# Patient Record
Sex: Female | Born: 1940
Health system: Southern US, Community
[De-identification: ages and names within clinical notes are randomized; demographics above are authoritative.]

## PROBLEM LIST (undated history)

## (undated) DIAGNOSIS — S22079A Unspecified fracture of T9-T10 vertebra, initial encounter for closed fracture: Secondary | ICD-10-CM

## (undated) DIAGNOSIS — I509 Heart failure, unspecified: Secondary | ICD-10-CM

## (undated) DIAGNOSIS — R0600 Dyspnea, unspecified: Secondary | ICD-10-CM

## (undated) DIAGNOSIS — C801 Malignant (primary) neoplasm, unspecified: Secondary | ICD-10-CM

## (undated) DIAGNOSIS — K219 Gastro-esophageal reflux disease without esophagitis: Secondary | ICD-10-CM

## (undated) DIAGNOSIS — I10 Essential (primary) hypertension: Secondary | ICD-10-CM

## (undated) DIAGNOSIS — D649 Anemia, unspecified: Secondary | ICD-10-CM

## (undated) DIAGNOSIS — I4891 Unspecified atrial fibrillation: Secondary | ICD-10-CM

## (undated) DIAGNOSIS — F419 Anxiety disorder, unspecified: Secondary | ICD-10-CM

## (undated) DIAGNOSIS — M1990 Unspecified osteoarthritis, unspecified site: Secondary | ICD-10-CM

## (undated) DIAGNOSIS — N189 Chronic kidney disease, unspecified: Secondary | ICD-10-CM

## (undated) HISTORY — PX: MASTECTOMY: SHX3

---

## 1993-03-26 HISTORY — PX: SEPTOPLASTY: SUR1290

## 1999-03-27 DIAGNOSIS — C50919 Malignant neoplasm of unspecified site of unspecified female breast: Secondary | ICD-10-CM

## 1999-03-27 HISTORY — DX: Malignant neoplasm of unspecified site of unspecified female breast: C50.919

## 2000-01-17 ENCOUNTER — Encounter: Admission: RE | Admit: 2000-01-17 | Discharge: 2000-01-17 | Payer: Self-pay | Admitting: Oncology

## 2000-01-17 ENCOUNTER — Encounter (HOSPITAL_COMMUNITY): Payer: Self-pay | Admitting: Oncology

## 2000-01-17 ENCOUNTER — Encounter (INDEPENDENT_AMBULATORY_CARE_PROVIDER_SITE_OTHER): Payer: Self-pay | Admitting: *Deleted

## 2000-01-17 ENCOUNTER — Other Ambulatory Visit: Admission: RE | Admit: 2000-01-17 | Discharge: 2000-01-17 | Payer: Self-pay | Admitting: Oncology

## 2000-01-31 ENCOUNTER — Inpatient Hospital Stay (HOSPITAL_COMMUNITY): Admission: RE | Admit: 2000-01-31 | Discharge: 2000-02-01 | Payer: Self-pay | Admitting: *Deleted

## 2000-01-31 ENCOUNTER — Encounter (INDEPENDENT_AMBULATORY_CARE_PROVIDER_SITE_OTHER): Payer: Self-pay | Admitting: *Deleted

## 2000-01-31 ENCOUNTER — Encounter: Payer: Self-pay | Admitting: *Deleted

## 2000-03-05 ENCOUNTER — Encounter: Payer: Self-pay | Admitting: *Deleted

## 2000-03-05 ENCOUNTER — Ambulatory Visit (HOSPITAL_COMMUNITY): Admission: RE | Admit: 2000-03-05 | Discharge: 2000-03-05 | Payer: Self-pay | Admitting: *Deleted

## 2000-04-17 ENCOUNTER — Encounter (HOSPITAL_COMMUNITY): Payer: Self-pay | Admitting: Oncology

## 2000-04-17 ENCOUNTER — Encounter: Admission: RE | Admit: 2000-04-17 | Discharge: 2000-04-17 | Payer: Self-pay | Admitting: Oncology

## 2000-07-03 ENCOUNTER — Encounter (HOSPITAL_COMMUNITY): Admission: RE | Admit: 2000-07-03 | Discharge: 2000-08-02 | Payer: Self-pay | Admitting: Oncology

## 2000-07-03 ENCOUNTER — Encounter: Admission: RE | Admit: 2000-07-03 | Discharge: 2000-07-03 | Payer: Self-pay | Admitting: Oncology

## 2000-08-02 ENCOUNTER — Ambulatory Visit (HOSPITAL_BASED_OUTPATIENT_CLINIC_OR_DEPARTMENT_OTHER): Admission: RE | Admit: 2000-08-02 | Discharge: 2000-08-02 | Payer: Self-pay | Admitting: *Deleted

## 2000-09-25 ENCOUNTER — Encounter: Admission: RE | Admit: 2000-09-25 | Discharge: 2000-09-25 | Payer: Self-pay | Admitting: Oncology

## 2000-09-25 ENCOUNTER — Encounter (HOSPITAL_COMMUNITY): Admission: RE | Admit: 2000-09-25 | Discharge: 2000-10-25 | Payer: Self-pay | Admitting: Oncology

## 2001-03-05 ENCOUNTER — Encounter (HOSPITAL_COMMUNITY): Admission: RE | Admit: 2001-03-05 | Discharge: 2001-04-04 | Payer: Self-pay | Admitting: Oncology

## 2001-03-05 ENCOUNTER — Encounter: Admission: RE | Admit: 2001-03-05 | Discharge: 2001-03-05 | Payer: Self-pay | Admitting: Oncology

## 2001-09-03 ENCOUNTER — Encounter (HOSPITAL_COMMUNITY): Admission: RE | Admit: 2001-09-03 | Discharge: 2001-10-03 | Payer: Self-pay | Admitting: Oncology

## 2001-09-03 ENCOUNTER — Encounter: Admission: RE | Admit: 2001-09-03 | Discharge: 2001-09-03 | Payer: Self-pay | Admitting: Family Medicine

## 2001-10-07 ENCOUNTER — Encounter: Admission: RE | Admit: 2001-10-07 | Discharge: 2001-10-07 | Payer: Self-pay | Admitting: Oncology

## 2002-03-11 ENCOUNTER — Encounter (HOSPITAL_COMMUNITY): Admission: RE | Admit: 2002-03-11 | Discharge: 2002-04-10 | Payer: Self-pay | Admitting: Oncology

## 2002-03-11 ENCOUNTER — Encounter: Admission: RE | Admit: 2002-03-11 | Discharge: 2002-03-11 | Payer: Self-pay | Admitting: Oncology

## 2002-04-28 ENCOUNTER — Ambulatory Visit (HOSPITAL_COMMUNITY): Admission: RE | Admit: 2002-04-28 | Discharge: 2002-04-28 | Payer: Self-pay | Admitting: Oncology

## 2002-04-28 ENCOUNTER — Encounter (HOSPITAL_COMMUNITY): Payer: Self-pay | Admitting: Oncology

## 2002-09-14 ENCOUNTER — Encounter (HOSPITAL_COMMUNITY): Admission: RE | Admit: 2002-09-14 | Discharge: 2002-10-14 | Payer: Self-pay | Admitting: Oncology

## 2002-09-14 ENCOUNTER — Encounter: Admission: RE | Admit: 2002-09-14 | Discharge: 2002-09-14 | Payer: Self-pay | Admitting: Oncology

## 2002-10-23 ENCOUNTER — Other Ambulatory Visit: Admission: RE | Admit: 2002-10-23 | Discharge: 2002-10-23 | Payer: Self-pay | Admitting: Obstetrics & Gynecology

## 2003-02-08 ENCOUNTER — Ambulatory Visit (HOSPITAL_COMMUNITY): Admission: RE | Admit: 2003-02-08 | Discharge: 2003-02-08 | Payer: Self-pay | Admitting: General Surgery

## 2003-03-16 ENCOUNTER — Encounter: Admission: RE | Admit: 2003-03-16 | Discharge: 2003-03-16 | Payer: Self-pay | Admitting: Oncology

## 2003-03-16 ENCOUNTER — Encounter (HOSPITAL_COMMUNITY): Admission: RE | Admit: 2003-03-16 | Discharge: 2003-04-15 | Payer: Self-pay | Admitting: Oncology

## 2003-10-12 ENCOUNTER — Ambulatory Visit (HOSPITAL_COMMUNITY): Admission: RE | Admit: 2003-10-12 | Discharge: 2003-10-12 | Payer: Self-pay | Admitting: Family Medicine

## 2003-10-25 ENCOUNTER — Encounter: Admission: RE | Admit: 2003-10-25 | Discharge: 2003-10-25 | Payer: Self-pay | Admitting: Oncology

## 2003-10-25 ENCOUNTER — Encounter (HOSPITAL_COMMUNITY): Admission: RE | Admit: 2003-10-25 | Discharge: 2003-11-24 | Payer: Self-pay | Admitting: Oncology

## 2004-01-10 ENCOUNTER — Ambulatory Visit (HOSPITAL_COMMUNITY): Admission: RE | Admit: 2004-01-10 | Discharge: 2004-01-10 | Payer: Self-pay | Admitting: Family Medicine

## 2004-05-17 ENCOUNTER — Encounter: Admission: RE | Admit: 2004-05-17 | Discharge: 2004-05-17 | Payer: Self-pay | Admitting: Oncology

## 2004-05-17 ENCOUNTER — Encounter (HOSPITAL_COMMUNITY): Admission: RE | Admit: 2004-05-17 | Discharge: 2004-06-16 | Payer: Self-pay | Admitting: Oncology

## 2004-05-17 ENCOUNTER — Ambulatory Visit (HOSPITAL_COMMUNITY): Payer: Self-pay | Admitting: Oncology

## 2004-07-31 ENCOUNTER — Ambulatory Visit (HOSPITAL_COMMUNITY): Admission: RE | Admit: 2004-07-31 | Discharge: 2004-07-31 | Payer: Self-pay | Admitting: Oncology

## 2004-11-15 ENCOUNTER — Encounter: Admission: RE | Admit: 2004-11-15 | Discharge: 2004-11-15 | Payer: Self-pay | Admitting: Oncology

## 2004-11-15 ENCOUNTER — Encounter (HOSPITAL_COMMUNITY): Admission: RE | Admit: 2004-11-15 | Discharge: 2004-12-15 | Payer: Self-pay | Admitting: Oncology

## 2004-11-15 ENCOUNTER — Ambulatory Visit (HOSPITAL_COMMUNITY): Payer: Self-pay | Admitting: Oncology

## 2005-06-22 ENCOUNTER — Ambulatory Visit (HOSPITAL_COMMUNITY): Payer: Self-pay | Admitting: Oncology

## 2005-06-22 ENCOUNTER — Encounter (HOSPITAL_COMMUNITY): Admission: RE | Admit: 2005-06-22 | Discharge: 2005-07-22 | Payer: Self-pay | Admitting: Oncology

## 2005-06-22 ENCOUNTER — Encounter: Admission: RE | Admit: 2005-06-22 | Discharge: 2005-06-22 | Payer: Self-pay | Admitting: Oncology

## 2005-12-03 ENCOUNTER — Emergency Department (HOSPITAL_COMMUNITY): Admission: EM | Admit: 2005-12-03 | Discharge: 2005-12-04 | Payer: Self-pay | Admitting: Emergency Medicine

## 2008-02-27 ENCOUNTER — Emergency Department (HOSPITAL_COMMUNITY): Admission: EM | Admit: 2008-02-27 | Discharge: 2008-02-28 | Payer: Self-pay | Admitting: Emergency Medicine

## 2009-03-28 ENCOUNTER — Ambulatory Visit (HOSPITAL_COMMUNITY): Admission: RE | Admit: 2009-03-28 | Discharge: 2009-03-28 | Payer: Self-pay | Admitting: Family Medicine

## 2009-04-07 ENCOUNTER — Ambulatory Visit (HOSPITAL_COMMUNITY): Admission: RE | Admit: 2009-04-07 | Discharge: 2009-04-07 | Payer: Self-pay | Admitting: Family Medicine

## 2010-04-16 ENCOUNTER — Encounter (HOSPITAL_COMMUNITY): Payer: Self-pay | Admitting: Oncology

## 2010-07-05 ENCOUNTER — Ambulatory Visit (HOSPITAL_COMMUNITY)
Admission: RE | Admit: 2010-07-05 | Discharge: 2010-07-05 | Disposition: A | Discharge status: Home / Self Care | Payer: Medicare HMO | Source: Ambulatory Visit | Attending: Family Medicine | Admitting: Family Medicine

## 2010-07-05 ENCOUNTER — Other Ambulatory Visit (HOSPITAL_COMMUNITY): Payer: Self-pay | Admitting: Family Medicine

## 2010-07-05 DIAGNOSIS — M25561 Pain in right knee: Secondary | ICD-10-CM

## 2010-07-05 DIAGNOSIS — M25559 Pain in unspecified hip: Secondary | ICD-10-CM | POA: Insufficient documentation

## 2010-07-05 DIAGNOSIS — M25551 Pain in right hip: Secondary | ICD-10-CM

## 2010-07-05 DIAGNOSIS — M25569 Pain in unspecified knee: Secondary | ICD-10-CM | POA: Insufficient documentation

## 2010-07-05 DIAGNOSIS — R937 Abnormal findings on diagnostic imaging of other parts of musculoskeletal system: Secondary | ICD-10-CM | POA: Insufficient documentation

## 2010-08-11 NOTE — H&P (Signed)
   NAME:  Bailey Bishop, BLAUSER NO.:  [id number]   MEDICAL RECORD NO.:  [id number]                   PATIENT TYPE:   LOCATION:                                       FACILITY:   PHYSICIAN:  Jamesetta So, M.D.               DATE OF BIRTH:  25-Jan-1941   DATE OF ADMISSION:  DATE OF DISCHARGE:                                HISTORY & PHYSICAL   CHIEF COMPLAINT:  Need for screening colonoscopy, epigastric pain, reflux  disease.   HISTORY OF PRESENT ILLNESS:  The patient is a 70 year old white female who  is referred for an endoscopic evaluation.  She needs a colonoscopy for  screening purposes.  She has never had a colonoscopy.  She has also been  having epigastric pain with a history of reflux disease.  No recent weight  loss, nausea, vomiting, diarrhea, constipation, melena, or hematochezia.  There is no family history of colon carcinoma.  There is no history of  hemorrhoidal disease.   PAST MEDICAL HISTORY:  Hypertension and breast cancer, asthma.   PAST SURGICAL HISTORY:  Bilateral mastectomy in 2001.   CURRENT MEDICATIONS:  Vaseretic one tablet p.o. daily, Armidex, Allegra,  Nexium.   ALLERGIES:  No known drug allergies.   REVIEW OF SYSTEMS:  Noncontributory.   PHYSICAL EXAMINATION:  GENERAL:  The patient is a well-developed, well-  nourished white female in no acute distress.  VITAL SIGNS:  She is afebrile and vital signs are stable.  LUNGS:  Reveal some expiratory wheezing.  No rales or rhonchi are noted.  HEART:  Reveals a regular rate and rhythm without S3, S4, or murmurs.  ABDOMEN:  Soft, nontender, nondistended.  No hepatosplenomegaly, masses, or  hernias are identified.  RECTAL:  Deferred to the procedure.   IMPRESSION:  Need for screening colonoscopy, epigastric pain, reflux  disease.    PLAN:  The patient is scheduled for colonoscopy and EGD on February 08, 2003.  The risks and benefits of the procedures including bleeding and  perforation were fully explained to the patient who gave informed consent.     ___________________________________________                                         Jamesetta So, M.D.   MAJ/MEDQ  D:  02/04/2003  T:  02/04/2003  Job:  [id number]   cc:   Bonne Dolores, M.D.  [address], [place]  [place] [address]  Fax: [phone]   Gaston Islam. Neijstrom, MD  [address]  [place]  [place] [address]  Fax: [phone]

## 2010-08-11 NOTE — Op Note (Signed)
[place]  Patient:    Bailey Bishop, Bailey Bishop                   MRN: [id number] Proc. Date: 01/31/00 Adm. Date:  [id number] Attending:  Lucianne Muss CC:         Dr. Marco Collie   Operative Report  CCS [phone].  PREOPERATIVE DIAGNOSES: 1. Lobular carcinoma in situ, left breast. 2. Invasive lobular carcinoma, right breast.  POSTOPERATIVE DIAGNOSES: 1. Lobular carcinoma in situ, left breast. 2. Invasive lobular carcinoma, right breast.  PROCEDURE:  Bilateral total mastectomies with right sentinel lymph node dissection.  SURGEON:  Marcello Moores B. March Rummage, M.D.  ASSISTANT:  Orson Ape. Rise Patience, M.D.  ANESTHESIA:  General.  ANESTHESIOLOGIST:  Ala Dach, M.D., and C.R.N.A.  DESCRIPTION OF PROCEDURE:  The patient was taken to the operating room and placed on the table in supine position and after satisfactory general anesthetic with intubation, a Foley catheter was inserted and the patient was then prepped bilaterally, including both breasts, axillae, the neck, and upper abdomen.  The right breast was operated upon first.  Prior to prepping the patient, 5 cc of isosulfan blue had been injected into the upper outer quadrant of the right breast.  It had been massaged for about five minutes. Using the gamma probe, a hot area in the right axilla was found.  A transverse incision was outlined with the skin marker and the incision made through the skin and subcutaneous tissue.  A hot blue node was identified and dissected free.  Some small hemoclips were used.  A probe was placed back into the wound, and there was a second hot node, which was deeper.  This was dissected out and was also hot and blue.  After the second node was removed, the axilla counts were essentially 0.  Bleeding was controlled.  This wound was then just packed open.  The two sentinel lymph nodes were sent for imprints, and the pathologist returned a report that both were  negative for metastatic disease. Transverse elliptical incision had been outlined with a skin marker beginning about 4 cm from the midline.  This included the nipple and areola and came to the anterior axillary line.  Skin flaps were then developed superiorly to the sternum, superiorly to the clavicle, inferior to the fascia overlying the rectus muscle, and laterally to the latissimus dorsi muscle.  The breast was then removed, leaving the pectoralis major and minor muscles.  No attempt was made to remove any right axillary lymph nodes, although there was at least one node that I saw with the specimen.  Bleeding was controlled with the cautery or with suture ligatures of 3-0 Vicryl or with 3-0 black silk ties.  Two #19 Blake drains were placed through separate stab wounds inferior to the transverse incision.  One was placed toward the axilla, the other over the chest medially.  Subcutaneous tissue was closed with interrupted sutures of 3-0 Vicryl.  Skin was closed with skin staples.  The patient had two incisions.  The axillary incision appeared to be well enough away from the breast incision so that the blood supply was adequate.  The drains were sutured in with 3-0 nylon.  After completing the right breast, a tie was placed over the right breast, and the surgeons changed gowns and gloves, and then we proceeded to operate on the left side.  The patient had previous incisions in the upper portion of the left breast.  She  had slight hypertrophy of these two scars, and the transverse elliptical incision was made above these to remove these two scars.  Again, the incision began about 4 cm from the sternum and extended around the nipple and areola and the two previous scars, to the anterior axillary line.  Flaps again were then developed to the sternum, clavicle, fascia overlying the rectus muscle, and laterally to the latissimus dorsi muscle.  The breast was removed, leaving the pectoralis  major and minor muscles.  Bleeding was controlled with the cautery with 3-0 black silk sutures.  No attempt was made to dissect the left axilla.  The wound was copiously irrigated, hemostasis obtained.  Again two #19 Blake drains were placed, one toward the axilla, one over the chest medially.  These were sutured to the skin using 3-0 nylon.  Subcutaneous tissue closed with interrupted sutures of 3-0 Vicryl, and the skin was closed with skin staples. All drains were connected to bulb suction.  A piece of Vaseline gauze was placed over the suture line, around the four drain sites.  The dressing was completed using 4 x 4s, ABDs, and four-inch Hypafix.  The patient seemed to tolerate the procedure well and was taken to the PACU in satisfactory condition.  The pathologist did weigh the breasts for [place], and the right breast weighed 1067 g, and the left breast weighed 1113 g. DD:  01/31/00 TD:  02/01/00 Job: [id number]

## 2010-08-11 NOTE — Op Note (Signed)
[facility]  Patient:    Bailey Bishop, Bailey Bishop                   MRN: [id number] Proc. Date: 03/05/00 Adm. Date:  [id number] Attending:  Lucianne Muss CC:         Bonne Dolores, MD  Gaston Islam. Tressie Stalker, M.D.   Operative Report  CCS NUMBER:  [id number]  PREOPERATIVE DIAGNOSIS:  Carcinoma of the breast, needs Port-A-Cath for IV access.  POSTOPERATIVE DIAGNOSIS:  Carcinoma of the breast, needs Port-A-Cath for IV access.  OPERATION:  Insertion of Life Port catheter via left subclavian vein.  SURGEON: Marcello Moores B. March Rummage, M.D.  ANESTHESIA:  1% Xylocaine local with anesthesia monitoring.  ANESTHESIOLOGIST:  Freddie Apley, M.D. and C.R.N.A.  DESCRIPTION OF PROCEDURE:  The patient was taken to the operating room and placed on the operating table in the supine position.  A rolled up blanket was placed vertically along the thoracic spine.  The chest area was prepped bilaterally.  The patient had previous bilateral mastectomies.  1% Xylocaine local was used to infiltrate the skin beneath the left clavicle.  With the patient in the Trendelenburg position, the 18 gauge spinal needle was used to enter the subclavian vein.  Initially, the guide wire wanted to go cephalad but the guidewire eventually went caudad.  The needle was then removed leaving the guidewire.  A small area of the left chest was anesthetized and a transverse incision made and a pocket created inferior to this for the [employer].  A tunnel was created from this site to the area of the subclavian stick and the Life Port capsule was passed through this tunnel. The dilator was then placed over the guidewire and the dilator and peel away sheath were placed over the guidewire and then the guidewire and dilator were removed just leaving the peel away sheath.  The catheter had been filled with heparinized saline and this was then placed into the peel-away sheath and easily inserted.  The  peel-away sheath was then removed and using fluoroscopy, the tube which was too far caudad was brought back into the area of the superior vena cava.  The reservoir had been filled with heparinized saline and the catheter was then cut into the proper length and attached to the reservoir using the bayonet attachments.  The reservoir was sutured into the subcutaneous pocket with three sutures of 3-0 Vicryl.  The system aspirated easily and irrigated easily.  Fluoroscopy was used a final time to check the position of the tube and to be sure there were no kinks in the system and things looked in good position.  The wound was then closed with interrupted 3-0 Vicryl subcutaneous sutures and the skin was closed with running subcuticular sutures of 4-0 Vicryl.  A single 4-0 Vicryl suture was placed at the infraclavicular site.  Benzoin and 1/2 inch Steri-Strips were used to reinforce the skin closure.  The right angle Huber needle with the extension was then placed through the skin into the reservoir.  The entire system aspirated easily and the system was filled with 5 cc of heparin 100 units per cc.  Dry sterile dressing was applied.  The patient tolerated the procedure well and was taken to the post anesthesia care unit in satisfactory condition. DD:  03/05/00 TD:  03/05/00 Job: [id number]

## 2010-12-28 LAB — BASIC METABOLIC PANEL
BUN: 13 mg/dL (ref 6–23)
CO2: 32 mEq/L (ref 19–32)
Creatinine, Ser: 1.06 mg/dL (ref 0.4–1.2)
GFR calc Af Amer: >60 mL/min (ref >60)
GFR calc non Af Amer: 51 mL/min — ABNORMAL LOW (ref >60)
Glucose, Bld: 127 mg/dL — ABNORMAL HIGH (ref 70–99)
Sodium: 140 mEq/L (ref 135–145)

## 2011-01-29 ENCOUNTER — Other Ambulatory Visit (HOSPITAL_COMMUNITY): Payer: Self-pay | Admitting: Internal Medicine

## 2011-01-29 DIAGNOSIS — Z139 Encounter for screening, unspecified: Secondary | ICD-10-CM

## 2011-04-09 ENCOUNTER — Other Ambulatory Visit (HOSPITAL_COMMUNITY): Payer: Medicare HMO

## 2011-04-11 ENCOUNTER — Other Ambulatory Visit (HOSPITAL_COMMUNITY): Payer: Medicare HMO

## 2011-04-18 ENCOUNTER — Ambulatory Visit (HOSPITAL_COMMUNITY)
Admission: RE | Admit: 2011-04-18 | Discharge: 2011-04-18 | Disposition: A | Discharge status: Home / Self Care | Payer: Medicare HMO | Source: Ambulatory Visit | Attending: Internal Medicine | Admitting: Internal Medicine

## 2011-04-18 DIAGNOSIS — Z139 Encounter for screening, unspecified: Secondary | ICD-10-CM

## 2011-04-18 DIAGNOSIS — M899 Disorder of bone, unspecified: Secondary | ICD-10-CM | POA: Insufficient documentation

## 2011-04-18 DIAGNOSIS — M949 Disorder of cartilage, unspecified: Secondary | ICD-10-CM | POA: Insufficient documentation

## 2011-04-18 DIAGNOSIS — Z13820 Encounter for screening for osteoporosis: Secondary | ICD-10-CM | POA: Insufficient documentation

## 2011-07-16 ENCOUNTER — Encounter (HOSPITAL_COMMUNITY): Payer: Self-pay | Admitting: Pharmacy Technician

## 2011-07-16 NOTE — Patient Instructions (Addendum)
[place]  07/16/2011   Your procedure is scheduled on:  07/23/2011   Report to [name] at  800  AM.  Call this number if you have problems the morning of surgery: [phone]   Remember:   Do not eat food:After Midnight.  May have clear liquids:until Midnight .  Clear liquids include soda, tea, black coffee, apple or grape juice, broth.  Take these medicines the morning of surgery with A SIP OF WATER:  Verapamil,hyzaar,xanax. Take flonase before you come.   Do not wear jewelry, make-up or nail polish.  Do not wear lotions, powders, or perfumes. You may wear deodorant.  Do not shave 48 hours prior to surgery.  Do not bring valuables to the hospital.  Contacts, dentures or bridgework may not be worn into surgery.  Leave suitcase in the car. After surgery it may be brought to your room.  For patients admitted to the hospital, checkout time is 11:00 AM the day of discharge.   Patients discharged the day of surgery will not be allowed to drive home.  Name and phone number of your driver: family  Special Instructions: N/A   Please read over the following fact sheets that you were given: Pain Booklet, Surgical Site Infection Prevention, Anesthesia Post-op Instructions and Care and Recovery After Surgery Cataract A cataract is a clouding of the lens of the eye. When a lens becomes cloudy, vision is reduced based on the degree and nature of the clouding. Many cataracts reduce vision to some degree. Some cataracts make people more near-sighted as they develop. Other cataracts increase glare. Cataracts that are ignored and become worse can sometimes look white. The white color can be seen through the pupil. CAUSES   Aging. However, cataracts may occur at any age, even in newborns.   Certain drugs.   Trauma to the eye.   Certain diseases such as diabetes.   Specific eye diseases such as chronic inflammation inside the eye or a sudden attack of a rare form of glaucoma.    Inherited or acquired medical problems.  SYMPTOMS   Gradual, progressive drop in vision in the affected eye.   Severe, rapid visual loss. This most often happens when trauma is the cause.  DIAGNOSIS  To detect a cataract, an eye doctor examines the lens. Cataracts are best diagnosed with an exam of the eyes with the pupils enlarged (dilated) by drops.  TREATMENT  For an early cataract, vision may improve by using different eyeglasses or stronger lighting. If that does not help your vision, surgery is the only effective treatment. A cataract needs to be surgically removed when vision loss interferes with your everyday activities, such as driving, reading, or watching TV. A cataract may also have to be removed if it prevents examination or treatment of another eye problem. Surgery removes the cloudy lens and usually replaces it with a substitute lens (intraocular lens, IOL).  At a time when both you and your doctor agree, the cataract will be surgically removed. If you have cataracts in both eyes, only one is usually removed at a time. This allows the operated eye to heal and be out of danger from any possible problems after surgery (such as infection or poor wound healing). In rare cases, a cataract may be doing damage to your eye. In these cases, your caregiver may advise surgical removal right away. The vast majority of people who have cataract surgery have better vision afterward. HOME CARE INSTRUCTIONS  If you are  not planning surgery, you may be asked to do the following:  Use different eyeglasses.   Use stronger or brighter lighting.   Ask your eye doctor about reducing your medicine dose or changing medicines if it is thought that a medicine caused your cataract. Changing medicines does not make the cataract go away on its own.   Become familiar with your surroundings. Poor vision can lead to injury. Avoid bumping into things on the affected side. You are at a higher risk for tripping  or falling.   Exercise extreme care when driving or operating machinery.   Wear sunglasses if you are sensitive to bright light or experiencing problems with glare.  SEEK IMMEDIATE MEDICAL CARE IF:   You have a worsening or sudden vision loss.   You notice redness, swelling, or increasing pain in the eye.   You have a fever.  Document Released: 03/12/2005 Document Revised: 03/01/2011 Document Reviewed: 11/03/2010 [facility] Patient Information 2012 [place].PATIENT INSTRUCTIONS POST-ANESTHESIA  IMMEDIATELY FOLLOWING SURGERY:  Do not drive or operate machinery for the first twenty four hours after surgery.  Do not make any important decisions for twenty four hours after surgery or while taking narcotic pain medications or sedatives.  If you develop intractable nausea and vomiting or a severe headache please notify your doctor immediately.  FOLLOW-UP:  Please make an appointment with your surgeon as instructed. You do not need to follow up with anesthesia unless specifically instructed to do so.  WOUND CARE INSTRUCTIONS (if applicable):  Keep a dry clean dressing on the anesthesia/puncture wound site if there is drainage.  Once the wound has quit draining you may leave it open to air.  Generally you should leave the bandage intact for twenty four hours unless there is drainage.  If the epidural site drains for more than 36-48 hours please call the anesthesia department.  QUESTIONS?:  Please feel free to call your physician or the hospital operator if you have any questions, and they will be happy to assist you.     [place] [phone]

## 2011-07-17 ENCOUNTER — Other Ambulatory Visit: Payer: Self-pay

## 2011-07-17 ENCOUNTER — Encounter (HOSPITAL_COMMUNITY)
Admission: RE | Admit: 2011-07-17 | Discharge: 2011-07-17 | Disposition: A | Discharge status: Home / Self Care | Payer: Medicare HMO | Source: Ambulatory Visit | Attending: Ophthalmology | Admitting: Ophthalmology

## 2011-07-17 ENCOUNTER — Encounter (HOSPITAL_COMMUNITY): Payer: Self-pay

## 2011-07-17 HISTORY — DX: Anxiety disorder, unspecified: F41.9

## 2011-07-17 HISTORY — DX: Essential (primary) hypertension: I10

## 2011-07-17 HISTORY — DX: Gastro-esophageal reflux disease without esophagitis: K21.9

## 2011-07-17 HISTORY — DX: Malignant (primary) neoplasm, unspecified: C80.1

## 2011-07-17 HISTORY — DX: Unspecified osteoarthritis, unspecified site: M19.90

## 2011-07-17 LAB — BASIC METABOLIC PANEL WITH GFR
BUN: 12 mg/dL (ref 6–23)
CO2: 30 meq/L (ref 19–32)
Calcium: 10 mg/dL (ref 8.4–10.5)
Chloride: 100 meq/L (ref 96–112)
Creatinine, Ser: 1.02 mg/dL (ref 0.50–1.10)
GFR calc Af Amer: 62 mL/min — ABNORMAL LOW
GFR calc non Af Amer: 54 mL/min — ABNORMAL LOW
Glucose, Bld: 104 mg/dL — ABNORMAL HIGH (ref 70–99)
Potassium: 3.9 meq/L (ref 3.5–5.1)
Sodium: 139 meq/L (ref 135–145)

## 2011-07-17 LAB — HEMOGLOBIN AND HEMATOCRIT, BLOOD
HCT: 41.3 % (ref 36.0–46.0)
Hemoglobin: 13.6 g/dL (ref 12.0–15.0)

## 2011-07-17 MED ORDER — CYCLOPENTOLATE-PHENYLEPHRINE 0.2-1 % OP SOLN
OPHTHALMIC | Status: AC
  Filled 2011-07-17: qty 2

## 2011-07-23 ENCOUNTER — Encounter (HOSPITAL_COMMUNITY): Payer: Self-pay | Admitting: Anesthesiology

## 2011-07-23 ENCOUNTER — Ambulatory Visit (HOSPITAL_COMMUNITY)
Admission: RE | Admit: 2011-07-23 | Discharge: 2011-07-23 | Disposition: A | Discharge status: Home / Self Care | Payer: Medicare HMO | Source: Ambulatory Visit | Attending: Ophthalmology | Admitting: Ophthalmology

## 2011-07-23 ENCOUNTER — Encounter (HOSPITAL_COMMUNITY): Payer: Self-pay | Admitting: *Deleted

## 2011-07-23 ENCOUNTER — Encounter (HOSPITAL_COMMUNITY)
Admission: RE | Disposition: A | Discharge status: Home / Self Care | Payer: Self-pay | Source: Ambulatory Visit | Attending: Ophthalmology

## 2011-07-23 ENCOUNTER — Ambulatory Visit (HOSPITAL_COMMUNITY): Payer: Medicare HMO | Admitting: Anesthesiology

## 2011-07-23 DIAGNOSIS — I10 Essential (primary) hypertension: Secondary | ICD-10-CM | POA: Insufficient documentation

## 2011-07-23 DIAGNOSIS — Z79899 Other long term (current) drug therapy: Secondary | ICD-10-CM | POA: Insufficient documentation

## 2011-07-23 DIAGNOSIS — H2510 Age-related nuclear cataract, unspecified eye: Secondary | ICD-10-CM | POA: Insufficient documentation

## 2011-07-23 DIAGNOSIS — Z01812 Encounter for preprocedural laboratory examination: Secondary | ICD-10-CM | POA: Insufficient documentation

## 2011-07-23 DIAGNOSIS — Z01810 Encounter for preprocedural cardiovascular examination: Secondary | ICD-10-CM | POA: Insufficient documentation

## 2011-07-23 HISTORY — PX: CATARACT EXTRACTION W/PHACO: SHX586

## 2011-07-23 SURGERY — PHACOEMULSIFICATION, CATARACT, WITH IOL INSERTION
Anesthesia: Monitor Anesthesia Care | Site: Eye | Laterality: Right | Wound class: Clean

## 2011-07-23 MED ORDER — MIDAZOLAM HCL 2 MG/2ML IJ SOLN
1.0000 mg | INTRAMUSCULAR | Status: DC | PRN
  Administered 2011-07-23: 2 mg via INTRAVENOUS

## 2011-07-23 MED ORDER — CYCLOPENTOLATE-PHENYLEPHRINE 0.2-1 % OP SOLN
1.0000 [drp] | OPHTHALMIC | Status: AC
  Administered 2011-07-23: 1 [drp] via OPHTHALMIC

## 2011-07-23 MED ORDER — MIDAZOLAM HCL 2 MG/2ML IJ SOLN
INTRAMUSCULAR | Status: AC
  Administered 2011-07-23: 2 mg via INTRAVENOUS
  Filled 2011-07-23: qty 2

## 2011-07-23 MED ORDER — MIDAZOLAM HCL 2 MG/2ML IJ SOLN
INTRAMUSCULAR | Status: AC
  Filled 2011-07-23: qty 2

## 2011-07-23 MED ORDER — TETRACAINE HCL 0.5 % OP SOLN
OPHTHALMIC | Status: AC
  Administered 2011-07-23: 1 [drp]
  Filled 2011-07-23: qty 2

## 2011-07-23 MED ORDER — LIDOCAINE HCL 3.5 % OP GEL
OPHTHALMIC | Status: AC
  Filled 2011-07-23: qty 5

## 2011-07-23 MED ORDER — NA HYALUR & NA CHOND-NA HYALUR 0.55-0.5 ML IO KIT
PACK | INTRAOCULAR | Status: DC | PRN
  Administered 2011-07-23: 1 via OPHTHALMIC

## 2011-07-23 MED ORDER — LIDOCAINE 3.5 % OP GEL OPTIME - NO CHARGE
OPHTHALMIC | Status: DC | PRN
  Administered 2011-07-23: 1 [drp] via OPHTHALMIC

## 2011-07-23 MED ORDER — KETOROLAC TROMETHAMINE 0.4 % OP SOLN - NO CHARGE
1.0000 [drp] | Freq: Once | OPHTHALMIC | Status: AC
  Administered 2011-07-23: 1 [drp] via OPHTHALMIC
  Filled 2011-07-23: qty 5

## 2011-07-23 MED ORDER — MOXIFLOXACIN HCL 0.5 % OP SOLN - NO CHARGE
1.0000 [drp] | Freq: Once | OPHTHALMIC | Status: AC
  Administered 2011-07-23: 1 [drp] via OPHTHALMIC
  Filled 2011-07-23: qty 3

## 2011-07-23 MED ORDER — LACTATED RINGERS IV SOLN
INTRAVENOUS | Status: DC
  Administered 2011-07-23: 1000 mL via INTRAVENOUS

## 2011-07-23 MED ORDER — GATIFLOXACIN 0.5 % OP SOLN OPTIME - NO CHARGE
OPHTHALMIC | Status: DC | PRN
  Administered 2011-07-23: 1 [drp] via OPHTHALMIC

## 2011-07-23 MED ORDER — GATIFLOXACIN 0.5 % OP SOLN OPTIME - NO CHARGE
1.0000 [drp] | Freq: Once | OPHTHALMIC | Status: DC
  Filled 2011-07-23: qty 2.5

## 2011-07-23 MED ORDER — TETRACAINE 0.5 % OP SOLN OPTIME - NO CHARGE
OPHTHALMIC | Status: DC | PRN
  Administered 2011-07-23: 1 [drp] via OPHTHALMIC

## 2011-07-23 MED ORDER — LACTATED RINGERS IV SOLN
INTRAVENOUS | Status: DC | PRN
  Administered 2011-07-23: 08:00:00 via INTRAVENOUS

## 2011-07-23 MED ORDER — BSS IO SOLN
INTRAOCULAR | Status: DC | PRN
  Administered 2011-07-23: 15 mL via INTRAOCULAR

## 2011-07-23 MED ORDER — MIDAZOLAM HCL 5 MG/5ML IJ SOLN
INTRAMUSCULAR | Status: DC | PRN
  Administered 2011-07-23: 2 mg via INTRAVENOUS

## 2011-07-23 MED ORDER — LIDOCAINE HCL 3.5 % OP GEL: 1.0000 "application " | Freq: Once | OPHTHALMIC | Status: DC

## 2011-07-23 MED ORDER — EPINEPHRINE HCL 1 MG/ML IJ SOLN
INTRAOCULAR | Status: DC | PRN
  Administered 2011-07-23: 10:00:00

## 2011-07-23 SURGICAL SUPPLY — 27 items
CAPSULAR TENSION RING-AMO (OPHTHALMIC RELATED) IMPLANT
CLOTH BEACON ORANGE TIMEOUT ST (SAFETY) ×1 IMPLANT
GLOVE BIO SURGEON STRL SZ7.5 (GLOVE) IMPLANT
GLOVE BIOGEL M 6.5 STRL (GLOVE) IMPLANT
GLOVE BIOGEL PI IND STRL 6.5 (GLOVE) IMPLANT
GLOVE BIOGEL PI IND STRL 7.0 (GLOVE) IMPLANT
GLOVE BIOGEL PI INDICATOR 6.5 (GLOVE)
GLOVE BIOGEL PI INDICATOR 7.0 (GLOVE) ×1
GLOVE ECLIPSE 6.5 STRL STRAW (GLOVE) IMPLANT
GLOVE ECLIPSE 7.5 STRL STRAW (GLOVE) IMPLANT
GLOVE EXAM NITRILE LRG STRL (GLOVE) IMPLANT
GLOVE EXAM NITRILE MD LF STRL (GLOVE) ×1 IMPLANT
GLOVE SKINSENSE NS SZ6.5 (GLOVE)
GLOVE SKINSENSE NS SZ7.0 (GLOVE)
GLOVE SKINSENSE STRL SZ6.5 (GLOVE) IMPLANT
GLOVE SKINSENSE STRL SZ7.0 (GLOVE) IMPLANT
INST SET CATARACT ~~LOC~~ (KITS) ×2 IMPLANT
KIT VITRECTOMY (OPHTHALMIC RELATED) IMPLANT
PAD ARMBOARD 7.5X6 YLW CONV (MISCELLANEOUS) ×1 IMPLANT
PROC W NO LENS (INTRAOCULAR LENS)
PROC W SPEC LENS (INTRAOCULAR LENS)
PROCESS W NO LENS (INTRAOCULAR LENS) IMPLANT
PROCESS W SPEC LENS (INTRAOCULAR LENS) IMPLANT
RING MALYGIN (MISCELLANEOUS) IMPLANT
SIGHTPATH CAT PROC W REG LENS (Ophthalmic Related) ×2 IMPLANT
VISCOELASTIC ADDITIONAL (OPHTHALMIC RELATED) IMPLANT
WATER STERILE IRR 250ML POUR (IV SOLUTION) ×1 IMPLANT

## 2011-07-23 NOTE — Anesthesia Procedure Notes (Signed)
Procedure Name: MAC Date/Time: 07/23/2011 9:55 AM Performed by: Antony Contras, Wolf Boulay L Pre-anesthesia Checklist: Patient identified, Patient being monitored, Emergency Drugs available, Timeout performed and Suction available Oxygen Delivery Method: Nasal cannula

## 2011-07-23 NOTE — Brief Op Note (Signed)
07/23/2011  10:56 AM  PATIENT:  Bailey Bishop  71 y.o. female  PRE-OPERATIVE DIAGNOSIS:  nuclear cataract left eye  POST-OPERATIVE DIAGNOSIS:  nuclear cataract left eye  PROCEDURE:  Procedure(s): CATARACT EXTRACTION PHACO AND INTRAOCULAR LENS PLACEMENT (IOC)  SURGEON:  Surgeon(s): Williams Che, MD  ASSISTANTS:  Magdalene Patricia, CST   ANESTHESIA STAFF: Jule Economy, CRNA - CRNA Lerry Liner, MD - Anesthesiologist  ANESTHESIA:   topical and MAC  REQUESTED LENS POWER: 19.5  LENS IMPLANT INFORMATION: [place] SN60 WF s/n [id number] exp05/2017  CUMULATIVE DISSIPATED ENERGY:9.96  INDICATIONS:see H&P  OP FINDINGS:dense NS  COMPLICATIONS:None  DICTATION #: see scanned op note  PLAN OF CARE: see pt insrtructions  PATIENT DISPOSITION:  Short Stay

## 2011-07-23 NOTE — Anesthesia Postprocedure Evaluation (Signed)
  Anesthesia Post-op Note  Patient: Bailey Bishop  Procedure(s) Performed: Procedure(s) (LRB): CATARACT EXTRACTION PHACO AND INTRAOCULAR LENS PLACEMENT (IOC) (Right)  Patient Location: PACU  Anesthesia Type: MAC  Level of Consciousness: awake, alert , oriented and patient cooperative  Airway and Oxygen Therapy: Patient Spontanous Breathing  Post-op Pain: none  Post-op Assessment: Post-op Vital signs reviewed, Patient's Cardiovascular Status Stable, PATIENT'S CARDIOVASCULAR STATUS UNSTABLE, Patent Airway and No signs of Nausea or vomiting  Post-op Vital Signs: Reviewed and stable  Complications: No apparent anesthesia complications

## 2011-07-23 NOTE — Anesthesia Preprocedure Evaluation (Addendum)
Anesthesia Evaluation  Patient identified by MRN, date of birth, ID band Patient awake    Reviewed: Allergy & Precautions, H&P , NPO status , Patient's Chart, lab work & pertinent test results  History of Anesthesia Complications Negative for: history of anesthetic complications  Airway Mallampati: II      Dental  (+) Edentulous Upper and Partial Lower   Pulmonary shortness of breath and with exertion, asthma ,  breath sounds clear to auscultation        Cardiovascular hypertension, Pt. on medications Rhythm:Regular Rate:Normal     Neuro/Psych Anxiety    GI/Hepatic hiatal hernia, GERD-  Medicated and Controlled,  Endo/Other    Renal/GU      Musculoskeletal   Abdominal   Peds  Hematology   Anesthesia Other Findings   Reproductive/Obstetrics                           Anesthesia Physical Anesthesia Plan  ASA: II  Anesthesia Plan: MAC   Post-op Pain Management:    Induction: Intravenous  Airway Management Planned: Nasal Cannula  Additional Equipment:   Intra-op Plan:   Post-operative Plan:   Informed Consent: I have reviewed the patients History and Physical, chart, labs and discussed the procedure including the risks, benefits and alternatives for the proposed anesthesia with the patient or authorized representative who has indicated his/her understanding and acceptance.     Plan Discussed with:   Anesthesia Plan Comments:         Anesthesia Quick Evaluation

## 2011-07-23 NOTE — H&P (Signed)
I have reviewed the pre printed H&P, the patient was re-examined, and I have identified no significant interval changes in the patient's medical condition.  There is no change in the plan of care since the history and physical of record. 

## 2011-07-23 NOTE — Preoperative (Signed)
Beta Blockers   Reason not to administer Beta Blockers:Not Applicable 

## 2011-07-23 NOTE — Op Note (Signed)
See scanned op note done today in another system

## 2011-07-23 NOTE — Transfer of Care (Signed)
Immediate Anesthesia Transfer of Care Note  Patient: Bailey Bishop  Procedure(s) Performed: Procedure(s) (LRB): CATARACT EXTRACTION PHACO AND INTRAOCULAR LENS PLACEMENT (IOC) (Right)  Patient Location: PACU  Anesthesia Type: MAC  Level of Consciousness: awake, alert , oriented and patient cooperative  Airway & Oxygen Therapy: Patient Spontanous Breathing  Post-op Assessment: Report given to PACU RN and Post -op Vital signs reviewed and stable  Post vital signs: Reviewed and stable  Complications: No apparent anesthesia complications

## 2011-07-25 ENCOUNTER — Encounter (HOSPITAL_COMMUNITY): Payer: Self-pay | Admitting: Ophthalmology

## 2011-08-15 ENCOUNTER — Encounter (INDEPENDENT_AMBULATORY_CARE_PROVIDER_SITE_OTHER): Payer: Self-pay | Admitting: *Deleted

## 2012-01-14 ENCOUNTER — Ambulatory Visit (HOSPITAL_COMMUNITY)
Admission: RE | Admit: 2012-01-14 | Discharge: 2012-01-14 | Disposition: A | Discharge status: Home / Self Care | Payer: Medicare HMO | Source: Ambulatory Visit | Attending: Physician Assistant | Admitting: Physician Assistant

## 2012-01-14 ENCOUNTER — Other Ambulatory Visit (HOSPITAL_COMMUNITY): Payer: Self-pay | Admitting: Physician Assistant

## 2012-01-14 DIAGNOSIS — M899 Disorder of bone, unspecified: Secondary | ICD-10-CM | POA: Insufficient documentation

## 2012-01-14 DIAGNOSIS — M949 Disorder of cartilage, unspecified: Secondary | ICD-10-CM | POA: Insufficient documentation

## 2012-01-14 DIAGNOSIS — M79609 Pain in unspecified limb: Secondary | ICD-10-CM | POA: Insufficient documentation

## 2012-01-14 DIAGNOSIS — M25569 Pain in unspecified knee: Secondary | ICD-10-CM

## 2012-04-04 ENCOUNTER — Ambulatory Visit (HOSPITAL_COMMUNITY)
Admission: RE | Admit: 2012-04-04 | Discharge: 2012-04-04 | Disposition: A | Discharge status: Home / Self Care | Payer: Medicare HMO | Source: Ambulatory Visit | Attending: Internal Medicine | Admitting: Internal Medicine

## 2012-04-04 ENCOUNTER — Other Ambulatory Visit (HOSPITAL_COMMUNITY): Payer: Self-pay | Admitting: Internal Medicine

## 2012-04-04 DIAGNOSIS — R05 Cough: Secondary | ICD-10-CM

## 2012-04-04 DIAGNOSIS — R0602 Shortness of breath: Secondary | ICD-10-CM | POA: Insufficient documentation

## 2012-04-04 DIAGNOSIS — R059 Cough, unspecified: Secondary | ICD-10-CM | POA: Insufficient documentation

## 2013-12-16 ENCOUNTER — Other Ambulatory Visit (HOSPITAL_COMMUNITY): Payer: Self-pay | Admitting: Physician Assistant

## 2013-12-16 ENCOUNTER — Ambulatory Visit (HOSPITAL_COMMUNITY)
Admission: RE | Admit: 2013-12-16 | Discharge: 2013-12-16 | Disposition: A | Discharge status: Home / Self Care | Payer: Medicare HMO | Source: Ambulatory Visit | Attending: Physician Assistant | Admitting: Physician Assistant

## 2013-12-16 DIAGNOSIS — I998 Other disorder of circulatory system: Secondary | ICD-10-CM | POA: Diagnosis not present

## 2013-12-16 DIAGNOSIS — M47817 Spondylosis without myelopathy or radiculopathy, lumbosacral region: Secondary | ICD-10-CM | POA: Diagnosis not present

## 2013-12-16 DIAGNOSIS — M25551 Pain in right hip: Secondary | ICD-10-CM

## 2013-12-16 DIAGNOSIS — M169 Osteoarthritis of hip, unspecified: Secondary | ICD-10-CM | POA: Diagnosis not present

## 2013-12-16 DIAGNOSIS — M1610 Unilateral primary osteoarthritis, unspecified hip: Secondary | ICD-10-CM | POA: Insufficient documentation

## 2013-12-16 DIAGNOSIS — M25559 Pain in unspecified hip: Secondary | ICD-10-CM | POA: Diagnosis present

## 2014-07-07 ENCOUNTER — Encounter (HOSPITAL_COMMUNITY): Payer: Self-pay | Admitting: *Deleted

## 2014-07-07 ENCOUNTER — Inpatient Hospital Stay (HOSPITAL_COMMUNITY)
Admission: EM | Admit: 2014-07-07 | Discharge: 2014-07-09 | DRG: 203 | Disposition: A | Discharge status: Home / Self Care | Payer: Commercial Managed Care - HMO | Attending: Internal Medicine | Admitting: Internal Medicine

## 2014-07-07 ENCOUNTER — Emergency Department (HOSPITAL_COMMUNITY): Payer: Commercial Managed Care - HMO

## 2014-07-07 DIAGNOSIS — I10 Essential (primary) hypertension: Secondary | ICD-10-CM | POA: Diagnosis present

## 2014-07-07 DIAGNOSIS — E876 Hypokalemia: Secondary | ICD-10-CM | POA: Diagnosis present

## 2014-07-07 DIAGNOSIS — Z87891 Personal history of nicotine dependence: Secondary | ICD-10-CM

## 2014-07-07 DIAGNOSIS — Z961 Presence of intraocular lens: Secondary | ICD-10-CM | POA: Diagnosis present

## 2014-07-07 DIAGNOSIS — Z9013 Acquired absence of bilateral breasts and nipples: Secondary | ICD-10-CM | POA: Diagnosis present

## 2014-07-07 DIAGNOSIS — J45902 Unspecified asthma with status asthmaticus: Secondary | ICD-10-CM | POA: Diagnosis not present

## 2014-07-07 DIAGNOSIS — J449 Chronic obstructive pulmonary disease, unspecified: Secondary | ICD-10-CM | POA: Diagnosis present

## 2014-07-07 DIAGNOSIS — F419 Anxiety disorder, unspecified: Secondary | ICD-10-CM | POA: Diagnosis not present

## 2014-07-07 DIAGNOSIS — R0602 Shortness of breath: Secondary | ICD-10-CM | POA: Diagnosis present

## 2014-07-07 DIAGNOSIS — M1990 Unspecified osteoarthritis, unspecified site: Secondary | ICD-10-CM | POA: Diagnosis present

## 2014-07-07 DIAGNOSIS — Z853 Personal history of malignant neoplasm of breast: Secondary | ICD-10-CM

## 2014-07-07 DIAGNOSIS — Z9841 Cataract extraction status, right eye: Secondary | ICD-10-CM | POA: Diagnosis not present

## 2014-07-07 DIAGNOSIS — K219 Gastro-esophageal reflux disease without esophagitis: Secondary | ICD-10-CM | POA: Diagnosis not present

## 2014-07-07 LAB — CBC WITH DIFFERENTIAL/PLATELET
BASOS ABS: 0.1 10*3/uL (ref 0.0–0.1)
BASOS PCT: 1 % (ref 0–1)
EOS ABS: 1.6 10*3/uL — AB (ref 0.0–0.7)
Eosinophils Relative: 13 % — ABNORMAL HIGH (ref 0–5)
HEMATOCRIT: 41 % (ref 36.0–46.0)
HEMOGLOBIN: 13.8 g/dL (ref 12.0–15.0)
LYMPHS ABS: 3 10*3/uL (ref 0.7–4.0)
Lymphocytes Relative: 25 % (ref 12–46)
MCH: 31.4 pg (ref 26.0–34.0)
MCHC: 33.7 g/dL (ref 30.0–36.0)
MCV: 93.2 fL (ref 78.0–100.0)
Monocytes Absolute: 1 10*3/uL (ref 0.1–1.0)
Monocytes Relative: 8 % (ref 3–12)
NEUTROS PCT: 53 % (ref 43–77)
Neutro Abs: 6.5 10*3/uL (ref 1.7–7.7)
PLATELETS: 323 10*3/uL (ref 150–400)
RBC: 4.4 MIL/uL (ref 3.87–5.11)
RDW: 13.2 % (ref 11.5–15.5)
WBC: 12.1 10*3/uL — ABNORMAL HIGH (ref 4.0–10.5)

## 2014-07-07 LAB — BASIC METABOLIC PANEL
ANION GAP: 11 (ref 5–15)
BUN: 14 mg/dL (ref 6–23)
CALCIUM: 9.4 mg/dL (ref 8.4–10.5)
CO2: 27 mmol/L (ref 19–32)
Chloride: 100 mmol/L (ref 96–112)
Creatinine, Ser: 1.2 mg/dL — ABNORMAL HIGH (ref 0.50–1.10)
GFR, EST AFRICAN AMERICAN: 50 mL/min — AB (ref >90)
GFR, EST NON AFRICAN AMERICAN: 43 mL/min — AB (ref >90)
Glucose, Bld: 97 mg/dL (ref 70–99)
Potassium: 3.3 mmol/L — ABNORMAL LOW (ref 3.5–5.1)
SODIUM: 138 mmol/L (ref 135–145)

## 2014-07-07 MED ORDER — PREDNISONE 50 MG PO TABS
60.0000 mg | ORAL_TABLET | Freq: Once | ORAL | Status: AC
  Administered 2014-07-07: 60 mg via ORAL
  Filled 2014-07-07 (×2): qty 1

## 2014-07-07 MED ORDER — IPRATROPIUM BROMIDE 0.02 % IN SOLN
1.0000 mg | Freq: Once | RESPIRATORY_TRACT | Status: AC
  Administered 2014-07-07: 1 mg via RESPIRATORY_TRACT
  Filled 2014-07-07: qty 5

## 2014-07-07 MED ORDER — ALBUTEROL SULFATE (2.5 MG/3ML) 0.083% IN NEBU
5.0000 mg | INHALATION_SOLUTION | Freq: Once | RESPIRATORY_TRACT | Status: AC
  Administered 2014-07-07: 5 mg via RESPIRATORY_TRACT
  Filled 2014-07-07: qty 6

## 2014-07-07 MED ORDER — POTASSIUM CHLORIDE CRYS ER 20 MEQ PO TBCR
40.0000 meq | EXTENDED_RELEASE_TABLET | Freq: Once | ORAL | Status: AC
  Administered 2014-07-07: 40 meq via ORAL
  Filled 2014-07-07: qty 2

## 2014-07-07 MED ORDER — ALBUTEROL (5 MG/ML) CONTINUOUS INHALATION SOLN
5.0000 mg/h | INHALATION_SOLUTION | Freq: Once | RESPIRATORY_TRACT | Status: AC
  Administered 2014-07-07: 5 mg/h via RESPIRATORY_TRACT
  Filled 2014-07-07: qty 20

## 2014-07-07 NOTE — ED Notes (Signed)
Sob for 3 weeks, took z-pack and prednisone and got better , but "it's coming back"

## 2014-07-07 NOTE — ED Notes (Signed)
Patient presents to ed c/o sob onset 3 weeks ago States she has been on predisone and z pak and finished 3 weeks ago. States she keeps getting these episode and each gets worse. C/o productive cough occ. States it is hard for her to lay down to sleep. Denies swelling in lower ext.

## 2014-07-07 NOTE — ED Provider Notes (Addendum)
CSN: [id number]     Arrival date & time 07/07/14  1757 History   First MD Initiated Contact with Patient 07/07/14 1838     Chief Complaint  Patient presents with  . Shortness of Breath     (Consider location/radiation/quality/duration/timing/severity/associated sxs/prior Treatment) HPI Complains of cough, nonproductive accompanied by shortness of breath and wheezing onset 3 weeks ago. Denies fever. Denies chills. No nausea or vomiting.. She treated herself with albuterol nebulizer and HFA inhaler with temporary relief. No other associated symptoms. No chest pain.  Symptoms feel like bronchitis she's had in the past Past Medical History  Diagnosis Date  . Hypertension   . Asthma   . Shortness of breath   . GERD (gastroesophageal reflux disease)   . H/O hiatal hernia   . Anxiety   . Arthritis   . Cancer     right breast   Past Surgical History  Procedure Laterality Date  . Septoplasty    . Mastectomy      bilateral mastectomy-right breast cancer-left taken by choice  . Cataract extraction w/phaco  07/23/2011    Procedure: CATARACT EXTRACTION PHACO AND INTRAOCULAR LENS PLACEMENT (IOC);  Surgeon: Williams Che, MD;  Location: [facility];  Service: Ophthalmology;  Laterality: Right;  CDE:9.96   Family History  Problem Relation Age of Onset  . Anesthesia problems Neg Hx   . Hypotension Neg Hx   . Malignant hyperthermia Neg Hx   . Pseudochol deficiency Neg Hx    History  Substance Use Topics  . Smoking status: Former Smoker -- 1.00 packs/day for 28 years    Types: Cigarettes    Quit date: 07/17/1983  . Smokeless tobacco: Not on file  . Alcohol Use: No   OB History    No data available     Review of Systems  Constitutional: Negative.   HENT: Negative.   Respiratory: Positive for cough, shortness of breath and wheezing.   Cardiovascular: Negative.   Gastrointestinal: Negative.   Musculoskeletal: Negative.   Skin: Negative.   Neurological: Negative.    Psychiatric/Behavioral: Negative.   All other systems reviewed and are negative.     Allergies  Review of patient's allergies indicates no known allergies.  Home Medications   Prior to Admission medications   Medication Sig Start Date End Date Taking? Authorizing Provider  acetaminophen (TYLENOL) 500 MG tablet Take 500 mg by mouth every 6 (six) hours as needed. Pain    Historical Provider, MD  ALPRAZolam (XANAX) 0.25 MG tablet Take 0.25 mg by mouth daily as needed. Anxiety    Historical Provider, MD  beclomethasone (QVAR) 80 MCG/ACT inhaler Inhale 1 puff into the lungs daily as needed. Shortness of breath    Historical Provider, MD  fluticasone (FLONASE) 50 MCG/ACT nasal spray Place 2 sprays into the nose daily as needed. Allergies    Historical Provider, MD  Glycerin-Hypromellose-PEG 400 (VISINE TEARS OP) Apply 1 drop to eye daily as needed. Watery Eyes    Historical Provider, MD  losartan-hydrochlorothiazide (HYZAAR) 100-25 MG per tablet Take 1 tablet by mouth daily.    Historical Provider, MD  verapamil (VERELAN PM) 240 MG 24 hr capsule Take 240 mg by mouth at bedtime.    Historical Provider, MD   BP 161/72 mmHg  Pulse 116  Temp(Src) 99.2 F (37.3 C) (Oral)  Resp 28  Ht 5\' 4"  (1.626 m)  Wt 170 lb (77.111 kg)  BMI 29.17 kg/m2  SpO2 93% Physical Exam  Constitutional: She is oriented to person, place,  and time. She appears well-developed and well-nourished. No distress.  HENT:  Head: Normocephalic and atraumatic.  Eyes: Conjunctivae are normal. Pupils are equal, round, and reactive to light.  Neck: Neck supple. No tracheal deviation present. No thyromegaly present.  Cardiovascular: Normal rate and regular rhythm.   No murmur heard. Pulmonary/Chest: Effort normal.  Speaks in paragraphs. No respiratory distress. Prolonged expiratory phase with expiratory wheezes  Abdominal: Soft. Bowel sounds are normal. She exhibits no distension. There is no tenderness.  Musculoskeletal:  Normal range of motion. She exhibits no edema or tenderness.  Neurological: She is alert and oriented to person, place, and time. Coordination normal.  Skin: Skin is warm and dry. No rash noted.  Psychiatric: She has a normal mood and affect.  Nursing note and vitals reviewed.   ED Course  Procedures (including critical care time)  Labs Review Labs Reviewed - No data to display  Imaging Review No results found.   EKG Interpretation None      Pt's  Breathing improved after treatment with albuterol nebulized treatment. On lung exam she speaks in paragraphs. Lungs with expiratory wheezes. Patient ambulated around the emergency department pulse oximetry desaturated to 85%, consistent with hypoxia. Prednisone and continuous nebulization ordered by meat 21:45 PM  At 10:05 PM after completing continues nebulization treatment with prednisone patient mildly dyspneic. Pulse oximetry desaturates to 86% on room air consistent with hypoxemia and heart rate 121. .chest x-ray viewed by me Results for orders placed or performed during the hospital encounter of 07/07/14  CBC with Differential/Platelet  Result Value Ref Range   WBC 12.1 (H) 4.0 - 10.5 K/uL   RBC 4.40 3.87 - 5.11 MIL/uL   Hemoglobin 13.8 12.0 - 15.0 g/dL   HCT 41.0 36.0 - 46.0 %   MCV 93.2 78.0 - 100.0 fL   MCH 31.4 26.0 - 34.0 pg   MCHC 33.7 30.0 - 36.0 g/dL   RDW 13.2 11.5 - 15.5 %   Platelets 323 150 - 400 K/uL   Neutrophils Relative % 53 43 - 77 %   Neutro Abs 6.5 1.7 - 7.7 K/uL   Lymphocytes Relative 25 12 - 46 %   Lymphs Abs 3.0 0.7 - 4.0 K/uL   Monocytes Relative 8 3 - 12 %   Monocytes Absolute 1.0 0.1 - 1.0 K/uL   Eosinophils Relative 13 (H) 0 - 5 %   Eosinophils Absolute 1.6 (H) 0.0 - 0.7 K/uL   Basophils Relative 1 0 - 1 %   Basophils Absolute 0.1 0.0 - 0.1 K/uL  Basic metabolic panel  Result Value Ref Range   Sodium 138 135 - 145 mmol/L   Potassium 3.3 (L) 3.5 - 5.1 mmol/L   Chloride 100 96 - 112 mmol/L    CO2 27 19 - 32 mmol/L   Glucose, Bld 97 70 - 99 mg/dL   BUN 14 6 - 23 mg/dL   Creatinine, Ser 1.20 (H) 0.50 - 1.10 mg/dL   Calcium 9.4 8.4 - 10.5 mg/dL   GFR calc non Af Amer 43 (L) >90 mL/min   GFR calc Af Amer 50 (L) >90 mL/min   Anion gap 11 5 - 15   Dg Chest 2 View  07/07/2014   CLINICAL DATA:  Shortness of breath  EXAM: CHEST  2 VIEW  COMPARISON:  04/04/2012  FINDINGS: The heart size and mediastinal contours are within normal limits. Both lungs are clear. Chronic interstitial coarsening and hyperinflation noted. The visualized skeletal structures are unremarkable.  IMPRESSION: No  acute cardiopulmonary abnormalities.   Electronically Signed   By: Kerby Moors M.D.   On: 07/07/2014 20:21    MDM  Patient requires inpatient stay as she has an oxygen requirement and will require further nebulization treatments and oxygen.  Her primary care physician is [place]. Final diagnoses:  None    Diagnoses #1 status asthmaticus #2 hypoxia #3 hypokalemia CRITICAL CARE Performed by: Orlie Dakin Total critical care time: 30 minute Critical care time was exclusive of separately billable procedures and treating other patients. Critical care was necessary to treat or prevent imminent or life-threatening deterioration. Critical care was time spent personally by me on the following activities: development of treatment plan with patient and/or surrogate as well as nursing, discussions with consultants, evaluation of patient's response to treatment, examination of patient, obtaining history from patient or surrogate, ordering and performing treatments and interventions, ordering and review of laboratory studies, ordering and review of radiographic studies, pulse oximetry and re-evaluation of patient's condition.    Orlie Dakin, MD 07/07/14 [id number]  Orlie Dakin, MD 07/07/14 [place], MD 07/07/14 2326

## 2014-07-07 NOTE — ED Notes (Signed)
Walked around the nurses station. Oxygen 86. Pulse 120.

## 2014-07-07 NOTE — ED Notes (Signed)
Ambulated patient around nursing station once. Oxygen was 85%. Pulse was 122. No dizziness or feeling of weakness.

## 2014-07-08 ENCOUNTER — Encounter (HOSPITAL_COMMUNITY): Payer: Self-pay

## 2014-07-08 DIAGNOSIS — R0602 Shortness of breath: Secondary | ICD-10-CM

## 2014-07-08 DIAGNOSIS — J45902 Unspecified asthma with status asthmaticus: Principal | ICD-10-CM

## 2014-07-08 DIAGNOSIS — K219 Gastro-esophageal reflux disease without esophagitis: Secondary | ICD-10-CM | POA: Diagnosis present

## 2014-07-08 DIAGNOSIS — F419 Anxiety disorder, unspecified: Secondary | ICD-10-CM

## 2014-07-08 DIAGNOSIS — I10 Essential (primary) hypertension: Secondary | ICD-10-CM | POA: Insufficient documentation

## 2014-07-08 LAB — CBC
HCT: 41.2 % (ref 36.0–46.0)
Hemoglobin: 13.6 g/dL (ref 12.0–15.0)
MCH: 30.6 pg (ref 26.0–34.0)
MCHC: 33 g/dL (ref 30.0–36.0)
MCV: 92.6 fL (ref 78.0–100.0)
Platelets: 312 10*3/uL (ref 150–400)
RBC: 4.45 MIL/uL (ref 3.87–5.11)
RDW: 13.2 % (ref 11.5–15.5)
WBC: 7.9 10*3/uL (ref 4.0–10.5)

## 2014-07-08 LAB — BASIC METABOLIC PANEL
ANION GAP: 10 (ref 5–15)
BUN: 13 mg/dL (ref 6–23)
CHLORIDE: 102 mmol/L (ref 96–112)
CO2: 27 mmol/L (ref 19–32)
Calcium: 9.7 mg/dL (ref 8.4–10.5)
Creatinine, Ser: 1.14 mg/dL — ABNORMAL HIGH (ref 0.50–1.10)
GFR calc Af Amer: 53 mL/min — ABNORMAL LOW (ref >90)
GFR, EST NON AFRICAN AMERICAN: 46 mL/min — AB (ref >90)
GLUCOSE: 188 mg/dL — AB (ref 70–99)
POTASSIUM: 3.4 mmol/L — AB (ref 3.5–5.1)
Sodium: 139 mmol/L (ref 135–145)

## 2014-07-08 MED ORDER — IPRATROPIUM-ALBUTEROL 0.5-2.5 (3) MG/3ML IN SOLN
3.0000 mL | RESPIRATORY_TRACT | Status: DC
  Administered 2014-07-08 – 2014-07-09 (×6): 3 mL via RESPIRATORY_TRACT
  Filled 2014-07-08 (×7): qty 3

## 2014-07-08 MED ORDER — SODIUM CHLORIDE 0.9 % IV SOLN: 250.0000 mL | INTRAVENOUS | Status: DC | PRN

## 2014-07-08 MED ORDER — AZITHROMYCIN 250 MG PO TABS
250.0000 mg | ORAL_TABLET | Freq: Every day | ORAL | Status: DC
  Administered 2014-07-09: 250 mg via ORAL
  Filled 2014-07-08: qty 1

## 2014-07-08 MED ORDER — IPRATROPIUM BROMIDE 0.02 % IN SOLN: 0.5000 mg | Freq: Four times a day (QID) | RESPIRATORY_TRACT | Status: DC

## 2014-07-08 MED ORDER — METHYLPREDNISOLONE SODIUM SUCC 125 MG IJ SOLR
80.0000 mg | Freq: Two times a day (BID) | INTRAMUSCULAR | Status: DC
  Administered 2014-07-08 – 2014-07-09 (×3): 80 mg via INTRAVENOUS
  Filled 2014-07-08 (×3): qty 2

## 2014-07-08 MED ORDER — BENZONATATE 100 MG PO CAPS
100.0000 mg | ORAL_CAPSULE | Freq: Three times a day (TID) | ORAL | Status: DC | PRN
  Administered 2014-07-08: 100 mg via ORAL
  Filled 2014-07-08: qty 1

## 2014-07-08 MED ORDER — SODIUM CHLORIDE 0.9 % IJ SOLN
3.0000 mL | Freq: Two times a day (BID) | INTRAMUSCULAR | Status: DC
  Administered 2014-07-08 (×2): 3 mL via INTRAVENOUS

## 2014-07-08 MED ORDER — ACETAMINOPHEN 325 MG PO TABS
650.0000 mg | ORAL_TABLET | Freq: Four times a day (QID) | ORAL | Status: DC | PRN
  Administered 2014-07-08: 650 mg via ORAL
  Filled 2014-07-08: qty 2

## 2014-07-08 MED ORDER — AZITHROMYCIN 250 MG PO TABS
500.0000 mg | ORAL_TABLET | Freq: Every day | ORAL | Status: AC
  Administered 2014-07-08: 500 mg via ORAL
  Filled 2014-07-08: qty 2

## 2014-07-08 MED ORDER — VERAPAMIL HCL ER 240 MG PO TBCR
240.0000 mg | EXTENDED_RELEASE_TABLET | Freq: Every day | ORAL | Status: DC
  Administered 2014-07-08: 240 mg via ORAL
  Filled 2014-07-08: qty 1

## 2014-07-08 MED ORDER — ENOXAPARIN SODIUM 40 MG/0.4ML ~~LOC~~ SOLN
40.0000 mg | Freq: Every day | SUBCUTANEOUS | Status: DC
  Administered 2014-07-08 (×2): 40 mg via SUBCUTANEOUS
  Filled 2014-07-08 (×2): qty 0.4

## 2014-07-08 MED ORDER — SODIUM CHLORIDE 0.9 % IJ SOLN
3.0000 mL | Freq: Two times a day (BID) | INTRAMUSCULAR | Status: DC
  Administered 2014-07-08: 3 mL via INTRAVENOUS

## 2014-07-08 MED ORDER — SODIUM CHLORIDE 0.9 % IJ SOLN: 3.0000 mL | INTRAMUSCULAR | Status: DC | PRN

## 2014-07-08 MED ORDER — FLUTICASONE PROPIONATE 50 MCG/ACT NA SUSP
1.0000 | Freq: Every day | NASAL | Status: DC
  Administered 2014-07-08 – 2014-07-09 (×2): 1 via NASAL
  Filled 2014-07-08: qty 16

## 2014-07-08 MED ORDER — POTASSIUM CHLORIDE CRYS ER 20 MEQ PO TBCR
40.0000 meq | EXTENDED_RELEASE_TABLET | Freq: Once | ORAL | Status: AC
  Administered 2014-07-08: 40 meq via ORAL
  Filled 2014-07-08: qty 2

## 2014-07-08 MED ORDER — BUDESONIDE-FORMOTEROL FUMARATE 160-4.5 MCG/ACT IN AERO
2.0000 | INHALATION_SPRAY | Freq: Two times a day (BID) | RESPIRATORY_TRACT | Status: DC
  Administered 2014-07-08 – 2014-07-09 (×2): 2 via RESPIRATORY_TRACT
  Filled 2014-07-08: qty 6

## 2014-07-08 MED ORDER — ALBUTEROL SULFATE (2.5 MG/3ML) 0.083% IN NEBU: 2.5000 mg | INHALATION_SOLUTION | Freq: Four times a day (QID) | RESPIRATORY_TRACT | Status: DC

## 2014-07-08 MED ORDER — IPRATROPIUM-ALBUTEROL 0.5-2.5 (3) MG/3ML IN SOLN
3.0000 mL | Freq: Four times a day (QID) | RESPIRATORY_TRACT | Status: DC
  Administered 2014-07-08: 3 mL via RESPIRATORY_TRACT
  Filled 2014-07-08 (×2): qty 3

## 2014-07-08 MED ORDER — ALBUTEROL SULFATE (2.5 MG/3ML) 0.083% IN NEBU: 2.5000 mg | INHALATION_SOLUTION | RESPIRATORY_TRACT | Status: DC | PRN

## 2014-07-08 NOTE — Progress Notes (Signed)
UR chart review completed.  

## 2014-07-08 NOTE — Progress Notes (Signed)
[facility] PROGRESS NOTE  Bailey Bishop [id number] DOB: 02/12/1941 DOA: 07/07/2014 PCP: Rocky Morel, MD  Assessment/Plan: 1. Acute asthmatic exacerbation. -Patient presenting with complaints of cough associated with wheezing and shortness of breath. On lung exam had bilateral extensive wheezing with diminished breath sounds and prolonged expiratory phase. -Will continue Solu-Medrol 80 mg IV every 12 hours along with scheduled nebulizer treatments. -Chest x-ray did not reveal evidence of acute consolidated process. -There may be underlying bacterial bronchitis component, will continue azithromycin  2.  Hypokalemia.  -AM lab work showing potassium of 3.4, or place with oral potassium  3.  Hypertension. -Patient having a blood pressure 134/61 this morning. -We'll continue to monitor her blood pressures off of antihypertensive agents for now.  4.  Suspected acute bacterial bronchitis versus viral infection -I suspect underlying infectious process precipitating asthmatic exacerbation, will continue empiric coverage with azithromycin  Code Status: Full code Family Communication:  Disposition Plan: Continue IV steroids, supportive care, anticipate discharge home when medically stable    Antibiotics:  Azithromycin  HPI/Subjective: Patient is a pleasant 74 year old female with a past medical history of asthma/COPD, who was admitted to the medicine service on 07/08/2014 presenting with complaints of shortness of breath associate with cough and wheezing over the past week. Patient reported using home inhalers with minimal relief. She was worked up with a chest x-ray did not show acute cardiopulmonary abnormalities. She was started on Solu-Medrol 80 mg IV every 12 hours along with Ace azithromycin. By the following morning she reported feeling better.   Objective: Filed Vitals:   07/08/14 0643  BP: 134/61  Pulse: 104  Temp: 99 F (37.2 C)  Resp: 22     Intake/Output Summary (Last 24 hours) at 07/08/14 1025 Last data filed at 07/08/14 0500  Gross per 24 hour  Intake      0 ml  Output   1200 ml  Net  -1200 ml   Filed Weights   07/07/14 1807 07/08/14 0027  Weight: 77.111 kg (170 lb) 78.427 kg (172 lb 14.4 oz)    Exam:   General:  Patient states feeling little better this morning, she is awake alert oriented  Cardiovascular: Tachycardic, regular rate rhythm normal S1-S2  Respiratory: Diminished breath sounds bilaterally with prolonged expiratory phase, has extensive bilateral wheezing  Abdomen: Soft nontender nondistended  Musculoskeletal: No edema   Data Reviewed: Basic Metabolic Panel:  Recent Labs Lab 07/07/14 1859 07/08/14 0614  NA 138 139  K 3.3* 3.4*  CL 100 102  CO2 27 27  GLUCOSE 97 188*  BUN 14 13  CREATININE 1.20* 1.14*  CALCIUM 9.4 9.7   Liver Function Tests: No results for input(s): AST, ALT, ALKPHOS, BILITOT, PROT, ALBUMIN in the last 168 hours. No results for input(s): LIPASE, AMYLASE in the last 168 hours. No results for input(s): AMMONIA in the last 168 hours. CBC:  Recent Labs Lab 07/07/14 1859 07/08/14 0614  WBC 12.1* 7.9  NEUTROABS 6.5  --   HGB 13.8 13.6  HCT 41.0 41.2  MCV 93.2 92.6  PLT 323 312   Cardiac Enzymes: No results for input(s): CKTOTAL, CKMB, CKMBINDEX, TROPONINI in the last 168 hours. BNP (last 3 results) No results for input(s): BNP in the last 8760 hours.  ProBNP (last 3 results) No results for input(s): PROBNP in the last 8760 hours.  CBG: No results for input(s): GLUCAP in the last 168 hours.  No results found for this or any previous visit (from the past 240 hour(s)).  Studies: Dg Chest 2 View  07/07/2014   CLINICAL DATA:  Shortness of breath  EXAM: CHEST  2 VIEW  COMPARISON:  04/04/2012  FINDINGS: The heart size and mediastinal contours are within normal limits. Both lungs are clear. Chronic interstitial coarsening and hyperinflation noted. The  visualized skeletal structures are unremarkable.  IMPRESSION: No acute cardiopulmonary abnormalities.   Electronically Signed   By: Kerby Moors M.D.   On: 07/07/2014 20:21    Scheduled Meds: . [START ON 07/09/2014] azithromycin  250 mg Oral Daily  . budesonide-formoterol  2 puff Inhalation BID  . enoxaparin (LOVENOX) injection  40 mg Subcutaneous QHS  . ipratropium-albuterol  3 mL Nebulization Q4H  . methylPREDNISolone (SOLU-MEDROL) injection  80 mg Intravenous Q12H  . sodium chloride  3 mL Intravenous Q12H  . sodium chloride  3 mL Intravenous Q12H   Continuous Infusions:   Principal Problem:   Status asthmaticus Active Problems:   Hypertension   Anxiety   GERD (gastroesophageal reflux disease)   Shortness of breath   Essential hypertension    Time spent:     Kelvin Cellar  [facility] Pager [phone]. If 7PM-7AM, please contact night-coverage at [email or web address], password [facility] 07/08/2014, 10:25 AM  LOS: 1 day

## 2014-07-08 NOTE — Care Management Note (Addendum)
    Page 1 of 1   07/09/2014     9:45:26 AM CARE MANAGEMENT NOTE 07/09/2014  Patient:  Bailey Bishop, Bailey Bishop   Account Number:  [id number]  Date Initiated:  07/08/2014  Documentation initiated by:  Theophilus Kinds  Subjective/Objective Assessment:   Pt admitted from home with status asthmaticus. Pt lives with her husband and will return home at discharge. Pt stated she is very independent with ADL's.     Action/Plan:   Will continue to follow for possible home O2 needs.   Anticipated DC Date:  07/10/2014   Anticipated DC Plan:  [place]  CM consult      Choice offered to / List presented to:             Status of service:  Completed, signed off Medicare Important Message given?  NA - LOS <3 / Initial given by admissions (If response is "NO", the following Medicare IM given date fields will be blank) Date Medicare IM given:   Medicare IM given by:   Date Additional Medicare IM given:   Additional Medicare IM given by:    Discharge Disposition:  HOME/SELF CARE  Per UR Regulation:    If discussed at Long Length of Stay Meetings, dates discussed:    Comments:  07/09/14 0945 Christinia Gully, RN BSN CM Pt discharged home today. No CM needs noted.  07/08/14 [place], RN BSN CM

## 2014-07-08 NOTE — H&P (Signed)
PCP:   Rocky Morel, MD   Chief Complaint:  Sob and wheezing  HPI: 74 yo female with worsening sob and wheezing for over a week.  Home inhaler and nebulizer has not been helping.  H/o asthma.  No fevers.  Feels some better with hour long neb in ED but wheezing and sob gets worse with ambulation along with desats.  Not on home oxygen.  Pt saw her pcp over a week ago, was given a zpack and 5 days prednisone taper.  She improved with that, than within a couple of days of finishing the steroid she worsened again.  She is on steroids about 6-8 x  A year she reports.  She use to see dr Luan Pulling, but wishes to no longer see him.  She has not seen any other pulmonologist.  Review of Systems:  Positive and negative as per HPI otherwise all other systems are negative  Past Medical History: Past Medical History  Diagnosis Date  . Hypertension   . Asthma   . Shortness of breath   . GERD (gastroesophageal reflux disease)   . H/O hiatal hernia   . Anxiety   . Arthritis   . Cancer     right breast   Past Surgical History  Procedure Laterality Date  . Septoplasty    . Mastectomy      bilateral mastectomy-right breast cancer-left taken by choice  . Cataract extraction w/phaco  07/23/2011    Procedure: CATARACT EXTRACTION PHACO AND INTRAOCULAR LENS PLACEMENT (IOC);  Surgeon: Williams Che, MD;  Location: [facility];  Service: Ophthalmology;  Laterality: Right;  CDE:9.96    Medications: Prior to Admission medications   Medication Sig Start Date End Date Taking? Authorizing Provider  acetaminophen (TYLENOL) 650 MG CR tablet Take 650 mg by mouth at bedtime.   Yes Historical Provider, MD  albuterol (PROVENTIL HFA;VENTOLIN HFA) 108 (90 BASE) MCG/ACT inhaler Inhale 1-2 puffs into the lungs every 6 (six) hours as needed for wheezing or shortness of breath.   Yes Historical Provider, MD  ALPRAZolam Duanne Moron) 0.5 MG tablet Take 0.5 mg by mouth 4 (four) times daily as needed. FOR SHORTNESS OF  BREATH 06/18/14  Yes Historical Provider, MD  budesonide-formoterol (SYMBICORT) 160-4.5 MCG/ACT inhaler Inhale 2 puffs into the lungs 2 (two) times daily.   Yes Historical Provider, MD  fluticasone (FLONASE) 50 MCG/ACT nasal spray Place 2 sprays into the nose daily as needed. Allergies   Yes Historical Provider, MD  Glycerin-Hypromellose-PEG 400 (VISINE TEARS OP) Apply 1 drop to eye daily as needed. Watery Eyes   Yes Historical Provider, MD  ipratropium (ATROVENT) 0.02 % nebulizer solution Take 0.5 mg by nebulization every 6 (six) hours as needed for wheezing or shortness of breath.   Yes Historical Provider, MD  losartan-hydrochlorothiazide (HYZAAR) 100-25 MG per tablet Take 1 tablet by mouth daily.   Yes Historical Provider, MD  verapamil (VERELAN PM) 240 MG 24 hr capsule Take 240 mg by mouth at bedtime.   Yes Historical Provider, MD    Allergies:  No Known Allergies  Social History:  reports that she quit smoking about 30 years ago. Her smoking use included Cigarettes. She has a 28 pack-year smoking history. She does not have any smokeless tobacco history on file. She reports that she does not drink alcohol or use illicit drugs.  Family History: Family History  Problem Relation Age of Onset  . Anesthesia problems Neg Hx   . Hypotension Neg Hx   . Malignant hyperthermia  Neg Hx   . Pseudochol deficiency Neg Hx     Physical Exam: Filed Vitals:   07/07/14 2200 07/07/14 2230 07/07/14 2300 07/07/14 2330  BP: 138/71 147/72 157/66 154/76  Pulse: 100 106 108 109  Temp:      TempSrc:      Resp: 17 22 23 24   Height:      Weight:      SpO2: 90% 99% 91% 88%   General appearance: alert, cooperative and no distress Head: Normocephalic, without obvious abnormality, atraumatic Eyes: negative Nose: Nares normal. Septum midline. Mucosa normal. No drainage or sinus tenderness. Neck: no JVD and supple, symmetrical, trachea midline Lungs: wheezes bilaterally Heart: regular rate and rhythm, S1,  S2 normal, no murmur, click, rub or gallop Abdomen: soft, non-tender; bowel sounds normal; no masses,  no organomegaly Extremities: extremities normal, atraumatic, no cyanosis or edema Pulses: 2+ and symmetric Skin: Skin color, texture, turgor normal. No rashes or lesions Neurologic: Grossly normal    Labs on Admission:   Recent Labs  07/07/14 1859  NA 138  K 3.3*  CL 100  CO2 27  GLUCOSE 97  BUN 14  CREATININE 1.20*  CALCIUM 9.4    Recent Labs  07/07/14 1859  WBC 12.1*  NEUTROABS 6.5  HGB 13.8  HCT 41.0  MCV 93.2  PLT 323   Radiological Exams on Admission: Dg Chest 2 View  07/07/2014   CLINICAL DATA:  Shortness of breath  EXAM: CHEST  2 VIEW  COMPARISON:  04/04/2012  FINDINGS: The heart size and mediastinal contours are within normal limits. Both lungs are clear. Chronic interstitial coarsening and hyperinflation noted. The visualized skeletal structures are unremarkable.  IMPRESSION: No acute cardiopulmonary abnormalities.   Electronically Signed   By: Kerby Moors M.D.   On: 07/07/2014 20:21    Assessment/Plan  75 yo female with acute asthma exacerbation  Principal Problem:   Status asthmaticus-  Improved.  Iv solumedrol.  freq nebs.  Zpack (likely not helpful).  Oxygen wean as tolerated.  cxr neg for infiltrate or edema.  Should improve in next couple of days.  Would recommended longer steroid taper at d/c.  Pt wishes to be referred to a pulmonologist in [place], has not seen dr Luan Pulling in years and wishes to establish with someone in [place].    Active Problems:  Stable unless o/w noted   Hypertension-  Stable, resume home meds once clarified   Anxiety-  stable   GERD (gastroesophageal reflux disease)   Shortness of breath- as above  Admit to tele.  Full code.  Faelynn Wynder A 07/08/2014, 12:09 AM

## 2014-07-08 NOTE — Progress Notes (Signed)
Inpatient Diabetes Program Recommendations  AACE/ADA: New Consensus Statement on Inpatient Glycemic Control (2013)  Target Ranges:  Prepandial:   less than 140 mg/dL      Peak postprandial:   less than 180 mg/dL (1-2 hours)      Critically ill patients:  140 - 180 mg/dL   Results for ARIEA, AZZARA (MRN [id number]) as of 07/08/2014 09:08  Ref. Range 07/08/2014 06:14  Glucose Latest Ref Range: 70-99 mg/dL 188 (H)   Diabetes history:NO Outpatient Diabetes medications: NA Current orders for Inpatient glycemic control: None  Inpatient Diabetes Program Recommendations Correction (SSI): Noted lab glucose 188 mg/dl this morning at 6:14. Hyperglycemia is likely due to steroids. While inpatient and ordered steroids,may want to consider ordering CBGs wtih Novolog correction.  Thanks, Barnie Alderman, RN, MSN, CCRN, CDE Diabetes Coordinator Inpatient Diabetes Program [phone] (Team Pager from [place] to [place]) [phone] ([facility] office) [phone] [facility] office)

## 2014-07-09 DIAGNOSIS — K219 Gastro-esophageal reflux disease without esophagitis: Secondary | ICD-10-CM

## 2014-07-09 DIAGNOSIS — I10 Essential (primary) hypertension: Secondary | ICD-10-CM

## 2014-07-09 MED ORDER — POTASSIUM CHLORIDE CRYS ER 20 MEQ PO TBCR
40.0000 meq | EXTENDED_RELEASE_TABLET | Freq: Once | ORAL | Status: AC
  Administered 2014-07-09: 40 meq via ORAL
  Filled 2014-07-09: qty 2

## 2014-07-09 MED ORDER — BENZONATATE 100 MG PO CAPS: 100.0000 mg | ORAL_CAPSULE | Freq: Three times a day (TID) | ORAL | Status: DC | PRN

## 2014-07-09 MED ORDER — AZITHROMYCIN 250 MG PO TABS: 250.0000 mg | ORAL_TABLET | Freq: Once | ORAL | Status: DC

## 2014-07-09 MED ORDER — PREDNISONE 10 MG (21) PO TBPK: ORAL_TABLET | ORAL | Status: DC

## 2014-07-09 MED ORDER — LOSARTAN POTASSIUM 50 MG PO TABS
100.0000 mg | ORAL_TABLET | Freq: Every day | ORAL | Status: DC
  Administered 2014-07-09: 100 mg via ORAL
  Filled 2014-07-09: qty 2

## 2014-07-09 MED ORDER — FLUTICASONE PROPIONATE 50 MCG/ACT NA SUSP: 2.0000 | Freq: Every day | NASAL | Status: DC

## 2014-07-09 MED ORDER — BUDESONIDE-FORMOTEROL FUMARATE 160-4.5 MCG/ACT IN AERO: 2.0000 | INHALATION_SPRAY | Freq: Two times a day (BID) | RESPIRATORY_TRACT | Status: DC

## 2014-07-09 NOTE — Discharge Summary (Signed)
Physician Discharge Summary  Bailey Bishop [id number] DOB: 06-16-1940 DOA: 07/07/2014  PCP: Bailey Morel, MD  Admit date: 07/07/2014 Discharge date: 07/09/2014  Time spent: 35 minutes  Recommendations for Outpatient Follow-up:  1. Please follow up on patient's respiratory status, she was treated for asthmatic exacerbation   Discharge Diagnoses:  Principal Problem:   Status asthmaticus Active Problems:   Hypertension   Anxiety   GERD (gastroesophageal reflux disease)   Shortness of breath   Essential hypertension   Discharge Condition: Stable  Diet recommendation: Heart Healthy  Filed Weights   07/07/14 1807 07/08/14 0027 07/09/14 0525  Weight: 77.111 kg (170 lb) 78.427 kg (172 lb 14.4 oz) 77.837 kg (171 lb 9.6 oz)    History of present illness:  74 yo female with worsening sob and wheezing for over a week. Home inhaler and nebulizer has not been helping. H/o asthma. No fevers. Feels some better with hour long neb in ED but wheezing and sob gets worse with ambulation along with desats. Not on home oxygen. Pt saw her pcp over a week ago, was given a zpack and 5 days prednisone taper. She improved with that, than within a couple of days of finishing the steroid she worsened again. She is on steroids about 6-8 x A year she reports. She use to see dr Bailey Bishop, but wishes to no longer see him. She has not seen any other pulmonologist.  Hospital Course:  Patient is a pleasant 74 year old female with a past medical history of asthma/COPD, who was admitted to the medicine service on 07/08/2014 presenting with complaints of shortness of breath associate with cough and wheezing over the past week. Patient reported using home inhalers with minimal relief. She was worked up with a chest x-ray did not show acute cardiopulmonary abnormalities. She was started on Solu-Medrol 80 mg IV every 12 hours along with Ace azithromycin. By the following morning she reported  feeling better.   1. Acute asthmatic exacerbation. -Patient presenting with complaints of cough associated with wheezing and shortness of breath. On lung exam had bilateral extensive wheezing with diminished breath sounds and prolonged expiratory phase. -She was treated with Solu-Medrol 80 mg IV every 12 hours along with scheduled nebulizer treatments. -Chest x-ray did not reveal evidence of acute consolidated process. -By 07/09/2014 she reported feeling much better and back to her baseline shortness of breath. She ambulated down the hallway to the nurses station without any issues maintaining oxygen saturations in the upper 90s -Will discharge her on prednisone taper  2. Hypokalemia.  -AM lab work showing potassium of 3.4, she was given 40 mEq of Kdur prior to discharge  3. Hypertension. -Will discharge her on her home regimen of Hyzaar and verapamil  4. Suspected acute bacterial bronchitis versus viral infection -I suspect underlying infectious process precipitating asthmatic exacerbation,  -She received azithromycin    Discharge Exam: Filed Vitals:   07/09/14 0525  BP: 163/85  Pulse: 114  Temp: 98 F (36.7 C)  Resp: 22    General: No acute distress, awake alert, states feeling better, anxious to go home today Cardiovascular: Regular rate and rhythm normal S1-S2  Respiratory: Significant improvement to lung exam with better air movement, lungs overall clear, improvement to Bailey Bishop wheezing Abdomen: Soft nontender nondistended  Discharge Instructions   Discharge Instructions    Call MD for:  difficulty breathing, headache or visual disturbances    Complete by:  As directed      Call MD for:  extreme fatigue  Complete by:  As directed      Call MD for:  hives    Complete by:  As directed      Call MD for:  persistant dizziness or light-headedness    Complete by:  As directed      Call MD for:  persistant nausea and vomiting    Complete by:  As directed      Call  MD for:  redness, tenderness, or signs of infection (pain, swelling, redness, odor or green/yellow discharge around incision site)    Complete by:  As directed      Call MD for:  severe uncontrolled pain    Complete by:  As directed      Call MD for:  temperature >100.4    Complete by:  As directed      Diet - low sodium heart healthy    Complete by:  As directed      Increase activity slowly    Complete by:  As directed           Current Discharge Medication List    START taking these medications   Details  azithromycin (ZITHROMAX) 250 MG tablet Take 1 tablet (250 mg total) by mouth once. Qty: 3 each, Refills: 0    benzonatate (TESSALON) 100 MG capsule Take 1 capsule (100 mg total) by mouth 3 (three) times daily as needed for cough. Qty: 20 capsule, Refills: 0    predniSONE (STERAPRED UNI-PAK 21 TAB) 10 MG (21) TBPK tablet Take 6-5-4-3-2-1 tablets by mouth daily till gone. Qty: 21 tablet, Refills: 0      CONTINUE these medications which have CHANGED   Details  budesonide-formoterol (SYMBICORT) 160-4.5 MCG/ACT inhaler Inhale 2 puffs into the lungs 2 (two) times daily. Qty: 1 Inhaler, Refills: 12    fluticasone (FLONASE) 50 MCG/ACT nasal spray Place 2 sprays into both nostrils daily. Allergies Qty: 1 g, Refills: 2      CONTINUE these medications which have NOT CHANGED   Details  acetaminophen (TYLENOL) 650 MG CR tablet Take 650 mg by mouth at bedtime.    albuterol (PROVENTIL HFA;VENTOLIN HFA) 108 (90 BASE) MCG/ACT inhaler Inhale 1-2 puffs into the lungs every 6 (six) hours as needed for wheezing or shortness of breath.    ALPRAZolam (XANAX) 0.5 MG tablet Take 0.5 mg by mouth 4 (four) times daily as needed. FOR SHORTNESS OF BREATH    Glycerin-Hypromellose-PEG 400 (VISINE TEARS OP) Apply 1 drop to eye daily as needed. Watery Eyes    losartan-hydrochlorothiazide (HYZAAR) 100-25 MG per tablet Take 1 tablet by mouth daily.    verapamil (VERELAN PM) 240 MG 24 hr capsule Take  240 mg by mouth at bedtime.      STOP taking these medications     ipratropium (ATROVENT) 0.02 % nebulizer solution        No Known Allergies Follow-up Information    Follow up with Bailey Morel, MD On 07/15/2014.   Specialty:  Family Medicine   Why:  at 12:30 pm   Contact information:   [address] [place] [id number] [phone]        The results of significant diagnostics from this hospitalization (including imaging, microbiology, ancillary and laboratory) are listed below for reference.    Significant Diagnostic Studies: Dg Chest 2 View  07/07/2014   CLINICAL DATA:  Shortness of breath  EXAM: CHEST  2 VIEW  COMPARISON:  04/04/2012  FINDINGS: The heart size and mediastinal contours are within normal limits.  Both lungs are clear. Chronic interstitial coarsening and hyperinflation noted. The visualized skeletal structures are unremarkable.  IMPRESSION: No acute cardiopulmonary abnormalities.   Electronically Signed   By: Kerby Moors M.D.   On: 07/07/2014 20:21    Microbiology: No results found for this or any previous visit (from the past 240 hour(s)).   Labs: Basic Metabolic Panel:  Recent Labs Lab 07/07/14 1859 07/08/14 0614  NA 138 139  K 3.3* 3.4*  CL 100 102  CO2 27 27  GLUCOSE 97 188*  BUN 14 13  CREATININE 1.20* 1.14*  CALCIUM 9.4 9.7   Liver Function Tests: No results for input(s): AST, ALT, ALKPHOS, BILITOT, PROT, ALBUMIN in the last 168 hours. No results for input(s): LIPASE, AMYLASE in the last 168 hours. No results for input(s): AMMONIA in the last 168 hours. CBC:  Recent Labs Lab 07/07/14 1859 07/08/14 0614  WBC 12.1* 7.9  NEUTROABS 6.5  --   HGB 13.8 13.6  HCT 41.0 41.2  MCV 93.2 92.6  PLT 323 312   Cardiac Enzymes: No results for input(s): CKTOTAL, CKMB, CKMBINDEX, TROPONINI in the last 168 hours. BNP: BNP (last 3 results) No results for input(s): BNP in the last 8760 hours.  ProBNP (last 3 results) No  results for input(s): PROBNP in the last 8760 hours.  CBG: No results for input(s): GLUCAP in the last 168 hours.     SignedKelvin Cellar  [facility] 07/09/2014, 10:13 AM

## 2014-07-09 NOTE — Progress Notes (Signed)
Discharge instructions given on medications,and follow up visits,patient verbalized understanding. Prescription sent with patient. Vital signs stable. No c/o pain or discomfort noted. Staff accompanied patient to vehicle.

## 2015-02-09 ENCOUNTER — Emergency Department (HOSPITAL_COMMUNITY)
Admission: EM | Admit: 2015-02-09 | Discharge: 2015-02-09 | Disposition: A | Discharge status: Home / Self Care | Payer: Commercial Managed Care - HMO | Attending: Emergency Medicine | Admitting: Emergency Medicine

## 2015-02-09 ENCOUNTER — Encounter (HOSPITAL_COMMUNITY): Payer: Self-pay | Admitting: Emergency Medicine

## 2015-02-09 ENCOUNTER — Emergency Department (HOSPITAL_COMMUNITY): Payer: Commercial Managed Care - HMO

## 2015-02-09 DIAGNOSIS — M1990 Unspecified osteoarthritis, unspecified site: Secondary | ICD-10-CM | POA: Diagnosis not present

## 2015-02-09 DIAGNOSIS — F419 Anxiety disorder, unspecified: Secondary | ICD-10-CM | POA: Diagnosis not present

## 2015-02-09 DIAGNOSIS — I10 Essential (primary) hypertension: Secondary | ICD-10-CM | POA: Diagnosis not present

## 2015-02-09 DIAGNOSIS — Z87891 Personal history of nicotine dependence: Secondary | ICD-10-CM | POA: Diagnosis not present

## 2015-02-09 DIAGNOSIS — R945 Abnormal results of liver function studies: Secondary | ICD-10-CM | POA: Diagnosis not present

## 2015-02-09 DIAGNOSIS — J45909 Unspecified asthma, uncomplicated: Secondary | ICD-10-CM | POA: Insufficient documentation

## 2015-02-09 DIAGNOSIS — Z8719 Personal history of other diseases of the digestive system: Secondary | ICD-10-CM | POA: Insufficient documentation

## 2015-02-09 DIAGNOSIS — R634 Abnormal weight loss: Secondary | ICD-10-CM | POA: Insufficient documentation

## 2015-02-09 DIAGNOSIS — Z7951 Long term (current) use of inhaled steroids: Secondary | ICD-10-CM | POA: Insufficient documentation

## 2015-02-09 DIAGNOSIS — K8020 Calculus of gallbladder without cholecystitis without obstruction: Secondary | ICD-10-CM | POA: Insufficient documentation

## 2015-02-09 DIAGNOSIS — Z7952 Long term (current) use of systemic steroids: Secondary | ICD-10-CM | POA: Insufficient documentation

## 2015-02-09 DIAGNOSIS — R7989 Other specified abnormal findings of blood chemistry: Secondary | ICD-10-CM

## 2015-02-09 DIAGNOSIS — R1010 Upper abdominal pain, unspecified: Secondary | ICD-10-CM | POA: Diagnosis present

## 2015-02-09 DIAGNOSIS — Z79899 Other long term (current) drug therapy: Secondary | ICD-10-CM | POA: Insufficient documentation

## 2015-02-09 DIAGNOSIS — Z853 Personal history of malignant neoplasm of breast: Secondary | ICD-10-CM | POA: Diagnosis not present

## 2015-02-09 DIAGNOSIS — R109 Unspecified abdominal pain: Secondary | ICD-10-CM

## 2015-02-09 LAB — URINE MICROSCOPIC-ADD ON: RBC / HPF: NONE SEEN RBC/hpf (ref 0–5)

## 2015-02-09 LAB — COMPREHENSIVE METABOLIC PANEL
ALBUMIN: 3.7 g/dL (ref 3.5–5.0)
ALK PHOS: 137 U/L — AB (ref 38–126)
ALT: 174 U/L — AB (ref 14–54)
AST: 101 U/L — AB (ref 15–41)
Anion gap: 8 (ref 5–15)
BUN: 12 mg/dL (ref 6–20)
CALCIUM: 9.5 mg/dL (ref 8.9–10.3)
CO2: 30 mmol/L (ref 22–32)
Chloride: 99 mmol/L — ABNORMAL LOW (ref 101–111)
Creatinine, Ser: 0.99 mg/dL (ref 0.44–1.00)
GFR calc Af Amer: >60 mL/min (ref >60)
GFR calc non Af Amer: 54 mL/min — ABNORMAL LOW (ref >60)
GLUCOSE: 121 mg/dL — AB (ref 65–99)
Potassium: 3.4 mmol/L — ABNORMAL LOW (ref 3.5–5.1)
SODIUM: 137 mmol/L (ref 135–145)
Total Bilirubin: 1.2 mg/dL (ref 0.3–1.2)
Total Protein: 7 g/dL (ref 6.5–8.1)

## 2015-02-09 LAB — URINALYSIS, ROUTINE W REFLEX MICROSCOPIC
Bilirubin Urine: NEGATIVE
Glucose, UA: NEGATIVE mg/dL
Hgb urine dipstick: NEGATIVE
Ketones, ur: NEGATIVE mg/dL
Nitrite: NEGATIVE
PROTEIN: NEGATIVE mg/dL
Specific Gravity, Urine: 1.01 (ref 1.005–1.030)
pH: 7.5 (ref 5.0–8.0)

## 2015-02-09 LAB — CBC WITH DIFFERENTIAL/PLATELET
BASOS ABS: 0 10*3/uL (ref 0.0–0.1)
Basophils Relative: 0 %
Eosinophils Absolute: 0.6 10*3/uL (ref 0.0–0.7)
Eosinophils Relative: 6 %
HCT: 39 % (ref 36.0–46.0)
Hemoglobin: 13 g/dL (ref 12.0–15.0)
Lymphocytes Relative: 16 %
Lymphs Abs: 1.8 10*3/uL (ref 0.7–4.0)
MCH: 30.9 pg (ref 26.0–34.0)
MCHC: 33.3 g/dL (ref 30.0–36.0)
MCV: 92.6 fL (ref 78.0–100.0)
Monocytes Absolute: 0.8 10*3/uL (ref 0.1–1.0)
Monocytes Relative: 8 %
NEUTROS ABS: 7.5 10*3/uL (ref 1.7–7.7)
NEUTROS PCT: 70 %
PLATELETS: 275 10*3/uL (ref 150–400)
RBC: 4.21 MIL/uL (ref 3.87–5.11)
RDW: 12.4 % (ref 11.5–15.5)
WBC: 10.7 10*3/uL — ABNORMAL HIGH (ref 4.0–10.5)

## 2015-02-09 LAB — LACTIC ACID, PLASMA
LACTIC ACID, VENOUS: 1.7 mmol/L (ref 0.5–2.0)
Lactic Acid, Venous: 1.4 mmol/L (ref 0.5–2.0)

## 2015-02-09 LAB — TROPONIN I: Troponin I: <0.03 ng/mL (ref <0.031)

## 2015-02-09 LAB — LIPASE, BLOOD: Lipase: 73 U/L — ABNORMAL HIGH (ref 11–51)

## 2015-02-09 MED ORDER — FAMOTIDINE IN NACL 20-0.9 MG/50ML-% IV SOLN
20.0000 mg | Freq: Once | INTRAVENOUS | Status: AC
  Administered 2015-02-09: 20 mg via INTRAVENOUS
  Filled 2015-02-09: qty 50

## 2015-02-09 MED ORDER — ONDANSETRON HCL 4 MG/2ML IJ SOLN: 4.0000 mg | INTRAMUSCULAR | Status: DC | PRN

## 2015-02-09 MED ORDER — SODIUM CHLORIDE 0.9 % IV SOLN
INTRAVENOUS | Status: DC
  Administered 2015-02-09: 14:00:00 via INTRAVENOUS

## 2015-02-09 MED ORDER — IOHEXOL 300 MG/ML  SOLN
100.0000 mL | Freq: Once | INTRAMUSCULAR | Status: AC | PRN
Start: 2015-02-09 — End: 2015-02-09
  Administered 2015-02-09: 100 mL via INTRAVENOUS

## 2015-02-09 MED ORDER — BARIUM SULFATE 2.1 % PO SUSP
ORAL | Status: AC
  Filled 2015-02-09: qty 1

## 2015-02-09 NOTE — ED Notes (Signed)
MD McManus at bedside updating patient and family. 

## 2015-02-09 NOTE — ED Notes (Signed)
Called pharmacy for Pepcid since pyxis is down. States they will send asap.

## 2015-02-09 NOTE — ED Notes (Signed)
Ultrasound at bedside

## 2015-02-09 NOTE — Discharge Instructions (Signed)
°Emergency Department Resource Guide °1) Find a Doctor and Pay Out of Pocket °Although you won't have to find out who is covered by your insurance plan, it is a good idea to ask around and get recommendations. You will then need to call the office and see if the doctor you have chosen will accept you as a new patient and what types of options they offer for patients who are self-pay. Some doctors offer discounts or will set up payment plans for their patients who do not have insurance, but you will need to ask so you aren't surprised when you get to your appointment. ° °2) Contact Your Local Health Department °Not all health departments have doctors that can see patients for sick visits, but many do, so it is worth a call to see if yours does. If you don't know where your local health department is, you can check in your phone book. The CDC also has a tool to help you locate your state's health department, and many state websites also have listings of all of their local health departments. ° °3) Find a Walk-in Clinic °If your illness is not likely to be very severe or complicated, you may want to try a walk in clinic. These are popping up all over the country in pharmacies, drugstores, and shopping centers. They're usually staffed by nurse practitioners or physician assistants that have been trained to treat common illnesses and complaints. They're usually fairly quick and inexpensive. However, if you have serious medical issues or chronic medical problems, these are probably not your best option. ° °No Primary Care Doctor: °- Call [facility] Connect at  832-8000 - they can help you locate a primary care doctor that  accepts your insurance, provides certain services, etc. °- Physician Referral Service- 1-800-533-3463 ° °Chronic Pain Problems: °Organization         Address  Phone   Notes  °[facility]  (336) [phone] Patients need to be referred by their primary care doctor.  ° °Medication  Assistance: °Organization         Address  Phone   Notes  °[place] [facility] [address] °[place], [place] [address] (336) 641-8030 --Must be a resident of [place] °-- Must have NO insurance coverage whatsoever (no Medicaid/ Medicare, etc.) °-- The pt. MUST have a primary care doctor that directs their care regularly and follows them in the community °  °[facility]  (866) 331-1348   °[employer]  (888) 892-1162   ° °Agencies that provide inexpensive medical care: °Organization         Address  Phone   Notes  °[facility]  (336) 832-8035   ° Internal Medicine    (336) 832-7272   °[facility] [address] °[place], [place] [address] (336) 832-4777   °[facility] [address], °[place] (336) 271-4999   °[facility] Parenthood    (336) 373-0678   °[facility]    (336) 272-1050   °[facility] ° [address], [place] Phone:  (336) 832-4444, Fax:  (336) 832-4440 Hours of Operation:  9 am - 6 pm, M-F.  Also accepts Medicaid/Medicare and self-pay.  °[facility] ° [address], [place] Phone: (336) 832-3150, Fax: (336) [phone]. Hours of Operation:  8:30 am - 5:30 pm, M-F.  Also accepts Medicaid and self-pay.  °[facility] [place] [address], [place] Phone: (336) 878-6027   °[facility] [address], [place], [place] (336)723-1848, Ext. 123 Mondays & Thursdays: 7-9 AM.  First 15 patients are seen on a first come, first serve basis. °  ° °Medicaid-accepting [place] Providers: ° °Organization         Address  Phone   Notes  °[facility] [address], [place] (336) 641-2100 Also accepts self-pay patients.  °[facility] [address], [place] ° (336) 856-9996   °[facility] [address], [place]  (336) 288-8857   °[facility] [address] [place] [address], [place] (336) 299-7000   °Veita Bland [address], [place]  ° (336) 373-1557 Only accepts [place] Access Medicaid patients after they have their name applied to their card.  ° °Self-Pay (no insurance) in [place]: ° °Organization         Address  Phone   Notes  °Sickle Cell Patients, [facility] [address], [place] (336) 832-1970   °[facility] [address], [place] (336) 832-4400   °[facility] ° [address], [place] (336) 992-4800   °[facility]/Dr. Osei-Bonsu ° [address] [place] [address], [place] or [address], [place] (336) [phone] Phone number for both [place] and [place] locations is the same.  °[facility] [address], [place] (336) 299-0000   °[facility] [address] [place] [address], [place] or [address] (336) 852-7530 °(336) 878-2260   °[facility] [address], [place] (336) 350-1642, phone; (336) 294-5005, fax Sees patients 1st and 3rd Saturday of every month.  Must not qualify for public or private insurance (i.e. Medicaid, Medicare, [place] Health Choice, Veterans' Benefits) • Household income should be no more than 200% of the poverty level •The clinic cannot treat you if you are pregnant or think you are pregnant • Sexually transmitted diseases are not treated at the clinic.  ° ° °Dental Care: °Organization         Address  Phone  Notes  °[place] [facility] [address], [place] (336) 641-6152 Accepts children up to age 21 who are enrolled in Medicaid or [place] Health Choice; pregnant women with a Medicaid card; and children who have applied for Medicaid or [place] Health Choice, but were declined, whose parents can pay a reduced fee at time of service.  °[place]  [facility] [place]  [address], [place] (336) 641-7733 Accepts children up to age 21 who are enrolled in Medicaid or [place] Health Choice; pregnant women with a Medicaid card; and children who have applied for Medicaid or [place] Health Choice, but were declined, whose parents can pay a reduced fee at time of service.  °[facility] ° [address], [place] (336) 641-4533 Patients are seen by appointment only. Walk-ins are not accepted. [facility] will see patients 18 years of age and older. °Monday - Tuesday (8am-5pm) °Most Wednesdays (8:30-5pm) °$30 per visit, cash only  °[facility] ° [address], [place] (336) 641-4533 Patients are seen by appointment only. Walk-ins are not accepted. [facility] will see patients 18 years of age and older. °One   Wednesday Evening (Monthly: Volunteer Based).  $30 per visit, cash only  °[facility]  (919) 537-3737 for adults; Children under age 4, call Graduate Pediatric Dentistry at (919) [phone]. Children aged 4-14, please call (919) [phone] to request a pediatric application. ° Dental services are provided in all areas of dental care including fillings, crowns and bridges, complete and partial dentures, implants, gum treatment, root canals, and extractions. Preventive care is also provided. Treatment is provided to both adults and children. °Patients are selected via a lottery and there is often a waiting list. °  °[facility] [address], °[place] ° (336) 763-8833 [email or web address] °  °[facility] [address], [place], [place] (336)723-1848, Ext. 123 Second and Fourth Thursday of each month, opens at 6:30 AM; Clinic ends at 9 AM.  Patients are seen on a first-come first-served basis, and a limited number are seen during each clinic.  ° °[facility] ° [address], [place], [place] (336) 723-7904    Eligibility Requirements °You must have lived in [place], [place], or [place] counties for at least the last three months. °  You cannot be eligible for state or federal sponsored healthcare insurance, including Veterans Administration, Medicaid, or Medicare. °  You generally cannot be eligible for healthcare insurance through your employer.  °  How to apply: °Eligibility screenings are held every Tuesday and Wednesday afternoon from 1:00 pm until 4:00 pm. You do not need an appointment for the interview!  °[facility] [address], [place], [place] 336-631-2330   °[facility]  336-342-8273   °[place]  336-703-3100   °[facility]  336-570-6415   ° °Behavioral Health Resources in the Community: °Intensive Outpatient Programs °Organization         Address  Phone  Notes  °[facility] [address], [place], [place] 336-878-6098   °[facility] [address], [place], [place] 336-832-9800   °[facility] [address], [place], [place] ° 336-882-2125   °[facility] [address],  °[place], [place] 1-800-853-5163 or 336-641-4981   °Substance Abuse Resources °Organization         Address  Phone  Notes  °[facility]  336-882-2125   °[facility]  336-784-9470   °[facility] [place] [facility]  336-285-9073   °[facility]  336-845-3988   °Residential & Outpatient Substance Abuse Program  1-800-659-3381   °Psychological Services °Organization         Address  Phone  Notes  °[facility]  336- 832-9600   °[facility]  336- 378-7881   °[facility] [address], [place] 1-800-853-5163 or 336-641-4981   ° °Mobile Crisis Teams °Organization         Address  Phone  Notes  °[facility], Mobile Crisis Care Unit  1-877-626-1772   °Assertive °[facility] ° [address]  [place], [place] 336-834-9664   °Sharon DeEsch [address] °[place] 336-554-5454   ° °Self-Help/Support Groups °Organization         Address  Phone             Notes  °[facility] [place] - variety of support groups  336- 373-1402 Call for more information  °Narcotics Anonymous (NA), [facility] [address], °[place]  2 meetings at this location  ° °  Residential Treatment Programs Organization         Address  Phone  Notes  [facility] [address],    [place]  1-737 377 6784   [facility]  [address] [place] [id number], [place], [place]   [facility] [place], [place] [phone] Admissions: 8am-3pm M-F  [facility] [address]    [place], [place] X4321937   [facility] [address] [place], [place], [place]   [facility] [address]  [place], [place]   [facility] - Intensive Outpatient [address], [place], [place]   [facility] ([place].,  [place], [place] 1-205-827-3314 or (308)136-9861   [facility] [address], [place], [place] Accepts Medicaid  [facility] [address]  [place] 1-204-045-2861 Substance Abuse/Addiction Treatment   [facility] Organization         Address  Phone  Notes  [facility]  4010565527   Domenic Schwab, PhD [address] [place] [address] Ar[address] [place], [place]   906-863-9190 or 330-713-4976   [facility] [place], [place] 9596017855   [facility] [address] [place], [place] 302-444-8271 Insurance/Medicaid/sponsorship through [facility] [address]                                    [place], [place] [phone] [place] [address], [place] [phone]    Dr. Adele Schilder  9203680212   [facility] Dept. 1) [address], [place] 2) [place] 3)  [place] [address], [place] 313-767-8762 734-227-2073  (631) 117-2195   [place] (816) 084-5152 or 540-637-7259 (After Hours)      Eat a bland diet, avoiding greasy, fatty, fried foods, as well as spicy and acidic foods or beverages.  Avoid eating within the hour or 2 before going to bed or laying down.  Also avoid teas, colas, coffee, chocolate, pepermint and spearment.  Take over the counter pepcid, one tablet by mouth twice a day, for the next 2 to 3 weeks.  May also take over the counter maalox/mylanta, as directed on packaging, as needed for discomfort.  Take the prescriptions as directed.  Go to the General Surgeon's office tomorrow at 10am to be seen in follow up.  Return to the Emergency Department immediately if worsening.

## 2015-02-09 NOTE — ED Notes (Signed)
Pt has had three occurences in lat 3 weeks with upper abdominal pain, rates pain 10/10.  Pt says it's like gas pain.  Pain comes and goes.

## 2015-02-09 NOTE — ED Provider Notes (Signed)
CSN: [id number]     Arrival date & time 02/09/15  1206 History   First MD Initiated Contact with Patient 02/09/15 1229     Chief Complaint  Patient presents with  . Abdominal Pain      HPI Pt was seen at 1235. Per pt and her husband, c/o gradual onset and persistence of multiple intermittent episodes of upper abd "pain" for the past several months, worse over the past few weeks. Pt describes the pain as in her upper abd "but sometimes all over," "aching," "gassy," and per her usual chronic pain pattern for several years. States the pain "comes and goes," esp after eating and lying down after eating. Pain improves when she sits up or "stands and moves around." States she stopped taking her PPI "quite a while ago" because "I was better and didn't think I needed it." Pt is also concerned regarding an unintentional 40# weight loss over the past several months. Denies CP/SOB, no back pain, no diarrhea, no black or blood in stools or emesis.    Past Medical History  Diagnosis Date  . Hypertension   . Asthma   . Shortness of breath   . GERD (gastroesophageal reflux disease)   . H/O hiatal hernia   . Anxiety   . Arthritis   . Cancer [facility])     right breast  . Epigastric abdominal pain    Past Surgical History  Procedure Laterality Date  . Septoplasty    . Mastectomy      bilateral mastectomy-right breast cancer-left taken by choice  . Cataract extraction w/phaco  07/23/2011    Procedure: CATARACT EXTRACTION PHACO AND INTRAOCULAR LENS PLACEMENT (IOC);  Surgeon: Williams Che, MD;  Location: [facility];  Service: Ophthalmology;  Laterality: Right;  CDE:9.96   Family History  Problem Relation Age of Onset  . Anesthesia problems Neg Hx   . Hypotension Neg Hx   . Malignant hyperthermia Neg Hx   . Pseudochol deficiency Neg Hx    Social History  Substance Use Topics  . Smoking status: Former Smoker -- 1.00 packs/day for 28 years    Types: Cigarettes    Quit date: 07/17/1983  . Smokeless  tobacco: None  . Alcohol Use: No    Review of Systems ROS: Statement: All systems negative except as marked or noted in the HPI; Constitutional: Negative for fever and chills. +unintentional weight loss. ; ; Eyes: Negative for eye pain, redness and discharge. ; ; ENMT: Negative for ear pain, hoarseness, nasal congestion, sinus pressure and sore throat. ; ; Cardiovascular: Negative for chest pain, palpitations, diaphoresis, dyspnea and peripheral edema. ; ; Respiratory: Negative for cough, wheezing and stridor. ; ; Gastrointestinal: +N/V, abd pain. Negative for diarrhea, blood in stool, hematemesis, jaundice and rectal bleeding. . ; ; Genitourinary: Negative for dysuria, flank pain and hematuria. ; ; Musculoskeletal: Negative for back pain and neck pain. Negative for swelling and trauma.; ; Skin: Negative for pruritus, rash, abrasions, blisters, bruising and skin lesion.; ; Neuro: Negative for headache, lightheadedness and neck stiffness. Negative for weakness, altered level of consciousness , altered mental status, extremity weakness, paresthesias, involuntary movement, seizure and syncope.      Allergies  Review of patient's allergies indicates no known allergies.  Home Medications   Prior to Admission medications   Medication Sig Start Date End Date Taking? Authorizing Provider  acetaminophen (TYLENOL) 650 MG CR tablet Take 650 mg by mouth at bedtime.    Historical Provider, MD  albuterol (PROVENTIL HFA;VENTOLIN  HFA) 108 (90 BASE) MCG/ACT inhaler Inhale 1-2 puffs into the lungs every 6 (six) hours as needed for wheezing or shortness of breath.    Historical Provider, MD  ALPRAZolam Duanne Moron) 0.5 MG tablet Take 0.5 mg by mouth 4 (four) times daily as needed. FOR SHORTNESS OF BREATH 06/18/14   Historical Provider, MD  azithromycin (ZITHROMAX) 250 MG tablet Take 1 tablet (250 mg total) by mouth once. 07/09/14   Kelvin Cellar, MD  benzonatate (TESSALON) 100 MG capsule Take 1 capsule (100 mg total) by  mouth 3 (three) times daily as needed for cough. 07/09/14   Kelvin Cellar, MD  budesonide-formoterol [facility]) 160-4.5 MCG/ACT inhaler Inhale 2 puffs into the lungs 2 (two) times daily. 07/09/14   Kelvin Cellar, MD  fluticasone (FLONASE) 50 MCG/ACT nasal spray Place 2 sprays into both nostrils daily. Allergies 07/09/14   Kelvin Cellar, MD  Glycerin-Hypromellose-PEG 400 (VISINE TEARS OP) Apply 1 drop to eye daily as needed. Watery Eyes    Historical Provider, MD  losartan-hydrochlorothiazide (HYZAAR) 100-25 MG per tablet Take 1 tablet by mouth daily.    Historical Provider, MD  predniSONE (STERAPRED UNI-PAK 21 TAB) 10 MG (21) TBPK tablet Take 6-5-4-3-2-1 tablets by mouth daily till gone. 07/09/14   Kelvin Cellar, MD  verapamil (VERELAN PM) 240 MG 24 hr capsule Take 240 mg by mouth at bedtime.    Historical Provider, MD   BP 155/70 mmHg  Pulse 86  Temp(Src) 97.7 F (36.5 C) (Oral)  Resp 18  Ht 5\' 4"  (1.626 m)  Wt 143 lb (64.864 kg)  BMI 24.53 kg/m2  SpO2 100% Physical Exam  1240: Physical examination:  Nursing notes reviewed; Vital signs and O2 SAT reviewed;  Constitutional: Well developed, Well nourished, Well hydrated, In no acute distress; Head:  Normocephalic, atraumatic; Eyes: EOMI, PERRL, No scleral icterus; ENMT: Mouth and pharynx normal, Mucous membranes moist; Neck: Supple, Full range of motion, No lymphadenopathy; Cardiovascular: Regular rate and rhythm, No gallop; Respiratory: Breath sounds clear & equal bilaterally, No wheezes.  Speaking full sentences with ease, Normal respiratory effort/excursion; Chest: Nontender, Movement normal; Abdomen: Soft, +mild diffuse tenderness to palp. No rebound or guarding. Nondistended, Normal bowel sounds; Genitourinary: No CVA tenderness; Extremities: Pulses normal, No tenderness, No edema, No calf edema or asymmetry.; Neuro: AA&Ox3, Major CN grossly intact.  Speech clear. No gross focal motor or sensory deficits in extremities.; Skin: Color  normal, Warm, Dry.   ED Course  Procedures (including critical care time)  Labs Review  Imaging Review  I have personally reviewed and evaluated these images and lab results as part of my medical decision-making.   EKG Interpretation   Date/Time:  Wednesday February 09 2015 12:54:49 EST Ventricular Rate:  80 PR Interval:  147 QRS Duration: 88 QT Interval:  357 QTC Calculation: 412 R Axis:   24 Text Interpretation:  Sinus rhythm Low voltage, precordial leads Minimal  ST depression, diffuse leads Baseline wander When compared with ECG of  02/28/2008 Rate slower Otherwise no significant change Confirmed by [facility]   MD, Nunzio Cory [phone]) on 02/09/2015 12:58:58 PM      MDM  MDM Reviewed: previous chart, nursing note and vitals Reviewed previous: labs and ECG Interpretation: labs, ECG, x-ray, CT scan and ultrasound      Results for orders placed or performed during the hospital encounter of 02/09/15  Comprehensive metabolic panel  Result Value Ref Range   Sodium 137 135 - 145 mmol/L   Potassium 3.4 (L) 3.5 - 5.1 mmol/L   Chloride  99 (L) 101 - 111 mmol/L   CO2 30 22 - 32 mmol/L   Glucose, Bld 121 (H) 65 - 99 mg/dL   BUN 12 6 - 20 mg/dL   Creatinine, Ser 0.99 0.44 - 1.00 mg/dL   Calcium 9.5 8.9 - 10.3 mg/dL   Total Protein 7.0 6.5 - 8.1 g/dL   Albumin 3.7 3.5 - 5.0 g/dL   AST 101 (H) 15 - 41 U/L   ALT 174 (H) 14 - 54 U/L   Alkaline Phosphatase 137 (H) 38 - 126 U/L   Total Bilirubin 1.2 0.3 - 1.2 mg/dL   GFR calc non Af Amer 54 (L) >60 mL/min   GFR calc Af Amer >60 >60 mL/min   Anion gap 8 5 - 15  Lipase, blood  Result Value Ref Range   Lipase 73 (H) 11 - 51 U/L  Troponin I  Result Value Ref Range   Troponin I <0.03 <0.031 ng/mL  Lactic acid, plasma  Result Value Ref Range   Lactic Acid, Venous 1.7 0.5 - 2.0 mmol/L  Lactic acid, plasma  Result Value Ref Range   Lactic Acid, Venous 1.4 0.5 - 2.0 mmol/L  CBC with Differential  Result Value Ref Range   WBC  10.7 (H) 4.0 - 10.5 K/uL   RBC 4.21 3.87 - 5.11 MIL/uL   Hemoglobin 13.0 12.0 - 15.0 g/dL   HCT 39.0 36.0 - 46.0 %   MCV 92.6 78.0 - 100.0 fL   MCH 30.9 26.0 - 34.0 pg   MCHC 33.3 30.0 - 36.0 g/dL   RDW 12.4 11.5 - 15.5 %   Platelets 275 150 - 400 K/uL   Neutrophils Relative % 70 %   Neutro Abs 7.5 1.7 - 7.7 K/uL   Lymphocytes Relative 16 %   Lymphs Abs 1.8 0.7 - 4.0 K/uL   Monocytes Relative 8 %   Monocytes Absolute 0.8 0.1 - 1.0 K/uL   Eosinophils Relative 6 %   Eosinophils Absolute 0.6 0.0 - 0.7 K/uL   Basophils Relative 0 %   Basophils Absolute 0.0 0.0 - 0.1 K/uL  Urinalysis, Routine w reflex microscopic  Result Value Ref Range   Color, Urine YELLOW YELLOW   APPearance CLEAR CLEAR   Specific Gravity, Urine 1.010 1.005 - 1.030   pH 7.5 5.0 - 8.0   Glucose, UA NEGATIVE NEGATIVE mg/dL   Hgb urine dipstick NEGATIVE NEGATIVE   Bilirubin Urine NEGATIVE NEGATIVE   Ketones, ur NEGATIVE NEGATIVE mg/dL   Protein, ur NEGATIVE NEGATIVE mg/dL   Nitrite NEGATIVE NEGATIVE   Leukocytes, UA TRACE (A) NEGATIVE  Urine microscopic-add on  Result Value Ref Range   Squamous Epithelial / LPF 6-30 (A) NONE SEEN   WBC, UA 6-30 0 - 5 WBC/hpf   RBC / HPF NONE SEEN 0 - 5 RBC/hpf   Bacteria, UA RARE (A) NONE SEEN   Dg Chest 2 View 02/09/2015  CLINICAL DATA:  Epigastric abdominal pain with nausea and vomiting and unintentional weight loss. EXAM: CHEST  2 VIEW COMPARISON:  07/07/2014 FINDINGS: The heart size and mediastinal contours are within normal limits. Both lungs are clear. The visualized skeletal structures are unremarkable. Previous mastectomies. IMPRESSION: No active cardiopulmonary disease. Electronically Signed   By: Lorriane Shire M.D.   On: 02/09/2015 13:37   Ct Abdomen Pelvis W Contrast 02/09/2015  CLINICAL DATA:  Intermittent upper abdominal pain over the last 3 weeks. History of bilateral breast cancer. Initial encounter. EXAM: CT ABDOMEN AND PELVIS WITH CONTRAST TECHNIQUE:  Multidetector CT imaging of the abdomen and pelvis was performed using the standard protocol following bolus administration of intravenous contrast. CONTRAST:  170mL OMNIPAQUE IOHEXOL 300 MG/ML  SOLN COMPARISON:  None. FINDINGS: Lower chest: Clear lung bases. No significant pleural or pericardial effusion. Hepatobiliary: The liver is normal in density without suspicious finding. There is a small calcified granuloma on image 24. There is mild intrahepatic biliary dilatation. The extrahepatic biliary system appears normal in caliber. There are small partially calcified gallstones within the gallbladder lumen. The gallbladder appears mildly distended without definite wall thickening or surrounding inflammation. Pancreas: Unremarkable. No pancreatic ductal dilatation or surrounding inflammatory changes. Spleen: There is a lobulated peripherally enhancing lesion at the splenic hilum measuring 2.0 x 2.2 cm on image 20 of series 2. This is likely an incidental hemangioma. The spleen is otherwise unremarkable in appearance and normal in size. Adrenals/Urinary Tract: Both adrenal glands appear normal. There are small renal cysts, measuring up to 3.4 cm in the lower pole. The right kidney appears normal. No evidence of urinary tract calculus or hydronephrosis. The bladder appears unremarkable. Stomach/Bowel: No evidence of bowel wall thickening, distention or surrounding inflammatory change. There are mild sigmoid colon diverticular changes. The appendix appears normal. Vascular/Lymphatic: There are no enlarged abdominal or pelvic lymph nodes. Mild atherosclerosis of the aorta, its branches and the iliac arteries. Reproductive: Unremarkable. Other: No evidence of abdominal wall mass or hernia. Musculoskeletal: No acute or significant osseous findings. Mild lumbar spine degenerative changes with disc space loss most advanced at L5-S1. There is a slight anterolisthesis at L4-5. IMPRESSION: 1. No definite acute findings or  explanation for the patient's symptoms. 2. There is evidence of cholelithiasis with mild gallbladder distention and intrahepatic biliary prominence, but no definite wall thickening or surrounding inflammation. The pancreas appears normal by CT. The patient's mildly elevated liver function studies and lipase level could be due to a recently passed common duct stone. Correlate clinically. 3. Enhancing splenic lesion, likely an incidental hemangioma. 4. Left renal cysts, mild atherosclerosis and sigmoid diverticulosis noted. Electronically Signed   By: Richardean Sale M.D.   On: 02/09/2015 14:34   US Abdomen Limited 02/09/2015  CLINICAL DATA:  Abdominal pain. Gallstones on CT. Initial encounter. EXAM: US ABDOMEN LIMITED - RIGHT UPPER QUADRANT COMPARISON:  CT same date. FINDINGS: Gallbladder: Small gallstones are noted. There is no gallbladder wall thickening, pericholecystic fluid or sonographic Murphy's sign. Common bile duct: Diameter: 6.9 mm.  No common duct stones visualized. Liver: No focal lesion identified. Within normal limits in parenchymal echogenicity. IMPRESSION: 1. Cholelithiasis without evidence of cholecystitis or biliary dilatation. Electronically Signed   By: Richardean Sale M.D.   On: 02/09/2015 16:36    1705:  LFT's elevated, no old to compare. Dx and testing d/w pt and family.  Questions answered.  Verb understanding. Pt states she "feels better" after meds and wants to go home now. Pt states she does not want to be admitted to the hospital or stay in the ED any longer because she "has to check on the dog."  Refuses to have husband leave and check on dog without her. Encouraged again to be admitted; pt refuses. Pt makes her own medical decisions. Risks of leaving explained. Pt verb understanding and continues to refuse to stay, understanding the consequences of her decision. Pt is agreeable to wait in ED until I speak with General Surgeon regarding her LFT's and CT/US results. T/C to General  Surgery Dr. Arnoldo Morale, case discussed, including:  HPI, pertinent PM/SHx,  VS/PE, dx testing, ED course and treatment:  States pt does not need emergent GB removal at this time; if pt wants to leave, he will see her in the office tomorrow morning at 10am to discuss removing her GB. D/W General Surgeon d/w pt and family.  Questions answered.  Verb understanding, agreeable to see Dr. Arnoldo Morale in the office tomorrow at [place], DO 02/13/15 1922

## 2015-02-09 NOTE — ED Notes (Signed)
MD McManus at bedside. 

## 2015-02-11 ENCOUNTER — Encounter (HOSPITAL_COMMUNITY): Payer: Self-pay

## 2015-02-11 ENCOUNTER — Encounter (HOSPITAL_COMMUNITY)
Admission: RE | Admit: 2015-02-11 | Discharge: 2015-02-11 | Disposition: A | Discharge status: Home / Self Care | Payer: Commercial Managed Care - HMO | Source: Ambulatory Visit | Attending: General Surgery | Admitting: General Surgery

## 2015-02-11 NOTE — H&P (Signed)
  NTS SOAP Note  Vital Signs:  Vitals as of: 123456: Systolic XX123456: Diastolic 86: Heart Rate 92: Temp 96.26F: Height 60ft 4in: Weight 149Lbs 0 Ounces: Pain Level 6: BMI 25.58  BMI : 25.58 kg/m2  Subjective: This 74 year old female presents for of biliary colic secondary to cholelithiasis.  Was seen in ER, found to have cholelithiasis, 66mm CBD, no choledocholithiasis.  LFT's wnl.  Has been having symptoms of right upper quadrant abdominal pain, nausea, and indigestion for some time now.  No fever, chills, jaundice.  Review of Symptoms:    Past Medical History:  Reviewed  Past Medical History  Surgical History: bilateral mastectomy  Medical Problems: breast cancer,  HTN,  asthma Allergies: nkda Medications: prednisone,  verapamil,  erythromycin,  flonase,  symbicort   Social History:Reviewed  Social History  Preferred Language: English Race:  White Ethnicity: Not Hispanic / Latino Age: 74 year Marital Status:  M Alcohol: no   Smoking Status: Never smoker reviewed on 02/10/2015 Functional Status reviewed on 02/10/2015 ------------------------------------------------ Bathing: Normal Cooking: Normal Dressing: Normal Driving: Normal Eating: Normal Managing Meds: Normal Oral Care: Normal Shopping: Normal Toileting: Normal Transferring: Normal Walking: Normal Cognitive Status reviewed on 02/10/2015 ------------------------------------------------ Attention: Normal Decision Making: Normal Language: Normal Memory: Normal Motor: Normal Perception: Normal Problem Solving: Normal Visual and Spatial: Normal   Family History:Reviewed  Family Health History Family History is Unknown    Objective Information: General:Well appearing, well nourished in no distress. Head:Size:  Lesions:   no scleral icterus Neck:Supple without lymphadenopathy.  Heart:RRR, no murmur Lungs:  CTA bilaterally, no wheezes, rhonchi, rales.  Breathing  unlabored. Abdomen:Soft, NT/ND, no HSM, no masses.  Assessment:Biliary colic, cholelithiasis  Diagnoses: 574.20  K80.20 Gallstone (Calculus of gallbladder without cholecystitis without obstruction)  Procedures: [id number] - OFFICE OUTPATIENT VISIT 25 MINUTES    Plan:  Scheduled for laparosopic cholecystectomy on 02/14/15.   Patient Education:Alternative treatments to surgery were discussed with patient (and family).  Risks and benefits  of procedure incluidng bleeding infection, hepatobiliar injury, and the possibility of an open procedure were fully explained to the patient (and family) who gave informed consent. Patient/family questions were addressed.  Follow-up:Pending Surgery

## 2015-02-14 ENCOUNTER — Encounter (HOSPITAL_COMMUNITY)
Admission: RE | Disposition: A | Discharge status: Home / Self Care | Payer: Self-pay | Source: Ambulatory Visit | Attending: General Surgery

## 2015-02-14 ENCOUNTER — Ambulatory Visit (HOSPITAL_COMMUNITY): Payer: Commercial Managed Care - HMO | Admitting: Anesthesiology

## 2015-02-14 ENCOUNTER — Ambulatory Visit (HOSPITAL_COMMUNITY)
Admission: RE | Admit: 2015-02-14 | Discharge: 2015-02-14 | Disposition: A | Discharge status: Home / Self Care | Payer: Commercial Managed Care - HMO | Source: Ambulatory Visit | Attending: General Surgery | Admitting: General Surgery

## 2015-02-14 ENCOUNTER — Encounter (HOSPITAL_COMMUNITY): Payer: Self-pay | Admitting: *Deleted

## 2015-02-14 DIAGNOSIS — J45909 Unspecified asthma, uncomplicated: Secondary | ICD-10-CM | POA: Diagnosis not present

## 2015-02-14 DIAGNOSIS — F419 Anxiety disorder, unspecified: Secondary | ICD-10-CM | POA: Insufficient documentation

## 2015-02-14 DIAGNOSIS — Z9013 Acquired absence of bilateral breasts and nipples: Secondary | ICD-10-CM | POA: Diagnosis not present

## 2015-02-14 DIAGNOSIS — I10 Essential (primary) hypertension: Secondary | ICD-10-CM | POA: Diagnosis not present

## 2015-02-14 DIAGNOSIS — K8020 Calculus of gallbladder without cholecystitis without obstruction: Secondary | ICD-10-CM | POA: Diagnosis present

## 2015-02-14 DIAGNOSIS — Z7951 Long term (current) use of inhaled steroids: Secondary | ICD-10-CM | POA: Insufficient documentation

## 2015-02-14 DIAGNOSIS — Z853 Personal history of malignant neoplasm of breast: Secondary | ICD-10-CM | POA: Diagnosis not present

## 2015-02-14 DIAGNOSIS — K8010 Calculus of gallbladder with chronic cholecystitis without obstruction: Secondary | ICD-10-CM | POA: Diagnosis not present

## 2015-02-14 DIAGNOSIS — Z8673 Personal history of transient ischemic attack (TIA), and cerebral infarction without residual deficits: Secondary | ICD-10-CM | POA: Diagnosis not present

## 2015-02-14 DIAGNOSIS — Z79899 Other long term (current) drug therapy: Secondary | ICD-10-CM | POA: Insufficient documentation

## 2015-02-14 DIAGNOSIS — Z87891 Personal history of nicotine dependence: Secondary | ICD-10-CM | POA: Diagnosis not present

## 2015-02-14 HISTORY — PX: CHOLECYSTECTOMY: SHX55

## 2015-02-14 SURGERY — LAPAROSCOPIC CHOLECYSTECTOMY
Anesthesia: General | Site: Abdomen

## 2015-02-14 MED ORDER — FENTANYL CITRATE (PF) 250 MCG/5ML IJ SOLN
INTRAMUSCULAR | Status: AC
  Filled 2015-02-14: qty 5

## 2015-02-14 MED ORDER — KETOROLAC TROMETHAMINE 30 MG/ML IJ SOLN
30.0000 mg | Freq: Once | INTRAMUSCULAR | Status: AC
  Administered 2015-02-14: 30 mg via INTRAVENOUS
  Filled 2015-02-14: qty 1

## 2015-02-14 MED ORDER — MIDAZOLAM HCL 5 MG/5ML IJ SOLN
INTRAMUSCULAR | Status: DC | PRN
  Administered 2015-02-14: 2 mg via INTRAVENOUS

## 2015-02-14 MED ORDER — MIDAZOLAM HCL 2 MG/2ML IJ SOLN
INTRAMUSCULAR | Status: AC
  Filled 2015-02-14: qty 2

## 2015-02-14 MED ORDER — ROCURONIUM BROMIDE 100 MG/10ML IV SOLN
INTRAVENOUS | Status: DC | PRN
  Administered 2015-02-14: 20 mg via INTRAVENOUS

## 2015-02-14 MED ORDER — BUPIVACAINE HCL (PF) 0.5 % IJ SOLN
INTRAMUSCULAR | Status: DC | PRN
  Administered 2015-02-14: 10 mL

## 2015-02-14 MED ORDER — FENTANYL CITRATE (PF) 100 MCG/2ML IJ SOLN
INTRAMUSCULAR | Status: DC | PRN
  Administered 2015-02-14 (×2): 50 ug via INTRAVENOUS

## 2015-02-14 MED ORDER — BUPIVACAINE HCL (PF) 0.5 % IJ SOLN
INTRAMUSCULAR | Status: AC
  Filled 2015-02-14: qty 30

## 2015-02-14 MED ORDER — SUCCINYLCHOLINE CHLORIDE 20 MG/ML IJ SOLN
INTRAMUSCULAR | Status: DC | PRN
  Administered 2015-02-14: 100 mg via INTRAVENOUS

## 2015-02-14 MED ORDER — HYDROCODONE-ACETAMINOPHEN 5-325 MG PO TABS: 1.0000 | ORAL_TABLET | Freq: Four times a day (QID) | ORAL | Status: AC | PRN

## 2015-02-14 MED ORDER — SCOPOLAMINE 1 MG/3DAYS TD PT72
MEDICATED_PATCH | TRANSDERMAL | Status: AC
  Filled 2015-02-14: qty 1

## 2015-02-14 MED ORDER — CHLORHEXIDINE GLUCONATE 4 % EX LIQD: 1.0000 "application " | Freq: Once | CUTANEOUS | Status: DC

## 2015-02-14 MED ORDER — PROPOFOL 10 MG/ML IV BOLUS
INTRAVENOUS | Status: AC
  Filled 2015-02-14: qty 20

## 2015-02-14 MED ORDER — EPHEDRINE SULFATE 50 MG/ML IJ SOLN
INTRAMUSCULAR | Status: DC | PRN
  Administered 2015-02-14: 10 mg via INTRAVENOUS
  Administered 2015-02-14: 5 mg via INTRAVENOUS

## 2015-02-14 MED ORDER — ONDANSETRON HCL 4 MG/2ML IJ SOLN: 4.0000 mg | Freq: Once | INTRAMUSCULAR | Status: DC | PRN

## 2015-02-14 MED ORDER — GLYCOPYRROLATE 0.2 MG/ML IJ SOLN
INTRAMUSCULAR | Status: AC
  Filled 2015-02-14: qty 3

## 2015-02-14 MED ORDER — ONDANSETRON HCL 4 MG/2ML IJ SOLN
4.0000 mg | Freq: Once | INTRAMUSCULAR | Status: AC
  Administered 2015-02-14: 4 mg via INTRAVENOUS

## 2015-02-14 MED ORDER — NEOSTIGMINE METHYLSULFATE 10 MG/10ML IV SOLN
INTRAVENOUS | Status: DC | PRN
  Administered 2015-02-14: 1 mg via INTRAVENOUS
  Administered 2015-02-14: 2 mg via INTRAVENOUS

## 2015-02-14 MED ORDER — MIDAZOLAM HCL 2 MG/2ML IJ SOLN
1.0000 mg | INTRAMUSCULAR | Status: DC | PRN
  Administered 2015-02-14: 2 mg via INTRAVENOUS

## 2015-02-14 MED ORDER — FENTANYL CITRATE (PF) 100 MCG/2ML IJ SOLN
25.0000 ug | INTRAMUSCULAR | Status: DC | PRN
  Administered 2015-02-14 (×2): 25 ug via INTRAVENOUS
  Filled 2015-02-14: qty 2

## 2015-02-14 MED ORDER — LIDOCAINE HCL 1 % IJ SOLN
INTRAMUSCULAR | Status: DC | PRN
Start: 2015-02-14 — End: 2015-02-14
  Administered 2015-02-14: 25 mg via INTRADERMAL

## 2015-02-14 MED ORDER — POVIDONE-IODINE 10 % OINT PACKET
TOPICAL_OINTMENT | CUTANEOUS | Status: DC | PRN
  Administered 2015-02-14: 1 via TOPICAL

## 2015-02-14 MED ORDER — DEXAMETHASONE SODIUM PHOSPHATE 4 MG/ML IJ SOLN
INTRAMUSCULAR | Status: AC
  Filled 2015-02-14: qty 1

## 2015-02-14 MED ORDER — POVIDONE-IODINE 10 % EX OINT
TOPICAL_OINTMENT | CUTANEOUS | Status: AC
  Filled 2015-02-14: qty 1

## 2015-02-14 MED ORDER — GLYCOPYRROLATE 0.2 MG/ML IJ SOLN
INTRAMUSCULAR | Status: DC | PRN
  Administered 2015-02-14: 0.6 mg via INTRAVENOUS

## 2015-02-14 MED ORDER — HEMOSTATIC AGENTS (NO CHARGE) OPTIME
TOPICAL | Status: DC | PRN
  Administered 2015-02-14: 1 via TOPICAL

## 2015-02-14 MED ORDER — HYDROCODONE-ACETAMINOPHEN 5-325 MG PO TABS: 1.0000 | ORAL_TABLET | Freq: Four times a day (QID) | ORAL | Status: DC | PRN

## 2015-02-14 MED ORDER — LACTATED RINGERS IV SOLN
INTRAVENOUS | Status: DC
  Administered 2015-02-14: 1000 mL via INTRAVENOUS

## 2015-02-14 MED ORDER — SODIUM CHLORIDE 0.9 % IR SOLN
Status: DC | PRN
  Administered 2015-02-14: 1000 mL

## 2015-02-14 MED ORDER — ONDANSETRON HCL 4 MG/2ML IJ SOLN
INTRAMUSCULAR | Status: AC
  Filled 2015-02-14: qty 2

## 2015-02-14 MED ORDER — CIPROFLOXACIN IN D5W 400 MG/200ML IV SOLN
400.0000 mg | INTRAVENOUS | Status: AC
  Administered 2015-02-14: 400 mg via INTRAVENOUS
  Filled 2015-02-14: qty 200

## 2015-02-14 MED ORDER — SCOPOLAMINE 1 MG/3DAYS TD PT72
1.0000 | MEDICATED_PATCH | Freq: Once | TRANSDERMAL | Status: DC
  Administered 2015-02-14: 1.5 mg via TRANSDERMAL

## 2015-02-14 MED ORDER — PROPOFOL 10 MG/ML IV BOLUS
INTRAVENOUS | Status: DC | PRN
  Administered 2015-02-14: 120 mg via INTRAVENOUS

## 2015-02-14 MED ORDER — DEXAMETHASONE SODIUM PHOSPHATE 4 MG/ML IJ SOLN
4.0000 mg | Freq: Once | INTRAMUSCULAR | Status: AC
  Administered 2015-02-14: 4 mg via INTRAVENOUS

## 2015-02-14 SURGICAL SUPPLY — 44 items
APPLIER CLIP LAPSCP 10X32 DD (CLIP) ×3 IMPLANT
BAG HAMPER (MISCELLANEOUS) ×3 IMPLANT
BAG SPEC RTRVL LRG 6X4 10 (ENDOMECHANICALS) ×1
CHLORAPREP W/TINT 26ML (MISCELLANEOUS) ×3 IMPLANT
CLOTH BEACON ORANGE TIMEOUT ST (SAFETY) ×3 IMPLANT
COVER LIGHT HANDLE STERIS (MISCELLANEOUS) ×6 IMPLANT
DECANTER SPIKE VIAL GLASS SM (MISCELLANEOUS) ×3 IMPLANT
ELECT REM PT RETURN 9FT ADLT (ELECTROSURGICAL) ×3
ELECTRODE REM PT RTRN 9FT ADLT (ELECTROSURGICAL) ×1 IMPLANT
FILTER SMOKE EVAC LAPAROSHD (FILTER) ×3 IMPLANT
FORMALIN 10 PREFIL 120ML (MISCELLANEOUS) ×3 IMPLANT
GLOVE BIOGEL PI IND STRL 7.0 (GLOVE) ×1 IMPLANT
GLOVE BIOGEL PI INDICATOR 7.0 (GLOVE) ×6
GLOVE ECLIPSE 6.5 STRL STRAW (GLOVE) ×4 IMPLANT
GLOVE EXAM NITRILE MD LF STRL (GLOVE) ×2 IMPLANT
GLOVE SURG SS PI 7.5 STRL IVOR (GLOVE) ×3 IMPLANT
GOWN STRL REUS W/ TWL XL LVL3 (GOWN DISPOSABLE) ×1 IMPLANT
GOWN STRL REUS W/TWL LRG LVL3 (GOWN DISPOSABLE) ×6 IMPLANT
GOWN STRL REUS W/TWL XL LVL3 (GOWN DISPOSABLE) ×3
HEMOSTAT SNOW SURGICEL 2X4 (HEMOSTASIS) ×3 IMPLANT
INST SET LAPROSCOPIC AP (KITS) ×3 IMPLANT
IV NS IRRIG 3000ML ARTHROMATIC (IV SOLUTION) IMPLANT
KIT ROOM TURNOVER APOR (KITS) ×3 IMPLANT
MANIFOLD NEPTUNE II (INSTRUMENTS) ×3 IMPLANT
NDL INSUFFLATION 14GA 120MM (NEEDLE) ×1 IMPLANT
NEEDLE INSUFFLATION 14GA 120MM (NEEDLE) ×3 IMPLANT
NS IRRIG 1000ML POUR BTL (IV SOLUTION) ×3 IMPLANT
PACK LAP CHOLE LZT030E (CUSTOM PROCEDURE TRAY) ×3 IMPLANT
PAD ARMBOARD 7.5X6 YLW CONV (MISCELLANEOUS) ×3 IMPLANT
POUCH SPECIMEN RETRIEVAL 10MM (ENDOMECHANICALS) ×3 IMPLANT
SET BASIN LINEN APH (SET/KITS/TRAYS/PACK) ×3 IMPLANT
SET TUBE IRRIG SUCTION NO TIP (IRRIGATION / IRRIGATOR) IMPLANT
SLEEVE ENDOPATH XCEL 5M (ENDOMECHANICALS) ×3 IMPLANT
SPONGE GAUZE 2X2 8PLY STER LF (GAUZE/BANDAGES/DRESSINGS) ×4
SPONGE GAUZE 2X2 8PLY STRL LF (GAUZE/BANDAGES/DRESSINGS) ×8 IMPLANT
STAPLER VISISTAT (STAPLE) ×3 IMPLANT
SUT VICRYL 0 UR6 27IN ABS (SUTURE) ×3 IMPLANT
TAPE CLOTH SURG 4X10 WHT LF (GAUZE/BANDAGES/DRESSINGS) ×2 IMPLANT
TROCAR ENDO BLADELESS 11MM (ENDOMECHANICALS) ×3 IMPLANT
TROCAR XCEL NON-BLD 5MMX100MML (ENDOMECHANICALS) ×3 IMPLANT
TROCAR XCEL UNIV SLVE 11M 100M (ENDOMECHANICALS) ×3 IMPLANT
TUBING INSUFFLATION (TUBING) ×3 IMPLANT
WARMER LAPAROSCOPE (MISCELLANEOUS) ×3 IMPLANT
YANKAUER SUCT 12FT TUBE ARGYLE (SUCTIONS) ×3 IMPLANT

## 2015-02-14 NOTE — Anesthesia Postprocedure Evaluation (Signed)
  Anesthesia Post-op Note  Patient: Bailey Bishop  Procedure(s) Performed: Procedure(s): LAPAROSCOPIC CHOLECYSTECTOMY (N/A)  Patient Location: PACU  Anesthesia Type:General  Level of Consciousness: awake, alert , oriented and patient cooperative  Airway and Oxygen Therapy: Patient Spontanous Breathing  Post-op Pain: 3 /10, mild  Post-op Assessment: Post-op Vital signs reviewed, Patient's Cardiovascular Status Stable, Respiratory Function Stable, Patent Airway, No signs of Nausea or vomiting and Pain level controlled              Post-op Vital Signs: Reviewed and stable  Last Vitals:  Filed Vitals:   02/14/15 0730 02/14/15 0834  BP: 144/79 134/67  Pulse:  65  Temp:  36.6 C  Resp: 16 19    Complications: No apparent anesthesia complications

## 2015-02-14 NOTE — Transfer of Care (Signed)
Immediate Anesthesia Transfer of Care Note  Patient: Bailey Bishop  Procedure(s) Performed: Procedure(s): LAPAROSCOPIC CHOLECYSTECTOMY (N/A)  Patient Location: PACU  Anesthesia Type:General  Level of Consciousness: awake and patient cooperative  Airway & Oxygen Therapy: Patient Spontanous Breathing and Patient connected to face mask oxygen  Post-op Assessment: Report given to RN, Post -op Vital signs reviewed and stable and Patient moving all extremities  Post vital signs: Reviewed and stable  Last Vitals:  Filed Vitals:   02/14/15 0725 02/14/15 0730  BP: 155/80 144/79  Temp:    Resp: 17 16    Complications: No apparent anesthesia complications

## 2015-02-14 NOTE — Interval H&P Note (Signed)
History and Physical Interval Note:  02/14/2015 7:25 AM  Bailey Bishop  has presented today for surgery, with the diagnosis of cholelithiasis  The various methods of treatment have been discussed with the patient and family. After consideration of risks, benefits and other options for treatment, the patient has consented to  Procedure(s): LAPAROSCOPIC CHOLECYSTECTOMY (N/A) as a surgical intervention .  The patient's history has been reviewed, patient examined, no change in status, stable for surgery.  I have reviewed the patient's chart and labs.  Questions were answered to the patient's satisfaction.     Aviva Signs A

## 2015-02-14 NOTE — Anesthesia Preprocedure Evaluation (Signed)
Anesthesia Evaluation  Patient identified by MRN, date of birth, ID band Patient awake    Reviewed: Allergy & Precautions, H&P , NPO status , Patient's Chart, lab work & pertinent test results  History of Anesthesia Complications Negative for: history of anesthetic complications  Airway Mallampati: II  TM Distance: >3 FB     Dental  (+) Edentulous Upper, Partial Lower   Pulmonary shortness of breath and with exertion, asthma , former smoker,    breath sounds clear to auscultation       Cardiovascular hypertension, Pt. on medications  Rhythm:Regular Rate:Normal     Neuro/Psych Anxiety TIA   GI/Hepatic hiatal hernia, GERD  Medicated and Controlled,  Endo/Other    Renal/GU      Musculoskeletal   Abdominal   Peds  Hematology   Anesthesia Other Findings   Reproductive/Obstetrics                             Anesthesia Physical Anesthesia Plan  ASA: III  Anesthesia Plan: General   Post-op Pain Management:    Induction: Intravenous  Airway Management Planned: Oral ETT  Additional Equipment:   Intra-op Plan:   Post-operative Plan: Extubation in OR  Informed Consent: I have reviewed the patients History and Physical, chart, labs and discussed the procedure including the risks, benefits and alternatives for the proposed anesthesia with the patient or authorized representative who has indicated his/her understanding and acceptance.     Plan Discussed with:   Anesthesia Plan Comments:         Anesthesia Quick Evaluation

## 2015-02-14 NOTE — Anesthesia Procedure Notes (Signed)
Procedure Name: Intubation Date/Time: 02/14/2015 7:45 AM Performed by: Charmaine Downs Pre-anesthesia Checklist: Emergency Drugs available, Suction available, Patient being monitored and Patient identified Patient Re-evaluated:Patient Re-evaluated prior to inductionOxygen Delivery Method: Circle system utilized Preoxygenation: Pre-oxygenation with 100% oxygen Intubation Type: IV induction, Rapid sequence and Cricoid Pressure applied Ventilation: Mask ventilation without difficulty Laryngoscope Size: Mac and 3 Grade View: Grade II Tube type: Oral Tube size: 7.0 mm Number of attempts: 1 Airway Equipment and Method: Stylet and Oral airway Placement Confirmation: ETT inserted through vocal cords under direct vision,  positive ETCO2 and breath sounds checked- equal and bilateral Secured at: 22 cm Tube secured with: Tape Dental Injury: Teeth and Oropharynx as per pre-operative assessment

## 2015-02-14 NOTE — Op Note (Signed)
Patient:  Bailey Bishop  DOB:  03/12/1941  MRN:  [id number]   Preop Diagnosis:  Biliary colic, cholelithiasis  Postop Diagnosis:  Same  Procedure:  Laparoscopic cholecystectomy  Surgeon:  Aviva Signs, M.D.  Anes:  Gen. endotracheal  Indications:  Patient is a 74 year old white female who presents with biliary colic secondary to cholelithiasis. The risks and benefits of the procedure including bleeding, infection, hepatobiliary injury, and the possibility of an open procedure were fully explained to the patient, who gave informed consent.  Procedure note:  The patient was placed in the supine position. After induction of general endotracheal anesthesia, the abdomen was prepped and draped using the usual sterile technique with DuraPrep. Surgical site confirmation was performed.  A supraumbilical incision was made down to the fascia. A Veress needle was introduced into the abdominal cavity and confirmation of placement was done using the saline drop test. The abdomen was then insufflated to 16 mmHg pressure. An 11 mm trocar was introduced into the abdominal cavity under direct visualization without difficulty. The patient was placed in reverse Trendelenburg position and additional 11 mm trocar was placed the epigastric region a 5 mm trochars were placed the right upper quadrant and right flank regions. The liver was inspected and noted to be within normal limits. The gallbladder was retracted in a dynamic fashion in order to provide a critical view of the triangle of Calot. The cystic duct was first identified. Its junction to the infundibulum was fully identified. Endoclips placed proximally and distally on the cystic duct, and the cystic duct was divided. This was likewise done to the cystic artery. The gallbladder was freed away from the gallbladder fossa using Bovie electrocautery. The gallbladder was delivered through the epigastric trocar site using an Endo Catch bag. The gallbladder  fossa was inspected and no abnormal bleeding or bile leakage was noted. Surgicel was placed the gallbladder fossa. All fluid and air were then evacuated from the abdominal cavity prior to removal of the trochars.  All wounds were irrigated with normal saline. All wounds were injected with 0.5% Sensorcaine. The supraumbilical fascia was reapproximated using an 0 Vicryl interrupted suture. All skin incisions were closed using staples. Betadine ointment and dry sterile dressings were applied.  All tape and needle counts were correct at the end of the procedure. Patient was extubated in the operating room and transferred to PACU in stable condition.  Complications:  None  EBL:  Minimal  Specimen:  Gallbladder

## 2015-02-14 NOTE — Discharge Instructions (Signed)
Laparoscopic Cholecystectomy, Care After °Refer to this sheet in the next few weeks. These instructions provide you with information about caring for yourself after your procedure. Your health care provider may also give you more specific instructions. Your treatment has been planned according to current medical practices, but problems sometimes occur. Call your health care provider if you have any problems or questions after your procedure. °WHAT TO EXPECT AFTER THE PROCEDURE °After your procedure, it is common to have: °· Pain at your incision sites. You will be given pain medicines to control your pain. °· Mild nausea or vomiting. This should improve after the first 24 hours. °· Bloating and possible shoulder pain from the gas that was used during the procedure. This will improve after the first 24 hours. °HOME CARE INSTRUCTIONS °Incision Care °· Follow instructions from your health care provider about how to take care of your incisions. Make sure you: °¨ Wash your hands with soap and water before you change your bandage (dressing). If soap and water are not available, use hand sanitizer. °¨ Change your dressing as told by your health care provider. °¨ Leave stitches (sutures), skin glue, or adhesive strips in place. These skin closures may need to be in place for 2 weeks or longer. If adhesive strip edges start to loosen and curl up, you may trim the loose edges. Do not remove adhesive strips completely unless your health care provider tells you to do that. °· Do not take baths, swim, or use a hot tub until your health care provider approves. Ask your health care provider if you can take showers. You may only be allowed to take sponge baths for bathing. °General Instructions °· Take over-the-counter and prescription medicines only as told by your health care provider. °· Do not drive or operate heavy machinery while taking prescription pain medicine. °· Return to your normal diet as told by your health care  provider. °· Do not lift anything that is heavier than 10 lb (4.5 kg). °· Do not play contact sports for one week or until your health care provider approves. °SEEK MEDICAL CARE IF:  °· You have redness, swelling, or pain at the site of your incision. °· You have fluid, blood, or pus coming from your incision. °· You notice a bad smell coming from your incision area. °· Your surgical incisions break open. °· You have a fever. °SEEK IMMEDIATE MEDICAL CARE IF: °· You develop a rash. °· You have difficulty breathing. °· You have chest pain. °· You have increasing pain in your shoulders (shoulder strap areas). °· You faint or have dizzy episodes while you are standing. °· You have severe pain in your abdomen. °· You have nausea or vomiting that lasts for more than one day. °  °This information is not intended to replace advice given to you by your health care provider. Make sure you discuss any questions you have with your health care provider. °  °Document Released: 03/12/2005 Document Revised: 12/01/2014 Document Reviewed: 10/22/2012 °Elsevier Interactive Patient Education ©2016 [employer] ° °

## 2015-02-15 ENCOUNTER — Encounter (HOSPITAL_COMMUNITY): Payer: Self-pay | Admitting: General Surgery

## 2015-04-12 DIAGNOSIS — Z1389 Encounter for screening for other disorder: Secondary | ICD-10-CM | POA: Diagnosis not present

## 2015-04-12 DIAGNOSIS — H6990 Unspecified Eustachian tube disorder, unspecified ear: Secondary | ICD-10-CM | POA: Diagnosis not present

## 2015-04-12 DIAGNOSIS — J019 Acute sinusitis, unspecified: Secondary | ICD-10-CM | POA: Diagnosis not present

## 2015-06-21 DIAGNOSIS — K219 Gastro-esophageal reflux disease without esophagitis: Secondary | ICD-10-CM | POA: Diagnosis not present

## 2015-06-21 DIAGNOSIS — Z1389 Encounter for screening for other disorder: Secondary | ICD-10-CM | POA: Diagnosis not present

## 2015-06-21 DIAGNOSIS — E782 Mixed hyperlipidemia: Secondary | ICD-10-CM | POA: Diagnosis not present

## 2015-06-21 DIAGNOSIS — Z23 Encounter for immunization: Secondary | ICD-10-CM | POA: Diagnosis not present

## 2015-07-15 DIAGNOSIS — H524 Presbyopia: Secondary | ICD-10-CM | POA: Diagnosis not present

## 2015-07-15 DIAGNOSIS — H52223 Regular astigmatism, bilateral: Secondary | ICD-10-CM | POA: Diagnosis not present

## 2015-07-15 DIAGNOSIS — H5201 Hypermetropia, right eye: Secondary | ICD-10-CM | POA: Diagnosis not present

## 2015-09-13 DIAGNOSIS — Z1389 Encounter for screening for other disorder: Secondary | ICD-10-CM | POA: Diagnosis not present

## 2015-09-13 DIAGNOSIS — I10 Essential (primary) hypertension: Secondary | ICD-10-CM | POA: Diagnosis not present

## 2015-09-13 DIAGNOSIS — E782 Mixed hyperlipidemia: Secondary | ICD-10-CM | POA: Diagnosis not present

## 2015-09-13 DIAGNOSIS — J4520 Mild intermittent asthma, uncomplicated: Secondary | ICD-10-CM | POA: Diagnosis not present

## 2015-09-14 ENCOUNTER — Other Ambulatory Visit (HOSPITAL_COMMUNITY): Payer: Self-pay | Admitting: Family Medicine

## 2015-09-14 DIAGNOSIS — M810 Age-related osteoporosis without current pathological fracture: Secondary | ICD-10-CM

## 2015-09-21 ENCOUNTER — Ambulatory Visit (HOSPITAL_COMMUNITY)
Admission: RE | Admit: 2015-09-21 | Discharge: 2015-09-21 | Disposition: A | Discharge status: Home / Self Care | Payer: Medicare Other | Source: Ambulatory Visit | Attending: Family Medicine | Admitting: Family Medicine

## 2015-09-21 DIAGNOSIS — I10 Essential (primary) hypertension: Secondary | ICD-10-CM | POA: Diagnosis not present

## 2015-09-21 DIAGNOSIS — E782 Mixed hyperlipidemia: Secondary | ICD-10-CM | POA: Insufficient documentation

## 2015-09-21 DIAGNOSIS — M810 Age-related osteoporosis without current pathological fracture: Secondary | ICD-10-CM

## 2015-11-15 DIAGNOSIS — E782 Mixed hyperlipidemia: Secondary | ICD-10-CM | POA: Diagnosis not present

## 2015-11-15 DIAGNOSIS — I10 Essential (primary) hypertension: Secondary | ICD-10-CM | POA: Diagnosis not present

## 2015-11-15 DIAGNOSIS — Z1389 Encounter for screening for other disorder: Secondary | ICD-10-CM | POA: Diagnosis not present

## 2015-11-15 DIAGNOSIS — J302 Other seasonal allergic rhinitis: Secondary | ICD-10-CM | POA: Diagnosis not present

## 2015-12-05 DIAGNOSIS — Z23 Encounter for immunization: Secondary | ICD-10-CM | POA: Diagnosis not present

## 2016-03-23 DIAGNOSIS — R062 Wheezing: Secondary | ICD-10-CM | POA: Diagnosis not present

## 2016-03-23 DIAGNOSIS — J343 Hypertrophy of nasal turbinates: Secondary | ICD-10-CM | POA: Diagnosis not present

## 2016-03-23 DIAGNOSIS — R070 Pain in throat: Secondary | ICD-10-CM | POA: Diagnosis not present

## 2016-03-23 DIAGNOSIS — E782 Mixed hyperlipidemia: Secondary | ICD-10-CM | POA: Diagnosis not present

## 2016-03-30 DIAGNOSIS — L659 Nonscarring hair loss, unspecified: Secondary | ICD-10-CM | POA: Diagnosis not present

## 2016-03-30 DIAGNOSIS — Z1389 Encounter for screening for other disorder: Secondary | ICD-10-CM | POA: Diagnosis not present

## 2016-04-24 ENCOUNTER — Encounter: Payer: Self-pay | Admitting: Orthopaedic Surgery

## 2016-04-24 ENCOUNTER — Ambulatory Visit (INDEPENDENT_AMBULATORY_CARE_PROVIDER_SITE_OTHER): Payer: Medicare Other

## 2016-04-24 ENCOUNTER — Ambulatory Visit (INDEPENDENT_AMBULATORY_CARE_PROVIDER_SITE_OTHER): Payer: Medicare Other | Admitting: Orthopaedic Surgery

## 2016-04-24 VITALS — BP 189/96 | HR 101 | Temp 97.7°F | Ht 63.0 in | Wt 152.0 lb

## 2016-04-24 DIAGNOSIS — M5441 Lumbago with sciatica, right side: Secondary | ICD-10-CM | POA: Diagnosis not present

## 2016-04-24 NOTE — Progress Notes (Signed)
Subjective:    [name], female    DOB: [birth year], 76 y.o.   MRN: [id number]  HPI She has pain running down from her right hip to the back of the right knee, running laterally on the right lower leg to the lateral side of the right foot.  She has no trauma, she has no redness.  She thinks her pain is of the knee. She has tried ice, heat and Advil with no help.  She has no swelling of the right knee, no giving way.  She has no pain of the right hip.  I last saw her in 1989 when she had a fracture of the right wrist.   Review of Systems  HENT: Negative for congestion.   Respiratory: Negative for cough and shortness of breath.   Cardiovascular: Negative for chest pain and leg swelling.  Endocrine: Negative for cold intolerance.  Musculoskeletal: Positive for arthralgias, back pain and gait problem.  Allergic/Immunologic: Negative for environmental allergies.   Past Medical History:  Diagnosis Date  . Anxiety   . Arthritis   . Asthma   . Cancer ([place])    right breast  . Epigastric abdominal pain   . GERD (gastroesophageal reflux disease)   . H/O hiatal hernia   . Hypertension   . Shortness of breath     Past Surgical History:  Procedure Laterality Date  . CATARACT EXTRACTION W/PHACO  07/23/2011   Procedure: CATARACT EXTRACTION PHACO AND INTRAOCULAR LENS PLACEMENT (IOC);  Surgeon: Williams Che, MD;  Location: [facility];  Service: Ophthalmology;  Laterality: Right;  CDE:9.96  . CHOLECYSTECTOMY N/A 02/14/2015   Procedure: LAPAROSCOPIC CHOLECYSTECTOMY;  Surgeon: Aviva Signs, MD;  Location: [facility];  Service: General;  Laterality: N/A;  . MASTECTOMY     bilateral mastectomy-right breast cancer-left taken by choice  . SEPTOPLASTY      Current Outpatient Prescriptions on File Prior to Visit  Medication Sig Dispense Refill  . acetaminophen (TYLENOL) 650 MG CR tablet Take 650 mg by mouth at bedtime.    Marland Kitchen albuterol (PROVENTIL HFA;VENTOLIN HFA) 108 (90 BASE)  MCG/ACT inhaler Inhale 1-2 puffs into the lungs every 6 (six) hours as needed for wheezing or shortness of breath.    . ALPRAZolam (XANAX) 0.5 MG tablet Take 0.5 mg by mouth 4 (four) times daily as needed. FOR SHORTNESS OF BREATH    . BREO ELLIPTA 100-25 MCG/INH AEPB Inhale 2 puffs into the lungs 2 (two) times daily.    . fluticasone (FLONASE) 50 MCG/ACT nasal spray Place 2 sprays into both nostrils daily. Allergies 1 g 2  . Glycerin-Hypromellose-PEG 400 (VISINE TEARS OP) Apply 1 drop to eye daily as needed. Watery Eyes    . losartan-hydrochlorothiazide (HYZAAR) 100-25 MG per tablet Take 1 tablet by mouth daily.     No current facility-administered medications on file prior to visit.     Social History   Social History  . Marital status: Married    Spouse name: N/A  . Number of children: N/A  . Years of education: N/A   Occupational History  . Not on file.   Social History Main Topics  . Smoking status: Former Smoker    Packs/day: 1.00    Years: 28.00    Types: Cigarettes    Quit date: 07/17/1983  . Smokeless tobacco: Never Used  . Alcohol use No  . Drug use: No  . Sexual activity: No   Other Topics Concern  . Not on file  Social History Narrative  . No narrative on file    Family History  Problem Relation Age of Onset  . Cancer Mother   . Aneurysm Father   . Cancer Sister   . Cancer Sister   . Anesthesia problems Neg Hx   . Hypotension Neg Hx   . Malignant hyperthermia Neg Hx   . Pseudochol deficiency Neg Hx     BP (!) 189/96   Pulse (!) 101   Temp 97.7 F (36.5 C)   Ht 5\' 3"  (1.6 m)   Wt 152 lb (68.9 kg)   BMI 26.93 kg/m      Objective:   Physical Exam  Constitutional: She is oriented to person, place, and time. She appears well-developed and well-nourished.  HENT:  Head: Normocephalic and atraumatic.  Eyes: Conjunctivae and EOM are normal. Pupils are equal, round, and reactive to light.  Neck: Normal range of motion. Neck supple.  Cardiovascular:  Normal rate, regular rhythm and intact distal pulses.   Pulmonary/Chest: Effort normal.  Abdominal: Soft.  Musculoskeletal: She exhibits tenderness (No lower back pain, full ROM, can touch toes, NV intact, Full ROM of the right hip and knee and ankle.  No redness and no edema.  Left side negative.).  Neurological: She is alert and oriented to person, place, and time. She displays normal reflexes. No cranial nerve deficit. She exhibits normal muscle tone. Coordination normal.  Skin: Skin is warm and dry.  Psychiatric: She has a normal mood and affect. Her behavior is normal. Judgment and thought content normal.   X-rays were done of the lumbar spine, reported separately.       Assessment & Plan:   Encounter Diagnoses  Name Primary?  . Acute bilateral low back pain with right-sided sciatica Yes  . Acute midline low back pain with right-sided sciatica    I have recommended Aleve one bid pc.  Samples were given.  Stop if any GI or other side effects.  I will re-evaluate her in two weeks.  She may need MRI.  Call if any problem.  Precautions discussed.  Electronically Signed Sanjuana Kava, MD 1/30/201811:07 AM

## 2016-05-08 ENCOUNTER — Ambulatory Visit: Payer: Medicare Other | Admitting: Orthopaedic Surgery

## 2016-05-22 DIAGNOSIS — L409 Psoriasis, unspecified: Secondary | ICD-10-CM | POA: Diagnosis not present

## 2016-05-22 DIAGNOSIS — L570 Actinic keratosis: Secondary | ICD-10-CM | POA: Diagnosis not present

## 2016-05-22 DIAGNOSIS — L650 Telogen effluvium: Secondary | ICD-10-CM | POA: Diagnosis not present

## 2016-05-25 DIAGNOSIS — J440 Chronic obstructive pulmonary disease with acute lower respiratory infection: Secondary | ICD-10-CM | POA: Diagnosis not present

## 2016-05-25 DIAGNOSIS — J0141 Acute recurrent pansinusitis: Secondary | ICD-10-CM | POA: Diagnosis not present

## 2016-05-25 DIAGNOSIS — J209 Acute bronchitis, unspecified: Secondary | ICD-10-CM | POA: Diagnosis not present

## 2016-06-13 DIAGNOSIS — M1611 Unilateral primary osteoarthritis, right hip: Secondary | ICD-10-CM | POA: Diagnosis not present

## 2016-06-13 DIAGNOSIS — M25551 Pain in right hip: Secondary | ICD-10-CM | POA: Diagnosis not present

## 2016-07-03 ENCOUNTER — Ambulatory Visit: Payer: Self-pay | Admitting: Allergy & Immunology

## 2016-08-14 DIAGNOSIS — J45901 Unspecified asthma with (acute) exacerbation: Secondary | ICD-10-CM | POA: Diagnosis not present

## 2016-08-14 DIAGNOSIS — Z1389 Encounter for screening for other disorder: Secondary | ICD-10-CM | POA: Diagnosis not present

## 2016-08-14 DIAGNOSIS — I10 Essential (primary) hypertension: Secondary | ICD-10-CM | POA: Diagnosis not present

## 2016-09-23 ENCOUNTER — Inpatient Hospital Stay (HOSPITAL_COMMUNITY)
Admission: EM | Admit: 2016-09-23 | Discharge: 2016-09-26 | DRG: 291 | Disposition: A | Discharge status: Home / Self Care | Payer: Medicare Other | Attending: Internal Medicine | Admitting: Internal Medicine

## 2016-09-23 ENCOUNTER — Encounter (HOSPITAL_COMMUNITY): Payer: Self-pay | Admitting: *Deleted

## 2016-09-23 ENCOUNTER — Emergency Department (HOSPITAL_COMMUNITY): Payer: Medicare Other

## 2016-09-23 DIAGNOSIS — J45909 Unspecified asthma, uncomplicated: Secondary | ICD-10-CM | POA: Diagnosis present

## 2016-09-23 DIAGNOSIS — I509 Heart failure, unspecified: Secondary | ICD-10-CM | POA: Diagnosis not present

## 2016-09-23 DIAGNOSIS — Z66 Do not resuscitate: Secondary | ICD-10-CM | POA: Diagnosis not present

## 2016-09-23 DIAGNOSIS — I5031 Acute diastolic (congestive) heart failure: Secondary | ICD-10-CM | POA: Diagnosis not present

## 2016-09-23 DIAGNOSIS — J4520 Mild intermittent asthma, uncomplicated: Secondary | ICD-10-CM | POA: Diagnosis not present

## 2016-09-23 DIAGNOSIS — I4891 Unspecified atrial fibrillation: Secondary | ICD-10-CM | POA: Diagnosis not present

## 2016-09-23 DIAGNOSIS — R531 Weakness: Secondary | ICD-10-CM | POA: Diagnosis not present

## 2016-09-23 DIAGNOSIS — I517 Cardiomegaly: Secondary | ICD-10-CM | POA: Diagnosis not present

## 2016-09-23 DIAGNOSIS — N182 Chronic kidney disease, stage 2 (mild): Secondary | ICD-10-CM | POA: Diagnosis present

## 2016-09-23 DIAGNOSIS — K219 Gastro-esophageal reflux disease without esophagitis: Secondary | ICD-10-CM | POA: Diagnosis not present

## 2016-09-23 DIAGNOSIS — D649 Anemia, unspecified: Secondary | ICD-10-CM | POA: Diagnosis not present

## 2016-09-23 DIAGNOSIS — Z853 Personal history of malignant neoplasm of breast: Secondary | ICD-10-CM

## 2016-09-23 DIAGNOSIS — I130 Hypertensive heart and chronic kidney disease with heart failure and stage 1 through stage 4 chronic kidney disease, or unspecified chronic kidney disease: Secondary | ICD-10-CM | POA: Diagnosis not present

## 2016-09-23 DIAGNOSIS — R739 Hyperglycemia, unspecified: Secondary | ICD-10-CM | POA: Diagnosis present

## 2016-09-23 DIAGNOSIS — E782 Mixed hyperlipidemia: Secondary | ICD-10-CM | POA: Diagnosis not present

## 2016-09-23 DIAGNOSIS — I10 Essential (primary) hypertension: Secondary | ICD-10-CM | POA: Diagnosis present

## 2016-09-23 DIAGNOSIS — Z7901 Long term (current) use of anticoagulants: Secondary | ICD-10-CM

## 2016-09-23 DIAGNOSIS — Z91018 Allergy to other foods: Secondary | ICD-10-CM | POA: Diagnosis not present

## 2016-09-23 DIAGNOSIS — Z79899 Other long term (current) drug therapy: Secondary | ICD-10-CM | POA: Diagnosis not present

## 2016-09-23 DIAGNOSIS — Z882 Allergy status to sulfonamides status: Secondary | ICD-10-CM | POA: Diagnosis not present

## 2016-09-23 DIAGNOSIS — E876 Hypokalemia: Secondary | ICD-10-CM | POA: Diagnosis not present

## 2016-09-23 DIAGNOSIS — N183 Chronic kidney disease, stage 3 (moderate): Secondary | ICD-10-CM | POA: Diagnosis not present

## 2016-09-23 DIAGNOSIS — J90 Pleural effusion, not elsewhere classified: Secondary | ICD-10-CM | POA: Diagnosis not present

## 2016-09-23 HISTORY — DX: Dyspnea, unspecified: R06.00

## 2016-09-23 LAB — MRSA PCR SCREENING: MRSA by PCR: NEGATIVE

## 2016-09-23 LAB — CBC WITH DIFFERENTIAL/PLATELET
Basophils Absolute: 0 10*3/uL (ref 0.0–0.1)
Basophils Relative: 0 %
EOS ABS: 0.5 10*3/uL (ref 0.0–0.7)
EOS PCT: 6 %
HCT: 37.9 % (ref 36.0–46.0)
Hemoglobin: 12.4 g/dL (ref 12.0–15.0)
Lymphocytes Relative: 21 %
Lymphs Abs: 2 10*3/uL (ref 0.7–4.0)
MCH: 31 pg (ref 26.0–34.0)
MCHC: 32.7 g/dL (ref 30.0–36.0)
MCV: 94.8 fL (ref 78.0–100.0)
Monocytes Absolute: 0.6 10*3/uL (ref 0.1–1.0)
Monocytes Relative: 6 %
Neutro Abs: 6.2 10*3/uL (ref 1.7–7.7)
Neutrophils Relative %: 67 %
PLATELETS: 341 10*3/uL (ref 150–400)
RBC: 4 MIL/uL (ref 3.87–5.11)
RDW: 13 % (ref 11.5–15.5)
WBC: 9.3 10*3/uL (ref 4.0–10.5)

## 2016-09-23 LAB — GLUCOSE, CAPILLARY: Glucose-Capillary: 120 mg/dL — ABNORMAL HIGH (ref 65–99)

## 2016-09-23 LAB — BASIC METABOLIC PANEL
ANION GAP: 11 (ref 5–15)
BUN: 10 mg/dL (ref 6–20)
CALCIUM: 9.8 mg/dL (ref 8.9–10.3)
CO2: 24 mmol/L (ref 22–32)
Chloride: 108 mmol/L (ref 101–111)
Creatinine, Ser: 1.21 mg/dL — ABNORMAL HIGH (ref 0.44–1.00)
GFR calc Af Amer: 49 mL/min — ABNORMAL LOW (ref >60)
GFR, EST NON AFRICAN AMERICAN: 42 mL/min — AB (ref >60)
GLUCOSE: 128 mg/dL — AB (ref 65–99)
Potassium: 3.9 mmol/L (ref 3.5–5.1)
Sodium: 143 mmol/L (ref 135–145)

## 2016-09-23 LAB — BRAIN NATRIURETIC PEPTIDE: B Natriuretic Peptide: 709 pg/mL — ABNORMAL HIGH (ref 0.0–100.0)

## 2016-09-23 LAB — HEPATIC FUNCTION PANEL
ALT: 28 U/L (ref 14–54)
AST: 26 U/L (ref 15–41)
Albumin: 4.1 g/dL (ref 3.5–5.0)
Alkaline Phosphatase: 92 U/L (ref 38–126)
BILIRUBIN DIRECT: 0.1 mg/dL (ref 0.1–0.5)
BILIRUBIN INDIRECT: 1 mg/dL — AB (ref 0.3–0.9)
Total Bilirubin: 1.1 mg/dL (ref 0.3–1.2)
Total Protein: 7.9 g/dL (ref 6.5–8.1)

## 2016-09-23 LAB — APTT: aPTT: 39 seconds — ABNORMAL HIGH (ref 24–36)

## 2016-09-23 LAB — TROPONIN I: Troponin I: <0.03 ng/mL (ref <0.03)

## 2016-09-23 LAB — TSH: TSH: 0.975 u[IU]/mL (ref 0.350–4.500)

## 2016-09-23 LAB — PROTIME-INR
INR: 1.1
Prothrombin Time: 14.3 seconds (ref 11.4–15.2)

## 2016-09-23 MED ORDER — DILTIAZEM HCL 100 MG IV SOLR
5.0000 mg/h | INTRAVENOUS | Status: DC
  Administered 2016-09-23: 15 mg/h via INTRAVENOUS
  Administered 2016-09-24: 5 mg/h via INTRAVENOUS
  Administered 2016-09-24: 15 mg/h via INTRAVENOUS
  Filled 2016-09-23 (×2): qty 100

## 2016-09-23 MED ORDER — ASPIRIN 81 MG PO CHEW
324.0000 mg | CHEWABLE_TABLET | Freq: Once | ORAL | Status: AC
  Administered 2016-09-23: 324 mg via ORAL
  Filled 2016-09-23: qty 4

## 2016-09-23 MED ORDER — DILTIAZEM HCL 100 MG IV SOLR
5.0000 mg/h | Freq: Once | INTRAVENOUS | Status: AC
  Administered 2016-09-23: 10 mg/h via INTRAVENOUS

## 2016-09-23 MED ORDER — PANTOPRAZOLE SODIUM 40 MG PO TBEC
40.0000 mg | DELAYED_RELEASE_TABLET | Freq: Every day | ORAL | Status: DC
  Administered 2016-09-23 – 2016-09-26 (×4): 40 mg via ORAL
  Filled 2016-09-23 (×4): qty 1

## 2016-09-23 MED ORDER — SODIUM CHLORIDE 0.9 % IV SOLN: 250.0000 mL | INTRAVENOUS | Status: DC | PRN

## 2016-09-23 MED ORDER — SODIUM CHLORIDE 0.9% FLUSH
3.0000 mL | INTRAVENOUS | Status: DC | PRN
  Administered 2016-09-24: 3 mL via INTRAVENOUS
  Filled 2016-09-23: qty 3

## 2016-09-23 MED ORDER — DILTIAZEM HCL 100 MG IV SOLR
INTRAVENOUS | Status: AC
  Administered 2016-09-23: 14:00:00
  Filled 2016-09-23: qty 100

## 2016-09-23 MED ORDER — FLUTICASONE FUROATE-VILANTEROL 100-25 MCG/INH IN AEPB
1.0000 | INHALATION_SPRAY | Freq: Every day | RESPIRATORY_TRACT | Status: DC
  Administered 2016-09-24 – 2016-09-26 (×3): 1 via RESPIRATORY_TRACT
  Filled 2016-09-23: qty 28

## 2016-09-23 MED ORDER — ACETAMINOPHEN 325 MG PO TABS: 650.0000 mg | ORAL_TABLET | ORAL | Status: DC | PRN

## 2016-09-23 MED ORDER — ENOXAPARIN SODIUM 100 MG/ML ~~LOC~~ SOLN
100.0000 mg | SUBCUTANEOUS | Status: DC
  Administered 2016-09-23 – 2016-09-25 (×3): 100 mg via SUBCUTANEOUS
  Filled 2016-09-23 (×3): qty 1

## 2016-09-23 MED ORDER — SODIUM CHLORIDE 0.9% FLUSH
3.0000 mL | Freq: Two times a day (BID) | INTRAVENOUS | Status: DC
  Administered 2016-09-23 – 2016-09-25 (×5): 3 mL via INTRAVENOUS

## 2016-09-23 MED ORDER — ONDANSETRON HCL 4 MG/2ML IJ SOLN: 4.0000 mg | Freq: Four times a day (QID) | INTRAMUSCULAR | Status: DC | PRN

## 2016-09-23 MED ORDER — DILTIAZEM HCL 25 MG/5ML IV SOLN
10.0000 mg | Freq: Once | INTRAVENOUS | Status: AC
  Administered 2016-09-23: 10 mg via INTRAVENOUS
  Filled 2016-09-23: qty 5

## 2016-09-23 MED ORDER — WARFARIN SODIUM 5 MG PO TABS
5.0000 mg | ORAL_TABLET | Freq: Once | ORAL | Status: AC
  Administered 2016-09-23: 5 mg via ORAL
  Filled 2016-09-23: qty 1

## 2016-09-23 MED ORDER — FLUTICASONE PROPIONATE 50 MCG/ACT NA SUSP
2.0000 | Freq: Every day | NASAL | Status: DC
  Filled 2016-09-23 (×2): qty 16

## 2016-09-23 MED ORDER — FUROSEMIDE 10 MG/ML IJ SOLN
20.0000 mg | Freq: Once | INTRAMUSCULAR | Status: AC
  Administered 2016-09-23: 20 mg via INTRAVENOUS
  Filled 2016-09-23: qty 2

## 2016-09-23 MED ORDER — IPRATROPIUM-ALBUTEROL 0.5-2.5 (3) MG/3ML IN SOLN
3.0000 mL | RESPIRATORY_TRACT | Status: DC | PRN
  Administered 2016-09-23: 3 mL via RESPIRATORY_TRACT
  Filled 2016-09-23: qty 3

## 2016-09-23 MED ORDER — WARFARIN - PHARMACIST DOSING INPATIENT
Freq: Every day | Status: DC
  Administered 2016-09-24 – 2016-09-26 (×3)

## 2016-09-23 MED ORDER — ASPIRIN 81 MG PO CHEW
81.0000 mg | CHEWABLE_TABLET | Freq: Every day | ORAL | Status: DC
  Administered 2016-09-24: 81 mg via ORAL
  Filled 2016-09-23: qty 1

## 2016-09-23 MED ORDER — ALPRAZOLAM 0.5 MG PO TABS
0.5000 mg | ORAL_TABLET | Freq: Four times a day (QID) | ORAL | Status: DC | PRN
  Administered 2016-09-23 – 2016-09-25 (×4): 0.5 mg via ORAL
  Filled 2016-09-23 (×4): qty 1

## 2016-09-23 MED ORDER — DILTIAZEM HCL 100 MG IV SOLR
10.0000 mg/h | Freq: Once | INTRAVENOUS | Status: AC
  Administered 2016-09-23: 10 mg/h via INTRAVENOUS

## 2016-09-23 NOTE — H&P (Signed)
History and Physical    Bailey Bishop WOE:[id number] DOB: 21-Apr-1940 DOA: 09/23/2016  PCP: Hilma Favors Consultants:  None Patient coming from: Home - lives with husband; NOK: husband, [name] [phone]  Chief Complaint: SOB  HPI: Bailey Bishop is a 76 y.o. female with medical history significant of HTN, GERD, remote breast cancer, and arthritis presenting with SOB.  She reports that URI-type symptoms started coming on last weekend.  URI intermittently x 15 years.  Starts in her head and goes into her chest.  This time, she has not noticed any wheezing.  She started getting very SOB Wednesday night.  She mowed their 2-acre property Wednesday.  Felt very sick, hurting all over Thursday.  She really started losing her breath that day.  Friday HR 113, BP 130/78.  Eyes watering.  She was seen at [facility] today, temp 99, HR 185.  She has been feeling hot in her head but not in her body.  No cough.  +hoarseness, hard to get the congestion up.  Denies chest pain, has not noticed that her heart is racing but she has felt very nervous.  No h/o similar.   ED Course: New-onset afib with RVR; given Cardizem bolus and then started on infusion  Review of Systems: As per HPI; otherwise review of systems reviewed and negative.   Ambulatory Status:  Ambulates without assistance  Past Medical History:  Diagnosis Date  . Anxiety   . Arthritis   . Asthma   . Cancer [name]) 2001   right breast  . Dyspnea   . GERD (gastroesophageal reflux disease)   . Hypertension     Past Surgical History:  Procedure Laterality Date  . CATARACT EXTRACTION W/PHACO  07/23/2011   Procedure: CATARACT EXTRACTION PHACO AND INTRAOCULAR LENS PLACEMENT (IOC);  Surgeon: Williams Che, MD;  Location: [facility];  Service: Ophthalmology;  Laterality: Right;  CDE:9.96  . CHOLECYSTECTOMY N/A 02/14/2015   Procedure: LAPAROSCOPIC CHOLECYSTECTOMY;  Surgeon: Aviva Signs, MD;  Location: [facility];  Service: General;  Laterality: N/A;  .  MASTECTOMY     bilateral mastectomy-right breast cancer-left taken by choice  . SEPTOPLASTY  1995    Social History   Social History  . Marital status: Married    Spouse name: N/A  . Number of children: N/A  . Years of education: N/A   Occupational History  . Not on file.   Social History Main Topics  . Smoking status: Former Smoker    Packs/day: 1.00    Years: 28.00    Types: Cigarettes    Quit date: 07/17/1983  . Smokeless tobacco: Never Used  . Alcohol use No  . Drug use: No  . Sexual activity: No   Other Topics Concern  . Not on file   Social History Narrative  . No narrative on file    Allergies  Allergen Reactions  . Sulfa Antibiotics Nausea And Vomiting  . Chocolate Rash    Family History  Problem Relation Age of Onset  . Cancer Mother   . Aneurysm Father   . Cancer Sister   . Cancer Sister   . Anesthesia problems Neg Hx   . Hypotension Neg Hx   . Malignant hyperthermia Neg Hx   . Pseudochol deficiency Neg Hx     Prior to Admission medications   Medication Sig Start Date End Date Taking? Authorizing Provider  acetaminophen (TYLENOL) 650 MG CR tablet Take 650 mg by mouth at bedtime.   Yes [provider]  naproxen sodium (ANAPROX) 220 MG tablet Take 440 mg by mouth 2 (two) times daily with a meal.   Yes [provider]  albuterol (PROVENTIL HFA;VENTOLIN HFA) 108 (90 BASE) MCG/ACT inhaler Inhale 1-2 puffs into the lungs every 6 (six) hours as needed for wheezing or shortness of breath.    [provider]  ALPRAZolam Duanne Moron) 0.5 MG tablet Take 0.5 mg by mouth 4 (four) times daily as needed. FOR SHORTNESS OF BREATH 06/18/14   [provider]  BREO ELLIPTA 100-25 MCG/INH AEPB Inhale 2 puffs into the lungs 2 (two) times daily. 01/31/15   [provider]  fluticasone (FLONASE) 50 MCG/ACT nasal spray Place 2 sprays into both nostrils daily. Allergies 07/09/14   Kelvin Cellar, MD  Glycerin-Hypromellose-PEG 400  (VISINE TEARS OP) Apply 1 drop to eye daily as needed. Watery Eyes    [provider]  losartan (COZAAR) 100 MG tablet Take 1 tablet by mouth daily. 08/14/16   [provider]  losartan-hydrochlorothiazide (HYZAAR) 100-25 MG per tablet Take 1 tablet by mouth daily.    [provider]  omeprazole (PRILOSEC) 20 MG capsule  02/14/16   [provider]  traMADol-acetaminophen (ULTRACET) 37.5-325 MG tablet Take 1 tablet by mouth every 4 (four) hours. 07/28/16   [provider]    Physical Exam: Vitals:   09/23/16 1630 09/23/16 1729 09/23/16 1800 09/23/16 1900  BP: (!) 145/85 (!) 165/90 (!) 161/100 (!) 142/83  Pulse: (!) 129 (!) 108 (!) 112 (!) 124  Resp: (!) 26 (!) 25 (!) 26 (!) 21  Temp:  99.2 F (37.3 C)    TempSrc:  Oral    SpO2: 92% 94% 97% 92%  Weight:  68.4 kg (150 lb 12.7 oz)    Height:         General:  Appears calm and comfortable and is NAD Eyes:  PERRL, EOMI, normal lids, iris ENT:  grossly normal hearing, lips & tongue, mmm Neck:  no LAD, masses or thyromegaly Cardiovascular:  Irregularly irregular, rate about 120-130, no m/r/g. No LE edema.  Respiratory:  CTA bilaterally, no w/r/r. Normal respiratory effort. Abdomen:  soft, ntnd, NABS Skin:  no rash or induration seen on limited exam Musculoskeletal:  grossly normal tone BUE/BLE, good ROM, no bony abnormality Psychiatric:  grossly normal mood and affect, speech fluent and appropriate, AOx3 Neurologic:  CN 2-12 grossly intact, moves all extremities in coordinated fashion, sensation intact  Labs on Admission: I have personally reviewed following labs and imaging studies  CBC:  Recent Labs Lab 09/23/16 1344  WBC 9.3  NEUTROABS 6.2  HGB 12.4  HCT 37.9  MCV 94.8  PLT 630   Basic Metabolic Panel:  Recent Labs Lab 09/23/16 1344  NA 143  K 3.9  CL 108  CO2 24  GLUCOSE 128*  BUN 10  CREATININE 1.21*  CALCIUM 9.8   GFR: Estimated Creatinine Clearance: 37 mL/min (A)  (by C-G formula based on SCr of 1.21 mg/dL (H)). Liver Function Tests:  Recent Labs Lab 09/23/16 1344  AST 26  ALT 28  ALKPHOS 92  BILITOT 1.1  PROT 7.9  ALBUMIN 4.1   No results for input(s): LIPASE, AMYLASE in the last 168 hours. No results for input(s): AMMONIA in the last 168 hours. Coagulation Profile:  Recent Labs Lab 09/23/16 1741  INR 1.10   Cardiac Enzymes:  Recent Labs Lab 09/23/16 1344  TROPONINI <0.03   BNP (last 3 results) No results for input(s): PROBNP in the last 8760 hours.  HbA1C: No results for input(s): HGBA1C in the last 72 hours. CBG: No results for input(s): GLUCAP in the last 168 hours. Lipid Profile: No results for input(s): CHOL, HDL, LDLCALC, TRIG, CHOLHDL, LDLDIRECT in the last 72 hours. Thyroid Function Tests:  Recent Labs  09/23/16 1741  TSH 0.975   Anemia Panel: No results for input(s): VITAMINB12, FOLATE, FERRITIN, TIBC, IRON, RETICCTPCT in the last 72 hours. Urine analysis:    Component Value Date/Time   COLORURINE YELLOW 02/09/2015 1540   APPEARANCEUR CLEAR 02/09/2015 1540   LABSPEC 1.010 02/09/2015 1540   PHURINE 7.5 02/09/2015 1540   GLUCOSEU NEGATIVE 02/09/2015 1540   HGBUR NEGATIVE 02/09/2015 1540   BILIRUBINUR NEGATIVE 02/09/2015 1540   KETONESUR NEGATIVE 02/09/2015 1540   PROTEINUR NEGATIVE 02/09/2015 1540   NITRITE NEGATIVE 02/09/2015 1540   LEUKOCYTESUR TRACE (A) 02/09/2015 1540    Creatinine Clearance: Estimated Creatinine Clearance: 37 mL/min (A) (by C-G formula based on SCr of 1.21 mg/dL (H)).  Sepsis Labs: @LABRCNTIP (procalcitonin:4,lacticidven:4) )No results found for this or any previous visit (from the past 240 hour(s)).   Radiological Exams on Admission: Dg Chest Portable 1 View  Result Date: 09/23/2016 CLINICAL DATA:  Weakness EXAM: PORTABLE CHEST 1 VIEW COMPARISON:  02/09/2016 FINDINGS: Cardiomegaly with vascular congestion and interstitial prominence, likely interstitial edema. Small right  effusion. No acute bony abnormality. IMPRESSION: Mild edema/CHF.  Small right effusion. Electronically Signed   By: Rolm Baptise M.D.   On: 09/23/2016 14:26    EKG: Independently reviewed.  Afib with rate 184 (repeat 110); nonspecific ST changes with no evidence of acute ischemia  Assessment/Plan Principal Problem:   Atrial fibrillation with RVR (HCC) Active Problems:   Essential hypertension   CKD (chronic kidney disease), stage III   Hyperglycemia   Afib with RVR -Patient presenting with new-onset afib.  -Etiology is unknown.  It may be related to HTN.  She does not have thyroid dysfunction (normal TSH today), known ischemic heart disease (no chest pain, negative troponin), known CHF (will check Echo once rate controlled), denies drug abuse, no hypoxia.  -Since the afib onset is unknown, will focus on rate control and searching for the underlying cause at this time. -Will admit to SDU for Diltiazem drip as per protocol with plan to transition to PO Diltiazem once heart rate is controlled.   -Likely no indication for electrical cardioversion at this time - she is largely asymptomatic (SOB is her only complaint despite HR 185) and has been in afib for an unknown period of time. -Will cycle cardiac enzymes -Will provide ASA 324 mg PO x 1 (if not already given) and daily ASA 81 mg PO daily.   -Will request Echocardiogram for further evaluation -Will also risk stratify with FLP and HgbA1c.   -Will repeat EKG in AM and plan to consult cardiology at that time. -CHA2DS2-VASc Score is >2 (currently appears to be 4) and so patient will need oral anticoagulation.  -Patient requests to start Coumadin due to costs (and success in her brother-in-law); will give treatment-dose Lovenox for now and also request pharmacy to dose Coumadin. -BNP 709, no prior available; suspect that her "URI" symptoms may be related to very mild volume overload.  Will give 1-time dose of Lasix 20 mg to see if this improves  her "chest congestion".  HTN -Hold Cozaar for now due to borderline hypotension and need to obtain rate control -Suggest restarting once afib is stabilized  CKD -Creatinine 1.21/GFR 42; 8.88/28 in 00/34 -Uncertain baseline but it would  not be surprising for this to be at/near her current baseline, will follow -Resume Cozaar when appropriate  Hyperglycemia -Glucose 128, no prior A1c -Will check A1c    DVT prophylaxis: Lovenox/Coumadin Code Status:  DNR - confirmed with patient/family Family Communication: Husband present throughout evaluation  Disposition Plan:  Home once clinically improved Consults called: Cardiology, Care Management Admission status: Admit - It is my clinical opinion that admission to INPATIENT is reasonable and necessary because this patient will require at least 2 midnights in the hospital to treat this condition based on the medical complexity of the problems presented.  Given the aforementioned information, the predictability of an adverse outcome is felt to be significant.    Karmen Bongo MD [facility]  If 7PM-7AM, please contact night-coverage [email or web address] Password [email or web address]  09/23/2016, 8:03 PM

## 2016-09-23 NOTE — ED Notes (Signed)
Report given to [employer].  Pt going to room 7 ICU.

## 2016-09-23 NOTE — ED Notes (Signed)
Attempted to call report

## 2016-09-23 NOTE — ED Provider Notes (Signed)
[facility] DEPT Provider Note   CSN: [id number] Arrival date & time: 09/23/16  1340     History   Chief Complaint Chief Complaint  Patient presents with  . Tachycardia    HPI Bailey Bishop is a 76 y.o. female.  Patient complains of some shortness of breath and irregular heartbeat for a few days. Patient states that he got worse over the last couple days   The history is provided by the patient. No language interpreter was used.  Palpitations   This is a new problem. The current episode started more than 2 days ago. The problem occurs constantly. The problem has not changed since onset.The problem is associated with anxiety. Associated symptoms include shortness of breath. Pertinent negatives include no chest pain, no near-syncope, no abdominal pain, no headaches, no back pain and no cough.    Past Medical History:  Diagnosis Date  . Anxiety   . Arthritis   . Asthma   . Cancer ([place])    right breast  . Epigastric abdominal pain   . GERD (gastroesophageal reflux disease)   . H/O hiatal hernia   . Hypertension   . Shortness of breath     Patient Active Problem List   Diagnosis Date Noted  . Hypertension   . Anxiety   . GERD (gastroesophageal reflux disease)   . Shortness of breath   . Essential hypertension   . Status asthmaticus 07/07/2014    Past Surgical History:  Procedure Laterality Date  . CATARACT EXTRACTION W/PHACO  07/23/2011   Procedure: CATARACT EXTRACTION PHACO AND INTRAOCULAR LENS PLACEMENT (IOC);  Surgeon: Williams Che, MD;  Location: [facility];  Service: Ophthalmology;  Laterality: Right;  CDE:9.96  . CHOLECYSTECTOMY N/A 02/14/2015   Procedure: LAPAROSCOPIC CHOLECYSTECTOMY;  Surgeon: Aviva Signs, MD;  Location: [facility];  Service: General;  Laterality: N/A;  . MASTECTOMY     bilateral mastectomy-right breast cancer-left taken by choice  . SEPTOPLASTY      OB History    No data available       Home Medications    Prior to Admission  medications   Medication Sig Start Date End Date Taking? Authorizing Provider  acetaminophen (TYLENOL) 650 MG CR tablet Take 650 mg by mouth at bedtime.   Yes [provider]  naproxen sodium (ANAPROX) 220 MG tablet Take 440 mg by mouth 2 (two) times daily with a meal.   Yes [provider]  albuterol (PROVENTIL HFA;VENTOLIN HFA) 108 (90 BASE) MCG/ACT inhaler Inhale 1-2 puffs into the lungs every 6 (six) hours as needed for wheezing or shortness of breath.    [provider]  ALPRAZolam Duanne Moron) 0.5 MG tablet Take 0.5 mg by mouth 4 (four) times daily as needed. FOR SHORTNESS OF BREATH 06/18/14   [provider]  BREO ELLIPTA 100-25 MCG/INH AEPB Inhale 2 puffs into the lungs 2 (two) times daily. 01/31/15   [provider]  fluticasone (FLONASE) 50 MCG/ACT nasal spray Place 2 sprays into both nostrils daily. Allergies 07/09/14   Kelvin Cellar, MD  Glycerin-Hypromellose-PEG 400 (VISINE TEARS OP) Apply 1 drop to eye daily as needed. Watery Eyes    [provider]  losartan (COZAAR) 100 MG tablet Take 1 tablet by mouth daily. 08/14/16   [provider]  losartan-hydrochlorothiazide (HYZAAR) 100-25 MG per tablet Take 1 tablet by mouth daily.    [provider]  omeprazole (PRILOSEC) 20 MG capsule  02/14/16   [provider]  traMADol-acetaminophen (ULTRACET) 37.5-325 MG  tablet Take 1 tablet by mouth every 4 (four) hours. 07/28/16   [provider]    Family History Family History  Problem Relation Age of Onset  . Cancer Mother   . Aneurysm Father   . Cancer Sister   . Cancer Sister   . Anesthesia problems Neg Hx   . Hypotension Neg Hx   . Malignant hyperthermia Neg Hx   . Pseudochol deficiency Neg Hx     Social History Social History  Substance Use Topics  . Smoking status: Former Smoker    Packs/day: 1.00    Years: 28.00    Types: Cigarettes    Quit date: 07/17/1983  . Smokeless tobacco: Never Used  .  Alcohol use No     Allergies   Sulfa antibiotics and Chocolate   Review of Systems Review of Systems  Constitutional: Negative for appetite change and fatigue.  HENT: Negative for congestion, ear discharge and sinus pressure.   Eyes: Negative for discharge.  Respiratory: Positive for shortness of breath. Negative for cough.   Cardiovascular: Positive for palpitations. Negative for chest pain and near-syncope.  Gastrointestinal: Negative for abdominal pain and diarrhea.  Genitourinary: Negative for frequency and hematuria.  Musculoskeletal: Negative for back pain.  Skin: Negative for rash.  Neurological: Negative for seizures and headaches.  Psychiatric/Behavioral: Negative for hallucinations.     Physical Exam Updated Vital Signs BP (!) 142/91   Pulse (!) 109   Temp 97.8 F (36.6 C) (Oral)   Resp 18   Ht 5\' 4"  (1.626 m)   Wt 66.7 kg (147 lb)   SpO2 93%   BMI 25.23 kg/m   Physical Exam  Constitutional: She is oriented to person, place, and time. She appears well-developed.  HENT:  Head: Normocephalic.  Eyes: Conjunctivae and EOM are normal. No scleral icterus.  Neck: Neck supple. No thyromegaly present.  Cardiovascular: Exam reveals no gallop and no friction rub.   No murmur heard. Rapid regular rate  Pulmonary/Chest: No stridor. She has no wheezes. She has no rales. She exhibits no tenderness.  Abdominal: She exhibits no distension. There is no tenderness. There is no rebound.  Musculoskeletal: Normal range of motion. She exhibits no edema.  Lymphadenopathy:    She has no cervical adenopathy.  Neurological: She is oriented to person, place, and time. She exhibits normal muscle tone. Coordination normal.  Skin: No rash noted. No erythema.  Psychiatric: She has a normal mood and affect. Her behavior is normal.     ED Treatments / Results  Labs (all labs ordered are listed, but only abnormal results are displayed) Labs Reviewed  BASIC METABOLIC PANEL -  Abnormal; Notable for the following:       Result Value   Glucose, Bld 128 (*)    Creatinine, Ser 1.21 (*)    GFR calc non Af Amer 42 (*)    GFR calc Af Amer 49 (*)    All other components within normal limits  HEPATIC FUNCTION PANEL - Abnormal; Notable for the following:    Indirect Bilirubin 1.0 (*)    All other components within normal limits  BRAIN NATRIURETIC PEPTIDE - Abnormal; Notable for the following:    B Natriuretic Peptide 709.0 (*)    All other components within normal limits  CBC WITH DIFFERENTIAL/PLATELET  TROPONIN I    EKG  EKG Interpretation None       Radiology Dg Chest Portable 1 View  Result Date: 09/23/2016 CLINICAL DATA:  Weakness EXAM: PORTABLE CHEST 1  VIEW COMPARISON:  02/09/2016 FINDINGS: Cardiomegaly with vascular congestion and interstitial prominence, likely interstitial edema. Small right effusion. No acute bony abnormality. IMPRESSION: Mild edema/CHF.  Small right effusion. Electronically Signed   By: Rolm Baptise M.D.   On: 09/23/2016 14:26    Procedures Procedures (including critical care time)  Medications Ordered in ED Medications  diltiazem (CARDIZEM) injection 10 mg (10 mg Intravenous Given 09/23/16 1402)  diltiazem (CARDIZEM) 100 mg in dextrose 5 % 100 mL (1 mg/mL) infusion (0 mg/hr Intravenous Stopped 09/23/16 1505)  dextrose 5 % with diltiazem (CARDIZEM) ADS Med (  New Bag/Given 09/23/16 1400)  diltiazem (CARDIZEM) 100 mg in dextrose 5 % 100 mL (1 mg/mL) infusion (10 mg/hr Intravenous New Bag/Given 09/23/16 1430)  diltiazem (CARDIZEM) injection 10 mg (10 mg Intravenous Given 09/23/16 1436)     Initial Impression / Assessment and Plan / ED Course  I have reviewed the triage vital signs and the nursing notes.  Pertinent labs & imaging results that were available during my care of the patient were reviewed by me and considered in my medical decision making (see chart for details).     CRITICAL CARE Performed by: Shanigua Gibb L Total  critical care time:40 minutes Critical care time was exclusive of separately billable procedures and treating other patients. Critical care was necessary to treat or prevent imminent or life-threatening deterioration. Critical care was time spent personally by me on the following activities: development of treatment plan with patient and/or surrogate as well as nursing, discussions with consultants, evaluation of patient's response to treatment, examination of patient, obtaining history from patient or surrogate, ordering and performing treatments and interventions, ordering and review of laboratory studies, ordering and review of radiographic studies, pulse oximetry and re-evaluation of patient's condition.  Patient has new-onset atrial fibrillation with rapid rate. She will be admitted for new onset atrial fib Final Clinical Impressions(s) / ED Diagnoses   Final diagnoses:  Atrial fibrillation with RVR [facility])    New Prescriptions New Prescriptions   No medications on file     Milton Ferguson, MD 09/23/16 1538

## 2016-09-23 NOTE — ED Triage Notes (Signed)
Pt with c/o sob over a week, recent URI per pt.  Pt seen and sent from Urgent Care today for hr 185.  Pt c/o sob but denies any pain.

## 2016-09-23 NOTE — Progress Notes (Addendum)
ANTICOAGULATION CONSULT NOTE - Initial Consult  Pharmacy Consult for Coumadin and Lovenox Indication: atrial fibrillation  Allergies  Allergen Reactions  . Sulfa Antibiotics Nausea And Vomiting  . Chocolate Rash    Patient Measurements: Height: 5\' 4"  (162.6 cm) Weight: 150 lb 12.7 oz (68.4 kg) IBW/kg (Calculated) : 54.7  Vital Signs: Temp: 99.2 F (37.3 C) (07/01 1729) Temp Source: Oral (07/01 1729) BP: 142/83 (07/01 1900) Pulse Rate: 124 (07/01 1900)  Labs:  Recent Labs  09/23/16 1344 09/23/16 1741  HGB 12.4  --   HCT 37.9  --   PLT 341  --   APTT  --  39*  LABPROT  --  14.3  INR  --  1.10  CREATININE 1.21*  --   TROPONINI <0.03  --     Estimated Creatinine Clearance: 37 mL/min (A) (by C-G formula based on SCr of 1.21 mg/dL (H)).   Medical History: Past Medical History:  Diagnosis Date  . Anxiety   . Arthritis   . Asthma   . Cancer [facility]) 2001   right breast  . Dyspnea   . GERD (gastroesophageal reflux disease)   . Hypertension     Medications:  Prescriptions Prior to Admission  Medication Sig Dispense Refill Last Dose  . acetaminophen (TYLENOL) 650 MG CR tablet Take 650 mg by mouth at bedtime.   Past Week at Unknown time  . albuterol (PROVENTIL HFA;VENTOLIN HFA) 108 (90 BASE) MCG/ACT inhaler Inhale 1-2 puffs into the lungs every 6 (six) hours as needed for wheezing or shortness of breath.   Past Week at Unknown time  . ALPRAZolam (XANAX) 0.5 MG tablet Take 0.5 mg by mouth 4 (four) times daily as needed. FOR SHORTNESS OF BREATH   09/22/2016 at 2300  . BREO ELLIPTA 100-25 MCG/INH AEPB Inhale 1 puff into the lungs daily as needed.    09/23/2016 at 0700  . fluticasone (FLONASE) 50 MCG/ACT nasal spray Place 2 sprays into both nostrils daily. Allergies 1 g 2 09/22/2016 at Unknown time  . Glycerin-Hypromellose-PEG 400 (VISINE TEARS OP) Apply 1 drop to eye daily as needed. Watery Eyes   Past Month at Unknown time  . ipratropium-albuterol (DUONEB) 0.5-2.5 (3)  MG/3ML SOLN Take 3 mLs by nebulization every 4 (four) hours as needed.   09/23/2016 at 0900  . losartan (COZAAR) 100 MG tablet Take 1 tablet by mouth daily.   09/23/2016 at 0700  . naproxen sodium (ANAPROX) 220 MG tablet Take 440 mg by mouth 2 (two) times daily with a meal.   09/22/2016 at 1300  . omeprazole (PRILOSEC) 20 MG capsule Take 20 mg by mouth daily as needed.    09/22/2016 at 1300    Assessment: 76 yo female presents to ED with tachycardia. She complains of SOB and irregular heartbeat for a few days, which has worsened over the last couple of days.  Patient with new onset atrial fib. Pharmacy asked to start coumadin and bridge with lovenox.   Goal of Therapy:  INR 2-3 Monitor platelets by anticoagulation protocol: Yes   Plan:  Lovenox 1.5mg /kg (100mg ) sq q24h Coumadin 5mg  po x 1 Daily PT-INR Monitor for S/S of bleeding Coumadin education  Isac Sarna, BS Vena [place], BCPS Clinical Pharmacist Pager [phone] 09/23/2016,7:45 PM

## 2016-09-23 NOTE — ED Notes (Signed)
Cardizem titrated to 15 mg/hr blood pressure 136/82 heart rate 125.

## 2016-09-24 ENCOUNTER — Inpatient Hospital Stay (HOSPITAL_COMMUNITY): Payer: Medicare Other

## 2016-09-24 DIAGNOSIS — R739 Hyperglycemia, unspecified: Secondary | ICD-10-CM

## 2016-09-24 DIAGNOSIS — J4520 Mild intermittent asthma, uncomplicated: Secondary | ICD-10-CM

## 2016-09-24 DIAGNOSIS — J45909 Unspecified asthma, uncomplicated: Secondary | ICD-10-CM | POA: Diagnosis present

## 2016-09-24 DIAGNOSIS — E876 Hypokalemia: Secondary | ICD-10-CM | POA: Diagnosis not present

## 2016-09-24 DIAGNOSIS — I509 Heart failure, unspecified: Secondary | ICD-10-CM

## 2016-09-24 DIAGNOSIS — I4891 Unspecified atrial fibrillation: Secondary | ICD-10-CM

## 2016-09-24 DIAGNOSIS — E782 Mixed hyperlipidemia: Secondary | ICD-10-CM | POA: Diagnosis not present

## 2016-09-24 LAB — CBC
HCT: 34.7 % — ABNORMAL LOW (ref 36.0–46.0)
Hemoglobin: 11.4 g/dL — ABNORMAL LOW (ref 12.0–15.0)
MCH: 31.2 pg (ref 26.0–34.0)
MCHC: 32.9 g/dL (ref 30.0–36.0)
MCV: 95.1 fL (ref 78.0–100.0)
PLATELETS: 283 10*3/uL (ref 150–400)
RBC: 3.65 MIL/uL — ABNORMAL LOW (ref 3.87–5.11)
RDW: 13 % (ref 11.5–15.5)
WBC: 9.8 10*3/uL (ref 4.0–10.5)

## 2016-09-24 LAB — LIPID PANEL
CHOLESTEROL: 109 mg/dL (ref 0–200)
HDL: 34 mg/dL — ABNORMAL LOW (ref >40)
LDL Cholesterol: 62 mg/dL (ref 0–99)
TRIGLYCERIDES: 66 mg/dL (ref <150)
Total CHOL/HDL Ratio: 3.2 RATIO
VLDL: 13 mg/dL (ref 0–40)

## 2016-09-24 LAB — BASIC METABOLIC PANEL
Anion gap: 10 (ref 5–15)
BUN: 8 mg/dL (ref 6–20)
CALCIUM: 8.5 mg/dL — AB (ref 8.9–10.3)
CO2: 26 mmol/L (ref 22–32)
CREATININE: 1.03 mg/dL — AB (ref 0.44–1.00)
Chloride: 102 mmol/L (ref 101–111)
GFR calc Af Amer: 59 mL/min — ABNORMAL LOW (ref >60)
GFR, EST NON AFRICAN AMERICAN: 51 mL/min — AB (ref >60)
GLUCOSE: 98 mg/dL (ref 65–99)
Potassium: 3.2 mmol/L — ABNORMAL LOW (ref 3.5–5.1)
Sodium: 138 mmol/L (ref 135–145)

## 2016-09-24 LAB — PROTIME-INR
INR: 1.23
PROTHROMBIN TIME: 15.6 s — AB (ref 11.4–15.2)

## 2016-09-24 LAB — ECHOCARDIOGRAM COMPLETE
HEIGHTINCHES: 64 in
Weight: 2412.71 oz

## 2016-09-24 LAB — TROPONIN I

## 2016-09-24 LAB — MAGNESIUM: Magnesium: 1.8 mg/dL (ref 1.7–2.4)

## 2016-09-24 MED ORDER — FUROSEMIDE 10 MG/ML IJ SOLN
20.0000 mg | Freq: Every day | INTRAMUSCULAR | Status: DC
  Administered 2016-09-24 – 2016-09-25 (×2): 20 mg via INTRAVENOUS
  Filled 2016-09-24 (×2): qty 2

## 2016-09-24 MED ORDER — POTASSIUM CHLORIDE CRYS ER 20 MEQ PO TBCR: 30.0000 meq | EXTENDED_RELEASE_TABLET | Freq: Two times a day (BID) | ORAL | Status: DC

## 2016-09-24 MED ORDER — ALBUTEROL SULFATE (2.5 MG/3ML) 0.083% IN NEBU: 2.5000 mg | INHALATION_SOLUTION | RESPIRATORY_TRACT | Status: DC | PRN

## 2016-09-24 MED ORDER — POTASSIUM CHLORIDE CRYS ER 20 MEQ PO TBCR
20.0000 meq | EXTENDED_RELEASE_TABLET | Freq: Two times a day (BID) | ORAL | Status: DC
  Administered 2016-09-24 – 2016-09-26 (×4): 20 meq via ORAL
  Filled 2016-09-24 (×4): qty 1

## 2016-09-24 MED ORDER — ORAL CARE MOUTH RINSE
15.0000 mL | Freq: Two times a day (BID) | OROMUCOSAL | Status: DC
  Administered 2016-09-25 (×2): 15 mL via OROMUCOSAL

## 2016-09-24 MED ORDER — LEVALBUTEROL HCL 0.63 MG/3ML IN NEBU
0.6300 mg | INHALATION_SOLUTION | Freq: Three times a day (TID) | RESPIRATORY_TRACT | Status: DC
  Administered 2016-09-24 – 2016-09-26 (×7): 0.63 mg via RESPIRATORY_TRACT
  Filled 2016-09-24 (×8): qty 3

## 2016-09-24 MED ORDER — WARFARIN SODIUM 5 MG PO TABS
5.0000 mg | ORAL_TABLET | Freq: Once | ORAL | Status: AC
  Administered 2016-09-24: 5 mg via ORAL
  Filled 2016-09-24: qty 1

## 2016-09-24 MED ORDER — WARFARIN VIDEO: Freq: Once | Status: DC

## 2016-09-24 MED ORDER — POTASSIUM CHLORIDE CRYS ER 20 MEQ PO TBCR
30.0000 meq | EXTENDED_RELEASE_TABLET | ORAL | Status: AC
  Administered 2016-09-24: 09:00:00 30 meq via ORAL
  Filled 2016-09-24: qty 1

## 2016-09-24 MED ORDER — DILTIAZEM HCL 30 MG PO TABS
30.0000 mg | ORAL_TABLET | Freq: Four times a day (QID) | ORAL | Status: DC
  Administered 2016-09-24 – 2016-09-25 (×4): 30 mg via ORAL
  Filled 2016-09-24 (×4): qty 1

## 2016-09-24 NOTE — Progress Notes (Signed)
ANTICOAGULATION CONSULT NOTE - follow up  Pharmacy Consult for Coumadin and Lovenox Indication: atrial fibrillation  Allergies  Allergen Reactions  . Sulfa Antibiotics Nausea And Vomiting  . Chocolate Rash   Patient Measurements: Height: 5\' 4"  (162.6 cm) Weight: 150 lb 12.7 oz (68.4 kg) IBW/kg (Calculated) : 54.7  Vital Signs: Temp: 98.1 F (36.7 C) (07/02 0721) Temp Source: Oral (07/02 0721) BP: 115/73 (07/02 0500) Pulse Rate: 91 (07/02 0721)  Labs:  Recent Labs  09/23/16 1344 09/23/16 1741 09/23/16 2110 09/24/16 0437 09/24/16 0438  HGB 12.4  --   --  11.4*  --   HCT 37.9  --   --  34.7*  --   PLT 341  --   --  283  --   APTT  --  39*  --   --   --   LABPROT  --  14.3  --  15.6*  --   INR  --  1.10  --  1.23  --   CREATININE 1.21*  --   --   --  1.03*  TROPONINI <0.03  --  <0.03 <0.03  --    Estimated Creatinine Clearance: 43.5 mL/min (A) (by C-G formula based on SCr of 1.03 mg/dL (H)).  Medical History: Past Medical History:  Diagnosis Date  . Anxiety   . Arthritis   . Asthma   . Cancer [facility]) 2001   right breast  . Dyspnea   . GERD (gastroesophageal reflux disease)   . Hypertension    Medications:  Prescriptions Prior to Admission  Medication Sig Dispense Refill Last Dose  . acetaminophen (TYLENOL) 650 MG CR tablet Take 650 mg by mouth at bedtime.   Past Week at Unknown time  . albuterol (PROVENTIL HFA;VENTOLIN HFA) 108 (90 BASE) MCG/ACT inhaler Inhale 1-2 puffs into the lungs every 6 (six) hours as needed for wheezing or shortness of breath.   Past Week at Unknown time  . ALPRAZolam (XANAX) 0.5 MG tablet Take 0.5 mg by mouth 4 (four) times daily as needed. FOR SHORTNESS OF BREATH   09/22/2016 at 2300  . BREO ELLIPTA 100-25 MCG/INH AEPB Inhale 1 puff into the lungs daily as needed.    09/23/2016 at 0700  . fluticasone (FLONASE) 50 MCG/ACT nasal spray Place 2 sprays into both nostrils daily. Allergies 1 g 2 09/22/2016 at Unknown time  .  Glycerin-Hypromellose-PEG 400 (VISINE TEARS OP) Apply 1 drop to eye daily as needed. Watery Eyes   Past Month at Unknown time  . ipratropium-albuterol (DUONEB) 0.5-2.5 (3) MG/3ML SOLN Take 3 mLs by nebulization every 4 (four) hours as needed.   09/23/2016 at 0900  . losartan (COZAAR) 100 MG tablet Take 1 tablet by mouth daily.   09/23/2016 at 0700  . naproxen sodium (ANAPROX) 220 MG tablet Take 440 mg by mouth 2 (two) times daily with a meal.   09/22/2016 at 1300  . omeprazole (PRILOSEC) 20 MG capsule Take 20 mg by mouth daily as needed.    09/22/2016 at 1300   Assessment: 76 yo female presents to ED with tachycardia. She complains of SOB and irregular heartbeat for a few days, which has worsened over the last couple of days.  Patient with new onset atrial fib. Pharmacy asked to start coumadin and bridge with lovenox.   Goal of Therapy:  INR 2-3 Monitor platelets by anticoagulation protocol: Yes   Plan:  Continue Lovenox 1.5mg /kg (100mg ) sq q24h Coumadin 5mg  po x 1 Daily PT-INR Monitor for S/S of bleeding  Coumadin education done  Hart Robinsons, PharmD Clinical Pharmacist Pager:  [phone] 09/24/2016   09/24/2016,10:23 AM

## 2016-09-24 NOTE — Care Management Important Message (Signed)
Important Message  Patient Details  Name: Bailey Bishop MRN: [id number] Date of Birth: Feb 27, 1941   Medicare Important Message Given:  Yes    Tanayah Squitieri, Chauncey Reading, RN 09/24/2016, 1:48 PM

## 2016-09-24 NOTE — Progress Notes (Signed)
PROGRESS NOTE    Bailey Bishop  DJT:[id number] DOB: [birth year] DOA: 09/23/2016 PCP: Caren Macadam, MD    Brief Narrative:  Patient is a 76 year old woman with a history of HTN, GERD, asthma, DJD, who presented to the ED on 09/23/2016 with a chief complaint of progressive shortness of breath and URI type symptoms with chest congestion and occasional wheezing. She denied chest pain. In the ED, she was afebrile, hypertensive and tachycardic with a heart rate in the 180s on arrival. Her chest x-ray revealed mild edema/CHF and small right pleural effusion. Her BNP was 709. Her EKG revealed atrial fibrillation with an HR of 184. She was admitted for further evaluation and management.   Assessment & Plan:   Principal Problem:   Atrial fibrillation with RVR (HCC) Active Problems:   Acute CHF ([place])   Essential hypertension   CKD (chronic kidney disease), stage II   Hyperglycemia   Hypokalemia   Asthma in adult without complication   1. Newly diagnosed atrial fib with RVR. -The patient was started on a diltiazem drip. Her heart rate and blood pressure have improved on the titration of the drip. -We'll hold off on taking her off of the drip and defer until cardiology has assessed her. -Admitting physician started anticoagulation with Lovenox and Coumadin for a CHA2DS2-VASc Score is >2. -Her TSH is within normal limits. Her troponin I is negative. -2-D echocardiogram ordered. -KCl supplement ordered for hypokalemia. Magnesium level pending. -Cardiology consult is pending.  Acute CHF Patient has a recent history of chest congestion, but she denies swelling in her legs;+/-orthopnea symptoms. -Her chest x-ray revealed mild edema and small pleural effusion. This may be secondary to untreated/undiagnosed A. fib with RVR, but systolic dysfunction/diastolic dysfunction will need to be assessed. -We'll restart Lasix at 20 mg IV daily and monitor her daily weights and intake/output. -2-D  echocardiogram ordered and is pending.  Essential hypertension. Patient is treated chronically with losartan. It is currently on hold due to the diltiazem drip. -Consider restarting a smaller dose of losartan. Will hold for now as her blood pressure is soft on the diltiazem drip.  Chronic mild asthma. The patient uses Breo occasionally and as needed DuoNeb. Memory Dance was continued scheduled. We'll hold off on DuoNeb due to A. fib. -Will start Xopenex every 6 hours scheduled.  Renal insufficiency/query CKD stage II. Patient is treated chronically with an ARB. There is no known history of CKD, but her creatinine over the past 2 years range from 0.99-1.2. It was 1.21 on admission. -Losartan is on hold currently. -Lasix was given overnight and will be continued. Her creatinine has improved to 1.03. -Continue to monitor.  Mild hyperglycemia. -Hemoglobin A1c is pending. We'll continue to monitor. No need for insulin currently.     DVT prophylaxis: Lovenox and Coumadin Code Status: DO NOT RESUSCITATE Family Communication: Discussed with husband Disposition Plan: Discharge to home when clinically appropriate, likely in a couple of days.   Consultants:   Cardiology, pending  Procedures:   Echo, pending  Antimicrobials:   None    Subjective: Patient says that she is breathing better, but continues to have some chest congestion. She denies chest pain.  Objective: Vitals:   09/24/16 0300 09/24/16 0400 09/24/16 0500 09/24/16 0721  BP: (!) 109/57 106/65 115/73   Pulse: 85 77 91 91  Resp: (!) 24 19 (!) 22 (!) 22  Temp:  98.3 F (36.8 C)  98.1 F (36.7 C)  TempSrc:  Oral  Oral  SpO2:  96% 96% 97% 97%  Weight:   68.4 kg (150 lb 12.7 oz)   Height:        Intake/Output Summary (Last 24 hours) at 09/24/16 0816 Last data filed at 09/24/16 0500  Gross per 24 hour  Intake           366.25 ml  Output             1300 ml  Net          -933.75 ml   Filed Weights   09/23/16 1344  09/23/16 1729 09/24/16 0500  Weight: 66.7 kg (147 lb) 68.4 kg (150 lb 12.7 oz) 68.4 kg (150 lb 12.7 oz)    Examination:  General exam: Appears calm and comfortable  Respiratory system: Occasional scattered wheezes. Respiratory effort normal. Cardiovascular system: Irregular, irregular No pedal edema. Gastrointestinal system: Abdomen is nondistended, soft and nontender. No organomegaly or masses felt. Normal bowel sounds heard. Central nervous system: Alert and oriented. No focal neurological deficits. Extremities: No acute hot red joints. Skin: No rashes, lesions or ulcers Psychiatry: Judgement and insight appear normal. Mood & affect appropriate.     Data Reviewed: I have personally reviewed following labs and imaging studies  CBC:  Recent Labs Lab 09/23/16 1344 09/24/16 0437  WBC 9.3 9.8  NEUTROABS 6.2  --   HGB 12.4 11.4*  HCT 37.9 34.7*  MCV 94.8 95.1  PLT 341 283   Basic Metabolic Panel:  Recent Labs Lab 09/23/16 1344 09/24/16 0438  NA 143 138  K 3.9 3.2*  CL 108 102  CO2 24 26  GLUCOSE 128* 98  BUN 10 8  CREATININE 1.21* 1.03*  CALCIUM 9.8 8.5*  MG  --  1.8   GFR: Estimated Creatinine Clearance: 43.5 mL/min (A) (by C-G formula based on SCr of 1.03 mg/dL (H)). Liver Function Tests:  Recent Labs Lab 09/23/16 1344  AST 26  ALT 28  ALKPHOS 92  BILITOT 1.1  PROT 7.9  ALBUMIN 4.1   No results for input(s): LIPASE, AMYLASE in the last 168 hours. No results for input(s): AMMONIA in the last 168 hours. Coagulation Profile:  Recent Labs Lab 09/23/16 1741 09/24/16 0437  INR 1.10 1.23   Cardiac Enzymes:  Recent Labs Lab 09/23/16 1344 09/23/16 2110 09/24/16 0437  TROPONINI <0.03 <0.03 <0.03   BNP (last 3 results) No results for input(s): PROBNP in the last 8760 hours. HbA1C: No results for input(s): HGBA1C in the last 72 hours. CBG:  Recent Labs Lab 09/23/16 2129  GLUCAP 120*   Lipid Profile:  Recent Labs  09/24/16 0438  CHOL  109  HDL 34*  LDLCALC 62  TRIG 66  CHOLHDL 3.2   Thyroid Function Tests:  Recent Labs  09/23/16 1741  TSH 0.975   Anemia Panel: No results for input(s): VITAMINB12, FOLATE, FERRITIN, TIBC, IRON, RETICCTPCT in the last 72 hours. Sepsis Labs: No results for input(s): PROCALCITON, LATICACIDVEN in the last 168 hours.  Recent Results (from the past 240 hour(s))  MRSA PCR Screening     Status: None   Collection Time: 09/23/16  5:34 PM  Result Value Ref Range Status   MRSA by PCR NEGATIVE NEGATIVE Final    Comment:        The GeneXpert MRSA Assay (FDA approved for NASAL specimens only), is one component of a comprehensive MRSA colonization surveillance program. It is not intended to diagnose MRSA infection nor to guide or monitor treatment for MRSA infections.  Radiology Studies: Dg Chest Portable 1 View  Result Date: 09/23/2016 CLINICAL DATA:  Weakness EXAM: PORTABLE CHEST 1 VIEW COMPARISON:  02/09/2016 FINDINGS: Cardiomegaly with vascular congestion and interstitial prominence, likely interstitial edema. Small right effusion. No acute bony abnormality. IMPRESSION: Mild edema/CHF.  Small right effusion. Electronically Signed   By: Rolm Baptise M.D.   On: 09/23/2016 14:26        Scheduled Meds: . aspirin  81 mg Oral Daily  . enoxaparin (LOVENOX) injection  100 mg Subcutaneous Q24H  . fluticasone  2 spray Each Nare Daily  . fluticasone furoate-vilanterol  1 puff Inhalation Daily  . levalbuterol  0.63 mg Nebulization Q8H  . pantoprazole  40 mg Oral Daily  . potassium chloride  30 mEq Oral BID  . sodium chloride flush  3 mL Intravenous Q12H  . Warfarin - Pharmacist Dosing Inpatient   Does not apply q1800   Continuous Infusions: . sodium chloride    . diltiazem (CARDIZEM) infusion 10 mg/hr (09/24/16 0440)     LOS: 1 day    Time spent: 63 minutes    Rexene Alberts, MD [facility] Pager [phone]  If 7PM-7AM, please contact  night-coverage [email or web address] Password [email or web address] 09/24/2016, 8:16 AM

## 2016-09-24 NOTE — Progress Notes (Signed)
*  PRELIMINARY RESULTS* Echocardiogram 2D Echocardiogram has been performed.  Samuel Germany 09/24/2016, 2:14 PM

## 2016-09-24 NOTE — Consult Note (Addendum)
Cardiology Consultation:   [name]; [id number]; 1940/10/14   Admit date: 09/23/2016 Date of Consult: 09/24/2016  Primary Care Provider: Caren Macadam, MD Primary Cardiologist: Bronson Ing (new)   Patient Profile:   Bailey Bishop is a 76 y.o. female with a hx of hypertension, breast cancer, COPD, and GERD who is being seen today for the evaluation of new onset rapid atrial fibrillation at the request of Dr. Caryn Section.  History of Present Illness:   Bailey Bishop is a 76 y.o. female with a hx of hypertension, breast cancer, COPD, and GERD who is being seen today for the evaluation of new onset rapid atrial fibrillation.  She said she has a chronic upper respiratory infection. She became short of breath on Wednesday and thought it was related to that. Denies fevers and chest pain. SOB progressed so she went to Dr. Josue Hector urgent care yesterday and was told her HR was 185 bpm and was directed to the ED.  Here she was found to be in rapid atrial fibrillation. Denies a h/o MI, arrhythmia, and CVA. Quit smoking 33 yrs ago and had smoked for 25 yrs.  Chest xray showed mild edema/CHF and small right pleural effusion.  Initial ECG which I personally interpreted showed rapid atrial fibrillation, HR 184 bpm.  CBC and TSH were normal. BNP mildly elevated, 709. Troponins normal.   Anticoagulation initiated by admitting hospitalist with Lovenox and warfarin (as per patient preference).  Today HR in 90-105 bpm range on diltiazem 5 mg/hr. Says "I feel 1000% better).    Past Medical History:  Diagnosis Date  . Anxiety   . Arthritis   . Asthma   . Cancer [facility]) 2001   right breast  . Dyspnea   . GERD (gastroesophageal reflux disease)   . Hypertension     Past Surgical History:  Procedure Laterality Date  . CATARACT EXTRACTION W/PHACO  07/23/2011   Procedure: CATARACT EXTRACTION PHACO AND INTRAOCULAR LENS PLACEMENT (IOC);  Surgeon: Williams Che, MD;   Location: [facility];  Service: Ophthalmology;  Laterality: Right;  CDE:9.96  . CHOLECYSTECTOMY N/A 02/14/2015   Procedure: LAPAROSCOPIC CHOLECYSTECTOMY;  Surgeon: Aviva Signs, MD;  Location: [facility];  Service: General;  Laterality: N/A;  . MASTECTOMY     bilateral mastectomy-right breast cancer-left taken by choice  . SEPTOPLASTY  1995     Inpatient Medications: Scheduled Meds: . aspirin  81 mg Oral Daily  . enoxaparin (LOVENOX) injection  100 mg Subcutaneous Q24H  . fluticasone  2 spray Each Nare Daily  . fluticasone furoate-vilanterol  1 puff Inhalation Daily  . furosemide  20 mg Intravenous Daily  . levalbuterol  0.63 mg Nebulization Q8H  . pantoprazole  40 mg Oral Daily  . potassium chloride  20 mEq Oral BID  . sodium chloride flush  3 mL Intravenous Q12H  . warfarin  5 mg Oral Once  . warfarin   Does not apply Once  . Warfarin - Pharmacist Dosing Inpatient   Does not apply q1800   Continuous Infusions: . sodium chloride    . diltiazem (CARDIZEM) infusion 5 mg/hr (09/24/16 0930)   PRN Meds: sodium chloride, acetaminophen, albuterol, ALPRAZolam, ondansetron (ZOFRAN) IV, sodium chloride flush  Allergies:    Allergies  Allergen Reactions  . Sulfa Antibiotics Nausea And Vomiting  . Chocolate Rash    Social History:   Social History   Social History  . Marital status: Married    Spouse name: N/A  . Number of  children: N/A  . Years of education: N/A   Occupational History  . Not on file.   Social History Main Topics  . Smoking status: Former Smoker    Packs/day: 1.00    Years: 28.00    Types: Cigarettes    Quit date: 07/17/1983  . Smokeless tobacco: Never Used  . Alcohol use No  . Drug use: No  . Sexual activity: No   Other Topics Concern  . Not on file   Social History Narrative  . No narrative on file    Family History:   The patient's family history includes Aneurysm in her father; Cancer in her mother, sister, and sister. There is no history of  Anesthesia problems, Hypotension, Malignant hyperthermia, or Pseudochol deficiency.  ROS:  Please see the history of present illness.  ROS  All other ROS reviewed and negative.     Physical Exam/Data:   Vitals:   09/24/16 0500 09/24/16 0721 09/24/16 0928 09/24/16 0958  BP: 115/73     Pulse: 91 91    Resp: (!) 22 (!) 22    Temp:  98.1 F (36.7 C)    TempSrc:  Oral    SpO2: 97% 97% 100% 98%  Weight: 150 lb 12.7 oz (68.4 kg)     Height:        Intake/Output Summary (Last 24 hours) at 09/24/16 1043 Last data filed at 09/24/16 1035  Gross per 24 hour  Intake           369.25 ml  Output             1600 ml  Net         -1230.75 ml   Filed Weights   09/23/16 1344 09/23/16 1729 09/24/16 0500  Weight: 147 lb (66.7 kg) 150 lb 12.7 oz (68.4 kg) 150 lb 12.7 oz (68.4 kg)   Body mass index is 25.88 kg/m.  General:  Well nourished, well developed, in no acute distress HEENT: normal Lymph: no adenopathy Neck: no JVD Endocrine:  No thryomegaly Vascular: No carotid bruits; FA pulses 2+ b/l Cardiac: HR at upper normal limits/mildly tachycardic, irregular, normal S1S2, no S3, no murmurs. Lungs: Bibasilar crackles with mild b/l end-expiratory wheezes at bases.  Abd: soft, nontender, no hepatomegaly  Ext: no edema Musculoskeletal:  No deformities, BUE and BLE strength normal and equal Skin: warm and dry  Neuro:  CNs 2-12 intact, no focal abnormalities noted Psych:  Normal affect   Telemetry:  Telemetry was personally reviewed and demonstrates atrial fibrillation, HR 90-105 bpm, PVC's.  Relevant CV Studies: Echocardiogram pending  Laboratory Data:  Chemistry Recent Labs Lab 09/23/16 1344 09/24/16 0438  NA 143 138  K 3.9 3.2*  CL 108 102  CO2 24 26  GLUCOSE 128* 98  BUN 10 8  CREATININE 1.21* 1.03*  CALCIUM 9.8 8.5*  GFRNONAA 42* 51*  GFRAA 49* 59*  ANIONGAP 11 10     Recent Labs Lab 09/23/16 1344  PROT 7.9  ALBUMIN 4.1  AST 26  ALT 28  ALKPHOS 92  BILITOT  1.1   Hematology Recent Labs Lab 09/23/16 1344 09/24/16 0437  WBC 9.3 9.8  RBC 4.00 3.65*  HGB 12.4 11.4*  HCT 37.9 34.7*  MCV 94.8 95.1  MCH 31.0 31.2  MCHC 32.7 32.9  RDW 13.0 13.0  PLT 341 283   Cardiac Enzymes Recent Labs Lab 09/23/16 1344 09/23/16 2110 09/24/16 0437  TROPONINI <0.03 <0.03 <0.03   No results for input(s): TROPIPOC in the  last 168 hours.  BNP Recent Labs Lab 09/23/16 1344  BNP 709.0*    DDimer No results for input(s): DDIMER in the last 168 hours.  Radiology/Studies:  Dg Chest Portable 1 View  Result Date: 09/23/2016 CLINICAL DATA:  Weakness EXAM: PORTABLE CHEST 1 VIEW COMPARISON:  02/09/2016 FINDINGS: Cardiomegaly with vascular congestion and interstitial prominence, likely interstitial edema. Small right effusion. No acute bony abnormality. IMPRESSION: Mild edema/CHF.  Small right effusion. Electronically Signed   By: Rolm Baptise M.D.   On: 09/23/2016 14:26    Assessment and Plan:   1. New onset rapid atrial fibrillation: HR fairly well controlled with IV diltiazem 5 mg/hr. I will switch to oral 30 mg q 6 hours and if it remains well controlled, will switch to long-acting Cardizem tomorrow. CHADSVASC score is 5 thus high thromboembolic risk. Agree with Lovenox and warfarin for anticoagulation. Will stop ASA (no indication). Await echocardiogram to assess LV function and LA size. Contributing factors to a fib are age, gender, and hypertension.  2. Hypertension: Controlled. No changes.  3. New onset CHF: Possibly diastolic in etiology due to rapid atrial fibrillation. Await echocardiogram to assess LV function and LA size. Troponins normal.   Signed, Kate Sable, MD  09/24/2016 10:43 AM

## 2016-09-25 DIAGNOSIS — I517 Cardiomegaly: Secondary | ICD-10-CM

## 2016-09-25 LAB — CBC
HCT: 33.7 % — ABNORMAL LOW (ref 36.0–46.0)
Hemoglobin: 11.1 g/dL — ABNORMAL LOW (ref 12.0–15.0)
MCH: 31.3 pg (ref 26.0–34.0)
MCHC: 32.9 g/dL (ref 30.0–36.0)
MCV: 94.9 fL (ref 78.0–100.0)
PLATELETS: 258 10*3/uL (ref 150–400)
RBC: 3.55 MIL/uL — ABNORMAL LOW (ref 3.87–5.11)
RDW: 13.1 % (ref 11.5–15.5)
WBC: 7.8 10*3/uL (ref 4.0–10.5)

## 2016-09-25 LAB — PROTIME-INR
INR: 1.36
PROTHROMBIN TIME: 16.9 s — AB (ref 11.4–15.2)

## 2016-09-25 LAB — BASIC METABOLIC PANEL
Anion gap: 9 (ref 5–15)
BUN: 8 mg/dL (ref 6–20)
CALCIUM: 8.6 mg/dL — AB (ref 8.9–10.3)
CHLORIDE: 100 mmol/L — AB (ref 101–111)
CO2: 25 mmol/L (ref 22–32)
CREATININE: 1.09 mg/dL — AB (ref 0.44–1.00)
GFR calc Af Amer: 55 mL/min — ABNORMAL LOW (ref >60)
GFR calc non Af Amer: 48 mL/min — ABNORMAL LOW (ref >60)
GLUCOSE: 97 mg/dL (ref 65–99)
Potassium: 3.6 mmol/L (ref 3.5–5.1)
Sodium: 134 mmol/L — ABNORMAL LOW (ref 135–145)

## 2016-09-25 LAB — HEMOGLOBIN A1C
HEMOGLOBIN A1C: 4.9 % (ref 4.8–5.6)
MEAN PLASMA GLUCOSE: 94 mg/dL

## 2016-09-25 MED ORDER — DILTIAZEM HCL ER COATED BEADS 240 MG PO CP24
240.0000 mg | ORAL_CAPSULE | Freq: Every day | ORAL | Status: DC
  Administered 2016-09-25 – 2016-09-26 (×2): 240 mg via ORAL
  Filled 2016-09-25 (×2): qty 1

## 2016-09-25 MED ORDER — WARFARIN SODIUM 5 MG PO TABS
5.0000 mg | ORAL_TABLET | Freq: Once | ORAL | Status: AC
  Administered 2016-09-25: 5 mg via ORAL
  Filled 2016-09-25: qty 1

## 2016-09-25 MED ORDER — FUROSEMIDE 20 MG PO TABS
20.0000 mg | ORAL_TABLET | Freq: Every day | ORAL | Status: DC
  Administered 2016-09-25 – 2016-09-26 (×2): 20 mg via ORAL
  Filled 2016-09-25 (×2): qty 1

## 2016-09-25 NOTE — Progress Notes (Signed)
Patient is alert and oriented, vital signs are stable. Saline lock patent. No complaints of any distress. Family visiting at the bedside. Report given Tamika, RN. Patient to be transferred to room 305 via wheelchair.

## 2016-09-25 NOTE — Progress Notes (Signed)
ANTICOAGULATION CONSULT NOTE - follow up  Pharmacy Consult for Coumadin and Lovenox Indication: atrial fibrillation  Allergies  Allergen Reactions  . Sulfa Antibiotics Nausea And Vomiting  . Chocolate Rash   Patient Measurements: Height: 5\' 4"  (162.6 cm) Weight: 151 lb 0.2 oz (68.5 kg) IBW/kg (Calculated) : 54.7  Vital Signs: Temp: 98.1 F (36.7 C) (07/03 0731) Temp Source: Oral (07/03 0731) BP: 113/66 (07/03 0800) Pulse Rate: 108 (07/03 0800)  Labs:  Recent Labs  09/23/16 1344 09/23/16 1741 09/23/16 2110 09/24/16 0437 09/24/16 0438 09/25/16 0555  HGB 12.4  --   --  11.4*  --  11.1*  HCT 37.9  --   --  34.7*  --  33.7*  PLT 341  --   --  283  --  258  APTT  --  39*  --   --   --   --   LABPROT  --  14.3  --  15.6*  --  16.9*  INR  --  1.10  --  1.23  --  1.36  CREATININE 1.21*  --   --   --  1.03* 1.09*  TROPONINI <0.03  --  <0.03 <0.03  --   --    Estimated Creatinine Clearance: 41.1 mL/min (A) (by C-G formula based on SCr of 1.09 mg/dL (H)).  Medical History: Past Medical History:  Diagnosis Date  . Anxiety   . Arthritis   . Asthma   . Cancer [facility]) 2001   right breast  . Dyspnea   . GERD (gastroesophageal reflux disease)   . Hypertension    Medications:  Prescriptions Prior to Admission  Medication Sig Dispense Refill Last Dose  . acetaminophen (TYLENOL) 650 MG CR tablet Take 650 mg by mouth at bedtime.   Past Week at Unknown time  . albuterol (PROVENTIL HFA;VENTOLIN HFA) 108 (90 BASE) MCG/ACT inhaler Inhale 1-2 puffs into the lungs every 6 (six) hours as needed for wheezing or shortness of breath.   Past Week at Unknown time  . ALPRAZolam (XANAX) 0.5 MG tablet Take 0.5 mg by mouth 4 (four) times daily as needed. FOR SHORTNESS OF BREATH   09/22/2016 at 2300  . BREO ELLIPTA 100-25 MCG/INH AEPB Inhale 1 puff into the lungs daily as needed.    09/23/2016 at 0700  . fluticasone (FLONASE) 50 MCG/ACT nasal spray Place 2 sprays into both nostrils daily. Allergies  1 g 2 09/22/2016 at Unknown time  . Glycerin-Hypromellose-PEG 400 (VISINE TEARS OP) Apply 1 drop to eye daily as needed. Watery Eyes   Past Month at Unknown time  . ipratropium-albuterol (DUONEB) 0.5-2.5 (3) MG/3ML SOLN Take 3 mLs by nebulization every 4 (four) hours as needed.   09/23/2016 at 0900  . losartan (COZAAR) 100 MG tablet Take 1 tablet by mouth daily.   09/23/2016 at 0700  . naproxen sodium (ANAPROX) 220 MG tablet Take 440 mg by mouth 2 (two) times daily with a meal.   09/22/2016 at 1300  . omeprazole (PRILOSEC) 20 MG capsule Take 20 mg by mouth daily as needed.    09/22/2016 at 1300   Assessment: 76 yo female presents to ED with tachycardia. She complains of SOB and irregular heartbeat for a few days, which has worsened over the last couple of days.  Patient with new onset atrial fib. Pharmacy asked to start coumadin and bridge with lovenox. CHADSVASC score is 5 thus high thromboembolic risk. INR slowly rising.   Goal of Therapy:  INR 2-3 Monitor platelets by  anticoagulation protocol: Yes   Plan:  Continue Lovenox 1.5mg /kg (100mg ) sq q24h Coumadin 5mg  po x 1 Daily PT-INR Monitor for S/S of bleeding Coumadin education done  Isac Sarna, BS Pharm D, BCPS Clinical Pharmacist Pager [phone] 09/25/2016,8:26 AM

## 2016-09-25 NOTE — Care Management (Signed)
Benefits check for Lovenox 100 mg daily for 5 days  Patient uses [employer] [phone].   Results are as follows:  Lovenox 100 mg Daily for 5 days is a covered drug 30 day supply has a 95.00 copay and no prior auth. Is needed

## 2016-09-25 NOTE — Progress Notes (Signed)
Progress Note  Patient Name: Bailey Bishop Date of Encounter: 09/25/2016  Primary Cardiologist: Bronson Ing (new)  Subjective   Feeling very well. Wants to go home. HR still elevated.  Inpatient Medications    Scheduled Meds: . diltiazem  30 mg Oral Q6H  . enoxaparin (LOVENOX) injection  100 mg Subcutaneous Q24H  . fluticasone  2 spray Each Nare Daily  . fluticasone furoate-vilanterol  1 puff Inhalation Daily  . furosemide  20 mg Intravenous Daily  . levalbuterol  0.63 mg Nebulization Q8H  . mouth rinse  15 mL Mouth Rinse BID  . pantoprazole  40 mg Oral Daily  . potassium chloride  20 mEq Oral BID  . sodium chloride flush  3 mL Intravenous Q12H  . warfarin  5 mg Oral Once  . warfarin   Does not apply Once  . Warfarin - Pharmacist Dosing Inpatient   Does not apply q1800   Continuous Infusions: . sodium chloride     PRN Meds: sodium chloride, acetaminophen, albuterol, ALPRAZolam, ondansetron (ZOFRAN) IV, sodium chloride flush   Vital Signs    Vitals:   09/25/16 0747 09/25/16 0755 09/25/16 0800 09/25/16 0900  BP:   113/66 124/84  Pulse:   (!) 108 (!) 103  Resp:   20 20  Temp:      TempSrc:      SpO2: 95% 95% 93% 93%  Weight:      Height:        Intake/Output Summary (Last 24 hours) at 09/25/16 1008 Last data filed at 09/25/16 0950  Gross per 24 hour  Intake             1060 ml  Output             1700 ml  Net             -640 ml   Filed Weights   09/23/16 1729 09/24/16 0500 09/25/16 0500  Weight: 150 lb 12.7 oz (68.4 kg) 150 lb 12.7 oz (68.4 kg) 151 lb 0.2 oz (68.5 kg)    Telemetry    Rapid a fib - Personally Reviewed  ECG     Physical Exam   GEN: No acute distress.   Neck: No JVD Cardiac: Tachycardic, irregular, normal S1S2, no S3, no murmurs, rubs, or gallops.  Respiratory: Clear to auscultation bilaterally. GI: Soft, nontender, non-distended  MS: No edema; No deformity. Neuro:  Nonfocal  Psych: Normal affect   Labs     Chemistry Recent Labs Lab 09/23/16 1344 09/24/16 0438 09/25/16 0555  NA 143 138 134*  K 3.9 3.2* 3.6  CL 108 102 100*  CO2 24 26 25   GLUCOSE 128* 98 97  BUN 10 8 8   CREATININE 1.21* 1.03* 1.09*  CALCIUM 9.8 8.5* 8.6*  PROT 7.9  --   --   ALBUMIN 4.1  --   --   AST 26  --   --   ALT 28  --   --   ALKPHOS 92  --   --   BILITOT 1.1  --   --   GFRNONAA 42* 51* 48*  GFRAA 49* 59* 55*  ANIONGAP 11 10 9      Hematology Recent Labs Lab 09/23/16 1344 09/24/16 0437 09/25/16 0555  WBC 9.3 9.8 7.8  RBC 4.00 3.65* 3.55*  HGB 12.4 11.4* 11.1*  HCT 37.9 34.7* 33.7*  MCV 94.8 95.1 94.9  MCH 31.0 31.2 31.3  MCHC 32.7 32.9 32.9  RDW 13.0 13.0 13.1  PLT 341 283 258    Cardiac Enzymes Recent Labs Lab 09/23/16 1344 09/23/16 2110 09/24/16 0437  TROPONINI <0.03 <0.03 <0.03   No results for input(s): TROPIPOC in the last 168 hours.   BNP Recent Labs Lab 09/23/16 1344  BNP 709.0*     DDimer No results for input(s): DDIMER in the last 168 hours.   Radiology    Dg Chest Portable 1 View  Result Date: 09/23/2016 CLINICAL DATA:  Weakness EXAM: PORTABLE CHEST 1 VIEW COMPARISON:  02/09/2016 FINDINGS: Cardiomegaly with vascular congestion and interstitial prominence, likely interstitial edema. Small right effusion. No acute bony abnormality. IMPRESSION: Mild edema/CHF.  Small right effusion. Electronically Signed   By: Rolm Baptise M.D.   On: 09/23/2016 14:26    Cardiac Studies   Echocardiogram 09/24/16:  Study Conclusions  - Left ventricle: The cavity size was normal. Wall thickness was   normal. Systolic function was normal. The estimated ejection   fraction was in the range of 55% to 60%. Wall motion was normal;   there were no regional wall motion abnormalities. - Aortic valve: Mildly to moderately calcified annulus. Mildly   thickened leaflets. Valve area (VTI): 2.92 cm^2. Valve area   (Vmax): 2.73 cm^2. Valve area (Vmean): 2.61 cm^2. - Mitral valve: There was  moderate regurgitation. - Left atrium: The atrium was severely dilated. - Right atrium: The atrium was severely dilated. - Atrial septum: No defect or patent foramen ovale was identified. - Pulmonary arteries: Systolic pressure was moderately increased.   PA peak pressure: 49 mm Hg (S). - Pericardium, extracardiac: There is a small circumferential   pericardial effusion. - Technically adequate study.  Patient Profile     76 y.o. female admitted with new onset rapid atrial fibrillation and CHF.  Assessment & Plan    1. New onset rapid atrial fibrillation: HR elevated on short acting diltiazem but she is asymptomatic at present. I will switch to long-acting Cardizem 240 mg daily. CHADSVASC score is 5 thus high thromboembolic risk. Agree with Lovenox and warfarin for anticoagulation. INR subtherapeutic today (1.36). Severe left atrial enlargement indicates a low likelihood for maintaining sinus rhythm if she were to convert back. Contributing factors to a fib are age, gender, and hypertension.  2. Hypertension: Controlled. Monitor given change in diltiazem dose.  3. New onset CHF: Lungs clear today. Likely diastolic in etiology due to rapid atrial fibrillation. I will switch IV Lasix to oral 20 mg daily.  Signed, Kate Sable, MD  09/25/2016, 10:08 AM

## 2016-09-25 NOTE — Progress Notes (Signed)
Pt now on po cardizem, weaned off cardizem gtt on 09/24/16. Pt still in afib but rate controlled. Pt gets anxious easily and worries over small things but calms down with reassurance and keeping the room dim and  calm. She also is given anxiety medication to help and she sits up on the side of the bed at times to get her breath during these attacks. Pt states the neb treatments help her feel so much better and she can breathe better leading to less anxiety attacks. Pt is resting well at this time.   Jannet Calip Rica Mote, RN 09/25/16 2:50 AM

## 2016-09-25 NOTE — Progress Notes (Signed)
PROGRESS NOTE  Bailey Bishop  DHR:[id number] DOB: 1940/08/22 DOA: 09/23/2016 PCP: Caren Macadam, MD  Brief Narrative:   The patient is a 76 year old female with history of hypertension, GERD, aspirin, DJD, who presented with shortness of breath and wheezing. She denied chest pains. In the emergency department she was found to be in A. fib with RVR. Her chest x-ray revealed mild pulmonary edema and a small right pleural effusion. Her BNP was 709. She was started on a diltiazem drip and heparin drip and cardiology was consulted.  She has been transitioned to oral diltiazem however her heart rate has been persistently elevated at rest, low 100s overnight but while sitting up in bed to the 120s.  She has had intermittent hypotension since yesterday.  Transitioning to warfarin but has not completed education about bridging with lovenox.    Assessment & Plan:   Principal Problem:   Atrial fibrillation with RVR (HCC) Active Problems:   Essential hypertension   CKD (chronic kidney disease), stage II   Hyperglycemia   Hypokalemia   Asthma in adult without complication   Acute CHF ([place])  Newly diagnosed atrial fib with RVR.  CHA2DS2-VASc Scoreis >2 - transitioned to cardizem CD 240mg  daily -  Continue warfarin with lovenox injections -  Transfer to telemetry -  If blood pressure stable and HR trending down, plan to discharge to home in AM -  Appreciate cardiology assistance  Acute diastolic heart failure secondary to a-fib with RVR -  Ins and outs:  Neg 2.14L yesterday -  Transition to oral lasix -  ECHO with preserved EF, but moderate MR and severe biatrial enlargement, peak PA pressure 43mmHg  Essential hypertension, intermittent hypotension on oral dilt -  Holding losartan -  cardizem as above  Chronic mild asthma, stable, continue Breo and prn Xopenex.  Renal insufficiency/query CKD stage II. Patient is treated chronically with an ARB. There is no known history of  CKD, but her creatinine over the past 2 years range from 0.99-1.2. It was 1.21 on admission. -Losartan is on hold currently. -Lasix was given overnight and will be continued. Her creatinine has improved to 1.03. -Continue to monitor.  Mild hyperglycemia.  A1c 4.9  Normocytic anemia, mild but starting A/C -  Iron studies, B12, folate -  TSH -  Occult stool -  Repeat hgb in AM  DVT prophylaxis:  lovenox/coumadin Code Status:  DNR Family Communication:  Patient and her husband present at bedside Disposition Plan:  Likely home tomorrow if HR better controlled, ambulating, blood pressure stable   Consultants:   CArdiology   Procedures:  ECHO  Antimicrobials:  Anti-infectives    None       Subjective: Feeling better overall.  Less SOB.  Voiding frequently.  Denies chest pains.  No longer feels palpitations like she did earlier.  Has been having "anxiety" for which she has been using xopenex nebs which she feels helps  Objective: Vitals:   09/25/16 1140 09/25/16 1300 09/25/16 1400 09/25/16 1411  BP:  (!) 117/58 107/63   Pulse: (!) 101 99 99   Resp: (!) 21 (!) 24 (!) 23   Temp: 98.1 F (36.7 C)     TempSrc: Oral     SpO2: 94% 94% 95% 95%  Weight:      Height:        Intake/Output Summary (Last 24 hours) at 09/25/16 1440 Last data filed at 09/25/16 1300  Gross per 24 hour  Intake  1060 ml  Output             3200 ml  Net            -2140 ml   Filed Weights   09/23/16 1729 09/24/16 0500 09/25/16 0500  Weight: 68.4 kg (150 lb 12.7 oz) 68.4 kg (150 lb 12.7 oz) 68.5 kg (151 lb 0.2 oz)    Examination:  General exam:  Adult female.  No acute distress. Mild tachypnea HEENT:  NCAT, MMM Respiratory system: Clear to auscultation bilaterally Cardiovascular system: tachycardic, IRRR, normal S1/S2. No murmurs, rubs, gallops or clicks.  Warm extremities Gastrointestinal system: Normal active bowel sounds, soft, nondistended, nontender. MSK:  Normal tone and  bulk, no lower extremity edema Neuro:  Grossly intact    Data Reviewed: I have personally reviewed following labs and imaging studies  CBC:  Recent Labs Lab 09/23/16 1344 09/24/16 0437 09/25/16 0555  WBC 9.3 9.8 7.8  NEUTROABS 6.2  --   --   HGB 12.4 11.4* 11.1*  HCT 37.9 34.7* 33.7*  MCV 94.8 95.1 94.9  PLT 341 283 825   Basic Metabolic Panel:  Recent Labs Lab 09/23/16 1344 09/24/16 0438 09/25/16 0555  NA 143 138 134*  K 3.9 3.2* 3.6  CL 108 102 100*  CO2 24 26 25   GLUCOSE 128* 98 97  BUN 10 8 8   CREATININE 1.21* 1.03* 1.09*  CALCIUM 9.8 8.5* 8.6*  MG  --  1.8  --    GFR: Estimated Creatinine Clearance: 41.1 mL/min (A) (by C-G formula based on SCr of 1.09 mg/dL (H)). Liver Function Tests:  Recent Labs Lab 09/23/16 1344  AST 26  ALT 28  ALKPHOS 92  BILITOT 1.1  PROT 7.9  ALBUMIN 4.1   No results for input(s): LIPASE, AMYLASE in the last 168 hours. No results for input(s): AMMONIA in the last 168 hours. Coagulation Profile:  Recent Labs Lab 09/23/16 1741 09/24/16 0437 09/25/16 0555  INR 1.10 1.23 1.36   Cardiac Enzymes:  Recent Labs Lab 09/23/16 1344 09/23/16 2110 09/24/16 0437  TROPONINI <0.03 <0.03 <0.03   BNP (last 3 results) No results for input(s): PROBNP in the last 8760 hours. HbA1C:  Recent Labs  09/23/16 1344  HGBA1C 4.9   CBG:  Recent Labs Lab 09/23/16 2129  GLUCAP 120*   Lipid Profile:  Recent Labs  09/24/16 0438  CHOL 109  HDL 34*  LDLCALC 62  TRIG 66  CHOLHDL 3.2   Thyroid Function Tests:  Recent Labs  09/23/16 1741  TSH 0.975   Anemia Panel: No results for input(s): VITAMINB12, FOLATE, FERRITIN, TIBC, IRON, RETICCTPCT in the last 72 hours. Urine analysis:    Component Value Date/Time   COLORURINE YELLOW 02/09/2015 1540   APPEARANCEUR CLEAR 02/09/2015 1540   LABSPEC 1.010 02/09/2015 1540   PHURINE 7.5 02/09/2015 1540   GLUCOSEU NEGATIVE 02/09/2015 1540   HGBUR NEGATIVE 02/09/2015 1540    BILIRUBINUR NEGATIVE 02/09/2015 1540   KETONESUR NEGATIVE 02/09/2015 1540   PROTEINUR NEGATIVE 02/09/2015 1540   NITRITE NEGATIVE 02/09/2015 1540   LEUKOCYTESUR TRACE (A) 02/09/2015 1540   Sepsis Labs: @LABRCNTIP (procalcitonin:4,lacticidven:4)  ) Recent Results (from the past 240 hour(s))  MRSA PCR Screening     Status: None   Collection Time: 09/23/16  5:34 PM  Result Value Ref Range Status   MRSA by PCR NEGATIVE NEGATIVE Final    Comment:        The GeneXpert MRSA Assay (FDA approved for NASAL specimens only), is one  component of a comprehensive MRSA colonization surveillance program. It is not intended to diagnose MRSA infection nor to guide or monitor treatment for MRSA infections.       Radiology Studies: No results found.   Scheduled Meds: . diltiazem  240 mg Oral Daily  . enoxaparin (LOVENOX) injection  100 mg Subcutaneous Q24H  . fluticasone  2 spray Each Nare Daily  . fluticasone furoate-vilanterol  1 puff Inhalation Daily  . furosemide  20 mg Oral Daily  . levalbuterol  0.63 mg Nebulization Q8H  . mouth rinse  15 mL Mouth Rinse BID  . pantoprazole  40 mg Oral Daily  . potassium chloride  20 mEq Oral BID  . sodium chloride flush  3 mL Intravenous Q12H  . warfarin  5 mg Oral Once  . warfarin   Does not apply Once  . Warfarin - Pharmacist Dosing Inpatient   Does not apply q1800   Continuous Infusions: . sodium chloride       LOS: 2 days    Time spent: 30 min    Janece Canterbury, MD [facility] Pager [phone]  If 7PM-7AM, please contact night-coverage [email or web address] Password [email or web address] 09/25/2016, 2:40 PM

## 2016-09-26 DIAGNOSIS — E876 Hypokalemia: Secondary | ICD-10-CM

## 2016-09-26 DIAGNOSIS — N182 Chronic kidney disease, stage 2 (mild): Secondary | ICD-10-CM

## 2016-09-26 DIAGNOSIS — I10 Essential (primary) hypertension: Secondary | ICD-10-CM

## 2016-09-26 DIAGNOSIS — I5031 Acute diastolic (congestive) heart failure: Secondary | ICD-10-CM

## 2016-09-26 DIAGNOSIS — I4891 Unspecified atrial fibrillation: Secondary | ICD-10-CM

## 2016-09-26 LAB — BASIC METABOLIC PANEL
ANION GAP: 7 (ref 5–15)
BUN: 8 mg/dL (ref 6–20)
CHLORIDE: 98 mmol/L — AB (ref 101–111)
CO2: 29 mmol/L (ref 22–32)
Calcium: 8.7 mg/dL — ABNORMAL LOW (ref 8.9–10.3)
Creatinine, Ser: 1.18 mg/dL — ABNORMAL HIGH (ref 0.44–1.00)
GFR calc non Af Amer: 43 mL/min — ABNORMAL LOW (ref >60)
GFR, EST AFRICAN AMERICAN: 50 mL/min — AB (ref >60)
GLUCOSE: 96 mg/dL (ref 65–99)
POTASSIUM: 3.7 mmol/L (ref 3.5–5.1)
Sodium: 134 mmol/L — ABNORMAL LOW (ref 135–145)

## 2016-09-26 LAB — IRON AND TIBC
Iron: 32 ug/dL (ref 28–170)
SATURATION RATIOS: 13 % (ref 10.4–31.8)
TIBC: 252 ug/dL (ref 250–450)
UIBC: 220 ug/dL

## 2016-09-26 LAB — VITAMIN B12: VITAMIN B 12: 274 pg/mL (ref 180–914)

## 2016-09-26 LAB — FERRITIN: Ferritin: 92 ng/mL (ref 11–307)

## 2016-09-26 LAB — TSH: TSH: 1.811 u[IU]/mL (ref 0.350–4.500)

## 2016-09-26 LAB — PROTIME-INR
INR: 1.65
Prothrombin Time: 19.7 seconds — ABNORMAL HIGH (ref 11.4–15.2)

## 2016-09-26 LAB — FOLATE: FOLATE: 11.9 ng/mL (ref >5.9)

## 2016-09-26 MED ORDER — POTASSIUM CHLORIDE CRYS ER 20 MEQ PO TBCR: 20.0000 meq | EXTENDED_RELEASE_TABLET | Freq: Once | ORAL | 0 refills | Status: DC

## 2016-09-26 MED ORDER — WARFARIN SODIUM 5 MG PO TABS: 5.0000 mg | ORAL_TABLET | Freq: Once | ORAL | 0 refills | Status: DC

## 2016-09-26 MED ORDER — ENOXAPARIN SODIUM 100 MG/ML ~~LOC~~ SOLN: 100.0000 mg | SUBCUTANEOUS | 0 refills | Status: DC

## 2016-09-26 MED ORDER — ENOXAPARIN SODIUM 100 MG/ML ~~LOC~~ SOLN
100.0000 mg | SUBCUTANEOUS | Status: DC
  Administered 2016-09-26: 100 mg via SUBCUTANEOUS
  Filled 2016-09-26: qty 1

## 2016-09-26 MED ORDER — DILTIAZEM HCL ER COATED BEADS 240 MG PO CP24: 240.0000 mg | ORAL_CAPSULE | Freq: Every day | ORAL | 1 refills | Status: DC

## 2016-09-26 MED ORDER — WARFARIN SODIUM 5 MG PO TABS
5.0000 mg | ORAL_TABLET | Freq: Once | ORAL | Status: AC
  Administered 2016-09-26: 5 mg via ORAL
  Filled 2016-09-26: qty 1

## 2016-09-26 MED ORDER — FUROSEMIDE 20 MG PO TABS: 20.0000 mg | ORAL_TABLET | Freq: Every day | ORAL | 1 refills | Status: DC

## 2016-09-26 NOTE — Care Management (Signed)
Patient updated on cost of Lovenox. She feels comfortable administering Lovenox at home.

## 2016-09-26 NOTE — Care Management Note (Signed)
Case Management Note  Patient Details  Name: PAVNEET MARKWOOD MRN: [id number] Date of Birth: [birth year]  Subjective/Objective:  Adm with Afib with RVR. Ind PTA. No HH or DME. Has PCP, insurance with prescription coverage, still drives to appointments. Reports being very independent.                   Action/Plan: Plans to discharge home with self care. Feels comfortable administering Lovenox at home.   Expected Discharge Date:  09/25/16               Expected Discharge Plan:  Home/Self Care  In-House Referral:     Discharge planning Services  CM Consult, Medication Assistance  Post Acute Care Choice:    Choice offered to:  NA  DME Arranged:    DME Agency:     HH Arranged:    HH Agency:     Status of Service:  Completed, signed off  If discussed at [employer] of Stay Meetings, dates discussed:    Additional Comments:  Toy Eisemann, Chauncey Reading, RN 09/26/2016, 2:16 PM

## 2016-09-26 NOTE — Progress Notes (Signed)
Pt discharged home today per Dr. Tat. Pt's IV site D/C'd and WDL. Pt's VSS. Pt provided with home medication list, discharge instructions and prescriptions. Verbalized understanding. Pt left floor via WC in stable condition accompanied by RN. 

## 2016-09-26 NOTE — Progress Notes (Signed)
ANTICOAGULATION CONSULT NOTE - follow up  Pharmacy Consult for Coumadin and Lovenox Indication: atrial fibrillation  Allergies  Allergen Reactions  . Sulfa Antibiotics Nausea And Vomiting  . Chocolate Rash   Patient Measurements: Height: 5\' 4"  (162.6 cm) Weight: 147 lb 1.6 oz (66.7 kg) IBW/kg (Calculated) : 54.7  Vital Signs: Temp: 98.3 F (36.8 C) (07/04 0510) Temp Source: Oral (07/04 0510) BP: 115/83 (07/04 0510) Pulse Rate: 93 (07/04 0510)  Labs:  Recent Labs  09/23/16 1344  09/23/16 1741 09/23/16 2110 09/24/16 0437 09/24/16 0438 09/25/16 0555 09/26/16 0558  HGB 12.4  --   --   --  11.4*  --  11.1*  --   HCT 37.9  --   --   --  34.7*  --  33.7*  --   PLT 341  --   --   --  283  --  258  --   APTT  --   --  39*  --   --   --   --   --   LABPROT  --   < > 14.3  --  15.6*  --  16.9* 19.7*  INR  --   < > 1.10  --  1.23  --  1.36 1.65  CREATININE 1.21*  --   --   --   --  1.03* 1.09* 1.18*  TROPONINI <0.03  --   --  <0.03 <0.03  --   --   --   < > = values in this interval not displayed. Estimated Creatinine Clearance: 37.5 mL/min (A) (by C-G formula based on SCr of 1.18 mg/dL (H)).  Medical History: Past Medical History:  Diagnosis Date  . Anxiety   . Arthritis   . Asthma   . Cancer [facility]) 2001   right breast  . Dyspnea   . GERD (gastroesophageal reflux disease)   . Hypertension    Medications:  Prescriptions Prior to Admission  Medication Sig Dispense Refill Last Dose  . acetaminophen (TYLENOL) 650 MG CR tablet Take 650 mg by mouth at bedtime.   Past Week at Unknown time  . albuterol (PROVENTIL HFA;VENTOLIN HFA) 108 (90 BASE) MCG/ACT inhaler Inhale 1-2 puffs into the lungs every 6 (six) hours as needed for wheezing or shortness of breath.   Past Week at Unknown time  . ALPRAZolam (XANAX) 0.5 MG tablet Take 0.5 mg by mouth 4 (four) times daily as needed. FOR SHORTNESS OF BREATH   09/22/2016 at 2300  . BREO ELLIPTA 100-25 MCG/INH AEPB Inhale 1 puff into the  lungs daily as needed.    09/23/2016 at 0700  . fluticasone (FLONASE) 50 MCG/ACT nasal spray Place 2 sprays into both nostrils daily. Allergies 1 g 2 09/22/2016 at Unknown time  . Glycerin-Hypromellose-PEG 400 (VISINE TEARS OP) Apply 1 drop to eye daily as needed. Watery Eyes   Past Month at Unknown time  . ipratropium-albuterol (DUONEB) 0.5-2.5 (3) MG/3ML SOLN Take 3 mLs by nebulization every 4 (four) hours as needed.   09/23/2016 at 0900  . losartan (COZAAR) 100 MG tablet Take 1 tablet by mouth daily.   09/23/2016 at 0700  . naproxen sodium (ANAPROX) 220 MG tablet Take 440 mg by mouth 2 (two) times daily with a meal.   09/22/2016 at 1300  . omeprazole (PRILOSEC) 20 MG capsule Take 20 mg by mouth daily as needed.    09/22/2016 at 1300   Assessment: 76 yo female presents to ED with tachycardia. She complains of SOB and  irregular heartbeat for a few days, which has worsened over the last couple of days.  Patient with new onset atrial fib. Pharmacy asked to start coumadin and bridge with lovenox. CHADSVASC score is 5 thus high thromboembolic risk. INR rising.   Goal of Therapy:  INR 2-3 Monitor platelets by anticoagulation protocol: Yes   Plan:  Continue Lovenox 1.5mg /kg (100mg ) sq q24h Coumadin 5mg  po x 1 Daily PT-INR Monitor for S/S of bleeding Coumadin education done  Hart Robinsons, PharmD Clinical Pharmacist Pager:  [phone] 09/26/2016   09/26/2016,8:30 AM

## 2016-09-26 NOTE — Discharge Summary (Signed)
Physician Discharge Summary  Bailey Bishop GEX:[id number] DOB: 04-Feb-1941 DOA: 09/23/2016  PCP: Caren Macadam, MD  Admit date: 09/23/2016 Discharge date: 09/26/2016  Admitted From:Home Disposition:  Home   Recommendations for Outpatient Follow-up:  1. Follow up with PCP in 1-2 weeks 2. Please obtain BMP/CBC in one week 3. Please obtain INR on 09/28/16 and discontinue lovenox if INR 2-3   Discharge Condition: Stable CODE STATUS: FULL Diet recommendation: Heart Healthy   Brief/Interim Summary: 76 year old female with history of hypertension, GERD, aspirin, DJD, who presented with shortness of breath and wheezing. She denied chest pains. In the emergency department she was found to be in A. fib with RVR. Her chest x-ray revealed mild pulmonary edema and a small right pleural effusion. Her BNP was 709. She was started on a diltiazem drip and heparin drip and cardiology was consulted.  She has been transitioned to oral diltiazem however her heart rate has been persistently elevated at rest, low 100s overnight but while sitting up in bed to the 120s.  She has had intermittent hypotension since yesterday.    Discharge Diagnoses:  Newly diagnosed atrial fib with RVR.  CHA2DS2-VASc Scoreis >2 - IV diltiazem transitioned to cardizem CD 240mg  daily -  Continue warfarin with lovenox injections -  instructed pt to get INR checked at PCP or cardiology office on 7/6 and d/c lovenox if INR 2-3 -  Appreciate cardiology assistance -  INR 1.65 on day of discharge  Acute diastolic heart failure secondary to a-fib with RVR -  Ins and outs:  Neg 3.1L for the admission -  Transition to oral lasix--home with lasix 20 mg po daily -  ECHO with preserved EF, but moderate MR and severe biatrial enlargement, peak PA pressure 44mmHg -NEG 4 lbs for the admission; discharge weight 147  Essential hypertension -  Holding losartan -  cardizem as above  Chronic mild asthma, stable, continue Breoand  prn Xopenex.  Renal insufficiency/ CKD stage II. Patient is treated chronically with an ARB. There is no known history of CKD, but her creatinine over the past 2 years range from 0.99-1.2. It was 1.21 on admission. -Losartan is on hold currently-will not restart after d/c -Lasix was given overnight and will be continued. Her creatinine has improved to 1.03. -serum creatinine 1.18 at time of discharge  Mild hyperglycemia.  A1c 4.9  Normocytic anemia, mild but starting A/C -  Iron studies suggest anemia of chronic disease - B12--274 -  TSH--1.811 -  baseline Hgb ~12 - Hgb stable   Discharge Instructions  Discharge Instructions    Diet - low sodium heart healthy    Complete by:  As directed    Increase activity slowly    Complete by:  As directed      Allergies as of 09/26/2016      Reactions   Sulfa Antibiotics Nausea And Vomiting   Chocolate Rash      Medication List    STOP taking these medications   losartan 100 MG tablet Commonly known as:  COZAAR   naproxen sodium 220 MG tablet Commonly known as:  ANAPROX     TAKE these medications   acetaminophen 650 MG CR tablet Commonly known as:  TYLENOL Take 650 mg by mouth at bedtime.   albuterol 108 (90 Base) MCG/ACT inhaler Commonly known as:  PROVENTIL HFA;VENTOLIN HFA Inhale 1-2 puffs into the lungs every 6 (six) hours as needed for wheezing or shortness of breath.   ALPRAZolam 0.5 MG tablet Commonly  known as:  XANAX Take 0.5 mg by mouth 4 (four) times daily as needed. FOR SHORTNESS OF BREATH   BREO ELLIPTA 100-25 MCG/INH Aepb Generic drug:  fluticasone furoate-vilanterol Inhale 1 puff into the lungs daily as needed.   diltiazem 240 MG 24 hr capsule Commonly known as:  CARDIZEM CD Take 1 capsule (240 mg total) by mouth daily. Start taking on:  09/27/2016   enoxaparin 100 MG/ML injection Commonly known as:  LOVENOX Inject 1 mL (100 mg total) into the skin daily.   fluticasone 50 MCG/ACT nasal  spray Commonly known as:  FLONASE Place 2 sprays into both nostrils daily. Allergies   furosemide 20 MG tablet Commonly known as:  LASIX Take 1 tablet (20 mg total) by mouth daily. Start taking on:  09/27/2016   ipratropium-albuterol 0.5-2.5 (3) MG/3ML Soln Commonly known as:  DUONEB Take 3 mLs by nebulization every 4 (four) hours as needed.   omeprazole 20 MG capsule Commonly known as:  PRILOSEC Take 20 mg by mouth daily as needed.   potassium chloride SA 20 MEQ tablet Commonly known as:  K-DUR,KLOR-CON Take 1 tablet (20 mEq total) by mouth once.   VISINE TEARS OP Apply 1 drop to eye daily as needed. Watery Eyes   warfarin 5 MG tablet Commonly known as:  COUMADIN Take 1 tablet (5 mg total) by mouth once.      Follow-up Information    Herminio Commons, MD Follow up in 1 week(s).   Specialty:  Cardiology Contact information: [place] [address] [phone]          Allergies  Allergen Reactions  . Sulfa Antibiotics Nausea And Vomiting  . Chocolate Rash    Consultations:  cardiology   Procedures/Studies: Dg Chest Portable 1 View  Result Date: 09/23/2016 CLINICAL DATA:  Weakness EXAM: PORTABLE CHEST 1 VIEW COMPARISON:  02/09/2016 FINDINGS: Cardiomegaly with vascular congestion and interstitial prominence, likely interstitial edema. Small right effusion. No acute bony abnormality. IMPRESSION: Mild edema/CHF.  Small right effusion. Electronically Signed   By: Rolm Baptise M.D.   On: 09/23/2016 14:26         Discharge Exam: Vitals:   09/26/16 0510 09/26/16 1300  BP: 115/83 (!) 122/58  Pulse: 93 84  Resp: 20 18  Temp: 98.3 F (36.8 C) 98.8 F (37.1 C)   Vitals:   09/26/16 0510 09/26/16 0718 09/26/16 0721 09/26/16 1300  BP: 115/83   (!) 122/58  Pulse: 93   84  Resp: 20   18  Temp: 98.3 F (36.8 C)   98.8 F (37.1 C)  TempSrc: Oral   Oral  SpO2: 99% 97% 97% 98%  Weight:   66.7 kg (147 lb 1.6 oz)   Height:        General:  Pt is alert, awake, not in acute distress Cardiovascular: RRR, S1/S2 +, no rubs, no gallops Respiratory: CTA bilaterally, no wheezing, no rhonchi Abdominal: Soft, NT, ND, bowel sounds + Extremities: no edema, no cyanosis   The results of significant diagnostics from this hospitalization (including imaging, microbiology, ancillary and laboratory) are listed below for reference.    Significant Diagnostic Studies: Dg Chest Portable 1 View  Result Date: 09/23/2016 CLINICAL DATA:  Weakness EXAM: PORTABLE CHEST 1 VIEW COMPARISON:  02/09/2016 FINDINGS: Cardiomegaly with vascular congestion and interstitial prominence, likely interstitial edema. Small right effusion. No acute bony abnormality. IMPRESSION: Mild edema/CHF.  Small right effusion. Electronically Signed   By: Rolm Baptise M.D.   On: 09/23/2016  14:26     Microbiology: Recent Results (from the past 240 hour(s))  MRSA PCR Screening     Status: None   Collection Time: 09/23/16  5:34 PM  Result Value Ref Range Status   MRSA by PCR NEGATIVE NEGATIVE Final    Comment:        The GeneXpert MRSA Assay (FDA approved for NASAL specimens only), is one component of a comprehensive MRSA colonization surveillance program. It is not intended to diagnose MRSA infection nor to guide or monitor treatment for MRSA infections.      Labs: Basic Metabolic Panel:  Recent Labs Lab 09/23/16 1344 09/24/16 0438 09/25/16 0555 09/26/16 0558  NA 143 138 134* 134*  K 3.9 3.2* 3.6 3.7  CL 108 102 100* 98*  CO2 24 26 25 29   GLUCOSE 128* 98 97 96  BUN 10 8 8 8   CREATININE 1.21* 1.03* 1.09* 1.18*  CALCIUM 9.8 8.5* 8.6* 8.7*  MG  --  1.8  --   --    Liver Function Tests:  Recent Labs Lab 09/23/16 1344  AST 26  ALT 28  ALKPHOS 92  BILITOT 1.1  PROT 7.9  ALBUMIN 4.1   No results for input(s): LIPASE, AMYLASE in the last 168 hours. No results for input(s): AMMONIA in the last 168 hours. CBC:  Recent Labs Lab 09/23/16 1344  09/24/16 0437 09/25/16 0555  WBC 9.3 9.8 7.8  NEUTROABS 6.2  --   --   HGB 12.4 11.4* 11.1*  HCT 37.9 34.7* 33.7*  MCV 94.8 95.1 94.9  PLT 341 283 258   Cardiac Enzymes:  Recent Labs Lab 09/23/16 1344 09/23/16 2110 09/24/16 0437  TROPONINI <0.03 <0.03 <0.03   BNP: Invalid input(s): POCBNP CBG:  Recent Labs Lab 09/23/16 2129  GLUCAP 120*    Time coordinating discharge:  Greater than 30 minutes  Signed:  Mishika Flippen, DO [facility] Pager: [phone] 09/26/2016, 5:41 PM

## 2016-10-03 ENCOUNTER — Encounter: Payer: Self-pay | Admitting: Cardiology

## 2016-10-03 ENCOUNTER — Ambulatory Visit (INDEPENDENT_AMBULATORY_CARE_PROVIDER_SITE_OTHER): Payer: Medicare Other | Admitting: Cardiology

## 2016-10-03 VITALS — BP 130/76 | HR 97 | Ht 64.0 in | Wt 153.0 lb

## 2016-10-03 DIAGNOSIS — J449 Chronic obstructive pulmonary disease, unspecified: Secondary | ICD-10-CM | POA: Insufficient documentation

## 2016-10-03 DIAGNOSIS — I509 Heart failure, unspecified: Secondary | ICD-10-CM | POA: Diagnosis not present

## 2016-10-03 DIAGNOSIS — I4891 Unspecified atrial fibrillation: Secondary | ICD-10-CM

## 2016-10-03 DIAGNOSIS — J438 Other emphysema: Secondary | ICD-10-CM

## 2016-10-03 DIAGNOSIS — Z7901 Long term (current) use of anticoagulants: Secondary | ICD-10-CM | POA: Insufficient documentation

## 2016-10-03 MED ORDER — METOPROLOL TARTRATE 25 MG PO TABS: 12.5000 mg | ORAL_TABLET | Freq: Two times a day (BID) | ORAL | 3 refills | Status: DC

## 2016-10-03 NOTE — Progress Notes (Signed)
10/03/2016 [place]   [birth year]  [id number]  [facility], [facility] Primary Cardiologist: Dr Bronson Ing  HPI:  Pleasant 76 y/o female with COPD and HTN, admitted with dyspnea and found to be in CHF and AF with RVR of unknown duration. She was admitted and diuresed. Her rate was controlled and she was placed on Coumadin. She is in the office today for follow up. Her echo showed preserved LVF and severe LAE. She is in the office today for follow up. She say she has felt well since discharge. She remains unaware of her AF.    Current Outpatient Prescriptions  Medication Sig Dispense Refill  . acetaminophen (TYLENOL) 650 MG CR tablet Take 650 mg by mouth at bedtime.    Marland Kitchen albuterol (PROVENTIL HFA;VENTOLIN HFA) 108 (90 BASE) MCG/ACT inhaler Inhale 1-2 puffs into the lungs every 6 (six) hours as needed for wheezing or shortness of breath.    . ALPRAZolam (XANAX) 0.5 MG tablet Take 0.5 mg by mouth 4 (four) times daily as needed. FOR SHORTNESS OF BREATH    . BREO ELLIPTA 100-25 MCG/INH AEPB Inhale 1 puff into the lungs daily as needed.     . diltiazem (CARDIZEM CD) 240 MG 24 hr capsule Take 1 capsule (240 mg total) by mouth daily. 30 capsule 1  . enoxaparin (LOVENOX) 100 MG/ML injection Inject 1 mL (100 mg total) into the skin daily. 2 Syringe 0  . fluticasone (FLONASE) 50 MCG/ACT nasal spray Place 2 sprays into both nostrils daily. Allergies 1 g 2  . furosemide (LASIX) 20 MG tablet Take 1 tablet (20 mg total) by mouth daily. 30 tablet 1  . Glycerin-Hypromellose-PEG 400 (VISINE TEARS OP) Apply 1 drop to eye daily as needed. Watery Eyes    . ipratropium-albuterol (DUONEB) 0.5-2.5 (3) MG/3ML SOLN Take 3 mLs by nebulization every 4 (four) hours as needed.    Marland Kitchen omeprazole (PRILOSEC) 20 MG capsule Take 20 mg by mouth daily as needed.     . metoprolol tartrate (LOPRESSOR) 25 MG tablet Take 0.5 tablets (12.5 mg total) by mouth 2 (two) times daily. 90 tablet 3  .  potassium chloride SA (K-DUR,KLOR-CON) 20 MEQ tablet Take 1 tablet (20 mEq total) by mouth once. 30 tablet 0  . warfarin (COUMADIN) 5 MG tablet Take 1 tablet (5 mg total) by mouth once. 30 tablet 0   No current facility-administered medications for this visit.     Allergies  Allergen Reactions  . Sulfa Antibiotics Nausea And Vomiting  . Chocolate Rash    Past Medical History:  Diagnosis Date  . Anxiety   . Arthritis   . Asthma   . Cancer [facility]) 2001   right breast  . Dyspnea   . GERD (gastroesophageal reflux disease)   . Hypertension     Social History   Social History  . Marital status: Married    Spouse name: N/A  . Number of children: N/A  . Years of education: N/A   Occupational History  . Not on file.   Social History Main Topics  . Smoking status: Former Smoker    Packs/day: 1.00    Years: 28.00    Types: Cigarettes    Quit date: 07/17/1983  . Smokeless tobacco: Never Used  . Alcohol use No  . Drug use: No  . Sexual activity: No   Other Topics Concern  . Not on file   Social History Narrative  . No narrative on file  Family History  Problem Relation Age of Onset  . Cancer Mother   . Aneurysm Father   . Cancer Sister   . Cancer Sister   . Anesthesia problems Neg Hx   . Hypotension Neg Hx   . Malignant hyperthermia Neg Hx   . Pseudochol deficiency Neg Hx      Review of Systems: General: negative for chills, fever, night sweats or weight changes.  Cardiovascular: negative for chest pain, dyspnea on exertion, edema, orthopnea, palpitations, paroxysmal nocturnal dyspnea or shortness of breath Dermatological: negative for rash Respiratory: negative for cough or wheezing Urologic: negative for hematuria Abdominal: negative for nausea, vomiting, diarrhea, bright red blood per rectum, melena, or hematemesis Neurologic: negative for visual changes, syncope, or dizziness All other systems reviewed and are otherwise negative except as noted  above.    Blood pressure 130/76, pulse 97, height 5\' 4"  (1.626 m), weight 153 lb (69.4 kg), SpO2 97 %.  General appearance: alert, cooperative and no distress Neck: no carotid bruit and no JVD Lungs: clear to auscultation bilaterally Heart: irregularly irregular rhythm Extremities: extremities normal, atraumatic, no cyanosis or edema Neurologic: Grossly normal  EKG AF with VR 104  ASSESSMENT AND PLAN:   Atrial fibrillation with RVR [name]) New onset July 2018 Normal LVF, severe LAE. She remains in AF- asymptomatic, HR 104  Anticoagulated CHA2 DS2 VASc=5- Coumadin Rx  Acute CHF ([place]) Secondary to AF with RVR July 2018  COPD (chronic obstructive pulmonary disease) (HCC) On inhalers PRN which she has not used since DC   PLAN  I added low dose beta blocker for additional rate control. F/U in 4-6 weeks.   Kerin Ransom PA-C 10/03/2016 2:42 PM

## 2016-10-03 NOTE — Patient Instructions (Signed)
Your physician recommends that you schedule a follow-up appointment in: 4-6 [place] has recommended you make the following change in your medication:  Start Lopressor 12.5 mg Two Times Daily   If you need a refill on your cardiac medications before your next appointment, please call your pharmacy.  Thank you for choosing [facility]!

## 2016-10-03 NOTE — Assessment & Plan Note (Signed)
Secondary to AF with RVR July 2018

## 2016-10-03 NOTE — Assessment & Plan Note (Signed)
CHA2 DS2 VASc=5- Coumadin Rx

## 2016-10-03 NOTE — Assessment & Plan Note (Signed)
New onset July 2018 Normal LVF, severe LAE. She remains in AF- asymptomatic, HR 104

## 2016-10-03 NOTE — Assessment & Plan Note (Signed)
On inhalers PRN which she has not used since DC

## 2016-10-17 ENCOUNTER — Telehealth: Payer: Self-pay | Admitting: Cardiology

## 2016-10-17 ENCOUNTER — Telehealth: Payer: Self-pay

## 2016-10-17 NOTE — Telephone Encounter (Signed)
Instructed pt to increase lasix to 40 mg bid- and call Friday with updates. She voiced understanding.

## 2016-10-17 NOTE — Telephone Encounter (Signed)
Returned pt call, she stated that she is having problems breathing. She did say that she has gained 6 lbs in the past week ( from 147 to 153) ans she has noticed her legs swelling. She cannot lay down in her bed to sleep as she gets to short of breath. She stated that she has been taking all of her medications as prescribed.( Pt. Saw Kilroy in office, but [name] in hospital.) Please advise.

## 2016-10-17 NOTE — Telephone Encounter (Signed)
SOB and feet swelling / please return call / tg

## 2016-10-17 NOTE — Telephone Encounter (Signed)
Increase lasix to 40mg  daily, update [place] again on Friday  J Governor Matos MD

## 2016-10-18 ENCOUNTER — Other Ambulatory Visit: Payer: Self-pay | Admitting: Cardiovascular Disease

## 2016-10-18 ENCOUNTER — Other Ambulatory Visit: Payer: Self-pay

## 2016-10-18 MED ORDER — FUROSEMIDE 20 MG PO TABS: 20.0000 mg | ORAL_TABLET | Freq: Every day | ORAL | 1 refills | Status: DC

## 2016-10-18 NOTE — Telephone Encounter (Signed)
°*  STAT* If patient is at the pharmacy, call can be transferred to refill team.   1. Which medications need to be refilled? (please list name of each medication and dose if known)  furosemide (LASIX) 20 MG tablet [[id number]]    2. Which pharmacy/location (including street and city if local pharmacy) is medication to be sent to? [place]  3. Do they need a 30 day or 90 day supply?

## 2016-10-19 NOTE — Telephone Encounter (Signed)
Pt came into office stating that she's feeling better since taking the 40mg  lasix --states she's needing the new Rx sent to [facility]

## 2016-10-20 ENCOUNTER — Telehealth: Payer: Self-pay | Admitting: Physician Assistant

## 2016-10-20 ENCOUNTER — Other Ambulatory Visit: Payer: Self-pay | Admitting: Physician Assistant

## 2016-10-20 MED ORDER — FUROSEMIDE 40 MG PO TABS: 40.0000 mg | ORAL_TABLET | Freq: Every day | ORAL | 3 refills | Status: DC

## 2016-10-20 NOTE — Telephone Encounter (Signed)
Patient was having orthopnea episodes and her lasix to increased to 40mg  daily (although one nurse note mentioned 40mg  BID, I have confirmed with patient, she is on 40mg  daily instead). She has run out of her medication, I have sent in 30 tablets of 40mg  lasix with 3 refill to her pharmacy, given newly increased lasix, she will need a repeat BMET in 1 week, I will message my staff to arrange for labwork in [place] in 1 week before her next cardiology office visit.   Hilbert Corrigan PA Pager: [phone]

## 2016-10-22 ENCOUNTER — Telehealth: Payer: Self-pay | Admitting: Adult Health

## 2016-10-22 NOTE — Telephone Encounter (Signed)
Please call patient regarding dosage of lasix that was sent to pharmacy on 7/26. / tg

## 2016-10-22 NOTE — Telephone Encounter (Signed)
Pt called back after speaking with weekend PA, dose of lasix 40 mg daily

## 2016-10-23 ENCOUNTER — Telehealth: Payer: Self-pay | Admitting: *Deleted

## 2016-10-23 DIAGNOSIS — Z79899 Other long term (current) drug therapy: Secondary | ICD-10-CM

## 2016-10-23 NOTE — Telephone Encounter (Signed)
Recommendations discussed with patient, who verbalized understanding and thanks. BMET ordered for [facility] at patient preference, she's aware to call if further questions or needs & that we will communicate results once reviewed by PA.

## 2016-10-23 NOTE — Telephone Encounter (Signed)
-----   Message from [place], [place] sent at 10/20/2016  8:43 AM EDT ----- Regarding: outpatient BMET Please arrange outpatient BMET in 1 week in [place] for Mrs. Mastel after her lasix increased for what appears to be acute on chronic diastolic heart failure. She is Dr. Court Joy patient and has followup on 8/17.   Hilbert Corrigan PA Pager: [phone]

## 2016-10-24 DIAGNOSIS — Z79899 Other long term (current) drug therapy: Secondary | ICD-10-CM | POA: Diagnosis not present

## 2016-10-25 ENCOUNTER — Telehealth: Payer: Self-pay | Admitting: Adult Health

## 2016-10-25 ENCOUNTER — Other Ambulatory Visit: Payer: Self-pay

## 2016-10-25 LAB — BASIC METABOLIC PANEL
BUN / CREAT RATIO: 12 (ref 12–28)
BUN: 16 mg/dL (ref 8–27)
CO2: 25 mmol/L (ref 20–29)
CREATININE: 1.29 mg/dL — AB (ref 0.57–1.00)
Calcium: 9.6 mg/dL (ref 8.7–10.3)
Chloride: 99 mmol/L (ref 96–106)
GFR calc Af Amer: 46 mL/min/{1.73_m2} — ABNORMAL LOW (ref >59)
GFR, EST NON AFRICAN AMERICAN: 40 mL/min/{1.73_m2} — AB (ref >59)
GLUCOSE: 98 mg/dL (ref 65–99)
POTASSIUM: 3.5 mmol/L (ref 3.5–5.2)
SODIUM: 142 mmol/L (ref 134–144)

## 2016-10-25 MED ORDER — POTASSIUM CHLORIDE CRYS ER 20 MEQ PO TBCR: 20.0000 meq | EXTENDED_RELEASE_TABLET | Freq: Once | ORAL | 3 refills | Status: DC

## 2016-10-25 MED ORDER — WARFARIN SODIUM 5 MG PO TABS: 5.0000 mg | ORAL_TABLET | Freq: Once | ORAL | 0 refills | Status: DC

## 2016-10-25 NOTE — Telephone Encounter (Signed)
Apt with Rennie Natter RN 10/31/16 at 2:30 pm

## 2016-10-25 NOTE — Telephone Encounter (Signed)
I have reviewed labs. She is hypokalemic. Make sure she is taking potassium and only daily dose of lasix, 40 mEq daily. She may need to go up on potassium if necessary.

## 2016-10-25 NOTE — Telephone Encounter (Signed)
Please call patient regarding lab work and change of dosage / tg

## 2016-10-25 NOTE — Telephone Encounter (Signed)
Pt confirmed she takes lasix 40 mg daily, needs refill for potassium,has a tablet left.Also needs consult apt with L Joneen Caraway RN as she was started on Coumadin in July for AFib

## 2016-10-25 NOTE — Telephone Encounter (Signed)
Labs ordered by Almyra Deforest  PA-C, over the weekend. Will forward to Dr Purcell Nails

## 2016-10-25 NOTE — Telephone Encounter (Signed)
Coumadin 5 mg #5 refilled at [facility], pt has apt 10/31/16 with coumadin clinic

## 2016-10-25 NOTE — Addendum Note (Signed)
Addended by: Barbarann Ehlers A on: 10/25/2016 11:18 AM   Modules accepted: Orders

## 2016-10-29 ENCOUNTER — Telehealth: Payer: Self-pay | Admitting: Cardiology

## 2016-10-29 ENCOUNTER — Other Ambulatory Visit (HOSPITAL_COMMUNITY)
Admission: RE | Admit: 2016-10-29 | Discharge: 2016-10-29 | Disposition: A | Discharge status: Home / Self Care | Payer: Medicare Other | Source: Ambulatory Visit | Attending: Cardiovascular Disease | Admitting: Cardiovascular Disease

## 2016-10-29 DIAGNOSIS — R319 Hematuria, unspecified: Secondary | ICD-10-CM

## 2016-10-29 LAB — PROTIME-INR
INR: 3.09
Prothrombin Time: 32.6 seconds — ABNORMAL HIGH (ref 11.4–15.2)

## 2016-10-29 LAB — URINALYSIS, ROUTINE W REFLEX MICROSCOPIC
Bilirubin Urine: NEGATIVE
Glucose, UA: 250 mg/dL — AB
NITRITE: POSITIVE — AB
PH: 6.5 (ref 5.0–8.0)
Protein, ur: >300 mg/dL — AB
SPECIFIC GRAVITY, URINE: 1.025 (ref 1.005–1.030)

## 2016-10-29 LAB — URINALYSIS, MICROSCOPIC (REFLEX)

## 2016-10-29 LAB — APTT: aPTT: 51 seconds — ABNORMAL HIGH (ref 24–36)

## 2016-10-29 NOTE — Telephone Encounter (Signed)
Patient called asking to have blood work done for blood in her Urine.

## 2016-10-29 NOTE — Telephone Encounter (Signed)
Patient's husband called and notified to hold coumadin for now. Pt to come to [facility] lab for PT lab draw and UA.

## 2016-10-29 NOTE — Telephone Encounter (Signed)
Patient called and states that blood in her urine. She states that it does not occur each time she urinates. The first time was last night. Pt denies pain with urine. Please advise.

## 2016-10-29 NOTE — Telephone Encounter (Signed)
Please call patient in regards to blood in urine. / tg

## 2016-10-30 ENCOUNTER — Telehealth: Payer: Self-pay | Admitting: Cardiovascular Disease

## 2016-10-30 ENCOUNTER — Telehealth: Payer: Self-pay | Admitting: *Deleted

## 2016-10-30 DIAGNOSIS — R319 Hematuria, unspecified: Secondary | ICD-10-CM

## 2016-10-30 DIAGNOSIS — I4891 Unspecified atrial fibrillation: Secondary | ICD-10-CM

## 2016-10-30 MED ORDER — CIPROFLOXACIN HCL 250 MG PO TABS: 250.0000 mg | ORAL_TABLET | Freq: Two times a day (BID) | ORAL | 0 refills | Status: DC

## 2016-10-30 NOTE — Telephone Encounter (Signed)
Ok to start Cipro. Warfarin on hold. Repeat INR this Friday.

## 2016-10-30 NOTE — Telephone Encounter (Signed)
[place] called in reference to recent RX called Cipro 250mg  in today. Patient states that she stoped coumdin recently (2 days)  Please advise.   [phone]. Pharmacy states that if she has just recently stopped taking coumdin and mixing Cipro will cause patient to bleed more.

## 2016-10-30 NOTE — Telephone Encounter (Signed)
[  10/30/2016 2:19 PM] Kate Sable:  Give her ciprofloxacin 250 mg bid x 3 days and schedule her with urology

## 2016-10-30 NOTE — Telephone Encounter (Signed)
-----   Message from Josue Hector, MD sent at 10/30/2016 12:22 PM EDT ----- Had hematuria f/u primary may have UTI INR was a bit high Hold coumadin until hematuria resolved this is Bailey Bishop's patient F/U primary for ? UTI and make sure has culture done

## 2016-10-30 NOTE — Addendum Note (Signed)
Addended by: Hosie Poisson R on: 10/30/2016 02:35 PM   Modules accepted: Orders

## 2016-10-30 NOTE — Telephone Encounter (Signed)
Pharmacy informed.   Patient informed and verbalized understanding of plan.

## 2016-11-01 ENCOUNTER — Emergency Department (HOSPITAL_COMMUNITY)
Admission: EM | Admit: 2016-11-01 | Discharge: 2016-11-01 | Disposition: A | Discharge status: Home / Self Care | Payer: Medicare Other | Attending: Emergency Medicine | Admitting: Emergency Medicine

## 2016-11-01 ENCOUNTER — Emergency Department (HOSPITAL_COMMUNITY): Payer: Medicare Other

## 2016-11-01 ENCOUNTER — Encounter (HOSPITAL_COMMUNITY): Payer: Self-pay

## 2016-11-01 DIAGNOSIS — N182 Chronic kidney disease, stage 2 (mild): Secondary | ICD-10-CM | POA: Diagnosis not present

## 2016-11-01 DIAGNOSIS — Z853 Personal history of malignant neoplasm of breast: Secondary | ICD-10-CM | POA: Diagnosis not present

## 2016-11-01 DIAGNOSIS — N390 Urinary tract infection, site not specified: Secondary | ICD-10-CM | POA: Insufficient documentation

## 2016-11-01 DIAGNOSIS — I481 Persistent atrial fibrillation: Secondary | ICD-10-CM | POA: Diagnosis not present

## 2016-11-01 DIAGNOSIS — J45909 Unspecified asthma, uncomplicated: Secondary | ICD-10-CM | POA: Insufficient documentation

## 2016-11-01 DIAGNOSIS — Z79899 Other long term (current) drug therapy: Secondary | ICD-10-CM | POA: Insufficient documentation

## 2016-11-01 DIAGNOSIS — K5730 Diverticulosis of large intestine without perforation or abscess without bleeding: Secondary | ICD-10-CM | POA: Diagnosis not present

## 2016-11-01 DIAGNOSIS — R310 Gross hematuria: Secondary | ICD-10-CM | POA: Insufficient documentation

## 2016-11-01 DIAGNOSIS — I129 Hypertensive chronic kidney disease with stage 1 through stage 4 chronic kidney disease, or unspecified chronic kidney disease: Secondary | ICD-10-CM | POA: Insufficient documentation

## 2016-11-01 DIAGNOSIS — Z7901 Long term (current) use of anticoagulants: Secondary | ICD-10-CM | POA: Diagnosis not present

## 2016-11-01 DIAGNOSIS — I4819 Other persistent atrial fibrillation: Secondary | ICD-10-CM

## 2016-11-01 DIAGNOSIS — N289 Disorder of kidney and ureter, unspecified: Secondary | ICD-10-CM

## 2016-11-01 DIAGNOSIS — Z87891 Personal history of nicotine dependence: Secondary | ICD-10-CM | POA: Insufficient documentation

## 2016-11-01 DIAGNOSIS — D649 Anemia, unspecified: Secondary | ICD-10-CM | POA: Diagnosis not present

## 2016-11-01 DIAGNOSIS — R319 Hematuria, unspecified: Secondary | ICD-10-CM | POA: Diagnosis present

## 2016-11-01 LAB — PROTIME-INR
INR: 1.74
PROTHROMBIN TIME: 20.5 s — AB (ref 11.4–15.2)

## 2016-11-01 LAB — URINE CULTURE

## 2016-11-01 LAB — CBC WITH DIFFERENTIAL/PLATELET
BASOS ABS: 0 10*3/uL (ref 0.0–0.1)
BASOS PCT: 0 %
EOS ABS: 0.4 10*3/uL (ref 0.0–0.7)
Eosinophils Relative: 4 %
HEMATOCRIT: 35.3 % — AB (ref 36.0–46.0)
HEMOGLOBIN: 11.7 g/dL — AB (ref 12.0–15.0)
Lymphocytes Relative: 16 %
Lymphs Abs: 1.6 10*3/uL (ref 0.7–4.0)
MCH: 30.2 pg (ref 26.0–34.0)
MCHC: 33.1 g/dL (ref 30.0–36.0)
MCV: 91 fL (ref 78.0–100.0)
Monocytes Absolute: 0.8 10*3/uL (ref 0.1–1.0)
Monocytes Relative: 8 %
NEUTROS ABS: 7.2 10*3/uL (ref 1.7–7.7)
NEUTROS PCT: 72 %
Platelets: 238 10*3/uL (ref 150–400)
RBC: 3.88 MIL/uL (ref 3.87–5.11)
RDW: 12.8 % (ref 11.5–15.5)
WBC: 9.9 10*3/uL (ref 4.0–10.5)

## 2016-11-01 LAB — BASIC METABOLIC PANEL
ANION GAP: 9 (ref 5–15)
BUN: 15 mg/dL (ref 6–20)
CALCIUM: 9.7 mg/dL (ref 8.9–10.3)
CO2: 27 mmol/L (ref 22–32)
CREATININE: 1.3 mg/dL — AB (ref 0.44–1.00)
Chloride: 105 mmol/L (ref 101–111)
GFR calc non Af Amer: 38 mL/min — ABNORMAL LOW (ref >60)
GFR, EST AFRICAN AMERICAN: 44 mL/min — AB (ref >60)
Glucose, Bld: 113 mg/dL — ABNORMAL HIGH (ref 65–99)
Potassium: 3.6 mmol/L (ref 3.5–5.1)
SODIUM: 141 mmol/L (ref 135–145)

## 2016-11-01 LAB — URINALYSIS, MICROSCOPIC (REFLEX)

## 2016-11-01 LAB — URINALYSIS, ROUTINE W REFLEX MICROSCOPIC

## 2016-11-01 MED ORDER — METOPROLOL TARTRATE 5 MG/5ML IV SOLN
2.5000 mg | Freq: Once | INTRAVENOUS | Status: AC
  Administered 2016-11-01: 2.5 mg via INTRAVENOUS
  Filled 2016-11-01: qty 5

## 2016-11-01 MED ORDER — METOPROLOL TARTRATE 25 MG PO TABS: 25.0000 mg | ORAL_TABLET | Freq: Two times a day (BID) | ORAL | 0 refills | Status: DC

## 2016-11-01 MED ORDER — DEXTROSE 5 % IV SOLN
1.0000 g | Freq: Once | INTRAVENOUS | Status: AC
  Administered 2016-11-01: 1 g via INTRAVENOUS
  Filled 2016-11-01: qty 10

## 2016-11-01 MED ORDER — CEPHALEXIN 500 MG PO CAPS: 500.0000 mg | ORAL_CAPSULE | Freq: Three times a day (TID) | ORAL | 0 refills | Status: DC

## 2016-11-01 NOTE — Addendum Note (Signed)
Addended by: Merlene Laughter on: 11/01/2016 11:46 AM   Modules accepted: Orders

## 2016-11-01 NOTE — Discharge Instructions (Signed)
Drink plenty of fluids. Return if you have any trouble passing your urine, or if your start running a fever.  Increase your metoprolol (Lopressor) to a whole tablet (25 mg) twice a day.Marland Kitchen

## 2016-11-01 NOTE — ED Triage Notes (Signed)
Pt started cipro yesterday per call in from her doctor.  Tonight, pt urinated and noticed blood in urine.  Pt states afterward, she started hurting more and is now also having lower back pain

## 2016-11-01 NOTE — ED Provider Notes (Addendum)
[facility] DEPT Provider Note   CSN: [id number] Arrival date & time: 11/01/16  0119     History   Chief Complaint Chief Complaint  Patient presents with  . Dysuria    HPI Bailey Bishop is a 76 y.o. female.  The history is provided by the patient.  She was diagnosed with atrial fibrillation about 5 weeks ago, and was admitted to the hospital discharged with prescription for warfarin along with diltiazem and metoprolol. For the last 3 days, she has been having hematuria, and passing some clots. There has been no urinary urgency, frequency, urgency. She called her cardiologist to started her on ciprofloxacin, and advised her to stop taking warfarin until she completed her course of ciprofloxacin. She has been taking that for 2 days. Tonight, she had some difficulty getting her urinary stream started. However, she was able to urinate here and did note some clots in her urine. She denies chest pain or dyspnea. She is not aware of any tachycardia. She denies fever or chills. She denies nausea or vomiting.  Past Medical History:  Diagnosis Date  . Anxiety   . Arthritis   . Asthma   . Cancer [facility]) 2001   right breast  . Dyspnea   . GERD (gastroesophageal reflux disease)   . Hypertension     Patient Active Problem List   Diagnosis Date Noted  . Anticoagulated 10/03/2016  . COPD (chronic obstructive pulmonary disease) ([place]) 10/03/2016  . Hypokalemia 09/24/2016  . Asthma in adult without complication 33/54/5625  . Acute CHF ([place]) 09/24/2016  . Atrial fibrillation with RVR ([place]) 09/23/2016  . CKD (chronic kidney disease), stage II 09/23/2016  . Hyperglycemia 09/23/2016  . Anxiety   . GERD (gastroesophageal reflux disease)   . Shortness of breath   . Essential hypertension   . Status asthmaticus 07/07/2014    Past Surgical History:  Procedure Laterality Date  . CATARACT EXTRACTION W/PHACO  07/23/2011   Procedure: CATARACT EXTRACTION PHACO AND INTRAOCULAR LENS PLACEMENT  (IOC);  Surgeon: Williams Che, MD;  Location: [facility] ORS;  Service: Ophthalmology;  Laterality: Right;  CDE:9.96  . CHOLECYSTECTOMY N/A 02/14/2015   Procedure: LAPAROSCOPIC CHOLECYSTECTOMY;  Surgeon: Aviva Signs, MD;  Location: [facility] ORS;  Service: General;  Laterality: N/A;  . MASTECTOMY     bilateral mastectomy-right breast cancer-left taken by choice  . SEPTOPLASTY  1995    OB History    No data available       Home Medications    Prior to Admission medications   Medication Sig Start Date End Date Taking? Authorizing Provider  acetaminophen (TYLENOL) 650 MG CR tablet Take 650 mg by mouth at bedtime.    [provider]  albuterol (PROVENTIL HFA;VENTOLIN HFA) 108 (90 BASE) MCG/ACT inhaler Inhale 1-2 puffs into the lungs every 6 (six) hours as needed for wheezing or shortness of breath.    [provider]  ALPRAZolam Duanne Moron) 0.5 MG tablet Take 0.5 mg by mouth 4 (four) times daily as needed. FOR SHORTNESS OF BREATH 06/18/14   [provider]  BREO ELLIPTA 100-25 MCG/INH AEPB Inhale 1 puff into the lungs daily as needed.  01/31/15   [provider]  ciprofloxacin (CIPRO) 250 MG tablet Take 1 tablet (250 mg total) by mouth 2 (two) times daily. 10/30/16   Herminio Commons, MD  diltiazem (CARDIZEM CD) 240 MG 24 hr capsule Take 1 capsule (240 mg total) by mouth daily. 09/27/16   Orson Eva, MD  enoxaparin (LOVENOX) 100 MG/ML  injection Inject 1 mL (100 mg total) into the skin daily. 09/26/16   Orson Eva, MD  fluticasone (FLONASE) 50 MCG/ACT nasal spray Place 2 sprays into both nostrils daily. Allergies 07/09/14   Kelvin Cellar, MD  furosemide (LASIX) 40 MG tablet Take 1 tablet (40 mg total) by mouth daily. 10/20/16   Almyra Deforest, PA  Glycerin-Hypromellose-PEG 400 (VISINE TEARS OP) Apply 1 drop to eye daily as needed. Watery Eyes    [provider]  ipratropium-albuterol (DUONEB) 0.5-2.5 (3) MG/3ML SOLN Take 3 mLs by nebulization every 4 (four) hours as  needed.    [provider]  metoprolol tartrate (LOPRESSOR) 25 MG tablet Take 0.5 tablets (12.5 mg total) by mouth 2 (two) times daily. 10/03/16 01/01/17  Erlene Quan, PA-C  omeprazole (PRILOSEC) 20 MG capsule Take 20 mg by mouth daily as needed.  02/14/16   [provider]  potassium chloride SA (K-DUR,KLOR-CON) 20 MEQ tablet Take 1 tablet (20 mEq total) by mouth once. 10/25/16 10/25/16  Lendon Colonel, NP  warfarin (COUMADIN) 5 MG tablet Take 1 tablet (5 mg total) by mouth once. 10/25/16 10/25/16  Erlene Quan, PA-C    Family History Family History  Problem Relation Age of Onset  . Cancer Mother   . Aneurysm Father   . Cancer Sister   . Cancer Sister   . Anesthesia problems Neg Hx   . Hypotension Neg Hx   . Malignant hyperthermia Neg Hx   . Pseudochol deficiency Neg Hx     Social History Social History  Substance Use Topics  . Smoking status: Former Smoker    Packs/day: 1.00    Years: 28.00    Types: Cigarettes    Quit date: 07/17/1983  . Smokeless tobacco: Never Used  . Alcohol use No     Allergies   Sulfa antibiotics and Chocolate   Review of Systems Review of Systems  All other systems reviewed and are negative.    Physical Exam Updated Vital Signs BP (!) 153/84 (BP Location: Left Arm)   Pulse (!) 129   Temp 98.1 F (36.7 C) (Oral)   Resp 20   Ht 5\' 4"  (1.626 m)   Wt 65.8 kg (145 lb)   SpO2 98%   BMI 24.89 kg/m   Physical Exam  Nursing note and vitals reviewed.  76 year old female, resting comfortably and in no acute distress. Vital signs are significant for tachycardia and hypertension. Oxygen saturation is 98%, which is normal. Head is normocephalic and atraumatic. PERRLA, EOMI. Oropharynx is clear. Neck is nontender and supple without adenopathy or JVD. Back is nontender and there is no CVA tenderness. Lungs are clear without rales, wheezes, or rhonchi. Chest is nontender. Heart is tachycardic and irregular without  murmur. Abdomen is soft, flat, nontender without masses or hepatosplenomegaly and peristalsis is normoactive. Extremities have no cyanosis or edema, full range of motion is present. Skin is warm and dry without rash. Neurologic: Mental status is normal, cranial nerves are intact, there are no motor or sensory deficits.  ED Treatments / Results  Labs (all labs ordered are listed, but only abnormal results are displayed) Labs Reviewed  URINALYSIS, ROUTINE W REFLEX MICROSCOPIC - Abnormal; Notable for the following:       Result Value   Color, Urine RED (*)    APPearance CLOUDY (*)    Glucose, UA   (*)    Value: TEST NOT REPORTED DUE TO COLOR INTERFERENCE OF URINE PIGMENT   Hgb urine  dipstick   (*)    Value: TEST NOT REPORTED DUE TO COLOR INTERFERENCE OF URINE PIGMENT   Bilirubin Urine   (*)    Value: TEST NOT REPORTED DUE TO COLOR INTERFERENCE OF URINE PIGMENT   Ketones, ur   (*)    Value: TEST NOT REPORTED DUE TO COLOR INTERFERENCE OF URINE PIGMENT   Protein, ur   (*)    Value: TEST NOT REPORTED DUE TO COLOR INTERFERENCE OF URINE PIGMENT   Nitrite   (*)    Value: TEST NOT REPORTED DUE TO COLOR INTERFERENCE OF URINE PIGMENT   Leukocytes, UA   (*)    Value: TEST NOT REPORTED DUE TO COLOR INTERFERENCE OF URINE PIGMENT   All other components within normal limits  PROTIME-INR - Abnormal; Notable for the following:    Prothrombin Time 20.5 (*)    All other components within normal limits  URINALYSIS, MICROSCOPIC (REFLEX) - Abnormal; Notable for the following:    Bacteria, UA FEW (*)    Squamous Epithelial / LPF 0-5 (*)    All other components within normal limits  BASIC METABOLIC PANEL - Abnormal; Notable for the following:    Glucose, Bld 113 (*)    Creatinine, Ser 1.30 (*)    GFR calc non Af Amer 38 (*)    GFR calc Af Amer 44 (*)    All other components within normal limits  CBC WITH DIFFERENTIAL/PLATELET - Abnormal; Notable for the following:    Hemoglobin 11.7 (*)    HCT 35.3  (*)    All other components within normal limits  URINE CULTURE    EKG  EKG Interpretation  Date/Time:  Thursday November 01 2016 03:41:51 EDT Ventricular Rate:  124 PR Interval:    QRS Duration: 87 QT Interval:  348 QTC Calculation: 500 R Axis:   -9 Text Interpretation:  Atrial fibrillation Borderline low voltage, extremity leads Borderline ST depression, anterolateral leads Borderline prolonged QT interval When compared with ECG of 09/23/2016, QT has lengthened Confirmed by Delora Fuel ([id number]) on 11/01/2016 3:46:08 AM       Radiology Ct Renal Stone Study  Result Date: 11/01/2016 CLINICAL DATA:  Acute onset of hematuria and pelvic pain. Lower back pain. EXAM: CT ABDOMEN AND PELVIS WITHOUT CONTRAST TECHNIQUE: Multidetector CT imaging of the abdomen and pelvis was performed following the standard protocol without IV contrast. COMPARISON:  CT the abdomen and pelvis, and right upper quadrant ultrasound, performed 02/09/2015 FINDINGS: Lower chest: A small right pleural effusion is noted. The heart is mildly enlarged. Hepatobiliary: A calcified granuloma is noted at the right hepatic lobe. The patient is status post cholecystectomy, with clips noted at the gallbladder fossa. The common bile duct is grossly unremarkable, status post cholecystectomy. Pancreas: The pancreas is within normal limits. Spleen: The spleen is unremarkable in appearance. Adrenals/Urinary Tract: The adrenal glands are unremarkable in appearance. Minimal right-sided hydronephrosis is noted, with prominence of the right ureter. No distal obstructing stone is seen. Wall thickening is noted along the course of the right ureter, likely reflecting ureteritis. Nonspecific perinephric stranding is noted bilaterally. Underlying pyelonephritis cannot be excluded. No nonobstructing renal stones are identified. A left renal cyst is seen. Stomach/Bowel: The stomach is unremarkable in appearance. The small bowel is within normal limits. The  appendix is normal in caliber, without evidence of appendicitis. Scattered diverticulosis is noted along the sigmoid colon, without evidence of diverticulitis. Vascular/Lymphatic: Scattered calcification is seen along the abdominal aorta and its branches. The abdominal aorta is otherwise  grossly unremarkable. The inferior vena cava is grossly unremarkable. No retroperitoneal lymphadenopathy is seen. No pelvic sidewall lymphadenopathy is identified. Reproductive: The bladder is mildly distended and grossly unremarkable. The uterus is grossly unremarkable in appearance. The ovaries are relatively symmetric. No suspicious adnexal masses are seen. Other: No additional soft tissue abnormalities are seen. Musculoskeletal: No acute osseous abnormalities are identified. Vacuum phenomenon is noted at L5-S1. The visualized musculature is unremarkable in appearance. IMPRESSION: 1. Wall thickening along the course of the right ureter, likely reflecting ureteritis. Nonspecific perinephric stranding noted bilaterally. Underlying pyelonephritis cannot be excluded. 2. Minimal right-sided hydronephrosis, without evidence of a distal obstructing stone. This may reflect the underlying ureteritis. 3. Scattered diverticulosis along the sigmoid colon, without evidence of diverticulitis. 4. Scattered aortic atherosclerosis. 5. Small right pleural effusion. 6. Mild cardiomegaly. Electronically Signed   By: Garald Balding M.D.   On: 11/01/2016 03:46    Procedures Procedures (including critical care time) CRITICAL CARE Performed by: AOZHY,QMVHQ Total critical care time: 35 minutes Critical care time was exclusive of separately billable procedures and treating other patients. Critical care was necessary to treat or prevent imminent or life-threatening deterioration. Critical care was time spent personally by me on the following activities: development of treatment plan with patient and/or surrogate as well as nursing, discussions  with consultants, evaluation of patient's response to treatment, examination of patient, obtaining history from patient or surrogate, ordering and performing treatments and interventions, ordering and review of laboratory studies, ordering and review of radiographic studies, pulse oximetry and re-evaluation of patient's condition.  Medications Ordered in ED Medications  metoprolol tartrate (LOPRESSOR) injection 2.5 mg (2.5 mg Intravenous Given 11/01/16 0415)  cefTRIAXone (ROCEPHIN) 1 g in dextrose 5 % 50 mL IVPB (0 g Intravenous Stopped 11/01/16 0459)     Initial Impression / Assessment and Plan / ED Course  I have reviewed the triage vital signs and the nursing notes.  Pertinent labs & imaging results that were available during my care of the patient were reviewed by me and considered in my medical decision making (see chart for details).  Hematuria and patient anticoagulated on warfarin. Old records are reviewed, and INR on August 6 was slightly elevated at 3.09. Tonight, INR is slightly subtherapeutic at 1.74. Urinalysis on August 6 did not show clear signs of infection. There is positive nitrite, but this is felt to be due to color interference from urine which was read. There was mild pyuria with 6-30 WBCs, but this is in the setting oftoo2 numerous to count RBCs and squamous epithelial cells present. Tonight, urine shows 0-5 WBCs. However, she has a tachycardic and irregular heart rhythm. Will check ECG to confirm atrial fibrillation. Will also check CBC and metabolic panel and obtain renal stone protocol CT scan.  CT scan shows suggestion of pyelonephritis. Heart rate has come down with metoprolol and she will be sent home with a higher dose of metoprolol to try to keep appropriate heart rate. In light of CT findings, she is given a dose of ceftriaxone and is given a prescription for cephalexin. Urine has been sent for culture, but, given the fact that she is on ciprofloxacin, false-negative is  possible. Remainder of laboratory work is unremarkable. Mild anemia is present and mild renal insufficiency is present, both unchanged from baseline. She is referred to urology for evaluation of her hematuria, and referred back to cardiology for management of her atrial fibrillation with rapid ventricular response. Return precautions discussed.  CHA2DS2/VAS Stroke Risk Points  5 >= 2 Points: High Risk  1 - 1.99 Points: Medium Risk  0 Points: Low Risk    The previous score was 4 on 09/23/2016.:  Change:         Details    Note: External data might be a factor in metrics not marked with    Points Metrics   This score determines the patient's risk of having a stroke if the  patient has atrial fibrillation.       1 Has Congestive Heart Failure:  Yes   0 Has Vascular Disease:  No   1 Has Hypertension:  Yes   2 Age:  22   0 Has Diabetes:  No   0 Had Stroke:  No Had TIA:  No Had thromboembolism:  No   1 Female:  Yes         Final Clinical Impressions(s) / ED Diagnoses   Final diagnoses:  Gross hematuria  Urinary tract infection with hematuria, site unspecified  Renal insufficiency  Normochromic normocytic anemia  Persistent atrial fibrillation with rapid ventricular response (HCC)    New Prescriptions New Prescriptions   CEPHALEXIN (KEFLEX) 500 MG CAPSULE    Take 1 capsule (500 mg total) by mouth 3 (three) times daily.     Delora Fuel, MD 36/12/24 [id number]    Delora Fuel, MD 30/05/11 [phone]

## 2016-11-02 LAB — URINE CULTURE: Culture: NO GROWTH

## 2016-11-05 ENCOUNTER — Ambulatory Visit (INDEPENDENT_AMBULATORY_CARE_PROVIDER_SITE_OTHER): Payer: Medicare Other | Admitting: Cardiovascular Disease

## 2016-11-05 ENCOUNTER — Encounter: Payer: Self-pay | Admitting: Cardiovascular Disease

## 2016-11-05 ENCOUNTER — Telehealth: Payer: Self-pay | Admitting: *Deleted

## 2016-11-05 ENCOUNTER — Other Ambulatory Visit: Payer: Self-pay | Admitting: Cardiovascular Disease

## 2016-11-05 VITALS — BP 160/78 | HR 105 | Ht 64.0 in | Wt 155.0 lb

## 2016-11-05 DIAGNOSIS — I10 Essential (primary) hypertension: Secondary | ICD-10-CM | POA: Diagnosis not present

## 2016-11-05 DIAGNOSIS — I4891 Unspecified atrial fibrillation: Secondary | ICD-10-CM | POA: Diagnosis not present

## 2016-11-05 DIAGNOSIS — Z9289 Personal history of other medical treatment: Secondary | ICD-10-CM | POA: Diagnosis not present

## 2016-11-05 DIAGNOSIS — R319 Hematuria, unspecified: Secondary | ICD-10-CM

## 2016-11-05 DIAGNOSIS — Z79899 Other long term (current) drug therapy: Secondary | ICD-10-CM | POA: Diagnosis not present

## 2016-11-05 DIAGNOSIS — I5032 Chronic diastolic (congestive) heart failure: Secondary | ICD-10-CM

## 2016-11-05 MED ORDER — DILTIAZEM HCL ER COATED BEADS 360 MG PO CP24: 360.0000 mg | ORAL_CAPSULE | Freq: Every day | ORAL | 6 refills | Status: DC

## 2016-11-05 MED ORDER — WARFARIN SODIUM 5 MG PO TABS: 5.0000 mg | ORAL_TABLET | Freq: Every day | ORAL | 2 refills | Status: DC

## 2016-11-05 MED ORDER — WARFARIN SODIUM 5 MG PO TABS
5.0000 mg | ORAL_TABLET | Freq: Every day | ORAL | 2 refills | Status: DC
Start: 2016-11-05 — End: 2017-01-15

## 2016-11-05 NOTE — Progress Notes (Signed)
SUBJECTIVE: The patient presents for follow-up of new onset atrial fibrillation with RVR. Echocardiogram demonstrated normal left ventricular systolic function and severe left atrial enlargement. She is anticoagulated with warfarin. She also had diastolic heart failure while hospitalized when I evaluated her on 09/25/16 due to rapid atrial fibrillation.  She was evaluated in the ED on 11/01/16. CT showed suggestions of pyelonephritis. She also had rapid atrial fibrillation and metoprolol dose was increased. She was started on Keflex.  ECG showed rapid atrial fibrillation, 124 bpm.  INR 3.09 on 10/29/16. 1.74 on 11/01/16.  Hemoglobin 11.7, creatinine 1.3.  Warfarin has been on hold due to hematuria.  She no longer has dysuria or hematuria. She denies chest pain, palpitations, and shortness of breath. She has no history of recurrent UTIs.   Review of Systems: As per "subjective", otherwise negative.  Allergies  Allergen Reactions  . Sulfa Antibiotics Nausea And Vomiting  . Chocolate Rash    Current Outpatient Prescriptions  Medication Sig Dispense Refill  . acetaminophen (TYLENOL) 650 MG CR tablet Take 650 mg by mouth at bedtime.    Marland Kitchen albuterol (PROVENTIL HFA;VENTOLIN HFA) 108 (90 BASE) MCG/ACT inhaler Inhale 1-2 puffs into the lungs every 6 (six) hours as needed for wheezing or shortness of breath.    . ALPRAZolam (XANAX) 0.5 MG tablet Take 0.5 mg by mouth 4 (four) times daily as needed. FOR SHORTNESS OF BREATH    . BREO ELLIPTA 100-25 MCG/INH AEPB Inhale 1 puff into the lungs daily as needed.     . cephALEXin (KEFLEX) 500 MG capsule Take 1 capsule (500 mg total) by mouth 3 (three) times daily. 30 capsule 0  . diltiazem (CARDIZEM CD) 240 MG 24 hr capsule Take 1 capsule (240 mg total) by mouth daily. 30 capsule 1  . fluticasone (FLONASE) 50 MCG/ACT nasal spray Place 2 sprays into both nostrils daily. Allergies 1 g 2  . furosemide (LASIX) 40 MG tablet Take 1 tablet (40 mg total) by  mouth daily. 30 tablet 3  . Glycerin-Hypromellose-PEG 400 (VISINE TEARS OP) Apply 1 drop to eye daily as needed. Watery Eyes    . ipratropium-albuterol (DUONEB) 0.5-2.5 (3) MG/3ML SOLN Take 3 mLs by nebulization every 4 (four) hours as needed.    . metoprolol tartrate (LOPRESSOR) 25 MG tablet Take 1 tablet (25 mg total) by mouth 2 (two) times daily. 60 tablet 0  . omeprazole (PRILOSEC) 20 MG capsule Take 20 mg by mouth daily as needed.     . potassium chloride SA (K-DUR,KLOR-CON) 20 MEQ tablet Take 1 tablet (20 mEq total) by mouth once. 90 tablet 3   No current facility-administered medications for this visit.     Past Medical History:  Diagnosis Date  . Anxiety   . Arthritis   . Asthma   . Cancer [facility]) 2001   right breast  . Dyspnea   . GERD (gastroesophageal reflux disease)   . Hypertension     Past Surgical History:  Procedure Laterality Date  . CATARACT EXTRACTION W/PHACO  07/23/2011   Procedure: CATARACT EXTRACTION PHACO AND INTRAOCULAR LENS PLACEMENT (IOC);  Surgeon: Williams Che, MD;  Location: [facility];  Service: Ophthalmology;  Laterality: Right;  CDE:9.96  . CHOLECYSTECTOMY N/A 02/14/2015   Procedure: LAPAROSCOPIC CHOLECYSTECTOMY;  Surgeon: Aviva Signs, MD;  Location: [facility] ORS;  Service: General;  Laterality: N/A;  . MASTECTOMY     bilateral mastectomy-right breast cancer-left taken by choice  . SEPTOPLASTY  1995    Social History  Social History  . Marital status: Married    Spouse name: N/A  . Number of children: N/A  . Years of education: N/A   Occupational History  . Not on file.   Social History Main Topics  . Smoking status: Former Smoker    Packs/day: 1.00    Years: 28.00    Types: Cigarettes    Quit date: 07/17/1983  . Smokeless tobacco: Never Used  . Alcohol use No  . Drug use: No  . Sexual activity: No   Other Topics Concern  . Not on file   Social History Narrative  . No narrative on file     Vitals:   11/05/16 0829  BP: (!) 160/78   Pulse: (!) 105  SpO2: 98%  Weight: 155 lb (70.3 kg)  Height: 5\' 4"  (1.626 m)    Wt Readings from Last 3 Encounters:  11/05/16 155 lb (70.3 kg)  11/01/16 145 lb (65.8 kg)  10/03/16 153 lb (69.4 kg)     PHYSICAL EXAM General: NAD HEENT: Normal. Neck: No JVD, no thyromegaly. Lungs: Faint expiratory wheezes, no crackles. CV: Tachycardic, irregular rhythm, normal S1/S2, no S3, no murmur. No pretibial or periankle edema.  Abdomen: Soft, nontender, no distention.  Neurologic: Alert and oriented.  Psych: Normal affect. Skin: Normal. Musculoskeletal: No gross deformities.    ECG: Most recent ECG reviewed.   Labs: Lab Results  Component Value Date/Time   K 3.6 11/01/2016 02:29 AM   BUN 15 11/01/2016 02:29 AM   BUN 16 10/24/2016 08:06 AM   CREATININE 1.30 (H) 11/01/2016 02:29 AM   ALT 28 09/23/2016 01:44 PM   TSH 1.811 09/26/2016 05:58 AM   HGB 11.7 (L) 11/01/2016 02:29 AM     Lipids: Lab Results  Component Value Date/Time   LDLCALC 62 09/24/2016 04:38 AM   CHOL 109 09/24/2016 04:38 AM   TRIG 66 09/24/2016 04:38 AM   HDL 34 (L) 09/24/2016 04:38 AM       ASSESSMENT AND PLAN: 1. Rapid atrial fibrillation: Currently on long-acting diltiazem 240 mg daily and metoprolol 25 mg twice daily. She is hypertensive. Warfarin has been on hold. I will increase diltiazem to 360 mg daily. I will enroll her in her anticoagulation clinic and have her evaluated in [place] tomorrow.  2. Hypertension: She is hypertensive. I am increasing long-acting diltiazem to 360 mg daily.  3. Chronic diastolic heart failure: Euvolemic on Lasix 40 mg daily. She is also on supplemental potassium. I will aim to control heart rate and blood pressure.      Disposition: Follow up with me in 1 month in [place].  Time spent: 40 minutes, of which greater than 50% was spent reviewing symptoms, relevant blood tests and studies, and discussing management plan with the patient.    Kate Sable, M.D., F.A.C.C.

## 2016-11-05 NOTE — Telephone Encounter (Signed)
-----   Message from Herminio Commons, MD sent at 11/01/2016 11:36 AM EDT ----- Delila Spence, She should be getting her INR checked this Friday. I communicated this to one of our nurses yesterday, actually. Will arrange for follow up.  Jamesetta So  ----- Message ----- From: Delora Fuel, MD Sent: 11/01/2016   6:03 AM To: Erlene Quan, PA-C, Herminio Commons, MD  Ms. Wrightsman came to the ED with gross hematuria, found to have uncontrolled a-fib. Heart rate came down with metoprolol, sent home on increased dose of metoprolol. She is concerned that she had not been set up on a schedule for her INR's. Can you make sure she has her INR's scheduled, and that she gets in for office follow-up?  Thank you  Delora Fuel

## 2016-11-05 NOTE — Telephone Encounter (Signed)
Patient called stating that she needs refill on Coumdin  [place].

## 2016-11-05 NOTE — Addendum Note (Signed)
Addended by: Laurine Blazer on: 11/05/2016 04:59 PM   Modules accepted: Orders

## 2016-11-05 NOTE — Patient Instructions (Signed)
Medication Instructions:   Increase Cardizem CD to 360mg  daily.  Continue all other medications.    Labwork: none  Testing/Procedures: none  Follow-Up: 1 month - [place] office   Any Other Special Instructions Will Be Listed Below (If Applicable). Enroll in Coumadin clinic - first visit tomorrow.    If you need a refill on your cardiac medications before your next appointment, please call your pharmacy.

## 2016-11-05 NOTE — Telephone Encounter (Signed)
Contacted patient to inquire about INR that was to be done on Friday, 11/02/16. Patient said she did not have this done because she was told not to do so by the ED doctor.   Patient advised that she still needed this to be completed. Patient said she could do it today but she had an office visit in [place] at 8:20 am.   Message sent to doctor as an [place].

## 2016-11-05 NOTE — Telephone Encounter (Signed)
Per [name] - refill 5mg  today.  Refill sent to [facility].

## 2016-11-06 ENCOUNTER — Ambulatory Visit (INDEPENDENT_AMBULATORY_CARE_PROVIDER_SITE_OTHER): Payer: Medicare Other | Admitting: *Deleted

## 2016-11-06 DIAGNOSIS — I4891 Unspecified atrial fibrillation: Secondary | ICD-10-CM | POA: Diagnosis not present

## 2016-11-06 DIAGNOSIS — Z7901 Long term (current) use of anticoagulants: Secondary | ICD-10-CM

## 2016-11-06 LAB — POCT INR: INR: 1.3

## 2016-11-07 ENCOUNTER — Telehealth: Payer: Self-pay | Admitting: *Deleted

## 2016-11-07 ENCOUNTER — Telehealth: Payer: Self-pay | Admitting: Cardiovascular Disease

## 2016-11-07 NOTE — Telephone Encounter (Signed)
-----   Message from Herminio Commons, MD sent at 11/06/2016  4:19 PM EDT ----- Cipro x 3 days is sufficient treatment for uncomplicated UTI. If she is not having symptoms, there is no indication to treat again. Secondly, she would need a repeat urinalysis only if she is having symptoms.  ----- Message ----- From: Malen Gauze, RN Sent: 11/06/2016   4:10 PM To: Herminio Commons, MD, Laurine Blazer, LPN  Saw pt in coumadin clinic today.  She was given Rx for Keflex in the ED on 8/9 for questionable UTI.  She took it x 3 days but it makes her sick (N/V) so she stopped taking it.  She wants to know if you will call her in something else for a couple of days.  States she wants everything cleared up so she doesn't have bleeding again.  You gave her 3 days of Cipro before she started Keflex.  Wants it sent to [facility] if you agree.  She is suppose to see Urology but the first available appt is November.

## 2016-11-07 NOTE — Telephone Encounter (Addendum)
Patient notified and verbalized understanding.  Stated she is doing okay now.

## 2016-11-07 NOTE — Telephone Encounter (Signed)
Pt is calling about the antibiotic, she hasn't heard anything from the pharmacy.

## 2016-11-07 NOTE — Telephone Encounter (Signed)
Spoke with patient.  Bailey Bishop already spoke with pt and informed Bailey Bishop Dr Raliegh Ip does not think she needs any more antibiotics.  Pt is in agreement.

## 2016-11-07 NOTE — Telephone Encounter (Signed)
Left message to return call 

## 2016-11-07 NOTE — Telephone Encounter (Signed)
Already addressed

## 2016-11-09 ENCOUNTER — Ambulatory Visit: Payer: Medicare Other | Admitting: Adult Health

## 2016-11-12 ENCOUNTER — Ambulatory Visit (INDEPENDENT_AMBULATORY_CARE_PROVIDER_SITE_OTHER): Payer: Medicare Other | Admitting: *Deleted

## 2016-11-12 DIAGNOSIS — I4891 Unspecified atrial fibrillation: Secondary | ICD-10-CM | POA: Diagnosis not present

## 2016-11-12 DIAGNOSIS — Z7901 Long term (current) use of anticoagulants: Secondary | ICD-10-CM

## 2016-11-12 LAB — POCT INR: INR: 2.1

## 2016-11-19 ENCOUNTER — Ambulatory Visit (INDEPENDENT_AMBULATORY_CARE_PROVIDER_SITE_OTHER): Payer: Medicare Other | Admitting: *Deleted

## 2016-11-19 DIAGNOSIS — Z7901 Long term (current) use of anticoagulants: Secondary | ICD-10-CM | POA: Diagnosis not present

## 2016-11-19 DIAGNOSIS — I4891 Unspecified atrial fibrillation: Secondary | ICD-10-CM

## 2016-11-19 LAB — POCT INR: INR: 3.6

## 2016-11-27 ENCOUNTER — Telehealth: Payer: Self-pay | Admitting: Cardiovascular Disease

## 2016-11-27 NOTE — Telephone Encounter (Signed)
Pt lvm stating she's having extreme diarrhea since starting the new medication [name] put her on, please give her a call

## 2016-11-28 ENCOUNTER — Ambulatory Visit (INDEPENDENT_AMBULATORY_CARE_PROVIDER_SITE_OTHER): Payer: Medicare Other | Admitting: *Deleted

## 2016-11-28 DIAGNOSIS — Z5181 Encounter for therapeutic drug level monitoring: Secondary | ICD-10-CM | POA: Diagnosis not present

## 2016-11-28 DIAGNOSIS — I4891 Unspecified atrial fibrillation: Secondary | ICD-10-CM | POA: Diagnosis not present

## 2016-11-28 DIAGNOSIS — Z7901 Long term (current) use of anticoagulants: Secondary | ICD-10-CM | POA: Diagnosis not present

## 2016-11-28 LAB — POCT INR: INR: 3

## 2016-11-28 NOTE — Telephone Encounter (Signed)
Pt returning Gayle's call - please call cell noted in contacts

## 2016-11-28 NOTE — Telephone Encounter (Signed)
Left message to return call 

## 2016-11-29 MED ORDER — DILTIAZEM HCL ER COATED BEADS 240 MG PO CP24: 240.0000 mg | ORAL_CAPSULE | Freq: Every day | ORAL | Status: DC

## 2016-11-29 NOTE — Telephone Encounter (Signed)
Increased Cardizem CD to 360mg  on 11/05/2016.  Had been on this medication x several weeks & started noticing change in bowel habits.  Progressed to diarrhea & was gradually getting worse.  Stated that she went back to the 240mg  daily dose. Stated that dose is working just as good as the 360mg .  Has been keeping up with heart rates.  With the 360mg , heart rates running anywhere from 48-78/80.  Felt really weak and drug down on this dose.  With the 240mg , heart rates running 60-75 & is feeling a lot better.  Diarrhea is completley gone now.  Patient already has follow up scheduled for 12/06/2016 in the [place] office.  Please advise if okay to remain on this dose.

## 2016-11-29 NOTE — Telephone Encounter (Signed)
That would be fine 

## 2016-11-29 NOTE — Telephone Encounter (Signed)
Patient notified and verbalized understanding. 

## 2016-12-06 ENCOUNTER — Ambulatory Visit (INDEPENDENT_AMBULATORY_CARE_PROVIDER_SITE_OTHER): Payer: Medicare Other | Admitting: Cardiovascular Disease

## 2016-12-06 ENCOUNTER — Encounter: Payer: Self-pay | Admitting: Cardiovascular Disease

## 2016-12-06 VITALS — BP 150/86 | HR 108 | Ht 64.0 in | Wt 150.0 lb

## 2016-12-06 DIAGNOSIS — I4891 Unspecified atrial fibrillation: Secondary | ICD-10-CM | POA: Diagnosis not present

## 2016-12-06 DIAGNOSIS — I10 Essential (primary) hypertension: Secondary | ICD-10-CM

## 2016-12-06 DIAGNOSIS — I5032 Chronic diastolic (congestive) heart failure: Secondary | ICD-10-CM

## 2016-12-06 DIAGNOSIS — Z7901 Long term (current) use of anticoagulants: Secondary | ICD-10-CM | POA: Diagnosis not present

## 2016-12-06 DIAGNOSIS — T887XXA Unspecified adverse effect of drug or medicament, initial encounter: Secondary | ICD-10-CM | POA: Diagnosis not present

## 2016-12-06 MED ORDER — METOPROLOL TARTRATE 50 MG PO TABS: 50.0000 mg | ORAL_TABLET | Freq: Two times a day (BID) | ORAL | 3 refills | Status: DC

## 2016-12-06 NOTE — Patient Instructions (Signed)
Your physician recommends that you schedule a follow-up appointment in: 1 month with Amie Portland PA-C    STOP Diltiazem    INCREASE Lopressor to 50 mg twice a day     No testing ordered today.      Thank you for choosing [facility] !

## 2016-12-06 NOTE — Progress Notes (Deleted)
    [name], female    DOB: [birth year], 76 y.o.   MRN: [id number]  No chief complaint on file.   Allergies Sulfa antibiotics and Chocolate  Subjective:   Bailey Bishop is a 76 y.o. female who presents to [facility] today.  HPI HPI  Past Medical History:  Diagnosis Date  . Anxiety   . Arthritis   . Asthma   . Cancer [facility]) 2001   right breast  . Dyspnea   . GERD (gastroesophageal reflux disease)   . Hypertension     Past Surgical History:  Procedure Laterality Date  . CATARACT EXTRACTION W/PHACO  07/23/2011   Procedure: CATARACT EXTRACTION PHACO AND INTRAOCULAR LENS PLACEMENT (IOC);  Surgeon: Williams Che, MD;  Location: [facility] ORS;  Service: Ophthalmology;  Laterality: Right;  CDE:9.96  . CHOLECYSTECTOMY N/A 02/14/2015   Procedure: LAPAROSCOPIC CHOLECYSTECTOMY;  Surgeon: Aviva Signs, MD;  Location: [facility] ORS;  Service: General;  Laterality: N/A;  . MASTECTOMY     bilateral mastectomy-right breast cancer-left taken by choice  . SEPTOPLASTY  1995    Family History  Problem Relation Age of Onset  . Cancer Mother   . Aneurysm Father   . Cancer Sister   . Cancer Sister   . Anesthesia problems Neg Hx   . Hypotension Neg Hx   . Malignant hyperthermia Neg Hx   . Pseudochol deficiency Neg Hx      Social History   Social History  . Marital status: Married    Spouse name: N/A  . Number of children: N/A  . Years of education: N/A   Social History Main Topics  . Smoking status: Former Smoker    Packs/day: 1.00    Years: 28.00    Types: Cigarettes    Quit date: 07/17/1983  . Smokeless tobacco: Never Used  . Alcohol use No  . Drug use: No  . Sexual activity: No   Other Topics Concern  . Not on file   Social History Narrative  . No narrative on file    Review of Systems   Objective:   There were no vitals taken for this visit.  Physical Exam   Assessment and Plan   There are no diagnoses linked to this  encounter.   No Follow-up on file. Amado Coe, [place] 12/06/2016

## 2016-12-06 NOTE — Progress Notes (Signed)
SUBJECTIVE: The patient presents for routine follow-up. I increased long-acting diltiazem to 360 mg on 11/05/16. She then complained of diarrhea and weakness. She reduced the dose back to 240 mg daily and felt well and kept up with her heart rates which were found be normal. Diarrhea then resolved.  She is hypertensive and tachycardic in our office today.  She told me she did not take Cardizem today. She has only been taking metoprolol tartrate 12.5 mg twice daily.  She said even when taking 240 mg of long-acting diltiazem, she had significant diarrhea, abdominal bloating, and belching. She has never had these problems before.  Echocardiogram showed severe left atrial enlargement.  Review of Systems: As per "subjective", otherwise negative.  Allergies  Allergen Reactions  . Sulfa Antibiotics Nausea And Vomiting  . Chocolate Rash    Current Outpatient Prescriptions  Medication Sig Dispense Refill  . acetaminophen (TYLENOL) 650 MG CR tablet Take 650 mg by mouth at bedtime.    Marland Kitchen albuterol (PROVENTIL HFA;VENTOLIN HFA) 108 (90 BASE) MCG/ACT inhaler Inhale 1-2 puffs into the lungs every 6 (six) hours as needed for wheezing or shortness of breath.    . ALPRAZolam (XANAX) 0.5 MG tablet Take 0.5 mg by mouth 4 (four) times daily as needed. FOR SHORTNESS OF BREATH    . BREO ELLIPTA 100-25 MCG/INH AEPB Inhale 1 puff into the lungs daily as needed.     . cephALEXin (KEFLEX) 500 MG capsule Take 1 capsule (500 mg total) by mouth 3 (three) times daily. 30 capsule 0  . diltiazem (CARDIZEM CD) 240 MG 24 hr capsule Take 1 capsule (240 mg total) by mouth daily.    . fluticasone (FLONASE) 50 MCG/ACT nasal spray Place 2 sprays into both nostrils daily. Allergies 1 g 2  . furosemide (LASIX) 40 MG tablet Take 1 tablet (40 mg total) by mouth daily. 30 tablet 3  . Glycerin-Hypromellose-PEG 400 (VISINE TEARS OP) Apply 1 drop to eye daily as needed. Watery Eyes    . ipratropium-albuterol (DUONEB) 0.5-2.5  (3) MG/3ML SOLN Take 3 mLs by nebulization every 4 (four) hours as needed.    . metoprolol tartrate (LOPRESSOR) 25 MG tablet Take 1 tablet (25 mg total) by mouth 2 (two) times daily. 60 tablet 0  . omeprazole (PRILOSEC) 20 MG capsule Take 20 mg by mouth daily as needed.     . potassium chloride SA (K-DUR,KLOR-CON) 20 MEQ tablet Take 1 tablet (20 mEq total) by mouth once. 90 tablet 3  . warfarin (COUMADIN) 5 MG tablet Take 1 tablet (5 mg total) by mouth daily. 30 tablet 2   No current facility-administered medications for this visit.     Past Medical History:  Diagnosis Date  . Anxiety   . Arthritis   . Asthma   . Cancer [facility]) 2001   right breast  . Dyspnea   . GERD (gastroesophageal reflux disease)   . Hypertension     Past Surgical History:  Procedure Laterality Date  . CATARACT EXTRACTION W/PHACO  07/23/2011   Procedure: CATARACT EXTRACTION PHACO AND INTRAOCULAR LENS PLACEMENT (IOC);  Surgeon: Williams Che, MD;  Location: [facility];  Service: Ophthalmology;  Laterality: Right;  CDE:9.96  . CHOLECYSTECTOMY N/A 02/14/2015   Procedure: LAPAROSCOPIC CHOLECYSTECTOMY;  Surgeon: Aviva Signs, MD;  Location: [facility] ORS;  Service: General;  Laterality: N/A;  . MASTECTOMY     bilateral mastectomy-right breast cancer-left taken by choice  . SEPTOPLASTY  1995    Social History  Social History  . Marital status: Married    Spouse name: N/A  . Number of children: N/A  . Years of education: N/A   Occupational History  . Not on file.   Social History Main Topics  . Smoking status: Former Smoker    Packs/day: 1.00    Years: 28.00    Types: Cigarettes    Quit date: 07/17/1983  . Smokeless tobacco: Never Used  . Alcohol use No  . Drug use: No  . Sexual activity: No   Other Topics Concern  . Not on file   Social History Narrative  . No narrative on file     Vitals:   12/06/16 1358  BP: (!) 150/86  Pulse: (!) 108  SpO2: 98%  Weight: 150 lb (68 kg)  Height: 5\' 4"  (1.626 m)     Wt Readings from Last 3 Encounters:  12/06/16 150 lb (68 kg)  11/05/16 155 lb (70.3 kg)  11/01/16 145 lb (65.8 kg)     PHYSICAL EXAM General: NAD HEENT: Normal. Neck: No JVD, no thyromegaly. Lungs: Clear to auscultation bilaterally with normal respiratory effort. CV: Nondisplaced PMI.  Tachycardic, irregular rhythm, normal S1/S2, no S3, no murmur. No pretibial or periankle edema.  Abdomen: Soft, nontender, no distention.  Neurologic: Alert and oriented.  Psych: Normal affect. Skin: Normal. Musculoskeletal: No gross deformities.    ECG: Most recent ECG reviewed.   Labs: Lab Results  Component Value Date/Time   K 3.6 11/01/2016 02:29 AM   BUN 15 11/01/2016 02:29 AM   BUN 16 10/24/2016 08:06 AM   CREATININE 1.30 (H) 11/01/2016 02:29 AM   ALT 28 09/23/2016 01:44 PM   TSH 1.811 09/26/2016 05:58 AM   HGB 11.7 (L) 11/01/2016 02:29 AM     Lipids: Lab Results  Component Value Date/Time   LDLCALC 62 09/24/2016 04:38 AM   CHOL 109 09/24/2016 04:38 AM   TRIG 66 09/24/2016 04:38 AM   HDL 34 (L) 09/24/2016 04:38 AM       ASSESSMENT AND PLAN: 1. Rapid atrial fibrillation: She did not tolerate Cardizem CD. She has only been taking metoprolol 12.5 mg twice daily. I will increase metoprolol tartrate to 50 mg twice daily. As she has severe left atrial enlargement, I think rate control will be the best strategy as she is unlikely to achieve sinus rhythm. Continue warfarin for anticoagulation.  2. Hypertension: She is hypertensive. Monitor blood pressure given increase in metoprolol dose.  3. Chronic diastolic heart failure: Euvolemic on Lasix 40 mg daily. She is also on supplemental potassium. I will aim to control heart rate and blood pressure.      Disposition: Follow up 1 month   Kate Sable, M.D., F.A.C.C.

## 2016-12-07 ENCOUNTER — Ambulatory Visit: Payer: Medicare Other | Admitting: Family Medicine

## 2016-12-10 ENCOUNTER — Ambulatory Visit (INDEPENDENT_AMBULATORY_CARE_PROVIDER_SITE_OTHER): Payer: Medicare Other | Admitting: *Deleted

## 2016-12-10 DIAGNOSIS — I4891 Unspecified atrial fibrillation: Secondary | ICD-10-CM

## 2016-12-10 DIAGNOSIS — Z7901 Long term (current) use of anticoagulants: Secondary | ICD-10-CM

## 2016-12-10 LAB — POCT INR: INR: 3.4

## 2016-12-13 ENCOUNTER — Telehealth: Payer: Self-pay | Admitting: Cardiovascular Disease

## 2016-12-13 DIAGNOSIS — I4891 Unspecified atrial fibrillation: Secondary | ICD-10-CM

## 2016-12-13 DIAGNOSIS — J45909 Unspecified asthma, uncomplicated: Secondary | ICD-10-CM | POA: Diagnosis not present

## 2016-12-13 DIAGNOSIS — H04129 Dry eye syndrome of unspecified lacrimal gland: Secondary | ICD-10-CM | POA: Diagnosis not present

## 2016-12-13 DIAGNOSIS — I482 Chronic atrial fibrillation: Secondary | ICD-10-CM | POA: Diagnosis not present

## 2016-12-13 DIAGNOSIS — J309 Allergic rhinitis, unspecified: Secondary | ICD-10-CM | POA: Diagnosis not present

## 2016-12-13 MED ORDER — DILTIAZEM HCL ER COATED BEADS 240 MG PO CP24: 240.0000 mg | ORAL_CAPSULE | Freq: Every day | ORAL | 0 refills | Status: DC

## 2016-12-13 NOTE — Telephone Encounter (Signed)
Noted  

## 2016-12-13 NOTE — Telephone Encounter (Signed)
Referral placed.

## 2016-12-13 NOTE — Telephone Encounter (Signed)
Patient called stating that she needs to discuss her medication .

## 2016-12-13 NOTE — Telephone Encounter (Signed)
Sent prescription of dilitiazem 240 mg to pharmacy. Called patient to let her know and patient stated she has had diarrhea today so she did not want to take diltiazem anymore either. Patient would like to know if there is anything else she can try

## 2016-12-13 NOTE — Telephone Encounter (Signed)
Left message for patient

## 2016-12-13 NOTE — Telephone Encounter (Signed)
Make referral to a fib clinic.

## 2016-12-13 NOTE — Telephone Encounter (Signed)
Patient called stating the lopressor is not working. Patient states she is not able to breathe good at night and unable to sleep. Patient states she feels very weak and getting out of breath just walking across the yard. Patient has stopped taking the lopressor and wants to start taking Cartia XT 240 mg again.Patient states when she stopped the lopressor, she started the cartia back. Patient cannot remember the exact day she stopped lopressor. BP today was 116/79 HR 71

## 2016-12-18 ENCOUNTER — Ambulatory Visit (HOSPITAL_COMMUNITY)
Admission: RE | Admit: 2016-12-18 | Discharge: 2016-12-18 | Disposition: A | Discharge status: Home / Self Care | Payer: Medicare Other | Source: Ambulatory Visit | Attending: Nurse Practitioner | Admitting: Nurse Practitioner

## 2016-12-18 ENCOUNTER — Encounter (HOSPITAL_COMMUNITY): Payer: Self-pay | Admitting: Nurse Practitioner

## 2016-12-18 VITALS — BP 144/72 | HR 90 | Ht 64.0 in | Wt 151.6 lb

## 2016-12-18 DIAGNOSIS — K219 Gastro-esophageal reflux disease without esophagitis: Secondary | ICD-10-CM | POA: Diagnosis not present

## 2016-12-18 DIAGNOSIS — I10 Essential (primary) hypertension: Secondary | ICD-10-CM | POA: Diagnosis not present

## 2016-12-18 DIAGNOSIS — Z853 Personal history of malignant neoplasm of breast: Secondary | ICD-10-CM | POA: Insufficient documentation

## 2016-12-18 DIAGNOSIS — I481 Persistent atrial fibrillation: Secondary | ICD-10-CM | POA: Insufficient documentation

## 2016-12-18 DIAGNOSIS — F419 Anxiety disorder, unspecified: Secondary | ICD-10-CM | POA: Insufficient documentation

## 2016-12-18 DIAGNOSIS — Z87891 Personal history of nicotine dependence: Secondary | ICD-10-CM | POA: Insufficient documentation

## 2016-12-18 DIAGNOSIS — M1990 Unspecified osteoarthritis, unspecified site: Secondary | ICD-10-CM | POA: Diagnosis not present

## 2016-12-18 DIAGNOSIS — J45909 Unspecified asthma, uncomplicated: Secondary | ICD-10-CM | POA: Insufficient documentation

## 2016-12-18 DIAGNOSIS — I4819 Other persistent atrial fibrillation: Secondary | ICD-10-CM

## 2016-12-18 MED ORDER — DILTIAZEM HCL ER COATED BEADS 120 MG PO CP24: 120.0000 mg | ORAL_CAPSULE | Freq: Every day | ORAL | 3 refills | Status: DC

## 2016-12-18 MED ORDER — METOPROLOL TARTRATE 25 MG PO TABS: 12.5000 mg | ORAL_TABLET | Freq: Two times a day (BID) | ORAL | 3 refills | Status: DC

## 2016-12-18 NOTE — Patient Instructions (Signed)
Your physician has recommended you make the following change in your medication:  1)Decrease Cardizem to 120mg  once a day   Call with a report of how you are tolerating medication -- [phone]

## 2016-12-19 NOTE — Progress Notes (Signed)
Primary Care Physician: [name], MD Referring Physician: Dr. Guerry Bishop is a 76 y.o. female with a h/o persistent afib that started several months ago. Rate control as been the choice of therapy. However, pt has not tolerated Cardizem for GI complaints and increased BB to 50 mg bid has caused more shortness of breath, h/o of asthma. She is being seen in the afib clinic to help with management. Currently, she would like to continue  with a rate control strategy.   Today, she denies symptoms of palpitations, chest pain, shortness of breath, orthopnea, PND, lower extremity edema, dizziness, presyncope, syncope, or neurologic sequela. The patient is tolerating medications without difficulties and is otherwise without complaint today.   Past Medical History:  Diagnosis Date  . Anxiety   . Arthritis   . Asthma   . Cancer [name] 2001   right breast  . Dyspnea   . GERD (gastroesophageal reflux disease)   . Hypertension    Past Surgical History:  Procedure Laterality Date  . CATARACT EXTRACTION W/PHACO  07/23/2011   Procedure: CATARACT EXTRACTION PHACO AND INTRAOCULAR LENS PLACEMENT (IOC);  Surgeon: Williams Che, MD;  Location: [facility];  Service: Ophthalmology;  Laterality: Right;  CDE:9.96  . CHOLECYSTECTOMY N/A 02/14/2015   Procedure: LAPAROSCOPIC CHOLECYSTECTOMY;  Surgeon: Aviva Signs, MD;  Location: [facility] ORS;  Service: General;  Laterality: N/A;  . MASTECTOMY     bilateral mastectomy-right breast cancer-left taken by choice  . SEPTOPLASTY  1995    Current Outpatient Prescriptions  Medication Sig Dispense Refill  . acetaminophen (TYLENOL) 650 MG CR tablet Take 650 mg by mouth at bedtime.    Marland Kitchen albuterol (PROVENTIL HFA;VENTOLIN HFA) 108 (90 BASE) MCG/ACT inhaler Inhale 1-2 puffs into the lungs every 6 (six) hours as needed for wheezing or shortness of breath.    . ALPRAZolam (XANAX) 0.5 MG tablet Take 0.5 mg by mouth 4 (four) times daily as needed. FOR  SHORTNESS OF BREATH    . BREO ELLIPTA 100-25 MCG/INH AEPB Inhale 1 puff into the lungs daily as needed.     . fluticasone (FLONASE) 50 MCG/ACT nasal spray Place 2 sprays into both nostrils daily. Allergies 1 g 2  . furosemide (LASIX) 40 MG tablet Take 1 tablet (40 mg total) by mouth daily. 30 tablet 3  . Glycerin-Hypromellose-PEG 400 (VISINE TEARS OP) Apply 1 drop to eye daily as needed. Watery Eyes    . ipratropium-albuterol (DUONEB) 0.5-2.5 (3) MG/3ML SOLN Take 3 mLs by nebulization every 4 (four) hours as needed.    Marland Kitchen omeprazole (PRILOSEC) 20 MG capsule Take 20 mg by mouth daily as needed.     . potassium chloride SA (K-DUR,KLOR-CON) 20 MEQ tablet Take 1 tablet (20 mEq total) by mouth once. 90 tablet 3  . warfarin (COUMADIN) 5 MG tablet Take 1 tablet (5 mg total) by mouth daily. 30 tablet 2  . diltiazem (CARDIZEM CD) 120 MG 24 hr capsule Take 1 capsule (120 mg total) by mouth daily. 30 capsule 3  . metoprolol tartrate (LOPRESSOR) 25 MG tablet Take 0.5 tablets (12.5 mg total) by mouth 2 (two) times daily. 180 tablet 3   No current facility-administered medications for this encounter.     Allergies  Allergen Reactions  . Sulfa Antibiotics Nausea And Vomiting  . Cardizem Cd [Diltiazem Hcl Er Beads] Diarrhea  . Chocolate Rash    Dark chocolate    Social History   Social History  . Marital status: Married  Spouse name: N/A  . Number of children: N/A  . Years of education: N/A   Occupational History  . Not on file.   Social History Main Topics  . Smoking status: Former Smoker    Packs/day: 1.00    Years: 28.00    Types: Cigarettes    Quit date: 07/17/1983  . Smokeless tobacco: Never Used  . Alcohol use No  . Drug use: No  . Sexual activity: No   Other Topics Concern  . Not on file   Social History Narrative  . No narrative on file    Family History  Problem Relation Age of Onset  . Cancer Mother   . Aneurysm Father   . Cancer Sister   . Cancer Sister   .  Anesthesia problems Neg Hx   . Hypotension Neg Hx   . Malignant hyperthermia Neg Hx   . Pseudochol deficiency Neg Hx     ROS- All systems are reviewed and negative except as per the HPI above  Physical Exam: Vitals:   12/18/16 1348  BP: (!) 144/72  Pulse: 90  Weight: 151 lb 9.6 oz (68.8 kg)  Height: 5\' 4"  (1.626 m)   Wt Readings from Last 3 Encounters:  12/18/16 151 lb 9.6 oz (68.8 kg)  12/06/16 150 lb (68 kg)  11/05/16 155 lb (70.3 kg)    Labs: Lab Results  Component Value Date   NA 141 11/01/2016   K 3.6 11/01/2016   CL 105 11/01/2016   CO2 27 11/01/2016   GLUCOSE 113 (H) 11/01/2016   BUN 15 11/01/2016   CREATININE 1.30 (H) 11/01/2016   CALCIUM 9.7 11/01/2016   MG 1.8 09/24/2016   Lab Results  Component Value Date   INR 3.4 12/10/2016   Lab Results  Component Value Date   CHOL 109 09/24/2016   HDL 34 (L) 09/24/2016   LDLCALC 62 09/24/2016   TRIG 66 09/24/2016     GEN- The patient is well appearing, alert and oriented x 3 today.   Head- normocephalic, atraumatic Eyes-  Sclera clear, conjunctiva pink Ears- hearing intact Oropharynx- clear Neck- supple, no JVP Lymph- no cervical lymphadenopathy Lungs- Clear to ausculation bilaterally, normal work of breathing Heart- irregular rate and rhythm, no murmurs, rubs or gallops, PMI not laterally displaced GI- soft, NT, ND, + BS Extremities- no clubbing, cyanosis, or edema MS- no significant deformity or atrophy Skin- no rash or lesion Psych- euthymic mood, full affect Neuro- strength and sensation are intact  EKG-afib at 90 bpm qrs int 86 ms, qtc 445 ms Epic records reviewed Echo-Study Conclusions  - Left ventricle: The cavity size was normal. Wall thickness was   normal. Systolic function was normal. The estimated ejection   fraction was in the range of 55% to 60%. Wall motion was normal;   there were no regional wall motion abnormalities. - Aortic valve: Mildly to moderately calcified annulus.  Mildly   thickened leaflets. Valve area (VTI): 2.92 cm^2. Valve area   (Vmax): 2.73 cm^2. Valve area (Vmean): 2.61 cm^2. - Mitral valve: There was moderate regurgitation. - Left atrium: The atrium was severely dilated. - Right atrium: The atrium was severely dilated. - Atrial septum: No defect or patent foramen ovale was identified. - Pulmonary arteries: Systolic pressure was moderately increased.   PA peak pressure: 49 mm Hg (S). - Pericardium, extracardiac: There is a small circumferential   pericardial effusion. - Technically adequate study.   Assessment and Plan: 1. Persistent afib General  education re  afib Options discussed but pt is very clear that she would like to continue rate control for now if we can fine tune her meds to minimize side effects She is currently tolerating metoprolol 25 mg 1/2 tab bid and v rate looks decent, will continue this dose She however is having some diarrhea, bloating from Cardizem, and is off right now Try this once again at 120 mg qd, last on 240 mg  Continue warfarin 5 mg qd for a chadsvasc score of at least 4  She will call the office in one week with BP/HR readings and her tolerance of above F/u will be based on this  Bailey Bishop, Bailey Bishop [address] [place], [place] [address] [phone]

## 2016-12-24 ENCOUNTER — Ambulatory Visit (INDEPENDENT_AMBULATORY_CARE_PROVIDER_SITE_OTHER): Payer: Medicare Other | Admitting: *Deleted

## 2016-12-24 DIAGNOSIS — Z7901 Long term (current) use of anticoagulants: Secondary | ICD-10-CM

## 2016-12-24 DIAGNOSIS — I4891 Unspecified atrial fibrillation: Secondary | ICD-10-CM

## 2016-12-24 LAB — POCT INR: INR: 3.4

## 2016-12-27 ENCOUNTER — Ambulatory Visit: Payer: Medicare Other | Admitting: Family Medicine

## 2016-12-28 ENCOUNTER — Telehealth (HOSPITAL_COMMUNITY): Payer: Self-pay | Admitting: *Deleted

## 2016-12-28 DIAGNOSIS — I482 Chronic atrial fibrillation: Secondary | ICD-10-CM | POA: Diagnosis not present

## 2016-12-28 DIAGNOSIS — I517 Cardiomegaly: Secondary | ICD-10-CM | POA: Diagnosis not present

## 2016-12-28 DIAGNOSIS — I5022 Chronic systolic (congestive) heart failure: Secondary | ICD-10-CM | POA: Diagnosis not present

## 2016-12-28 DIAGNOSIS — I10 Essential (primary) hypertension: Secondary | ICD-10-CM | POA: Diagnosis not present

## 2016-12-28 DIAGNOSIS — J45909 Unspecified asthma, uncomplicated: Secondary | ICD-10-CM | POA: Diagnosis not present

## 2016-12-28 MED ORDER — METOPROLOL TARTRATE 25 MG PO TABS: 50.0000 mg | ORAL_TABLET | Freq: Two times a day (BID) | ORAL | 3 refills | Status: DC

## 2016-12-28 NOTE — Telephone Encounter (Signed)
Patient called in stating she just did not tolerate the cardizem -- diarrhea as soon as she restarted it.  She saw her family doctor today and increased her metoprolol to 50mg  BID. She will call me on Tuesday/Wednesday with BP/HR readings on new dose.

## 2016-12-31 ENCOUNTER — Other Ambulatory Visit (HOSPITAL_COMMUNITY): Payer: Self-pay | Admitting: *Deleted

## 2016-12-31 ENCOUNTER — Telehealth (HOSPITAL_COMMUNITY): Payer: Self-pay | Admitting: *Deleted

## 2016-12-31 MED ORDER — CARVEDILOL 6.25 MG PO TABS: 6.2500 mg | ORAL_TABLET | Freq: Two times a day (BID) | ORAL | 3 refills | Status: DC

## 2016-12-31 NOTE — Telephone Encounter (Signed)
Pt called back in today stating the increased dose of metoprolol is making her very short of breath and fatigued. Pt does have history of asthma. Discussed with [name] NP -- will change metoprolol to coreg 6.25mg  twice a day and see if this is more tolerable for her. Appt made for Wednesday to reck patient.

## 2017-01-01 ENCOUNTER — Telehealth (HOSPITAL_COMMUNITY): Payer: Self-pay | Admitting: *Deleted

## 2017-01-01 MED ORDER — METOPROLOL TARTRATE 50 MG PO TABS: 25.0000 mg | ORAL_TABLET | Freq: Two times a day (BID) | ORAL | 0 refills | Status: DC

## 2017-01-01 NOTE — Telephone Encounter (Signed)
Pt cld and spoke with Butch Penny, reporting that she took her first dose of carvedilol last night and broke out in a rash.  Pt will discontinue the carvedilol and resume metoprolol 50 mg, 1/2 tablet bid.  Pt moved appt to next Tuesday for f/u

## 2017-01-02 ENCOUNTER — Ambulatory Visit (HOSPITAL_COMMUNITY): Payer: Medicare Other | Admitting: Nurse Practitioner

## 2017-01-02 ENCOUNTER — Telehealth: Payer: Self-pay | Admitting: *Deleted

## 2017-01-02 NOTE — Telephone Encounter (Signed)
Per Colletta Maryland at Dr. Durene Cal office, the pt will now be going to Dr. Durene Cal office for her PT/INR checks.

## 2017-01-03 ENCOUNTER — Ambulatory Visit (HOSPITAL_COMMUNITY): Payer: Medicare Other | Admitting: Nurse Practitioner

## 2017-01-07 ENCOUNTER — Ambulatory Visit (INDEPENDENT_AMBULATORY_CARE_PROVIDER_SITE_OTHER): Payer: Medicare Other | Admitting: *Deleted

## 2017-01-07 DIAGNOSIS — I4891 Unspecified atrial fibrillation: Secondary | ICD-10-CM | POA: Diagnosis not present

## 2017-01-07 DIAGNOSIS — Z7901 Long term (current) use of anticoagulants: Secondary | ICD-10-CM | POA: Diagnosis not present

## 2017-01-07 LAB — POCT INR: INR: 2.4

## 2017-01-08 ENCOUNTER — Ambulatory Visit (HOSPITAL_COMMUNITY)
Admission: RE | Admit: 2017-01-08 | Discharge: 2017-01-08 | Disposition: A | Discharge status: Home / Self Care | Payer: Medicare Other | Source: Ambulatory Visit | Attending: Nurse Practitioner | Admitting: Nurse Practitioner

## 2017-01-08 ENCOUNTER — Other Ambulatory Visit (HOSPITAL_COMMUNITY): Payer: Self-pay | Admitting: *Deleted

## 2017-01-08 ENCOUNTER — Encounter (HOSPITAL_COMMUNITY): Payer: Self-pay | Admitting: Nurse Practitioner

## 2017-01-08 VITALS — BP 114/76 | HR 117 | Ht 64.0 in | Wt 148.2 lb

## 2017-01-08 DIAGNOSIS — Z7901 Long term (current) use of anticoagulants: Secondary | ICD-10-CM | POA: Diagnosis not present

## 2017-01-08 DIAGNOSIS — Z9013 Acquired absence of bilateral breasts and nipples: Secondary | ICD-10-CM | POA: Diagnosis not present

## 2017-01-08 DIAGNOSIS — I10 Essential (primary) hypertension: Secondary | ICD-10-CM | POA: Diagnosis not present

## 2017-01-08 DIAGNOSIS — Z9889 Other specified postprocedural states: Secondary | ICD-10-CM | POA: Insufficient documentation

## 2017-01-08 DIAGNOSIS — Z79899 Other long term (current) drug therapy: Secondary | ICD-10-CM | POA: Insufficient documentation

## 2017-01-08 DIAGNOSIS — Z853 Personal history of malignant neoplasm of breast: Secondary | ICD-10-CM | POA: Diagnosis not present

## 2017-01-08 DIAGNOSIS — I4891 Unspecified atrial fibrillation: Secondary | ICD-10-CM

## 2017-01-08 DIAGNOSIS — Z87891 Personal history of nicotine dependence: Secondary | ICD-10-CM | POA: Insufficient documentation

## 2017-01-08 DIAGNOSIS — Z9049 Acquired absence of other specified parts of digestive tract: Secondary | ICD-10-CM | POA: Insufficient documentation

## 2017-01-08 DIAGNOSIS — F419 Anxiety disorder, unspecified: Secondary | ICD-10-CM | POA: Insufficient documentation

## 2017-01-08 DIAGNOSIS — I481 Persistent atrial fibrillation: Secondary | ICD-10-CM | POA: Insufficient documentation

## 2017-01-08 DIAGNOSIS — I4819 Other persistent atrial fibrillation: Secondary | ICD-10-CM

## 2017-01-08 MED ORDER — AMIODARONE HCL 200 MG PO TABS: 200.0000 mg | ORAL_TABLET | Freq: Two times a day (BID) | ORAL | 0 refills | Status: DC

## 2017-01-08 NOTE — Progress Notes (Signed)
Primary Care Physician: [name], MD Referring Physician: Dr. Guerry Bruin is a 76 y.o. female with a h/o persistent afib that started several months ago. Rate control as been the choice of therapy. However, pt has not tolerated Cardizem for GI complaints and increased BB to 50 mg bid has caused more shortness of breath, h/o of asthma. She is being seen in the afib clinic to help with management. She expressed desire to continue  with a rate control strategy.   F/u 10/16. In the interim since I saw pt, she tried low dose Cardizem and this dose caused diarrhea as well, with low dose metoprolol. She saw her PCP and metoprolol was increased to 50 mg bid for RVR. She noted increased shortness  of breath and reduced her dose to 25 mg bid. I then tried carvedilol, thinking this may aggravate her asthma less, but she developed a rash  after one dose.  She went back on metoprolol 50 mg bid and in the office still she is running at 117 bpm.  Discussed options with pt today to restore SR. On past clinic visits, we discussed restoring SR, but  she was against a cardioversion. Discussed in detail with pt today. Rate control has not been effective as she is not tolerating meds. She does not want to come into the hospital for meds. Does not  want any expensive drugs.  Today, she denies symptoms of palpitations, chest pain, shortness of breath, orthopnea, PND, lower extremity edema, dizziness, presyncope, syncope, or neurologic sequela. The patient is tolerating medications without difficulties and is otherwise without complaint today.   Past Medical History:  Diagnosis Date  . Anxiety   . Arthritis   . Asthma   . Cancer [facility]) 2001   right breast  . Dyspnea   . GERD (gastroesophageal reflux disease)   . Hypertension    Past Surgical History:  Procedure Laterality Date  . CATARACT EXTRACTION W/PHACO  07/23/2011   Procedure: CATARACT EXTRACTION PHACO AND INTRAOCULAR LENS PLACEMENT  (IOC);  Surgeon: Williams Che, MD;  Location: [facility];  Service: Ophthalmology;  Laterality: Right;  CDE:9.96  . CHOLECYSTECTOMY N/A 02/14/2015   Procedure: LAPAROSCOPIC CHOLECYSTECTOMY;  Surgeon: Aviva Signs, MD;  Location: [facility];  Service: General;  Laterality: N/A;  . MASTECTOMY     bilateral mastectomy-right breast cancer-left taken by choice  . SEPTOPLASTY  1995    Current Outpatient Prescriptions  Medication Sig Dispense Refill  . acetaminophen (TYLENOL) 650 MG CR tablet Take 650 mg by mouth at bedtime.    Marland Kitchen albuterol (PROVENTIL HFA;VENTOLIN HFA) 108 (90 BASE) MCG/ACT inhaler Inhale 1-2 puffs into the lungs every 6 (six) hours as needed for wheezing or shortness of breath.    . ALPRAZolam (XANAX) 0.5 MG tablet Take 0.5 mg by mouth 4 (four) times daily as needed. FOR SHORTNESS OF BREATH    . BREO ELLIPTA 100-25 MCG/INH AEPB Inhale 1 puff into the lungs daily as needed.     . fluticasone (FLONASE) 50 MCG/ACT nasal spray Place 2 sprays into both nostrils daily. Allergies 1 g 2  . furosemide (LASIX) 40 MG tablet Take 1 tablet (40 mg total) by mouth daily. 30 tablet 3  . Glycerin-Hypromellose-PEG 400 (VISINE TEARS OP) Apply 1 drop to eye daily as needed. Watery Eyes    . ipratropium-albuterol (DUONEB) 0.5-2.5 (3) MG/3ML SOLN Take 3 mLs by nebulization every 4 (four) hours as needed.    . metoprolol tartrate (LOPRESSOR) 50  MG tablet Take 0.5 tablets (25 mg total) by mouth 2 (two) times daily. 90 tablet 0  . omeprazole (PRILOSEC) 20 MG capsule Take 20 mg by mouth daily as needed.     . potassium chloride SA (K-DUR,KLOR-CON) 20 MEQ tablet Take 1 tablet (20 mEq total) by mouth once. 90 tablet 3  . warfarin (COUMADIN) 5 MG tablet Take 1 tablet (5 mg total) by mouth daily. 30 tablet 2   No current facility-administered medications for this encounter.     Allergies  Allergen Reactions  . Cardizem Cd [Diltiazem Hcl Er Beads] Diarrhea  . Sulfa Antibiotics Nausea And Vomiting  . Carvedilol  Rash  . Chocolate Rash    Dark chocolate    Social History   Social History  . Marital status: Married    Spouse name: N/A  . Number of children: N/A  . Years of education: N/A   Occupational History  . Not on file.   Social History Main Topics  . Smoking status: Former Smoker    Packs/day: 1.00    Years: 28.00    Types: Cigarettes    Quit date: 07/17/1983  . Smokeless tobacco: Never Used  . Alcohol use No  . Drug use: No  . Sexual activity: No   Other Topics Concern  . Not on file   Social History Narrative  . No narrative on file    Family History  Problem Relation Age of Onset  . Cancer Mother   . Aneurysm Father   . Cancer Sister   . Cancer Sister   . Anesthesia problems Neg Hx   . Hypotension Neg Hx   . Malignant hyperthermia Neg Hx   . Pseudochol deficiency Neg Hx     ROS- All systems are reviewed and negative except as per the HPI above  Physical Exam: Vitals:   01/08/17 1031  BP: 114/76  Pulse: (!) 117  SpO2: 98%  Weight: 148 lb 3.2 oz (67.2 kg)  Height: 5\' 4"  (1.626 m)   Wt Readings from Last 3 Encounters:  01/08/17 148 lb 3.2 oz (67.2 kg)  12/18/16 151 lb 9.6 oz (68.8 kg)  12/06/16 150 lb (68 kg)    Labs: Lab Results  Component Value Date   NA 141 11/01/2016   K 3.6 11/01/2016   CL 105 11/01/2016   CO2 27 11/01/2016   GLUCOSE 113 (H) 11/01/2016   BUN 15 11/01/2016   CREATININE 1.30 (H) 11/01/2016   CALCIUM 9.7 11/01/2016   MG 1.8 09/24/2016   Lab Results  Component Value Date   INR 2.4 01/07/2017   Lab Results  Component Value Date   CHOL 109 09/24/2016   HDL 34 (L) 09/24/2016   LDLCALC 62 09/24/2016   TRIG 66 09/24/2016     GEN- The patient is well appearing, alert and oriented x 3 today.   Head- normocephalic, atraumatic Eyes-  Sclera clear, conjunctiva pink Ears- hearing intact Oropharynx- clear Neck- supple, no JVP Lymph- no cervical lymphadenopathy Lungs- Clear to ausculation bilaterally, normal work of  breathing Heart- irregular rate and rhythm, no murmurs, rubs or gallops, PMI not laterally displaced GI- soft, NT, ND, + BS Extremities- no clubbing, cyanosis, or edema MS- no significant deformity or atrophy Skin- no rash or lesion Psych- euthymic mood, full affect Neuro- strength and sensation are intact  EKG-afib at 90 bpm qrs int 86 ms, qtc 445 ms Epic records reviewed Echo-Study Conclusions  - Left ventricle: The cavity size was normal. Wall thickness was  normal. Systolic function was normal. The estimated ejection   fraction was in the range of 55% to 60%. Wall motion was normal;   there were no regional wall motion abnormalities. - Aortic valve: Mildly to moderately calcified annulus. Mildly   thickened leaflets. Valve area (VTI): 2.92 cm^2. Valve area   (Vmax): 2.73 cm^2. Valve area (Vmean): 2.61 cm^2. - Mitral valve: There was moderate regurgitation. - Left atrium: The atrium was severely dilated. - Right atrium: The atrium was severely dilated. - Atrial septum: No defect or patent foramen ovale was identified. - Pulmonary arteries: Systolic pressure was moderately increased.   PA peak pressure: 49 mm Hg (S). - Pericardium, extracardiac: There is a small circumferential   pericardial effusion. - Technically adequate study.   Assessment and Plan: 1. Persistent afib General  education re afib Options discussed, initially wanted to avoid cardioversion but with issues with rate control meds/side effects, she is willing to consider antiarrythmic's and cardioversion if needed Decrease metoprolol to 25 mg bid Add amiodarone 200 mg bid a day Did not tolerate Cardizem for diarrhea  Continue warfarin 5 mg qd for a chadsvasc score of at least 4, has been therapeutic since 8/20, but will arrange for weekly INR's for addition of amiodarone as well as anticipated cardioversion in 4 weeks.  EKG in [place] office and fax here one week on amiodarone Will see back in 3  weeks  Butch Penny C. Navada Osterhout, [facility] [address] [place], [place] [id number] [phone]

## 2017-01-09 ENCOUNTER — Ambulatory Visit: Payer: Medicare Other | Admitting: Physician Assistant

## 2017-01-15 ENCOUNTER — Other Ambulatory Visit: Payer: Self-pay | Admitting: Cardiovascular Disease

## 2017-01-16 NOTE — Telephone Encounter (Signed)
-----   Message from Malen Gauze, RN sent at 01/10/2017  4:37 PM EDT ----- Regarding: FW: weekly INRs 10/25 /18  INR at 8:15 and EKG at 8:30  [facility] for pt with date and time. ----- Message ----- From: Juluis Mire, RN Sent: 01/08/2017   4:49 PM To: Malen Gauze, RN Subject: weekly INRs                                    Pt is pending cardioversion (in about a month) after amiodarone load (starting this 10/17) she will need weekly INRs starting next week.   Pt needs EKG in 1 week as well with ekg faxed to donna at [phone] for review. Can this be combined with her coumadin check in 1 week?  Pt will be expecting your call to have coumadin check/EKG in 1 week. Thank you so much! Stacy RN [facility]

## 2017-01-17 ENCOUNTER — Ambulatory Visit (INDEPENDENT_AMBULATORY_CARE_PROVIDER_SITE_OTHER): Payer: Medicare Other | Admitting: *Deleted

## 2017-01-17 ENCOUNTER — Telehealth (HOSPITAL_COMMUNITY): Payer: Self-pay | Admitting: *Deleted

## 2017-01-17 DIAGNOSIS — Z7901 Long term (current) use of anticoagulants: Secondary | ICD-10-CM

## 2017-01-17 DIAGNOSIS — I4891 Unspecified atrial fibrillation: Secondary | ICD-10-CM

## 2017-01-17 LAB — POCT INR: INR: 6.3

## 2017-01-17 NOTE — Progress Notes (Signed)
Patient in office this morning for EKG.  See EKG scanned into EPIC (sent to [name], NP) in box.  Patient did c/o some nausea related to her Amiodarone.

## 2017-01-17 NOTE — Telephone Encounter (Signed)
Pt reporting nausea since starting amiodarone -- she would prefer to stay on BID dosing so it doesn't slow things down but will let us know if the nausea prevents her from being able to eat. Pt will call if nausea persists.

## 2017-01-18 ENCOUNTER — Other Ambulatory Visit: Payer: Self-pay

## 2017-01-18 MED ORDER — FUROSEMIDE 40 MG PO TABS: 40.0000 mg | ORAL_TABLET | Freq: Every day | ORAL | 6 refills | Status: DC

## 2017-01-21 ENCOUNTER — Ambulatory Visit (INDEPENDENT_AMBULATORY_CARE_PROVIDER_SITE_OTHER): Payer: Medicare Other | Admitting: *Deleted

## 2017-01-21 DIAGNOSIS — Z7901 Long term (current) use of anticoagulants: Secondary | ICD-10-CM | POA: Diagnosis not present

## 2017-01-21 DIAGNOSIS — I4891 Unspecified atrial fibrillation: Secondary | ICD-10-CM | POA: Diagnosis not present

## 2017-01-21 LAB — POCT INR: INR: 2.4

## 2017-01-22 ENCOUNTER — Encounter (HOSPITAL_COMMUNITY): Payer: Self-pay | Admitting: *Deleted

## 2017-01-22 ENCOUNTER — Emergency Department (HOSPITAL_COMMUNITY)
Admission: EM | Admit: 2017-01-22 | Discharge: 2017-01-22 | Disposition: A | Discharge status: Home / Self Care | Payer: Medicare Other | Attending: Emergency Medicine | Admitting: Emergency Medicine

## 2017-01-22 DIAGNOSIS — R17 Unspecified jaundice: Secondary | ICD-10-CM | POA: Diagnosis not present

## 2017-01-22 DIAGNOSIS — N182 Chronic kidney disease, stage 2 (mild): Secondary | ICD-10-CM | POA: Insufficient documentation

## 2017-01-22 DIAGNOSIS — R197 Diarrhea, unspecified: Secondary | ICD-10-CM

## 2017-01-22 DIAGNOSIS — T50905A Adverse effect of unspecified drugs, medicaments and biological substances, initial encounter: Secondary | ICD-10-CM | POA: Diagnosis not present

## 2017-01-22 DIAGNOSIS — J449 Chronic obstructive pulmonary disease, unspecified: Secondary | ICD-10-CM | POA: Diagnosis not present

## 2017-01-22 DIAGNOSIS — T887XXA Unspecified adverse effect of drug or medicament, initial encounter: Secondary | ICD-10-CM | POA: Insufficient documentation

## 2017-01-22 DIAGNOSIS — Z9013 Acquired absence of bilateral breasts and nipples: Secondary | ICD-10-CM | POA: Insufficient documentation

## 2017-01-22 DIAGNOSIS — Z853 Personal history of malignant neoplasm of breast: Secondary | ICD-10-CM | POA: Insufficient documentation

## 2017-01-22 DIAGNOSIS — J45909 Unspecified asthma, uncomplicated: Secondary | ICD-10-CM | POA: Insufficient documentation

## 2017-01-22 DIAGNOSIS — R112 Nausea with vomiting, unspecified: Secondary | ICD-10-CM | POA: Diagnosis not present

## 2017-01-22 DIAGNOSIS — I129 Hypertensive chronic kidney disease with stage 1 through stage 4 chronic kidney disease, or unspecified chronic kidney disease: Secondary | ICD-10-CM | POA: Diagnosis not present

## 2017-01-22 DIAGNOSIS — Y69 Unspecified misadventure during surgical and medical care: Secondary | ICD-10-CM | POA: Insufficient documentation

## 2017-01-22 DIAGNOSIS — Z7901 Long term (current) use of anticoagulants: Secondary | ICD-10-CM | POA: Insufficient documentation

## 2017-01-22 DIAGNOSIS — Z87891 Personal history of nicotine dependence: Secondary | ICD-10-CM | POA: Diagnosis not present

## 2017-01-22 DIAGNOSIS — R7989 Other specified abnormal findings of blood chemistry: Secondary | ICD-10-CM | POA: Diagnosis not present

## 2017-01-22 DIAGNOSIS — R748 Abnormal levels of other serum enzymes: Secondary | ICD-10-CM | POA: Insufficient documentation

## 2017-01-22 DIAGNOSIS — Z79899 Other long term (current) drug therapy: Secondary | ICD-10-CM | POA: Diagnosis not present

## 2017-01-22 HISTORY — DX: Unspecified atrial fibrillation: I48.91

## 2017-01-22 LAB — COMPREHENSIVE METABOLIC PANEL
ALT: 16 U/L (ref 14–54)
AST: 33 U/L (ref 15–41)
Albumin: 4 g/dL (ref 3.5–5.0)
Alkaline Phosphatase: 112 U/L (ref 38–126)
Anion gap: 10 (ref 5–15)
BILIRUBIN TOTAL: 2 mg/dL — AB (ref 0.3–1.2)
BUN: 18 mg/dL (ref 6–20)
CHLORIDE: 96 mmol/L — AB (ref 101–111)
CO2: 28 mmol/L (ref 22–32)
CREATININE: 1.68 mg/dL — AB (ref 0.44–1.00)
Calcium: 9.5 mg/dL (ref 8.9–10.3)
GFR calc Af Amer: 33 mL/min — ABNORMAL LOW (ref >60)
GFR, EST NON AFRICAN AMERICAN: 28 mL/min — AB (ref >60)
Glucose, Bld: 112 mg/dL — ABNORMAL HIGH (ref 65–99)
Potassium: 3.7 mmol/L (ref 3.5–5.1)
Sodium: 134 mmol/L — ABNORMAL LOW (ref 135–145)
Total Protein: 7.8 g/dL (ref 6.5–8.1)

## 2017-01-22 LAB — URINALYSIS, ROUTINE W REFLEX MICROSCOPIC
Bilirubin Urine: NEGATIVE
GLUCOSE, UA: NEGATIVE mg/dL
KETONES UR: NEGATIVE mg/dL
Nitrite: NEGATIVE
PH: 6 (ref 5.0–8.0)
Protein, ur: NEGATIVE mg/dL
SPECIFIC GRAVITY, URINE: 1.008 (ref 1.005–1.030)

## 2017-01-22 LAB — CBC
HCT: 38.8 % (ref 36.0–46.0)
Hemoglobin: 12.7 g/dL (ref 12.0–15.0)
MCH: 28.9 pg (ref 26.0–34.0)
MCHC: 32.7 g/dL (ref 30.0–36.0)
MCV: 88.2 fL (ref 78.0–100.0)
PLATELETS: 260 10*3/uL (ref 150–400)
RBC: 4.4 MIL/uL (ref 3.87–5.11)
RDW: 15.6 % — AB (ref 11.5–15.5)
WBC: 7.7 10*3/uL (ref 4.0–10.5)

## 2017-01-22 LAB — LIPASE, BLOOD: LIPASE: 105 U/L — AB (ref 11–51)

## 2017-01-22 MED ORDER — ONDANSETRON HCL 4 MG PO TABS: 4.0000 mg | ORAL_TABLET | Freq: Three times a day (TID) | ORAL | 0 refills | Status: DC | PRN

## 2017-01-22 MED ORDER — SODIUM CHLORIDE 0.9 % IV BOLUS (SEPSIS)
1000.0000 mL | Freq: Once | INTRAVENOUS | Status: AC
  Administered 2017-01-22: 1000 mL via INTRAVENOUS

## 2017-01-22 MED ORDER — ONDANSETRON HCL 4 MG/2ML IJ SOLN
4.0000 mg | Freq: Once | INTRAMUSCULAR | Status: AC
  Administered 2017-01-22: 4 mg via INTRAVENOUS
  Filled 2017-01-22: qty 2

## 2017-01-22 NOTE — ED Provider Notes (Signed)
[facility] EMERGENCY DEPARTMENT Provider Note   CSN: [id number] Arrival date & time: 01/22/17  1135     History   Chief Complaint Chief Complaint  Patient presents with  . Emesis    HPI Bailey Bishop is a 76 y.o. female.  HPI The patient presents to the emergency room for evaluation of nausea vomiting and patient thinks the symptoms may be related to a new medication that she started for her atrial fibrillation, amiodarone.  Patient states she started that medication last week.  Day or so later she started having trouble with nausea, vomiting, and diarrhea.  Patient states she is primarily having nausea and vomiting in the morning.  She has been able to keep down some ginger ale but is not really able to eat anything.  Whenever she thinks about having something more solid she gets more nauseated.  She has had several episodes of diarrhea as well.  She denies any abdominal pain.  She denies any fevers she denies any chest pain or shortness of breath.  She called her cardiology office and was instructed to cut back on the amiodarone and to try Pepto-Bismol.  Her symptoms persisted so she came to the emergency room Past Medical History:  Diagnosis Date  . Anxiety   . Arthritis   . Asthma   . Atrial fibrillation ([place])   . Cancer [facility]) 2001   right breast  . Dyspnea   . GERD (gastroesophageal reflux disease)   . Hypertension     Patient Active Problem List   Diagnosis Date Noted  . Long term (current) use of anticoagulants [Z79.01] 11/06/2016  . Anticoagulated 10/03/2016  . COPD (chronic obstructive pulmonary disease) ([place]) 10/03/2016  . Hypokalemia 09/24/2016  . Asthma in adult without complication 93/81/0175  . Acute CHF ([place]) 09/24/2016  . Atrial fibrillation with RVR ([place]) 09/23/2016  . CKD (chronic kidney disease), stage II 09/23/2016  . Hyperglycemia 09/23/2016  . Anxiety   . GERD (gastroesophageal reflux disease)   . Shortness of breath   . Essential  hypertension   . Status asthmaticus 07/07/2014    Past Surgical History:  Procedure Laterality Date  . CATARACT EXTRACTION W/PHACO  07/23/2011   Procedure: CATARACT EXTRACTION PHACO AND INTRAOCULAR LENS PLACEMENT (IOC);  Surgeon: Williams Che, MD;  Location: [facility];  Service: Ophthalmology;  Laterality: Right;  CDE:9.96  . CHOLECYSTECTOMY N/A 02/14/2015   Procedure: LAPAROSCOPIC CHOLECYSTECTOMY;  Surgeon: Aviva Signs, MD;  Location: [facility];  Service: General;  Laterality: N/A;  . MASTECTOMY     bilateral mastectomy-right breast cancer-left taken by choice  . SEPTOPLASTY  1995    OB History    No data available       Home Medications    Prior to Admission medications   Medication Sig Start Date End Date Taking? Authorizing Provider  acetaminophen (TYLENOL) 650 MG CR tablet Take 650 mg by mouth at bedtime.   Yes [provider]  albuterol (PROVENTIL HFA;VENTOLIN HFA) 108 (90 BASE) MCG/ACT inhaler Inhale 1-2 puffs into the lungs every 6 (six) hours as needed for wheezing or shortness of breath.   Yes [provider]  amiodarone (PACERONE) 200 MG tablet Take 1 tablet (200 mg total) by mouth 2 (two) times daily. 01/08/17  Yes Sherran Needs, NP  BREO ELLIPTA 100-25 MCG/INH AEPB Inhale 1 puff into the lungs daily as needed.  01/31/15  Yes [provider]  furosemide (LASIX) 40 MG tablet Take 1 tablet (40 mg total) by  mouth daily. 01/18/17  Yes Herminio Commons, MD  ipratropium-albuterol (DUONEB) 0.5-2.5 (3) MG/3ML SOLN Take 3 mLs by nebulization every 4 (four) hours as needed.   Yes [provider]  metoprolol tartrate (LOPRESSOR) 50 MG tablet Take 0.5 tablets (25 mg total) by mouth 2 (two) times daily. 01/01/17 04/01/17 Yes Sherran Needs, NP  omeprazole (PRILOSEC) 20 MG capsule Take 20 mg by mouth daily as needed (indegestion).  02/14/16  Yes [provider]  potassium chloride SA (K-DUR,KLOR-CON) 20 MEQ tablet Take 1 tablet (20 mEq  total) by mouth once. 10/25/16 01/22/18 Yes Lendon Colonel, NP  warfarin (COUMADIN) 5 MG tablet Take 1/2 tablet daily except 1 tablet on Sundays, Tuesdays and Thursdays Patient taking differently: Take 1/2 tablet daily except 1 tablet on Sundays and Thursdays 01/15/17  Yes Herminio Commons, MD  ALPRAZolam Duanne Moron) 0.5 MG tablet Take 0.5 mg by mouth 4 (four) times daily as needed. FOR SHORTNESS OF BREATH 06/18/14   [provider]  Glycerin-Hypromellose-PEG 400 (VISINE TEARS OP) Apply 1 drop to eye daily as needed. Watery Eyes    [provider]  ondansetron (ZOFRAN) 4 MG tablet Take 1 tablet (4 mg total) by mouth every 8 (eight) hours as needed for nausea or vomiting. 01/22/17   Dorie Rank, MD    Family History Family History  Problem Relation Age of Onset  . Cancer Mother   . Aneurysm Father   . Cancer Sister   . Cancer Sister   . Anesthesia problems Neg Hx   . Hypotension Neg Hx   . Malignant hyperthermia Neg Hx   . Pseudochol deficiency Neg Hx     Social History Social History  Substance Use Topics  . Smoking status: Former Smoker    Packs/day: 1.00    Years: 28.00    Types: Cigarettes    Quit date: 07/17/1983  . Smokeless tobacco: Never Used  . Alcohol use No     Allergies   Cardizem cd [diltiazem hcl er beads]; Sulfa antibiotics; Carvedilol; and Chocolate   Review of Systems Review of Systems  Constitutional: Negative for fever.  Respiratory: Negative for chest tightness.   Gastrointestinal: Negative for abdominal distention and abdominal pain.  Endocrine: Negative for polyuria.  Genitourinary: Negative for dysuria.  Neurological: Negative for speech difficulty.  All other systems reviewed and are negative.    Physical Exam Updated Vital Signs BP (!) 150/98 (BP Location: Left Arm)   Pulse 84   Temp 98.3 F (36.8 C) (Temporal)   Resp 19   Ht 1.626 m (5\' 4" )   Wt 62.6 kg (138 lb)   SpO2 96%   BMI 23.69 kg/m   Physical Exam    Constitutional: She appears well-developed and well-nourished. No distress.  HENT:  Head: Normocephalic and atraumatic.  Right Ear: External ear normal.  Left Ear: External ear normal.  Eyes: Conjunctivae are normal. Right eye exhibits no discharge. Left eye exhibits no discharge. No scleral icterus.  Neck: Neck supple. No tracheal deviation present.  Cardiovascular: Normal rate, regular rhythm and intact distal pulses.   Pulmonary/Chest: Effort normal and breath sounds normal. No stridor. No respiratory distress. She has no wheezes. She has no rales.  Abdominal: Soft. Bowel sounds are normal. She exhibits no distension. There is no tenderness. There is no rebound and no guarding.  Musculoskeletal: She exhibits no edema or tenderness.  Neurological: She is alert. She has normal strength. No cranial nerve deficit (no facial droop, extraocular movements intact, no  slurred speech) or sensory deficit. She exhibits normal muscle tone. She displays no seizure activity. Coordination normal.  Skin: Skin is warm and dry. No rash noted.  Psychiatric: She has a normal mood and affect.  Nursing note and vitals reviewed.    ED Treatments / Results  Labs (all labs ordered are listed, but only abnormal results are displayed) Labs Reviewed  LIPASE, BLOOD - Abnormal; Notable for the following:       Result Value   Lipase 105 (*)    All other components within normal limits  COMPREHENSIVE METABOLIC PANEL - Abnormal; Notable for the following:    Sodium 134 (*)    Chloride 96 (*)    Glucose, Bld 112 (*)    Creatinine, Ser 1.68 (*)    Total Bilirubin 2.0 (*)    GFR calc non Af Amer 28 (*)    GFR calc Af Amer 33 (*)    All other components within normal limits  CBC - Abnormal; Notable for the following:    RDW 15.6 (*)    All other components within normal limits  URINALYSIS, ROUTINE W REFLEX MICROSCOPIC - Abnormal; Notable for the following:    Hgb urine dipstick SMALL (*)    Leukocytes, UA  TRACE (*)    Bacteria, UA RARE (*)    Squamous Epithelial / LPF 0-5 (*)    All other components within normal limits    EKG  EKG Interpretation  Date/Time:  Tuesday January 22 2017 15:05:39 EDT Ventricular Rate:  85 PR Interval:    QRS Duration: 98 QT Interval:  421 QTC Calculation: 501 R Axis:   -11 Text Interpretation:  Atrial fibrillation Ventricular premature complex Borderline low voltage, extremity leads Anteroseptal infarct, old Prolonged QT interval No significant change since last tracing except qt prolongation Confirmed by Dorie Rank [phone]) on 01/22/2017 3:13:14 PM       Radiology No results found.  Procedures Procedures (including critical care time)  Medications Ordered in ED Medications  sodium chloride 0.9 % bolus 1,000 mL (0 mLs Intravenous Stopped 01/22/17 1605)  ondansetron (ZOFRAN) injection 4 mg (4 mg Intravenous Given 01/22/17 1455)     Initial Impression / Assessment and Plan / ED Course  I have reviewed the triage vital signs and the nursing notes.  Pertinent labs & imaging results that were available during my care of the patient were reviewed by me and considered in my medical decision making (see chart for details).  Clinical Course as of Jan 22 1714  Tue Jan 22, 2017  1447 Previous imaging tests reviewed.  Normal pancreas noted on CAT scan in August of this year.  Previous lipase levels were also mildly elevated  [JK]  1707 Pt is feeling much better after fluids and zofran.  She is hungry and ready to go home.  [JK]    Clinical Course User Index [JK] Dorie Rank, MD    Patient presented to the emergency room for evaluation of vomiting and diarrhea.  Patient thinks her symptoms are related to amiodarone that she started just prior to her symptom onset.  Patient denies any abdominal pain.  Laboratory tests were notable for mild increase in her BUN and creatinine.  I think this is consistent with the dehydration she felt from the vomiting and  diarrhea.  She does have a mildly elevated lipase and bilirubin.  Her lipase has been elevated in the past.  I do not have any old bilirubins for comparison.  I do not think  that related to her acute illness right now.  I do recommend she follow-up with her primary doctor to have that rechecked.  Patient improved with IV fluids.  She is hungry and ready to go home.  I will have her hold the amiodarone.  I gave her a prescription for Zofran.  Instructed to return for fever vomiting abdominal pain worsening symptoms  Final Clinical Impressions(s) / ED Diagnoses   Final diagnoses:  Nausea vomiting and diarrhea  Medication adverse effect, initial encounter  Elevated lipase  Elevated bilirubin    New Prescriptions New Prescriptions   ONDANSETRON (ZOFRAN) 4 MG TABLET    Take 1 tablet (4 mg total) by mouth every 8 (eight) hours as needed for nausea or vomiting.     Dorie Rank, MD 01/22/17 [phone]

## 2017-01-22 NOTE — ED Triage Notes (Addendum)
Pt c/o nausea, vomiting, "feeling wired", not sleeping, diarrhea since Friday when she was put on Amiodarone by her cardiologist. Pt called her cardiologist and she was told to try Momence but pt reports she threw the medicine back up.

## 2017-01-22 NOTE — Discharge Instructions (Signed)
Stop taking the amiodarone for now.  Speak with your cardiologist about whether to continue.  Take the medications as needed for nausea.  Follow-up with your primary doctor as we discussed to recheck your bilirubin level and lipase level.  Return to the emergency room if you start having fevers pain or worsening symptoms

## 2017-01-23 ENCOUNTER — Telehealth (HOSPITAL_COMMUNITY): Payer: Self-pay | Admitting: *Deleted

## 2017-01-23 ENCOUNTER — Telehealth: Payer: Self-pay | Admitting: *Deleted

## 2017-01-23 NOTE — Telephone Encounter (Signed)
Pt called in stating she ended up in the ER due to nausea/diarrhea. ER doctor instructed pt to hold amiodarone until she talked with donna -- recommended she stop amiodarone and continue metoprolol 25mg  BID and keep log of HR/BP and call Monday with trend. Pt states she is feeling better after zofran/IVF in ER.

## 2017-01-23 NOTE — Telephone Encounter (Signed)
Please call patient regarding medication changes / tg  ° °

## 2017-01-23 NOTE — Telephone Encounter (Signed)
Pt came to ED yesterday due to N/V from Amiodarone.  Metoprolol was increased and Amiodarone was discontinued.  Will leave coumadin dose the same until INR check on 01/28/17.

## 2017-01-31 ENCOUNTER — Ambulatory Visit (HOSPITAL_COMMUNITY)
Admission: RE | Admit: 2017-01-31 | Discharge: 2017-01-31 | Disposition: A | Discharge status: Home / Self Care | Payer: Medicare Other | Source: Ambulatory Visit | Attending: Nurse Practitioner | Admitting: Nurse Practitioner

## 2017-01-31 ENCOUNTER — Encounter (HOSPITAL_COMMUNITY): Payer: Self-pay | Admitting: Nurse Practitioner

## 2017-01-31 VITALS — BP 124/78 | HR 71 | Ht 64.0 in | Wt 152.0 lb

## 2017-01-31 DIAGNOSIS — Z87891 Personal history of nicotine dependence: Secondary | ICD-10-CM | POA: Diagnosis not present

## 2017-01-31 DIAGNOSIS — I481 Persistent atrial fibrillation: Secondary | ICD-10-CM | POA: Diagnosis not present

## 2017-01-31 DIAGNOSIS — Z809 Family history of malignant neoplasm, unspecified: Secondary | ICD-10-CM | POA: Insufficient documentation

## 2017-01-31 DIAGNOSIS — Z888 Allergy status to other drugs, medicaments and biological substances status: Secondary | ICD-10-CM | POA: Diagnosis not present

## 2017-01-31 DIAGNOSIS — I10 Essential (primary) hypertension: Secondary | ICD-10-CM | POA: Insufficient documentation

## 2017-01-31 DIAGNOSIS — J45909 Unspecified asthma, uncomplicated: Secondary | ICD-10-CM | POA: Diagnosis not present

## 2017-01-31 DIAGNOSIS — Z9013 Acquired absence of bilateral breasts and nipples: Secondary | ICD-10-CM | POA: Diagnosis not present

## 2017-01-31 DIAGNOSIS — Z882 Allergy status to sulfonamides status: Secondary | ICD-10-CM | POA: Insufficient documentation

## 2017-01-31 DIAGNOSIS — F419 Anxiety disorder, unspecified: Secondary | ICD-10-CM | POA: Diagnosis not present

## 2017-01-31 DIAGNOSIS — Z7902 Long term (current) use of antithrombotics/antiplatelets: Secondary | ICD-10-CM | POA: Diagnosis not present

## 2017-01-31 DIAGNOSIS — Z91018 Allergy to other foods: Secondary | ICD-10-CM | POA: Insufficient documentation

## 2017-01-31 DIAGNOSIS — Z853 Personal history of malignant neoplasm of breast: Secondary | ICD-10-CM | POA: Diagnosis not present

## 2017-01-31 DIAGNOSIS — K219 Gastro-esophageal reflux disease without esophagitis: Secondary | ICD-10-CM | POA: Insufficient documentation

## 2017-01-31 DIAGNOSIS — M1990 Unspecified osteoarthritis, unspecified site: Secondary | ICD-10-CM | POA: Diagnosis not present

## 2017-01-31 DIAGNOSIS — I4819 Other persistent atrial fibrillation: Secondary | ICD-10-CM

## 2017-01-31 DIAGNOSIS — Z79899 Other long term (current) drug therapy: Secondary | ICD-10-CM | POA: Diagnosis not present

## 2017-01-31 DIAGNOSIS — Z8249 Family history of ischemic heart disease and other diseases of the circulatory system: Secondary | ICD-10-CM | POA: Diagnosis not present

## 2017-01-31 NOTE — H&P (View-Only) (Signed)
Primary Care Physician: Sharilyn Sites, MD Referring Physician: Dr. Guerry Bishop is a 76 y.o. female with a h/o persistent afib that started several months ago. Rate control as been the choice of therapy. However, pt has not tolerated Cardizem for GI complaints and increased BB to 50 mg bid has caused more shortness of breath, h/o of asthma. She was seen initially  seen in the afib clinic, 12/18/16 to help with management. She expressed desire to continue  with a rate control strategy.   F/u 10/16. In the interim since I saw pt, she tried low dose Cardizem and this dose caused diarrhea as well, with low dose metoprolol. She saw her PCP and metoprolol was increased to 50 mg bid for RVR. She noted increased shortness  of breath and reduced her dose to 25 mg bid. I then tried carvedilol, thinking this may aggravate her asthma less, but she developed a rash  after one dose.  She went back on metoprolol 50 mg bid and in the office still she is running at 117 bpm.  Discussed options with pt today to restore SR. On past clinic visits, we discussed restoring SR, but  she was against a cardioversion. Discussed in detail with pt today. Rate control has not been effective as she is not tolerating meds. She does not want to come into the hospital for meds. Does not  want any expensive drugs. Amiodarone was started with pt being willing to do cardioversion after loading.  F/u in afib clinic, 11/8, she did not tolerate Amiodarone with diarrhea, N/V and stopped drug. Ekg today shows afib  but with good controlled v rate in the low 70's. She feels improved off amiodarone, "meds never agree with me". She would like to proceed with plans for cardioversion. She will need 3 more weekly IRR's and would like this scheduled at [facility].  Today, she denies symptoms of palpitations, chest pain, shortness of breath, orthopnea, PND, lower extremity edema, dizziness, presyncope, syncope, or neurologic  sequela. The patient is tolerating medications without difficulties and is otherwise without complaint today.   Past Medical History:  Diagnosis Date  . Anxiety   . Arthritis   . Asthma   . Atrial fibrillation ([place])   . Cancer [facility]) 2001   right breast  . Dyspnea   . GERD (gastroesophageal reflux disease)   . Hypertension    Past Surgical History:  Procedure Laterality Date  . MASTECTOMY     bilateral mastectomy-right breast cancer-left taken by choice  . SEPTOPLASTY  1995    Current Outpatient Medications  Medication Sig Dispense Refill  . acetaminophen (TYLENOL) 650 MG CR tablet Take 650 mg by mouth at bedtime.    Marland Kitchen albuterol (PROVENTIL HFA;VENTOLIN HFA) 108 (90 BASE) MCG/ACT inhaler Inhale 1-2 puffs into the lungs every 6 (six) hours as needed for wheezing or shortness of breath.    . ALPRAZolam (XANAX) 0.5 MG tablet Take 0.5 mg by mouth 4 (four) times daily as needed. FOR SHORTNESS OF BREATH    . BREO ELLIPTA 100-25 MCG/INH AEPB Inhale 1 puff into the lungs daily as needed.     . furosemide (LASIX) 40 MG tablet Take 1 tablet (40 mg total) by mouth daily. 30 tablet 6  . Glycerin-Hypromellose-PEG 400 (VISINE TEARS OP) Apply 1 drop to eye daily as needed. Watery Eyes    . ipratropium-albuterol (DUONEB) 0.5-2.5 (3) MG/3ML SOLN Take 3 mLs by nebulization every 4 (four) hours as needed.    Marland Kitchen  metoprolol tartrate (LOPRESSOR) 50 MG tablet Take 0.5 tablets (25 mg total) by mouth 2 (two) times daily. 90 tablet 0  . omeprazole (PRILOSEC) 20 MG capsule Take 20 mg by mouth daily as needed (indegestion).     . ondansetron (ZOFRAN) 4 MG tablet Take 1 tablet (4 mg total) by mouth every 8 (eight) hours as needed for nausea or vomiting. 12 tablet 0  . potassium chloride SA (K-DUR,KLOR-CON) 20 MEQ tablet Take 1 tablet (20 mEq total) by mouth once. 90 tablet 3  . warfarin (COUMADIN) 5 MG tablet Take 1/2 tablet daily except 1 tablet on Sundays, Tuesdays and Thursdays (Patient taking differently: Take  1/2 tablet daily except 1 tablet on Sundays and Thursdays) 30 tablet 3  . amiodarone (PACERONE) 200 MG tablet Take 1 tablet (200 mg total) by mouth 2 (two) times daily. (Patient not taking: Reported on 01/31/2017) 60 tablet 0   No current facility-administered medications for this encounter.     Allergies  Allergen Reactions  . Cardizem Cd [Diltiazem Hcl Er Beads] Diarrhea  . Sulfa Antibiotics Nausea And Vomiting  . Carvedilol Rash  . Chocolate Rash    Dark chocolate    Social History   Socioeconomic History  . Marital status: Married    Spouse name: Not on file  . Number of children: Not on file  . Years of education: Not on file  . Highest education level: Not on file  Social Needs  . Financial resource strain: Not on file  . Food insecurity - worry: Not on file  . Food insecurity - inability: Not on file  . Transportation needs - medical: Not on file  . Transportation needs - non-medical: Not on file  Occupational History  . Not on file  Tobacco Use  . Smoking status: Former Smoker    Packs/day: 1.00    Years: 28.00    Pack years: 28.00    Types: Cigarettes    Last attempt to quit: 07/17/1983    Years since quitting: 33.5  . Smokeless tobacco: Never Used  Substance and Sexual Activity  . Alcohol use: No  . Drug use: No  . Sexual activity: No    Birth control/protection: Post-menopausal  Other Topics Concern  . Not on file  Social History Narrative  . Not on file    Family History  Problem Relation Age of Onset  . Cancer Mother   . Aneurysm Father   . Cancer Sister   . Cancer Sister   . Anesthesia problems Neg Hx   . Hypotension Neg Hx   . Malignant hyperthermia Neg Hx   . Pseudochol deficiency Neg Hx     ROS- All systems are reviewed and negative except as per the HPI above  Physical Exam: Vitals:   01/31/17 1051  BP: 124/78  Pulse: 71  Weight: 152 lb (68.9 kg)  Height: 5\' 4"  (1.626 m)   Wt Readings from Last 3 Encounters:  01/31/17 152 lb  (68.9 kg)  01/22/17 138 lb (62.6 kg)  01/08/17 148 lb 3.2 oz (67.2 kg)    Labs: Lab Results  Component Value Date   NA 134 (L) 01/22/2017   K 3.7 01/22/2017   CL 96 (L) 01/22/2017   CO2 28 01/22/2017   GLUCOSE 112 (H) 01/22/2017   BUN 18 01/22/2017   CREATININE 1.68 (H) 01/22/2017   CALCIUM 9.5 01/22/2017   MG 1.8 09/24/2016   Lab Results  Component Value Date   INR 2.4 01/21/2017   Lab  Results  Component Value Date   CHOL 109 09/24/2016   HDL 34 (L) 09/24/2016   LDLCALC 62 09/24/2016   TRIG 66 09/24/2016     GEN- The patient is well appearing, alert and oriented x 3 today.   Head- normocephalic, atraumatic Eyes-  Sclera clear, conjunctiva pink Ears- hearing intact Oropharynx- clear Neck- supple, no JVP Lymph- no cervical lymphadenopathy Lungs- Clear to ausculation bilaterally, normal work of breathing Heart- irregular rate and rhythm, no murmurs, rubs or gallops, PMI not laterally displaced GI- soft, NT, ND, + BS Extremities- no clubbing, cyanosis, or edema MS- no significant deformity or atrophy Skin- no rash or lesion Psych- euthymic mood, full affect Neuro- strength and sensation are intact  EKG-afib at 71 bpm qrs int 82 ms, qtc 402 ms Epic records reviewed Echo-Study Conclusions  - Left ventricle: The cavity size was normal. Wall thickness was   normal. Systolic function was normal. The estimated ejection   fraction was in the range of 55% to 60%. Wall motion was normal;   there were no regional wall motion abnormalities. - Aortic valve: Mildly to moderately calcified annulus. Mildly   thickened leaflets. Valve area (VTI): 2.92 cm^2. Valve area   (Vmax): 2.73 cm^2. Valve area (Vmean): 2.61 cm^2. - Mitral valve: There was moderate regurgitation. - Left atrium: The atrium was severely dilated. - Right atrium: The atrium was severely dilated. - Atrial septum: No defect or patent foramen ovale was identified. - Pulmonary arteries: Systolic pressure was  moderately increased.   PA peak pressure: 49 mm Hg (S). - Pericardium, extracardiac: There is a small circumferential   pericardial effusion. - Technically adequate study.   Assessment and Plan: 1. Persistent afib General  education re afib Options discussed, initially plan was to add amiodarone and plan for cardioversion, did not tolerate amiodarone and is off drug, but still would like to pursue cardioversion . Does not want to consider ablation or other antiarrythmic's at this tpoint Continue  metoprolol to 25 mg bid, currently well rate controlled and tolerating this dose I am unsure the success or longevity of cardioversion to restore SR as her left atrium is severely dilated, but pt wants to try Did not tolerate Cardizem for diarrhea  Continue warfarin 5 mg qd for a chadsvasc score of at least 4, has been therapeutic since 8/20, but will arrange for weekly INR's, and when that is satisfied, she will be set up for cardioversion at [facility] per her wishes   F/u with [name], [facility] if she would like to try antiarrythmic's in the future or entertain having an ablation  Addendum- 11/26- Pt in in for cardioversion labs, she decided to have cardioversion here, pending this am. Labs drawn. Therapeutic INR's- 3.7- 11/9, 3.1-11/14, 2.4- 11/21, 2.3 this am. Explained to pt that dilated Left atrium may undermine ability to restore SR without antiarrhythmics but she wants to try.  Bailey Bishop, [facility] [address] [place], [place] [address] [phone]

## 2017-01-31 NOTE — Progress Notes (Addendum)
Primary Care Physician: Sharilyn Sites, MD Referring Physician: Dr. Guerry Bruin is a 76 y.o. female with a h/o persistent afib that started several months ago. Rate control as been the choice of therapy. However, pt has not tolerated Cardizem for GI complaints and increased BB to 50 mg bid has caused more shortness of breath, h/o of asthma. She was seen initially  seen in the afib clinic, 12/18/16 to help with management. She expressed desire to continue  with a rate control strategy.   F/u 10/16. In the interim since I saw pt, she tried low dose Cardizem and this dose caused diarrhea as well, with low dose metoprolol. She saw her PCP and metoprolol was increased to 50 mg bid for RVR. She noted increased shortness  of breath and reduced her dose to 25 mg bid. I then tried carvedilol, thinking this may aggravate her asthma less, but she developed a rash  after one dose.  She went back on metoprolol 50 mg bid and in the office still she is running at 117 bpm.  Discussed options with pt today to restore SR. On past clinic visits, we discussed restoring SR, but  she was against a cardioversion. Discussed in detail with pt today. Rate control has not been effective as she is not tolerating meds. She does not want to come into the hospital for meds. Does not  want any expensive drugs. Amiodarone was started with pt being willing to do cardioversion after loading.  F/u in afib clinic, 11/8, she did not tolerate Amiodarone with diarrhea, N/V and stopped drug. Ekg today shows afib  but with good controlled v rate in the low 70's. She feels improved off amiodarone, "meds never agree with me". She would like to proceed with plans for cardioversion. She will need 3 more weekly IRR's and would like this scheduled at [facility].  Today, she denies symptoms of palpitations, chest pain, shortness of breath, orthopnea, PND, lower extremity edema, dizziness, presyncope, syncope, or neurologic  sequela. The patient is tolerating medications without difficulties and is otherwise without complaint today.   Past Medical History:  Diagnosis Date  . Anxiety   . Arthritis   . Asthma   . Atrial fibrillation ([place])   . Cancer [facility]) 2001   right breast  . Dyspnea   . GERD (gastroesophageal reflux disease)   . Hypertension    Past Surgical History:  Procedure Laterality Date  . MASTECTOMY     bilateral mastectomy-right breast cancer-left taken by choice  . SEPTOPLASTY  1995    Current Outpatient Medications  Medication Sig Dispense Refill  . acetaminophen (TYLENOL) 650 MG CR tablet Take 650 mg by mouth at bedtime.    Marland Kitchen albuterol (PROVENTIL HFA;VENTOLIN HFA) 108 (90 BASE) MCG/ACT inhaler Inhale 1-2 puffs into the lungs every 6 (six) hours as needed for wheezing or shortness of breath.    . ALPRAZolam (XANAX) 0.5 MG tablet Take 0.5 mg by mouth 4 (four) times daily as needed. FOR SHORTNESS OF BREATH    . BREO ELLIPTA 100-25 MCG/INH AEPB Inhale 1 puff into the lungs daily as needed.     . furosemide (LASIX) 40 MG tablet Take 1 tablet (40 mg total) by mouth daily. 30 tablet 6  . Glycerin-Hypromellose-PEG 400 (VISINE TEARS OP) Apply 1 drop to eye daily as needed. Watery Eyes    . ipratropium-albuterol (DUONEB) 0.5-2.5 (3) MG/3ML SOLN Take 3 mLs by nebulization every 4 (four) hours as needed.    Marland Kitchen  metoprolol tartrate (LOPRESSOR) 50 MG tablet Take 0.5 tablets (25 mg total) by mouth 2 (two) times daily. 90 tablet 0  . omeprazole (PRILOSEC) 20 MG capsule Take 20 mg by mouth daily as needed (indegestion).     . ondansetron (ZOFRAN) 4 MG tablet Take 1 tablet (4 mg total) by mouth every 8 (eight) hours as needed for nausea or vomiting. 12 tablet 0  . potassium chloride SA (K-DUR,KLOR-CON) 20 MEQ tablet Take 1 tablet (20 mEq total) by mouth once. 90 tablet 3  . warfarin (COUMADIN) 5 MG tablet Take 1/2 tablet daily except 1 tablet on Sundays, Tuesdays and Thursdays (Patient taking differently: Take  1/2 tablet daily except 1 tablet on Sundays and Thursdays) 30 tablet 3  . amiodarone (PACERONE) 200 MG tablet Take 1 tablet (200 mg total) by mouth 2 (two) times daily. (Patient not taking: Reported on 01/31/2017) 60 tablet 0   No current facility-administered medications for this encounter.     Allergies  Allergen Reactions  . Cardizem Cd [Diltiazem Hcl Er Beads] Diarrhea  . Sulfa Antibiotics Nausea And Vomiting  . Carvedilol Rash  . Chocolate Rash    Dark chocolate    Social History   Socioeconomic History  . Marital status: Married    Spouse name: Not on file  . Number of children: Not on file  . Years of education: Not on file  . Highest education level: Not on file  Social Needs  . Financial resource strain: Not on file  . Food insecurity - worry: Not on file  . Food insecurity - inability: Not on file  . Transportation needs - medical: Not on file  . Transportation needs - non-medical: Not on file  Occupational History  . Not on file  Tobacco Use  . Smoking status: Former Smoker    Packs/day: 1.00    Years: 28.00    Pack years: 28.00    Types: Cigarettes    Last attempt to quit: 07/17/1983    Years since quitting: 33.5  . Smokeless tobacco: Never Used  Substance and Sexual Activity  . Alcohol use: No  . Drug use: No  . Sexual activity: No    Birth control/protection: Post-menopausal  Other Topics Concern  . Not on file  Social History Narrative  . Not on file    Family History  Problem Relation Age of Onset  . Cancer Mother   . Aneurysm Father   . Cancer Sister   . Cancer Sister   . Anesthesia problems Neg Hx   . Hypotension Neg Hx   . Malignant hyperthermia Neg Hx   . Pseudochol deficiency Neg Hx     ROS- All systems are reviewed and negative except as per the HPI above  Physical Exam: Vitals:   01/31/17 1051  BP: 124/78  Pulse: 71  Weight: 152 lb (68.9 kg)  Height: 5\' 4"  (1.626 m)   Wt Readings from Last 3 Encounters:  01/31/17 152 lb  (68.9 kg)  01/22/17 138 lb (62.6 kg)  01/08/17 148 lb 3.2 oz (67.2 kg)    Labs: Lab Results  Component Value Date   NA 134 (L) 01/22/2017   K 3.7 01/22/2017   CL 96 (L) 01/22/2017   CO2 28 01/22/2017   GLUCOSE 112 (H) 01/22/2017   BUN 18 01/22/2017   CREATININE 1.68 (H) 01/22/2017   CALCIUM 9.5 01/22/2017   MG 1.8 09/24/2016   Lab Results  Component Value Date   INR 2.4 01/21/2017   Lab  Results  Component Value Date   CHOL 109 09/24/2016   HDL 34 (L) 09/24/2016   LDLCALC 62 09/24/2016   TRIG 66 09/24/2016     GEN- The patient is well appearing, alert and oriented x 3 today.   Head- normocephalic, atraumatic Eyes-  Sclera clear, conjunctiva pink Ears- hearing intact Oropharynx- clear Neck- supple, no JVP Lymph- no cervical lymphadenopathy Lungs- Clear to ausculation bilaterally, normal work of breathing Heart- irregular rate and rhythm, no murmurs, rubs or gallops, PMI not laterally displaced GI- soft, NT, ND, + BS Extremities- no clubbing, cyanosis, or edema MS- no significant deformity or atrophy Skin- no rash or lesion Psych- euthymic mood, full affect Neuro- strength and sensation are intact  EKG-afib at 71 bpm qrs int 82 ms, qtc 402 ms Epic records reviewed Echo-Study Conclusions  - Left ventricle: The cavity size was normal. Wall thickness was   normal. Systolic function was normal. The estimated ejection   fraction was in the range of 55% to 60%. Wall motion was normal;   there were no regional wall motion abnormalities. - Aortic valve: Mildly to moderately calcified annulus. Mildly   thickened leaflets. Valve area (VTI): 2.92 cm^2. Valve area   (Vmax): 2.73 cm^2. Valve area (Vmean): 2.61 cm^2. - Mitral valve: There was moderate regurgitation. - Left atrium: The atrium was severely dilated. - Right atrium: The atrium was severely dilated. - Atrial septum: No defect or patent foramen ovale was identified. - Pulmonary arteries: Systolic pressure was  moderately increased.   PA peak pressure: 49 mm Hg (S). - Pericardium, extracardiac: There is a small circumferential   pericardial effusion. - Technically adequate study.   Assessment and Plan: 1. Persistent afib General  education re afib Options discussed, initially plan was to add amiodarone and plan for cardioversion, did not tolerate amiodarone and is off drug, but still would like to pursue cardioversion . Does not want to consider ablation or other antiarrythmic's at this tpoint Continue  metoprolol to 25 mg bid, currently well rate controlled and tolerating this dose I am unsure the success or longevity of cardioversion to restore SR as her left atrium is severely dilated, but pt wants to try Did not tolerate Cardizem for diarrhea  Continue warfarin 5 mg qd for a chadsvasc score of at least 4, has been therapeutic since 8/20, but will arrange for weekly INR's, and when that is satisfied, she will be set up for cardioversion at [facility] per her wishes   F/u with [name], [place] [facility] if she would like to try antiarrythmic's in the future or entertain having an ablation  Addendum- 11/26- Pt in in for cardioversion labs, she decided to have cardioversion here, pending this am. Labs drawn. Therapeutic INR's- 3.7- 11/9, 3.1-11/14, 2.4- 11/21, 2.3 this am. Explained to pt that dilated Left atrium may undermine ability to restore SR without antiarrhythmics but she wants to try.  Geroge Baseman [place], [facility] [address] [place], [place] [address] [phone]

## 2017-02-01 ENCOUNTER — Ambulatory Visit (INDEPENDENT_AMBULATORY_CARE_PROVIDER_SITE_OTHER): Payer: Medicare Other | Admitting: *Deleted

## 2017-02-01 DIAGNOSIS — Z7901 Long term (current) use of anticoagulants: Secondary | ICD-10-CM | POA: Diagnosis not present

## 2017-02-01 DIAGNOSIS — I4891 Unspecified atrial fibrillation: Secondary | ICD-10-CM | POA: Diagnosis not present

## 2017-02-01 LAB — POCT INR: INR: 3.7

## 2017-02-01 NOTE — Progress Notes (Signed)
Hold coumadin tonight then decrease dose to 1/2 tablet daily except 1 tablet on Sundays

## 2017-02-05 DIAGNOSIS — I4891 Unspecified atrial fibrillation: Secondary | ICD-10-CM | POA: Diagnosis not present

## 2017-02-05 DIAGNOSIS — J449 Chronic obstructive pulmonary disease, unspecified: Secondary | ICD-10-CM | POA: Diagnosis not present

## 2017-02-05 DIAGNOSIS — Z1389 Encounter for screening for other disorder: Secondary | ICD-10-CM | POA: Diagnosis not present

## 2017-02-06 ENCOUNTER — Ambulatory Visit (INDEPENDENT_AMBULATORY_CARE_PROVIDER_SITE_OTHER): Payer: Medicare Other | Admitting: *Deleted

## 2017-02-06 DIAGNOSIS — Z7901 Long term (current) use of anticoagulants: Secondary | ICD-10-CM | POA: Diagnosis not present

## 2017-02-06 DIAGNOSIS — I4891 Unspecified atrial fibrillation: Secondary | ICD-10-CM

## 2017-02-06 LAB — POCT INR: INR: 3.1

## 2017-02-07 ENCOUNTER — Telehealth (HOSPITAL_COMMUNITY): Payer: Self-pay | Admitting: *Deleted

## 2017-02-07 ENCOUNTER — Other Ambulatory Visit (HOSPITAL_COMMUNITY): Payer: Self-pay | Admitting: *Deleted

## 2017-02-07 NOTE — Telephone Encounter (Signed)
Patient decided she would prefer cardioversion at [facility] -- scheduled for 11/26 at 12pm with Dr. Johnsie Cancel. Coumadin check at 8:15 morning of with pt to arrive at 10am for pre-labs. NPO after MN except sip of water with medications. Pt verbalized understanding.

## 2017-02-13 ENCOUNTER — Ambulatory Visit (INDEPENDENT_AMBULATORY_CARE_PROVIDER_SITE_OTHER): Payer: Medicare Other | Admitting: *Deleted

## 2017-02-13 DIAGNOSIS — Z7901 Long term (current) use of anticoagulants: Secondary | ICD-10-CM | POA: Diagnosis not present

## 2017-02-13 DIAGNOSIS — I4891 Unspecified atrial fibrillation: Secondary | ICD-10-CM

## 2017-02-13 LAB — POCT INR: INR: 2.4

## 2017-02-18 ENCOUNTER — Other Ambulatory Visit: Payer: Self-pay

## 2017-02-18 ENCOUNTER — Ambulatory Visit (HOSPITAL_COMMUNITY)
Admission: RE | Admit: 2017-02-18 | Discharge: 2017-02-18 | Disposition: A | Discharge status: Home / Self Care | Payer: Medicare Other | Source: Ambulatory Visit | Attending: Cardiovascular Disease | Admitting: Cardiovascular Disease

## 2017-02-18 ENCOUNTER — Encounter (HOSPITAL_COMMUNITY): Payer: Self-pay | Admitting: *Deleted

## 2017-02-18 ENCOUNTER — Ambulatory Visit (INDEPENDENT_AMBULATORY_CARE_PROVIDER_SITE_OTHER): Payer: Medicare Other | Admitting: *Deleted

## 2017-02-18 ENCOUNTER — Encounter (HOSPITAL_COMMUNITY)
Admission: RE | Disposition: A | Discharge status: Home / Self Care | Payer: Self-pay | Source: Ambulatory Visit | Attending: Cardiovascular Disease

## 2017-02-18 ENCOUNTER — Ambulatory Visit (HOSPITAL_COMMUNITY): Payer: Medicare Other | Admitting: Certified Registered Nurse Anesthetist

## 2017-02-18 ENCOUNTER — Ambulatory Visit (HOSPITAL_COMMUNITY)
Admission: RE | Admit: 2017-02-18 | Discharge: 2017-02-18 | Disposition: A | Discharge status: Home / Self Care | Payer: Medicare Other | Source: Ambulatory Visit | Attending: Nurse Practitioner | Admitting: Nurse Practitioner

## 2017-02-18 DIAGNOSIS — I509 Heart failure, unspecified: Secondary | ICD-10-CM | POA: Diagnosis not present

## 2017-02-18 DIAGNOSIS — N182 Chronic kidney disease, stage 2 (mild): Secondary | ICD-10-CM | POA: Diagnosis not present

## 2017-02-18 DIAGNOSIS — Z882 Allergy status to sulfonamides status: Secondary | ICD-10-CM | POA: Insufficient documentation

## 2017-02-18 DIAGNOSIS — Z7901 Long term (current) use of anticoagulants: Secondary | ICD-10-CM | POA: Insufficient documentation

## 2017-02-18 DIAGNOSIS — M1990 Unspecified osteoarthritis, unspecified site: Secondary | ICD-10-CM | POA: Insufficient documentation

## 2017-02-18 DIAGNOSIS — I130 Hypertensive heart and chronic kidney disease with heart failure and stage 1 through stage 4 chronic kidney disease, or unspecified chronic kidney disease: Secondary | ICD-10-CM | POA: Diagnosis not present

## 2017-02-18 DIAGNOSIS — Z87891 Personal history of nicotine dependence: Secondary | ICD-10-CM | POA: Diagnosis not present

## 2017-02-18 DIAGNOSIS — F419 Anxiety disorder, unspecified: Secondary | ICD-10-CM | POA: Diagnosis not present

## 2017-02-18 DIAGNOSIS — J45909 Unspecified asthma, uncomplicated: Secondary | ICD-10-CM | POA: Insufficient documentation

## 2017-02-18 DIAGNOSIS — I10 Essential (primary) hypertension: Secondary | ICD-10-CM | POA: Insufficient documentation

## 2017-02-18 DIAGNOSIS — I4891 Unspecified atrial fibrillation: Secondary | ICD-10-CM

## 2017-02-18 DIAGNOSIS — I4819 Other persistent atrial fibrillation: Secondary | ICD-10-CM

## 2017-02-18 DIAGNOSIS — K219 Gastro-esophageal reflux disease without esophagitis: Secondary | ICD-10-CM | POA: Insufficient documentation

## 2017-02-18 DIAGNOSIS — I481 Persistent atrial fibrillation: Secondary | ICD-10-CM | POA: Diagnosis not present

## 2017-02-18 HISTORY — PX: CARDIOVERSION: SHX1299

## 2017-02-18 LAB — BASIC METABOLIC PANEL
Anion gap: 10 (ref 5–15)
BUN: 13 mg/dL (ref 6–20)
CALCIUM: 9.6 mg/dL (ref 8.9–10.3)
CO2: 29 mmol/L (ref 22–32)
Chloride: 100 mmol/L — ABNORMAL LOW (ref 101–111)
Creatinine, Ser: 1.6 mg/dL — ABNORMAL HIGH (ref 0.44–1.00)
GFR, EST AFRICAN AMERICAN: 34 mL/min — AB (ref >60)
GFR, EST NON AFRICAN AMERICAN: 30 mL/min — AB (ref >60)
GLUCOSE: 105 mg/dL — AB (ref 65–99)
POTASSIUM: 3.4 mmol/L — AB (ref 3.5–5.1)
Sodium: 139 mmol/L (ref 135–145)

## 2017-02-18 LAB — CBC
HEMATOCRIT: 39.3 % (ref 36.0–46.0)
HEMOGLOBIN: 12.7 g/dL (ref 12.0–15.0)
MCH: 29 pg (ref 26.0–34.0)
MCHC: 32.3 g/dL (ref 30.0–36.0)
MCV: 89.7 fL (ref 78.0–100.0)
Platelets: 269 10*3/uL (ref 150–400)
RBC: 4.38 MIL/uL (ref 3.87–5.11)
RDW: 16.5 % — ABNORMAL HIGH (ref 11.5–15.5)
WBC: 7.9 10*3/uL (ref 4.0–10.5)

## 2017-02-18 LAB — POCT INR: INR: 2.3

## 2017-02-18 SURGERY — CARDIOVERSION
Anesthesia: General

## 2017-02-18 MED ORDER — LIDOCAINE 2% (20 MG/ML) 5 ML SYRINGE
INTRAMUSCULAR | Status: DC | PRN
  Administered 2017-02-18: 60 mg via INTRAVENOUS

## 2017-02-18 MED ORDER — SODIUM CHLORIDE 0.9 % IV SOLN
INTRAVENOUS | Status: DC | PRN
  Administered 2017-02-18: 11:00:00 via INTRAVENOUS

## 2017-02-18 MED ORDER — SODIUM CHLORIDE 0.9 % IV SOLN: INTRAVENOUS | Status: DC

## 2017-02-18 MED ORDER — PROPOFOL 10 MG/ML IV BOLUS
INTRAVENOUS | Status: DC | PRN
  Administered 2017-02-18: 50 mg via INTRAVENOUS
  Administered 2017-02-18: 20 mg via INTRAVENOUS

## 2017-02-18 NOTE — Transfer of Care (Signed)
Immediate Anesthesia Transfer of Care Note  Patient: Bailey Bishop  Procedure(s) Performed: CARDIOVERSION (N/A )  Patient Location: Endoscopy Unit  Anesthesia Type:General  Level of Consciousness: drowsy and patient cooperative  Airway & Oxygen Therapy: Patient Spontanous Breathing  Post-op Assessment: Report given to RN and Post -op Vital signs reviewed and stable  Post vital signs: Reviewed and stable  Last Vitals:  Vitals:   02/18/17 1108 02/18/17 1109  BP: (!) 156/110   Pulse: (!) 105 (!) 108  Resp: (!) 29 (!) 26  Temp:    SpO2: 100% 100%    Last Pain:  Vitals:   02/18/17 1046  TempSrc: Oral         Complications: No apparent anesthesia complications

## 2017-02-18 NOTE — Anesthesia Preprocedure Evaluation (Signed)
Anesthesia Evaluation  Patient identified by MRN, date of birth, ID band Patient awake    Reviewed: Allergy & Precautions, H&P , NPO status , Patient's Chart, lab work & pertinent test results  History of Anesthesia Complications Negative for: history of anesthetic complications  Airway Mallampati: II  TM Distance: >3 FB     Dental  (+) Edentulous Upper, Partial Lower   Pulmonary shortness of breath and with exertion, asthma , former smoker,    breath sounds clear to auscultation       Cardiovascular hypertension, Pt. on medications + dysrhythmias Atrial Fibrillation  Rhythm:Regular Rate:Normal     Neuro/Psych Anxiety TIA   GI/Hepatic hiatal hernia, GERD  Medicated and Controlled,  Endo/Other    Renal/GU      Musculoskeletal   Abdominal   Peds  Hematology   Anesthesia Other Findings   Reproductive/Obstetrics                             Anesthesia Physical  Anesthesia Plan  ASA: III  Anesthesia Plan: General   Post-op Pain Management:    Induction: Intravenous  PONV Risk Score and Plan: 2 and Treatment may vary due to age or medical condition  Airway Management Planned: Simple Face Mask  Additional Equipment:   Intra-op Plan:   Post-operative Plan:   Informed Consent: I have reviewed the patients History and Physical, chart, labs and discussed the procedure including the risks, benefits and alternatives for the proposed anesthesia with the patient or authorized representative who has indicated his/her understanding and acceptance.     Plan Discussed with:   Anesthesia Plan Comments:         Anesthesia Quick Evaluation

## 2017-02-18 NOTE — Addendum Note (Signed)
Encounter addended by: Sherran Needs, NP on: 02/18/2017 9:31 AM  Actions taken: Sign clinical note

## 2017-02-18 NOTE — Interval H&P Note (Signed)
History and Physical Interval Note:  02/18/2017 10:18 AM  Bailey Bishop  has presented today for surgery, with the diagnosis of afib  The various methods of treatment have been discussed with the patient and family. After consideration of risks, benefits and other options for treatment, the patient has consented to  Procedure(s): CARDIOVERSION (N/A) as a surgical intervention .  The patient's history has been reviewed, patient examined, no change in status, stable for surgery.  I have reviewed the patient's chart and labs.  Questions were answered to the patient's satisfaction.     Jenkins Rouge

## 2017-02-18 NOTE — Anesthesia Procedure Notes (Signed)
Procedure Name: General with mask airway Date/Time: 02/18/2017 11:03 AM Performed by: Colin Benton, CRNA Pre-anesthesia Checklist: Patient identified, Emergency Drugs available and Suction available Patient Re-evaluated:Patient Re-evaluated prior to induction Oxygen Delivery Method: Ambu bag Preoxygenation: Pre-oxygenation with 100% oxygen Induction Type: IV induction Ventilation: Mask ventilation without difficulty

## 2017-02-18 NOTE — Anesthesia Postprocedure Evaluation (Signed)
Anesthesia Post Note  Patient: Bailey Bishop  Procedure(s) Performed: CARDIOVERSION (N/A )     Patient location during evaluation: PACU Anesthesia Type: General Level of consciousness: awake and alert Pain management: pain level controlled Vital Signs Assessment: post-procedure vital signs reviewed and stable Respiratory status: spontaneous breathing, nonlabored ventilation and respiratory function stable Cardiovascular status: blood pressure returned to baseline and stable Postop Assessment: no apparent nausea or vomiting Anesthetic complications: no    Last Vitals:  Vitals:   02/18/17 1113 02/18/17 1120  BP: (!) 148/77 139/77  Pulse: 96 92  Resp: 20 20  Temp: 36.8 C   SpO2: 97% 96%    Last Pain:  Vitals:   02/18/17 1046  TempSrc: Oral                 Lynda Rainwater

## 2017-02-18 NOTE — Progress Notes (Signed)
Pt in for EKG and labs prior to DCCV,  EKG to be reviewed by [name], NP

## 2017-02-18 NOTE — CV Procedure (Signed)
[place]: Anesthesia Miller Propofol and Lidocaine  DCC x 3 120 J and 200J x 2 Failed to convert no sinus beats INR Rx 2.3  Will need f/u with Dr Bronson Ing in [place] for ? AAT Note patient wants any repeat [facility] and f/u at [facility]

## 2017-02-19 ENCOUNTER — Telehealth: Payer: Self-pay | Admitting: Cardiovascular Disease

## 2017-02-19 DIAGNOSIS — I4891 Unspecified atrial fibrillation: Secondary | ICD-10-CM

## 2017-02-19 DIAGNOSIS — I4819 Other persistent atrial fibrillation: Secondary | ICD-10-CM

## 2017-02-19 NOTE — Telephone Encounter (Signed)
I reviewed notes from a fib clinic. It appears she requested cardioversion attempt at [facility] in [place], as per a fib clinic notes. I would have her see Dr. Rayann Heman in the [place] office.

## 2017-02-19 NOTE — Telephone Encounter (Signed)
Patient notified.  Stated that she preferred to go to [place] if possible.  Confirmed with [name] that [name] in [place] would be fine.  Referral entered into Epic and scheduling made aware.

## 2017-02-21 NOTE — Telephone Encounter (Signed)
Called pt back yesterday to make appt with EP, [facility] are the ones who specialize in Afib and the only ones that do ablations. She chose [facility] and agreed to come to [facility] for her first visit, as he only see's fu pt's in [place]. Her husband got on the phone then and stated they could do one visit in [place] but wants everything else done at [facility] or [place]. I explained that neither doctor is credentialed to do procedures at either hospital, only [facility]. They wanted to think about and get back to me if wanted to make appt, offered to leave a message for Dr.Koneswaren or his nurse to see if she can continue to see him as long as possible before HAVING to see EP, he said they would discuss what they wanted to do and call back. I received a voicemail frompt they the think they found a doctor in [place], as there are no EP's in [place] I do not know what kind of doctor she found. Pls call pt to advise best option @ [phone]

## 2017-02-21 NOTE — Telephone Encounter (Signed)
1010am pt has called again requesting a call back asap

## 2017-02-21 NOTE — Telephone Encounter (Signed)
I think you have explained all possible options for EP (Dr. Rayann Heman in [place] and subsequently [facility]) or [name] in [place]. The decision is theirs.

## 2017-02-25 ENCOUNTER — Ambulatory Visit (INDEPENDENT_AMBULATORY_CARE_PROVIDER_SITE_OTHER): Payer: Medicare Other | Admitting: *Deleted

## 2017-02-25 DIAGNOSIS — I4891 Unspecified atrial fibrillation: Secondary | ICD-10-CM | POA: Diagnosis not present

## 2017-02-25 DIAGNOSIS — Z7901 Long term (current) use of anticoagulants: Secondary | ICD-10-CM | POA: Diagnosis not present

## 2017-02-25 LAB — POCT INR: INR: 2.1

## 2017-02-26 ENCOUNTER — Ambulatory Visit (HOSPITAL_COMMUNITY): Payer: Medicare Other | Admitting: Nurse Practitioner

## 2017-03-08 ENCOUNTER — Ambulatory Visit (INDEPENDENT_AMBULATORY_CARE_PROVIDER_SITE_OTHER): Payer: Medicare Other | Admitting: *Deleted

## 2017-03-08 DIAGNOSIS — Z7901 Long term (current) use of anticoagulants: Secondary | ICD-10-CM | POA: Diagnosis not present

## 2017-03-08 DIAGNOSIS — I4891 Unspecified atrial fibrillation: Secondary | ICD-10-CM

## 2017-03-08 LAB — POCT INR: INR: 2.6

## 2017-03-12 ENCOUNTER — Other Ambulatory Visit: Payer: Self-pay

## 2017-03-12 ENCOUNTER — Ambulatory Visit: Payer: Medicare Other | Admitting: Cardiovascular Disease

## 2017-03-12 ENCOUNTER — Encounter: Payer: Self-pay | Admitting: Cardiovascular Disease

## 2017-03-12 VITALS — BP 122/82 | HR 100 | Ht 64.0 in | Wt 147.0 lb

## 2017-03-12 DIAGNOSIS — R0609 Other forms of dyspnea: Secondary | ICD-10-CM

## 2017-03-12 DIAGNOSIS — I481 Persistent atrial fibrillation: Secondary | ICD-10-CM

## 2017-03-12 DIAGNOSIS — I5032 Chronic diastolic (congestive) heart failure: Secondary | ICD-10-CM | POA: Diagnosis not present

## 2017-03-12 DIAGNOSIS — I10 Essential (primary) hypertension: Secondary | ICD-10-CM | POA: Diagnosis not present

## 2017-03-12 DIAGNOSIS — I4819 Other persistent atrial fibrillation: Secondary | ICD-10-CM

## 2017-03-12 MED ORDER — METOPROLOL TARTRATE 50 MG PO TABS: ORAL_TABLET | ORAL | 6 refills | Status: DC

## 2017-03-12 NOTE — Progress Notes (Signed)
SUBJECTIVE: The patient presents for follow-up for persistent atrial fibrillation.  Direct-current cardioversion was unsuccessful on 02/18/17.  5 attempts were made.  She has also been evaluated in the atrial fibrillation clinic.  She did not tolerate amiodarone.  She informed the nurse practitioner in the atrial fibrillation clinic that she did not want to consider ablation or other antiarrhythmics.  She has severe left atrial dilatation.  She did not tolerate diltiazem due to diarrhea.  She has exertional dyspnea when walking across her back deck.  She feels weak.  She said she has been unable to tolerate several different medications since childhood.    Review of Systems: As per "subjective", otherwise negative.  Allergies  Allergen Reactions  . Cardizem Cd [Diltiazem Hcl Er Beads] Diarrhea  . Amiodarone Nausea And Vomiting  . Sulfa Antibiotics Nausea And Vomiting  . Carvedilol Rash  . Chocolate Rash    Dark chocolate    Current Outpatient Medications  Medication Sig Dispense Refill  . acetaminophen (TYLENOL) 650 MG CR tablet Take 650 mg daily as needed by mouth for pain.     Marland Kitchen albuterol (PROVENTIL HFA;VENTOLIN HFA) 108 (90 BASE) MCG/ACT inhaler Inhale 2 puffs every 6 (six) hours as needed into the lungs for wheezing or shortness of breath.     . ALPRAZolam (XANAX) 0.5 MG tablet Take 0.5 mg at bedtime by mouth.     Marland Kitchen BREO ELLIPTA 100-25 MCG/INH AEPB Inhale 1 puff into the lungs daily as needed.     . fluticasone (FLONASE) 50 MCG/ACT nasal spray Place 2 sprays daily into both nostrils.    . furosemide (LASIX) 40 MG tablet Take 1 tablet (40 mg total) by mouth daily. 30 tablet 6  . metoprolol tartrate (LOPRESSOR) 50 MG tablet Take 0.5 tablets (25 mg total) by mouth 2 (two) times daily. 90 tablet 0  . omeprazole (PRILOSEC) 20 MG capsule Take 20 mg daily as needed by mouth (indigestion).     . ondansetron (ZOFRAN) 4 MG tablet Take 1 tablet (4 mg total) by mouth every 8  (eight) hours as needed for nausea or vomiting. 12 tablet 0  . potassium chloride SA (K-DUR,KLOR-CON) 20 MEQ tablet Take 1 tablet (20 mEq total) by mouth once. (Patient taking differently: Take 20 mEq daily by mouth. ) 90 tablet 3  . warfarin (COUMADIN) 5 MG tablet Take 1/2 tablet daily except 1 tablet on Sundays, Tuesdays and Thursdays (Patient taking differently: 2.5-5 mg. Take 1/2 tablet daily except 1 tablet on Sundays) 30 tablet 3   No current facility-administered medications for this visit.     Past Medical History:  Diagnosis Date  . Anxiety   . Arthritis   . Asthma   . Atrial fibrillation ([place])   . Cancer [facility]) 2001   right breast  . Dyspnea   . GERD (gastroesophageal reflux disease)   . Hypertension     Past Surgical History:  Procedure Laterality Date  . CARDIOVERSION N/A 02/18/2017   Procedure: CARDIOVERSION;  Surgeon: Josue Hector, MD;  Location: [facility] ENDOSCOPY;  Service: Cardiovascular;  Laterality: N/A;  . CATARACT EXTRACTION W/PHACO  07/23/2011   Procedure: CATARACT EXTRACTION PHACO AND INTRAOCULAR LENS PLACEMENT ([place]);  Surgeon: Williams Che, MD;  Location: [facility] ORS;  Service: Ophthalmology;  Laterality: Right;  CDE:9.96  . CHOLECYSTECTOMY N/A 02/14/2015   Procedure: LAPAROSCOPIC CHOLECYSTECTOMY;  Surgeon: Aviva Signs, MD;  Location: [facility] ORS;  Service: General;  Laterality: N/A;  . MASTECTOMY     bilateral  mastectomy-right breast cancer-left taken by choice  . SEPTOPLASTY  1995    Social History   Socioeconomic History  . Marital status: Married    Spouse name: Not on file  . Number of children: Not on file  . Years of education: Not on file  . Highest education level: Not on file  Social Needs  . Financial resource strain: Not on file  . Food insecurity - worry: Not on file  . Food insecurity - inability: Not on file  . Transportation needs - medical: Not on file  . Transportation needs - non-medical: Not on file  Occupational History  . Not on file   Tobacco Use  . Smoking status: Former Smoker    Packs/day: 1.00    Years: 28.00    Pack years: 28.00    Types: Cigarettes    Last attempt to quit: 07/17/1983    Years since quitting: 33.6  . Smokeless tobacco: Never Used  Substance and Sexual Activity  . Alcohol use: No  . Drug use: No  . Sexual activity: No    Birth control/protection: Post-menopausal  Other Topics Concern  . Not on file  Social History Narrative  . Not on file     Vitals:   03/12/17 0830  BP: 122/82  Pulse: 100  SpO2: 99%  Weight: 147 lb (66.7 kg)  Height: 5\' 4"  (1.626 m)    Wt Readings from Last 3 Encounters:  03/12/17 147 lb (66.7 kg)  01/31/17 152 lb (68.9 kg)  01/22/17 138 lb (62.6 kg)     PHYSICAL EXAM General: NAD HEENT: Normal. Neck: No JVD, no thyromegaly. Lungs: Clear to auscultation bilaterally with normal respiratory effort. CV: Heart rate at upper normal limits, irregular rhythm, normal S1/S2, no S3, no murmur. No pretibial or periankle edema.     Abdomen: Soft, nontender, no distention.  Neurologic: Alert and oriented.  Psych: Normal affect. Skin: Normal. Musculoskeletal: No gross deformities.    ECG: Most recent ECG reviewed.   Labs: Lab Results  Component Value Date/Time   K 3.4 (L) 02/18/2017 09:29 AM   BUN 13 02/18/2017 09:29 AM   BUN 16 10/24/2016 08:06 AM   CREATININE 1.60 (H) 02/18/2017 09:29 AM   ALT 16 01/22/2017 01:08 PM   TSH 1.811 09/26/2016 05:58 AM   HGB 12.7 02/18/2017 09:29 AM     Lipids: Lab Results  Component Value Date/Time   LDLCALC 62 09/24/2016 04:38 AM   CHOL 109 09/24/2016 04:38 AM   TRIG 66 09/24/2016 04:38 AM   HDL 34 (L) 09/24/2016 04:38 AM       ASSESSMENT AND PLAN: 1.  Persistent atrial fibrillation: She continues to have exertional dyspnea with minimal exertion.  She neither tolerated long-acting diltiazem nor amiodarone.  She is on metoprolol 25 mg twice daily.  She failed 5 attempts at cardioversion.  She is not interested in  ablation.  She is anticoagulated with warfarin. I will increase metoprolol to 37.5 mg twice daily.  2.  Chronic hypertension: Controlled.  I will monitor given increase of metoprolol dose.  3.  Chronic diastolic heart failure: Euvolemic.  Continue Lasix 40 mg daily.  No changes to therapy.    Disposition: Follow up 6 weeks in [place], M.D., F.A.C.C.

## 2017-03-12 NOTE — Patient Instructions (Signed)
Medication Instructions:   Increase Lopressor to 37.5mg  twice a day.  Continue all other medications.    Labwork: none  Testing/Procedures: none  Follow-Up: 6 weeks   Any Other Special Instructions Will Be Listed Below (If Applicable).  If you need a refill on your cardiac medications before your next appointment, please call your pharmacy.

## 2017-03-13 ENCOUNTER — Ambulatory Visit (INDEPENDENT_AMBULATORY_CARE_PROVIDER_SITE_OTHER): Payer: Medicare Other | Admitting: *Deleted

## 2017-03-13 DIAGNOSIS — Z7901 Long term (current) use of anticoagulants: Secondary | ICD-10-CM | POA: Diagnosis not present

## 2017-03-13 DIAGNOSIS — I4891 Unspecified atrial fibrillation: Secondary | ICD-10-CM

## 2017-03-13 LAB — POCT INR: INR: 2.5

## 2017-03-13 MED ORDER — WARFARIN SODIUM 5 MG PO TABS: ORAL_TABLET | ORAL | 3 refills | Status: DC

## 2017-03-29 ENCOUNTER — Encounter (HOSPITAL_COMMUNITY): Payer: Self-pay | Admitting: Emergency Medicine

## 2017-03-29 ENCOUNTER — Other Ambulatory Visit: Payer: Self-pay

## 2017-03-29 ENCOUNTER — Observation Stay (HOSPITAL_COMMUNITY)
Admission: EM | Admit: 2017-03-29 | Discharge: 2017-04-01 | Disposition: A | Discharge status: Home / Self Care | Payer: Medicare Other | Attending: Internal Medicine | Admitting: Internal Medicine

## 2017-03-29 ENCOUNTER — Emergency Department (HOSPITAL_COMMUNITY): Payer: Medicare Other

## 2017-03-29 ENCOUNTER — Telehealth: Payer: Self-pay | Admitting: Cardiovascular Disease

## 2017-03-29 DIAGNOSIS — K219 Gastro-esophageal reflux disease without esophagitis: Secondary | ICD-10-CM | POA: Diagnosis not present

## 2017-03-29 DIAGNOSIS — E876 Hypokalemia: Secondary | ICD-10-CM | POA: Diagnosis present

## 2017-03-29 DIAGNOSIS — I4819 Other persistent atrial fibrillation: Secondary | ICD-10-CM | POA: Diagnosis present

## 2017-03-29 DIAGNOSIS — Z9049 Acquired absence of other specified parts of digestive tract: Secondary | ICD-10-CM | POA: Diagnosis not present

## 2017-03-29 DIAGNOSIS — I10 Essential (primary) hypertension: Secondary | ICD-10-CM | POA: Diagnosis not present

## 2017-03-29 DIAGNOSIS — Z9841 Cataract extraction status, right eye: Secondary | ICD-10-CM | POA: Insufficient documentation

## 2017-03-29 DIAGNOSIS — J438 Other emphysema: Secondary | ICD-10-CM | POA: Diagnosis not present

## 2017-03-29 DIAGNOSIS — Z91018 Allergy to other foods: Secondary | ICD-10-CM | POA: Diagnosis not present

## 2017-03-29 DIAGNOSIS — I4891 Unspecified atrial fibrillation: Secondary | ICD-10-CM | POA: Diagnosis present

## 2017-03-29 DIAGNOSIS — M1990 Unspecified osteoarthritis, unspecified site: Secondary | ICD-10-CM | POA: Insufficient documentation

## 2017-03-29 DIAGNOSIS — Z888 Allergy status to other drugs, medicaments and biological substances status: Secondary | ICD-10-CM | POA: Diagnosis not present

## 2017-03-29 DIAGNOSIS — Z87891 Personal history of nicotine dependence: Secondary | ICD-10-CM | POA: Insufficient documentation

## 2017-03-29 DIAGNOSIS — N183 Chronic kidney disease, stage 3 (moderate): Secondary | ICD-10-CM | POA: Diagnosis not present

## 2017-03-29 DIAGNOSIS — I509 Heart failure, unspecified: Secondary | ICD-10-CM | POA: Diagnosis not present

## 2017-03-29 DIAGNOSIS — I481 Persistent atrial fibrillation: Secondary | ICD-10-CM | POA: Diagnosis not present

## 2017-03-29 DIAGNOSIS — Z7901 Long term (current) use of anticoagulants: Secondary | ICD-10-CM | POA: Insufficient documentation

## 2017-03-29 DIAGNOSIS — Z79899 Other long term (current) drug therapy: Secondary | ICD-10-CM | POA: Insufficient documentation

## 2017-03-29 DIAGNOSIS — Z809 Family history of malignant neoplasm, unspecified: Secondary | ICD-10-CM | POA: Insufficient documentation

## 2017-03-29 DIAGNOSIS — R0602 Shortness of breath: Secondary | ICD-10-CM | POA: Diagnosis not present

## 2017-03-29 DIAGNOSIS — Z9013 Acquired absence of bilateral breasts and nipples: Secondary | ICD-10-CM | POA: Insufficient documentation

## 2017-03-29 DIAGNOSIS — N182 Chronic kidney disease, stage 2 (mild): Secondary | ICD-10-CM | POA: Diagnosis not present

## 2017-03-29 DIAGNOSIS — Z853 Personal history of malignant neoplasm of breast: Secondary | ICD-10-CM | POA: Diagnosis not present

## 2017-03-29 DIAGNOSIS — Z882 Allergy status to sulfonamides status: Secondary | ICD-10-CM | POA: Diagnosis not present

## 2017-03-29 DIAGNOSIS — F419 Anxiety disorder, unspecified: Secondary | ICD-10-CM | POA: Diagnosis not present

## 2017-03-29 DIAGNOSIS — I472 Ventricular tachycardia: Secondary | ICD-10-CM | POA: Insufficient documentation

## 2017-03-29 DIAGNOSIS — N179 Acute kidney failure, unspecified: Secondary | ICD-10-CM | POA: Insufficient documentation

## 2017-03-29 DIAGNOSIS — R112 Nausea with vomiting, unspecified: Secondary | ICD-10-CM | POA: Diagnosis not present

## 2017-03-29 DIAGNOSIS — I5032 Chronic diastolic (congestive) heart failure: Secondary | ICD-10-CM | POA: Diagnosis not present

## 2017-03-29 DIAGNOSIS — R0600 Dyspnea, unspecified: Secondary | ICD-10-CM | POA: Insufficient documentation

## 2017-03-29 DIAGNOSIS — J449 Chronic obstructive pulmonary disease, unspecified: Secondary | ICD-10-CM | POA: Diagnosis not present

## 2017-03-29 DIAGNOSIS — R002 Palpitations: Secondary | ICD-10-CM | POA: Diagnosis not present

## 2017-03-29 DIAGNOSIS — I313 Pericardial effusion (noninflammatory): Secondary | ICD-10-CM | POA: Diagnosis not present

## 2017-03-29 DIAGNOSIS — I130 Hypertensive heart and chronic kidney disease with heart failure and stage 1 through stage 4 chronic kidney disease, or unspecified chronic kidney disease: Secondary | ICD-10-CM | POA: Diagnosis not present

## 2017-03-29 DIAGNOSIS — Z8249 Family history of ischemic heart disease and other diseases of the circulatory system: Secondary | ICD-10-CM | POA: Insufficient documentation

## 2017-03-29 LAB — URINALYSIS, ROUTINE W REFLEX MICROSCOPIC
BACTERIA UA: NONE SEEN
Bilirubin Urine: NEGATIVE
Glucose, UA: NEGATIVE mg/dL
Ketones, ur: 5 mg/dL — AB
LEUKOCYTES UA: NEGATIVE
Nitrite: NEGATIVE
Protein, ur: NEGATIVE mg/dL
SPECIFIC GRAVITY, URINE: 1.011 (ref 1.005–1.030)
pH: 6 (ref 5.0–8.0)

## 2017-03-29 LAB — BASIC METABOLIC PANEL
ANION GAP: 12 (ref 5–15)
BUN: 19 mg/dL (ref 6–20)
CALCIUM: 9.2 mg/dL (ref 8.9–10.3)
CO2: 25 mmol/L (ref 22–32)
Chloride: 101 mmol/L (ref 101–111)
Creatinine, Ser: 1.54 mg/dL — ABNORMAL HIGH (ref 0.44–1.00)
GFR, EST AFRICAN AMERICAN: 36 mL/min — AB (ref >60)
GFR, EST NON AFRICAN AMERICAN: 31 mL/min — AB (ref >60)
GLUCOSE: 111 mg/dL — AB (ref 65–99)
Potassium: 3.4 mmol/L — ABNORMAL LOW (ref 3.5–5.1)
SODIUM: 138 mmol/L (ref 135–145)

## 2017-03-29 LAB — PROTIME-INR
INR: 2.61
Prothrombin Time: 27.8 seconds — ABNORMAL HIGH (ref 11.4–15.2)

## 2017-03-29 LAB — CBC
HCT: 37 % (ref 36.0–46.0)
HEMOGLOBIN: 11.4 g/dL — AB (ref 12.0–15.0)
MCH: 27.9 pg (ref 26.0–34.0)
MCHC: 30.8 g/dL (ref 30.0–36.0)
MCV: 90.7 fL (ref 78.0–100.0)
Platelets: 246 10*3/uL (ref 150–400)
RBC: 4.08 MIL/uL (ref 3.87–5.11)
RDW: 16.6 % — ABNORMAL HIGH (ref 11.5–15.5)
WBC: 8.3 10*3/uL (ref 4.0–10.5)

## 2017-03-29 LAB — TROPONIN I: Troponin I: <0.03 ng/mL (ref <0.03)

## 2017-03-29 LAB — BRAIN NATRIURETIC PEPTIDE: B NATRIURETIC PEPTIDE 5: 963 pg/mL — AB (ref 0.0–100.0)

## 2017-03-29 MED ORDER — DILTIAZEM HCL-DEXTROSE 100-5 MG/100ML-% IV SOLN (PREMIX)
5.0000 mg/h | Freq: Once | INTRAVENOUS | Status: AC
  Administered 2017-03-29: 5 mg/h via INTRAVENOUS
  Filled 2017-03-29: qty 100

## 2017-03-29 NOTE — ED Triage Notes (Signed)
Pt reports was sent from doctors office for palpitations, nausea,emesis and intermittent dizziness. Pt denies chest pain.

## 2017-03-29 NOTE — Telephone Encounter (Signed)
Patient called stating that she is having reactions from medication metoprolol tartrate (LOPRESSOR) 50 MG  States that she is very fatigue, can't sleep, having diarrhea.  Please call [phone].

## 2017-03-29 NOTE — H&P (Signed)
History and Physical  Bailey Bishop BWG:[id number] DOB: [birth year] DOA: 03/29/2017  Referring physician: Dr Lita Mains, ED physician PCP: Sharilyn Sites, MD  Outpatient Specialists:  Bronson Ing (Cards)  Patient Coming From: home  Chief Complaint: SOB  HPI: Bailey Bishop is a 77 y.o. female with a history of atrial fibrillation, hypertension, GERD, arthritis, anxiety, COPD.  Patient has had several admissions due to atrial fibrillation with RVR.  She presents today with shortness of breath was felt to be in rapid ventricular response.  Her initial heart rate was as high as 140.  Her shortness of breath has been increasing over the past 3 or 4 weeks.  Patient was supposed to increase her metoprolol to 37.5 mg twice a day at her last appointment on 03/12/17.  She states that with an increased dose, she experienced tinnitus and vertigo, so she decreased her medication.  No palliating or provoking factors.  Emergency Department Course: Cardizem drip started  Review of Systems:   Pt denies any fevers, chills, nausea, vomiting, diarrhea, constipation, abdominal pain, shortness of breath, dyspnea on exertion, orthopnea, cough, wheezing, palpitations, headache, vision changes, lightheadedness, dizziness, melena, rectal bleeding.  Review of systems are otherwise negative  Past Medical History:  Diagnosis Date  . Anxiety   . Arthritis   . Asthma   . Atrial fibrillation ([place])   . Cancer [name]) 2001   right breast  . Dyspnea   . GERD (gastroesophageal reflux disease)   . Hypertension    Past Surgical History:  Procedure Laterality Date  . CARDIOVERSION N/A 02/18/2017   Procedure: CARDIOVERSION;  Surgeon: Josue Hector, MD;  Location: [name] ENDOSCOPY;  Service: Cardiovascular;  Laterality: N/A;  . CATARACT EXTRACTION W/PHACO  07/23/2011   Procedure: CATARACT EXTRACTION PHACO AND INTRAOCULAR LENS PLACEMENT ([place]);  Surgeon: Williams Che, MD;  Location: [facility] ORS;  Service: Ophthalmology;   Laterality: Right;  CDE:9.96  . CHOLECYSTECTOMY N/A 02/14/2015   Procedure: LAPAROSCOPIC CHOLECYSTECTOMY;  Surgeon: Aviva Signs, MD;  Location: [facility] ORS;  Service: General;  Laterality: N/A;  . MASTECTOMY     bilateral mastectomy-right breast cancer-left taken by choice  . SEPTOPLASTY  1995   Social History:  reports that she quit smoking about 33 years ago. Her smoking use included cigarettes. She has a 28.00 pack-year smoking history. she has never used smokeless tobacco. She reports that she does not drink alcohol or use drugs. Patient lives at home  Allergies  Allergen Reactions  . Cardizem Cd [Diltiazem Hcl Er Beads] Diarrhea  . Amiodarone Nausea And Vomiting  . Sulfa Antibiotics Nausea And Vomiting  . Carvedilol Rash  . Chocolate Rash    Dark chocolate    Family History  Problem Relation Age of Onset  . Cancer Mother   . Aneurysm Father   . Cancer Sister   . Cancer Sister   . Anesthesia problems Neg Hx   . Hypotension Neg Hx   . Malignant hyperthermia Neg Hx   . Pseudochol deficiency Neg Hx       Prior to Admission medications   Medication Sig Start Date End Date Taking? Authorizing Provider  acetaminophen (TYLENOL) 650 MG CR tablet Take 650 mg daily as needed by mouth for pain.    Yes [provider]  albuterol (PROVENTIL HFA;VENTOLIN HFA) 108 (90 BASE) MCG/ACT inhaler Inhale 2 puffs every 6 (six) hours as needed into the lungs for wheezing or shortness of breath.    Yes [provider]  ALPRAZolam Duanne Moron) 0.5 MG tablet  Take 0.5 mg at bedtime by mouth.  06/18/14  Yes [provider]  fluticasone (FLONASE) 50 MCG/ACT nasal spray Place 2 sprays daily into both nostrils. 01/29/17  Yes [provider]  furosemide (LASIX) 40 MG tablet Take 1 tablet (40 mg total) by mouth daily. 01/18/17  Yes Herminio Commons, MD  metoprolol tartrate (LOPRESSOR) 50 MG tablet Take 1 1/2 tabs (37.25m) twice a day 03/12/17  Yes KHerminio Commons MD    omeprazole (PRILOSEC) 20 MG capsule Take 20 mg daily as needed by mouth (indigestion).  02/14/16  Yes [provider]  potassium chloride SA (K-DUR,KLOR-CON) 20 MEQ tablet Take 1 tablet (20 mEq total) by mouth once. Patient taking differently: Take 20 mEq daily by mouth.  10/25/16 01/22/18 Yes LLendon Colonel NP  warfarin (COUMADIN) 5 MG tablet Take 1/2 tablet daily except 1 tablet on Sundays and Wednesdays 03/13/17  Yes MSatira Sark MD  BREO ELLIPTA 100-25 MCG/INH AEPB Inhale 1 puff into the lungs daily as needed.  01/31/15   [provider]  ondansetron (ZOFRAN) 4 MG tablet Take 1 tablet (4 mg total) by mouth every 8 (eight) hours as needed for nausea or vomiting. Patient not taking: Reported on 03/29/2017 01/22/17   KDorie Rank MD    Physical Exam: BP (!) 158/105   Pulse (!) 112   Temp (!) 97 F (36.1 C) (Oral)   Resp 20   Ht '5\' 4"'  (1.626 m)   Wt 66.7 kg (147 lb)   SpO2 97%   BMI 25.23 kg/m   General: Elderly Caucasian female. Awake and alert and oriented x3. No acute cardiopulmonary distress.  HEENT: Normocephalic atraumatic.  Right and left ears normal in appearance.  Pupils equal, round, reactive to light. Extraocular muscles are intact. Sclerae anicteric and noninjected.  Moist mucosal membranes. No mucosal lesions.  Neck: Neck supple without lymphadenopathy. No carotid bruits. No masses palpated.  Cardiovascular: Tachycardic irregularly irregular rate. No murmurs, rubs, gallops auscultated. No JVD.  Respiratory: Good respiratory effort with no wheezes, rales, rhonchi. Lungs clear to auscultation bilaterally.  No accessory muscle use. Abdomen: Soft, nontender, nondistended. Active bowel sounds. No masses or hepatosplenomegaly  Skin: No rashes, lesions, or ulcerations.  Dry, warm to touch. 2+ dorsalis pedis and radial pulses. Musculoskeletal: No calf or leg pain. All major joints not erythematous nontender.  No upper or lower joint deformation.  Good ROM.   No contractures  Psychiatric: Intact judgment and insight. Pleasant and cooperative. Neurologic: No focal neurological deficits. Strength is 5/5 and symmetric in upper and lower extremities.  Cranial nerves II through XII are grossly intact.           Labs on Admission: I have personally reviewed following labs and imaging studies  CBC: Recent Labs  Lab 03/29/17 1305  WBC 8.3  HGB 11.4*  HCT 37.0  MCV 90.7  PLT 2390  Basic Metabolic Panel: Recent Labs  Lab 03/29/17 1305  NA 138  K 3.4*  CL 101  CO2 25  GLUCOSE 111*  BUN 19  CREATININE 1.54*  CALCIUM 9.2   GFR: Estimated Creatinine Clearance: 28.3 mL/min (A) (by C-G formula based on SCr of 1.54 mg/dL (H)). Liver Function Tests: No results for input(s): AST, ALT, ALKPHOS, BILITOT, PROT, ALBUMIN in the last 168 hours. No results for input(s): LIPASE, AMYLASE in the last 168 hours. No results for input(s): AMMONIA in the last 168 hours. Coagulation Profile: Recent Labs  Lab 03/29/17 1305  INR 2.61  Cardiac Enzymes: Recent Labs  Lab 03/29/17 1305  TROPONINI <0.03   BNP (last 3 results) No results for input(s): PROBNP in the last 8760 hours. HbA1C: No results for input(s): HGBA1C in the last 72 hours. CBG: No results for input(s): GLUCAP in the last 168 hours. Lipid Profile: No results for input(s): CHOL, HDL, LDLCALC, TRIG, CHOLHDL, LDLDIRECT in the last 72 hours. Thyroid Function Tests: No results for input(s): TSH, T4TOTAL, FREET4, T3FREE, THYROIDAB in the last 72 hours. Anemia Panel: No results for input(s): VITAMINB12, FOLATE, FERRITIN, TIBC, IRON, RETICCTPCT in the last 72 hours. Urine analysis:    Component Value Date/Time   COLORURINE YELLOW 01/22/2017 1600   APPEARANCEUR CLEAR 01/22/2017 1600   LABSPEC 1.008 01/22/2017 1600   PHURINE 6.0 01/22/2017 1600   GLUCOSEU NEGATIVE 01/22/2017 1600   HGBUR SMALL (A) 01/22/2017 1600   BILIRUBINUR NEGATIVE 01/22/2017 1600   KETONESUR NEGATIVE 01/22/2017  1600   PROTEINUR NEGATIVE 01/22/2017 1600   NITRITE NEGATIVE 01/22/2017 1600   LEUKOCYTESUR TRACE (A) 01/22/2017 1600   Sepsis Labs: '@LABRCNTIP' (procalcitonin:4,lacticidven:4) )No results found for this or any previous visit (from the past 240 hour(s)).   Radiological Exams on Admission: Dg Chest 2 View  Result Date: 03/29/2017 CLINICAL DATA:  Palpitations. Shortness of breath. Dizziness. Nausea. EXAM: CHEST  2 VIEW COMPARISON:  09/23/2016. FINDINGS: Mediastinum and hilar structures normal. Cardiomegaly with normal pulmonary vascularity. Bilateral pleural-parenchymal thickening noted consistent scarring. Diffuse degenerative change thoracic spine. Surgical clips right upper quadrant . IMPRESSION: 1. Cardiomegaly.  No pulmonary venous congestion. 2. Bilateral pleural-parenchymal thickening consistent with scarring. COPD. Electronically Signed   By: Marcello Moores  Register   On: 03/29/2017 12:36    EKG: Independently reviewed.  A. fib with RVR.  Left axis deviation.  No acute ST changes  Assessment/Plan: Principal Problem:   Atrial fibrillation with RVR (HCC) Active Problems:   GERD (gastroesophageal reflux disease)   Shortness of breath   Essential hypertension   CKD (chronic kidney disease), stage II   Hypokalemia   Acute CHF (HCC)   COPD (chronic obstructive pulmonary disease) (HCC)   Persistent atrial fibrillation ([place])    This patient was discussed with the ED physician, including pertinent vitals, physical exam findings, labs, and imaging.  We also discussed care given by the ED provider.  1.  A. fib with RVR  Observation in stepdown with Cardizem drip  Convert to oral therapy when parameters met  Continue low-dose beta-blocker  On Coumadin  INR tomorrow 2.  Shortness of breath    Secondary to A. fib with RVR 3.  Hypokalemia 4.  Acute CHF  Mild by lab only  No rales auscultated 5.  COPD  Controlled 6.  Hypertension  Continue antihypertensives 7.  GERD  Continue  PPI 8.  Hypokalemia  Continue potassium replacement  DVT prophylaxis: Coumadin therapeutic Consultants: None Code Status: Full code Family Communication: None Disposition Plan: Patient to return home following admission   Issa Luster Moores [facility] Pager [phone]  If 7PM-7AM, please contact night-coverage [email or web address] Password [email or web address]

## 2017-03-29 NOTE — ED Notes (Signed)
Pt requesting an IV change because the one she has in now hurts. Notified pt that she needed to straighten arm out , pt had arm bent backwards laying on iv site.

## 2017-03-29 NOTE — Telephone Encounter (Signed)
Patient states she feels like her heart is constantly racing HR is 122 at home. Patient states it has been this way all day. Patient states it is causing her to vomit and she cannot sleep. Patient states she is very fatigue and weak due to this. Advised patient she needed to be evaluated in the emergency room to ensure nothing else is going on. Patient verbalized understanding and states she will go to [facility]. Will send to [name] to [facility].

## 2017-03-29 NOTE — ED Notes (Signed)
Pt states she does not need to urinate at this time, aware of DO  

## 2017-03-29 NOTE — Telephone Encounter (Signed)
Patient called back stating that her heart is racing, she is very fatigue wants to know if she needs to go to the ER

## 2017-03-29 NOTE — ED Triage Notes (Signed)
Multiple complaints  Blames meds 2 week hx of fast HR reported by spouse in 120s  Also no sleep in 3 nights Dry heaves dizzy

## 2017-03-29 NOTE — ED Provider Notes (Signed)
[facility] EMERGENCY DEPARTMENT Provider Note   CSN: [id number] Arrival date & time: 03/29/17  1149     History   Chief Complaint Chief Complaint  Patient presents with  . Palpitations    HPI Bailey Bishop is a 77 y.o. female.  HPI Patient presents with several complaints.  States she has had several weeks of dizziness which she describes as a spinning sensation.  This is associated with nausea and "dry heaves".  She also states she has had sinus pressure and bilateral ear ringing.  She complains of shortness of breath especially with exertion.  She has had cough without sputum production.  Denies any new lower extremity swelling or pain.  Patient also complains of palpitations.  States her metoprolol level was decreased 2 weeks ago.  She denies any chest pain.  No abdominal pain.  She takes Coumadin for chronic atrial fibrillation. Past Medical History:  Diagnosis Date  . Anxiety   . Arthritis   . Asthma   . Atrial fibrillation ([place])   . Cancer [facility]) 2001   right breast  . Dyspnea   . GERD (gastroesophageal reflux disease)   . Hypertension     Patient Active Problem List   Diagnosis Date Noted  . Persistent atrial fibrillation ([place])   . Long term (current) use of anticoagulants [Z79.01] 11/06/2016  . Anticoagulated 10/03/2016  . COPD (chronic obstructive pulmonary disease) ([place]) 10/03/2016  . Hypokalemia 09/24/2016  . Asthma in adult without complication 16/09/3708  . Acute CHF ([place]) 09/24/2016  . Atrial fibrillation with RVR ([place]) 09/23/2016  . CKD (chronic kidney disease), stage II 09/23/2016  . Hyperglycemia 09/23/2016  . Anxiety   . GERD (gastroesophageal reflux disease)   . Shortness of breath   . Essential hypertension   . Status asthmaticus 07/07/2014    Past Surgical History:  Procedure Laterality Date  . CARDIOVERSION N/A 02/18/2017   Procedure: CARDIOVERSION;  Surgeon: Josue Hector, MD;  Location: [name] ENDOSCOPY;  Service: Cardiovascular;   Laterality: N/A;  . CATARACT EXTRACTION W/PHACO  07/23/2011   Procedure: CATARACT EXTRACTION PHACO AND INTRAOCULAR LENS PLACEMENT ([place]);  Surgeon: Williams Che, MD;  Location: [facility] ORS;  Service: Ophthalmology;  Laterality: Right;  CDE:9.96  . CHOLECYSTECTOMY N/A 02/14/2015   Procedure: LAPAROSCOPIC CHOLECYSTECTOMY;  Surgeon: Aviva Signs, MD;  Location: [facility] ORS;  Service: General;  Laterality: N/A;  . MASTECTOMY     bilateral mastectomy-right breast cancer-left taken by choice  . SEPTOPLASTY  1995    OB History    No data available       Home Medications    Prior to Admission medications   Medication Sig Start Date End Date Taking? Authorizing Provider  acetaminophen (TYLENOL) 650 MG CR tablet Take 650 mg daily as needed by mouth for pain.    Yes [provider]  albuterol (PROVENTIL HFA;VENTOLIN HFA) 108 (90 BASE) MCG/ACT inhaler Inhale 2 puffs every 6 (six) hours as needed into the lungs for wheezing or shortness of breath.    Yes [provider]  ALPRAZolam Duanne Moron) 0.5 MG tablet Take 0.5 mg at bedtime by mouth.  06/18/14  Yes [provider]  fluticasone (FLONASE) 50 MCG/ACT nasal spray Place 2 sprays daily into both nostrils. 01/29/17  Yes [provider]  furosemide (LASIX) 40 MG tablet Take 1 tablet (40 mg total) by mouth daily. 01/18/17  Yes Herminio Commons, MD  metoprolol tartrate (LOPRESSOR) 50 MG tablet Take 1 1/2 tabs (37.5mg ) twice a day 03/12/17  Yes Herminio Commons, MD  omeprazole (PRILOSEC) 20 MG capsule Take 20 mg daily as needed by mouth (indigestion).  02/14/16  Yes [provider]  potassium chloride SA (K-DUR,KLOR-CON) 20 MEQ tablet Take 1 tablet (20 mEq total) by mouth once. Patient taking differently: Take 20 mEq daily by mouth.  10/25/16 01/22/18 Yes Lendon Colonel, NP  warfarin (COUMADIN) 5 MG tablet Take 1/2 tablet daily except 1 tablet on Sundays and Wednesdays 03/13/17  Yes Satira Sark, MD  BREO  ELLIPTA 100-25 MCG/INH AEPB Inhale 1 puff into the lungs daily as needed.  01/31/15   [provider]  ondansetron (ZOFRAN) 4 MG tablet Take 1 tablet (4 mg total) by mouth every 8 (eight) hours as needed for nausea or vomiting. Patient not taking: Reported on 03/29/2017 01/22/17   Dorie Rank, MD    Family History Family History  Problem Relation Age of Onset  . Cancer Mother   . Aneurysm Father   . Cancer Sister   . Cancer Sister   . Anesthesia problems Neg Hx   . Hypotension Neg Hx   . Malignant hyperthermia Neg Hx   . Pseudochol deficiency Neg Hx     Social History Social History   Tobacco Use  . Smoking status: Former Smoker    Packs/day: 1.00    Years: 28.00    Pack years: 28.00    Types: Cigarettes    Last attempt to quit: 07/17/1983    Years since quitting: 33.7  . Smokeless tobacco: Never Used  Substance Use Topics  . Alcohol use: No  . Drug use: No     Allergies   Cardizem cd [diltiazem hcl er beads]; Amiodarone; Sulfa antibiotics; Carvedilol; and Chocolate   Review of Systems Review of Systems  Constitutional: Negative for chills and fever.  HENT: Positive for congestion, sinus pressure and tinnitus. Negative for facial swelling, sore throat and trouble swallowing.   Eyes: Negative for visual disturbance.  Respiratory: Positive for cough, shortness of breath and wheezing.   Cardiovascular: Positive for palpitations. Negative for chest pain and leg swelling.  Gastrointestinal: Positive for nausea and vomiting. Negative for abdominal pain, constipation and diarrhea.  Genitourinary: Negative for dysuria, flank pain, frequency and hematuria.  Musculoskeletal: Negative for back pain, myalgias, neck pain and neck stiffness.  Skin: Negative for rash and wound.  Neurological: Positive for dizziness. Negative for weakness, light-headedness, numbness and headaches.  All other systems reviewed and are negative.    Physical Exam Updated Vital Signs BP (!)  158/105   Pulse (!) 112   Temp (!) 97 F (36.1 C) (Oral)   Resp 20   Ht 5\' 4"  (1.626 m)   Wt 66.7 kg (147 lb)   SpO2 97%   BMI 25.23 kg/m   Physical Exam  Constitutional: She is oriented to person, place, and time. She appears well-developed and well-nourished. No distress.  HENT:  Head: Normocephalic and atraumatic.  Mouth/Throat: Oropharynx is clear and moist. No oropharyngeal exudate.  Eyes: EOM are normal. Pupils are equal, round, and reactive to light.  No nystagmus  Neck: Normal range of motion. Neck supple.  Cardiovascular:  Tachycardia.  Irregularly irregular.  Pulmonary/Chest: Effort normal. She has wheezes.  Diminished breath sounds in bilateral bases.  Few scattered end expiratory wheezes.  Abdominal: Soft. Bowel sounds are normal. There is no tenderness. There is no rebound and no guarding.  Musculoskeletal: Normal range of motion. She exhibits no edema or tenderness.  No lower extremity swelling, asymmetry  or tenderness.  Lymphadenopathy:    She has no cervical adenopathy.  Neurological: She is alert and oriented to person, place, and time.  Patient is alert and oriented x3 with clear, goal oriented speech. Patient has 5/5 motor in all extremities. Sensation is intact to light touch. Bilateral finger-to-nose is normal with no signs of dysmetria.  Skin: Skin is warm and dry. Capillary refill takes less than 2 seconds. No rash noted. She is not diaphoretic. No erythema.  Psychiatric: She has a normal mood and affect. Her behavior is normal.  Nursing note and vitals reviewed.    ED Treatments / Results  Labs (all labs ordered are listed, but only abnormal results are displayed) Labs Reviewed  BASIC METABOLIC PANEL - Abnormal; Notable for the following components:      Result Value   Potassium 3.4 (*)    Glucose, Bld 111 (*)    Creatinine, Ser 1.54 (*)    GFR calc non Af Amer 31 (*)    GFR calc Af Amer 36 (*)    All other components within normal limits  CBC -  Abnormal; Notable for the following components:   Hemoglobin 11.4 (*)    RDW 16.6 (*)    All other components within normal limits  PROTIME-INR - Abnormal; Notable for the following components:   Prothrombin Time 27.8 (*)    All other components within normal limits  BRAIN NATRIURETIC PEPTIDE - Abnormal; Notable for the following components:   B Natriuretic Peptide 963.0 (*)    All other components within normal limits  TROPONIN I  URINALYSIS, ROUTINE W REFLEX MICROSCOPIC    EKG  EKG Interpretation  Date/Time:  Friday March 29 2017 11:59:55 EST Ventricular Rate:  104 PR Interval:    QRS Duration: 82 QT Interval:  344 QTC Calculation: 452 R Axis:   -44 Text Interpretation:  Atrial fibrillation with rapid ventricular response Left axis deviation Low voltage QRS Abnormal ECG Confirmed by Julianne Rice [phone]) on 03/29/2017 5:11:07 PM       Radiology Dg Chest 2 View  Result Date: 03/29/2017 CLINICAL DATA:  Palpitations. Shortness of breath. Dizziness. Nausea. EXAM: CHEST  2 VIEW COMPARISON:  09/23/2016. FINDINGS: Mediastinum and hilar structures normal. Cardiomegaly with normal pulmonary vascularity. Bilateral pleural-parenchymal thickening noted consistent scarring. Diffuse degenerative change thoracic spine. Surgical clips right upper quadrant . IMPRESSION: 1. Cardiomegaly.  No pulmonary venous congestion. 2. Bilateral pleural-parenchymal thickening consistent with scarring. COPD. Electronically Signed   By: Marcello Moores  Register   On: 03/29/2017 12:36    Procedures Procedures (including critical care time)  Medications Ordered in ED Medications  diltiazem (CARDIZEM) 100 mg in dextrose 5% 178mL (1 mg/mL) infusion (10 mg/hr Intravenous Rate/Dose Change 03/29/17 1813)     Initial Impression / Assessment and Plan / ED Course  I have reviewed the triage vital signs and the nursing notes.  Pertinent labs & imaging results that were available during my care of the patient were  reviewed by me and considered in my medical decision making (see chart for details).     Placed on Cardizem drip.  Heart rate is improved.    Patient's dizziness is likely related to peripheral vertigo.  Normal neurologic exam.  Low suspicion for central cause.  Discussed with hospitalist.  Will see patient in emergency department and admit.  Final Clinical Impressions(s) / ED Diagnoses   Final diagnoses:  Atrial fibrillation with RVR [facility])    ED Discharge Orders    None  Julianne Rice, MD 03/29/17 Einar Crow

## 2017-03-30 ENCOUNTER — Encounter (HOSPITAL_COMMUNITY): Payer: Self-pay | Admitting: *Deleted

## 2017-03-30 ENCOUNTER — Other Ambulatory Visit: Payer: Self-pay

## 2017-03-30 DIAGNOSIS — N183 Chronic kidney disease, stage 3 (moderate): Secondary | ICD-10-CM | POA: Diagnosis not present

## 2017-03-30 DIAGNOSIS — I10 Essential (primary) hypertension: Secondary | ICD-10-CM | POA: Diagnosis not present

## 2017-03-30 DIAGNOSIS — I472 Ventricular tachycardia: Secondary | ICD-10-CM | POA: Diagnosis not present

## 2017-03-30 DIAGNOSIS — I4891 Unspecified atrial fibrillation: Secondary | ICD-10-CM | POA: Diagnosis not present

## 2017-03-30 DIAGNOSIS — E876 Hypokalemia: Secondary | ICD-10-CM

## 2017-03-30 DIAGNOSIS — N178 Other acute kidney failure: Secondary | ICD-10-CM | POA: Diagnosis not present

## 2017-03-30 DIAGNOSIS — N182 Chronic kidney disease, stage 2 (mild): Secondary | ICD-10-CM | POA: Diagnosis not present

## 2017-03-30 LAB — BASIC METABOLIC PANEL
ANION GAP: 14 (ref 5–15)
BUN: 15 mg/dL (ref 6–20)
CALCIUM: 9.5 mg/dL (ref 8.9–10.3)
CO2: 23 mmol/L (ref 22–32)
Chloride: 100 mmol/L — ABNORMAL LOW (ref 101–111)
Creatinine, Ser: 1.42 mg/dL — ABNORMAL HIGH (ref 0.44–1.00)
GFR calc non Af Amer: 34 mL/min — ABNORMAL LOW (ref >60)
GFR, EST AFRICAN AMERICAN: 40 mL/min — AB (ref >60)
GLUCOSE: 119 mg/dL — AB (ref 65–99)
POTASSIUM: 3.3 mmol/L — AB (ref 3.5–5.1)
Sodium: 137 mmol/L (ref 135–145)

## 2017-03-30 LAB — CBC
HEMATOCRIT: 36.3 % (ref 36.0–46.0)
HEMOGLOBIN: 11.6 g/dL — AB (ref 12.0–15.0)
MCH: 28.6 pg (ref 26.0–34.0)
MCHC: 32 g/dL (ref 30.0–36.0)
MCV: 89.6 fL (ref 78.0–100.0)
Platelets: 251 10*3/uL (ref 150–400)
RBC: 4.05 MIL/uL (ref 3.87–5.11)
RDW: 16.6 % — ABNORMAL HIGH (ref 11.5–15.5)
WBC: 9.6 10*3/uL (ref 4.0–10.5)

## 2017-03-30 LAB — MAGNESIUM: MAGNESIUM: 1.9 mg/dL (ref 1.7–2.4)

## 2017-03-30 LAB — PROTIME-INR
INR: 2.26
PROTHROMBIN TIME: 24.8 s — AB (ref 11.4–15.2)

## 2017-03-30 MED ORDER — FLUTICASONE FUROATE-VILANTEROL 100-25 MCG/INH IN AEPB
1.0000 | INHALATION_SPRAY | Freq: Every day | RESPIRATORY_TRACT | Status: DC
  Administered 2017-03-31: 1 via RESPIRATORY_TRACT
  Filled 2017-03-30: qty 28

## 2017-03-30 MED ORDER — FLUTICASONE PROPIONATE 50 MCG/ACT NA SUSP
2.0000 | Freq: Every day | NASAL | Status: DC
  Administered 2017-04-01: 2 via NASAL
  Filled 2017-03-30 (×2): qty 16

## 2017-03-30 MED ORDER — WARFARIN SODIUM 2.5 MG PO TABS
2.5000 mg | ORAL_TABLET | ORAL | Status: DC
  Administered 2017-03-30: 2.5 mg via ORAL
  Filled 2017-03-30 (×2): qty 1

## 2017-03-30 MED ORDER — WARFARIN - PHARMACIST DOSING INPATIENT: Freq: Every day | Status: DC

## 2017-03-30 MED ORDER — POTASSIUM CHLORIDE CRYS ER 20 MEQ PO TBCR: 20.0000 meq | EXTENDED_RELEASE_TABLET | Freq: Every day | ORAL | Status: DC

## 2017-03-30 MED ORDER — FUROSEMIDE 40 MG PO TABS
40.0000 mg | ORAL_TABLET | Freq: Every day | ORAL | Status: DC
  Administered 2017-03-30 – 2017-04-01 (×3): 40 mg via ORAL
  Filled 2017-03-30: qty 1
  Filled 2017-03-30: qty 2
  Filled 2017-03-30: qty 1

## 2017-03-30 MED ORDER — ALPRAZOLAM 0.5 MG PO TABS
0.5000 mg | ORAL_TABLET | Freq: Every day | ORAL | Status: DC
  Administered 2017-03-30 – 2017-03-31 (×3): 0.5 mg via ORAL
  Filled 2017-03-30 (×3): qty 1

## 2017-03-30 MED ORDER — POTASSIUM CHLORIDE CRYS ER 20 MEQ PO TBCR
40.0000 meq | EXTENDED_RELEASE_TABLET | Freq: Every day | ORAL | Status: DC
  Administered 2017-03-30 – 2017-04-01 (×3): 40 meq via ORAL
  Filled 2017-03-30: qty 2
  Filled 2017-03-30: qty 4
  Filled 2017-03-30: qty 2

## 2017-03-30 MED ORDER — DILTIAZEM HCL-DEXTROSE 100-5 MG/100ML-% IV SOLN (PREMIX)
5.0000 mg/h | INTRAVENOUS | Status: DC
  Administered 2017-03-30: 15 mg/h via INTRAVENOUS
  Filled 2017-03-30: qty 100

## 2017-03-30 MED ORDER — ACETAMINOPHEN 325 MG PO TABS
650.0000 mg | ORAL_TABLET | ORAL | Status: DC | PRN
  Administered 2017-03-30: 650 mg via ORAL
  Filled 2017-03-30: qty 2

## 2017-03-30 MED ORDER — ONDANSETRON HCL 4 MG/2ML IJ SOLN
4.0000 mg | Freq: Four times a day (QID) | INTRAMUSCULAR | Status: DC | PRN
  Administered 2017-03-30: 4 mg via INTRAVENOUS
  Filled 2017-03-30: qty 2

## 2017-03-30 MED ORDER — WARFARIN SODIUM 5 MG PO TABS
5.0000 mg | ORAL_TABLET | ORAL | Status: DC
  Administered 2017-03-31: 5 mg via ORAL
  Filled 2017-03-30: qty 1

## 2017-03-30 MED ORDER — METOPROLOL TARTRATE 25 MG PO TABS
25.0000 mg | ORAL_TABLET | Freq: Two times a day (BID) | ORAL | Status: DC
  Administered 2017-03-30 – 2017-04-01 (×5): 25 mg via ORAL
  Filled 2017-03-30 (×5): qty 1

## 2017-03-30 MED ORDER — PANTOPRAZOLE SODIUM 40 MG PO TBEC
40.0000 mg | DELAYED_RELEASE_TABLET | Freq: Every day | ORAL | Status: DC
  Administered 2017-03-30 – 2017-04-01 (×3): 40 mg via ORAL
  Filled 2017-03-30 (×3): qty 1

## 2017-03-30 MED ORDER — DILTIAZEM HCL 30 MG PO TABS
30.0000 mg | ORAL_TABLET | Freq: Four times a day (QID) | ORAL | Status: DC
  Administered 2017-03-30 – 2017-03-31 (×5): 30 mg via ORAL
  Filled 2017-03-30 (×5): qty 1

## 2017-03-30 NOTE — ED Notes (Addendum)
Dr. Manuella Ghazi made aware that the pt is almost finished her 2nd bag of diltiazem. Pt is on the max dose of 15mg /hr. Pt's heart rate ranges from 87-108. Pt remains in afib at this time. Orders were received to give pt 30 mg diltiazem PO now and q 6hrs afterwards and when this current bag of diltiazem is finished not to hang another bag. This RN will continue to monitor this pt's heart rate and rhythm.

## 2017-03-30 NOTE — ED Notes (Signed)
When giving this pt 0.5mg  xanax, the pt stated she had not taken her night time metoprolol. This RN advised her that it was ordered at midnight but the pt was sleeping and at 3am when this RN spoke with Dr. Manuella Ghazi about the xanax, he informed this RN not to give the metoprolol at this time.

## 2017-03-30 NOTE — Progress Notes (Signed)
Telemetry has been verified by this RN and telemetry tech. Pt is in afib heart rate 110.

## 2017-03-30 NOTE — Progress Notes (Signed)
PROGRESS NOTE                                                                                                                                                                                                             Patient Demographics:    Bailey Bishop, is a 77 y.o. female, DOB - 05-Nov-1940, MVH:[id number]  Admit date - 03/29/2017   Admitting Physician Pratik Darleen Crocker, DO  Outpatient Primary MD for the patient is Sharilyn Sites, MD  LOS - 0  Outpatient Specialists: Dr Lorelee New  Chief Complaint  Patient presents with  . Palpitations       Brief Narrative  77 y/o female with Afib on coumadin ( failed recent DCCV, intolerant to long acting cardizem and amiodarone), GERD, HTN, anxiety presented with shortness of breath on exertion with findings of  rapid Afib. Required Cardizem drip briefly in ED after admitted.   Subjective:   C/o headache and nausea. Off cardizem drip.   Assessment  & Plan :    Principal Problem:   Atrial fibrillation with RVR (HCC) Recurrent symptoms with failed DCCV in 01/2017. Off cardizem drip, now on oral cardizem 60 mg q6h. metoprolol dose was  recently increased by her cardiologist to 37.5 mg bid but says she could not tolerate with dizziness and ?tinnitus. Continue metoprolol 25 mg bid.  Intolerant to amiodarone due to N/V, intolerant to long active cardizem due to diarrhea. May place her on short acting cardizem for now along with metoprolol.  Continue anticoagulant.  Active Problems: Shortness of breath  Secondary to rapid Afib, no sighs of heart failure      Essential hypertension Stable, continue home meds    CKD (chronic kidney disease), stage II At baseline    Hypokalemia replenished         Code Status : full code  Family Communication  : husband at bedside  Disposition Plan  : home in 1-2 days  Barriers For Discharge : active symptoms  Consults  :   none  Procedures  : none  DVT Prophylaxis  :  coumadin  Lab Results  Component Value Date   PLT 251 03/30/2017    Antibiotics  :    Anti-infectives (From admission, onward)   None        Objective:   Vitals:   03/30/17 0630 03/30/17 0700  03/30/17 0800 03/30/17 1446  BP: 123/62 121/81 134/77 (!) 117/93  Pulse: 87 100 (!) 110 85  Resp: 19 (!) 23 15 18   Temp:   98.4 F (36.9 C) 98.2 F (36.8 C)  TempSrc:    Oral  SpO2: 92% 96% 100% 97%  Weight:   65.8 kg (145 lb)   Height:   5\' 4"  (1.626 m)     Wt Readings from Last 3 Encounters:  03/30/17 65.8 kg (145 lb)  03/12/17 66.7 kg (147 lb)  01/31/17 68.9 kg (152 lb)     Intake/Output Summary (Last 24 hours) at 03/30/2017 1628 Last data filed at 03/30/2017 1300 Gross per 24 hour  Intake 320 ml  Output -  Net 320 ml     Physical Exam  Gen: not in distress HEENT: moist mucosa, supple neck Chest: clear b/l, no added sounds CVS: S1&S2 irregular no murmurs, rubs or gallop GI: soft, NT, ND, Musculoskeletal: warm, no edema     Data Review:    CBC Recent Labs  Lab 03/29/17 1305 03/30/17 0447  WBC 8.3 9.6  HGB 11.4* 11.6*  HCT 37.0 36.3  PLT 246 251  MCV 90.7 89.6  MCH 27.9 28.6  MCHC 30.8 32.0  RDW 16.6* 16.6*    Chemistries  Recent Labs  Lab 03/29/17 1305 03/30/17 0447  NA 138 137  K 3.4* 3.3*  CL 101 100*  CO2 25 23  GLUCOSE 111* 119*  BUN 19 15  CREATININE 1.54* 1.42*  CALCIUM 9.2 9.5   ------------------------------------------------------------------------------------------------------------------ No results for input(s): CHOL, HDL, LDLCALC, TRIG, CHOLHDL, LDLDIRECT in the last 72 hours.  Lab Results  Component Value Date   HGBA1C 4.9 09/23/2016   ------------------------------------------------------------------------------------------------------------------ No results for input(s): TSH, T4TOTAL, T3FREE, THYROIDAB in the last 72 hours.  Invalid input(s):  FREET3 ------------------------------------------------------------------------------------------------------------------ No results for input(s): VITAMINB12, FOLATE, FERRITIN, TIBC, IRON, RETICCTPCT in the last 72 hours.  Coagulation profile Recent Labs  Lab 03/29/17 1305 03/30/17 0447  INR 2.61 2.26    No results for input(s): DDIMER in the last 72 hours.  Cardiac Enzymes Recent Labs  Lab 03/29/17 1305  TROPONINI <0.03   ------------------------------------------------------------------------------------------------------------------    Component Value Date/Time   BNP 963.0 (H) 03/29/2017 1305    Inpatient Medications  Scheduled Meds: . ALPRAZolam  0.5 mg Oral QHS  . diltiazem  30 mg Oral Q6H  . fluticasone  2 spray Each Nare Daily  . fluticasone furoate-vilanterol  1 puff Inhalation Daily  . furosemide  40 mg Oral Daily  . metoprolol tartrate  25 mg Oral BID  . pantoprazole  40 mg Oral Daily  . potassium chloride SA  40 mEq Oral Daily  . warfarin  2.5 mg Oral Once per day on Mon Tue Thu Fri Sat  . [START ON 03/31/2017] warfarin  5 mg Oral Once per day on Sun Wed  . Warfarin - Pharmacist Dosing Inpatient   Does not apply q1800   Continuous Infusions: . diltiazem (CARDIZEM) infusion Stopped (03/30/17 0526)   PRN Meds:.acetaminophen, ondansetron (ZOFRAN) IV  Micro Results No results found for this or any previous visit (from the past 240 hour(s)).  Radiology Reports Dg Chest 2 View  Result Date: 03/29/2017 CLINICAL DATA:  Palpitations. Shortness of breath. Dizziness. Nausea. EXAM: CHEST  2 VIEW COMPARISON:  09/23/2016. FINDINGS: Mediastinum and hilar structures normal. Cardiomegaly with normal pulmonary vascularity. Bilateral pleural-parenchymal thickening noted consistent scarring. Diffuse degenerative change thoracic spine. Surgical clips right upper quadrant . IMPRESSION: 1. Cardiomegaly.  No  pulmonary venous congestion. 2. Bilateral pleural-parenchymal thickening  consistent with scarring. COPD. Electronically Signed   By: Marcello Moores  Register   On: 03/29/2017 12:36    Time Spent in minutes  25   Lathen Seal M.D on 03/30/2017 at 4:28 PM  Between 7am to 7pm - Pager - [phone]  After 7pm go to [email or web address] - password [facility]  [facility] -  Office  [phone]

## 2017-03-30 NOTE — ED Notes (Signed)
Dr. Manuella Ghazi, is changing pt to a telemetry bed instead of a step down bed.

## 2017-03-30 NOTE — Progress Notes (Signed)
Patient stating based on spiritual beliefs, she is refusing blood and blood products.   Patient also informing this Rn that right arm cannot be used for IV sticks or blood pressure. Extremity alert bracelet, fall risk, and allergy bracelet has been applied to the right arm.

## 2017-03-30 NOTE — ED Notes (Signed)
Pt walked to the restroom. Pt was sob upon arrival back to her room. Pt's o2 remained above 92% but the pt states that is her main complaint. Pt states she gets sob after walking small distances.

## 2017-03-30 NOTE — Progress Notes (Signed)
ANTICOAGULATION CONSULT NOTE - Initial Consult  Pharmacy Consult for warfarin Indication: atrial fibrillation  Allergies  Allergen Reactions  . Cardizem Cd [Diltiazem Hcl Er Beads] Diarrhea  . Amiodarone Nausea And Vomiting  . Sulfa Antibiotics Nausea And Vomiting  . Carvedilol Rash  . Chocolate Rash    Dark chocolate    Patient Measurements: Height: 5\' 4"  (162.6 cm) Weight: 147 lb (66.7 kg) IBW/kg (Calculated) : 54.7   Vital Signs: BP: 151/96 (01/04 2300) Pulse Rate: 98 (01/04 2300)  Labs: Recent Labs    03/29/17 1305  HGB 11.4*  HCT 37.0  PLT 246  LABPROT 27.8*  INR 2.61  CREATININE 1.54*  TROPONINI <0.03    Estimated Creatinine Clearance: 28.3 mL/min (A) (by C-G formula based on SCr of 1.54 mg/dL (H)).   Medical History: Past Medical History:  Diagnosis Date  . Anxiety   . Arthritis   . Asthma   . Atrial fibrillation ([place])   . Cancer [facility]) 2001   right breast  . Dyspnea   . GERD (gastroesophageal reflux disease)   . Hypertension     Medications:   (Not in a hospital admission)  Assessment: 77 yo F on warfarin PTA for afib.  Home dose: 2.5 mg daily except 5 mg Wed/Sun.  Last dose 1/4.  INR on admit 2.6   Goal of Therapy:  INR 2-3 Monitor platelets by anticoagulation protocol: Yes   Plan:  Resume home dose Daily INR x 3 days, then PRN  Vonda Antigua 03/30/2017,12:17 AM

## 2017-03-31 DIAGNOSIS — E876 Hypokalemia: Secondary | ICD-10-CM | POA: Diagnosis not present

## 2017-03-31 DIAGNOSIS — I4891 Unspecified atrial fibrillation: Secondary | ICD-10-CM | POA: Diagnosis not present

## 2017-03-31 DIAGNOSIS — N182 Chronic kidney disease, stage 2 (mild): Secondary | ICD-10-CM

## 2017-03-31 DIAGNOSIS — I10 Essential (primary) hypertension: Secondary | ICD-10-CM | POA: Diagnosis not present

## 2017-03-31 LAB — BASIC METABOLIC PANEL
Anion gap: 12 (ref 5–15)
BUN: 18 mg/dL (ref 6–20)
CALCIUM: 9.3 mg/dL (ref 8.9–10.3)
CO2: 23 mmol/L (ref 22–32)
CREATININE: 1.67 mg/dL — AB (ref 0.44–1.00)
Chloride: 100 mmol/L — ABNORMAL LOW (ref 101–111)
GFR calc Af Amer: 33 mL/min — ABNORMAL LOW (ref >60)
GFR, EST NON AFRICAN AMERICAN: 28 mL/min — AB (ref >60)
GLUCOSE: 96 mg/dL (ref 65–99)
Potassium: 3.5 mmol/L (ref 3.5–5.1)
SODIUM: 135 mmol/L (ref 135–145)

## 2017-03-31 LAB — PROTIME-INR
INR: 2.17
PROTHROMBIN TIME: 24 s — AB (ref 11.4–15.2)

## 2017-03-31 MED ORDER — MAGNESIUM SULFATE 2 GM/50ML IV SOLN
2.0000 g | Freq: Once | INTRAVENOUS | Status: AC
  Administered 2017-03-31: 2 g via INTRAVENOUS
  Filled 2017-03-31: qty 50

## 2017-03-31 MED ORDER — POTASSIUM CHLORIDE CRYS ER 20 MEQ PO TBCR
40.0000 meq | EXTENDED_RELEASE_TABLET | Freq: Once | ORAL | Status: AC
  Administered 2017-03-31: 40 meq via ORAL
  Filled 2017-03-31: qty 2

## 2017-03-31 MED ORDER — DILTIAZEM HCL 60 MG PO TABS
60.0000 mg | ORAL_TABLET | Freq: Four times a day (QID) | ORAL | Status: DC
  Administered 2017-03-31 – 2017-04-01 (×3): 60 mg via ORAL
  Filled 2017-03-31 (×4): qty 1

## 2017-03-31 NOTE — Progress Notes (Signed)
PROGRESS NOTE                                                                                                                                                                                                             Patient Demographics:    Bailey Bishop, is a 77 y.o. female, DOB - 1940/11/10, YSA:[id number]  Admit date - 03/29/2017   Admitting Physician Pratik Darleen Crocker, DO  Outpatient Primary MD for the patient is Sharilyn Sites, MD  LOS - 1  Outpatient Specialists: Dr Lorelee New  Chief Complaint  Patient presents with  . Palpitations       Brief Narrative  77 y/o female with Afib on coumadin ( failed recent DCCV, intolerant to long acting cardizem and amiodarone), GERD, HTN, anxiety presented with shortness of breath on exertion with findings of  rapid Afib. Required Cardizem drip briefly in ED after admitted.   Subjective:   Reports feeling much better. Heart rate still goes up to 120s on minimal exertion.   Assessment  & Plan :    Principal Problem:   Atrial fibrillation with RVR (HCC) Recurrent symptoms with failed DCCV in 01/2017. Off cardizem drip, now on oral cardizem 60 mg q6h. metoprolol dose was  recently increased by her cardiologist to 37.5 mg bid but says she could not tolerate with dizziness and ?tinnitus. Continue metoprolol 25 mg bid.  Intolerant to amiodarone due to N/V, intolerant to long active cardizem due to diarrhea.  Placed her on Cardizem 30 mg every 6 hours along with metoprolol. Heart rate still going up to 120s. Will increase Cardizem dose to 60 [place] every 6 hours. Continue anticoagulation.  Will discuss with her cardiologist tomorrow on suitable regimen for home.  Active Problems:  Shortness of breath  Secondary to rapid Afib, no sighs of heart failure. Currently resolved.      Essential hypertension Stable, continue home meds    CKD (chronic kidney disease), stage  II At baseline    Hypokalemia replenished         Code Status : full code  Family Communication  : None at bedside  Disposition Plan  : home tomorrow if heart rate stable.  Barriers For Discharge : active symptoms  Consults  :  none  Procedures  : none  DVT Prophylaxis  :  coumadin  Lab Results  Component Value Date   PLT 251 03/30/2017    Antibiotics  :    Anti-infectives (From admission, onward)   None        Objective:   Vitals:   03/30/17 1739 03/30/17 2217 03/31/17 0701 03/31/17 1016  BP: 122/86 (!) 122/93 119/78   Pulse:  (!) 104 (!) 102   Resp:  20 20   Temp:      TempSrc:      SpO2:  90% 95% 95%  Weight:      Height:        Wt Readings from Last 3 Encounters:  03/30/17 65.8 kg (145 lb)  03/12/17 66.7 kg (147 lb)  01/31/17 68.9 kg (152 lb)     Intake/Output Summary (Last 24 hours) at 03/31/2017 1037 Last data filed at 03/30/2017 1300 Gross per 24 hour  Intake 120 ml  Output -  Net 120 ml     Physical Exam Gen.: Elderly female not in distress HEENT: Moist mucosa, supple neck Chest: Clear bilaterally CVS: S1 and S2 irregular, no murmurs GI: Soft, nondistended, nontender Musculoskeletal : Warm, no edema     Data Review:    CBC Recent Labs  Lab 03/29/17 1305 03/30/17 0447  WBC 8.3 9.6  HGB 11.4* 11.6*  HCT 37.0 36.3  PLT 246 251  MCV 90.7 89.6  MCH 27.9 28.6  MCHC 30.8 32.0  RDW 16.6* 16.6*    Chemistries  Recent Labs  Lab 03/29/17 1305 03/30/17 0447 03/31/17 0749  NA 138 137 135  K 3.4* 3.3* 3.5  CL 101 100* 100*  CO2 25 23 23   GLUCOSE 111* 119* 96  BUN 19 15 18   CREATININE 1.54* 1.42* 1.67*  CALCIUM 9.2 9.5 9.3  MG  --  1.9  --    ------------------------------------------------------------------------------------------------------------------ No results for input(s): CHOL, HDL, LDLCALC, TRIG, CHOLHDL, LDLDIRECT in the last 72 hours.  Lab Results  Component Value Date   HGBA1C 4.9 09/23/2016    ------------------------------------------------------------------------------------------------------------------ No results for input(s): TSH, T4TOTAL, T3FREE, THYROIDAB in the last 72 hours.  Invalid input(s): FREET3 ------------------------------------------------------------------------------------------------------------------ No results for input(s): VITAMINB12, FOLATE, FERRITIN, TIBC, IRON, RETICCTPCT in the last 72 hours.  Coagulation profile Recent Labs  Lab 03/29/17 1305 03/30/17 0447 03/31/17 0749  INR 2.61 2.26 2.17    No results for input(s): DDIMER in the last 72 hours.  Cardiac Enzymes Recent Labs  Lab 03/29/17 1305  TROPONINI <0.03   ------------------------------------------------------------------------------------------------------------------    Component Value Date/Time   BNP 963.0 (H) 03/29/2017 1305    Inpatient Medications  Scheduled Meds: . ALPRAZolam  0.5 mg Oral QHS  . diltiazem  60 mg Oral Q6H  . fluticasone  2 spray Each Nare Daily  . fluticasone furoate-vilanterol  1 puff Inhalation Daily  . furosemide  40 mg Oral Daily  . metoprolol tartrate  25 mg Oral BID  . pantoprazole  40 mg Oral Daily  . potassium chloride SA  40 mEq Oral Daily  . warfarin  2.5 mg Oral Once per day on Mon Tue Thu Fri Sat  . warfarin  5 mg Oral Once per day on Sun Wed  . Warfarin - Pharmacist Dosing Inpatient   Does not apply q1800   Continuous Infusions:  PRN Meds:.acetaminophen, ondansetron (ZOFRAN) IV  Micro Results No results found for this or any previous visit (from the past 240 hour(s)).  Radiology Reports Dg Chest 2 View  Result Date: 03/29/2017 CLINICAL DATA:  Palpitations. Shortness of breath. Dizziness. Nausea. EXAM: CHEST  2 VIEW COMPARISON:  09/23/2016. FINDINGS: Mediastinum and hilar structures normal. Cardiomegaly with normal pulmonary vascularity. Bilateral pleural-parenchymal thickening noted consistent scarring. Diffuse degenerative  change thoracic spine. Surgical clips right upper quadrant . IMPRESSION: 1. Cardiomegaly.  No pulmonary venous congestion. 2. Bilateral pleural-parenchymal thickening consistent with scarring. COPD. Electronically Signed   By: Marcello Moores  Register   On: 03/29/2017 12:36    Time Spent in minutes  25   Isabela Nardelli M.D on 03/31/2017 at 10:37 AM  Between 7am to 7pm - Pager - [phone]  After 7pm go to [email or web address] - password [facility]  [facility] -  Office  [phone]

## 2017-03-31 NOTE — Care Management Note (Signed)
Case Management Note  Patient Details  Name: Bailey Bishop MRN: [id number] Date of Birth: 21-Dec-1940  Subjective/Objective: code 44/MOON notice                   Action/Plan:Apoke with Cristela Blue, house [facility]. He will pass information onto [facility] and give to [facility] in the AM   Expected Discharge Date:                  Expected Discharge Plan:     In-House Referral:     Discharge planning Services     Post Acute Care Choice:    Choice offered to:     DME Arranged:    DME Agency:     HH Arranged:    HH Agency:     Status of Service:     If discussed at [employer], dates discussed:    Additional Comments:  Ival Bible, RN 03/31/2017, 7:39 PM

## 2017-03-31 NOTE — Progress Notes (Signed)
[place] for warfarin Indication: atrial fibrillation  Allergies  Allergen Reactions  . Cardizem Cd [Diltiazem Hcl Er Beads] Diarrhea  . Amiodarone Nausea And Vomiting  . Sulfa Antibiotics Nausea And Vomiting  . Carvedilol Rash  . Chocolate Rash    Dark chocolate    Patient Measurements: Height: 5\' 4"  (162.6 cm) Weight: 145 lb (65.8 kg) IBW/kg (Calculated) : 54.7   Vital Signs: BP: 119/78 (01/06 0701) Pulse Rate: 102 (01/06 0701)  Labs: Recent Labs    03/29/17 1305 03/30/17 0447 03/31/17 0749  HGB 11.4* 11.6*  --   HCT 37.0 36.3  --   PLT 246 251  --   LABPROT 27.8* 24.8* 24.0*  INR 2.61 2.26 2.17  CREATININE 1.54* 1.42* 1.67*  TROPONINI <0.03  --   --     Estimated Creatinine Clearance: 25.9 mL/min (A) (by C-G formula based on SCr of 1.67 mg/dL (H)).   Medical History: Past Medical History:  Diagnosis Date  . Anxiety   . Arthritis   . Asthma   . Atrial fibrillation ([place])   . Cancer [facility]) 2001   right breast  . Dyspnea   . GERD (gastroesophageal reflux disease)   . Hypertension     Medications:  Medications Prior to Admission  Medication Sig Dispense Refill Last Dose  . acetaminophen (TYLENOL) 650 MG CR tablet Take 650 mg daily as needed by mouth for pain.    unknown  . albuterol (PROVENTIL HFA;VENTOLIN HFA) 108 (90 BASE) MCG/ACT inhaler Inhale 2 puffs every 6 (six) hours as needed into the lungs for wheezing or shortness of breath.    03/28/2017 at Unknown time  . ALPRAZolam (XANAX) 0.5 MG tablet Take 0.5 mg at bedtime by mouth.    Past Week at Unknown time  . fluticasone (FLONASE) 50 MCG/ACT nasal spray Place 2 sprays daily into both nostrils.   03/28/2017 at Unknown time  . furosemide (LASIX) 40 MG tablet Take 1 tablet (40 mg total) by mouth daily. 30 tablet 6 03/29/2017 at Unknown time  . metoprolol tartrate (LOPRESSOR) 50 MG tablet Take 1 1/2 tabs (37.5mg ) twice a day 90 tablet 6 03/29/2017 at Unknown time  .  omeprazole (PRILOSEC) 20 MG capsule Take 20 mg daily as needed by mouth (indigestion).    unknown  . potassium chloride SA (K-DUR,KLOR-CON) 20 MEQ tablet Take 1 tablet (20 mEq total) by mouth once. (Patient taking differently: Take 20 mEq daily by mouth. ) 90 tablet 3 03/29/2017 at Unknown time  . warfarin (COUMADIN) 5 MG tablet Take 1/2 tablet daily except 1 tablet on Sundays and Wednesdays 30 tablet 3 03/29/2017 at Unknown time  . BREO ELLIPTA 100-25 MCG/INH AEPB Inhale 1 puff into the lungs daily as needed.    Taking  . ondansetron (ZOFRAN) 4 MG tablet Take 1 tablet (4 mg total) by mouth every 8 (eight) hours as needed for nausea or vomiting. (Patient not taking: Reported on 03/29/2017) 12 tablet 0 Completed Course at Unknown time    Assessment: 77 yo F on warfarin PTA for afib.  Home dose: 2.5 mg daily except 5 mg Wed/Sun.  Last dose 1/4.  INR remains therpaeutic   Goal of Therapy:  INR 2-3 Monitor platelets by anticoagulation protocol: Yes   Plan:  Continue home dose Check INR in am  Zaleah Ternes Poteet 03/31/2017,10:04 AM

## 2017-03-31 NOTE — Progress Notes (Signed)
Central Telemetry contact this nurse and reported patient had a 6 beat run of V-tach, made MD aware.

## 2017-03-31 NOTE — Progress Notes (Signed)
MD ordered to give 1400 potassium now.

## 2017-04-01 DIAGNOSIS — I10 Essential (primary) hypertension: Secondary | ICD-10-CM | POA: Diagnosis not present

## 2017-04-01 DIAGNOSIS — N183 Chronic kidney disease, stage 3 (moderate): Secondary | ICD-10-CM

## 2017-04-01 DIAGNOSIS — I472 Ventricular tachycardia: Secondary | ICD-10-CM | POA: Diagnosis not present

## 2017-04-01 DIAGNOSIS — I4891 Unspecified atrial fibrillation: Secondary | ICD-10-CM | POA: Diagnosis not present

## 2017-04-01 DIAGNOSIS — N178 Other acute kidney failure: Secondary | ICD-10-CM | POA: Diagnosis not present

## 2017-04-01 DIAGNOSIS — I5032 Chronic diastolic (congestive) heart failure: Secondary | ICD-10-CM

## 2017-04-01 DIAGNOSIS — E876 Hypokalemia: Secondary | ICD-10-CM | POA: Diagnosis not present

## 2017-04-01 LAB — BASIC METABOLIC PANEL
ANION GAP: 7 (ref 5–15)
BUN: 27 mg/dL — ABNORMAL HIGH (ref 6–20)
CALCIUM: 9 mg/dL (ref 8.9–10.3)
CO2: 24 mmol/L (ref 22–32)
Chloride: 102 mmol/L (ref 101–111)
Creatinine, Ser: 2.24 mg/dL — ABNORMAL HIGH (ref 0.44–1.00)
GFR calc non Af Amer: 20 mL/min — ABNORMAL LOW (ref >60)
GFR, EST AFRICAN AMERICAN: 23 mL/min — AB (ref >60)
GLUCOSE: 101 mg/dL — AB (ref 65–99)
POTASSIUM: 4.9 mmol/L (ref 3.5–5.1)
SODIUM: 133 mmol/L — AB (ref 135–145)

## 2017-04-01 LAB — PROTIME-INR
INR: 2.32
PROTHROMBIN TIME: 25.3 s — AB (ref 11.4–15.2)

## 2017-04-01 MED ORDER — DILTIAZEM HCL 60 MG PO TABS: 60.0000 mg | ORAL_TABLET | Freq: Three times a day (TID) | ORAL | 0 refills | Status: DC

## 2017-04-01 MED ORDER — WARFARIN SODIUM 2.5 MG PO TABS: 2.5000 mg | ORAL_TABLET | Freq: Once | ORAL | Status: DC

## 2017-04-01 MED ORDER — DILTIAZEM HCL 60 MG PO TABS: 60.0000 mg | ORAL_TABLET | Freq: Three times a day (TID) | ORAL | Status: DC

## 2017-04-01 MED ORDER — WARFARIN - PHARMACIST DOSING INPATIENT: Status: DC

## 2017-04-01 MED ORDER — METOPROLOL TARTRATE 50 MG PO TABS: 25.0000 mg | ORAL_TABLET | Freq: Two times a day (BID) | ORAL | 6 refills | Status: DC

## 2017-04-01 MED ORDER — FUROSEMIDE 40 MG PO TABS: 40.0000 mg | ORAL_TABLET | Freq: Every day | ORAL | 6 refills | Status: DC

## 2017-04-01 NOTE — Discharge Summary (Signed)
Physician Discharge Summary  Bailey Bishop VPX:[id number] DOB: [birth year] DOA: 03/29/2017  PCP: Bailey Sites, MD  Admit date: 03/29/2017 Discharge date: 04/01/2017  Admitted From: Home Disposition:  Home  Recommendations for Outpatient Follow-up:  1. Follow up with PCP in 1-2 weeks. Patient instructed to hold Lasix for another 3 days. 2. Patient has appointment with cardiology on 1/29.  Home Health: None Equipment/Devices: None  Discharge Condition: Fair CODE STATUS: Full code Diet recommendation: Heart Healthy    Discharge Diagnoses:  Principal Problem:   Atrial fibrillation with RVR (HCC)  Active Problems:   GERD (gastroesophageal reflux disease)   Shortness of breath   Essential hypertension   CKD (chronic kidney disease), stage III   Hypokalemia   COPD (chronic obstructive pulmonary disease) (HCC)   Persistent atrial fibrillation (HCC)   Brief narrative/history of present illness Please refer to admission H&P for details, in brief,77 y/o female with Afib on coumadin ( failed recent DCCV, intolerant to long acting cardizem and amiodarone), GERD, HTN, anxiety presented with shortness of breath on exertion with findings of  rapid Afib. Patient has persistent A. fib with issues with frequent RVR and intolerant to several medications. Patient's cardiologist recently increased her metoprolol dose to 37.5 mg twice a day but she could not tolerated due to dizziness.  Required Cardizem drip briefly in ED after being admitted.  Hospital course  Principal Problem:   Atrial fibrillation with RVR (HCC) Recurrent symptoms with failed DCCV in 01/2017. Off cardizem drip, placed on oral cardizem 60 mg q6h. metoprolol dose was  recently increased by her cardiologist to 37.5 mg bid but says she could not tolerate with dizziness and ?tinnitus. Resume metoprolol at 25 mg twice a day. Intolerant to amiodarone due to N/V, intolerant to long active cardizem due to diarrhea.  Heart rate  improved on Cardizem 60 mg to 6 hours but occasionally dropping to high 40s and 50s while asleep.  Appreciate cardiology evaluation and patient will be discharged on oral Cardizem 60 mg every 8 hours. She will follow-up with cardiology in 2 weeks. Continue anticoagulation.  Will discuss with her cardiologist tomorrow on suitable regimen for home.  Active Problems:  Shortness of breath  Secondary to rapid Afib, no sighs of heart failure. Currently resolved.      Essential hypertension Stable, continue home meds  Acute on chronic kidney disease stage III   worsened renal function this morning. Baseline creatinine around 1.5-1.6. Holding Lasix for next 3-4 days and should have renal function checked as outpatient.  NSVT Replenished low potassium and magnesium.        Family Communication  : None at bedside  Disposition Plan  : home  Consults  :   Cardiology  Procedures  : none     Discharge Instructions   Allergies as of 04/01/2017      Reactions   Cardizem Cd [diltiazem Hcl Er Beads] Diarrhea   Amiodarone Nausea And Vomiting   Sulfa Antibiotics Nausea And Vomiting   Carvedilol Rash   Chocolate Rash   Dark chocolate      Medication List    STOP taking these medications   ondansetron 4 MG tablet Commonly known as:  ZOFRAN     TAKE these medications   acetaminophen 650 MG CR tablet Commonly known as:  TYLENOL Take 650 mg daily as needed by mouth for pain.   albuterol 108 (90 Base) MCG/ACT inhaler Commonly known as:  PROVENTIL HFA;VENTOLIN HFA Inhale 2 puffs every 6 (six) hours as  needed into the lungs for wheezing or shortness of breath.   ALPRAZolam 0.5 MG tablet Commonly known as:  XANAX Take 0.5 mg at bedtime by mouth.   BREO ELLIPTA 100-25 MCG/INH Aepb Generic drug:  fluticasone furoate-vilanterol Inhale 1 puff into the lungs daily as needed.   diltiazem 60 MG tablet Commonly known as:  CARDIZEM Take 1 tablet (60 mg total) by  mouth every 8 (eight) hours.   fluticasone 50 MCG/ACT nasal spray Commonly known as:  FLONASE Place 2 sprays daily into both nostrils.   furosemide 40 MG tablet Commonly known as:  LASIX Take 1 tablet (40 mg total) by mouth daily. Start taking on:  04/04/2017 What changed:  These instructions start on 04/04/2017. If you are unsure what to do until then, ask your doctor or other care provider.   metoprolol tartrate 50 MG tablet Commonly known as:  LOPRESSOR Take 0.5 tablets (25 mg total) by mouth 2 (two) times daily. What changed:    how much to take  how to take this  when to take this  additional instructions   omeprazole 20 MG capsule Commonly known as:  PRILOSEC Take 20 mg daily as needed by mouth (indigestion).   potassium chloride SA 20 MEQ tablet Commonly known as:  K-DUR,KLOR-CON Take 1 tablet (20 mEq total) by mouth once. What changed:  when to take this   warfarin 5 MG tablet Commonly known as:  COUMADIN Take as directed. If you are unsure how to take this medication, talk to your nurse or doctor. Original instructions:  Take 1/2 tablet daily except 1 tablet on Sundays and Wednesdays      Follow-up Information    Bailey Heritage, PA-C Follow up.   Specialties:  Physician Assistant, Cardiology Why:  Cardiology Follow-Up on 04/23/2017 at 1:00PM.  Contact information: [place] [address] [phone]        Bailey Sites, MD. Schedule an appointment as soon as possible for a visit in 1 week(s).   Specialty:  Family Medicine Contact information: [address] [place] [address] [phone]          Allergies  Allergen Reactions  . Cardizem Cd [Diltiazem Hcl Er Beads] Diarrhea  . Amiodarone Nausea And Vomiting  . Sulfa Antibiotics Nausea And Vomiting  . Carvedilol Rash  . Chocolate Rash    Dark chocolate     Procedures/Studies: Dg Chest 2 View  Result Date: 03/29/2017 CLINICAL DATA:  Palpitations. Shortness of  breath. Dizziness. Nausea. EXAM: CHEST  2 VIEW COMPARISON:  09/23/2016. FINDINGS: Mediastinum and hilar structures normal. Cardiomegaly with normal pulmonary vascularity. Bilateral pleural-parenchymal thickening noted consistent scarring. Diffuse degenerative change thoracic spine. Surgical clips right upper quadrant . IMPRESSION: 1. Cardiomegaly.  No pulmonary venous congestion. 2. Bilateral pleural-parenchymal thickening consistent with scarring. COPD. Electronically Signed   By: Marcello Moores  Register   On: 03/29/2017 12:36       Subjective: Reports feeling much better today. Heart rate occasionally dropping down to high 40s to low 50s on monitor while she is asleep. Found to have short run of V. tach yesterday for which her potassium and magnesium were supplemented.  Discharge Exam: Vitals:   04/01/17 0023 04/01/17 0624  BP: (!) 117/58 110/70  Pulse: 78 90  Resp:  18  Temp:  98 F (36.7 C)  SpO2:  97%   Vitals:   03/31/17 1702 03/31/17 2100 04/01/17 0023 04/01/17 0624  BP: 115/76 121/75 (!) 117/58 110/70  Pulse:  90 78  90  Resp:  20  18  Temp:  98.7 F (37.1 C)  98 F (36.7 C)  TempSrc:  Oral  Oral  SpO2:  96%  97%  Weight:      Height:         Gen.: Elderly female not in distress HEENT: Moist mucosa, supple neck Chest: Clear bilaterally, no added sounds CVS: S1 and S2 irregular, no murmurs GI: Soft, nondistended, nontender Musculoskeletal : Warm, no edema      The results of significant diagnostics from this hospitalization (including imaging, microbiology, ancillary and laboratory) are listed below for reference.     Microbiology: No results found for this or any previous visit (from the past 240 hour(s)).   Labs: BNP (last 3 results) Recent Labs    09/23/16 1344 03/29/17 1305  BNP 709.0* 161.0*   Basic Metabolic Panel: Recent Labs  Lab 03/29/17 1305 03/30/17 0447 03/31/17 0749 04/01/17 0552  NA 138 137 135 133*  K 3.4* 3.3* 3.5 4.9  CL 101 100*  100* 102  CO2 25 23 23 24   GLUCOSE 111* 119* 96 101*  BUN 19 15 18  27*  CREATININE 1.54* 1.42* 1.67* 2.24*  CALCIUM 9.2 9.5 9.3 9.0  MG  --  1.9  --   --    Liver Function Tests: No results for input(s): AST, ALT, ALKPHOS, BILITOT, PROT, ALBUMIN in the last 168 hours. No results for input(s): LIPASE, AMYLASE in the last 168 hours. No results for input(s): AMMONIA in the last 168 hours. CBC: Recent Labs  Lab 03/29/17 1305 03/30/17 0447  WBC 8.3 9.6  HGB 11.4* 11.6*  HCT 37.0 36.3  MCV 90.7 89.6  PLT 246 251   Cardiac Enzymes: Recent Labs  Lab 03/29/17 1305  TROPONINI <0.03   BNP: Invalid input(s): POCBNP CBG: No results for input(s): GLUCAP in the last 168 hours. D-Dimer No results for input(s): DDIMER in the last 72 hours. Hgb A1c No results for input(s): HGBA1C in the last 72 hours. Lipid Profile No results for input(s): CHOL, HDL, LDLCALC, TRIG, CHOLHDL, LDLDIRECT in the last 72 hours. Thyroid function studies No results for input(s): TSH, T4TOTAL, T3FREE, THYROIDAB in the last 72 hours.  Invalid input(s): FREET3 Anemia work up No results for input(s): VITAMINB12, FOLATE, FERRITIN, TIBC, IRON, RETICCTPCT in the last 72 hours. Urinalysis    Component Value Date/Time   COLORURINE YELLOW 03/29/2017 1930   APPEARANCEUR CLEAR 03/29/2017 1930   LABSPEC 1.011 03/29/2017 1930   PHURINE 6.0 03/29/2017 1930   GLUCOSEU NEGATIVE 03/29/2017 1930   HGBUR SMALL (A) 03/29/2017 [place] NEGATIVE 03/29/2017 1930   KETONESUR 5 (A) 03/29/2017 1930   PROTEINUR NEGATIVE 03/29/2017 1930   NITRITE NEGATIVE 03/29/2017 1930   LEUKOCYTESUR NEGATIVE 03/29/2017 1930   Sepsis Labs Invalid input(s): PROCALCITONIN,  WBC,  LACTICIDVEN Microbiology No results found for this or any previous visit (from the past 240 hour(s)).   Time coordinating discharge: < 30 minutes  SIGNED:   Louellen Molder, MD  [facility] 04/01/2017, 11:04 AM Pager   If 7PM-7AM, please  contact night-coverage [email or web address] Password [email or web address]

## 2017-04-01 NOTE — Care Management Obs Status (Signed)
[facility] NOTIFICATION   Patient Details  Name: Bailey Bishop MRN: [id number] Date of Birth: May 28, 1940   Medicare Observation Status Notification Given:  Yes    Sherald Barge, RN 04/01/2017, 11:36 AM

## 2017-04-01 NOTE — Progress Notes (Signed)
Patient states understanding of discharge instructions, prescription given. 

## 2017-04-01 NOTE — Consult Note (Signed)
Cardiology Consult    [name]: REVE CROCKET; [id number]; 1940-09-29   Admit date: 03/29/2017 Date of Consult: 04/01/2017  Primary Care Provider: Sharilyn Sites, MD Primary Cardiologist: [name] Electrophysiologist: Has been evaluated by [name], NP in the [facility]  Patient Profile    Bailey Bishop is a 77 y.o. female with past medical history of persistent atrial fibrillation (on Coumadin for anticoagulation), HTN, chronic diastolic CHF, Stage 3 CKD, and GERD who is being seen today for the evaluation of atrial fibrillation at the request of Dr. Clementeen Graham.   History of Present Illness    Bailey Bishop was referred to the atrial fibrillation clinic in 12/2016 and was initially on Amiodarone but was intolerant to the medication. Ablation and other antiarrhythmic medications were reviewed but she declined. A DCCV was reviewed and she was in agreement to proceed but there was concern about her ability to maintain NSR with her severely dilated LA. This was attempted on 02/18/2017 but was unsuccessful at 120J x3 and 200J x2.   At the time of her follow-up visit with [name] on 03/12/2017, she reported having continual weakness and exertional dyspnea. HR was in the low-100's at the time of her visit. She was not interested in an ablation, therefore continued rate-control was recommended. She had been unable to tolerate Cardizem in the past, therefore Lopressor was titrated from 25mg  BID to 37.5mg  BID.   She presented to [name] ED on 03/29/2017 for worsening palpitations and nausea. Reported having dizziness and tinnitus with the recently increased Lopressor dosing so she self-decreased this back to 25mg  BID in the interim. Her HR was in the 120's upon arrival and she was started on IV Cardizem.   Over the weekend, she has been weaned from the drip and is now on PO Cardizem 60mg  Q6H. BNP was elevated to 963 on admission and she was continued on PTA  Lasix 40mg  daily. Most recent labs this morning showed Na+ 133, K+ 4.9, and creatinine 2.24 (at 1.54 on admission).   She reports improvement in her dyspnea and palpitations with the above medication adjustments. Denies any recent chest pain, lower extremity edema, orthopnea, or PND. HR has been well-controlled in the mid-50's to 80's on telemetry while on current Lopressor and Cardizem dosing.    Past Medical History:  Diagnosis Date  . Anxiety   . Arthritis   . Asthma   . Atrial fibrillation ([place])   . Cancer [name]) 2001   right breast  . Dyspnea   . GERD (gastroesophageal reflux disease)   . Hypertension     Past Surgical History:  Procedure Laterality Date  . CARDIOVERSION N/A 02/18/2017   Procedure: CARDIOVERSION;  Surgeon: Josue Hector, MD;  Location: [name] ENDOSCOPY;  Service: Cardiovascular;  Laterality: N/A;  . CATARACT EXTRACTION W/PHACO  07/23/2011   Procedure: CATARACT EXTRACTION PHACO AND INTRAOCULAR LENS PLACEMENT ([place]);  Surgeon: Williams Che, MD;  Location: [facility] ORS;  Service: Ophthalmology;  Laterality: Right;  CDE:9.96  . CHOLECYSTECTOMY N/A 02/14/2015   Procedure: LAPAROSCOPIC CHOLECYSTECTOMY;  Surgeon: Aviva Signs, MD;  Location: [facility] ORS;  Service: General;  Laterality: N/A;  . MASTECTOMY     bilateral mastectomy-right breast cancer-left taken by choice  . SEPTOPLASTY  1995     Home Medications:  Prior to Admission medications   Medication Sig Start Date End Date Taking? Authorizing Provider  acetaminophen (TYLENOL) 650 MG CR tablet Take 650 mg daily as needed by mouth for pain.  Yes [provider]  albuterol (PROVENTIL HFA;VENTOLIN HFA) 108 (90 BASE) MCG/ACT inhaler Inhale 2 puffs every 6 (six) hours as needed into the lungs for wheezing or shortness of breath.    Yes [provider]  ALPRAZolam Duanne Moron) 0.5 MG tablet Take 0.5 mg at bedtime by mouth.  06/18/14  Yes [provider]  fluticasone (FLONASE) 50 MCG/ACT nasal spray Place  2 sprays daily into both nostrils. 01/29/17  Yes [provider]  furosemide (LASIX) 40 MG tablet Take 1 tablet (40 mg total) by mouth daily. 01/18/17  Yes Herminio Commons, MD  metoprolol tartrate (LOPRESSOR) 50 MG tablet Take 1 1/2 tabs (37.5mg ) twice a day 03/12/17  Yes Herminio Commons, MD  omeprazole (PRILOSEC) 20 MG capsule Take 20 mg daily as needed by mouth (indigestion).  02/14/16  Yes [provider]  potassium chloride SA (K-DUR,KLOR-CON) 20 MEQ tablet Take 1 tablet (20 mEq total) by mouth once. Patient taking differently: Take 20 mEq daily by mouth.  10/25/16 01/22/18 Yes Lendon Colonel, NP  warfarin (COUMADIN) 5 MG tablet Take 1/2 tablet daily except 1 tablet on Sundays and Wednesdays 03/13/17  Yes Satira Sark, MD  BREO ELLIPTA 100-25 MCG/INH AEPB Inhale 1 puff into the lungs daily as needed.  01/31/15   [provider]  ondansetron (ZOFRAN) 4 MG tablet Take 1 tablet (4 mg total) by mouth every 8 (eight) hours as needed for nausea or vomiting. Patient not taking: Reported on 03/29/2017 01/22/17   Dorie Rank, MD    Inpatient Medications: Scheduled Meds: . ALPRAZolam  0.5 mg Oral QHS  . diltiazem  60 mg Oral Q6H  . fluticasone  2 spray Each Nare Daily  . fluticasone furoate-vilanterol  1 puff Inhalation Daily  . furosemide  40 mg Oral Daily  . metoprolol tartrate  25 mg Oral BID  . pantoprazole  40 mg Oral Daily  . potassium chloride SA  40 mEq Oral Daily  . warfarin  2.5 mg Oral Once per day on Mon Tue Thu Fri Sat  . warfarin  5 mg Oral Once per day on Sun Wed  . Warfarin - Pharmacist Dosing Inpatient   Does not apply q1800   Continuous Infusions:  PRN Meds: acetaminophen, ondansetron (ZOFRAN) IV  Allergies:    Allergies  Allergen Reactions  . Cardizem Cd [Diltiazem Hcl Er Beads] Diarrhea  . Amiodarone Nausea And Vomiting  . Sulfa Antibiotics Nausea And Vomiting  . Carvedilol Rash  . Chocolate Rash    Dark chocolate     Social History:   Social History   Socioeconomic History  . Marital status: Married    Spouse name: Not on file  . Number of children: Not on file  . Years of education: Not on file  . Highest education level: Not on file  Social Needs  . Financial resource strain: Not on file  . Food insecurity - worry: Not on file  . Food insecurity - inability: Not on file  . Transportation needs - medical: Not on file  . Transportation needs - non-medical: Not on file  Occupational History  . Not on file  Tobacco Use  . Smoking status: Former Smoker    Packs/day: 1.00    Years: 28.00    Pack years: 28.00    Types: Cigarettes    Last attempt to quit: 07/17/1983    Years since quitting: 33.7  . Smokeless tobacco: Never Used  Substance and Sexual Activity  . Alcohol use:  No  . Drug use: No  . Sexual activity: No    Birth control/protection: Post-menopausal  Other Topics Concern  . Not on file  Social History Narrative  . Not on file     Family History:    Family History  Problem Relation Age of Onset  . Cancer Mother   . Aneurysm Father   . Cancer Sister   . Cancer Sister   . Anesthesia problems Neg Hx   . Hypotension Neg Hx   . Malignant hyperthermia Neg Hx   . Pseudochol deficiency Neg Hx       Review of Systems    General:  No chills, fever, night sweats or weight changes.  Cardiovascular:  No chest pain, edema, orthopnea, paroxysmal nocturnal dyspnea. Positive for dyspnea on exertion and palpitations.  Dermatological: No rash, lesions/masses Respiratory: No cough, dyspnea Urologic: No hematuria, dysuria Abdominal:   No nausea, vomiting, diarrhea, bright red blood per rectum, melena, or hematemesis Neurologic:  No visual changes, wkns, changes in mental status. All other systems reviewed and are otherwise negative except as noted above.  Physical Exam/Data    Vitals:   03/31/17 1702 03/31/17 2100 04/01/17 0023 04/01/17 0624  BP: 115/76 121/75 (!) 117/58  110/70  Pulse:  90 78 90  Resp:  20  18  Temp:  98.7 F (37.1 C)  98 F (36.7 C)  TempSrc:  Oral  Oral  SpO2:  96%  97%  Weight:      Height:        Intake/Output Summary (Last 24 hours) at 04/01/2017 0938 Last data filed at 04/01/2017 0625 Gross per 24 hour  Intake 800 ml  Output -  Net 800 ml   Filed Weights   03/29/17 1215 03/30/17 0800  Weight: 147 lb (66.7 kg) 145 lb (65.8 kg)   Body mass index is 24.89 kg/m.   General: Pleasant elderly Caucasian female appearing in NAD Psych: Normal affect. Neuro: Alert and oriented X 3. Moves all extremities spontaneously. HEENT: Normal  Neck: Supple without bruits or JVD. Lungs:  Resp regular and unlabored, initial scattered rhonchi which cleared with coughing.  Heart: Irregularly irregular, no s3, s4, or murmurs. Abdomen: Soft, non-tender, non-distended, BS + x 4.  Extremities: No clubbing or cyanosis. Trace lower extremity edema. DP/PT/Radials 2+ and equal bilaterally.   EKG:  The EKG was personally reviewed and demonstrates: Atrial fibrillation, HR 104. Telemetry:  Telemetry was personally reviewed and demonstrates: Atrial fibrillation, HR in mid-50's to 80's.    Labs/Studies     Relevant CV Studies:  Echocardiogram: 09/2016 Study Conclusions  - Left ventricle: The cavity size was normal. Wall thickness was   normal. Systolic function was normal. The estimated ejection   fraction was in the range of 55% to 60%. Wall motion was normal;   there were no regional wall motion abnormalities. - Aortic valve: Mildly to moderately calcified annulus. Mildly   thickened leaflets. Valve area (VTI): 2.92 cm^2. Valve area   (Vmax): 2.73 cm^2. Valve area (Vmean): 2.61 cm^2. - Mitral valve: There was moderate regurgitation. - Left atrium: The atrium was severely dilated. - Right atrium: The atrium was severely dilated. - Atrial septum: No defect or patent foramen ovale was identified. - Pulmonary arteries: Systolic pressure was  moderately increased.   PA peak pressure: 49 mm Hg (S). - Pericardium, extracardiac: There is a small circumferential   pericardial effusion. - Technically adequate study.  Laboratory Data:  Chemistry Recent Labs  Lab 03/30/17 [phone] 03/31/17  6213 04/01/17 0552  NA 137 135 133*  K 3.3* 3.5 4.9  CL 100* 100* 102  CO2 23 23 24   GLUCOSE 119* 96 101*  BUN 15 18 27*  CREATININE 1.42* 1.67* 2.24*  CALCIUM 9.5 9.3 9.0  GFRNONAA 34* 28* 20*  GFRAA 40* 33* 23*  ANIONGAP 14 12 7     No results for input(s): PROT, ALBUMIN, AST, ALT, ALKPHOS, BILITOT in the last 168 hours. Hematology Recent Labs  Lab 03/29/17 1305 03/30/17 0447  WBC 8.3 9.6  RBC 4.08 4.05  HGB 11.4* 11.6*  HCT 37.0 36.3  MCV 90.7 89.6  MCH 27.9 28.6  MCHC 30.8 32.0  RDW 16.6* 16.6*  PLT 246 251   Cardiac Enzymes Recent Labs  Lab 03/29/17 1305  TROPONINI <0.03   No results for input(s): TROPIPOC in the last 168 hours.  BNP Recent Labs  Lab 03/29/17 1305  BNP 963.0*    DDimer No results for input(s): DDIMER in the last 168 hours.  Radiology/Studies:  Dg Chest 2 View  Result Date: 03/29/2017 CLINICAL DATA:  Palpitations. Shortness of breath. Dizziness. Nausea. EXAM: CHEST  2 VIEW COMPARISON:  09/23/2016. FINDINGS: Mediastinum and hilar structures normal. Cardiomegaly with normal pulmonary vascularity. Bilateral pleural-parenchymal thickening noted consistent scarring. Diffuse degenerative change thoracic spine. Surgical clips right upper quadrant . IMPRESSION: 1. Cardiomegaly.  No pulmonary venous congestion. 2. Bilateral pleural-parenchymal thickening consistent with scarring. COPD. Electronically Signed   By: Marcello Moores  Register   On: 03/29/2017 12:36     Assessment & Plan    1. Atrial Fibrillation with RVR - the patient has a known history of persistent atrial fibrillation, having failed DCCV in 01/2017. She has declined ablation and was intolerant to Amiodarone in the past (does not wish to try other  antiarrhythmic medications at this time).  - Lopressor was increased from 25mg  BID to 37.5mg  BID as an outpatient but she reports worsening dizziness and diarrhea with this dose adjustment, therefore she self-reduced it back to 25mg  BID. She was initially on IV Cardizem but has transitioned to PO Cardizem 60mg  Q6H. With HR in the 50's at times, will adjust to 60mg  Q8H to to improve compliance (has been intolerant to Cardizem CD in the past). Continue Lopressor 25mg  BID. Could consider Verapamil if she develops an intolerance to this.  - she denies any evidence of active bleeding. Continue Coumadin for anticoagulation.   2. Chronic Diastolic CHF - echo in 10/6576 showed a preserved EF of 55-60% with no regional WMA.  - she appears euvolemic on examination. Baseline weight between 142 - 148 lbs according to the patient, at 145 lbs today.  - creatinine acutely elevated today. Would hold Lasix for 1-2 days at the time of discharge then resume.   3. Stage 3 CKD - baseline 1.5 - 1.6. Elevated to 2.24 this AM.  - can hold Lasix as above and obtain repeat BMET in 1 week.   4. HTN - BP remains well-controlled.  - continue Lopressor and Cardizem at current dosing.   For questions or updates, please contact Bessie Please consult [email or web address] for contact info under Cardiology/STEMI.  Signed, Erma Heritage, PA-C 04/01/2017, 9:38 AM Pager: [phone]   Attending note:  Patient seen and examined. Reviewed records and discussed the case with Strader PA-C. Ms. Manternach presents to the hospital with atrial fibrillation associated with uncontrolled heart rates, palpitations and nausea. She has had reported intolerances of higher dose Lopressor aimed at heart rate control. Under observation in the  hospital she was placed on divided dose Cardizem with prior history of not tolerating long-acting Cardizem CD, and with continuation of lower dose Lopressor her heart rate has come under good  control. She otherwise continues on Coumadin for stroke prophylaxis.  On examination this morning she is comfortable, reports no palpitations and eager to go home. Heart rate is in the 70s to 80s in atrial fib ablation by telemetry which I personally reviewed. Stoll blood pressure 110-120. Lungs are clear without labored breathing. Cardiac exam reveals irregularly irregular rhythm without gallop. Labwork shows renal insufficiency with creatinine 1.67-2.2, INR 2.32.  Patient has a history of persistent atrial fibrillation with prior failed cardioversion attempt in November 2018. Recommend continuing Coumadin for stroke prophylaxis, would go with Lopressor at 25 mg twice daily which she tolerated previously, and try Cardizem 60 mg by mouth every 8 hours for additional heart rate control. Otherwise recommend holding Lasix for a few days in light of progressive renal insufficiency, this can be resumed as an outpatient. We will arrange office follow-up for her and sign off for now.  Satira Sark, M.D., F.A.C.C.

## 2017-04-01 NOTE — Care Management CC44 (Signed)
Condition Code 44 Documentation Completed  Patient Details  Name: Bailey Bishop MRN: [id number] Date of Birth: 23-Mar-1941   Condition Code 44 given:  Yes Patient signature on Condition Code 44 notice:  Yes Documentation of 2 MD's agreement:  Yes Code 44 added to claim:  Yes    Sherald Barge, RN 04/01/2017, 11:37 AM

## 2017-04-01 NOTE — Progress Notes (Signed)
Daisy for warfarin Indication: atrial fibrillation  Allergies  Allergen Reactions  . Cardizem Cd [Diltiazem Hcl Er Beads] Diarrhea  . Amiodarone Nausea And Vomiting  . Sulfa Antibiotics Nausea And Vomiting  . Carvedilol Rash  . Chocolate Rash    Dark chocolate    Patient Measurements: Height: 5\' 4"  (162.6 cm) Weight: 145 lb (65.8 kg) IBW/kg (Calculated) : 54.7   Vital Signs: Temp: 98 F (36.7 C) (01/07 0624) Temp Source: Oral (01/07 0624) BP: 110/70 (01/07 0624) Pulse Rate: 90 (01/07 0624)  Labs: Recent Labs    03/29/17 1305 03/30/17 0447 03/31/17 0749 04/01/17 0552  HGB 11.4* 11.6*  --   --   HCT 37.0 36.3  --   --   PLT 246 251  --   --   LABPROT 27.8* 24.8* 24.0* 25.3*  INR 2.61 2.26 2.17 2.32  CREATININE 1.54* 1.42* 1.67* 2.24*  TROPONINI <0.03  --   --   --     Estimated Creatinine Clearance: 19.3 mL/min (A) (by C-G formula based on SCr of 2.24 mg/dL (H)).  Assessment: 77 yo F on warfarin PTA for afib.  Home dose: 2.5 mg daily except 5 mg Wed/Sun.  INR therapeutic, no bleeding noted.  Goal of Therapy:  INR 2-3 Monitor platelets by anticoagulation protocol: Yes   Plan:  Warfarin 2.5mg  po x1 Daily PT/INR Monitor for signs and symptoms of bleeding.   Biagio Quint R 04/01/2017,10:46 AM

## 2017-04-03 ENCOUNTER — Ambulatory Visit (INDEPENDENT_AMBULATORY_CARE_PROVIDER_SITE_OTHER): Payer: Medicare Other | Admitting: *Deleted

## 2017-04-03 DIAGNOSIS — Z7901 Long term (current) use of anticoagulants: Secondary | ICD-10-CM | POA: Diagnosis not present

## 2017-04-03 DIAGNOSIS — I4891 Unspecified atrial fibrillation: Secondary | ICD-10-CM | POA: Diagnosis not present

## 2017-04-03 LAB — POCT INR: INR: 3.3

## 2017-04-03 NOTE — Patient Instructions (Signed)
Hold coumadin tonight then resume 1/2 tablet daily except 1 tablet on Sundays and Wednesdays Recheck 12/19

## 2017-04-15 ENCOUNTER — Ambulatory Visit (INDEPENDENT_AMBULATORY_CARE_PROVIDER_SITE_OTHER): Payer: Medicare Other | Admitting: *Deleted

## 2017-04-15 DIAGNOSIS — Z7901 Long term (current) use of anticoagulants: Secondary | ICD-10-CM

## 2017-04-15 DIAGNOSIS — I4891 Unspecified atrial fibrillation: Secondary | ICD-10-CM | POA: Diagnosis not present

## 2017-04-15 LAB — POCT INR: INR: 2.3

## 2017-04-15 NOTE — Patient Instructions (Signed)
Continue coumadin 1/2 tablet daily except 1 tablet on Sundays and Wednesdays Recheck 4 weeks

## 2017-04-22 NOTE — Progress Notes (Signed)
Cardiology Office Note    Date:  04/23/2017   ID:  Bailey Bishop, DOB 09-03-1940, MRN [id number]  PCP:  Bailey Sites, MD  Cardiologist: Dr. Bronson Bishop  Chief Complaint  Patient presents with  . Hospitalization Follow-up    History of Present Illness:    Bailey Bishop is a 77 y.o. female with past medical history of persistent atrial fibrillation (on Coumadin for anticoagulation, s/p unsuccessful DCCV in 01/2017 and intolerant to Amiodarone), HTN, chronic diastolic CHF, Stage 3 CKD, and GERD who presents to the office today for hospital follow-up.   She was recently admitted to [name] from 1/4 - 04/01/2017 for evaluation of worsening palpitations and nausea, found to be in atrial fibrillation with RVR, HR in the 120's upon arrival. Was also found to be volume overloaded with a BNP of 963. She was restarted on PTA PO Lasix and volume status improved with this. Initially required IV Cardizem and this was transitioned to short-acting PO Cardizem 60mg  Q8H as she had been intolerant to Cardizem CD in the past. Was continued on Lopressor 25mg  BID as she was intolerant to higher dosing secondary to reported diarrhea and dizziness. Creatinine was elevated at 2.24 on the day of discharge, therefore it was recommended she hold Lasix for 1-2 days at the time of discharge with a repeat BMET in 1 week.   In talking with the patient today, she reports being unable to tolerate short-acting Cardizem due to having "dark stools" and "burning along her rectum" after taking the medication. She stopped the medication and symptoms resolved. She has remained on Lopressor 25mg  BID and is still tolerating the medication at this time.   She has sporadic episodes of dyspnea and palpitations but denies any persistent symptoms. No recent chest pain, orthopnea, PND, or lower extremity edema. She checks her HR regularly and this has been in the 60's to 110's when checked at home.   Does report having insomnia  as she sleeps for 2-3 hour intervals. Takes Xanax PRN at bedtime but says this does not seem to be helping.     Past Medical History:  Diagnosis Date  . Anxiety   . Arthritis   . Asthma   . Atrial fibrillation ([place])    a. s/p unsuccessful DCCV in 01/2017 and intolerant to Amiodarone --> Rate-control strategy pursued since  . Cancer [facility]) 2001   right breast  . Dyspnea   . GERD (gastroesophageal reflux disease)   . Hypertension     Past Surgical History:  Procedure Laterality Date  . CARDIOVERSION N/A 02/18/2017   Procedure: CARDIOVERSION;  Surgeon: Bailey Hector, MD;  Location: [facility] ENDOSCOPY;  Service: Cardiovascular;  Laterality: N/A;  . CATARACT EXTRACTION W/PHACO  07/23/2011   Procedure: CATARACT EXTRACTION PHACO AND INTRAOCULAR LENS PLACEMENT ([place]);  Surgeon: Bailey Che, MD;  Location: [facility] ORS;  Service: Ophthalmology;  Laterality: Right;  CDE:9.96  . CHOLECYSTECTOMY N/A 02/14/2015   Procedure: LAPAROSCOPIC CHOLECYSTECTOMY;  Surgeon: Bailey Signs, MD;  Location: [facility] ORS;  Service: General;  Laterality: N/A;  . MASTECTOMY     bilateral mastectomy-right breast cancer-left taken by choice  . SEPTOPLASTY  1995    Current Medications: Outpatient Medications Prior to Visit  Medication Sig Dispense Refill  . acetaminophen (TYLENOL) 650 MG CR tablet Take 650 mg daily as needed by mouth for pain.     Bailey Bishop albuterol (PROVENTIL HFA;VENTOLIN HFA) 108 (90 BASE) MCG/ACT inhaler Inhale 2 puffs every 6 (six) hours as needed into the  lungs for wheezing or shortness of breath.     . ALPRAZolam (XANAX) 0.5 MG tablet Take 0.5 mg at bedtime by mouth.     Bailey Bishop BREO ELLIPTA 100-25 MCG/INH AEPB Inhale 1 puff into the lungs daily as needed.     . fluticasone (FLONASE) 50 MCG/ACT nasal spray Place 2 sprays daily into both nostrils.    . furosemide (LASIX) 40 MG tablet Take 1 tablet (40 mg total) by mouth daily. 30 tablet 6  . metoprolol tartrate (LOPRESSOR) 50 MG tablet Take 0.5 tablets (25 mg total)  by mouth 2 (two) times daily. 90 tablet 6  . omeprazole (PRILOSEC) 20 MG capsule Take 20 mg daily as needed by mouth (indigestion).     . potassium chloride SA (K-DUR,KLOR-CON) 20 MEQ tablet Take 1 tablet (20 mEq total) by mouth once. (Patient taking differently: Take 20 mEq daily by mouth. ) 90 tablet 3  . warfarin (COUMADIN) 5 MG tablet Take 1/2 tablet daily except 1 tablet on Sundays and Wednesdays 30 tablet 3  . diltiazem (CARDIZEM) 60 MG tablet Take 1 tablet (60 mg total) by mouth every 8 (eight) hours. 90 tablet 0   No facility-administered medications prior to visit.      Allergies:   Cardizem cd [diltiazem hcl er beads]; Amiodarone; Sulfa antibiotics; Carvedilol; and Chocolate   Social History   Socioeconomic History  . Marital status: Married    Spouse name: None  . Number of children: None  . Years of education: None  . Highest education level: None  Social Needs  . Financial resource strain: None  . Food insecurity - worry: None  . Food insecurity - inability: None  . Transportation needs - medical: None  . Transportation needs - non-medical: None  Occupational History  . None  Tobacco Use  . Smoking status: Former Smoker    Packs/day: 1.00    Years: 28.00    Pack years: 28.00    Types: Cigarettes    Last attempt to quit: 07/17/1983    Years since quitting: 33.7  . Smokeless tobacco: Never Used  Substance and Sexual Activity  . Alcohol use: No  . Drug use: No  . Sexual activity: No    Birth control/protection: Post-menopausal  Other Topics Concern  . None  Social History Narrative  . None     Family History:  The patient's family history includes Aneurysm in her father; Cancer in her mother, sister, and sister.   Review of Systems:   Please see the history of present illness.     General:  No chills, fever, night sweats or weight changes.  Cardiovascular:  No chest pain, edema, orthopnea, paroxysmal nocturnal dyspnea. Positive for palpitations and  dyspnea on exertion.  Dermatological: No rash, lesions/masses Respiratory: No cough, dyspnea Urologic: No hematuria, dysuria Abdominal:   No nausea, vomiting, diarrhea, bright red blood per rectum, melena, or hematemesis Neurologic:  No visual changes, wkns, changes in mental status. All other systems reviewed and are otherwise negative except as noted above.   Physical Exam:    VS:  BP 122/68 (BP Location: Right Arm)   Pulse (!) 106   Ht 5\' 4"  (1.626 m)   Wt 152 lb (68.9 kg)   SpO2 98%   BMI 26.09 kg/m    General: Well developed, well nourished Caucasian female appearing in no acute distress. Head: Normocephalic, atraumatic, sclera non-icteric, no xanthomas, nares are without discharge.  Neck: No carotid bruits. JVD not elevated.  Lungs: Respirations regular and  unlabored, without wheezes or rales.  Heart: Irregularly irregular, tachycardiac. No S3 or S4.  No murmur, no rubs, or gallops appreciated. Abdomen: Soft, non-tender, non-distended with normoactive bowel sounds. No hepatomegaly. No rebound/guarding. No obvious abdominal masses. Msk:  Strength and tone appear normal for age. No joint deformities or effusions. Extremities: No clubbing or cyanosis. No lower extremity edema.  Distal pedal pulses are 2+ bilaterally. Neuro: Alert and oriented X 3. Moves all extremities spontaneously. No focal deficits noted. Psych:  Responds to questions appropriately with a normal affect. Skin: No rashes or lesions noted  Wt Readings from Last 3 Encounters:  04/23/17 152 lb (68.9 kg)  03/30/17 145 lb (65.8 kg)  03/12/17 147 lb (66.7 kg)     Studies/Labs Reviewed:   EKG:  EKG is not ordered today.    Recent Labs: 09/26/2016: TSH 1.811 01/22/2017: ALT 16 03/29/2017: B Natriuretic Peptide 963.0 03/30/2017: Hemoglobin 11.6; Magnesium 1.9; Platelets 251 04/23/2017: BUN 19; Creatinine, Ser 1.49; Potassium 3.9; Sodium 139   Lipid Panel    Component Value Date/Time   CHOL 109 09/24/2016 0438    TRIG 66 09/24/2016 0438   HDL 34 (L) 09/24/2016 0438   CHOLHDL 3.2 09/24/2016 0438   VLDL 13 09/24/2016 0438   LDLCALC 62 09/24/2016 0438    Additional studies/ records that were reviewed today include:   Echocardiogram: 09/24/2016 Study Conclusions  - Left ventricle: The cavity size was normal. Wall thickness was   normal. Systolic function was normal. The estimated ejection   fraction was in the range of 55% to 60%. Wall motion was normal;   there were no regional wall motion abnormalities. - Aortic valve: Mildly to moderately calcified annulus. Mildly   thickened leaflets. Valve area (VTI): 2.92 cm^2. Valve area   (Vmax): 2.73 cm^2. Valve area (Vmean): 2.61 cm^2. - Mitral valve: There was moderate regurgitation. - Left atrium: The atrium was severely dilated. - Right atrium: The atrium was severely dilated. - Atrial septum: No defect or patent foramen ovale was identified. - Pulmonary arteries: Systolic pressure was moderately increased.   PA peak pressure: 49 mm Hg (S). - Pericardium, extracardiac: There is a small circumferential   pericardial effusion. - Technically adequate study.   Assessment:    1. Persistent atrial fibrillation ([place])   2. Long term (current) use of anticoagulants   3. Chronic diastolic CHF (congestive heart failure) ([place])   4. Essential hypertension   5. CKD (chronic kidney disease) stage 3, GFR 30-59 ml/min (HCC)   6. Medication management   7. Insomnia, unspecified type      Plan:   In order of problems listed above:  1. Persistent Atrial Fibrillation/ Use of Long-Term Anticoagulation - she underwent multiple unsuccessful DCCV attempts in 01/2017 and was intolerant to Amiodarone in the past, therefore a rate-control strategy has been pursued. This has been challenging as she is noncompliant and abruptly stops medications. Was previously unable to tolerate a higher dose of Lopressor and has remained on Lopressor 25mg  BID. Was started on  short-acting Cardizem during her recent hospitalization (intolerant to Cardizem CD previously) but reports the medication caused "burning along her rectum" and she self-discontinued it. HR in the 90's to low-100's during today's visit. She had previously been on 30mg  BID without symptoms and is in agreement to trying this again. If her symptoms return, would recommend switching to Verapamil. - remains on Coumadin for anticoagulation and denies any evidence of active bleeding.  2. Chronic Diastolic CHF - weight has been stable  on her home scales. She appears euvolemic on examination. - continue Lasix 40mg  daily.   3. HTN - BP is well-controlled at 122/68 during today's visit.  - continue current medication regimen.   4. Stage 3 CKD - Creatinine was elevated at 2.24 on the day of hospital discharge. Baseline is 1.4 - 1.6. - will recheck BMET today.   5. Insomnia - reports having difficulty sleeping at night. Takes Xanax as needed.  - we reviewed healthy sleep habits. Recommended she could try OTC Melatonin and should follow-up with her PCP if symptoms persist.    Medication Adjustments/Labs and Tests Ordered: Current medicines are reviewed at length with the patient today.  Concerns regarding medicines are outlined above.  Medication changes, Labs and Tests ordered today are listed in the Patient Instructions below. Patient Instructions  Your physician recommends that you schedule a follow-up appointment in:  2 months with Dr.Koneswaran  Take Cardizem 30 mg twice a day  Get lab work today BMET  Thank you for [place] !        Signed, Erma Heritage, PA-C  04/23/2017 3:35 PM    [facility] [address] [place], [place] [address] Phone: [phone]

## 2017-04-23 ENCOUNTER — Other Ambulatory Visit (HOSPITAL_COMMUNITY)
Admission: RE | Admit: 2017-04-23 | Discharge: 2017-04-23 | Disposition: A | Discharge status: Home / Self Care | Payer: Medicare Other | Source: Ambulatory Visit | Attending: Student | Admitting: Student

## 2017-04-23 ENCOUNTER — Ambulatory Visit: Payer: Medicare Other | Admitting: Student

## 2017-04-23 ENCOUNTER — Encounter: Payer: Self-pay | Admitting: Student

## 2017-04-23 VITALS — BP 122/68 | HR 106 | Ht 64.0 in | Wt 152.0 lb

## 2017-04-23 DIAGNOSIS — I10 Essential (primary) hypertension: Secondary | ICD-10-CM | POA: Diagnosis not present

## 2017-04-23 DIAGNOSIS — I481 Persistent atrial fibrillation: Secondary | ICD-10-CM

## 2017-04-23 DIAGNOSIS — G4700 Insomnia, unspecified: Secondary | ICD-10-CM | POA: Diagnosis not present

## 2017-04-23 DIAGNOSIS — Z79899 Other long term (current) drug therapy: Secondary | ICD-10-CM | POA: Diagnosis not present

## 2017-04-23 DIAGNOSIS — N183 Chronic kidney disease, stage 3 unspecified: Secondary | ICD-10-CM

## 2017-04-23 DIAGNOSIS — I5032 Chronic diastolic (congestive) heart failure: Secondary | ICD-10-CM

## 2017-04-23 DIAGNOSIS — Z7901 Long term (current) use of anticoagulants: Secondary | ICD-10-CM | POA: Diagnosis not present

## 2017-04-23 DIAGNOSIS — I4819 Other persistent atrial fibrillation: Secondary | ICD-10-CM

## 2017-04-23 LAB — BASIC METABOLIC PANEL
Anion gap: 12 (ref 5–15)
BUN: 19 mg/dL (ref 6–20)
CO2: 27 mmol/L (ref 22–32)
Calcium: 9.6 mg/dL (ref 8.9–10.3)
Chloride: 100 mmol/L — ABNORMAL LOW (ref 101–111)
Creatinine, Ser: 1.49 mg/dL — ABNORMAL HIGH (ref 0.44–1.00)
GFR, EST AFRICAN AMERICAN: 38 mL/min — AB (ref >60)
GFR, EST NON AFRICAN AMERICAN: 32 mL/min — AB (ref >60)
Glucose, Bld: 103 mg/dL — ABNORMAL HIGH (ref 65–99)
POTASSIUM: 3.9 mmol/L (ref 3.5–5.1)
SODIUM: 139 mmol/L (ref 135–145)

## 2017-04-23 MED ORDER — DILTIAZEM HCL 30 MG PO TABS: 30.0000 mg | ORAL_TABLET | Freq: Two times a day (BID) | ORAL | 6 refills | Status: DC

## 2017-04-23 NOTE — Patient Instructions (Signed)
Your physician recommends that you schedule a follow-up appointment in:  2 months with Dr.Koneswaran     Take Cardizem 30 mg twice a day    Get lab work today BMET        Thank you for [place] !

## 2017-04-24 ENCOUNTER — Telehealth: Payer: Self-pay | Admitting: *Deleted

## 2017-04-24 NOTE — Telephone Encounter (Signed)
-----   Message from Erma Heritage, [place] sent at 04/23/2017  4:49 PM EST ----- Please let the patient know her kidney function has improved and is back to baseline. Potassium levels within normal limits. Continue with current medication regimen. Thank you.

## 2017-04-24 NOTE — Telephone Encounter (Signed)
Called patient with test results. No answer. Left message to call back.  

## 2017-04-26 ENCOUNTER — Ambulatory Visit: Payer: Medicare Other | Admitting: Internal Medicine

## 2017-04-29 DIAGNOSIS — Z1389 Encounter for screening for other disorder: Secondary | ICD-10-CM | POA: Diagnosis not present

## 2017-04-29 DIAGNOSIS — I4891 Unspecified atrial fibrillation: Secondary | ICD-10-CM | POA: Diagnosis not present

## 2017-05-13 ENCOUNTER — Ambulatory Visit (INDEPENDENT_AMBULATORY_CARE_PROVIDER_SITE_OTHER): Payer: Medicare Other | Admitting: *Deleted

## 2017-05-13 DIAGNOSIS — I4891 Unspecified atrial fibrillation: Secondary | ICD-10-CM

## 2017-05-13 DIAGNOSIS — Z7901 Long term (current) use of anticoagulants: Secondary | ICD-10-CM | POA: Diagnosis not present

## 2017-05-13 LAB — POCT INR: INR: 2.6

## 2017-05-13 NOTE — Patient Instructions (Signed)
Continue coumadin 1/2 tablet daily except 1 tablet on Sundays and Wednesdays Recheck 4 weeks

## 2017-05-22 ENCOUNTER — Other Ambulatory Visit: Payer: Self-pay | Admitting: Cardiovascular Disease

## 2017-05-27 DIAGNOSIS — Z7901 Long term (current) use of anticoagulants: Secondary | ICD-10-CM | POA: Diagnosis not present

## 2017-05-27 DIAGNOSIS — I517 Cardiomegaly: Secondary | ICD-10-CM | POA: Diagnosis not present

## 2017-05-27 DIAGNOSIS — I481 Persistent atrial fibrillation: Secondary | ICD-10-CM | POA: Diagnosis not present

## 2017-05-28 DIAGNOSIS — I4891 Unspecified atrial fibrillation: Secondary | ICD-10-CM | POA: Diagnosis not present

## 2017-06-05 ENCOUNTER — Encounter: Payer: Self-pay | Admitting: Cardiovascular Disease

## 2017-06-05 ENCOUNTER — Ambulatory Visit (INDEPENDENT_AMBULATORY_CARE_PROVIDER_SITE_OTHER): Payer: Medicare Other | Admitting: Cardiovascular Disease

## 2017-06-05 VITALS — BP 118/62 | HR 94 | Ht 64.0 in | Wt 147.0 lb

## 2017-06-05 DIAGNOSIS — I4819 Other persistent atrial fibrillation: Secondary | ICD-10-CM

## 2017-06-05 DIAGNOSIS — Z79899 Other long term (current) drug therapy: Secondary | ICD-10-CM | POA: Diagnosis not present

## 2017-06-05 DIAGNOSIS — I481 Persistent atrial fibrillation: Secondary | ICD-10-CM | POA: Diagnosis not present

## 2017-06-05 DIAGNOSIS — I10 Essential (primary) hypertension: Secondary | ICD-10-CM | POA: Diagnosis not present

## 2017-06-05 DIAGNOSIS — N183 Chronic kidney disease, stage 3 unspecified: Secondary | ICD-10-CM

## 2017-06-05 DIAGNOSIS — I5030 Unspecified diastolic (congestive) heart failure: Secondary | ICD-10-CM

## 2017-06-05 DIAGNOSIS — Z7901 Long term (current) use of anticoagulants: Secondary | ICD-10-CM

## 2017-06-05 MED ORDER — VERAPAMIL HCL ER 120 MG PO TBCR: 120.0000 mg | EXTENDED_RELEASE_TABLET | Freq: Two times a day (BID) | ORAL | 3 refills | Status: DC

## 2017-06-05 NOTE — Progress Notes (Signed)
SUBJECTIVE: The patient presents for routine follow-up.  She was hospitalized in early January for rapid atrial fibrillation and acute on chronic diastolic heart failure.  She previously underwent multiple unsuccessful attempts at direct current cardioversion in November 2018 and has been intolerant of amiodarone in the past.   She has severe left atrial dilatation.  She is known for her medication noncompliance.  She reportedly did not tolerate short acting diltiazem and stopped it on her own.  She has been previously intolerant of long-acting diltiazem.  She tells me that the metoprolol keeps her up at night and she would like to try something else.  She also has some dysphagia for solids.  She has not seen a gastroenterologist in several years.    Review of Systems: As per "subjective", otherwise negative.  Allergies  Allergen Reactions  . Cardizem Cd [Diltiazem Hcl Er Beads] Diarrhea  . Amiodarone Nausea And Vomiting  . Sulfa Antibiotics Nausea And Vomiting  . Carvedilol Rash  . Chocolate Rash    Dark chocolate    Current Outpatient Medications  Medication Sig Dispense Refill  . acetaminophen (TYLENOL) 650 MG CR tablet Take 650 mg daily as needed by mouth for pain.     Marland Kitchen albuterol (PROVENTIL HFA;VENTOLIN HFA) 108 (90 BASE) MCG/ACT inhaler Inhale 2 puffs every 6 (six) hours as needed into the lungs for wheezing or shortness of breath.     . ALPRAZolam (XANAX) 0.5 MG tablet Take 0.5 mg at bedtime by mouth.     Marland Kitchen BREO ELLIPTA 100-25 MCG/INH AEPB Inhale 1 puff into the lungs daily as needed.     . fluticasone (FLONASE) 50 MCG/ACT nasal spray Place 2 sprays daily into both nostrils.    . furosemide (LASIX) 40 MG tablet Take 1 tablet (40 mg total) by mouth daily. 30 tablet 6  . metoprolol tartrate (LOPRESSOR) 50 MG tablet Take 0.5 tablets (25 mg total) by mouth 2 (two) times daily. 90 tablet 6  . omeprazole (PRILOSEC) 20 MG capsule Take 20 mg daily as needed by mouth  (indigestion).     . potassium chloride SA (K-DUR,KLOR-CON) 20 MEQ tablet Take 1 tablet (20 mEq total) by mouth once. (Patient taking differently: Take 20 mEq daily by mouth. ) 90 tablet 3  . warfarin (COUMADIN) 5 MG tablet Take 1/2 tablet daily except 1 tablet on Sundays and Wednesdays 30 tablet 3   No current facility-administered medications for this visit.     Past Medical History:  Diagnosis Date  . Anxiety   . Arthritis   . Asthma   . Atrial fibrillation ([place])    a. s/p unsuccessful DCCV in 01/2017 and intolerant to Amiodarone --> Rate-control strategy pursued since  . Cancer [facility]) 2001   right breast  . Dyspnea   . GERD (gastroesophageal reflux disease)   . Hypertension     Past Surgical History:  Procedure Laterality Date  . CARDIOVERSION N/A 02/18/2017   Procedure: CARDIOVERSION;  Surgeon: Josue Hector, MD;  Location[name] ENDOSCOPY;  Service: Cardiovascular;  Laterality: N/A;  . CATARACT EXTRACTION W/PHACO  07/23/2011   Procedure: CATARACT EXTRACTION PHACO AND INTRAOCULAR LENS PLACEMENT ([place]);  Surgeon: Williams Che, MD;  Location: [facility] ORS;  Service: Ophthalmology;  Laterality: Right;  CDE:9.96  . CHOLECYSTECTOMY N/A 02/14/2015   Procedure: LAPAROSCOPIC CHOLECYSTECTOMY;  Surgeon: Aviva Signs, MD;  Location: [facility] ORS;  Service: General;  Laterality: N/A;  . MASTECTOMY     bilateral mastectomy-right breast cancer-left taken by choice  .  SEPTOPLASTY  1995    Social History   Socioeconomic History  . Marital status: Married    Spouse name: Not on file  . Number of children: Not on file  . Years of education: Not on file  . Highest education level: Not on file  Social Needs  . Financial resource strain: Not on file  . Food insecurity - worry: Not on file  . Food insecurity - inability: Not on file  . Transportation needs - medical: Not on file  . Transportation needs - non-medical: Not on file  Occupational History  . Not on file  Tobacco Use  . Smoking  status: Former Smoker    Packs/day: 1.00    Years: 28.00    Pack years: 28.00    Types: Cigarettes    Last attempt to quit: 07/17/1983    Years since quitting: 33.9  . Smokeless tobacco: Never Used  Substance and Sexual Activity  . Alcohol use: No  . Drug use: No  . Sexual activity: No    Birth control/protection: Post-menopausal  Other Topics Concern  . Not on file  Social History Narrative  . Not on file     Vitals:   06/05/17 0854  BP: 118/62  Pulse: 94  SpO2: 98%  Weight: 147 lb (66.7 kg)  Height: 5\' 4"  (1.626 m)    Wt Readings from Last 3 Encounters:  06/05/17 147 lb (66.7 kg)  04/23/17 152 lb (68.9 kg)  03/30/17 145 lb (65.8 kg)     PHYSICAL EXAM General: NAD HEENT: Normal. Neck: No JVD, no thyromegaly. Lungs: Clear to auscultation bilaterally with normal respiratory effort. CV: Heart rate at upper normal limits, irregular rhythm, normal S1/S2, no S3, no murmur. No pretibial or periankle edema.   Abdomen: Soft, nontender, no distention.  Neurologic: Alert and oriented.  Psych: Normal affect. Skin: Normal. Musculoskeletal: No gross deformities.    ECG: Most recent ECG reviewed.   Labs: Lab Results  Component Value Date/Time   K 3.9 04/23/2017 01:24 PM   BUN 19 04/23/2017 01:24 PM   BUN 16 10/24/2016 08:06 AM   CREATININE 1.49 (H) 04/23/2017 01:24 PM   ALT 16 01/22/2017 01:08 PM   TSH 1.811 09/26/2016 05:58 AM   HGB 11.6 (L) 03/30/2017 04:47 AM     Lipids: Lab Results  Component Value Date/Time   LDLCALC 62 09/24/2016 04:38 AM   CHOL 109 09/24/2016 04:38 AM   TRIG 66 09/24/2016 04:38 AM   HDL 34 (L) 09/24/2016 04:38 AM       ASSESSMENT AND PLAN: 1.  Persistent atrial fibrillation: Currently on metoprolol 25 mg twice daily and appears to be intolerant of this as well.  Intolerant of long-acting diltiazem, short acting diltiazem, and higher doses of metoprolol.  Anticoagulated with warfarin. She said she has tried verapamil in the past and  tolerated it.  I will start short acting verapamil 120 mg twice daily.  2.  Heart failure with preserved ejection fraction: Euvolemic.  Continue Lasix 40 milligrams daily.  3.  Chronic hypertension: Blood pressure is normal.  I will monitor given switch from metoprolol tartrate to short acting verapamil.  4.  Chronic kidney disease stage III: Stable with BUN 19 and creatinine 1.49 on 04/23/17.    Disposition: Follow up 6 months   Kate Sable, M.D., F.A.C.C.

## 2017-06-05 NOTE — Patient Instructions (Signed)
Medication Instructions:  Stop metoprolol   Start verapamil 120 mg - two times daily  Labwork: none  Testing/Procedures: none  Follow-Up: Your physician wants you to follow-up in: 6 months .  You will receive a reminder letter in the mail two months in advance. If you don't receive a letter, please call our office to schedule the follow-up appointment.   Any Other Special Instructions Will Be Listed Below (If Applicable).     If you need a refill on your cardiac medications before your next appointment, please call your pharmacy.

## 2017-06-10 ENCOUNTER — Ambulatory Visit (INDEPENDENT_AMBULATORY_CARE_PROVIDER_SITE_OTHER): Payer: Medicare Other | Admitting: *Deleted

## 2017-06-10 DIAGNOSIS — I4891 Unspecified atrial fibrillation: Secondary | ICD-10-CM | POA: Diagnosis not present

## 2017-06-10 DIAGNOSIS — Z7901 Long term (current) use of anticoagulants: Secondary | ICD-10-CM | POA: Diagnosis not present

## 2017-06-10 LAB — POCT INR: INR: 3.8

## 2017-06-10 NOTE — Patient Instructions (Signed)
Hold coumadin tonight then resume 1/2 tablet daily except 1 tablet on Sundays and Wednesdays Recheck 3 weeks

## 2017-06-21 ENCOUNTER — Telehealth: Payer: Self-pay | Admitting: Cardiovascular Disease

## 2017-06-21 MED ORDER — METOPROLOL TARTRATE 50 MG PO TABS: 25.0000 mg | ORAL_TABLET | Freq: Two times a day (BID) | ORAL | 3 refills | Status: DC

## 2017-06-21 NOTE — Telephone Encounter (Signed)
That is fine 

## 2017-06-21 NOTE — Telephone Encounter (Signed)
Do you agree with patient changing back to lopressor ?

## 2017-06-21 NOTE — Telephone Encounter (Signed)
Patient called stating that she stopped taking the Verapamil 120 mg and went back on Lopressor. States that she is feeling fine now.

## 2017-06-22 DIAGNOSIS — H10022 Other mucopurulent conjunctivitis, left eye: Secondary | ICD-10-CM | POA: Diagnosis not present

## 2017-07-01 ENCOUNTER — Ambulatory Visit (INDEPENDENT_AMBULATORY_CARE_PROVIDER_SITE_OTHER): Payer: Medicare Other | Admitting: *Deleted

## 2017-07-01 DIAGNOSIS — I4891 Unspecified atrial fibrillation: Secondary | ICD-10-CM

## 2017-07-01 DIAGNOSIS — Z7901 Long term (current) use of anticoagulants: Secondary | ICD-10-CM

## 2017-07-01 LAB — POCT INR: INR: 3.1

## 2017-07-01 NOTE — Patient Instructions (Signed)
Decrease coumadin 1/2 tablet daily except 1 tablet on Sundays Recheck 3 weeks

## 2017-07-08 DIAGNOSIS — Z6824 Body mass index (BMI) 24.0-24.9, adult: Secondary | ICD-10-CM | POA: Diagnosis not present

## 2017-07-08 DIAGNOSIS — Z1389 Encounter for screening for other disorder: Secondary | ICD-10-CM | POA: Diagnosis not present

## 2017-07-08 DIAGNOSIS — J019 Acute sinusitis, unspecified: Secondary | ICD-10-CM | POA: Diagnosis not present

## 2017-07-08 DIAGNOSIS — R51 Headache: Secondary | ICD-10-CM | POA: Diagnosis not present

## 2017-07-22 ENCOUNTER — Ambulatory Visit (INDEPENDENT_AMBULATORY_CARE_PROVIDER_SITE_OTHER): Payer: Medicare Other | Admitting: *Deleted

## 2017-07-22 DIAGNOSIS — Z7901 Long term (current) use of anticoagulants: Secondary | ICD-10-CM | POA: Diagnosis not present

## 2017-07-22 DIAGNOSIS — I4891 Unspecified atrial fibrillation: Secondary | ICD-10-CM

## 2017-07-22 LAB — POCT INR: INR: 2.5

## 2017-07-22 NOTE — Patient Instructions (Signed)
Continue coumadin 1/2 tablet daily except 1 tablet on Sundays Recheck 4 weeks

## 2017-08-02 ENCOUNTER — Other Ambulatory Visit: Payer: Self-pay | Admitting: *Deleted

## 2017-08-02 MED ORDER — WARFARIN SODIUM 5 MG PO TABS: ORAL_TABLET | ORAL | 3 refills | Status: DC

## 2017-08-02 NOTE — Telephone Encounter (Signed)
Per phone call from pt-- she's ready to have the new Rx for her coumadin sent in the [place]

## 2017-08-02 NOTE — Telephone Encounter (Signed)
RX refill sent as requested.  

## 2017-08-06 ENCOUNTER — Telehealth: Payer: Self-pay | Admitting: *Deleted

## 2017-08-06 MED ORDER — WARFARIN SODIUM 5 MG PO TABS: ORAL_TABLET | ORAL | 3 refills | Status: DC

## 2017-08-06 NOTE — Telephone Encounter (Signed)
Warfarin Rx sent to [facility]

## 2017-08-06 NOTE — Telephone Encounter (Signed)
Patient called requesting to speak with Edrick Oh, RN coumdin in regards to her coumdin   Please call [phone]

## 2017-08-07 ENCOUNTER — Other Ambulatory Visit: Payer: Self-pay | Admitting: *Deleted

## 2017-08-07 ENCOUNTER — Telehealth: Payer: Self-pay | Admitting: *Deleted

## 2017-08-07 NOTE — Telephone Encounter (Signed)
Pt has decided she wants to change coumadin tablet strength from 5mg  tablet to the 2.5mg  tablet so she doesn't have to break them.  Verbal order given to [facility] for coumadin 2.5mg  daily except 5mg  on Sundays.

## 2017-08-21 ENCOUNTER — Ambulatory Visit (INDEPENDENT_AMBULATORY_CARE_PROVIDER_SITE_OTHER): Payer: Medicare Other | Admitting: *Deleted

## 2017-08-21 DIAGNOSIS — I4891 Unspecified atrial fibrillation: Secondary | ICD-10-CM

## 2017-08-21 DIAGNOSIS — Z7901 Long term (current) use of anticoagulants: Secondary | ICD-10-CM

## 2017-08-21 LAB — POCT INR: INR: 2.6 (ref 2.0–3.0)

## 2017-08-21 NOTE — Patient Instructions (Signed)
Changed to warfarin 2.5mg tablet Continue warfarin 1 tablet daily except 2 tablets on Sundays Recheck 4 weeks 

## 2017-08-29 ENCOUNTER — Telehealth: Payer: Self-pay | Admitting: *Deleted

## 2017-08-29 ENCOUNTER — Emergency Department (HOSPITAL_COMMUNITY): Payer: Medicare Other

## 2017-08-29 ENCOUNTER — Encounter (HOSPITAL_COMMUNITY): Payer: Self-pay | Admitting: Emergency Medicine

## 2017-08-29 ENCOUNTER — Emergency Department (HOSPITAL_COMMUNITY)
Admission: EM | Admit: 2017-08-29 | Discharge: 2017-08-29 | Disposition: A | Discharge status: Home / Self Care | Payer: Medicare Other | Attending: Emergency Medicine | Admitting: Emergency Medicine

## 2017-08-29 ENCOUNTER — Other Ambulatory Visit: Payer: Self-pay

## 2017-08-29 DIAGNOSIS — I4891 Unspecified atrial fibrillation: Secondary | ICD-10-CM | POA: Diagnosis not present

## 2017-08-29 DIAGNOSIS — J449 Chronic obstructive pulmonary disease, unspecified: Secondary | ICD-10-CM | POA: Diagnosis not present

## 2017-08-29 DIAGNOSIS — I10 Essential (primary) hypertension: Secondary | ICD-10-CM | POA: Diagnosis not present

## 2017-08-29 DIAGNOSIS — Z79899 Other long term (current) drug therapy: Secondary | ICD-10-CM | POA: Insufficient documentation

## 2017-08-29 DIAGNOSIS — Z853 Personal history of malignant neoplasm of breast: Secondary | ICD-10-CM | POA: Diagnosis not present

## 2017-08-29 DIAGNOSIS — R002 Palpitations: Secondary | ICD-10-CM | POA: Diagnosis not present

## 2017-08-29 DIAGNOSIS — I4821 Permanent atrial fibrillation: Secondary | ICD-10-CM

## 2017-08-29 DIAGNOSIS — Z7901 Long term (current) use of anticoagulants: Secondary | ICD-10-CM | POA: Insufficient documentation

## 2017-08-29 DIAGNOSIS — I482 Chronic atrial fibrillation: Secondary | ICD-10-CM | POA: Diagnosis not present

## 2017-08-29 LAB — CBC WITH DIFFERENTIAL/PLATELET
BASOS PCT: 1 %
Basophils Absolute: 0 10*3/uL (ref 0.0–0.1)
Eosinophils Absolute: 0.2 10*3/uL (ref 0.0–0.7)
Eosinophils Relative: 3 %
HEMATOCRIT: 36 % (ref 36.0–46.0)
HEMOGLOBIN: 11.4 g/dL — AB (ref 12.0–15.0)
LYMPHS ABS: 1.5 10*3/uL (ref 0.7–4.0)
Lymphocytes Relative: 21 %
MCH: 30.2 pg (ref 26.0–34.0)
MCHC: 31.7 g/dL (ref 30.0–36.0)
MCV: 95.2 fL (ref 78.0–100.0)
MONOS PCT: 7 %
Monocytes Absolute: 0.5 10*3/uL (ref 0.1–1.0)
NEUTROS ABS: 4.9 10*3/uL (ref 1.7–7.7)
NEUTROS PCT: 68 %
Platelets: 203 10*3/uL (ref 150–400)
RBC: 3.78 MIL/uL — AB (ref 3.87–5.11)
RDW: 15.7 % — ABNORMAL HIGH (ref 11.5–15.5)
WBC: 7.1 10*3/uL (ref 4.0–10.5)

## 2017-08-29 LAB — BASIC METABOLIC PANEL
ANION GAP: 8 (ref 5–15)
BUN: 16 mg/dL (ref 6–20)
CHLORIDE: 103 mmol/L (ref 101–111)
CO2: 27 mmol/L (ref 22–32)
Calcium: 9.5 mg/dL (ref 8.9–10.3)
Creatinine, Ser: 1.36 mg/dL — ABNORMAL HIGH (ref 0.44–1.00)
GFR calc non Af Amer: 36 mL/min — ABNORMAL LOW (ref >60)
GFR, EST AFRICAN AMERICAN: 42 mL/min — AB (ref >60)
Glucose, Bld: 122 mg/dL — ABNORMAL HIGH (ref 65–99)
Potassium: 3.6 mmol/L (ref 3.5–5.1)
SODIUM: 138 mmol/L (ref 135–145)

## 2017-08-29 LAB — PROTIME-INR
INR: 2.14
PROTHROMBIN TIME: 23.8 s — AB (ref 11.4–15.2)

## 2017-08-29 LAB — MAGNESIUM: MAGNESIUM: 2 mg/dL (ref 1.7–2.4)

## 2017-08-29 LAB — TROPONIN I

## 2017-08-29 NOTE — Discharge Instructions (Addendum)
Increase your metoprolol to 25 mg by mouth twice a day. Take your other medications as previously directed. Keep yourself hydrated. Avoid avoid caffinated products, such as teas, colas, coffee, chocolate. Avoid over the counter cold medicines, herbal or "natural vitamin" products, and illicit drugs because they can contain stimulants.  Call your regular Cardiologist today to schedule a follow up appointment within the next week.  Return to the Emergency Department immediately if worsening.

## 2017-08-29 NOTE — ED Notes (Signed)
Pt walked to the BR down beside bed 10 pt's o2 remained at 98% but she stated that she felt sob. nad noted walking

## 2017-08-29 NOTE — Telephone Encounter (Signed)
Patient calls and states that " My heart is running away with me". Pt reports increased SOB, weakness and HR of 135 at this times. She requested and appt. Today with Dr. Rayann Heman. Pt encouraged to be seen in the ER.

## 2017-08-29 NOTE — ED Provider Notes (Signed)
[facility] EMERGENCY DEPARTMENT Provider Note   CSN: [id number] Arrival date & time: 08/29/17  4403     History   Chief Complaint Chief Complaint  Patient presents with  . Palpitations    HPI Bailey Bishop is a 77 y.o. female.   Palpitations      Pt was seen at 0950.  Per pt and her family, c/o gradual onset and persistence of constant "fast palpitations" that began this morning approximately 3am. Pt states her HR was "up to 135" at times. Has been associated with SOB and "feeling shaky and trembling."  Pt states she took her meds without change in her symptoms. Pt states her symptoms continue. INR 2.6 last week. Denies CP, no cough, no abd pain, no N/V/D, no back pain, no calf/LE pain or unilateral swelling.    Past Medical History:  Diagnosis Date  . Anxiety   . Arthritis   . Asthma   . Atrial fibrillation ([place])    a. s/p unsuccessful DCCV in 01/2017 and intolerant to Amiodarone --> Rate-control strategy pursued since  . Cancer [facility]) 2001   right breast  . Dyspnea   . GERD (gastroesophageal reflux disease)   . Hypertension     Patient Active Problem List   Diagnosis Date Noted  . Atrial fibrillation ([place]) 03/31/2017  . Persistent atrial fibrillation ([place])   . Long term (current) use of anticoagulants [Z79.01] 11/06/2016  . Anticoagulated 10/03/2016  . COPD (chronic obstructive pulmonary disease) ([place]) 10/03/2016  . Hypokalemia 09/24/2016  . Asthma in adult without complication 47/42/5956  . Atrial fibrillation with RVR ([place]) 09/23/2016  . CKD (chronic kidney disease), stage II 09/23/2016  . Hyperglycemia 09/23/2016  . Anxiety   . GERD (gastroesophageal reflux disease)   . Shortness of breath   . Essential hypertension   . Status asthmaticus 07/07/2014    Past Surgical History:  Procedure Laterality Date  . CARDIOVERSION N/A 02/18/2017   Procedure: CARDIOVERSION;  Surgeon: Josue Hector, MD;  Location: [facility] ENDOSCOPY;  Service: Cardiovascular;   Laterality: N/A;  . CATARACT EXTRACTION W/PHACO  07/23/2011   Procedure: CATARACT EXTRACTION PHACO AND INTRAOCULAR LENS PLACEMENT ([place]);  Surgeon: Williams Che, MD;  Location: [facility] ORS;  Service: Ophthalmology;  Laterality: Right;  CDE:9.96  . CHOLECYSTECTOMY N/A 02/14/2015   Procedure: LAPAROSCOPIC CHOLECYSTECTOMY;  Surgeon: Aviva Signs, MD;  Location: [facility] ORS;  Service: General;  Laterality: N/A;  . MASTECTOMY     bilateral mastectomy-right breast cancer-left taken by choice  . SEPTOPLASTY  1995     OB History   None      Home Medications    Prior to Admission medications   Medication Sig Start Date End Date Taking? Authorizing Provider  albuterol (PROVENTIL HFA;VENTOLIN HFA) 108 (90 BASE) MCG/ACT inhaler Inhale 2 puffs every 6 (six) hours as needed into the lungs for wheezing or shortness of breath.     [provider]  ALPRAZolam Duanne Moron) 0.5 MG tablet Take 0.5 mg at bedtime by mouth.  06/18/14   [provider]  BREO ELLIPTA 100-25 MCG/INH AEPB Inhale 1 puff into the lungs daily as needed.  01/31/15   [provider]  fluticasone (FLONASE) 50 MCG/ACT nasal spray Place 2 sprays daily into both nostrils. 01/29/17   [provider]  furosemide (LASIX) 40 MG tablet Take 1 tablet (40 mg total) by mouth daily. 04/04/17   Dhungel, Flonnie Overman, MD  metoprolol tartrate (LOPRESSOR) 50 MG tablet Take 0.5 tablets (25 mg total) by mouth 2 (  two) times daily. 06/21/17 09/19/17  Herminio Commons, MD  omeprazole (PRILOSEC) 20 MG capsule Take 20 mg daily as needed by mouth (indigestion).  02/14/16   [provider]  potassium chloride SA (K-DUR,KLOR-CON) 20 MEQ tablet Take 1 tablet (20 mEq total) by mouth once. Patient taking differently: Take 20 mEq daily by mouth.  10/25/16 01/22/18  Lendon Colonel, NP  warfarin (COUMADIN) 2.5 MG tablet Take 2.5 mg by mouth as directed. Take 1 tablet daily except 2 tablets on Sundays    [provider]    Family  History Family History  Problem Relation Age of Onset  . Cancer Mother   . Aneurysm Father   . Cancer Sister   . Cancer Sister   . Anesthesia problems Neg Hx   . Hypotension Neg Hx   . Malignant hyperthermia Neg Hx   . Pseudochol deficiency Neg Hx     Social History Social History   Tobacco Use  . Smoking status: Former Smoker    Packs/day: 1.00    Years: 28.00    Pack years: 28.00    Types: Cigarettes    Last attempt to quit: 07/17/1983    Years since quitting: 34.1  . Smokeless tobacco: Never Used  Substance Use Topics  . Alcohol use: No  . Drug use: No     Allergies   Cardizem cd [diltiazem hcl er beads]; Amiodarone; Metoprolol tartrate; Sulfa antibiotics; Carvedilol; and Chocolate   Review of Systems Review of Systems  Cardiovascular: Positive for palpitations.  ROS: Statement: All systems negative except as marked or noted in the HPI; Constitutional: Negative for fever and chills. +"shaky, trembling."; ; Eyes: Negative for eye pain, redness and discharge. ; ; ENMT: Negative for ear pain, hoarseness, nasal congestion, sinus pressure and sore throat. ; ; Cardiovascular: +palpitations, SOB. Negative for chest pain, diaphoresis, and peripheral edema. ; ; Respiratory: Negative for cough, wheezing and stridor. ; ; Gastrointestinal: Negative for nausea, vomiting, diarrhea, abdominal pain, blood in stool, hematemesis, jaundice and rectal bleeding. . ; ; Genitourinary: Negative for dysuria, flank pain and hematuria. ; ; Musculoskeletal: Negative for back pain and neck pain. Negative for swelling and trauma.; ; Skin: Negative for pruritus, rash, abrasions, blisters, bruising and skin lesion.; ; Neuro: Negative for headache, lightheadedness and neck stiffness. Negative for weakness, altered level of consciousness, altered mental status, extremity weakness, paresthesias, involuntary movement, seizure and syncope.       Physical Exam Updated Vital Signs BP (!) 143/93   Pulse (!)  120   Temp 97.8 F (36.6 C) (Oral)   Resp (!) 22   SpO2 94%    Patient Vitals for the past 24 hrs:  BP Temp Temp src Pulse Resp SpO2  08/29/17 1032 (!) 123/96 - - 96 18 96 %  08/29/17 1000 (!) 123/96 - - (!) 110 17 92 %  08/29/17 0935 (!) 143/93 97.8 F (36.6 C) Oral (!) 105 20 96 %     Physical Exam 0955: Physical examination:  Nursing notes reviewed; Vital signs and O2 SAT reviewed;  Constitutional: Well developed, Well nourished, Well hydrated, In no acute distress; Head:  Normocephalic, atraumatic; Eyes: EOMI, PERRL, No scleral icterus; ENMT: Mouth and pharynx normal, Mucous membranes moist; Neck: Supple, Full range of motion, No lymphadenopathy; Cardiovascular: Irregular rate and rhythm, No gallop. HR 90's during my exam.; Respiratory: Breath sounds clear & equal bilaterally, No wheezes.  Speaking full sentences with ease, Normal respiratory effort/excursion; Chest: Nontender, Movement normal; Abdomen: Soft, Nontender,  Nondistended, Normal bowel sounds; Genitourinary: No CVA tenderness; Extremities: Peripheral pulses normal, No tenderness, No edema, No calf edema or asymmetry.; Neuro: AA&Ox3, Major CN grossly intact.  Speech clear. No gross focal motor or sensory deficits in extremities.; Skin: Color normal, Warm, Dry.   ED Treatments / Results  Labs (all labs ordered are listed, but only abnormal results are displayed)   EKG EKG Interpretation  Date/Time:  Thursday August 29 2017 09:34:55 EDT Ventricular Rate:  114 PR Interval:    QRS Duration: 90 QT Interval:  360 QTC Calculation: 507 R Axis:   -27 Text Interpretation:  Atrial fibrillation Low voltage, extremity leads Probable anteroseptal infarct, old Prolonged QT interval Baseline wander When compared with ECG of 02/18/2017 No significant change was found Confirmed by Francine Graven [phone]) on 08/29/2017 10:03:02 AM   Radiology   Procedures Procedures (including critical care time)  Medications Ordered in  ED Medications - No data to display   Initial Impression / Assessment and Plan / ED Course  I have reviewed the triage vital signs and the nursing notes.  Pertinent labs & imaging results that were available during my care of the patient were reviewed by me and considered in my medical decision making (see chart for details).  MDM Reviewed: previous chart, nursing note and vitals Reviewed previous: labs and ECG Interpretation: labs, ECG and x-ray   Results for orders placed or performed during the hospital encounter of 37/85/88  Basic metabolic panel  Result Value Ref Range   Sodium 138 135 - 145 mmol/L   Potassium 3.6 3.5 - 5.1 mmol/L   Chloride 103 101 - 111 mmol/L   CO2 27 22 - 32 mmol/L   Glucose, Bld 122 (H) 65 - 99 mg/dL   BUN 16 6 - 20 mg/dL   Creatinine, Ser 1.36 (H) 0.44 - 1.00 mg/dL   Calcium 9.5 8.9 - 10.3 mg/dL   GFR calc non Af Amer 36 (L) >60 mL/min   GFR calc Af Amer 42 (L) >60 mL/min   Anion gap 8 5 - 15  CBC with Differential  Result Value Ref Range   WBC 7.1 4.0 - 10.5 K/uL   RBC 3.78 (L) 3.87 - 5.11 MIL/uL   Hemoglobin 11.4 (L) 12.0 - 15.0 g/dL   HCT 36.0 36.0 - 46.0 %   MCV 95.2 78.0 - 100.0 fL   MCH 30.2 26.0 - 34.0 pg   MCHC 31.7 30.0 - 36.0 g/dL   RDW 15.7 (H) 11.5 - 15.5 %   Platelets 203 150 - 400 K/uL   Neutrophils Relative % 68 %   Neutro Abs 4.9 1.7 - 7.7 K/uL   Lymphocytes Relative 21 %   Lymphs Abs 1.5 0.7 - 4.0 K/uL   Monocytes Relative 7 %   Monocytes Absolute 0.5 0.1 - 1.0 K/uL   Eosinophils Relative 3 %   Eosinophils Absolute 0.2 0.0 - 0.7 K/uL   Basophils Relative 1 %   Basophils Absolute 0.0 0.0 - 0.1 K/uL  Troponin I  Result Value Ref Range   Troponin I <0.03 <0.03 ng/mL  Protime-INR  Result Value Ref Range   Prothrombin Time 23.8 (H) 11.4 - 15.2 seconds   INR 2.14   Magnesium  Result Value Ref Range   Magnesium 2.0 1.7 - 2.4 mg/dL   Dg Chest 2 View Result Date: 08/29/2017 CLINICAL DATA:  Atrial fibrillation. EXAM:  CHEST - 2 VIEW COMPARISON:  March 29, 2017 FINDINGS: Cardiomegaly. No edema. Stable mild scarring and atelectasis  in the left lung. No other abnormalities. IMPRESSION: No interval change.  Cardiomegaly.  No acute abnormality.  No edema. Electronically Signed   By: Dorise Bullion III M.D   On: 08/29/2017 10:18    1100:  Pt ambulated with O2 Sats remaining 98% R/A, HR variable from 80's to 120's. BP stable.  No clear indication for admission at this time. Pt states she is taking metoprolol 25mg  once daily. T/C returned from [facility] Dr. Domenic Polite, case discussed, including:  HPI, pertinent PM/SHx, VS/PE, dx testing, ED course and treatment:  Agrees regarding no clear indication for admission at this time, recommends increase metoprolol 25mg  to BID and f/u office. Dx and testing, as well as d/w Cards MD, d/w pt and family.  Questions answered.  Verb understanding, agreeable to d/c home with outpt f/u.      Final Clinical Impressions(s) / ED Diagnoses   Final diagnoses:  None    ED Discharge Orders    None       Francine Graven, DO 09/02/17 2250

## 2017-08-29 NOTE — ED Triage Notes (Signed)
Heart racing woke pt up around 3am.      Pt c/o sob and dizziness. A/o. Sob noted.

## 2017-08-30 ENCOUNTER — Telehealth: Payer: Self-pay | Admitting: Cardiovascular Disease

## 2017-08-30 NOTE — Telephone Encounter (Signed)
Patient is requesting RX for "nausea medication". Advised to call PCP. States that we have given it to her in the past. / tg

## 2017-08-30 NOTE — Telephone Encounter (Signed)
I searched her med history, Cardiology has never given her medication for nausea.I encouraged her to call he pcp, she states she will.

## 2017-09-18 ENCOUNTER — Ambulatory Visit (INDEPENDENT_AMBULATORY_CARE_PROVIDER_SITE_OTHER): Payer: Medicare Other | Admitting: *Deleted

## 2017-09-18 DIAGNOSIS — I4891 Unspecified atrial fibrillation: Secondary | ICD-10-CM

## 2017-09-18 DIAGNOSIS — Z7901 Long term (current) use of anticoagulants: Secondary | ICD-10-CM | POA: Diagnosis not present

## 2017-09-18 LAB — POCT INR: INR: 2.8 (ref 2.0–3.0)

## 2017-09-18 NOTE — Patient Instructions (Signed)
Changed to warfarin 2.5mg  tablet Continue warfarin 1 tablet daily except 2 tablets on Sundays Recheck 4 weeks

## 2017-09-20 ENCOUNTER — Ambulatory Visit: Payer: Medicare Other | Admitting: Student

## 2017-09-20 ENCOUNTER — Encounter: Payer: Self-pay | Admitting: Student

## 2017-09-20 VITALS — BP 122/64 | HR 100 | Ht 64.0 in | Wt 146.0 lb

## 2017-09-20 DIAGNOSIS — I5032 Chronic diastolic (congestive) heart failure: Secondary | ICD-10-CM | POA: Diagnosis not present

## 2017-09-20 DIAGNOSIS — I10 Essential (primary) hypertension: Secondary | ICD-10-CM | POA: Diagnosis not present

## 2017-09-20 DIAGNOSIS — N183 Chronic kidney disease, stage 3 unspecified: Secondary | ICD-10-CM

## 2017-09-20 DIAGNOSIS — I4819 Other persistent atrial fibrillation: Secondary | ICD-10-CM

## 2017-09-20 DIAGNOSIS — I481 Persistent atrial fibrillation: Secondary | ICD-10-CM

## 2017-09-20 DIAGNOSIS — Z7901 Long term (current) use of anticoagulants: Secondary | ICD-10-CM | POA: Diagnosis not present

## 2017-09-20 MED ORDER — VERAPAMIL HCL ER 120 MG PO TBCR: 120.0000 mg | EXTENDED_RELEASE_TABLET | Freq: Every day | ORAL | 3 refills | Status: DC

## 2017-09-20 NOTE — Progress Notes (Signed)
Cardiology Office Note    Date:  09/20/2017   ID:  Bailey Bishop, DOB 1940/12/11, MRN [id number]  PCP:  Sharilyn Sites, MD  Cardiologist: Kate Sable, MD    Chief Complaint  Patient presents with  . Follow-up    Recent Emergency Dept visit    History of Present Illness:    Bailey Bishop is a 77 y.o. female with past medical history of persistent atrial fibrillation(on Coumadin for anticoagulation, s/p unsuccessful DCCV in 01/2017 and intolerant to Amiodarone), HTN, chronic diastolic CHF, Stage 3 CKD, and GERD who presents to the office today for Emergency Department follow-up.  She was last examined by [name] in 05/2017 and reported having self-discontinued short-acting Cardizem and said that Lopressor was contributing to her insomnia and requested a different medication. Given her known intolerances to Cardizem CD, she was switched to short-acting Verapamil 120mg  BID. The patient called the office two weeks later saying she was intolerant to Verapamil and had switched back to Lopressor.   She called the office on 08/29/2017 and reported episodes of dyspnea and elevated HR, therefore it was advised she proceed to the ED for further evaluation. An EKG was obtained and showed atrial fibrillation with HR at 114. Labs showed WBC 7.1, Hgb 11.4, platelets 203, Na+ 138, K+ 3.6, and creatinine 1.36. Initial troponin was negative and Mg was at 2.0. CXR showed cardiomegaly with no acute findings. She had only been taking Lopressor once daily and it was recommended she titrate this to 25mg  BID.   In talking with the patient and her husband today, she reports having palpitations on a daily basis over the past month. She was started on Verapamil at the time of her last office visit but only took this for 2 doses due to it causing nausea. She reports that she has nausea on a daily basis and is unsure if this was worse with the medication addition. Says that her heart rate has been in  the 90's to 110's when checked at home. Reports associated dyspnea and fatigue. Denies any recent orthopnea, PND, lower extremity edema, lightheadedness, dizziness, or presyncope.  She remains on Coumadin for anticoagulation and denies having any evidence of active bleeding.  Does experience frequent bruising.   Past Medical History:  Diagnosis Date  . Anxiety   . Arthritis   . Asthma   . Atrial fibrillation ([place])    a. s/p unsuccessful DCCV in 01/2017 and intolerant to Amiodarone --> Rate-control strategy pursued since  . Cancer [facility]) 2001   right breast  . Dyspnea   . GERD (gastroesophageal reflux disease)   . Hypertension     Past Surgical History:  Procedure Laterality Date  . CARDIOVERSION N/A 02/18/2017   Procedure: CARDIOVERSION;  Surgeon: Josue Hector, MD;  Location: [facility] ENDOSCOPY;  Service: Cardiovascular;  Laterality: N/A;  . CATARACT EXTRACTION W/PHACO  07/23/2011   Procedure: CATARACT EXTRACTION PHACO AND INTRAOCULAR LENS PLACEMENT ([place]);  Surgeon: Williams Che, MD;  Location: [facility] ORS;  Service: Ophthalmology;  Laterality: Right;  CDE:9.96  . CHOLECYSTECTOMY N/A 02/14/2015   Procedure: LAPAROSCOPIC CHOLECYSTECTOMY;  Surgeon: Aviva Signs, MD;  Location: [facility] ORS;  Service: General;  Laterality: N/A;  . MASTECTOMY     bilateral mastectomy-right breast cancer-left taken by choice  . SEPTOPLASTY  1995    Current Medications: Outpatient Medications Prior to Visit  Medication Sig Dispense Refill  . ALPRAZolam (XANAX) 0.5 MG tablet Take 0.5 mg at bedtime by mouth.     Marland Kitchen  BREO ELLIPTA 100-25 MCG/INH AEPB Inhale 1 puff into the lungs daily as needed.     . fluticasone (FLONASE) 50 MCG/ACT nasal spray Place 2 sprays daily into both nostrils.    . furosemide (LASIX) 40 MG tablet Take 1 tablet (40 mg total) by mouth daily. 30 tablet 6  . metoprolol tartrate (LOPRESSOR) 50 MG tablet Take 0.5 tablets (25 mg total) by mouth 2 (two) times daily. 90 tablet 3  . omeprazole  (PRILOSEC) 20 MG capsule Take 20 mg daily as needed by mouth (indigestion).     . potassium chloride SA (K-DUR,KLOR-CON) 20 MEQ tablet Take 1 tablet (20 mEq total) by mouth once. (Patient taking differently: Take 20 mEq daily by mouth. ) 90 tablet 3  . warfarin (COUMADIN) 2.5 MG tablet Take 2.5 mg by mouth as directed. Take 1 tablet daily except 2 tablets on Sundays    . albuterol (PROVENTIL HFA;VENTOLIN HFA) 108 (90 BASE) MCG/ACT inhaler Inhale 2 puffs every 6 (six) hours as needed into the lungs for wheezing or shortness of breath.      No facility-administered medications prior to visit.      Allergies:   Cardizem cd [diltiazem hcl er beads]; Amiodarone; Metoprolol tartrate; Sulfa antibiotics; Carvedilol; and Chocolate   Social History   Socioeconomic History  . Marital status: Married    Spouse name: Not on file  . Number of children: Not on file  . Years of education: Not on file  . Highest education level: Not on file  Occupational History  . Not on file  Social Needs  . Financial resource strain: Not on file  . Food insecurity:    Worry: Not on file    Inability: Not on file  . Transportation needs:    Medical: Not on file    Non-medical: Not on file  Tobacco Use  . Smoking status: Former Smoker    Packs/day: 1.00    Years: 28.00    Pack years: 28.00    Types: Cigarettes    Last attempt to quit: 07/17/1983    Years since quitting: 34.2  . Smokeless tobacco: Never Used  Substance and Sexual Activity  . Alcohol use: No  . Drug use: No  . Sexual activity: Never    Birth control/protection: Post-menopausal  Lifestyle  . Physical activity:    Days per week: Not on file    Minutes per session: Not on file  . Stress: Not on file  Relationships  . Social connections:    Talks on phone: Not on file    Gets together: Not on file    Attends religious service: Not on file    Active member of club or organization: Not on file    Attends meetings of clubs or  organizations: Not on file    Relationship status: Not on file  Other Topics Concern  . Not on file  Social History Narrative  . Not on file     Family History:  The patient's family history includes Aneurysm in her father; Cancer in her mother, sister, and sister.   Review of Systems:   Please see the history of present illness.     General:  No chills, fever, night sweats or weight changes. Positive for fatigue.  Cardiovascular:  No chest pain, edema, orthopnea, paroxysmal nocturnal dyspnea. Positive for dyspnea and palpitations.  Dermatological: No rash, lesions/masses Respiratory: No cough, dyspnea Urologic: No hematuria, dysuria Abdominal:   No nausea, vomiting, diarrhea, bright red blood per rectum, melena,  or hematemesis Neurologic:  No visual changes, wkns, changes in mental status. All other systems reviewed and are otherwise negative except as noted above.   Physical Exam:    VS:  BP 122/64   Pulse 100   Ht 5\' 4"  (1.626 m)   Wt 146 lb (66.2 kg)   SpO2 97%   BMI 25.06 kg/m    General: Well developed, well nourished Caucasian female appearing in no acute distress. Head: Normocephalic, atraumatic, sclera non-icteric, no xanthomas, nares are without discharge.  Neck: No carotid bruits. JVD not elevated.  Lungs: Respirations regular and unlabored, without wheezes or rales.  Heart: Irregularly irregular. No S3 or S4.  No murmur, no rubs, or gallops appreciated. Abdomen: Soft, non-tender, non-distended with normoactive bowel sounds. No hepatomegaly. No rebound/guarding. No obvious abdominal masses. Msk:  Strength and tone appear normal for age. No joint deformities or effusions. Extremities: No clubbing or cyanosis. No lower extremity edema.  Distal pedal pulses are 2+ bilaterally. Neuro: Alert and oriented X 3. Moves all extremities spontaneously. No focal deficits noted. Psych:  Responds to questions appropriately with a normal affect. Skin: No rashes or lesions  noted  Wt Readings from Last 3 Encounters:  09/20/17 146 lb (66.2 kg)  06/05/17 147 lb (66.7 kg)  04/23/17 152 lb (68.9 kg)     Studies/Labs Reviewed:   EKG:  EKG is not ordered today.   Recent Labs: 09/26/2016: TSH 1.811 01/22/2017: ALT 16 03/29/2017: B Natriuretic Peptide 963.0 08/29/2017: BUN 16; Creatinine, Ser 1.36; Hemoglobin 11.4; Magnesium 2.0; Platelets 203; Potassium 3.6; Sodium 138   Lipid Panel    Component Value Date/Time   CHOL 109 09/24/2016 0438   TRIG 66 09/24/2016 0438   HDL 34 (L) 09/24/2016 0438   CHOLHDL 3.2 09/24/2016 0438   VLDL 13 09/24/2016 0438   LDLCALC 62 09/24/2016 0438    Additional studies/ records that were reviewed today include:   Echocardiogram: 09/2016 Study Conclusions  - Left ventricle: The cavity size was normal. Wall thickness was   normal. Systolic function was normal. The estimated ejection   fraction was in the range of 55% to 60%. Wall motion was normal;   there were no regional wall motion abnormalities. - Aortic valve: Mildly to moderately calcified annulus. Mildly   thickened leaflets. Valve area (VTI): 2.92 cm^2. Valve area   (Vmax): 2.73 cm^2. Valve area (Vmean): 2.61 cm^2. - Mitral valve: There was moderate regurgitation. - Left atrium: The atrium was severely dilated. - Right atrium: The atrium was severely dilated. - Atrial septum: No defect or patent foramen ovale was identified. - Pulmonary arteries: Systolic pressure was moderately increased.   PA peak pressure: 49 mm Hg (S). - Pericardium, extracardiac: There is a small circumferential   pericardial effusion. - Technically adequate study.  Assessment:    1. Persistent atrial fibrillation ([place])   2. Long term (current) use of anticoagulants   3. Chronic diastolic CHF (congestive heart failure) ([place])   4. Essential hypertension   5. CKD (chronic kidney disease) stage 3, GFR 30-59 ml/min (HCC)      Plan:   In order of problems listed above:  1. Persistent  Atrial Fibrillation/ Use of Long-Term Anticoagulation - The patient has a history of atrial fibrillation which has been difficult to manage given her intolerance to several medications including short acting Cardizem, long-acting Cardizem CD, Amiodarone, Carvedilol, and higher doses of Metoprolol. - She was recently evaluated in the ED for worsening palpitations and says that her heart rate  has been in the 90's to low 110's when checked at home. Reports associated dyspnea and fatigue. - It had been recommended at her last office visit that she start Verapamil but she only took this for 2 doses due to worsening nausea. She has nausea on a daily basis and now wishes to try the medication again as she does not think that she gave the medication enough time to work. She says that it did cause some fatigue, therefore I recommended that she continue on Lopressor 25mg  BID (has been intolerant to higher dosing) and add back extended-release Verapamil 120mg  once daily and take this in the evening hours. Her husband asks several questions about repeat DCCV but this has been avoided given her severely dilated LA and intolerance to antiarrhythmic medications and this was reviewed with them today.  - denies any evidence of active bleeding. Remains on Coumadin for anticoagulation.   2. Chronic Diastolic CHF - She has chronic dyspnea on exertion but denies any recent orthopnea, PND, or lower extremity edema.  Recent CXR on 08/29/2017 showed cardiomegaly with no acute pulmonary findings. - Appears euvolemic by examination today. Will continue on Lasix 40 mg daily.  3. HTN - BP is well controlled at 122/64 during today's visit.  Will continue on Lopressor 25 mg twice daily along with the addition of Verapamil as outlined above.  4. Stage 3 CKD - Baseline creatinine  is 1.4 - 1.6.  Stable at 1.36 by recent labs on 08/29/2017.    Medication Adjustments/Labs and Tests Ordered: Current medicines are reviewed at length  with the patient today.  Concerns regarding medicines are outlined above.  Medication changes, Labs and Tests ordered today are listed in the Patient Instructions below. Patient Instructions  Medication Instructions:  Your physician recommends that you continue on your current medications as directed. Please refer to the Current Medication list given to you today.   Labwork: NONE   Testing/Procedures: NONE   Follow-Up: Your physician recommends that you schedule a follow-up appointment in: 3 Months   Any Other Special Instructions Will Be Listed Below (If Applicable).  Your physician has requested that you regularly monitor and record your blood pressure readings at home. Please use the same machine at the same time of day to check your readings and record them to bring to your follow-up visit.  If you need a refill on your cardiac medications before your next appointment, please call your pharmacy.  Thank you for choosing [facility]!    Signed, Erma Heritage, PA-C  09/20/2017 4:41 PM    [facility] [address] [place], [place] [address] Phone: [phone]

## 2017-09-20 NOTE — Patient Instructions (Signed)
Medication Instructions:  Your physician recommends that you continue on your current medications as directed. Please refer to the Current Medication list given to you today.   Labwork: NONE   Testing/Procedures: NONE   Follow-Up: Your physician recommends that you schedule a follow-up appointment in: 3 Months    Any Other Special Instructions Will Be Listed Below (If Applicable).  Your physician has requested that you regularly monitor and record your blood pressure readings at home. Please use the same machine at the same time of day to check your readings and record them to bring to your follow-up visit.     If you need a refill on your cardiac medications before your next appointment, please call your pharmacy.  Thank you for choosing [facility]!

## 2017-09-25 DIAGNOSIS — J019 Acute sinusitis, unspecified: Secondary | ICD-10-CM | POA: Diagnosis not present

## 2017-10-14 ENCOUNTER — Telehealth: Payer: Self-pay | Admitting: Student

## 2017-10-14 DIAGNOSIS — I4819 Other persistent atrial fibrillation: Secondary | ICD-10-CM

## 2017-10-14 NOTE — Telephone Encounter (Signed)
Pt wants to know if she's supposed to have an apt w/ [name]. States at her last office visit [place] told her to schedule an apt to see him.

## 2017-10-14 NOTE — Telephone Encounter (Signed)
Attempt to reach, [id number] 

## 2017-10-14 NOTE — Telephone Encounter (Signed)
Husband states he wants something done for his wife as she SOB,and feels Dr Lovena Le can help her.I told him I would forward to B.Strader PA-C

## 2017-10-14 NOTE — Telephone Encounter (Signed)
   She reported having dyspnea at the time of her last visit as well and this was in the setting of her elevated HR. HR has been difficult to control in the setting of her multiple medication intolerances. Is she still taking Verapamil 120mg  daily and Lopressor 25mg  BID? If so, can she please keep a HR and BP log as she was previously intolerant to higher doses of Lopressor but we could possibly titrate Verapamil if needed.   I am fine with her seeing [name] for persistent atrial fibrillation or being referred back to the [facility] (was previously evaluated there in 01/2017), whichever they prefer.   Signed, Erma Heritage, PA-C 10/14/2017, 2:39 PM Pager: [phone]

## 2017-10-14 NOTE — Addendum Note (Signed)
Addended by: Barbarann Ehlers A on: 10/14/2017 03:59 PM   Modules accepted: Orders

## 2017-10-14 NOTE — Telephone Encounter (Signed)
Pt stopped verapamil because she had severe diarrhea.Will keep BP and HR log

## 2017-10-16 ENCOUNTER — Ambulatory Visit (INDEPENDENT_AMBULATORY_CARE_PROVIDER_SITE_OTHER): Payer: Medicare Other | Admitting: *Deleted

## 2017-10-16 DIAGNOSIS — I4891 Unspecified atrial fibrillation: Secondary | ICD-10-CM

## 2017-10-16 DIAGNOSIS — Z7901 Long term (current) use of anticoagulants: Secondary | ICD-10-CM

## 2017-10-16 LAB — POCT INR: INR: 2.6 (ref 2.0–3.0)

## 2017-10-16 NOTE — Patient Instructions (Signed)
Changed to warfarin 2.5mg  tablet Continue warfarin 1 tablet daily except 2 tablets on Sundays Recheck 4 weeks

## 2017-10-28 ENCOUNTER — Other Ambulatory Visit: Payer: Self-pay | Admitting: Cardiovascular Disease

## 2017-11-13 ENCOUNTER — Encounter: Payer: Self-pay | Admitting: Internal Medicine

## 2017-11-13 ENCOUNTER — Ambulatory Visit: Payer: Medicare Other | Admitting: Internal Medicine

## 2017-11-13 VITALS — BP 128/72 | HR 108 | Ht 64.0 in | Wt 143.0 lb

## 2017-11-13 DIAGNOSIS — I4819 Other persistent atrial fibrillation: Secondary | ICD-10-CM

## 2017-11-13 DIAGNOSIS — I10 Essential (primary) hypertension: Secondary | ICD-10-CM | POA: Diagnosis not present

## 2017-11-13 DIAGNOSIS — I5032 Chronic diastolic (congestive) heart failure: Secondary | ICD-10-CM | POA: Diagnosis not present

## 2017-11-13 DIAGNOSIS — I481 Persistent atrial fibrillation: Secondary | ICD-10-CM

## 2017-11-13 NOTE — Patient Instructions (Addendum)
Medication Instructions:  Your physician recommends that you continue on your current medications as directed. Please refer to the Current Medication list given to you today.  Labwork: You will get lab work at your Afib clinic visit.  Testing/Procedures: None ordered.  Follow-Up: You will have an appointment November 26, 2017 at 9:00 am with the Afib clinic for dofetilide load.  Any Other Special Instructions Will Be Listed Below (If Applicable).  If you need a refill on your cardiac medications before your next appointment, please call your pharmacy.   Dofetilide capsules What is this medicine? DOFETILIDE (doe FET il ide) is an antiarrhythmic drug. It helps make your heart beat regularly. This medicine also helps to slow rapid heartbeats. This medicine may be used for other purposes; ask your health care provider or pharmacist if you have questions. COMMON BRAND NAME(S): Tikosyn What should I tell my health care provider before I take this medicine? They need to know if you have any of these conditions: -heart disease -history of low levels of potassium or magnesium -kidney disease -liver disease -an unusual or allergic reaction to dofetilide, other medicines, foods, dyes, or preservatives -pregnant or trying to get pregnant -breast-feeding How should I use this medicine? Take this medicine by mouth with a glass of water. Follow the directions on the prescription label. You can take this medicine with or without food. Do not drink grapefruit juice with this medicine. Take your doses at regular intervals. Do not take your medicine more often than directed. Do not stop taking this medicine suddenly. This may cause serious, heart-related side effects. Your doctor will tell you how much medicine to take. If your doctor wants you to stop the medicine, the dose will be slowly lowered over time to avoid any side effects. Talk to your pediatrician regarding the use of this medicine in  children. Special care may be needed. Overdosage: If you think you have taken too much of this medicine contact a poison control center or emergency room at once. NOTE: This medicine is only for you. Do not share this medicine with others. What if I miss a dose? If you miss a dose, take it as soon as you can. If it is almost time for your next dose, take only that dose. Do not take double or extra doses. What may interact with this medicine? Do not take this medicine with any of the following medications: -cimetidine -dolutegravir -hydrochlorothiazide alone or in combination with other medicines -isavuconazonium -ketoconazole -megestrol -other medicines that prolong the QT interval (cause an abnormal heart rhythm) -prochlorperazine -trimethoprim alone or in combination with sulfamethoxazole -verapamil This medicine may also interact with the following medications: -amiloride -certain antidepressants like fluvoxamine or paroxetine -certain antiviral medicines for HIV or AIDS like atazanavir or darunavir -certain medicines for fungal infections like clotrimazole or miconazole -digoxin -diltiazem -dronabinol, THC -grapefruit juice -metformin -nefazodone -triamterene -zafirlukast This list may not describe all possible interactions. Give your health care provider a list of all the medicines, herbs, non-prescription drugs, or dietary supplements you use. Also tell them if you smoke, drink alcohol, or use illegal drugs. Some items may interact with your medicine. What should I watch for while using this medicine? Visit your doctor or health care professional for regular checks on your progress. Wear a medical ID bracelet or chain, and carry a card that describes your disease and details of your medicine and dosage times. Check your heart rate and blood pressure regularly while you are taking this medicine. Ask  your doctor or health care professional what your heart rate and blood pressure  should be, and when you should contact him or her. Your doctor or health care professional also may schedule regular tests to check your progress. You will be started on this medicine in a specialized facility for at least three days. You will be monitored to find the right dose of medicine for you. It is very important that you take your medicine exactly as prescribed when you leave the hospital. The correct dosing of this medicine is very important to treat your condition and prevent possible serious side effects. What side effects may I notice from receiving this medicine? Side effects that you should report to your doctor or health care professional as soon as possible: -allergic reactions like skin rash, itching or hives, swelling of the face, lips, or tongue -breathing problems -dizziness -fast or rapid beating of the heart -feeling faint or lightheaded -swelling of the ankles -unusually weak or tired -vomiting Side effects that usually do not require medical attention (report to your doctor or health care professional if they continue or are bothersome): -cough -diarrhea -difficulty sleeping -headache -nausea -stomach pain This list may not describe all possible side effects. Call your doctor for medical advice about side effects. You may report side effects to FDA at 1-800-FDA-1088. Where should I keep my medicine? Keep out of the reach of children. Store at room temperature between 15 and 30 degrees C (59 and 86 degrees F). Protect the medicine from moisture or humidity. Keep container tightly closed. Throw away any unused medicine after the expiration date. NOTE: This sheet is a summary. It may not cover all possible information. If you have questions about this medicine, talk to your doctor, pharmacist, or health care provider.  2018 Elsevier/Gold Standard (2016-01-23 71:95:97)

## 2017-11-13 NOTE — Progress Notes (Signed)
HPI Bailey Bishop is referred today by Dr. Jacinta Shoe for evaluation of atrial fib. She is a 77 yo woman with COPD and longstanding tobacco abuse who has undergone DCCV in the past. She tried amiodarone but was intolerant. She has tried rate control. She has not had syncope. She initially had very rapid ventricular response in atrial fib. As time has gone on her rates have moderated though she is still sob though she does not have palpitations. Allergies  Allergen Reactions  . Cardizem Cd [Diltiazem Hcl Er Beads] Diarrhea  . Amiodarone Nausea And Vomiting  . Metoprolol Tartrate   . Sulfa Antibiotics Nausea And Vomiting  . Carvedilol Rash  . Chocolate Rash    Dark chocolate     Current Outpatient Medications  Medication Sig Dispense Refill  . ALPRAZolam (XANAX) 0.5 MG tablet Take 0.5 mg at bedtime by mouth.     Marland Kitchen BREO ELLIPTA 100-25 MCG/INH AEPB Inhale 1 puff into the lungs daily as needed.     . fluticasone (FLONASE) 50 MCG/ACT nasal spray Place 2 sprays daily into both nostrils.    . furosemide (LASIX) 40 MG tablet Take 1 tablet (40 mg total) by mouth daily. 30 tablet 6  . metoprolol tartrate (LOPRESSOR) 50 MG tablet Take 25 mg by mouth 2 (two) times daily.    Marland Kitchen omeprazole (PRILOSEC) 20 MG capsule Take 20 mg daily as needed by mouth (indigestion).     . potassium chloride SA (K-DUR,KLOR-CON) 20 MEQ tablet Take 1 tablet (20 mEq total) by mouth once. (Patient taking differently: Take 20 mEq daily by mouth. ) 90 tablet 3  . warfarin (COUMADIN) 2.5 MG tablet TAKE ONE TABLET (2.5MG  TOTAL) ONCE DAILYEXCEPT SUNDAYS TAKE TWO TABLETS (5MG  TOTAL) 96 tablet 0   No current facility-administered medications for this visit.      Past Medical History:  Diagnosis Date  . Anxiety   . Arthritis   . Asthma   . Atrial fibrillation ([place])    a. s/p unsuccessful DCCV in 01/2017 and intolerant to Amiodarone --> Rate-control strategy pursued since  . Cancer [facility]) 2001   right breast  .  Dyspnea   . GERD (gastroesophageal reflux disease)   . Hypertension     ROS:   All systems reviewed and negative except as noted in the HPI.   Past Surgical History:  Procedure Laterality Date  . CARDIOVERSION N/A 02/18/2017   Procedure: CARDIOVERSION;  Surgeon: Josue Hector, MD;  Location: [facility] ENDOSCOPY;  Service: Cardiovascular;  Laterality: N/A;  . CATARACT EXTRACTION W/PHACO  07/23/2011   Procedure: CATARACT EXTRACTION PHACO AND INTRAOCULAR LENS PLACEMENT ([place]);  Surgeon: Williams Che, MD;  Location: [facility] ORS;  Service: Ophthalmology;  Laterality: Right;  CDE:9.96  . CHOLECYSTECTOMY N/A 02/14/2015   Procedure: LAPAROSCOPIC CHOLECYSTECTOMY;  Surgeon: Aviva Signs, MD;  Location: [facility] ORS;  Service: General;  Laterality: N/A;  . MASTECTOMY     bilateral mastectomy-right breast cancer-left taken by choice  . SEPTOPLASTY  1995     Family History  Problem Relation Age of Onset  . Cancer Mother   . Aneurysm Father   . Cancer Sister   . Cancer Sister   . Anesthesia problems Neg Hx   . Hypotension Neg Hx   . Malignant hyperthermia Neg Hx   . Pseudochol deficiency Neg Hx      Social History   Socioeconomic History  . Marital status: Married    Spouse name: Not on file  . Number of  children: Not on file  . Years of education: Not on file  . Highest education level: Not on file  Occupational History  . Not on file  Social Needs  . Financial resource strain: Not on file  . Food insecurity:    Worry: Not on file    Inability: Not on file  . Transportation needs:    Medical: Not on file    Non-medical: Not on file  Tobacco Use  . Smoking status: Former Smoker    Packs/day: 1.00    Years: 28.00    Pack years: 28.00    Types: Cigarettes    Last attempt to quit: 07/17/1983    Years since quitting: 34.3  . Smokeless tobacco: Never Used  Substance and Sexual Activity  . Alcohol use: No  . Drug use: No  . Sexual activity: Never    Birth control/protection:  Post-menopausal  Lifestyle  . Physical activity:    Days per week: Not on file    Minutes per session: Not on file  . Stress: Not on file  Relationships  . Social connections:    Talks on phone: Not on file    Gets together: Not on file    Attends religious service: Not on file    Active member of club or organization: Not on file    Attends meetings of clubs or organizations: Not on file    Relationship status: Not on file  . Intimate partner violence:    Fear of current or ex partner: Not on file    Emotionally abused: Not on file    Physically abused: Not on file    Forced sexual activity: Not on file  Other Topics Concern  . Not on file  Social History Narrative  . Not on file     BP 128/72   Pulse (!) 108   Ht 5\' 4"  (1.626 m)   Wt 143 lb (64.9 kg)   BMI 24.55 kg/m   Physical Exam:  stable appearing elderly woman, NAD HEENT: Unremarkable Neck:  6 cm JVD, no thyromegally Lymphatics:  No adenopathy Back:  No CVA tenderness Lungs:  Clear with no wheezes HEART:  Regular rate rhythm, no murmurs, no rubs, no clicks Abd:  soft, positive bowel sounds, no organomegally, no rebound, no guarding Ext:  2 plus pulses, no edema, no cyanosis, no clubbing Skin:  No rashes no nodules Neuro:  CN II through XII intact, motor grossly intact  EKG - atrial fib with a controlled VR   Assess/Plan: 1. Atrial fib - we discussed the treatment options. I have recommended she be admitted for initiation of dofetilide. When I measure her QTC in the office it is around 430 though somewhat difficult to calculate in the setting of atrial fib. If dofetilide is ineffective, PPM and AV node ablation an option.  2. COPD - she has a long h/o tobacco abuse. She has remained on bronchodilators.   Mikle Bosworth.D.

## 2017-11-14 ENCOUNTER — Telehealth: Payer: Self-pay

## 2017-11-14 NOTE — Telephone Encounter (Addendum)
Medication list reviewed in anticipation of upcoming Tikosyn initiation. Patient is not taking any contraindicated or QTc prolonging medications.   Patient is anticoagulated on warfarin. Weekly INR checks are needed the 4 weeks prior to Tikosyn initiation with INRs in range of 2-3. Patient currently scheduled for Tikosyn load Sept. 3rd which will not allow for 4 INR checks. Will route to [name] to see if TEE will be scheduled or if patient will be getting 4 weekly INRs.   Patient's potassium most recently was 3.6, patient will likely need higher dose of potassium supplementation before Tikosyn start.   Patient will need to be counseled to avoid use of Benadryl while on Tikosyn and in the 2-3 days prior to Tikosyn initiation.   Isaias Sakai, Sherian Rein D PGY1 Pharmacy Resident  Phone [phone] 11/14/2017      11:17 AM

## 2017-11-15 ENCOUNTER — Ambulatory Visit (INDEPENDENT_AMBULATORY_CARE_PROVIDER_SITE_OTHER): Payer: Medicare Other | Admitting: *Deleted

## 2017-11-15 DIAGNOSIS — I4891 Unspecified atrial fibrillation: Secondary | ICD-10-CM | POA: Diagnosis not present

## 2017-11-15 DIAGNOSIS — Z7901 Long term (current) use of anticoagulants: Secondary | ICD-10-CM | POA: Diagnosis not present

## 2017-11-15 LAB — POCT INR: INR: 2.4 (ref 2.0–3.0)

## 2017-11-15 NOTE — Patient Instructions (Signed)
Description   Weekly  INR for Tikosyn admission.  Continue warfarin 1 tablet daily except 2 tablets on Sundays Recheck 1 week

## 2017-11-15 NOTE — Telephone Encounter (Signed)
Spoke with Coumadin in [place].  Nurse will notify Pt she needs weekly INR's x 4 and then afib clinic will schedule her appt to be admitted.  Will be available if Pt has any questions.

## 2017-11-20 ENCOUNTER — Ambulatory Visit (INDEPENDENT_AMBULATORY_CARE_PROVIDER_SITE_OTHER): Payer: Medicare Other | Admitting: *Deleted

## 2017-11-20 DIAGNOSIS — I4891 Unspecified atrial fibrillation: Secondary | ICD-10-CM | POA: Diagnosis not present

## 2017-11-20 DIAGNOSIS — Z7901 Long term (current) use of anticoagulants: Secondary | ICD-10-CM

## 2017-11-20 LAB — POCT INR: INR: 2.5 (ref 2.0–3.0)

## 2017-11-20 NOTE — Patient Instructions (Signed)
Weekly  INR for Tikosyn admission. (2nd today) Continue warfarin 1 tablet daily except 2 tablets on Sundays Recheck 1 week

## 2017-11-26 ENCOUNTER — Ambulatory Visit (HOSPITAL_COMMUNITY): Payer: Medicare Other | Admitting: Nurse Practitioner

## 2017-11-27 ENCOUNTER — Ambulatory Visit (INDEPENDENT_AMBULATORY_CARE_PROVIDER_SITE_OTHER): Payer: Medicare Other | Admitting: *Deleted

## 2017-11-27 DIAGNOSIS — I4891 Unspecified atrial fibrillation: Secondary | ICD-10-CM

## 2017-11-27 DIAGNOSIS — Z7901 Long term (current) use of anticoagulants: Secondary | ICD-10-CM

## 2017-11-27 LAB — POCT INR: INR: 3.2 — AB (ref 2.0–3.0)

## 2017-11-27 NOTE — Patient Instructions (Signed)
Weekly  INR for Tikosyn admission. (2nd today) Take coumadin 1/2 tablet tonight then resume 1 tablet daily except 2 tablets on Sundays Recheck 1 week

## 2017-12-02 ENCOUNTER — Ambulatory Visit (INDEPENDENT_AMBULATORY_CARE_PROVIDER_SITE_OTHER): Payer: Medicare Other | Admitting: *Deleted

## 2017-12-02 ENCOUNTER — Inpatient Hospital Stay (HOSPITAL_COMMUNITY)
Admission: RE | Admit: 2017-12-02 | Discharge: 2017-12-05 | DRG: 310 | Disposition: A | Discharge status: Home / Self Care | Payer: Medicare Other | Source: Ambulatory Visit | Attending: Internal Medicine | Admitting: Internal Medicine

## 2017-12-02 ENCOUNTER — Other Ambulatory Visit: Payer: Self-pay

## 2017-12-02 ENCOUNTER — Encounter (HOSPITAL_COMMUNITY): Payer: Self-pay

## 2017-12-02 ENCOUNTER — Ambulatory Visit (HOSPITAL_COMMUNITY)
Admission: RE | Admit: 2017-12-02 | Discharge: 2017-12-02 | Disposition: A | Discharge status: Home / Self Care | Payer: Medicare Other | Source: Ambulatory Visit | Attending: Nurse Practitioner | Admitting: Nurse Practitioner

## 2017-12-02 ENCOUNTER — Encounter (HOSPITAL_COMMUNITY): Payer: Self-pay | Admitting: Nurse Practitioner

## 2017-12-02 VITALS — BP 136/84 | HR 128 | Ht 64.0 in | Wt 139.0 lb

## 2017-12-02 DIAGNOSIS — Z5181 Encounter for therapeutic drug level monitoring: Secondary | ICD-10-CM

## 2017-12-02 DIAGNOSIS — Z87891 Personal history of nicotine dependence: Secondary | ICD-10-CM

## 2017-12-02 DIAGNOSIS — K219 Gastro-esophageal reflux disease without esophagitis: Secondary | ICD-10-CM | POA: Diagnosis present

## 2017-12-02 DIAGNOSIS — J45909 Unspecified asthma, uncomplicated: Secondary | ICD-10-CM | POA: Diagnosis present

## 2017-12-02 DIAGNOSIS — Z79899 Other long term (current) drug therapy: Secondary | ICD-10-CM

## 2017-12-02 DIAGNOSIS — N183 Chronic kidney disease, stage 3 (moderate): Secondary | ICD-10-CM | POA: Diagnosis present

## 2017-12-02 DIAGNOSIS — Z888 Allergy status to other drugs, medicaments and biological substances status: Secondary | ICD-10-CM | POA: Diagnosis not present

## 2017-12-02 DIAGNOSIS — Z9011 Acquired absence of right breast and nipple: Secondary | ICD-10-CM

## 2017-12-02 DIAGNOSIS — Z882 Allergy status to sulfonamides status: Secondary | ICD-10-CM | POA: Diagnosis not present

## 2017-12-02 DIAGNOSIS — Z853 Personal history of malignant neoplasm of breast: Secondary | ICD-10-CM | POA: Diagnosis not present

## 2017-12-02 DIAGNOSIS — Z7901 Long term (current) use of anticoagulants: Secondary | ICD-10-CM

## 2017-12-02 DIAGNOSIS — I4819 Other persistent atrial fibrillation: Secondary | ICD-10-CM

## 2017-12-02 DIAGNOSIS — I10 Essential (primary) hypertension: Secondary | ICD-10-CM | POA: Diagnosis present

## 2017-12-02 DIAGNOSIS — F419 Anxiety disorder, unspecified: Secondary | ICD-10-CM | POA: Diagnosis present

## 2017-12-02 DIAGNOSIS — I4891 Unspecified atrial fibrillation: Secondary | ICD-10-CM | POA: Diagnosis not present

## 2017-12-02 DIAGNOSIS — N182 Chronic kidney disease, stage 2 (mild): Secondary | ICD-10-CM | POA: Diagnosis not present

## 2017-12-02 DIAGNOSIS — I129 Hypertensive chronic kidney disease with stage 1 through stage 4 chronic kidney disease, or unspecified chronic kidney disease: Secondary | ICD-10-CM | POA: Diagnosis not present

## 2017-12-02 DIAGNOSIS — I481 Persistent atrial fibrillation: Secondary | ICD-10-CM | POA: Diagnosis not present

## 2017-12-02 LAB — BASIC METABOLIC PANEL
Anion gap: 14 (ref 5–15)
BUN: 17 mg/dL (ref 8–23)
CO2: 24 mmol/L (ref 22–32)
CREATININE: 1.4 mg/dL — AB (ref 0.44–1.00)
Calcium: 9.4 mg/dL (ref 8.9–10.3)
Chloride: 103 mmol/L (ref 98–111)
GFR calc Af Amer: 40 mL/min — ABNORMAL LOW (ref >60)
GFR, EST NON AFRICAN AMERICAN: 35 mL/min — AB (ref >60)
Glucose, Bld: 107 mg/dL — ABNORMAL HIGH (ref 70–99)
POTASSIUM: 3.7 mmol/L (ref 3.5–5.1)
SODIUM: 141 mmol/L (ref 135–145)

## 2017-12-02 LAB — MAGNESIUM: MAGNESIUM: 2.1 mg/dL (ref 1.7–2.4)

## 2017-12-02 LAB — POCT INR: INR: 2.8 (ref 2.0–3.0)

## 2017-12-02 LAB — POTASSIUM: Potassium: 4.3 mmol/L (ref 3.5–5.1)

## 2017-12-02 MED ORDER — DOFETILIDE 125 MCG PO CAPS: 125.0000 ug | ORAL_CAPSULE | Freq: Two times a day (BID) | ORAL | Status: DC

## 2017-12-02 MED ORDER — SODIUM CHLORIDE 0.9 % IV SOLN
250.0000 mL | INTRAVENOUS | Status: DC | PRN
  Administered 2017-12-04: 15:00:00 via INTRAVENOUS

## 2017-12-02 MED ORDER — WARFARIN SODIUM 2.5 MG PO TABS
2.5000 mg | ORAL_TABLET | Freq: Every day | ORAL | Status: DC
  Administered 2017-12-02 – 2017-12-04 (×3): 2.5 mg via ORAL
  Filled 2017-12-02 (×3): qty 1

## 2017-12-02 MED ORDER — SODIUM CHLORIDE 0.9% FLUSH
3.0000 mL | Freq: Two times a day (BID) | INTRAVENOUS | Status: DC
  Administered 2017-12-02 – 2017-12-03 (×3): 3 mL via INTRAVENOUS

## 2017-12-02 MED ORDER — FLUTICASONE FUROATE-VILANTEROL 100-25 MCG/INH IN AEPB: 1.0000 | INHALATION_SPRAY | Freq: Every day | RESPIRATORY_TRACT | Status: DC | PRN

## 2017-12-02 MED ORDER — WARFARIN - PHARMACIST DOSING INPATIENT
Freq: Every day | Status: DC
  Administered 2017-12-03: 17:00:00

## 2017-12-02 MED ORDER — DOFETILIDE 125 MCG PO CAPS
125.0000 ug | ORAL_CAPSULE | Freq: Two times a day (BID) | ORAL | Status: DC
  Administered 2017-12-02 – 2017-12-05 (×6): 125 ug via ORAL
  Filled 2017-12-02 (×6): qty 1

## 2017-12-02 MED ORDER — FLUTICASONE PROPIONATE 50 MCG/ACT NA SUSP: 2.0000 | Freq: Every day | NASAL | Status: DC | PRN

## 2017-12-02 MED ORDER — PANTOPRAZOLE SODIUM 40 MG PO TBEC: 40.0000 mg | DELAYED_RELEASE_TABLET | Freq: Every day | ORAL | Status: DC | PRN

## 2017-12-02 MED ORDER — ALPRAZOLAM 0.5 MG PO TABS
0.5000 mg | ORAL_TABLET | Freq: Every day | ORAL | Status: DC
  Administered 2017-12-02 – 2017-12-04 (×3): 0.5 mg via ORAL
  Filled 2017-12-02 (×3): qty 1

## 2017-12-02 MED ORDER — POTASSIUM CHLORIDE CRYS ER 20 MEQ PO TBCR
30.0000 meq | EXTENDED_RELEASE_TABLET | Freq: Once | ORAL | Status: AC
  Administered 2017-12-02: 30 meq via ORAL
  Filled 2017-12-02: qty 1

## 2017-12-02 MED ORDER — SODIUM CHLORIDE 0.9% FLUSH: 3.0000 mL | INTRAVENOUS | Status: DC | PRN

## 2017-12-02 MED ORDER — METOPROLOL TARTRATE 25 MG PO TABS
25.0000 mg | ORAL_TABLET | Freq: Two times a day (BID) | ORAL | Status: DC
  Administered 2017-12-02 – 2017-12-05 (×6): 25 mg via ORAL
  Filled 2017-12-02 (×6): qty 1

## 2017-12-02 MED ORDER — FUROSEMIDE 40 MG PO TABS
40.0000 mg | ORAL_TABLET | Freq: Every day | ORAL | Status: DC
  Administered 2017-12-03 – 2017-12-05 (×3): 40 mg via ORAL
  Filled 2017-12-02 (×3): qty 1

## 2017-12-02 NOTE — Progress Notes (Signed)
Primary Care Physician: Sharilyn Sites, MD Referring Physician: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 female with a h/o persistent afib, failing amiodarone for N/V last fall, did not tolerate Cardizem for dairrhea and higher BB dose caused shortness of breath. She had opted for rate control at that time.  She was recently evaluated by [name] for rhythm  control and pt is in the afib clinic today to be admitted for Tikosyn. She meets financial assistance and is applying to to get drug free. She has had therapeutic INR's x 4 weeks, as per reporting in Epic. She denies any benadryl use.  Today, she denies symptoms of palpitations, chest pain, shortness of breath, orthopnea, PND, lower extremity edema, dizziness, presyncope, syncope, or neurologic sequela. The patient is tolerating medications without difficulties and is otherwise without complaint today.   Past Medical History:  Diagnosis Date  . Anxiety   . Arthritis   . Asthma   . Atrial fibrillation ([place])    a. s/p unsuccessful DCCV in 01/2017 and intolerant to Amiodarone --> Rate-control strategy pursued since  . Cancer [facility]) 2001   right breast  . Dyspnea   . GERD (gastroesophageal reflux disease)   . Hypertension    Past Surgical History:  Procedure Laterality Date  . CARDIOVERSION N/A 02/18/2017   Procedure: CARDIOVERSION;  Surgeon: Josue Hector, MD;  Location: [name] ENDOSCOPY;  Service: Cardiovascular;  Laterality: N/A;  . CATARACT EXTRACTION W/PHACO  07/23/2011   Procedure: CATARACT EXTRACTION PHACO AND INTRAOCULAR LENS PLACEMENT ([place]);  Surgeon: Williams Che, MD;  Location: [facility] ORS;  Service: Ophthalmology;  Laterality: Right;  CDE:9.96  . CHOLECYSTECTOMY N/A 02/14/2015   Procedure: LAPAROSCOPIC CHOLECYSTECTOMY;  Surgeon: Aviva Signs, MD;  Location: [facility] ORS;  Service: General;  Laterality: N/A;  . MASTECTOMY     bilateral mastectomy-right breast cancer-left taken by choice  . SEPTOPLASTY  1995      Current Outpatient Medications  Medication Sig Dispense Refill  . ALPRAZolam (XANAX) 0.5 MG tablet Take 0.5 mg by mouth at bedtime.     Marland Kitchen BREO ELLIPTA 100-25 MCG/INH AEPB Inhale 1 puff into the lungs daily as needed.     . fluticasone (FLONASE) 50 MCG/ACT nasal spray Place 2 sprays into both nostrils daily as needed for allergies.     . furosemide (LASIX) 40 MG tablet Take 1 tablet (40 mg total) by mouth daily. 30 tablet 6  . metoprolol tartrate (LOPRESSOR) 50 MG tablet Take 25 mg by mouth 2 (two) times daily.    Marland Kitchen omeprazole (PRILOSEC) 20 MG capsule Take 20 mg daily as needed by mouth (indigestion).     . potassium chloride SA (K-DUR,KLOR-CON) 20 MEQ tablet Take 1 tablet (20 mEq total) by mouth once. (Patient taking differently: Take 20 mEq daily by mouth. ) 90 tablet 3  . warfarin (COUMADIN) 2.5 MG tablet TAKE ONE TABLET (2.5MG  TOTAL) ONCE DAILYEXCEPT SUNDAYS TAKE TWO TABLETS (5MG  TOTAL) (Patient taking differently: Take 2.5 mg by mouth See admin instructions. 5 mg on Sunday, all other days 2.5 mg) 96 tablet 0   No current facility-administered medications for this encounter.     Allergies  Allergen Reactions  . Cardizem Cd [Diltiazem Hcl Er Beads] Diarrhea  . Amiodarone Nausea And Vomiting  . Metoprolol Tartrate   . Sulfa Antibiotics Nausea And Vomiting  . Carvedilol Rash  . Chocolate Rash    Dark chocolate    Social History   Socioeconomic History  . Marital  status: Married    Spouse name: Not on file  . Number of children: Not on file  . Years of education: Not on file  . Highest education level: Not on file  Occupational History  . Not on file  Social Needs  . Financial resource strain: Not on file  . Food insecurity:    Worry: Not on file    Inability: Not on file  . Transportation needs:    Medical: Not on file    Non-medical: Not on file  Tobacco Use  . Smoking status: Former Smoker    Packs/day: 1.00    Years: 28.00    Pack years: 28.00    Types:  Cigarettes    Last attempt to quit: 07/17/1983    Years since quitting: 34.4  . Smokeless tobacco: Never Used  Substance and Sexual Activity  . Alcohol use: No  . Drug use: No  . Sexual activity: Never    Birth control/protection: Post-menopausal  Lifestyle  . Physical activity:    Days per week: Not on file    Minutes per session: Not on file  . Stress: Not on file  Relationships  . Social connections:    Talks on phone: Not on file    Gets together: Not on file    Attends religious service: Not on file    Active member of club or organization: Not on file    Attends meetings of clubs or organizations: Not on file    Relationship status: Not on file  . Intimate partner violence:    Fear of current or ex partner: Not on file    Emotionally abused: Not on file    Physically abused: Not on file    Forced sexual activity: Not on file  Other Topics Concern  . Not on file  Social History Narrative  . Not on file    Family History  Problem Relation Age of Onset  . Cancer Mother   . Aneurysm Father   . Cancer Sister   . Cancer Sister   . Anesthesia problems Neg Hx   . Hypotension Neg Hx   . Malignant hyperthermia Neg Hx   . Pseudochol deficiency Neg Hx     ROS- All systems are reviewed and negative except as per the HPI above  Physical Exam: Vitals:   12/02/17 1157  BP: 136/84  Pulse: (!) 128  Weight: 63 kg  Height: 5\' 4"  (1.626 m)   Wt Readings from Last 3 Encounters:  12/02/17 63 kg  11/13/17 64.9 kg  09/20/17 66.2 kg    Labs: Lab Results  Component Value Date   NA 138 08/29/2017   K 3.6 08/29/2017   CL 103 08/29/2017   CO2 27 08/29/2017   GLUCOSE 122 (H) 08/29/2017   BUN 16 08/29/2017   CREATININE 1.36 (H) 08/29/2017   CALCIUM 9.5 08/29/2017   MG 2.0 08/29/2017   Lab Results  Component Value Date   INR 2.8 12/02/2017   Lab Results  Component Value Date   CHOL 109 09/24/2016   HDL 34 (L) 09/24/2016   LDLCALC 62 09/24/2016   TRIG 66  09/24/2016     GEN- The patient is well appearing, alert and oriented x 3 today.   Head- normocephalic, atraumatic Eyes-  Sclera clear, conjunctiva pink Ears- hearing intact Oropharynx- clear Neck- supple, no JVP Lymph- no cervical lymphadenopathy Lungs- Clear to ausculation bilaterally, normal work of breathing Heart- irregular rate and rhythm, no murmurs, rubs or gallops, PMI  not laterally displaced GI- soft, NT, ND, + BS Extremities- no clubbing, cyanosis, or edema MS- no significant deformity or atrophy Skin- no rash or lesion Psych- euthymic mood, full affect Neuro- strength and sensation are intact  EKG- Afib at 128 bpm , qtc 473 ms INR's reviewed which have been therapeutic,starting 8/23, including today at 2.8    Assessment and Plan: 1. Persistent  afib Failed amiodarone last fall, intolerant to Cardizem 2/2 diarrhea and higher doses of BB 2/2 shortness of breath Here today for Tikosyn admit per [name] Per [name] is she  fails Tikosyn, she would be an AV nodal ablation/PPM candidate General guidelines with Tikosyn reviewed with pt Applying to pt assistance to get drug free No benadryl use Therapeutic INR's  Bmet/cbc pending qtc is 474 msis afib with RVR,  hard to know at baseline what qtc really is, [name] okay'ed   Drugs have been screened by PharmD and no issues with current drugs Admit to bed [place]. [place], [facility] [address] [place], [place] [address] [phone]

## 2017-12-02 NOTE — Progress Notes (Signed)
ANTICOAGULATION CONSULT NOTE - Follow Up Consult  Pharmacy Consult for Warfarin Indication: atrial fibrillation  Allergies  Allergen Reactions  . Cardizem Cd [Diltiazem Hcl Er Beads] Diarrhea  . Amiodarone Nausea And Vomiting  . Metoprolol Tartrate Diarrhea  . Sulfa Antibiotics Nausea And Vomiting  . Tomato Other (See Comments)    Burns stomach  . Carvedilol Rash  . Chocolate Rash    Dark chocolate    Labs: Recent Labs    12/02/17 1032 12/02/17 1218  INR 2.8  --   CREATININE  --  1.40*    Estimated Creatinine Clearance: 28.1 mL/min (A) (by C-G formula based on SCr of 1.4 mg/dL (H)).  Assessment: 77 year old female admitted for Tikosyn initiation on warfarin prior to admission for Afib INR therapeutic on admission Dose prior to admission = 2.5 mg po daily except 5 mg on Sundays  Goal of Therapy:  INR 2-3 Monitor platelets by anticoagulation protocol: Yes   Plan:  Warfarin 2.5 mg po daily at 1800 pm for now Daily INR  Thank you Anette Guarneri, PharmD [phone] 12/02/2017,1:53 PM

## 2017-12-02 NOTE — H&P (Addendum)
Cardiology Admission History and Physical:   [name] MRN: [id number]; DOB: 1940/08/08   Admission date: (Not on file)  Primary Care Provider: Sharilyn Sites, MD Primary Cardiologist: Kate Sable, MD  Primary Electrophysiologist:  [name]  Chief Complaint:  Tikosyn initiation  Patient Profile:   Bailey Bishop is a 77 y.o. female with PMHx of HTN, GERD, anxiety, and persistent AFib.  History of Present Illness:   [name] is admitted today for Tikosyn initiation.  She is followed by [name] out patient, with h/o failing amiodarone for N/V last fall, did not tolerate Cardizem for dairrhea and higher BB dose caused shortness of breath. She at that time opted for rate control.  She was recently evaluated by [name] and decided to pursue rhythm  control and referred to the Afib clinic to arrange Tikosyn initiation.  She was seen this morning in the afib clinic, there noted she meets financial assistance and is applying to to get drug free. She has had therapeutic INR's x 4 weeks, as per reporting in Epic. She denied any benadryl use.  AFib makes her feel particularly fatigued with poor exertional capacity, outside of this she is feeling well.  No recent illness, fever, no CP.  Discussed potential benefit and risk of Tikosyn, the patient is agreeable to proceed, discussed DCCV Wed if not in SR, she has had this before, familiar with the procedure and agreeable to proceed if needed.   Past Medical History:  Diagnosis Date  . Anxiety   . Arthritis   . Asthma   . Atrial fibrillation ([place])    a. s/p unsuccessful DCCV in 01/2017 and intolerant to Amiodarone --> Rate-control strategy pursued since  . Cancer [facility]) 2001   right breast  . Dyspnea   . GERD (gastroesophageal reflux disease)   . Hypertension     Past Surgical History:  Procedure Laterality Date  . CARDIOVERSION N/A 02/18/2017   Procedure: CARDIOVERSION;  Surgeon: Josue Hector, MD;  Location: [facility] ENDOSCOPY;  Service: Cardiovascular;  Laterality: N/A;  . CATARACT EXTRACTION W/PHACO  07/23/2011   Procedure: CATARACT EXTRACTION PHACO AND INTRAOCULAR LENS PLACEMENT ([place]);  Surgeon: Williams Che, MD;  Location: [facility] ORS;  Service: Ophthalmology;  Laterality: Right;  CDE:9.96  . CHOLECYSTECTOMY N/A 02/14/2015   Procedure: LAPAROSCOPIC CHOLECYSTECTOMY;  Surgeon: Aviva Signs, MD;  Location: [facility] ORS;  Service: General;  Laterality: N/A;  . MASTECTOMY     bilateral mastectomy-right breast cancer-left taken by choice  . SEPTOPLASTY  1995     Medications Prior to Admission: Prior to Admission medications   Medication Sig Start Date End Date Taking? Authorizing Provider  ALPRAZolam Duanne Moron) 0.5 MG tablet Take 0.5 mg by mouth at bedtime.  06/18/14   [provider]  BREO ELLIPTA 100-25 MCG/INH AEPB Inhale 1 puff into the lungs daily as needed.  01/31/15   [provider]  fluticasone (FLONASE) 50 MCG/ACT nasal spray Place 2 sprays into both nostrils daily as needed for allergies.  01/29/17   [provider]  furosemide (LASIX) 40 MG tablet Take 1 tablet (40 mg total) by mouth daily. 04/04/17   Dhungel, Flonnie Overman, MD  metoprolol tartrate (LOPRESSOR) 50 MG tablet Take 25 mg by mouth 2 (two) times daily. 04/01/17   [provider]  omeprazole (PRILOSEC) 20 MG capsule Take 20 mg daily as needed by mouth (indigestion).  02/14/16   [provider]  potassium chloride SA (K-DUR,KLOR-CON) 20 MEQ tablet Take 1 tablet (  20 mEq total) by mouth once. Patient taking differently: Take 20 mEq daily by mouth.  10/25/16 01/22/18  Lendon Colonel, NP  warfarin (COUMADIN) 2.5 MG tablet TAKE ONE TABLET (2.5MG  TOTAL) ONCE DAILYEXCEPT SUNDAYS TAKE TWO TABLETS (5MG  TOTAL) Patient taking differently: Take 2.5 mg by mouth See admin instructions. 5 mg on Sunday, all other days 2.5 mg 10/28/17   Herminio Commons, MD     Allergies:    Allergies  Allergen  Reactions  . Cardizem Cd [Diltiazem Hcl Er Beads] Diarrhea  . Amiodarone Nausea And Vomiting  . Metoprolol Tartrate   . Sulfa Antibiotics Nausea And Vomiting  . Carvedilol Rash  . Chocolate Rash    Dark chocolate    Social History:   Social History   Socioeconomic History  . Marital status: Married    Spouse name: Not on file  . Number of children: Not on file  . Years of education: Not on file  . Highest education level: Not on file  Occupational History  . Not on file  Social Needs  . Financial resource strain: Not on file  . Food insecurity:    Worry: Not on file    Inability: Not on file  . Transportation needs:    Medical: Not on file    Non-medical: Not on file  Tobacco Use  . Smoking status: Former Smoker    Packs/day: 1.00    Years: 28.00    Pack years: 28.00    Types: Cigarettes    Last attempt to quit: 07/17/1983    Years since quitting: 34.4  . Smokeless tobacco: Never Used  Substance and Sexual Activity  . Alcohol use: No  . Drug use: No  . Sexual activity: Never    Birth control/protection: Post-menopausal  Lifestyle  . Physical activity:    Days per week: Not on file    Minutes per session: Not on file  . Stress: Not on file  Relationships  . Social connections:    Talks on phone: Not on file    Gets together: Not on file    Attends religious service: Not on file    Active member of club or organization: Not on file    Attends meetings of clubs or organizations: Not on file    Relationship status: Not on file  . Intimate partner violence:    Fear of current or ex partner: Not on file    Emotionally abused: Not on file    Physically abused: Not on file    Forced sexual activity: Not on file  Other Topics Concern  . Not on file  Social History Narrative  . Not on file    Family History:   The patient's family history includes Aneurysm in her father; Cancer in her mother, sister, and sister. There is no history of Anesthesia problems,  Hypotension, Malignant hyperthermia, or Pseudochol deficiency.    ROS:  Please see the history of present illness.  All other ROS reviewed and negative.     Physical Exam/Data:  There were no vitals filed for this visit. No intake or output data in the 24 hours ending 12/02/17 1244 There were no vitals filed for this visit. There is no height or weight on file to calculate BMI.  General:  Well nourished, well developed, in no acute distress HEENT: normal Lymph: no adenopathy Neck: no JVD Endocrine:  No thryomegaly Vascular: No carotid bruits  Cardiac:  iRRR; tachycardic, no murmurs, gallops or rubs Lungs:  CTA  b/l, no wheezing, rhonchi or rales  Abd: soft, nontender, no hepatomegaly  Ext: no edema Musculoskeletal:  No deformities, age appropriate atrophy Skin: warm and dry  Neuro:  No gross focal abnormalities noted Psych:  Normal affect    EKG:  The ECG that was done today was personally reviewed and demonstrates AFib 128bpm  Relevant CV Studies:  09/24/16; TTE Study Conclusions - Left ventricle: The cavity size was normal. Wall thickness was   normal. Systolic function was normal. The estimated ejection   fraction was in the range of 55% to 60%. Wall motion was normal;   there were no regional wall motion abnormalities. - Aortic valve: Mildly to moderately calcified annulus. Mildly   thickened leaflets. Valve area (VTI): 2.92 cm^2. Valve area   (Vmax): 2.73 cm^2. Valve area (Vmean): 2.61 cm^2. - Mitral valve: There was moderate regurgitation. - Left atrium: The atrium was severely dilated. (26mm) - Right atrium: The atrium was severely dilated. - Atrial septum: No defect or patent foramen ovale was identified. - Pulmonary arteries: Systolic pressure was moderately increased.   PA peak pressure: 49 mm Hg (S). - Pericardium, extracardiac: There is a small circumferential   pericardial effusion. - Technically adequate study.  Laboratory Data:  ChemistryNo results for  input(s): NA, K, CL, CO2, GLUCOSE, BUN, CREATININE, CALCIUM, GFRNONAA, GFRAA, ANIONGAP in the last 168 hours.  No results for input(s): PROT, ALBUMIN, AST, ALT, ALKPHOS, BILITOT in the last 168 hours. HematologyNo results for input(s): WBC, RBC, HGB, HCT, MCV, MCH, MCHC, RDW, PLT in the last 168 hours. Cardiac EnzymesNo results for input(s): TROPONINI in the last 168 hours. No results for input(s): TROPIPOC in the last 168 hours.  BNPNo results for input(s): BNP, PROBNP in the last 168 hours.  DDimer No results for input(s): DDIMER in the last 168 hours.  Radiology/Studies:  No results found.  Assessment and Plan:   1. Persistent AFib     CHA2DS2Vasc is 4, on warfarin     K+ 3.7, replacement ordered, OK to start     Mag 2.1     Creat 1.40, (calc Cr. Cl is 32)     QT, pt with fast AF on her EKG, difficult to assess QT, cleared by [name] to start     INR 2.8 today (she had had therapeutic weekly INRs for 1 month)   Plan for DCCV Wed if not in SR   2. HTN      Continue home meds   For questions or updates, please contact [place] Please consult [email or web address] for contact info under        Signed, Baldwin Jamaica, PA-C  12/02/2017 12:44 PM   EP Attending  Patient seen and examined. Agree with above. The patient presents for initiation of dofetilide for atrial fib. She has a reduced creatinine clearance and will start 125 bid of dofetilide. INR has been therapeutic. Her QT has been reviewed by me and is acceptable. Plan for DCCV if she does not convert on her own on Wednesday or Thursday.  Mikle Bosworth.D.

## 2017-12-02 NOTE — Patient Instructions (Addendum)
Weekly INR for Tikosyn admission. (4th today)  Being admitted today. Continue coumadin 1 tablet daily except 2 tablets on Sundays Recheck 1 week after admission

## 2017-12-03 LAB — BASIC METABOLIC PANEL
Anion gap: 11 (ref 5–15)
BUN: 13 mg/dL (ref 8–23)
CALCIUM: 9.5 mg/dL (ref 8.9–10.3)
CO2: 24 mmol/L (ref 22–32)
Chloride: 102 mmol/L (ref 98–111)
Creatinine, Ser: 1.31 mg/dL — ABNORMAL HIGH (ref 0.44–1.00)
GFR calc Af Amer: 44 mL/min — ABNORMAL LOW (ref >60)
GFR, EST NON AFRICAN AMERICAN: 38 mL/min — AB (ref >60)
GLUCOSE: 93 mg/dL (ref 70–99)
Potassium: 4.1 mmol/L (ref 3.5–5.1)
Sodium: 137 mmol/L (ref 135–145)

## 2017-12-03 LAB — PROTIME-INR
INR: 2.58
Prothrombin Time: 27.5 seconds — ABNORMAL HIGH (ref 11.4–15.2)

## 2017-12-03 LAB — MAGNESIUM: MAGNESIUM: 2 mg/dL (ref 1.7–2.4)

## 2017-12-03 MED ORDER — SODIUM CHLORIDE 0.9% FLUSH: 3.0000 mL | INTRAVENOUS | Status: DC | PRN

## 2017-12-03 MED ORDER — SODIUM CHLORIDE 0.9% FLUSH
3.0000 mL | Freq: Two times a day (BID) | INTRAVENOUS | Status: DC
  Administered 2017-12-03 – 2017-12-05 (×4): 3 mL via INTRAVENOUS

## 2017-12-03 MED ORDER — SODIUM CHLORIDE 0.9 % IV SOLN: 250.0000 mL | INTRAVENOUS | Status: DC

## 2017-12-03 MED ORDER — HYDROCORTISONE 1 % EX CREA
1.0000 "application " | TOPICAL_CREAM | Freq: Three times a day (TID) | CUTANEOUS | Status: DC | PRN
  Filled 2017-12-03: qty 28

## 2017-12-03 NOTE — Progress Notes (Addendum)
Progress Note  Patient Name: Bailey Bishop Date of Encounter: 12/03/2017  Primary Cardiologist: Kate Sable, MD   Subjective   Aware of her AFib, no CP, no rest SOB, a little nauseous this AM  Inpatient Medications    Scheduled Meds: . ALPRAZolam  0.5 mg Oral QHS  . dofetilide  125 mcg Oral BID  . furosemide  40 mg Oral Daily  . metoprolol tartrate  25 mg Oral BID  . sodium chloride flush  3 mL Intravenous Q12H  . sodium chloride flush  3 mL Intravenous Q12H  . warfarin  2.5 mg Oral q1800  . Warfarin - Pharmacist Dosing Inpatient   Does not apply q1800   Continuous Infusions: . sodium chloride    . sodium chloride     PRN Meds: sodium chloride, hydrocortisone cream, pantoprazole, sodium chloride flush, sodium chloride flush   Vital Signs    Vitals:   12/02/17 1327 12/02/17 2105 12/03/17 0544  BP: 132/76 120/83 135/90  Pulse: (!) 108 (!) 110 (!) 114  Resp: 16    Temp: 98.7 F (37.1 C) 98.4 F (36.9 C) 99.1 F (37.3 C)  TempSrc: Oral Oral Oral  SpO2: 95% 94% 94%  Weight: 62.6 kg  61.7 kg  Height: 5\' 4"  (1.626 m)      Intake/Output Summary (Last 24 hours) at 12/03/2017 0719 Last data filed at 12/03/2017 0500 Gross per 24 hour  Intake 486 ml  Output 200 ml  Net 286 ml   Filed Weights   12/02/17 1327 12/03/17 0544  Weight: 62.6 kg 61.7 kg    Telemetry    AFib , low 100's, occ PVC's noted pre-drug - Personally Reviewed  ECG    AFib 105bpm, measured QT 397ms,QTc 483ms - Personally Reviewed  Physical Exam   GEN: No acute distress.   Neck: No JVD Cardiac: iRRR, no murmurs, rubs, or gallops.  Respiratory: CTA b/l GI: Soft, nontender, non-distended  MS: No edema; No deformity. Neuro:  Nonfocal  Psych: Normal affect   Labs    Chemistry Recent Labs  Lab 12/02/17 1218 12/02/17 2031  NA 141  --   K 3.7 4.3  CL 103  --   CO2 24  --   GLUCOSE 107*  --   BUN 17  --   CREATININE 1.40*  --   CALCIUM 9.4  --   GFRNONAA 35*  --     GFRAA 40*  --   ANIONGAP 14  --      HematologyNo results for input(s): WBC, RBC, HGB, HCT, MCV, MCH, MCHC, RDW, PLT in the last 168 hours.  Cardiac EnzymesNo results for input(s): TROPONINI in the last 168 hours. No results for input(s): TROPIPOC in the last 168 hours.   BNPNo results for input(s): BNP, PROBNP in the last 168 hours.   DDimer No results for input(s): DDIMER in the last 168 hours.   Radiology    No results found.  Cardiac Studies   09/24/16; TTE Study Conclusions - Left ventricle: The cavity size was normal. Wall thickness was normal. Systolic function was normal. The estimated ejection fraction was in the range of 55% to 60%. Wall motion was normal; there were no regional wall motion abnormalities. - Aortic valve: Mildly to moderately calcified annulus. Mildly thickened leaflets. Valve area (VTI): 2.92 cm^2. Valve area (Vmax): 2.73 cm^2. Valve area (Vmean): 2.61 cm^2. - Mitral valve: There was moderate regurgitation. - Left atrium: The atrium was severely dilated. (53mm) - Right atrium: The atrium  was severely dilated. - Atrial septum: No defect or patent foramen ovale was identified. - Pulmonary arteries: Systolic pressure was moderately increased. PA peak pressure: 49 mm Hg (S). - Pericardium, extracardiac: There is a small circumferential pericardial effusion. - Technically adequate study.  Patient Profile     77 y.o. female with PMHx of HTN, GERD, anxiety, and persistent AFib, admitted for Tikosyn initiation  Assessment & Plan    1. Persistent AFib     CHA2DS2Vasc is 4, on warfarin     K+ 4.1     Mag 2.0     Creat 1.31, stable     INR 2.58  QTc stable  Plan for DCCV tomorrow if not in SR   2. HTN     no changes  For questions or updates, please contact [place] Please consult [email or web address] for contact info under        Signed, Baldwin Jamaica, PA-C  12/03/2017, 7:19 AM    EP Attending  Patient seen and  examined. She is doing well though she has not yet reverted back to NSR. She will continue dofetilide. Follow electrolytes. DCCV tomorrow if not back to NSR.  Mikle Bosworth.D.

## 2017-12-03 NOTE — Care Management (Signed)
12-03-17   BENEFITS CHECK :  #  5.  S/W [facility] [place] RX # [phone]  1.TIKOSYN   125 MCG, 250 MCG AND 500 MCG  BID COVER-  NONE FORMULARY PRIOR APPROVAL- YES  # [phone] FOR EXCEPTION  2. DOFETILIDE   125 MCG , 250 MCG AND 500 MCG BID COVER- YES CO-PAY $ 145.00   FOR EACH RX TIER- 4 DRUG PRIOR APPROVAL- NO  DEDUCTIBLE: NOT MET  PREFERRED PHARMACY ; YES   [facility] 90 DAY SUPPLY M/O OR RETAIL  $ 325.00

## 2017-12-03 NOTE — Progress Notes (Signed)
ANTICOAGULATION CONSULT NOTE - Follow Up Consult  Pharmacy Consult for Warfarin Indication: atrial fibrillation  Allergies  Allergen Reactions  . Cardizem Cd [Diltiazem Hcl Er Beads] Diarrhea  . Amiodarone Nausea And Vomiting  . Metoprolol Tartrate Diarrhea  . Sulfa Antibiotics Nausea And Vomiting  . Tomato Other (See Comments)    Burns stomach  . Carvedilol Rash  . Chocolate Rash    Dark chocolate    Labs: Recent Labs    12/02/17 1032 12/02/17 1218 12/03/17 0733  LABPROT  --   --  27.5*  INR 2.8  --  2.58  CREATININE  --  1.40* 1.31*    Estimated Creatinine Clearance: 30.1 mL/min (A) (by C-G formula based on SCr of 1.31 mg/dL (H)).  Assessment: 77 year old female admitted for Tikosyn initiation on warfarin prior to admission for Afib INR therapeutic Dose prior to admission = 2.5 mg po daily except 5 mg on Sundays  Goal of Therapy:  INR 2-3 Monitor platelets by anticoagulation protocol: Yes   Plan:  Warfarin 2.5 mg po daily at 1800 pm for now Daily INR  Thank you Anette Guarneri, PharmD [phone] 12/03/2017,1:11 PM

## 2017-12-04 ENCOUNTER — Encounter (HOSPITAL_COMMUNITY): Payer: Self-pay | Admitting: Cardiovascular Disease

## 2017-12-04 ENCOUNTER — Inpatient Hospital Stay (HOSPITAL_COMMUNITY): Payer: Medicare Other | Admitting: Anesthesiology

## 2017-12-04 ENCOUNTER — Ambulatory Visit (HOSPITAL_COMMUNITY): Admit: 2017-12-04 | Payer: Medicare Other | Admitting: Cardiovascular Disease

## 2017-12-04 ENCOUNTER — Encounter (HOSPITAL_COMMUNITY)
Admission: RE | Disposition: A | Discharge status: Home / Self Care | Payer: Self-pay | Source: Ambulatory Visit | Attending: Internal Medicine

## 2017-12-04 HISTORY — PX: CARDIOVERSION: SHX1299

## 2017-12-04 LAB — BASIC METABOLIC PANEL
Anion gap: 7 (ref 5–15)
BUN: 15 mg/dL (ref 8–23)
CO2: 27 mmol/L (ref 22–32)
Calcium: 9.3 mg/dL (ref 8.9–10.3)
Chloride: 104 mmol/L (ref 98–111)
Creatinine, Ser: 1.37 mg/dL — ABNORMAL HIGH (ref 0.44–1.00)
GFR calc Af Amer: 41 mL/min — ABNORMAL LOW (ref >60)
GFR, EST NON AFRICAN AMERICAN: 36 mL/min — AB (ref >60)
GLUCOSE: 99 mg/dL (ref 70–99)
POTASSIUM: 4 mmol/L (ref 3.5–5.1)
Sodium: 138 mmol/L (ref 135–145)

## 2017-12-04 LAB — PROTIME-INR
INR: 2.75
Prothrombin Time: 28.8 seconds — ABNORMAL HIGH (ref 11.4–15.2)

## 2017-12-04 LAB — MAGNESIUM: Magnesium: 2 mg/dL (ref 1.7–2.4)

## 2017-12-04 SURGERY — CARDIOVERSION
Anesthesia: General

## 2017-12-04 MED ORDER — LIDOCAINE HCL (CARDIAC) PF 100 MG/5ML IV SOSY
PREFILLED_SYRINGE | INTRAVENOUS | Status: DC | PRN
  Administered 2017-12-04: 40 mg via INTRAVENOUS

## 2017-12-04 MED ORDER — SODIUM CHLORIDE 0.9 % IV SOLN
INTRAVENOUS | Status: DC
  Administered 2017-12-04: 500 mL via INTRAVENOUS

## 2017-12-04 MED ORDER — PROPOFOL 10 MG/ML IV BOLUS
INTRAVENOUS | Status: DC | PRN
  Administered 2017-12-04: 50 mg via INTRAVENOUS

## 2017-12-04 NOTE — Anesthesia Postprocedure Evaluation (Signed)
Anesthesia Post Note  Patient: Bailey Bishop  Procedure(s) Performed: CARDIOVERSION (N/A )     Patient location during evaluation: Endoscopy Anesthesia Type: General Level of consciousness: awake Pain management: pain level controlled Vital Signs Assessment: post-procedure vital signs reviewed and stable Respiratory status: spontaneous breathing Cardiovascular status: stable Postop Assessment: no apparent nausea or vomiting Anesthetic complications: no    Last Vitals:  Vitals:   12/04/17 1512 12/04/17 1516  BP:  118/81  Pulse: 64 95  Resp: 20 15  Temp:  36.9 C  SpO2: 98% 100%    Last Pain:  Vitals:   12/04/17 1516  TempSrc: Oral  PainSc: 0-No pain   Pain Goal: Patients Stated Pain Goal: 0 (12/04/17 0830)               Gaege Sangalang JR,JOHN Mateo Flow

## 2017-12-04 NOTE — Care Management Note (Signed)
Case Management Note  Patient Details  Name: ASJAH RAUDA MRN: [id number] Date of Birth: January 22, 1941  Subjective/Objective:  Pt presented for Tikosyn Load. PTA Independent from home with husband. Benefits Check completed and patient is aware of cost. Patient received co pay of $95.00 for generic via [place]. Patient Assistance Application was filled out in the [facility]- hopefully patient will be approved. CM will assist with Rx for 7 day supply no refills via [facility].               Action/Plan: Patient will need additional Rx for 30 day supply with refills. Patient uses [employer] and medication can be ordered. No further needs from CM at this time.    Expected Discharge Date:                  Expected Discharge Plan:  Home/Self Care  In-House Referral:  NA  Discharge planning Services  CM Consult, Medication Assistance  Post Acute Care Choice:  NA Choice offered to:  NA  DME Arranged:  N/A DME Agency:  NA  HH Arranged:  NA HH Agency:  NA  Status of Service:  Completed, signed off  If discussed at [place] of Stay Meetings, dates discussed:    Additional Comments:  Bethena Roys, RN 12/04/2017, 11:44 AM

## 2017-12-04 NOTE — Discharge Instructions (Addendum)
You have an appointment set up with the [facility].  Multiple studies have shown that being followed by a dedicated atrial fibrillation clinic in addition to the standard care you receive from your other physicians improves health. We believe that enrollment in the atrial fibrillation clinic will allow [place] to better care for you.   The phone number to the [facility] is [phone]. The clinic is staffed Monday through Friday from 8:30am to 5pm.  Parking Directions: The clinic is located in the Heart and Vascular Building connected to [facility]. 1)From [address] turn on to [address] and go to the 3rd entrance  (Heart and Vascular entrance) on the right. 2)Look to the right for Heart &Vascular Parking Garage. 3)A code for the entrance is required please call the clinic to receive this.   4)Take the elevators to the 1st floor. Registration is in the room with the glass walls at the end of the hallway.  If you have any trouble parking or locating the clinic, please dont hesitate to call [phone].  Information on my medicine - Coumadin   (Warfarin)  This medication education was reviewed with me or my healthcare representative as part of my discharge preparation.  The pharmacist that spoke with me during my hospital stay was:  Saundra Shelling, [facility]  Why was Coumadin prescribed for you? Coumadin was prescribed for you because you have a blood clot or a medical condition that can cause an increased risk of forming blood clots. Blood clots can cause serious health problems by blocking the flow of blood to the heart, lung, or brain. Coumadin can prevent harmful blood clots from forming. As a reminder your indication for Coumadin is:   Stroke Prevention Because Of Atrial Fibrillation  What test will check on my response to Coumadin? While on Coumadin (warfarin) you will need to have an INR test regularly to ensure that your dose is keeping you in  the desired range. The INR (international normalized ratio) number is calculated from the result of the laboratory test called prothrombin time (PT).  If an INR APPOINTMENT HAS NOT ALREADY BEEN MADE FOR YOU please schedule an appointment to have this lab work done by your health care provider within 7 days. Your INR goal is usually a number between:  2 to 3 or your provider may give you a more narrow range like 2-2.5.  Ask your health care provider during an office visit what your goal INR is.  What  do you need to  know  About  COUMADIN? Take Coumadin (warfarin) exactly as prescribed by your healthcare provider about the same time each day.  DO NOT stop taking without talking to the doctor who prescribed the medication.  Stopping without other blood clot prevention medication to take the place of Coumadin may increase your risk of developing a new clot or stroke.  Get refills before you run out.  What do you do if you miss a dose? If you miss a dose, take it as soon as you remember on the same day then continue your regularly scheduled regimen the next day.  Do not take two doses of Coumadin at the same time.  Important Safety Information A possible side effect of Coumadin (Warfarin) is an increased risk of bleeding. You should call your healthcare provider right away if you experience any of the following: ? Bleeding from an injury or your nose that does not stop. ? Unusual colored urine (red or dark  brown) or unusual colored stools (red or black). ? Unusual bruising for unknown reasons. ? A serious fall or if you hit your head (even if there is no bleeding).  Some foods or medicines interact with Coumadin (warfarin) and might alter your response to warfarin. To help avoid this: ? Eat a balanced diet, maintaining a consistent amount of Vitamin K. ? Notify your provider about major diet changes you plan to make. ? Avoid alcohol or limit your intake to 1 drink for women and 2 drinks for men per  day. (1 drink is 5 oz. wine, 12 oz. beer, or 1.5 oz. liquor.)  Make sure that ANY health care provider who prescribes medication for you knows that you are taking Coumadin (warfarin).  Also make sure the healthcare provider who is monitoring your Coumadin knows when you have started a new medication including herbals and non-prescription products.  Coumadin (Warfarin)  Major Drug Interactions  Increased Warfarin Effect Decreased Warfarin Effect  Alcohol (large quantities) Antibiotics (esp. Septra/Bactrim, Flagyl, Cipro) Amiodarone (Cordarone) Aspirin (ASA) Cimetidine (Tagamet) Megestrol (Megace) NSAIDs (ibuprofen, naproxen, etc.) Piroxicam (Feldene) Propafenone (Rythmol SR) Propranolol (Inderal) Isoniazid (INH) Posaconazole (Noxafil) Barbiturates (Phenobarbital) Carbamazepine (Tegretol) Chlordiazepoxide (Librium) Cholestyramine (Questran) Griseofulvin Oral Contraceptives Rifampin Sucralfate (Carafate) Vitamin K   Coumadin (Warfarin) Major Herbal Interactions  Increased Warfarin Effect Decreased Warfarin Effect  Garlic Ginseng Ginkgo biloba Coenzyme Q10 Green tea St. Johns wort    Coumadin (Warfarin) FOOD Interactions  Eat a consistent number of servings per week of foods HIGH in Vitamin K (1 serving =  cup)  Collards (cooked, or boiled & drained) Kale (cooked, or boiled & drained) Mustard greens (cooked, or boiled & drained) Parsley *serving size only =  cup Spinach (cooked, or boiled & drained) Swiss chard (cooked, or boiled & drained) Turnip greens (cooked, or boiled & drained)  Eat a consistent number of servings per week of foods MEDIUM-HIGH in Vitamin K (1 serving = 1 cup)  Asparagus (cooked, or boiled & drained) Broccoli (cooked, boiled & drained, or raw & chopped) Brussel sprouts (cooked, or boiled & drained) *serving size only =  cup Lettuce, raw (green leaf, endive, romaine) Spinach, raw Turnip greens, raw & chopped   These websites have more  information on Coumadin (warfarin):  FailFactory.se; VeganReport.com.au;

## 2017-12-04 NOTE — Anesthesia Procedure Notes (Signed)
Procedure Name: General with mask airway Date/Time: 12/04/2017 2:57 PM Performed by: Eligha Bridegroom, CRNA Pre-anesthesia Checklist: Patient being monitored, Suction available, Emergency Drugs available and Patient identified Patient Re-evaluated:Patient Re-evaluated prior to induction Oxygen Delivery Method: Ambu bag Preoxygenation: Pre-oxygenation with 100% oxygen Induction Type: IV induction

## 2017-12-04 NOTE — Progress Notes (Signed)
ANTICOAGULATION CONSULT NOTE - Follow Up Consult  Pharmacy Consult:  Coumadin Indication: atrial fibrillation  Allergies  Allergen Reactions  . Cardizem Cd [Diltiazem Hcl Er Beads] Diarrhea  . Amiodarone Nausea And Vomiting  . Metoprolol Tartrate Diarrhea  . Sulfa Antibiotics Nausea And Vomiting  . Tomato Other (See Comments)    Burns stomach  . Carvedilol Rash  . Chocolate Rash    Dark chocolate    Labs: Recent Labs    12/02/17 1032 12/02/17 1218 12/03/17 0733 12/04/17 0358  LABPROT  --   --  27.5* 28.8*  INR 2.8  --  2.58 2.75  CREATININE  --  1.40* 1.31* 1.37*    Estimated Creatinine Clearance: 28.8 mL/min (A) (by C-G formula based on SCr of 1.37 mg/dL (H)).   Assessment: 77 year old female admitted for Tikosyn initiation.  She continues on Coumadin from PTA for Afib.  INR therapeutic; no bleeding reported.  Coumadin dose prior to admission = 2.5 mg po daily except 5 mg on Sundays   Goal of Therapy:  INR 2-3 Monitor platelets by anticoagulation protocol: Yes    Plan:  Cuomadin 2.5 mg PO daily at 1800 for now Daily PT / INR DCCV today   Alan Riles D. Mina Marble, PharmD, BCPS, [place] 12/04/2017, 10:12 AM

## 2017-12-04 NOTE — Interval H&P Note (Signed)
History and Physical Interval Note:  12/04/2017 9:06 AM  Bailey Bishop  has presented today for surgery, with the diagnosis of a fib, tikosyn load  The various methods of treatment have been discussed with the patient and family. After consideration of risks, benefits and other options for treatment, the patient has consented to  Procedure(s): CARDIOVERSION (N/A) as a surgical intervention .  The patient's history has been reviewed, patient examined, no change in status, stable for surgery.  I have reviewed the patient's chart and labs.  Questions were answered to the patient's satisfaction.     [name], MD

## 2017-12-04 NOTE — Progress Notes (Addendum)
Progress Note  Patient Name: Bailey Bishop Date of Encounter: 12/04/2017  Primary Cardiologist: Kate Sable, MD   Subjective   Aware of her AFib, no CP, no rest SOB, nausea resolved  Inpatient Medications    Scheduled Meds: . ALPRAZolam  0.5 mg Oral QHS  . dofetilide  125 mcg Oral BID  . furosemide  40 mg Oral Daily  . metoprolol tartrate  25 mg Oral BID  . sodium chloride flush  3 mL Intravenous Q12H  . sodium chloride flush  3 mL Intravenous Q12H  . warfarin  2.5 mg Oral q1800  . Warfarin - Pharmacist Dosing Inpatient   Does not apply q1800   Continuous Infusions: . sodium chloride    . sodium chloride     PRN Meds: sodium chloride, hydrocortisone cream, pantoprazole, sodium chloride flush, sodium chloride flush   Vital Signs    Vitals:   12/03/17 0544 12/03/17 1308 12/03/17 2015 12/04/17 0518  BP: 135/90 123/84 (!) 152/92 121/82  Pulse: (!) 114 96 (!) 109 78  Resp:   18 18  Temp: 99.1 F (37.3 C) 98.5 F (36.9 C) 99.7 F (37.6 C) 98.6 F (37 C)  TempSrc: Oral Oral Oral Oral  SpO2: 94% 93% 98% 97%  Weight: 61.7 kg   60.9 kg  Height:        Intake/Output Summary (Last 24 hours) at 12/04/2017 0729 Last data filed at 12/03/2017 1623 Gross per 24 hour  Intake 480 ml  Output 1300 ml  Net -820 ml   Filed Weights   12/02/17 1327 12/03/17 0544 12/04/17 0518  Weight: 62.6 kg 61.7 kg 60.9 kg    Telemetry    AFib , 90's- low 100's, occ PVC's (noted pre-drug), infrequent non-sustained WCT, appears AFib/BBB - Personally Reviewed  ECG    AFib 98bpm, measured QT 331ms,QTc 452ms - Personally Reviewed  Physical Exam   GEN: No acute distress.   Neck: No JVD Cardiac: iRRR, no murmurs, rubs, or gallops.  Respiratory: CTA b/l GI: Soft, nontender, non-distended  MS: No edema; No deformity. Neuro:  Nonfocal  Psych: Normal affect   Labs    Chemistry Recent Labs  Lab 12/02/17 1218 12/02/17 2031 12/03/17 0733 12/04/17 0358  NA 141  --  137  138  K 3.7 4.3 4.1 4.0  CL 103  --  102 104  CO2 24  --  24 27  GLUCOSE 107*  --  93 99  BUN 17  --  13 15  CREATININE 1.40*  --  1.31* 1.37*  CALCIUM 9.4  --  9.5 9.3  GFRNONAA 35*  --  38* 36*  GFRAA 40*  --  44* 41*  ANIONGAP 14  --  11 7     HematologyNo results for input(s): WBC, RBC, HGB, HCT, MCV, MCH, MCHC, RDW, PLT in the last 168 hours.  Cardiac EnzymesNo results for input(s): TROPONINI in the last 168 hours. No results for input(s): TROPIPOC in the last 168 hours.   BNPNo results for input(s): BNP, PROBNP in the last 168 hours.   DDimer No results for input(s): DDIMER in the last 168 hours.   Radiology    No results found.  Cardiac Studies   09/24/16; TTE Study Conclusions - Left ventricle: The cavity size was normal. Wall thickness was normal. Systolic function was normal. The estimated ejection fraction was in the range of 55% to 60%. Wall motion was normal; there were no regional wall motion abnormalities. - Aortic valve: Mildly to  moderately calcified annulus. Mildly thickened leaflets. Valve area (VTI): 2.92 cm^2. Valve area (Vmax): 2.73 cm^2. Valve area (Vmean): 2.61 cm^2. - Mitral valve: There was moderate regurgitation. - Left atrium: The atrium was severely dilated. (27mm) - Right atrium: The atrium was severely dilated. - Atrial septum: No defect or patent foramen ovale was identified. - Pulmonary arteries: Systolic pressure was moderately increased. PA peak pressure: 49 mm Hg (S). - Pericardium, extracardiac: There is a small circumferential pericardial effusion. - Technically adequate study.  Patient Profile     77 y.o. female with PMHx of HTN, GERD, anxiety, and persistent AFib, admitted for Tikosyn initiation  Assessment & Plan    1. Persistent AFib     CHA2DS2Vasc is 4, on warfarin     K+ 4.0     Mag 2.0     Creat 1.37, stable     INR 2.75  QTc stable  Plan for DCCV today   2. HTN     no changes  For  questions or updates, please contact [place] Please consult [email or web address] for contact info under        Signed, Baldwin Jamaica, PA-C  12/04/2017, 7:29 AM    EP Attending  Patient seen and examined. Agree with above. The patient remains in atrial fib and a RVR as well as a CVR. Her QT is difficult to calculate when her rate is high. She will undergo DCCV today and hopefully allow [place] a more accurate assessment of her QT. DC tomorrow if she is ok.  Mikle Bosworth.D.

## 2017-12-04 NOTE — H&P (View-Only) (Signed)
Progress Note  Patient Name: Bailey Bishop Date of Encounter: 12/04/2017  Primary Cardiologist: Kate Sable, MD   Subjective   Aware of her AFib, no CP, no rest SOB, nausea resolved  Inpatient Medications    Scheduled Meds: . ALPRAZolam  0.5 mg Oral QHS  . dofetilide  125 mcg Oral BID  . furosemide  40 mg Oral Daily  . metoprolol tartrate  25 mg Oral BID  . sodium chloride flush  3 mL Intravenous Q12H  . sodium chloride flush  3 mL Intravenous Q12H  . warfarin  2.5 mg Oral q1800  . Warfarin - Pharmacist Dosing Inpatient   Does not apply q1800   Continuous Infusions: . sodium chloride    . sodium chloride     PRN Meds: sodium chloride, hydrocortisone cream, pantoprazole, sodium chloride flush, sodium chloride flush   Vital Signs    Vitals:   12/03/17 0544 12/03/17 1308 12/03/17 2015 12/04/17 0518  BP: 135/90 123/84 (!) 152/92 121/82  Pulse: (!) 114 96 (!) 109 78  Resp:   18 18  Temp: 99.1 F (37.3 C) 98.5 F (36.9 C) 99.7 F (37.6 C) 98.6 F (37 C)  TempSrc: Oral Oral Oral Oral  SpO2: 94% 93% 98% 97%  Weight: 61.7 kg   60.9 kg  Height:        Intake/Output Summary (Last 24 hours) at 12/04/2017 0729 Last data filed at 12/03/2017 1623 Gross per 24 hour  Intake 480 ml  Output 1300 ml  Net -820 ml   Filed Weights   12/02/17 1327 12/03/17 0544 12/04/17 0518  Weight: 62.6 kg 61.7 kg 60.9 kg    Telemetry    AFib , 90's- low 100's, occ PVC's (noted pre-drug), infrequent non-sustained WCT, appears AFib/BBB - Personally Reviewed  ECG    AFib 98bpm, measured QT 392ms,QTc 473ms - Personally Reviewed  Physical Exam   GEN: No acute distress.   Neck: No JVD Cardiac: iRRR, no murmurs, rubs, or gallops.  Respiratory: CTA b/l GI: Soft, nontender, non-distended  MS: No edema; No deformity. Neuro:  Nonfocal  Psych: Normal affect   Labs    Chemistry Recent Labs  Lab 12/02/17 1218 12/02/17 2031 12/03/17 0733 12/04/17 0358  NA 141  --  137  138  K 3.7 4.3 4.1 4.0  CL 103  --  102 104  CO2 24  --  24 27  GLUCOSE 107*  --  93 99  BUN 17  --  13 15  CREATININE 1.40*  --  1.31* 1.37*  CALCIUM 9.4  --  9.5 9.3  GFRNONAA 35*  --  38* 36*  GFRAA 40*  --  44* 41*  ANIONGAP 14  --  11 7     HematologyNo results for input(s): WBC, RBC, HGB, HCT, MCV, MCH, MCHC, RDW, PLT in the last 168 hours.  Cardiac EnzymesNo results for input(s): TROPONINI in the last 168 hours. No results for input(s): TROPIPOC in the last 168 hours.   BNPNo results for input(s): BNP, PROBNP in the last 168 hours.   DDimer No results for input(s): DDIMER in the last 168 hours.   Radiology    No results found.  Cardiac Studies   09/24/16; TTE Study Conclusions - Left ventricle: The cavity size was normal. Wall thickness was normal. Systolic function was normal. The estimated ejection fraction was in the range of 55% to 60%. Wall motion was normal; there were no regional wall motion abnormalities. - Aortic valve: Mildly to  moderately calcified annulus. Mildly thickened leaflets. Valve area (VTI): 2.92 cm^2. Valve area (Vmax): 2.73 cm^2. Valve area (Vmean): 2.61 cm^2. - Mitral valve: There was moderate regurgitation. - Left atrium: The atrium was severely dilated. (40mm) - Right atrium: The atrium was severely dilated. - Atrial septum: No defect or patent foramen ovale was identified. - Pulmonary arteries: Systolic pressure was moderately increased. PA peak pressure: 49 mm Hg (S). - Pericardium, extracardiac: There is a small circumferential pericardial effusion. - Technically adequate study.  Patient Profile     77 y.o. female with PMHx of HTN, GERD, anxiety, and persistent AFib, admitted for Tikosyn initiation  Assessment & Plan    1. Persistent AFib     CHA2DS2Vasc is 4, on warfarin     K+ 4.0     Mag 2.0     Creat 1.37, stable     INR 2.75  QTc stable  Plan for DCCV today   2. HTN     no changes  For  questions or updates, please contact [place] Please consult [email or web address] for contact info under        Signed, Baldwin Jamaica, PA-C  12/04/2017, 7:29 AM    EP Attending  Patient seen and examined. Agree with above. The patient remains in atrial fib and a RVR as well as a CVR. Her QT is difficult to calculate when her rate is high. She will undergo DCCV today and hopefully allow [place] a more accurate assessment of her QT. DC tomorrow if she is ok.  Mikle Bosworth.D.

## 2017-12-04 NOTE — Transfer of Care (Signed)
Immediate Anesthesia Transfer of Care Note  Patient: Bailey Bishop  Procedure(s) Performed: CARDIOVERSION (N/A )  Patient Location: Endoscopy Unit  Anesthesia Type:General  Level of Consciousness: awake, alert  and oriented  Airway & Oxygen Therapy: Patient Spontanous Breathing and Patient connected to nasal cannula oxygen  Post-op Assessment: Report given to RN and Post -op Vital signs reviewed and stable  Post vital signs: Reviewed and stable  Last Vitals:  Vitals Value Taken Time  BP 141/85 12/04/2017  3:11 PM  Temp    Pulse 64 12/04/2017  3:12 PM  Resp 20 12/04/2017  3:12 PM  SpO2 98 % 12/04/2017  3:12 PM    Last Pain:  Vitals:   12/04/17 1423  TempSrc:   PainSc: 0-No pain      Patients Stated Pain Goal: 0 (66/19/69 [id number])  Complications: No apparent anesthesia complications

## 2017-12-04 NOTE — Anesthesia Preprocedure Evaluation (Addendum)
Anesthesia Evaluation  Patient identified by MRN, date of birth, ID band Patient awake    Reviewed: Allergy & Precautions, NPO status , Patient's Chart, lab work & pertinent test results  Airway Mallampati: I       Dental  (+) Edentulous Upper, Edentulous Lower   Pulmonary former smoker,    Pulmonary exam normal breath sounds clear to auscultation       Cardiovascular hypertension, Pt. on home beta blockers Normal cardiovascular exam+ dysrhythmias Atrial Fibrillation  Rhythm:Regular Rate:Normal     Neuro/Psych PSYCHIATRIC DISORDERS Anxiety    GI/Hepatic Neg liver ROS, GERD  Medicated,  Endo/Other  negative endocrine ROS  Renal/GU   negative genitourinary   Musculoskeletal   Abdominal Normal abdominal exam  (+)   Peds negative pediatric ROS (+)  Hematology negative hematology ROS (+)   Anesthesia Other Findings Bailey Bishop  ECHO COMPLETE WO IMAGING ENHANCING AGENT  Order# [id number]  Reading physician: Arnoldo Lenis, MD Ordering physician: Karmen Bongo, MD Study date: 09/24/16 Study Result   Result status: Final result                     *[address] [place], [place] [address]                            [phone]  ------------------------------------------------------------------- Transthoracic Echocardiography  Patient:    Bailey Bishop, Bailey Bishop MR #:       [id number] Study Date: 09/24/2016 Gender:     F Age:        77 Height:     162.6 cm Weight:     68.4 kg BSA:        1.77 m^2 Pt. Status: Room:       IC07   ATTENDING    Rexene Alberts [id number]  ADMITTING    [place]aren [place], Alwyn Ren  PERFORMING   [facility], Forestine Na  SONOGRAPHER  Alvino Chapel, RCS  cc:  ------------------------------------------------------------------- LV EF: 55% -    60%  ------------------------------------------------------------------- Indications:      Atrial fibrillation - 427.31.  ------------------------------------------------------------------- History:   PMH:  GERD  Risk factors:  Hypertension.  ------------------------------------------------------------------- Study Conclusions  - Left ventricle: The cavity size was normal. Wall thickness was   normal. Systolic function was normal. The estimated ejection   fraction was in the range of 55% to 60%. Wall motion was normal;   there were no regional wall motion abnormalities. - Aortic valve: Mildly to moderately calcified annulus. Mildly   thickened leaflets. Valve area (VTI): 2.92 cm^2. Valve area   (Vmax): 2.73 cm^2. Valve area (Vmean): 2.61 cm^2. - Mitral valve: There was moderate regurgitation. - Left atrium: The atrium was severely dilated. - Right atrium: The atrium was severely dilated. - Atrial septum: No defect or patent foramen ovale was identified. - Pulmonary arteries: Systolic pressure was moderately increased.   PA peak pressure: 49 mm Hg (S). - Pericardium, extracardiac: There is a small circumferential   pericardial effusion. - Technically adequate study.  ------------------------------------------------------------------- Study data:  No prior study was available  for comparison.  Study status:  Routine.  Procedure:  The patient reported no pain pre or post test. Transthoracic echocardiography. Image quality was adequate.  Study completion:  There were no complications. Transthoracic echocardiography.  M-mode, complete 2D, spectral Doppler, and color Doppler.  Birthdate:  Patient birthdate: 09-Jul-1940.  Age:  Patient is 77 yr old.  Sex:  Gender: female. BMI: 25.9 kg/m^2.  Blood pressure:     111/72  Patient status: Inpatient.  Study date:  Study date: 09/24/2016. Study time: 01:32 PM.  Location:   ICU/CCU  -------------------------------------------------------------------  ------------------------------------------------------------------- Left ventricle:  The cavity size was normal. Wall thickness was normal. Systolic function was normal. The estimated ejection fraction was in the range of 55% to 60%. Wall motion was normal; there were no regional wall motion abnormalities. The study was not technically sufficient to allow evaluation of LV diastolic dysfunction due to atrial fibrillation.  ------------------------------------------------------------------- Aortic valve:   Mildly to moderately calcified annulus. Mildly thickened leaflets. Uncertain number of leaflets.  Doppler:   There was no stenosis.   There was no significant regurgitation.    VTI ratio of LVOT to aortic valve: 0.93. Valve area (VTI): 2.92 cm^2. Indexed valve area (VTI): 1.65 cm^2/m^2. Peak velocity ratio of LVOT to aortic valve: 0.87. Valve area (Vmax): 2.73 cm^2. Indexed valve area (Vmax): 1.54 cm^2/m^2. Mean velocity ratio of LVOT to aortic valve: 0.83. Valve area (Vmean): 2.61 cm^2. Indexed valve area (Vmean): 1.47 cm^2/m^2.    Mean gradient (S): 4 mm Hg. Peak gradient (S): 6 mm Hg.  ------------------------------------------------------------------- Aorta:  Aortic root: The aortic root was normal in size.  ------------------------------------------------------------------- Mitral valve:   Mildly thickened leaflets .  Doppler:   There was no evidence for stenosis.   There was moderate regurgitation. MR VC is 0.4 cm.    Peak gradient (D): 5 mm Hg.  ------------------------------------------------------------------- Left atrium:  The atrium was severely dilated.  ------------------------------------------------------------------- Atrial septum:  No defect or patent foramen ovale was identified.   ------------------------------------------------------------------- Right ventricle:  The cavity  size was normal. Systolic function was normal.  ------------------------------------------------------------------- Pulmonic valve:   Not well visualized.  Doppler:   There was no evidence for stenosis.   There was mild regurgitation.  ------------------------------------------------------------------- Tricuspid valve:   Normal thickness leaflets.  Doppler:   There was no evidence for stenosis.   There was mild regurgitation.  ------------------------------------------------------------------- Pulmonary artery:   Systolic pressure was moderately increased.  ------------------------------------------------------------------- Right atrium:  The atrium was severely dilated.  ------------------------------------------------------------------- Pericardium:  There is a small circumferential pericardial effusion. There is no evidence of tamponade physiology.  ------------------------------------------------------------------- Systemic veins: Inferior vena cava: The vessel was dilated. The respirophasic diameter changes were blunted (< 50%), consistent with elevated central venous pressure.  ------------------------------------------------------------------- Measurements   Left ventricle                           Value           Reference  LV ID, ED, PLAX chordal                  50.3   mm       43 - 52  LV ID, ES, PLAX chordal                  35     mm       23 - 38  LV fx shortening, PLAX chordal  30     %        >=29  LV PW thickness, ED                      9.47   mm       ---------  IVS/LV PW ratio, ED                      1.02            <=1.3    Ventricular septum                       Value           Reference  IVS thickness, ED                        9.64   mm       ---------    LVOT                                     Value           Reference  LVOT ID, S                               20     mm       ---------  LVOT area                                 3.14   cm^2     ---------  LVOT peak velocity, S                    105    cm/s     ---------  LVOT mean velocity, S                    76.6   cm/s     ---------  LVOT VTI, S                              18.5   cm       ---------  LVOT peak gradient, S                    4      mm Hg    ---------    Aortic valve                             Value           Reference  Aortic valve peak velocity, S            120.32 cm/s     ---------  Aortic valve mean velocity, S            92.22  cm/s     ---------  Aortic valve VTI, S                      19.81  cm       ---------  Aortic mean gradient, S  4      mm Hg    ---------  Aortic peak gradient, S                  6      mm Hg    ---------  VTI ratio, LVOT/AV                       0.93            ---------  Aortic valve area, VTI                   2.92   cm^2     ---------  Aortic valve area/bsa, VTI               1.65   cm^2/m^2 ---------  Velocity ratio, peak, LVOT/AV            0.87            ---------  Aortic valve area, peak velocity         2.73   cm^2     ---------  Aortic valve area/bsa, peak              1.54   cm^2/m^2 ---------  velocity  Velocity ratio, mean, LVOT/AV            0.83            ---------  Aortic valve area, mean velocity         2.61   cm^2     ---------  Aortic valve area/bsa, mean              1.47   cm^2/m^2 ---------  velocity    Aorta                                    Value           Reference  Aortic root ID, ED                       35     mm       ---------    Left atrium                              Value           Reference  LA ID, A-P, ES                           44     mm       ---------  LA volume/bsa, S                         45.1   ml/m^2   ---------    Mitral valve                             Value           Reference  Mitral peak gradient, D                  5      mm Hg    ---------  Mitral maximal regurg velocity,  522    cm/s     ---------  PISA  Mitral regurg  VTI, PISA                  146    cm       ---------  Mitral ERO, PISA                         0.06   cm^2     ---------  Mitral regurg volume, PISA               9      ml       ---------    Pulmonary arteries                       Value           Reference  PA pressure, S, DP               (H)     49     mm Hg    <=30    Right ventricle                          Value           Reference  TAPSE                                    17     mm       ---------  Legend: (L)  and  (H)  mark values outside specified reference range.  ------------------------------------------------------------------- Prepared and Electronically Authenticated by  Kerry Hough, M.D. 2018-07-02T14:52:58 MERGE Images   Show images for ECHOCARDIOGRAM COMPLETE Patient Information   Patient Name Bailey Bishop, Bailey Bishop Sex Female DOB 1940/05/04 SSN [id number] Reason for Exam  Priority: Routine  Comments:  Surgical History   Surgical History    No past medical history on file.  Other Surgical History    Procedure Laterality Date Comment Source CARDIOVERSION N/A 02/18/2017 Procedure: CARDIOVERSION; Surgeon: Josue Hector, MD; Location[facility] END[facility]COPY; Service: Cardiovascular; Laterality: N/A; Provider CATARACT EXTRACTION W/PHACO  07/23/2011 Procedure: CATARACT EXTRACTION PHACO AND INTRAOCULAR LENS PLACEMENT ([place]); Surgeon: Williams Che, MD; Location: [facility] ORS; Service: Ophthalmology; Laterality: Right; CDE:9.96 Provider CHOLECYSTECTOMY N/A 02/14/2015 Procedure: LAPAROSCOPIC CHOLECYSTECTOMY; Surgeon: Aviva Signs, MD; Location: [facility] ORS; Service: General; Laterality: N/A; Provider MASTECTOMY   bilateral mastectomy-right breast cancer-left taken by choice Provider SEPTOPLASTY  1995  Provider  Patient Data   Height  64 in  BP  111/72 mmHg    Performing Technologist/Nurse   Performing Technologist/Nurse: Samuel Germany          Implants    No active implants to  display in this view. Order-Level Documents - 09/23/2016:   Scan on 09/24/2016 11:01 AM by Default, Provider, MDScan on 09/24/2016 11:01 AM by Default, Provider, MD    Encounter-Level Documents - 09/23/2016:   Document on 09/27/2016 5:22 PM by Juliane Poot, RN: ED PB Summary  Document on 09/27/2016 5:22 PM by Juliane Poot, RN: ED Encounter Summary  Scan on 09/27/2016 10:44 AM by Default, Provider, [place] on 09/27/2016 10:44 AM by Default, Provider, MD  Scan on 09/27/2016 10:35 AM by Default, Provider, MDScan on 09/27/2016 10:35 AM by Default, Provider, MD  Document on 09/26/2016 2:47 PM by Vernell Barrier,  Fraser Din, RN: IP After Visit Summary  Scan on 09/26/2016 9:04 AM by Default, Provider, MDScan on 09/26/2016 9:04 AM by Default, Provider, MD  Scan on 09/25/2016 7:04 PM by Default, Provider, MDScan on 09/25/2016 7:04 PM by Default, Provider, MD  Electronic signature on 09/23/2016 3:01 PM - Signed  Electronic signature on 09/23/2016 3:00 PM - Signed    Signed   Electronically signed by Arnoldo Lenis, MD on 09/24/16 at 1453 EDT Printable Result Report    Result Report  External Result Report    External Result Report     Reproductive/Obstetrics                            Anesthesia Physical Anesthesia Plan  ASA: II  Anesthesia Plan: General   Post-op Pain Management:    Induction:   PONV Risk Score and Plan: 3  Airway Management Planned: Natural Airway and Mask  Additional Equipment:   Intra-op Plan:   Post-operative Plan:   Informed Consent: I have reviewed the patients History and Physical, chart, labs and discussed the procedure including the risks, benefits and alternatives for the proposed anesthesia with the patient or authorized representative who has indicated his/her understanding and acceptance.     Plan Discussed with:   Anesthesia Plan Comments:         Anesthesia Quick Evaluation

## 2017-12-04 NOTE — CV Procedure (Signed)
Electrical Cardioversion Procedure Note Bailey Bishop [id number] 1940/07/24  Procedure: Electrical Cardioversion Indications:  Atrial Fibrillation  Procedure Details Consent: Risks of procedure as well as the alternatives and risks of each were explained to the (patient/caregiver).  Consent for procedure obtained. Time Out: Verified patient identification, verified procedure, site/side was marked, verified correct patient position, special equipment/implants available, medications/allergies/relevent history reviewed, required imaging and test results available.  Performed  Patient placed on cardiac monitor, pulse oximetry, supplemental oxygen as necessary.  Sedation given: propofol Pacer pads placed anterior and posterior chest.  Cardioverted 3 time(s).  Cardioverted at 150J, 200J and 200J.  Evaluation Findings: Post procedure EKG shows: atrial fibrillation with brief episodes of sinus rhythm Complications: None Patient did tolerate procedure well.   Bailey Latch, MD 12/04/2017, 3:13 PM

## 2017-12-04 NOTE — Discharge Summary (Addendum)
ELECTROPHYSIOLOGY PROCEDURE DISCHARGE SUMMARY    [name],  MRN: [id number], DOB/AGE: May 24, 1940 77 y.o.  Admit date: 12/02/2017 Discharge date: 12/05/17  Primary Care Physician: Sharilyn Sites, MD  Primary Cardiologist: [name] Electrophysiologist: [name]  Primary Discharge Diagnosis:  1.  Persistent atrial fibrillation status post Tikosyn loading this admission      CHA2DSVasc is 4, on warfarin, managed with coumadin clinic  Secondary Discharge Diagnosis:  1. HTN  Allergies  Allergen Reactions  . Cardizem Cd [Diltiazem Hcl Er Beads] Diarrhea  . Amiodarone Nausea And Vomiting  . Metoprolol Tartrate Diarrhea  . Sulfa Antibiotics Nausea And Vomiting  . Tomato Other (See Comments)    Burns stomach  . Carvedilol Rash  . Chocolate Rash    Dark chocolate     Procedures This Admission:  1.  Tikosyn loading 2.  Direct current cardioversion on 12/04/17 by Dr Oval Linsey which successfully restored SR.  There were no early apparent complications.   Brief HPI: Bailey Bishop is a 77 y.o. female with a past medical history as noted above.  She is followed by  EP in the outpatient setting for treatment options of atrial fibrillation.  Risks, benefits, and alternatives to Tikosyn were reviewed with the patient who wished to proceed.    Hospital Course:  The patient was admitted and Tikosyn was initiated.  Renal function and electrolytes were followed during the hospitalization.  Her QTc remained stable.  On 12/04/17 the patient underwent direct current cardioversion which restored sinus rhythm though she continues to have brief bouts of PAFib. She was monitored until discharge on telemetry which demonstrated mostly SR/SB with short periods of PAF, occ PVCs, infrequent 2-3beat NSVT.  On the day of discharge, the patient feels well, denies CP, palpitations or SOB, she was examined by Dr Lovena Le who considered her stable for discharge to home.  Follow-up has  been arranged with the Afib clinic in 1 week and with Dr Bronson Ing in 4 weeks.   Physical Exam: Vitals:   12/04/17 1525 12/04/17 2112 12/05/17 0500 12/05/17 0800  BP: 133/70 110/69 (!) 125/92 127/77  Pulse: 63 71 (!) 57 64  Resp: (!) 27     Temp:  98.7 F (37.1 C) 98 F (36.7 C)   TempSrc:  Oral Oral   SpO2: 97% 97% 99%   Weight:   60.6 kg   Height:        GEN- The patient is well appearing, alert and oriented x 3 today.   HEENT: normocephalic, atraumatic; sclera clear, conjunctiva pink; hearing intact; oropharynx clear; neck supple, no JVP Lymph- no cervical lymphadenopathy Lungs- CTA b/l, normal work of breathing.  No wheezes, rales, rhonchi Heart- RRR, no murmurs, rubs or gallops, PMI not laterally displaced GI- soft, non-tender, non-distended Extremities- no clubbing, cyanosis, or edema MS- no significant deformity or atrophy Skin- warm and dry, no rash or lesion Psych- euthymic mood, full affect Neuro- strength and sensation are intact   Labs:   Lab Results  Component Value Date   WBC 7.1 08/29/2017   HGB 11.4 (L) 08/29/2017   HCT 36.0 08/29/2017   MCV 95.2 08/29/2017   PLT 203 08/29/2017    Recent Labs  Lab 12/05/17 0451  NA 136  K 3.8  CL 100  CO2 26  BUN 19  CREATININE 1.54*  CALCIUM 9.5  GLUCOSE 113*     Discharge Medications:  Allergies as of 12/05/2017      Reactions  Cardizem Cd [diltiazem Hcl Er Beads] Diarrhea   Amiodarone Nausea And Vomiting   Metoprolol Tartrate Diarrhea   Sulfa Antibiotics Nausea And Vomiting   Tomato Other (See Comments)   Burns stomach   Carvedilol Rash   Chocolate Rash   Dark chocolate      Medication List    TAKE these medications   ALPRAZolam 0.5 MG tablet Commonly known as:  XANAX Take 0.5 mg by mouth at bedtime.   BREO ELLIPTA 100-25 MCG/INH Aepb Generic drug:  fluticasone furoate-vilanterol Inhale 1 puff into the lungs daily as needed (breathing.).   dofetilide 125 MCG capsule Commonly known as:   TIKOSYN Take 1 capsule (125 mcg total) by mouth 2 (two) times daily.   fluticasone 50 MCG/ACT nasal spray Commonly known as:  FLONASE Place 2 sprays into both nostrils daily as needed for allergies.   furosemide 40 MG tablet Commonly known as:  LASIX Take 1 tablet (40 mg total) by mouth daily.   metoprolol tartrate 50 MG tablet Commonly known as:  LOPRESSOR Take 25 mg by mouth 2 (two) times daily.   omeprazole 20 MG capsule Commonly known as:  PRILOSEC Take 20 mg daily as needed by mouth (indigestion).   potassium chloride SA 20 MEQ tablet Commonly known as:  K-DUR,KLOR-CON Take 1 tablet (20 mEq total) by mouth once. What changed:  when to take this   warfarin 2.5 MG tablet Commonly known as:  COUMADIN Take as directed. If you are unsure how to take this medication, talk to your nurse or doctor. Original instructions:  TAKE ONE TABLET (2.5MG  TOTAL) ONCE DAILYEXCEPT SUNDAYS TAKE TWO TABLETS (5MG  TOTAL) What changed:  See the new instructions.       Disposition:  Home Discharge Instructions    Diet - low sodium heart healthy   Complete by:  As directed    Increase activity slowly   Complete by:  As directed      Follow-up Information    [facility] Follow up on 12/11/2017.   Specialty:  Cardiology Why:  2:00PM, coumadin clinic Contact information: [place]       [facility] Follow up on 12/12/2017.   Specialty:  Cardiology Why:  11:00AM Contact information: [address] [id number] [place] [phone]       Herminio Commons, MD Follow up on 12/25/2017.   Specialty:  Cardiology Why:  10:40AM Contact information: [place] [address] [phone]        Evans Lance, MD Follow up on 03/06/2018.   Specialty:  Cardiology Why:  10:30AM Contact information: [place]  [address] [phone]           Duration of Discharge Encounter: Greater than 30 minutes including physician time.  Venetia Night, PA-C 12/05/2017 12:50 PM  EP Attending  Patient seen and examined. Agree with above. The patient is stable for DC home. Usual followup. Hopefully she will maintain NSR.  Mikle Bosworth.D.

## 2017-12-05 LAB — BASIC METABOLIC PANEL
ANION GAP: 10 (ref 5–15)
BUN: 19 mg/dL (ref 8–23)
CALCIUM: 9.5 mg/dL (ref 8.9–10.3)
CO2: 26 mmol/L (ref 22–32)
Chloride: 100 mmol/L (ref 98–111)
Creatinine, Ser: 1.54 mg/dL — ABNORMAL HIGH (ref 0.44–1.00)
GFR calc Af Amer: 36 mL/min — ABNORMAL LOW (ref >60)
GFR, EST NON AFRICAN AMERICAN: 31 mL/min — AB (ref >60)
GLUCOSE: 113 mg/dL — AB (ref 70–99)
Potassium: 3.8 mmol/L (ref 3.5–5.1)
SODIUM: 136 mmol/L (ref 135–145)

## 2017-12-05 LAB — PROTIME-INR
INR: 2.62
PROTHROMBIN TIME: 27.8 s — AB (ref 11.4–15.2)

## 2017-12-05 LAB — MAGNESIUM: MAGNESIUM: 2.1 mg/dL (ref 1.7–2.4)

## 2017-12-05 MED ORDER — POTASSIUM CHLORIDE CRYS ER 20 MEQ PO TBCR
30.0000 meq | EXTENDED_RELEASE_TABLET | Freq: Once | ORAL | Status: AC
  Administered 2017-12-05: 30 meq via ORAL
  Filled 2017-12-05: qty 1

## 2017-12-05 MED ORDER — DOFETILIDE 125 MCG PO CAPS: 125.0000 ug | ORAL_CAPSULE | Freq: Two times a day (BID) | ORAL | 6 refills | Status: DC

## 2017-12-05 MED ORDER — DOFETILIDE 125 MCG PO CAPS
125.0000 ug | ORAL_CAPSULE | Freq: Two times a day (BID) | ORAL | Status: DC
  Filled 2017-12-05: qty 14

## 2017-12-05 NOTE — Progress Notes (Signed)
Telemetry reviewed, mostly SR s/p DCCV yesterday, frequent PACs, some brief PAFib, occ PVCs, 2-3beat NSVT (monorphic) reviewed with [name] Planned for 6th dose Tikosyn this AM and anticipate discharge this afternoon. EKG reviewed with [name], QTc is OK  Bailey Bishop, PAC

## 2017-12-05 NOTE — Care Management Important Message (Signed)
Important Message  Patient Details  Name: Bailey Bishop MRN: [id number] Date of Birth: [birth year]   Medicare Important Message Given:  Yes    Bailey Bishop [place] 12/05/2017, 1:24 PM

## 2017-12-11 ENCOUNTER — Ambulatory Visit (INDEPENDENT_AMBULATORY_CARE_PROVIDER_SITE_OTHER): Payer: Medicare Other | Admitting: *Deleted

## 2017-12-11 DIAGNOSIS — Z7901 Long term (current) use of anticoagulants: Secondary | ICD-10-CM

## 2017-12-11 DIAGNOSIS — I4891 Unspecified atrial fibrillation: Secondary | ICD-10-CM | POA: Diagnosis not present

## 2017-12-11 LAB — POCT INR: INR: 2.9 (ref 2.0–3.0)

## 2017-12-11 NOTE — Patient Instructions (Signed)
S/P Tikosyn admission. (1st post DCCV)   Continue coumadin 1 tablet daily except 2 tablets on Sundays Recheck 1 week

## 2017-12-12 ENCOUNTER — Encounter (HOSPITAL_COMMUNITY): Payer: Self-pay | Admitting: Nurse Practitioner

## 2017-12-12 ENCOUNTER — Ambulatory Visit (HOSPITAL_COMMUNITY)
Admit: 2017-12-12 | Discharge: 2017-12-12 | Disposition: A | Discharge status: Home / Self Care | Payer: Medicare Other | Source: Ambulatory Visit | Attending: Nurse Practitioner | Admitting: Nurse Practitioner

## 2017-12-12 ENCOUNTER — Telehealth (HOSPITAL_COMMUNITY): Payer: Self-pay | Admitting: *Deleted

## 2017-12-12 VITALS — BP 110/64 | HR 87 | Ht 64.0 in | Wt 137.6 lb

## 2017-12-12 DIAGNOSIS — Z888 Allergy status to other drugs, medicaments and biological substances status: Secondary | ICD-10-CM | POA: Insufficient documentation

## 2017-12-12 DIAGNOSIS — I10 Essential (primary) hypertension: Secondary | ICD-10-CM | POA: Insufficient documentation

## 2017-12-12 DIAGNOSIS — Z853 Personal history of malignant neoplasm of breast: Secondary | ICD-10-CM | POA: Diagnosis not present

## 2017-12-12 DIAGNOSIS — Z9013 Acquired absence of bilateral breasts and nipples: Secondary | ICD-10-CM | POA: Diagnosis not present

## 2017-12-12 DIAGNOSIS — I4892 Unspecified atrial flutter: Secondary | ICD-10-CM | POA: Insufficient documentation

## 2017-12-12 DIAGNOSIS — Z9841 Cataract extraction status, right eye: Secondary | ICD-10-CM | POA: Diagnosis not present

## 2017-12-12 DIAGNOSIS — Z87891 Personal history of nicotine dependence: Secondary | ICD-10-CM | POA: Diagnosis not present

## 2017-12-12 DIAGNOSIS — Z79899 Other long term (current) drug therapy: Secondary | ICD-10-CM | POA: Diagnosis not present

## 2017-12-12 DIAGNOSIS — K219 Gastro-esophageal reflux disease without esophagitis: Secondary | ICD-10-CM | POA: Insufficient documentation

## 2017-12-12 DIAGNOSIS — I481 Persistent atrial fibrillation: Secondary | ICD-10-CM | POA: Diagnosis not present

## 2017-12-12 DIAGNOSIS — Z7901 Long term (current) use of anticoagulants: Secondary | ICD-10-CM | POA: Diagnosis not present

## 2017-12-12 DIAGNOSIS — Z91018 Allergy to other foods: Secondary | ICD-10-CM | POA: Diagnosis not present

## 2017-12-12 DIAGNOSIS — Z961 Presence of intraocular lens: Secondary | ICD-10-CM | POA: Diagnosis not present

## 2017-12-12 DIAGNOSIS — Z9049 Acquired absence of other specified parts of digestive tract: Secondary | ICD-10-CM | POA: Insufficient documentation

## 2017-12-12 DIAGNOSIS — Z882 Allergy status to sulfonamides status: Secondary | ICD-10-CM | POA: Insufficient documentation

## 2017-12-12 DIAGNOSIS — F419 Anxiety disorder, unspecified: Secondary | ICD-10-CM | POA: Diagnosis not present

## 2017-12-12 DIAGNOSIS — Z23 Encounter for immunization: Secondary | ICD-10-CM | POA: Diagnosis not present

## 2017-12-12 DIAGNOSIS — I484 Atypical atrial flutter: Secondary | ICD-10-CM

## 2017-12-12 DIAGNOSIS — J45909 Unspecified asthma, uncomplicated: Secondary | ICD-10-CM | POA: Diagnosis not present

## 2017-12-12 DIAGNOSIS — M1990 Unspecified osteoarthritis, unspecified site: Secondary | ICD-10-CM | POA: Insufficient documentation

## 2017-12-12 LAB — MAGNESIUM: Magnesium: 2.1 mg/dL (ref 1.7–2.4)

## 2017-12-12 LAB — BASIC METABOLIC PANEL
ANION GAP: 10 (ref 5–15)
BUN: 17 mg/dL (ref 8–23)
CALCIUM: 8.9 mg/dL (ref 8.9–10.3)
CO2: 26 mmol/L (ref 22–32)
CREATININE: 1.27 mg/dL — AB (ref 0.44–1.00)
Chloride: 104 mmol/L (ref 98–111)
GFR, EST AFRICAN AMERICAN: 45 mL/min — AB (ref >60)
GFR, EST NON AFRICAN AMERICAN: 39 mL/min — AB (ref >60)
GLUCOSE: 96 mg/dL (ref 70–99)
Potassium: 3.8 mmol/L (ref 3.5–5.1)
Sodium: 140 mmol/L (ref 135–145)

## 2017-12-12 LAB — CBC
HCT: 36.7 % (ref 36.0–46.0)
HEMOGLOBIN: 11.5 g/dL — AB (ref 12.0–15.0)
MCH: 30.3 pg (ref 26.0–34.0)
MCHC: 31.3 g/dL (ref 30.0–36.0)
MCV: 96.6 fL (ref 78.0–100.0)
PLATELETS: 256 10*3/uL (ref 150–400)
RBC: 3.8 MIL/uL — ABNORMAL LOW (ref 3.87–5.11)
RDW: 15.2 % (ref 11.5–15.5)
WBC: 5.7 10*3/uL (ref 4.0–10.5)

## 2017-12-12 NOTE — Progress Notes (Signed)
Primary Care Physician: Sharilyn Sites, MD Referring Physician: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 female with a h/o persistent afib, failing amiodarone for N/V last fall, did not tolerate Cardizem for dairrhea and higher BB dose caused shortness of breath. She had opted for rate control at that time.  She was recently evaluated by [name] for rhythm  control and pt is in the afib clinic today to be admitted for Tikosyn. She meets financial assistance and is applying to to get drug free. She has had therapeutic INR's x 4 weeks, as per reporting in Epic. She denies any benadryl use.  F/u Tikosyn hospitalization 9/19.  During hospitlization, QTc remained stable. Renal function only allowed for 125 mcg of Tikosyn bid.  On 12/04/17 the patient underwent direct current cardioversion which restored sinus rhythm though she continued to have brief bouts of PAFib. She was monitored until discharge on telemetry which demonstrated mostly SR/SB with short periods of PAF, occ PVCs, infrequent 2-3 beats. Pt reports that she feels much better. Unfortunately, she is in atrial flutter with controlled v rates today.  Today, she denies symptoms of palpitations, chest pain, shortness of breath, orthopnea, PND, lower extremity edema, dizziness, presyncope, syncope, or neurologic sequela. The patient is tolerating medications without difficulties and is otherwise without complaint today.   Past Medical History:  Diagnosis Date  . Anxiety   . Arthritis   . Asthma   . Atrial fibrillation ([place])    a. s/p unsuccessful DCCV in 01/2017 and intolerant to Amiodarone --> Rate-control strategy pursued since  . Cancer [facility]) 2001   right breast  . Dyspnea   . GERD (gastroesophageal reflux disease)   . Hypertension    Past Surgical History:  Procedure Laterality Date  . CARDIOVERSION N/A 02/18/2017   Procedure: CARDIOVERSION;  Surgeon: Josue Hector, MD;  Location: [name] ENDOSCOPY;  Service:  Cardiovascular;  Laterality: N/A;  . CARDIOVERSION N/A 12/04/2017   Procedure: CARDIOVERSION;  Surgeon: [name], MD;  Location: [place];  Service: Cardiovascular;  Laterality: N/A;  . CATARACT EXTRACTION W/PHACO  07/23/2011   Procedure: CATARACT EXTRACTION PHACO AND INTRAOCULAR LENS PLACEMENT ([place]);  Surgeon: Williams Che, MD;  Location: [facility];  Service: Ophthalmology;  Laterality: Right;  CDE:9.96  . CHOLECYSTECTOMY N/A 02/14/2015   Procedure: LAPAROSCOPIC CHOLECYSTECTOMY;  Surgeon: Aviva Signs, MD;  Location: [facility];  Service: General;  Laterality: N/A;  . MASTECTOMY     bilateral mastectomy-right breast cancer-left taken by choice  . SEPTOPLASTY  1995    Current Outpatient Medications  Medication Sig Dispense Refill  . ALPRAZolam (XANAX) 0.5 MG tablet Take 0.5 mg by mouth at bedtime.     Marland Kitchen BREO ELLIPTA 100-25 MCG/INH AEPB Inhale 1 puff into the lungs daily as needed (breathing.).     Marland Kitchen dofetilide (TIKOSYN) 125 MCG capsule Take 1 capsule (125 mcg total) by mouth 2 (two) times daily. 60 capsule 6  . fluticasone (FLONASE) 50 MCG/ACT nasal spray Place 2 sprays into both nostrils daily as needed for allergies.     . furosemide (LASIX) 40 MG tablet Take 1 tablet (40 mg total) by mouth daily. 30 tablet 6  . metoprolol tartrate (LOPRESSOR) 50 MG tablet Take 25 mg by mouth 2 (two) times daily.    Marland Kitchen omeprazole (PRILOSEC) 20 MG capsule Take 20 mg daily as needed by mouth (indigestion).     . potassium chloride SA (K-DUR,KLOR-CON) 20 MEQ tablet Take 1 tablet (20 mEq total)  by mouth once. (Patient taking differently: Take 20 mEq daily by mouth. ) 90 tablet 3  . warfarin (COUMADIN) 2.5 MG tablet TAKE ONE TABLET (2.5MG  TOTAL) ONCE DAILYEXCEPT SUNDAYS TAKE TWO TABLETS (5MG  TOTAL) (Patient taking differently: Take 2.5 mg by mouth See admin instructions. 5 mg on Sunday, all other days 2.5 mg) 96 tablet 0   No current facility-administered medications for this encounter.     Allergies    Allergen Reactions  . Cardizem Cd [Diltiazem Hcl Er Beads] Diarrhea  . Amiodarone Nausea And Vomiting  . Metoprolol Tartrate Diarrhea  . Sulfa Antibiotics Nausea And Vomiting  . Tomato Other (See Comments)    Burns stomach  . Carvedilol Rash  . Chocolate Rash    Dark chocolate    Social History   Socioeconomic History  . Marital status: Married    Spouse name: Not on file  . Number of children: Not on file  . Years of education: Not on file  . Highest education level: Not on file  Occupational History  . Not on file  Social Needs  . Financial resource strain: Not on file  . Food insecurity:    Worry: Not on file    Inability: Not on file  . Transportation needs:    Medical: Not on file    Non-medical: Not on file  Tobacco Use  . Smoking status: Former Smoker    Packs/day: 1.00    Years: 28.00    Pack years: 28.00    Types: Cigarettes    Last attempt to quit: 07/17/1983    Years since quitting: 34.4  . Smokeless tobacco: Never Used  Substance and Sexual Activity  . Alcohol use: No  . Drug use: No  . Sexual activity: Never    Birth control/protection: Post-menopausal  Lifestyle  . Physical activity:    Days per week: Not on file    Minutes per session: Not on file  . Stress: Not on file  Relationships  . Social connections:    Talks on phone: Not on file    Gets together: Not on file    Attends religious service: Not on file    Active member of club or organization: Not on file    Attends meetings of clubs or organizations: Not on file    Relationship status: Not on file  . Intimate partner violence:    Fear of current or ex partner: Not on file    Emotionally abused: Not on file    Physically abused: Not on file    Forced sexual activity: Not on file  Other Topics Concern  . Not on file  Social History Narrative  . Not on file    Family History  Problem Relation Age of Onset  . Cancer Mother   . Aneurysm Father   . Cancer Sister   . Cancer  Sister   . Anesthesia problems Neg Hx   . Hypotension Neg Hx   . Malignant hyperthermia Neg Hx   . Pseudochol deficiency Neg Hx     ROS- All systems are reviewed and negative except as per the HPI above  Physical Exam: Vitals:   12/12/17 1059  BP: 110/64  Pulse: 87  Weight: 62.4 kg  Height: 5\' 4"  (1.626 m)   Wt Readings from Last 3 Encounters:  12/12/17 62.4 kg  12/05/17 60.6 kg  12/02/17 63 kg    Labs: Lab Results  Component Value Date   NA 140 12/12/2017   K 3.8 12/12/2017  CL 104 12/12/2017   CO2 26 12/12/2017   GLUCOSE 96 12/12/2017   BUN 17 12/12/2017   CREATININE 1.27 (H) 12/12/2017   CALCIUM 8.9 12/12/2017   MG 2.1 12/12/2017   Lab Results  Component Value Date   INR 2.9 12/11/2017   Lab Results  Component Value Date   CHOL 109 09/24/2016   HDL 34 (L) 09/24/2016   LDLCALC 62 09/24/2016   TRIG 66 09/24/2016     GEN- The patient is well appearing, alert and oriented x 3 today.   Head- normocephalic, atraumatic Eyes-  Sclera clear, conjunctiva pink Ears- hearing intact Oropharynx- clear Neck- supple, no JVP Lymph- no cervical lymphadenopathy Lungs- Clear to ausculation bilaterally, normal work of breathing Heart- irregular rate and rhythm, no murmurs, rubs or gallops, PMI not laterally displaced GI- soft, NT, ND, + BS Extremities- no clubbing, cyanosis, or edema MS- no significant deformity or atrophy Skin- no rash or lesion Psych- euthymic mood, full affect Neuro- strength and sensation are intact  EKG- Atrial  flutter at 87 bpm, qrs int 82 ms, qtc 486 ms   Assessment and Plan: 1. Persistent  afib Failed amiodarone last fall, intolerant to Cardizem 2/2 diarrhea and higher doses of BB 2/2 shortness of breath Pt underwent Tikosyn admit but is in atrial flutter today, but feels improved Is taking Tikosyn correctly I may have just caught here in an episode of flutter Will bring back next week and if still out of rhythm, consider another  DCCV, before calling Tikosyn a failure Per [name] is she  fails Tikosyn, she would be an AV nodal ablation/PPM candidate No benadryl use Bmet/mag pending qtc is stable  F/u in one week  Butch Penny C. Marckus Hanover, [facility] [address] [place], [place] [address] [phone]

## 2017-12-12 NOTE — Telephone Encounter (Signed)
Tikosyn assistance approved ID [id number] through 03/25/18

## 2017-12-19 ENCOUNTER — Ambulatory Visit (HOSPITAL_COMMUNITY)
Admission: RE | Admit: 2017-12-19 | Discharge: 2017-12-19 | Disposition: A | Discharge status: Home / Self Care | Payer: Medicare Other | Source: Ambulatory Visit | Attending: Nurse Practitioner | Admitting: Nurse Practitioner

## 2017-12-19 ENCOUNTER — Encounter (HOSPITAL_COMMUNITY): Payer: Self-pay | Admitting: Nurse Practitioner

## 2017-12-19 VITALS — BP 122/74 | HR 95 | Ht 64.0 in | Wt 141.0 lb

## 2017-12-19 DIAGNOSIS — I481 Persistent atrial fibrillation: Secondary | ICD-10-CM | POA: Diagnosis not present

## 2017-12-19 DIAGNOSIS — I4819 Other persistent atrial fibrillation: Secondary | ICD-10-CM

## 2017-12-19 LAB — BASIC METABOLIC PANEL
ANION GAP: 9 (ref 5–15)
BUN: 20 mg/dL (ref 8–23)
CO2: 24 mmol/L (ref 22–32)
Calcium: 8.8 mg/dL — ABNORMAL LOW (ref 8.9–10.3)
Chloride: 107 mmol/L (ref 98–111)
Creatinine, Ser: 1.3 mg/dL — ABNORMAL HIGH (ref 0.44–1.00)
GFR, EST AFRICAN AMERICAN: 44 mL/min — AB (ref >60)
GFR, EST NON AFRICAN AMERICAN: 38 mL/min — AB (ref >60)
GLUCOSE: 103 mg/dL — AB (ref 70–99)
POTASSIUM: 3.4 mmol/L — AB (ref 3.5–5.1)
Sodium: 140 mmol/L (ref 135–145)

## 2017-12-19 LAB — PROTIME-INR
INR: 2.43
Prothrombin Time: 26.2 seconds — ABNORMAL HIGH (ref 11.4–15.2)

## 2017-12-19 NOTE — Progress Notes (Signed)
Pt in for repeat ekg for tikosyn admit. She was out of rhythm last week, but she did not feel she was out all the time. Ekg today shows afib at 95 bpm, qrs int 82 ms, qtc 449 ms She states that she feels so much better, less short of breath. I will place a one week Zio patch and see if she is persistent and if so , plan on one more cardioversion before saying Tikosyn is a failure. She will continue weekly INR's until I see if she is going to have cardioversion. F/u in 2 weeks.

## 2017-12-20 ENCOUNTER — Other Ambulatory Visit (HOSPITAL_COMMUNITY): Payer: Self-pay | Admitting: *Deleted

## 2017-12-20 MED ORDER — POTASSIUM CHLORIDE CRYS ER 20 MEQ PO TBCR: 20.0000 meq | EXTENDED_RELEASE_TABLET | Freq: Two times a day (BID) | ORAL | 2 refills | Status: DC

## 2017-12-25 ENCOUNTER — Ambulatory Visit: Payer: Medicare Other | Admitting: Cardiovascular Disease

## 2017-12-25 ENCOUNTER — Ambulatory Visit (INDEPENDENT_AMBULATORY_CARE_PROVIDER_SITE_OTHER): Payer: Medicare Other | Admitting: *Deleted

## 2017-12-25 DIAGNOSIS — I4891 Unspecified atrial fibrillation: Secondary | ICD-10-CM | POA: Diagnosis not present

## 2017-12-25 DIAGNOSIS — Z7901 Long term (current) use of anticoagulants: Secondary | ICD-10-CM

## 2017-12-25 LAB — POCT INR: INR: 2.6 (ref 2.0–3.0)

## 2017-12-25 NOTE — Patient Instructions (Signed)
S/P Tikosyn admission. (2nd post DCCV)   Continue coumadin 1 tablet daily except 2 tablets on Sundays Recheck 1 week

## 2017-12-30 DIAGNOSIS — I4819 Other persistent atrial fibrillation: Secondary | ICD-10-CM | POA: Diagnosis not present

## 2017-12-30 DIAGNOSIS — I4891 Unspecified atrial fibrillation: Secondary | ICD-10-CM | POA: Diagnosis not present

## 2018-01-01 ENCOUNTER — Ambulatory Visit (INDEPENDENT_AMBULATORY_CARE_PROVIDER_SITE_OTHER): Payer: Medicare Other | Admitting: *Deleted

## 2018-01-01 DIAGNOSIS — I4891 Unspecified atrial fibrillation: Secondary | ICD-10-CM | POA: Diagnosis not present

## 2018-01-01 DIAGNOSIS — Z7901 Long term (current) use of anticoagulants: Secondary | ICD-10-CM

## 2018-01-01 LAB — POCT INR: INR: 2.4 (ref 2.0–3.0)

## 2018-01-01 NOTE — Patient Instructions (Signed)
S/P Tikosyn admission. (3rd post DCCV)   Continue coumadin 1 tablet daily except 2 tablets on Sundays Recheck 1 week

## 2018-01-03 ENCOUNTER — Encounter (HOSPITAL_COMMUNITY): Payer: Self-pay | Admitting: Nurse Practitioner

## 2018-01-03 ENCOUNTER — Encounter (HOSPITAL_COMMUNITY): Payer: Self-pay

## 2018-01-03 ENCOUNTER — Ambulatory Visit (HOSPITAL_COMMUNITY)
Admission: RE | Admit: 2018-01-03 | Discharge: 2018-01-03 | Disposition: A | Discharge status: Home / Self Care | Payer: Medicare Other | Source: Ambulatory Visit | Attending: Nurse Practitioner | Admitting: Nurse Practitioner

## 2018-01-03 VITALS — BP 136/78 | HR 98 | Ht 64.0 in | Wt 141.0 lb

## 2018-01-03 DIAGNOSIS — Z853 Personal history of malignant neoplasm of breast: Secondary | ICD-10-CM | POA: Diagnosis not present

## 2018-01-03 DIAGNOSIS — Z91018 Allergy to other foods: Secondary | ICD-10-CM | POA: Insufficient documentation

## 2018-01-03 DIAGNOSIS — I4819 Other persistent atrial fibrillation: Secondary | ICD-10-CM

## 2018-01-03 DIAGNOSIS — Z882 Allergy status to sulfonamides status: Secondary | ICD-10-CM | POA: Insufficient documentation

## 2018-01-03 DIAGNOSIS — F419 Anxiety disorder, unspecified: Secondary | ICD-10-CM | POA: Insufficient documentation

## 2018-01-03 DIAGNOSIS — J45909 Unspecified asthma, uncomplicated: Secondary | ICD-10-CM | POA: Insufficient documentation

## 2018-01-03 DIAGNOSIS — Z9013 Acquired absence of bilateral breasts and nipples: Secondary | ICD-10-CM | POA: Diagnosis not present

## 2018-01-03 DIAGNOSIS — I10 Essential (primary) hypertension: Secondary | ICD-10-CM | POA: Diagnosis not present

## 2018-01-03 DIAGNOSIS — M1990 Unspecified osteoarthritis, unspecified site: Secondary | ICD-10-CM | POA: Diagnosis not present

## 2018-01-03 DIAGNOSIS — Z87891 Personal history of nicotine dependence: Secondary | ICD-10-CM | POA: Diagnosis not present

## 2018-01-03 DIAGNOSIS — Z7901 Long term (current) use of anticoagulants: Secondary | ICD-10-CM | POA: Diagnosis not present

## 2018-01-03 DIAGNOSIS — K219 Gastro-esophageal reflux disease without esophagitis: Secondary | ICD-10-CM | POA: Insufficient documentation

## 2018-01-03 DIAGNOSIS — Z888 Allergy status to other drugs, medicaments and biological substances status: Secondary | ICD-10-CM | POA: Insufficient documentation

## 2018-01-03 DIAGNOSIS — Z9049 Acquired absence of other specified parts of digestive tract: Secondary | ICD-10-CM | POA: Insufficient documentation

## 2018-01-03 DIAGNOSIS — Z79899 Other long term (current) drug therapy: Secondary | ICD-10-CM | POA: Diagnosis not present

## 2018-01-03 NOTE — Progress Notes (Signed)
Primary Care Physician: Bailey Sites, MD Referring Physician:Dr. 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 female with a h/o persistent afib, failing amiodarone for N/V last fall, did not tolerate Cardizem for dairrhea and higher BB dose caused shortness of breath. She had opted for rate control at that time.  She was recently evaluated by Dr. Lovena Bishop for rhythm  control and pt is in the afib clinic today to be admitted for Tikosyn. She meets financial assistance and is applying to to get drug free. She has had therapeutic INR's x 4 weeks, as per reporting in Epic. She denies any benadryl use.  F/u in afib clinic, 10/11. Pt  recently wore a ZIO patch and unfortunately was in persistent afib. She has failed Tikosyn. Per Dr. Tanna Bishop note prior to Tikosyn admit, if she failed Tikosyn, he would consider her for a PPM/AV nodal ablation.She does feel better since Tikosyn started though, she is better rate controlled..  Today, she denies symptoms of palpitations, chest pain, shortness of breath, orthopnea, PND, lower extremity edema, dizziness, presyncope, syncope, or neurologic sequela. The patient is tolerating medications without difficulties and is otherwise without complaint today.   Past Medical History:  Diagnosis Date  . Anxiety   . Arthritis   . Asthma   . Atrial fibrillation ([place])    a. s/p unsuccessful DCCV in 01/2017 and intolerant to Amiodarone --> Rate-control strategy pursued since  . Cancer [facility]) 2001   right breast  . Dyspnea   . GERD (gastroesophageal reflux disease)   . Hypertension    Past Surgical History:  Procedure Laterality Date  . CARDIOVERSION N/A 02/18/2017   Procedure: CARDIOVERSION;  Surgeon: Bailey Hector, MD;  Location: [name] ENDOSCOPY;  Service: Cardiovascular;  Laterality: N/A;  . CARDIOVERSION N/A 12/04/2017   Procedure: CARDIOVERSION;  Surgeon: Bailey Latch, MD;  Location: [place];  Service: Cardiovascular;  Laterality: N/A;  .  CATARACT EXTRACTION W/PHACO  07/23/2011   Procedure: CATARACT EXTRACTION PHACO AND INTRAOCULAR LENS PLACEMENT ([place]);  Surgeon: Bailey Che, MD;  Location: [facility];  Service: Ophthalmology;  Laterality: Right;  CDE:9.96  . CHOLECYSTECTOMY N/A 02/14/2015   Procedure: LAPAROSCOPIC CHOLECYSTECTOMY;  Surgeon: Bailey Signs, MD;  Location: [facility];  Service: General;  Laterality: N/A;  . MASTECTOMY     bilateral mastectomy-right breast cancer-left taken by choice  . SEPTOPLASTY  1995    Current Outpatient Medications  Medication Sig Dispense Refill  . ALPRAZolam (XANAX) 0.5 MG tablet Take 0.5 mg by mouth at bedtime.     Marland Kitchen BREO ELLIPTA 100-25 MCG/INH AEPB Inhale 1 puff into the lungs daily as needed (breathing.).     Marland Kitchen dofetilide (TIKOSYN) 125 MCG capsule Take 1 capsule (125 mcg total) by mouth 2 (two) times daily. 60 capsule 6  . fluticasone (FLONASE) 50 MCG/ACT nasal spray Place 2 sprays into both nostrils daily as needed for allergies.     . furosemide (LASIX) 40 MG tablet Take 1 tablet (40 mg total) by mouth daily. 30 tablet 6  . loratadine (CLARITIN) 10 MG tablet Take 10 mg by mouth daily.    . metoprolol tartrate (LOPRESSOR) 50 MG tablet Take 25 mg by mouth 2 (two) times daily.    Marland Kitchen omeprazole (PRILOSEC) 20 MG capsule Take 20 mg daily as needed by mouth (indigestion).     . potassium chloride SA (K-DUR,KLOR-CON) 20 MEQ tablet Take 1 tablet (20 mEq total) by mouth 2 (two) times daily. 180 tablet 2  .  warfarin (COUMADIN) 2.5 MG tablet TAKE ONE TABLET (2.5MG  TOTAL) ONCE DAILYEXCEPT SUNDAYS TAKE TWO TABLETS (5MG  TOTAL) (Patient taking differently: Take 2.5 mg by mouth See admin instructions. 5 mg on Sunday, all other days 2.5 mg) 96 tablet 0   No current facility-administered medications for this encounter.     Allergies  Allergen Reactions  . Cardizem Cd [Diltiazem Hcl Er Beads] Diarrhea  . Amiodarone Nausea And Vomiting  . Metoprolol Tartrate Diarrhea  . Sulfa Antibiotics Nausea And  Vomiting  . Tomato Other (See Comments)    Burns stomach  . Carvedilol Rash  . Chocolate Rash    Dark chocolate    Social History   Socioeconomic History  . Marital status: Married    Spouse name: Not on file  . Number of children: Not on file  . Years of education: Not on file  . Highest education level: Not on file  Occupational History  . Not on file  Social Needs  . Financial resource strain: Not on file  . Food insecurity:    Worry: Not on file    Inability: Not on file  . Transportation needs:    Medical: Not on file    Non-medical: Not on file  Tobacco Use  . Smoking status: Former Smoker    Packs/day: 1.00    Years: 28.00    Pack years: 28.00    Types: Cigarettes    Last attempt to quit: 07/17/1983    Years since quitting: 34.4  . Smokeless tobacco: Never Used  Substance and Sexual Activity  . Alcohol use: No  . Drug use: No  . Sexual activity: Never    Birth control/protection: Post-menopausal  Lifestyle  . Physical activity:    Days per week: Not on file    Minutes per session: Not on file  . Stress: Not on file  Relationships  . Social connections:    Talks on phone: Not on file    Gets together: Not on file    Attends religious service: Not on file    Active member of club or organization: Not on file    Attends meetings of clubs or organizations: Not on file    Relationship status: Not on file  . Intimate partner violence:    Fear of current or ex partner: Not on file    Emotionally abused: Not on file    Physically abused: Not on file    Forced sexual activity: Not on file  Other Topics Concern  . Not on file  Social History Narrative  . Not on file    Family History  Problem Relation Age of Onset  . Cancer Mother   . Aneurysm Father   . Cancer Sister   . Cancer Sister   . Anesthesia problems Neg Hx   . Hypotension Neg Hx   . Malignant hyperthermia Neg Hx   . Pseudochol deficiency Neg Hx     ROS- All systems are reviewed and  negative except as per the HPI above  Physical Exam: Vitals:   01/03/18 1041  BP: 136/78  Pulse: 98  Weight: 64 kg  Height: 5\' 4"  (1.626 m)   Wt Readings from Last 3 Encounters:  01/03/18 64 kg  12/19/17 64 kg  12/12/17 62.4 kg    Labs: Lab Results  Component Value Date   NA 140 12/19/2017   K 3.4 (L) 12/19/2017   CL 107 12/19/2017   CO2 24 12/19/2017   GLUCOSE 103 (H) 12/19/2017  BUN 20 12/19/2017   CREATININE 1.30 (H) 12/19/2017   CALCIUM 8.8 (L) 12/19/2017   MG 2.1 12/12/2017   Lab Results  Component Value Date   INR 2.4 01/01/2018   Lab Results  Component Value Date   CHOL 109 09/24/2016   HDL 34 (L) 09/24/2016   LDLCALC 62 09/24/2016   TRIG 66 09/24/2016     GEN- The patient is well appearing, alert and oriented x 3 today.   Head- normocephalic, atraumatic Eyes-  Sclera clear, conjunctiva pink Ears- hearing intact Oropharynx- clear Neck- supple, no JVP Lymph- no cervical lymphadenopathy Lungs- Clear to ausculation bilaterally, normal work of breathing Heart- irregular rate and rhythm, no murmurs, rubs or gallops, PMI not laterally displaced GI- soft, NT, ND, + BS Extremities- no clubbing, cyanosis, or edema MS- no significant deformity or atrophy Skin- no rash or lesion Psych- euthymic mood, full affect Neuro- strength and sensation are intact  EKG- Afib at 98 bpm , qtc 473 ms INR's reviewed which have been therapeutic,starting 8/23, including today at 2.8 Zio patch-1. Atrial fib with a controlled VR and a RVR 2. NSVT 3. No prolonged pauses or bradycardia    Assessment and Plan: 1. Persistent  afib Failed amiodarone last fall, intolerant to Cardizem 2/2 diarrhea and higher doses of BB 2/2 shortness of breath Appears to have failed tikosyn Continue at 125 mcg bid for now, continue BB  Per Dr. Lovena Bishop is she  fails Tikosyn, she would be an AV nodal ablation/PPM candidate I will refer back to Dr. Lovena Bishop to be seen in 2 weeks to further  discuss Continue warfairn   Bailey Bishop, [facility] [address] [place], [place] [address] [phone]

## 2018-01-06 ENCOUNTER — Ambulatory Visit (INDEPENDENT_AMBULATORY_CARE_PROVIDER_SITE_OTHER): Payer: Medicare Other | Admitting: *Deleted

## 2018-01-06 DIAGNOSIS — I4891 Unspecified atrial fibrillation: Secondary | ICD-10-CM

## 2018-01-06 DIAGNOSIS — Z7901 Long term (current) use of anticoagulants: Secondary | ICD-10-CM | POA: Diagnosis not present

## 2018-01-06 LAB — POCT INR: INR: 2.3 (ref 2.0–3.0)

## 2018-01-06 NOTE — Patient Instructions (Signed)
S/P Tikosyn admission. (4th post DCCV)   Waiting to talk to Dr Lovena Le about a pacemaker. Continue coumadin 1 tablet daily except 2 tablets on Sundays Recheck 1 week

## 2018-01-13 ENCOUNTER — Ambulatory Visit (INDEPENDENT_AMBULATORY_CARE_PROVIDER_SITE_OTHER): Payer: Medicare Other | Admitting: *Deleted

## 2018-01-13 DIAGNOSIS — I4891 Unspecified atrial fibrillation: Secondary | ICD-10-CM

## 2018-01-13 DIAGNOSIS — Z7901 Long term (current) use of anticoagulants: Secondary | ICD-10-CM

## 2018-01-13 LAB — POCT INR: INR: 2.3 (ref 2.0–3.0)

## 2018-01-13 NOTE — Patient Instructions (Signed)
S/P Tikosyn admission. (5th post DCCV)   Waiting to talk to Dr Lovena Le about a pacemaker. Continue coumadin 1 tablet daily except 2 tablets on Sundays Recheck 2 weeks

## 2018-01-13 NOTE — Addendum Note (Signed)
Addended by: Malen Gauze on: 01/13/2018 02:54 PM   Modules accepted: Level of Service

## 2018-01-21 ENCOUNTER — Ambulatory Visit (HOSPITAL_COMMUNITY)
Admission: RE | Admit: 2018-01-21 | Discharge: 2018-01-21 | Disposition: A | Discharge status: Home / Self Care | Payer: Medicare Other | Source: Ambulatory Visit | Attending: Physician Assistant | Admitting: Physician Assistant

## 2018-01-21 ENCOUNTER — Other Ambulatory Visit (HOSPITAL_COMMUNITY): Payer: Self-pay | Admitting: Physician Assistant

## 2018-01-21 DIAGNOSIS — X58XXXA Exposure to other specified factors, initial encounter: Secondary | ICD-10-CM | POA: Insufficient documentation

## 2018-01-21 DIAGNOSIS — S53402A Unspecified sprain of left elbow, initial encounter: Secondary | ICD-10-CM | POA: Insufficient documentation

## 2018-01-21 DIAGNOSIS — M25522 Pain in left elbow: Secondary | ICD-10-CM | POA: Diagnosis not present

## 2018-01-21 DIAGNOSIS — S52135A Nondisplaced fracture of neck of left radius, initial encounter for closed fracture: Secondary | ICD-10-CM | POA: Diagnosis not present

## 2018-01-21 DIAGNOSIS — Z6824 Body mass index (BMI) 24.0-24.9, adult: Secondary | ICD-10-CM | POA: Diagnosis not present

## 2018-01-24 ENCOUNTER — Encounter: Payer: Self-pay | Admitting: Orthopedic Surgery

## 2018-01-24 ENCOUNTER — Ambulatory Visit: Payer: Medicare Other | Admitting: Orthopedic Surgery

## 2018-01-24 VITALS — BP 122/74 | HR 85 | Ht 64.0 in | Wt 146.0 lb

## 2018-01-24 DIAGNOSIS — S52125A Nondisplaced fracture of head of left radius, initial encounter for closed fracture: Secondary | ICD-10-CM

## 2018-01-24 NOTE — Progress Notes (Signed)
NEW PATIENT OFFICE VISIT  Chief Complaint  Patient presents with  . Elbow Injury    Left elbow fracture DOI 01/19/2018     77 year old female injured left elbow at home feet became tangled she hit a door.  Presents for evaluation treatment left elbow pain  Date of injury was 27 October quality of pain is dull severity mild associated with painful rotation of the wrist   Review of Systems  Constitutional: Negative for fever.  Skin: Negative for rash.  Neurological: Negative for tingling and tremors.     Past Medical History:  Diagnosis Date  . Anxiety   . Arthritis   . Asthma   . Atrial fibrillation ([place])    a. s/p unsuccessful DCCV in 01/2017 and intolerant to Amiodarone --> Rate-control strategy pursued since  . Cancer [facility]) 2001   right breast  . Dyspnea   . GERD (gastroesophageal reflux disease)   . Hypertension     Past Surgical History:  Procedure Laterality Date  . CARDIOVERSION N/A 02/18/2017   Procedure: CARDIOVERSION;  Surgeon: Josue Hector, MD;  Location: [name] ENDOSCOPY;  Service: Cardiovascular;  Laterality: N/A;  . CARDIOVERSION N/A 12/04/2017   Procedure: CARDIOVERSION;  Surgeon: [name], MD;  Location: [place];  Service: Cardiovascular;  Laterality: N/A;  . CATARACT EXTRACTION W/PHACO  07/23/2011   Procedure: CATARACT EXTRACTION PHACO AND INTRAOCULAR LENS PLACEMENT ([place]);  Surgeon: Williams Che, MD;  Location: [facility];  Service: Ophthalmology;  Laterality: Right;  CDE:9.96  . CHOLECYSTECTOMY N/A 02/14/2015   Procedure: LAPAROSCOPIC CHOLECYSTECTOMY;  Surgeon: Aviva Signs, MD;  Location: [facility];  Service: General;  Laterality: N/A;  . MASTECTOMY     bilateral mastectomy-right breast cancer-left taken by choice  . SEPTOPLASTY  1995    Family History  Problem Relation Age of Onset  . Cancer Mother   . Aneurysm Father   . Cancer Sister   . Cancer Sister   . Anesthesia problems Neg Hx   . Hypotension Neg Hx   . Malignant  hyperthermia Neg Hx   . Pseudochol deficiency Neg Hx    Social History   Tobacco Use  . Smoking status: Former Smoker    Packs/day: 1.00    Years: 28.00    Pack years: 28.00    Types: Cigarettes    Last attempt to quit: 07/17/1983    Years since quitting: 34.5  . Smokeless tobacco: Never Used  Substance Use Topics  . Alcohol use: No  . Drug use: No    Allergies  Allergen Reactions  . Cardizem Cd [Diltiazem Hcl Er Beads] Diarrhea  . Amiodarone Nausea And Vomiting  . Metoprolol Tartrate Diarrhea  . Sulfa Antibiotics Nausea And Vomiting  . Tomato Other (See Comments)    Burns stomach  . Carvedilol Rash  . Chocolate Rash    Dark chocolate    Current Meds  Medication Sig  . ALPRAZolam (XANAX) 0.5 MG tablet Take 0.5 mg by mouth at bedtime.   Marland Kitchen BREO ELLIPTA 100-25 MCG/INH AEPB Inhale 1 puff into the lungs daily as needed (breathing.).   Marland Kitchen fluticasone (FLONASE) 50 MCG/ACT nasal spray Place 2 sprays into both nostrils daily as needed for allergies.   . furosemide (LASIX) 40 MG tablet Take 1 tablet (40 mg total) by mouth daily.  Marland Kitchen loratadine (CLARITIN) 10 MG tablet Take 10 mg by mouth daily.  . metoprolol tartrate (LOPRESSOR) 50 MG tablet Take 25 mg by mouth 2 (two) times daily.  Marland Kitchen omeprazole (PRILOSEC) 20 MG  capsule Take 20 mg daily as needed by mouth (indigestion).   . potassium chloride SA (K-DUR,KLOR-CON) 20 MEQ tablet Take 1 tablet (20 mEq total) by mouth 2 (two) times daily.  Marland Kitchen warfarin (COUMADIN) 2.5 MG tablet TAKE ONE TABLET (2.5MG  TOTAL) ONCE DAILYEXCEPT SUNDAYS TAKE TWO TABLETS (5MG  TOTAL) (Patient taking differently: Take 2.5 mg by mouth See admin instructions. 5 mg on Sunday, all other days 2.5 mg)    BP 122/74   Pulse 85   Ht 5\' 4"  (1.626 m)   Wt 146 lb (66.2 kg)   BMI 25.06 kg/m   Physical Exam  Constitutional: She is oriented to person, place, and time. She appears well-developed and well-nourished.  Neurological: She is alert and oriented to person, place,  and time.  Psychiatric: She has a normal mood and affect. Judgment normal.  Vitals reviewed.   Ortho Exam  Right elbow no tenderness full range of motion no instability muscle tone normal skin is warm dry and intact good pulse normal perfusion normal sensation  Left elbow tender over the radial head full flexion extension mild discomfort painful limited rotation no elbow instability skin is warm dry and intact pulses good lymph nodes are negative sensation is normal  MEDICAL DECISION SECTION  Xrays were done at [name]  My independent reading of xrays:  NONDISPLACED LEFT RADIAL HEAD FRACTURE   Encounter Diagnosis  Name Primary?  . Closed nondisplaced fracture of head of left radius, initial encounter Yes    PLAN: (Rx., injectx, surgery, frx, mri/ct) AROM WITHOUT IMMOBILAZATION   FU 2 MONTHS XRAYS   No orders of the defined types were placed in this encounter.   Arther Abbott, MD  01/24/2018 9:11 AM

## 2018-01-24 NOTE — Patient Instructions (Addendum)
No lifting no twisting of left elbow for 2 weeks

## 2018-01-25 ENCOUNTER — Other Ambulatory Visit: Payer: Self-pay | Admitting: Cardiovascular Disease

## 2018-01-27 ENCOUNTER — Ambulatory Visit (INDEPENDENT_AMBULATORY_CARE_PROVIDER_SITE_OTHER): Payer: Medicare Other | Admitting: *Deleted

## 2018-01-27 DIAGNOSIS — I4891 Unspecified atrial fibrillation: Secondary | ICD-10-CM | POA: Diagnosis not present

## 2018-01-27 DIAGNOSIS — Z7901 Long term (current) use of anticoagulants: Secondary | ICD-10-CM | POA: Diagnosis not present

## 2018-01-27 LAB — POCT INR: INR: 2.8 (ref 2.0–3.0)

## 2018-01-27 NOTE — Patient Instructions (Signed)
S/P Tikosyn admission.    Waiting to talk to Dr Lovena Le about a pacemaker. Continue coumadin 1 tablet daily except 2 tablets on Sundays Recheck 3 weeks

## 2018-01-29 ENCOUNTER — Encounter (HOSPITAL_COMMUNITY): Payer: Self-pay | Admitting: Emergency Medicine

## 2018-01-29 ENCOUNTER — Other Ambulatory Visit: Payer: Self-pay

## 2018-01-29 ENCOUNTER — Ambulatory Visit (INDEPENDENT_AMBULATORY_CARE_PROVIDER_SITE_OTHER): Payer: Medicare Other | Admitting: Internal Medicine

## 2018-01-29 ENCOUNTER — Telehealth: Payer: Self-pay | Admitting: Internal Medicine

## 2018-01-29 ENCOUNTER — Emergency Department (HOSPITAL_COMMUNITY)
Admission: EM | Admit: 2018-01-29 | Discharge: 2018-01-29 | Disposition: A | Discharge status: Home / Self Care | Payer: Medicare Other | Attending: Emergency Medicine | Admitting: Emergency Medicine

## 2018-01-29 ENCOUNTER — Encounter: Payer: Self-pay | Admitting: Internal Medicine

## 2018-01-29 VITALS — BP 124/74 | HR 101 | Ht 64.0 in | Wt 146.0 lb

## 2018-01-29 DIAGNOSIS — Z87891 Personal history of nicotine dependence: Secondary | ICD-10-CM | POA: Insufficient documentation

## 2018-01-29 DIAGNOSIS — M25522 Pain in left elbow: Secondary | ICD-10-CM | POA: Diagnosis not present

## 2018-01-29 DIAGNOSIS — Z853 Personal history of malignant neoplasm of breast: Secondary | ICD-10-CM | POA: Diagnosis not present

## 2018-01-29 DIAGNOSIS — Z7901 Long term (current) use of anticoagulants: Secondary | ICD-10-CM | POA: Insufficient documentation

## 2018-01-29 DIAGNOSIS — N182 Chronic kidney disease, stage 2 (mild): Secondary | ICD-10-CM | POA: Insufficient documentation

## 2018-01-29 DIAGNOSIS — S52125D Nondisplaced fracture of head of left radius, subsequent encounter for closed fracture with routine healing: Secondary | ICD-10-CM | POA: Insufficient documentation

## 2018-01-29 DIAGNOSIS — I4891 Unspecified atrial fibrillation: Secondary | ICD-10-CM

## 2018-01-29 DIAGNOSIS — J449 Chronic obstructive pulmonary disease, unspecified: Secondary | ICD-10-CM | POA: Insufficient documentation

## 2018-01-29 DIAGNOSIS — J45909 Unspecified asthma, uncomplicated: Secondary | ICD-10-CM | POA: Diagnosis not present

## 2018-01-29 DIAGNOSIS — X58XXXD Exposure to other specified factors, subsequent encounter: Secondary | ICD-10-CM | POA: Insufficient documentation

## 2018-01-29 DIAGNOSIS — Z79899 Other long term (current) drug therapy: Secondary | ICD-10-CM | POA: Insufficient documentation

## 2018-01-29 DIAGNOSIS — I129 Hypertensive chronic kidney disease with stage 1 through stage 4 chronic kidney disease, or unspecified chronic kidney disease: Secondary | ICD-10-CM | POA: Insufficient documentation

## 2018-01-29 MED ORDER — TRAMADOL HCL 50 MG PO TABS: 50.0000 mg | ORAL_TABLET | Freq: Two times a day (BID) | ORAL | 0 refills | Status: AC | PRN

## 2018-01-29 MED ORDER — METOPROLOL TARTRATE 50 MG PO TABS: ORAL_TABLET | ORAL | 3 refills | Status: DC

## 2018-01-29 NOTE — Patient Instructions (Signed)
Medication Instructions:  Your physician has recommended you make the following change in your medication:  Take Lopressor Take 50 mg in the AM and Take 25 mg in the PM Daily   If you need a refill on your cardiac medications before your next appointment, please call your pharmacy.   Lab work: NONE  If you have labs (blood work) drawn today and your tests are completely normal, you will receive your results only by: Marland Kitchen MyChart Message (if you have MyChart) OR . A paper copy in the mail If you have any lab test that is abnormal or we need to change your treatment, we will call you to review the results.  Testing/Procedures: NONE   Follow-Up: At [facility], you and your health needs are our priority.  As part of our continuing mission to provide you with exceptional heart care, we have created designated Provider Care Teams.  These Care Teams include your primary Cardiologist (physician) and Advanced Practice Providers (APPs -  Physician Assistants and Nurse Practitioners) who all work together to provide you with the care you need, when you need it. You will need a follow up appointment in 3 months.  Please call our office 2 months in advance to schedule this appointment.  You may see Kate Sable, MD or one of the following Advanced Practice Providers on your designated Care Team:   [name], PA-C [facility]) . Ermalinda Barrios, PA-C ([place])  Any Other Special Instructions Will Be Listed Below (If Applicable). Thank you for choosing [facility]!

## 2018-01-29 NOTE — ED Provider Notes (Signed)
[facility] EMERGENCY DEPARTMENT Provider Note   CSN: [id number] Arrival date & time: 01/29/18  1108     History   Chief Complaint Chief Complaint  Patient presents with  . Elbow Injury    HPI Bailey Bishop is a 77 y.o. female past medical history significant for hypertension, atrial fibrillation, anxiety, arthritis presents for evaluation of left elbow pain.  Patient was seen approximately 1 week ago and diagnosed with a closed, nondisplaced radial head fracture.  She followed up with Dr. Aline Brochure after her original ED visit.  Patient states she was told to follow-up with him in 2 months and was told not to twist her elbow.  Patient states that she has had continued pain at the elbow.  States that she will be "busy doing things and forgets not to twist my arm."  Patient states that when she bumps her elbow when she walks into her room she has increased pain at her elbow.  Has been taking Tylenol 1000 mg 2-3 times daily for her pain.  States she is still having breakthrough pain.  Denies fever, chills, swelling, redness, warmth, numbness/tingling to bilateral upper extremities.  Denies decreased range of motion at her elbow.  Patient states that she was unable to sleep the last 2 nights because she sleeps on her left side and hits her elbow.  Patient states she does have a follow-up appointment on December 6 with Dr. Aline Brochure for reevaluation.  History obtained from patient and significant other.  No interpreter was used.  HPI  Past Medical History:  Diagnosis Date  . Anxiety   . Arthritis   . Asthma   . Atrial fibrillation ([place])    a. s/p unsuccessful DCCV in 01/2017 and intolerant to Amiodarone --> Rate-control strategy pursued since  . Cancer [name]) 2001   right breast  . Dyspnea   . GERD (gastroesophageal reflux disease)   . Hypertension     Patient Active Problem List   Diagnosis Date Noted  . Visit for monitoring Tikosyn therapy 12/02/2017  . Atrial fibrillation  ([place]) 03/31/2017  . Persistent atrial fibrillation   . Long term (current) use of anticoagulants [Z79.01] 11/06/2016  . Anticoagulated 10/03/2016  . COPD (chronic obstructive pulmonary disease) ([place]) 10/03/2016  . Hypokalemia 09/24/2016  . Asthma in adult without complication 42/35/3614  . Atrial fibrillation with RVR ([place]) 09/23/2016  . CKD (chronic kidney disease), stage II 09/23/2016  . Hyperglycemia 09/23/2016  . Anxiety   . GERD (gastroesophageal reflux disease)   . Shortness of breath   . Essential hypertension   . Status asthmaticus 07/07/2014    Past Surgical History:  Procedure Laterality Date  . CARDIOVERSION N/A 02/18/2017   Procedure: CARDIOVERSION;  Surgeon: Josue Hector, MD;  Location: [name] ENDOSCOPY;  Service: Cardiovascular;  Laterality: N/A;  . CARDIOVERSION N/A 12/04/2017   Procedure: CARDIOVERSION;  Surgeon: [name], MD;  Location: [place];  Service: Cardiovascular;  Laterality: N/A;  . CATARACT EXTRACTION W/PHACO  07/23/2011   Procedure: CATARACT EXTRACTION PHACO AND INTRAOCULAR LENS PLACEMENT ([place]);  Surgeon: Williams Che, MD;  Location: [facility] OR[facility];  Service: Ophthalmology;  Laterality: Right;  CDE:9.96  . CHOLECYSTECTOMY N/A 02/14/2015   Procedure: LAPAROSCOPIC CHOLECYSTECTOMY;  Surgeon: Aviva Signs, MD;  Location: [facility] ORS;  Service: General;  Laterality: N/A;  . MASTECTOMY     bilateral mastectomy-right breast cancer-left taken by choice  . SEPTOPLASTY  1995     OB History   None      Home Medications  Prior to Admission medications   Medication Sig Start Date End Date Taking? Authorizing Provider  ALPRAZolam Duanne Moron) 0.5 MG tablet Take 0.5 mg by mouth at bedtime.  06/18/14   [provider]  BREO ELLIPTA 100-25 MCG/INH AEPB Inhale 1 puff into the lungs daily as needed (breathing.).  01/31/15   [provider]  fluticasone (FLONASE) 50 MCG/ACT nasal spray Place 2 sprays into both nostrils daily as needed for allergies.   01/29/17   [provider]  furosemide (LASIX) 40 MG tablet Take 1 tablet (40 mg total) by mouth daily. 04/04/17   Dhungel, Flonnie Overman, MD  loratadine (CLARITIN) 10 MG tablet Take 10 mg by mouth daily.    [provider]  metoprolol tartrate (LOPRESSOR) 50 MG tablet Take 50 mg ( 1 Tablet ) in the AM and 25 mg (1/2 Tablet ) in the PM 01/29/18   Evans Lance, MD  omeprazole (PRILOSEC) 20 MG capsule Take 20 mg daily as needed by mouth (indigestion).  02/14/16   [provider]  potassium chloride SA (K-DUR,KLOR-CON) 20 MEQ tablet Take 1 tablet (20 mEq total) by mouth 2 (two) times daily. 12/20/17   Sherran Needs, NP  traMADol (ULTRAM) 50 MG tablet Take 1 tablet (50 mg total) by mouth every 12 (twelve) hours as needed for up to 3 days for severe pain. 01/29/18 02/01/18  Aryn Kops A, PA-C  warfarin (COUMADIN) 2.5 MG tablet Take 1 tablet daily except 2 tablets on Sundays 01/27/18   Herminio Commons, MD    Family History Family History  Problem Relation Age of Onset  . Cancer Mother   . Aneurysm Father   . Cancer Sister   . Cancer Sister   . Anesthesia problems Neg Hx   . Hypotension Neg Hx   . Malignant hyperthermia Neg Hx   . Pseudochol deficiency Neg Hx     Social History Social History   Tobacco Use  . Smoking status: Former Smoker    Packs/day: 1.00    Years: 28.00    Pack years: 28.00    Types: Cigarettes    Last attempt to quit: 07/17/1983    Years since quitting: 34.5  . Smokeless tobacco: Never Used  Substance Use Topics  . Alcohol use: No  . Drug use: No     Allergies   Cardizem cd [diltiazem hcl er beads]; Amiodarone; Metoprolol tartrate; Sulfa antibiotics; Tomato; Carvedilol; and Chocolate   Review of Systems Review of Systems  Constitutional: Negative.   Respiratory: Negative for cough and shortness of breath.   Cardiovascular: Negative for chest pain.  Gastrointestinal: Negative.   Musculoskeletal:       Left elbow pain.    Skin: Negative.   Neurological: Negative.   All other systems reviewed and are negative.    Physical Exam Updated Vital Signs BP 134/82 (BP Location: Right Arm)   Pulse (!) 101 Comment: pt states she has Afib  Temp (!) 97.5 F (36.4 C) (Oral)   Resp 16   SpO2 97%   Physical Exam  Constitutional: She appears well-developed and well-nourished. No distress.  HENT:  Head: Atraumatic.  Eyes: Pupils are equal, round, and reactive to light.  Neck: Normal range of motion.  Cardiovascular: Normal rate and intact distal pulses.  Pulmonary/Chest: No respiratory distress.  Abdominal: She exhibits no distension.  Musculoskeletal: Normal range of motion.  Tenderness to palpation to proximal radial head.  Full range of motion bilateral upper extremity.  5/5 strength to bilateral upper  extremity.  No tenderness to palpation over distal radius/ulna, metacarpals or wrist.  She is able to pronate and supinate wrist without difficulty.  5/5 grip strength.  Upper extremity compartments are soft.  Neurological: She is alert. She has normal strength and normal reflexes. No sensory deficit.  Intact sensation to sharp and dull lateral upper extremity.  Skin: Skin is warm and dry. She is not diaphoretic.  No evidence of contusion, abrasion, rashes or lesions.  No evidence of edema, erythema, ecchymosis or warmth to bilateral upper extremity.  Psychiatric: She has a normal mood and affect.  Nursing note and vitals reviewed.    ED Treatments / Results  Labs (all labs ordered are listed, but only abnormal results are displayed) Labs Reviewed - No data to display  EKG None  Radiology No results found.  Procedures Procedures (including critical care time)  Medications Ordered in ED Medications - No data to display   Initial Impression / Assessment and Plan / ED Course  I have reviewed the triage vital signs and the nursing notes.  Pertinent labs & imaging results that were available during  my care of the patient were reviewed by me and considered in my medical decision making (see chart for details).  77 year old female who appears otherwise well presents for evaluation of left elbow pain.  Patient was seen in the ED approximately 1 week ago was diagnosed with a closed nondisplaced fracture of the radial head.  Patient was seen by Dr. Aline Brochure with orthopedics for reevaluation 4 days ago.  Patient has had pain located to the elbow at the site of the fracture.  Has been taking Tylenol 1g TID with mild relief of her symptoms as well as ibuprofen.  States she has been unable to sleep at night secondary to elbow pain.  Normal musculoskeletal exam.  Neurovascularly intact.  Tenderness to palpation over proximal radial head.  Will place an Ace bandage as well as offer support with a sling.  Discussed with patient she will need close follow-up with Dr. Aline Brochure for reevaluation. Low suspicion for emergent pathology causing patient's pain.  Extremity compartments are soft.  Discussed not driving or operating heavy machinery while taking this medicine.  Discussed reasons to return to the emergency department.  Patient voiced understanding is agreeable for follow-up.    Final Clinical Impressions(s) / ED Diagnoses   Final diagnoses:  Left elbow pain    ED Discharge Orders         Ordered    traMADol (ULTRAM) 50 MG tablet  Every 12 hours PRN     01/29/18 1251           Edan Serratore A, PA-C 01/29/18 1306    Fredia Sorrow, MD 01/31/18 1252

## 2018-01-29 NOTE — Telephone Encounter (Signed)
Called to verify dose of Lopressor.

## 2018-01-29 NOTE — ED Triage Notes (Signed)
Patient states last week she fell against the wall hitting her left elbow. States she was treated here for same and was told she had a "crack in my elbow." Complaining of continuing pain and states she has follow up appt December 6. States "I need someone to wrap this thing to keep me from bending it."

## 2018-01-29 NOTE — Discharge Instructions (Addendum)
You were evaluated today for continued elbow pain.  We have wrapped your arm with an Ace wrap and provided you a sling.  He will need to follow-up with Dr. Aline Brochure, your orthopedist for reevaluation.  I have prescribed you tramadol.  This is only to get you until tomorrow when you can meet with Dr. Aline Brochure.  Do not drive or operate heavy machinery while taking this medicine.  Return to the ED for any new or worsening symptoms.

## 2018-01-29 NOTE — Telephone Encounter (Signed)
Please call [place]

## 2018-01-29 NOTE — Progress Notes (Signed)
HPI Bailey Bishop returns today for followup of her uncontrolled atrial fib. She has failed both amio and dofetilide. Her rates have been better on toprol. She wore a cardiac monitor which demonstrated fast rates but an ave rate of around 100/min. Her palpitations and sob have improved although she gets both with exertion. No edema. No syncope.  Allergies  Allergen Reactions  . Cardizem Cd [Diltiazem Hcl Er Beads] Diarrhea  . Amiodarone Nausea And Vomiting  . Metoprolol Tartrate Diarrhea  . Sulfa Antibiotics Nausea And Vomiting  . Tomato Other (See Comments)    Burns stomach  . Carvedilol Rash  . Chocolate Rash    Dark chocolate     Current Outpatient Medications  Medication Sig Dispense Refill  . ALPRAZolam (XANAX) 0.5 MG tablet Take 0.5 mg by mouth at bedtime.     Marland Kitchen BREO ELLIPTA 100-25 MCG/INH AEPB Inhale 1 puff into the lungs daily as needed (breathing.).     Marland Kitchen fluticasone (FLONASE) 50 MCG/ACT nasal spray Place 2 sprays into both nostrils daily as needed for allergies.     . furosemide (LASIX) 40 MG tablet Take 1 tablet (40 mg total) by mouth daily. 30 tablet 6  . loratadine (CLARITIN) 10 MG tablet Take 10 mg by mouth daily.    . metoprolol tartrate (LOPRESSOR) 50 MG tablet Take 25 mg by mouth 2 (two) times daily.    Marland Kitchen omeprazole (PRILOSEC) 20 MG capsule Take 20 mg daily as needed by mouth (indigestion).     . potassium chloride SA (K-DUR,KLOR-CON) 20 MEQ tablet Take 1 tablet (20 mEq total) by mouth 2 (two) times daily. 180 tablet 2  . warfarin (COUMADIN) 2.5 MG tablet Take 1 tablet daily except 2 tablets on Sundays 96 tablet 3   No current facility-administered medications for this visit.      Past Medical History:  Diagnosis Date  . Anxiety   . Arthritis   . Asthma   . Atrial fibrillation ([place])    a. s/p unsuccessful DCCV in 01/2017 and intolerant to Amiodarone --> Rate-control strategy pursued since  . Cancer [facility]) 2001   right breast  . Dyspnea   . GERD  (gastroesophageal reflux disease)   . Hypertension     ROS:   All systems reviewed and negative except as noted in the HPI.   Past Surgical History:  Procedure Laterality Date  . CARDIOVERSION N/A 02/18/2017   Procedure: CARDIOVERSION;  Surgeon: Josue Hector, MD;  Location: [facility] ENDOSCOPY;  Service: Cardiovascular;  Laterality: N/A;  . CARDIOVERSION N/A 12/04/2017   Procedure: CARDIOVERSION;  Surgeon: [name], MD;  Location: [place];  Service: Cardiovascular;  Laterality: N/A;  . CATARACT EXTRACTION W/PHACO  07/23/2011   Procedure: CATARACT EXTRACTION PHACO AND INTRAOCULAR LENS PLACEMENT ([place]);  Surgeon: Williams Che, MD;  Location: [facility];  Service: Ophthalmology;  Laterality: Right;  CDE:9.96  . CHOLECYSTECTOMY N/A 02/14/2015   Procedure: LAPAROSCOPIC CHOLECYSTECTOMY;  Surgeon: Aviva Signs, MD;  Location: [facility];  Service: General;  Laterality: N/A;  . MASTECTOMY     bilateral mastectomy-right breast cancer-left taken by choice  . SEPTOPLASTY  1995     Family History  Problem Relation Age of Onset  . Cancer Mother   . Aneurysm Father   . Cancer Sister   . Cancer Sister   . Anesthesia problems Neg Hx   . Hypotension Neg Hx   . Malignant hyperthermia Neg Hx   . Pseudochol deficiency Neg Hx  Social History   Socioeconomic History  . Marital status: Married    Spouse name: Not on file  . Number of children: Not on file  . Years of education: Not on file  . Highest education level: Not on file  Occupational History  . Not on file  Social Needs  . Financial resource strain: Not on file  . Food insecurity:    Worry: Not on file    Inability: Not on file  . Transportation needs:    Medical: Not on file    Non-medical: Not on file  Tobacco Use  . Smoking status: Former Smoker    Packs/day: 1.00    Years: 28.00    Pack years: 28.00    Types: Cigarettes    Last attempt to quit: 07/17/1983    Years since quitting: 34.5  . Smokeless tobacco:  Never Used  Substance and Sexual Activity  . Alcohol use: No  . Drug use: No  . Sexual activity: Never    Birth control/protection: Post-menopausal  Lifestyle  . Physical activity:    Days per week: Not on file    Minutes per session: Not on file  . Stress: Not on file  Relationships  . Social connections:    Talks on phone: Not on file    Gets together: Not on file    Attends religious service: Not on file    Active member of club or organization: Not on file    Attends meetings of clubs or organizations: Not on file    Relationship status: Not on file  . Intimate partner violence:    Fear of current or ex partner: Not on file    Emotionally abused: Not on file    Physically abused: Not on file    Forced sexual activity: Not on file  Other Topics Concern  . Not on file  Social History Narrative  . Not on file     BP 124/74 (BP Location: Right Arm)   Pulse (!) 101   Ht 5\' 4"  (1.626 m)   Wt 146 lb (66.2 kg)   SpO2 98%   BMI 25.06 kg/m   Physical Exam:  Well appearing NAD HEENT: Unremarkable Neck:  No JVD, no thyromegally Lymphatics:  No adenopathy Back:  No CVA tenderness Lungs:  Clear with no wheezes HEART:  Regular rate rhythm, no murmurs, no rubs, no clicks Abd:  soft, positive bowel sounds, no organomegally, no rebound, no guarding Ext:  2 plus pulses, no edema, no cyanosis, no clubbing Skin:  No rashes no nodules Neuro:  CN II through XII intact, motor grossly intact  EKG -none  Assess/Plan: 1. Atrial fib with a RVR - her rates are reasonably controlled. I have reviewed the treatment options with the patient and her husband. She is feeling better and I have asked her to uptitrate her toprol to 50 mg in the am and 25 mg in the evening. If she were to develop diarrhea, then we would add a little verapamil. 2. HTN - her blood pressure is controlled. 3. NSVT - she is asymptomatic.   Mikle Bosworth.D.

## 2018-02-04 ENCOUNTER — Ambulatory Visit: Payer: Medicare Other | Admitting: Cardiovascular Disease

## 2018-02-12 ENCOUNTER — Other Ambulatory Visit: Payer: Self-pay | Admitting: Adult Health

## 2018-02-17 ENCOUNTER — Ambulatory Visit (INDEPENDENT_AMBULATORY_CARE_PROVIDER_SITE_OTHER): Payer: Medicare Other | Admitting: *Deleted

## 2018-02-17 DIAGNOSIS — Z7901 Long term (current) use of anticoagulants: Secondary | ICD-10-CM | POA: Diagnosis not present

## 2018-02-17 DIAGNOSIS — I4891 Unspecified atrial fibrillation: Secondary | ICD-10-CM | POA: Diagnosis not present

## 2018-02-17 DIAGNOSIS — I4821 Permanent atrial fibrillation: Secondary | ICD-10-CM | POA: Diagnosis not present

## 2018-02-17 LAB — POCT INR: INR: 3.2 — AB (ref 2.0–3.0)

## 2018-02-17 NOTE — Patient Instructions (Signed)
S/P Tikosyn admission.  (failed) Decrease coumadin to 1 tablet daily Recheck 3 weeks

## 2018-02-18 DIAGNOSIS — Z0001 Encounter for general adult medical examination with abnormal findings: Secondary | ICD-10-CM | POA: Diagnosis not present

## 2018-02-18 DIAGNOSIS — I10 Essential (primary) hypertension: Secondary | ICD-10-CM | POA: Diagnosis not present

## 2018-02-18 DIAGNOSIS — J309 Allergic rhinitis, unspecified: Secondary | ICD-10-CM | POA: Diagnosis not present

## 2018-02-18 DIAGNOSIS — D531 Other megaloblastic anemias, not elsewhere classified: Secondary | ICD-10-CM | POA: Diagnosis not present

## 2018-02-18 DIAGNOSIS — E782 Mixed hyperlipidemia: Secondary | ICD-10-CM | POA: Diagnosis not present

## 2018-02-18 DIAGNOSIS — D649 Anemia, unspecified: Secondary | ICD-10-CM | POA: Diagnosis not present

## 2018-02-25 ENCOUNTER — Telehealth: Payer: Self-pay | Admitting: Internal Medicine

## 2018-02-25 MED ORDER — METOPROLOL TARTRATE 50 MG PO TABS: ORAL_TABLET | ORAL | 3 refills | Status: DC

## 2018-02-25 NOTE — Telephone Encounter (Signed)
Per pt phone call-- states the metoprolol tartrate (LOPRESSOR) 50 MG tablet [[id number]]  Is giving her terrible diarrhea

## 2018-02-25 NOTE — Telephone Encounter (Signed)
Patient dropped lopressor dose from 75 mg daily to 50 mg. States she got diarrhea and insomnia. She also states her HR is "fine" and that Dr Lovena Le told her to drop dose back if she had any problems and to let us know.    I will FYI Dr.Taylor

## 2018-02-26 ENCOUNTER — Ambulatory Visit: Payer: Medicare Other | Admitting: Orthopedic Surgery

## 2018-02-26 ENCOUNTER — Encounter: Payer: Self-pay | Admitting: Orthopedic Surgery

## 2018-02-27 ENCOUNTER — Telehealth: Payer: Self-pay | Admitting: Orthopedic Surgery

## 2018-02-27 NOTE — Telephone Encounter (Signed)
Patient called back in response to our message 02/26/18 regarding appointment for this date which she did not attend. States she had called and cancelled, and wants all appointments cancelled.

## 2018-03-05 DIAGNOSIS — N183 Chronic kidney disease, stage 3 (moderate): Secondary | ICD-10-CM | POA: Diagnosis not present

## 2018-03-05 DIAGNOSIS — D631 Anemia in chronic kidney disease: Secondary | ICD-10-CM | POA: Diagnosis not present

## 2018-03-05 DIAGNOSIS — D649 Anemia, unspecified: Secondary | ICD-10-CM | POA: Diagnosis not present

## 2018-03-05 DIAGNOSIS — Z6824 Body mass index (BMI) 24.0-24.9, adult: Secondary | ICD-10-CM | POA: Diagnosis not present

## 2018-03-06 ENCOUNTER — Ambulatory Visit: Payer: Medicare Other | Admitting: Internal Medicine

## 2018-03-10 ENCOUNTER — Ambulatory Visit (INDEPENDENT_AMBULATORY_CARE_PROVIDER_SITE_OTHER): Payer: Medicare Other | Admitting: *Deleted

## 2018-03-10 DIAGNOSIS — I4891 Unspecified atrial fibrillation: Secondary | ICD-10-CM | POA: Diagnosis not present

## 2018-03-10 DIAGNOSIS — Z7901 Long term (current) use of anticoagulants: Secondary | ICD-10-CM | POA: Diagnosis not present

## 2018-03-10 LAB — POCT INR: INR: 2.6 (ref 2.0–3.0)

## 2018-03-10 NOTE — Patient Instructions (Signed)
S/P Tikosyn admission.  (failed) Continue coumadin 1 tablet daily Recheck 4 weeks

## 2018-04-07 ENCOUNTER — Ambulatory Visit (INDEPENDENT_AMBULATORY_CARE_PROVIDER_SITE_OTHER): Payer: Medicare Other | Admitting: Pharmacist

## 2018-04-07 DIAGNOSIS — I4891 Unspecified atrial fibrillation: Secondary | ICD-10-CM | POA: Diagnosis not present

## 2018-04-07 DIAGNOSIS — Z7901 Long term (current) use of anticoagulants: Secondary | ICD-10-CM

## 2018-04-07 LAB — POCT INR: INR: 2.1 (ref 2.0–3.0)

## 2018-04-07 NOTE — Patient Instructions (Signed)
Description   S/P Tikosyn admission.  (failed) Continue coumadin 1 tablet daily Call [phone] if you need to come off your Coumadin for a procedure Recheck 4 weeks

## 2018-04-08 ENCOUNTER — Telehealth: Payer: Self-pay | Admitting: Cardiovascular Disease

## 2018-04-08 MED ORDER — VERAPAMIL HCL ER 120 MG PO TBCR: 120.0000 mg | EXTENDED_RELEASE_TABLET | Freq: Two times a day (BID) | ORAL | Status: DC

## 2018-04-08 MED ORDER — VERAPAMIL HCL ER 120 MG PO CP24: 120.0000 mg | ORAL_CAPSULE | Freq: Two times a day (BID) | ORAL | Status: DC

## 2018-04-08 NOTE — Telephone Encounter (Signed)
Patient wants to speak with nurse in regards to getting Lopressor changed to Verapamil. Please verify this with Dr.Koneswaran as well. / tg

## 2018-04-08 NOTE — Telephone Encounter (Signed)
Patient states the lopressor is making her hair fall out and she no longer wants to take it.She wants to go back on Verapamil even though she gets diarrhea. She is also going to call [name] NP to discuss

## 2018-04-08 NOTE — Telephone Encounter (Signed)
Discussed with patient - she would like to switch back to verapamil 120mg  BID as she was on previously and stop metoprolol due to hair loss. Discussed with [name] NP - will start verapamil and pt will call with BP/HR readings 5 days after starting.

## 2018-04-10 ENCOUNTER — Encounter: Payer: Self-pay | Admitting: General Surgery

## 2018-04-10 ENCOUNTER — Ambulatory Visit: Payer: Medicare Other | Admitting: General Surgery

## 2018-04-10 VITALS — BP 132/85 | HR 110 | Temp 97.3°F | Resp 18 | Wt 144.8 lb

## 2018-04-10 DIAGNOSIS — D509 Iron deficiency anemia, unspecified: Secondary | ICD-10-CM | POA: Diagnosis not present

## 2018-04-10 MED ORDER — PEG 3350-KCL-NABCB-NACL-NASULF 236 G PO SOLR: 4000.0000 mL | Freq: Once | ORAL | 0 refills | Status: AC

## 2018-04-10 NOTE — Patient Instructions (Signed)
Colonoscopy, Adult A colonoscopy is an exam to look at the entire large intestine. During the exam, a lubricated, flexible tube that has a camera on the end of it is inserted into the anus and then passed into the rectum, colon, and other parts of the large intestine. You may have a colonoscopy as a part of normal colorectal screening or if you have certain symptoms, such as:  Lack of red blood cells (anemia).  Diarrhea that does not go away.  Abdominal pain.  Blood in your stool (feces). A colonoscopy can help screen for and diagnose medical problems, including:  Tumors.  Polyps.  Inflammation.  Areas of bleeding. Tell a health care provider about:  Any allergies you have.  All medicines you are taking, including vitamins, herbs, eye drops, creams, and over-the-counter medicines.  Any problems you or family members have had with anesthetic medicines.  Any blood disorders you have.  Any surgeries you have had.  Any medical conditions you have.  Any problems you have had passing stool. What are the risks? Generally, this is a safe procedure. However, problems may occur, including:  Bleeding.  A tear in the intestine.  A reaction to medicines given during the exam.  Infection (rare). What happens before the procedure? Eating and drinking restrictions Follow instructions from your health care provider about eating and drinking, which may include:  A few days before the procedure - follow a low-fiber diet. Avoid nuts, seeds, dried fruit, raw fruits, and vegetables.  1-3 days before the procedure - follow a clear liquid diet. Drink only clear liquids, such as clear broth or bouillon, black coffee or tea, clear juice, clear soft drinks or sports drinks, gelatin dessert, and popsicles. Avoid any liquids that contain red or purple dye.  On the day of the procedure - do not eat or drink anything starting 2 hours before the procedure, or within the time period that your  health care provider recommends. Up to 2 hours before the procedure, you may continue to drink clear liquids, such as water or clear fruit juice. Bowel prep If you were prescribed an oral bowel prep to clean out your colon:  Take it as told by your health care provider. Starting the day before your procedure, you will need to drink a large amount of medicated liquid. The liquid will cause you to have multiple loose stools until your stool is almost clear or light green.  If your skin or anus gets irritated from diarrhea, you may use these to relieve the irritation: ? Medicated wipes, such as adult wet wipes with aloe and vitamin E. ? A skin-soothing product like petroleum jelly.  If you vomit while drinking the bowel prep, take a break for up to 60 minutes and then begin the bowel prep again. If vomiting continues and you cannot take the bowel prep without vomiting, call your health care provider.  To clean out your colon, you may also be given: ? Laxative medicines. ? Instructions about how to use an enema. General instructions  Ask your health care provider about: ? Changing or stopping your regular medicines or supplements. This is especially important if you are taking iron supplements, diabetes medicines, or blood thinners. ? Taking medicines such as aspirin and ibuprofen. These medicines can thin your blood. Do not take these medicines before the procedure if your health care provider tells you not to.  Plan to have someone take you home from the hospital or clinic. What happens during the procedure?  An IV may be inserted into one of your veins.  You will be given medicine to help you relax (sedative).  To reduce your risk of infection: ? Your health care team will wash or sanitize their hands. ? Your anal area will be washed with soap.  You will be asked to lie on your side with your knees bent.  Your health care provider will lubricate a long, thin, flexible tube. The  tube will have a camera and a light on the end.  The tube will be inserted into your anus.  The tube will be gently eased through your rectum and colon.  Air will be delivered into your colon to keep it open. You may feel some pressure or cramping.  The camera will be used to take images during the procedure.  A small tissue sample may be removed to be examined under a microscope (biopsy).  If small polyps are found, your health care provider may remove them and have them checked for cancer cells.  When the exam is done, the tube will be removed. The procedure may vary among health care providers and hospitals. What happens after the procedure?  Your blood pressure, heart rate, breathing rate, and blood oxygen level will be monitored until the medicines you were given have worn off.  Do not drive for 24 hours after the exam.  You may have a small amount of blood in your stool.  You may pass gas and have mild abdominal cramping or bloating due to the air that was used to inflate your colon during the exam.  It is up to you to get the results of your procedure. Ask your health care provider, or the department performing the procedure, when your results will be ready. Summary  A colonoscopy is an exam to look at the entire large intestine.  During a colonoscopy, a lubricated, flexible tube with a camera on the end of it is inserted into the anus and then passed into the colon and other parts of the large intestine.  Follow instructions from your health care provider about eating and drinking before the procedure.  If you were prescribed an oral bowel prep to clean out your colon, take it as told by your health care provider.  After your procedure, your blood pressure, heart rate, breathing rate, and blood oxygen level will be monitored until the medicines you were given have worn off. This information is not intended to replace advice given to you by your health care provider. Make  sure you discuss any questions you have with your health care provider. Document Released: 03/09/2000 Document Revised: 01/02/2017 Document Reviewed: 05/24/2015 Elsevier Interactive Patient Education  2019 [employer] Upper Endoscopy, Adult Upper endoscopy is a procedure to look inside the upper GI (gastrointestinal) tract. The upper GI tract is made up of:  The part of the body that moves food from your mouth to your stomach (esophagus).  The stomach.  The first part of your small intestine (duodenum). This procedure is also called esophagogastroduodenoscopy (EGD) or gastroscopy. In this procedure, your health care provider passes a thin, flexible tube (endoscope) through your mouth and down your esophagus into your stomach. A small camera is attached to the end of the tube. Images from the camera appear on a monitor in the exam room. During this procedure, your health care provider may also remove a small piece of tissue to be sent to a lab and examined under a microscope (biopsy). Your health care provider  may do an upper endoscopy to diagnose cancers of the upper GI tract. You may also have this procedure to find the cause of other conditions, such as:  Stomach pain.  Heartburn.  Pain or problems when swallowing.  Nausea and vomiting.  Stomach bleeding.  Stomach ulcers. Tell a health care provider about:  Any allergies you have.  All medicines you are taking, including vitamins, herbs, eye drops, creams, and over-the-counter medicines.  Any problems you or family members have had with anesthetic medicines.  Any blood disorders you have.  Any surgeries you have had.  Any medical conditions you have.  Whether you are pregnant or may be pregnant. What are the risks? Generally, this is a safe procedure. However, problems may occur, including:  Infection.  Bleeding.  Allergic reactions to medicines.  A tear or hole (perforation) in the esophagus, stomach, or  duodenum. What happens before the procedure? Staying hydrated Follow instructions from your health care provider about hydration, which may include:  Up to 2 hours before the procedure - you may continue to drink clear liquids, such as water, clear fruit juice, black coffee, and plain tea.  Eating and drinking restrictions Follow instructions from your health care provider about eating and drinking, which may include:  8 hours before the procedure - stop eating heavy meals or foods, such as meat, fried foods, or fatty foods.  6 hours before the procedure - stop eating light meals or foods, such as toast or cereal.  6 hours before the procedure - stop drinking milk or drinks that contain milk.  2 hours before the procedure - stop drinking clear liquids. Medicines Ask your health care provider about:  Changing or stopping your regular medicines. This is especially important if you are taking diabetes medicines or blood thinners.  Taking medicines such as aspirin and ibuprofen. These medicines can thin your blood. Do not take these medicines unless your health care provider tells you to take them.  Taking over-the-counter medicines, vitamins, herbs, and supplements. General instructions  Plan to have someone take you home from the hospital or clinic.  If you will be going home right after the procedure, plan to have someone with you for 24 hours.  Ask your health care provider what steps will be taken to help prevent infection. What happens during the procedure?   An IV will be inserted into one of your veins.  You may be given one or more of the following: ? A medicine to help you relax (sedative). ? A medicine to numb the throat (local anesthetic).  You will lie on your left side on an exam table.  Your health care provider will pass the endoscope through your mouth and down your esophagus.  Your health care provider will use the scope to check the inside of your  esophagus, stomach, and duodenum. Biopsies may be taken.  The endoscope will be removed. The procedure may vary among health care providers and hospitals. What happens after the procedure?  Your blood pressure, heart rate, breathing rate, and blood oxygen level will be monitored until you leave the hospital or clinic.  Do not drive for 24 hours if you were given a sedative during your procedure.  When your throat is no longer numb, you may be given some fluids to drink.  It is up to you to get the results of your procedure. Ask your health care provider, or the department that is doing the procedure, when your results will be ready. Summary  Upper endoscopy is a procedure to look inside the upper GI tract.  During the procedure, an IV will be inserted into one of your veins. You may be given a medicine to help you relax.  A medicine will be used to numb your throat.  The endoscope will be passed through your mouth and down your esophagus. This information is not intended to replace advice given to you by your health care provider. Make sure you discuss any questions you have with your health care provider. Document Released: 03/09/2000 Document Revised: 08/12/2017 Document Reviewed: 08/12/2017 Elsevier Interactive Patient Education  2019 [employer].

## 2018-04-10 NOTE — H&P (Signed)
Bailey Bishop; [id number]; 19-Jun-1940   HPI Patient is a 78 year old white female who was referred to my care by Dr. Hilma Favors for endoscopic work-up for anemia of unknown etiology.  Patient last had a colonoscopy over 15 years ago.  Patient denies any family history of colon cancer.  She denies any blood per rectum.  She does state her stool caliber seems to be decreased.  She denies any fever or chills.  She does not state that she specifically has any significant weight loss.  She is also had some dysphasia with decreased appetite.  No abdominal pain has been noted.  She has 0 out of 10 abdominal pain.  She is on Coumadin for chronic atrial fibrillation.  She does occasionally feel weak. Past Medical History:  Diagnosis Date  . Anxiety   . Arthritis   . Asthma   . Atrial fibrillation ([place])    a. s/p unsuccessful DCCV in 01/2017 and intolerant to Amiodarone --> Rate-control strategy pursued since  . Cancer [facility]) 2001   right breast  . Dyspnea   . GERD (gastroesophageal reflux disease)   . Hypertension     Past Surgical History:  Procedure Laterality Date  . CARDIOVERSION N/A 02/18/2017   Procedure: CARDIOVERSION;  Surgeon: Josue Hector, MD;  Location: [facility] ENDOSCOPY;  Service: Cardiovascular;  Laterality: N/A;  . CARDIOVERSION N/A 12/04/2017   Procedure: CARDIOVERSION;  Surgeon: [name], MD;  Location: [place];  Service: Cardiovascular;  Laterality: N/A;  . CATARACT EXTRACTION W/PHACO  07/23/2011   Procedure: CATARACT EXTRACTION PHACO AND INTRAOCULAR LENS PLACEMENT ([place]);  Surgeon: Williams Che, MD;  Location: [facility];  Service: Ophthalmology;  Laterality: Right;  CDE:9.96  . CHOLECYSTECTOMY N/A 02/14/2015   Procedure: LAPAROSCOPIC CHOLECYSTECTOMY;  Surgeon: Aviva Signs, MD;  Location: [facility];  Service: General;  Laterality: N/A;  . MASTECTOMY     bilateral mastectomy-right breast cancer-left taken by choice  . SEPTOPLASTY  1995    Family History  Problem  Relation Age of Onset  . Cancer Mother   . Aneurysm Father   . Cancer Sister   . Cancer Sister   . Anesthesia problems Neg Hx   . Hypotension Neg Hx   . Malignant hyperthermia Neg Hx   . Pseudochol deficiency Neg Hx     Current Outpatient Medications on File Prior to Visit  Medication Sig Dispense Refill  . ALPRAZolam (XANAX) 0.5 MG tablet Take 0.5 mg by mouth at bedtime.     Marland Kitchen BREO ELLIPTA 100-25 MCG/INH AEPB Inhale 1 puff into the lungs daily as needed (breathing.).     Marland Kitchen fluticasone (FLONASE) 50 MCG/ACT nasal spray Place 2 sprays into both nostrils daily as needed for allergies.     . furosemide (LASIX) 40 MG tablet Take 1 tablet (40 mg total) by mouth daily. 30 tablet 6  . loratadine (CLARITIN) 10 MG tablet Take 10 mg by mouth daily.    Marland Kitchen omeprazole (PRILOSEC) 20 MG capsule Take 20 mg daily as needed by mouth (indigestion).     . potassium chloride SA (K-DUR,KLOR-CON) 20 MEQ tablet Take 1 tablet (20 mEq total) by mouth 2 (two) times daily. 180 tablet 2  . potassium chloride SA (K-DUR,KLOR-CON) 20 MEQ tablet TAKE ONE (1) TABLET BY MOUTH EVERY DAY 90 tablet 1  . verapamil (CALAN-SR) 120 MG CR tablet Take 1 tablet (120 mg total) by mouth 2 (two) times daily.    Marland Kitchen warfarin (COUMADIN) 2.5 MG tablet Take 1 tablet daily except 2 tablets  on Sundays 96 tablet 3   No current facility-administered medications on file prior to visit.     Allergies  Allergen Reactions  . Cardizem Cd [Diltiazem Hcl Er Beads] Diarrhea  . Amiodarone Nausea And Vomiting  . Metoprolol Tartrate Diarrhea  . Sulfa Antibiotics Nausea And Vomiting  . Tomato Other (See Comments)    Burns stomach  . Carvedilol Rash  . Chocolate Rash    Dark chocolate    Social History   Substance and Sexual Activity  Alcohol Use No    Social History   Tobacco Use  Smoking Status Former Smoker  . Packs/day: 1.00  . Years: 28.00  . Pack years: 28.00  . Types: Cigarettes  . Last attempt to quit: 07/17/1983  . Years  since quitting: 34.7  Smokeless Tobacco Never Used    Review of Systems  Constitutional: Negative.   HENT: Positive for sinus pain.   Eyes: Positive for blurred vision.  Respiratory: Positive for shortness of breath.   Cardiovascular: Negative.   Gastrointestinal: Positive for heartburn.  Genitourinary: Negative.   Musculoskeletal: Positive for joint pain.  Skin: Negative.   Neurological: Negative.   Endo/Heme/Allergies: Bruises/bleeds easily.  Psychiatric/Behavioral: Negative.     Objective   Vitals:   04/10/18 1037  BP: 132/85  Pulse: (!) 110  Resp: 18  Temp: (!) 97.3 F (36.3 C)    Physical Exam Vitals signs reviewed.  Constitutional:      Appearance: Normal appearance. She is not ill-appearing.  HENT:     Head: Normocephalic and atraumatic.  Cardiovascular:     Rate and Rhythm: Rhythm irregular.     Heart sounds: Normal heart sounds. No murmur. No gallop.   Pulmonary:     Effort: Pulmonary effort is normal. No respiratory distress.     Breath sounds: Normal breath sounds. No stridor. No wheezing, rhonchi or rales.  Abdominal:     General: Abdomen is flat. There is no distension.     Palpations: Abdomen is soft. There is no mass.     Tenderness: There is no abdominal tenderness. There is no guarding or rebound.  Skin:    General: Skin is warm and dry.  Neurological:     Mental Status: She is alert and oriented to person, place, and time.    Dr. Delanna Ahmadi notes reviewed Assessment  Anemia of unknown etiology, chronic Coumadin use. Plan   Patient is scheduled for an EGD and colonoscopy on 04/22/2018.  The risks and benefits of the procedure including bleeding, infection, and cardiopulmonary difficulties were fully explained to the patient, who gave informed consent.  GoLYTELY has been prescribed.  Patient was instructed to stop her Coumadin 3 days prior to the procedure.

## 2018-04-10 NOTE — Progress Notes (Signed)
Bailey Bishop; [id number]; April 27, 1940   HPI Patient is a 78 year old white female who was referred to my care by Dr. Hilma Favors for endoscopic work-up for anemia of unknown etiology.  Patient last had a colonoscopy over 15 years ago.  Patient denies any family history of colon cancer.  She denies any blood per rectum.  She does state her stool caliber seems to be decreased.  She denies any fever or chills.  She does not state that she specifically has any significant weight loss.  She is also had some dysphasia with decreased appetite.  No abdominal pain has been noted.  She has 0 out of 10 abdominal pain.  She is on Coumadin for chronic atrial fibrillation.  She does occasionally feel weak. Past Medical History:  Diagnosis Date  . Anxiety   . Arthritis   . Asthma   . Atrial fibrillation ([place])    a. s/p unsuccessful DCCV in 01/2017 and intolerant to Amiodarone --> Rate-control strategy pursued since  . Cancer [facility]) 2001   right breast  . Dyspnea   . GERD (gastroesophageal reflux disease)   . Hypertension     Past Surgical History:  Procedure Laterality Date  . CARDIOVERSION N/A 02/18/2017   Procedure: CARDIOVERSION;  Surgeon: Josue Hector, MD;  Location: [facility] ENDOSCOPY;  Service: Cardiovascular;  Laterality: N/A;  . CARDIOVERSION N/A 12/04/2017   Procedure: CARDIOVERSION;  Surgeon: [name], MD;  Location: [place];  Service: Cardiovascular;  Laterality: N/A;  . CATARACT EXTRACTION W/PHACO  07/23/2011   Procedure: CATARACT EXTRACTION PHACO AND INTRAOCULAR LENS PLACEMENT ([place]);  Surgeon: Williams Che, MD;  Location: [facility];  Service: Ophthalmology;  Laterality: Right;  CDE:9.96  . CHOLECYSTECTOMY N/A 02/14/2015   Procedure: LAPAROSCOPIC CHOLECYSTECTOMY;  Surgeon: Aviva Signs, MD;  Location: [facility];  Service: General;  Laterality: N/A;  . MASTECTOMY     bilateral mastectomy-right breast cancer-left taken by choice  . SEPTOPLASTY  1995    Family History  Problem  Relation Age of Onset  . Cancer Mother   . Aneurysm Father   . Cancer Sister   . Cancer Sister   . Anesthesia problems Neg Hx   . Hypotension Neg Hx   . Malignant hyperthermia Neg Hx   . Pseudochol deficiency Neg Hx     Current Outpatient Medications on File Prior to Visit  Medication Sig Dispense Refill  . ALPRAZolam (XANAX) 0.5 MG tablet Take 0.5 mg by mouth at bedtime.     Marland Kitchen BREO ELLIPTA 100-25 MCG/INH AEPB Inhale 1 puff into the lungs daily as needed (breathing.).     Marland Kitchen fluticasone (FLONASE) 50 MCG/ACT nasal spray Place 2 sprays into both nostrils daily as needed for allergies.     . furosemide (LASIX) 40 MG tablet Take 1 tablet (40 mg total) by mouth daily. 30 tablet 6  . loratadine (CLARITIN) 10 MG tablet Take 10 mg by mouth daily.    Marland Kitchen omeprazole (PRILOSEC) 20 MG capsule Take 20 mg daily as needed by mouth (indigestion).     . potassium chloride SA (K-DUR,KLOR-CON) 20 MEQ tablet Take 1 tablet (20 mEq total) by mouth 2 (two) times daily. 180 tablet 2  . potassium chloride SA (K-DUR,KLOR-CON) 20 MEQ tablet TAKE ONE (1) TABLET BY MOUTH EVERY DAY 90 tablet 1  . verapamil (CALAN-SR) 120 MG CR tablet Take 1 tablet (120 mg total) by mouth 2 (two) times daily.    Marland Kitchen warfarin (COUMADIN) 2.5 MG tablet Take 1 tablet daily except 2 tablets  on Sundays 96 tablet 3   No current facility-administered medications on file prior to visit.     Allergies  Allergen Reactions  . Cardizem Cd [Diltiazem Hcl Er Beads] Diarrhea  . Amiodarone Nausea And Vomiting  . Metoprolol Tartrate Diarrhea  . Sulfa Antibiotics Nausea And Vomiting  . Tomato Other (See Comments)    Burns stomach  . Carvedilol Rash  . Chocolate Rash    Dark chocolate    Social History   Substance and Sexual Activity  Alcohol Use No    Social History   Tobacco Use  Smoking Status Former Smoker  . Packs/day: 1.00  . Years: 28.00  . Pack years: 28.00  . Types: Cigarettes  . Last attempt to quit: 07/17/1983  . Years  since quitting: 34.7  Smokeless Tobacco Never Used    Review of Systems  Constitutional: Negative.   HENT: Positive for sinus pain.   Eyes: Positive for blurred vision.  Respiratory: Positive for shortness of breath.   Cardiovascular: Negative.   Gastrointestinal: Positive for heartburn.  Genitourinary: Negative.   Musculoskeletal: Positive for joint pain.  Skin: Negative.   Neurological: Negative.   Endo/Heme/Allergies: Bruises/bleeds easily.  Psychiatric/Behavioral: Negative.     Objective   Vitals:   04/10/18 1037  BP: 132/85  Pulse: (!) 110  Resp: 18  Temp: (!) 97.3 F (36.3 C)    Physical Exam Vitals signs reviewed.  Constitutional:      Appearance: Normal appearance. She is not ill-appearing.  HENT:     Head: Normocephalic and atraumatic.  Cardiovascular:     Rate and Rhythm: Rhythm irregular.     Heart sounds: Normal heart sounds. No murmur. No gallop.   Pulmonary:     Effort: Pulmonary effort is normal. No respiratory distress.     Breath sounds: Normal breath sounds. No stridor. No wheezing, rhonchi or rales.  Abdominal:     General: Abdomen is flat. There is no distension.     Palpations: Abdomen is soft. There is no mass.     Tenderness: There is no abdominal tenderness. There is no guarding or rebound.  Skin:    General: Skin is warm and dry.  Neurological:     Mental Status: She is alert and oriented to person, place, and time.    Dr. Delanna Ahmadi notes reviewed Assessment  Anemia of unknown etiology, chronic Coumadin use. Plan   Patient is scheduled for an EGD and colonoscopy on 04/22/2018.  The risks and benefits of the procedure including bleeding, infection, and cardiopulmonary difficulties were fully explained to the patient, who gave informed consent.  GoLYTELY has been prescribed.  Patient was instructed to stop her Coumadin 3 days prior to the procedure.

## 2018-04-15 ENCOUNTER — Telehealth: Payer: Self-pay | Admitting: Pharmacist

## 2018-04-15 NOTE — Telephone Encounter (Signed)
-----   Message from Juluis Mire, RN sent at 04/10/2018  1:34 PM EST ----- Regarding: coumadin Pt called stating she is having a colonoscopy on the 28th and will need to come off her coumadin 3 days prior. Let her know if she needs any further instructions/move up her inr appt. Thanks [employer]

## 2018-04-15 NOTE — Telephone Encounter (Signed)
Patient with diagnosis of Afib on warfarin for anticoagulation.    Procedure: colonoscopy Date of procedure: 04/22/18  CHADS2-VASc score of  4 (CHF, HTN, AGE, DM2, stroke/tia x 2, CAD, AGE, female)  Per office protocol, patient can hold warfarin for 5 days prior to procedure - per GI only need to hold for 3 days - pt advised on instructions.   Patient will not need bridging with Lovenox (enoxaparin) around procedure.  Will keep recheck as scheduled on 2/10 post procedure

## 2018-04-15 NOTE — Telephone Encounter (Signed)
Patient states she is tolerating verapamil well. BP 101/74 HR 78-88. She will continue at current dose and follow with [name] as scheduled.

## 2018-04-22 ENCOUNTER — Other Ambulatory Visit: Payer: Self-pay

## 2018-04-22 ENCOUNTER — Encounter (HOSPITAL_COMMUNITY)
Admission: RE | Disposition: A | Discharge status: Home / Self Care | Payer: Self-pay | Source: Home / Self Care | Attending: General Surgery

## 2018-04-22 ENCOUNTER — Encounter (HOSPITAL_COMMUNITY): Payer: Self-pay | Admitting: *Deleted

## 2018-04-22 ENCOUNTER — Ambulatory Visit (HOSPITAL_COMMUNITY)
Admission: RE | Admit: 2018-04-22 | Discharge: 2018-04-22 | Disposition: A | Discharge status: Home / Self Care | Payer: Medicare Other | Attending: General Surgery | Admitting: General Surgery

## 2018-04-22 DIAGNOSIS — I482 Chronic atrial fibrillation, unspecified: Secondary | ICD-10-CM | POA: Insufficient documentation

## 2018-04-22 DIAGNOSIS — I10 Essential (primary) hypertension: Secondary | ICD-10-CM | POA: Insufficient documentation

## 2018-04-22 DIAGNOSIS — F419 Anxiety disorder, unspecified: Secondary | ICD-10-CM | POA: Diagnosis not present

## 2018-04-22 DIAGNOSIS — Z7901 Long term (current) use of anticoagulants: Secondary | ICD-10-CM | POA: Insufficient documentation

## 2018-04-22 DIAGNOSIS — D509 Iron deficiency anemia, unspecified: Secondary | ICD-10-CM | POA: Diagnosis not present

## 2018-04-22 DIAGNOSIS — Z853 Personal history of malignant neoplasm of breast: Secondary | ICD-10-CM | POA: Insufficient documentation

## 2018-04-22 DIAGNOSIS — Z79899 Other long term (current) drug therapy: Secondary | ICD-10-CM | POA: Insufficient documentation

## 2018-04-22 DIAGNOSIS — J45909 Unspecified asthma, uncomplicated: Secondary | ICD-10-CM | POA: Insufficient documentation

## 2018-04-22 DIAGNOSIS — Z9013 Acquired absence of bilateral breasts and nipples: Secondary | ICD-10-CM | POA: Diagnosis not present

## 2018-04-22 DIAGNOSIS — D123 Benign neoplasm of transverse colon: Secondary | ICD-10-CM

## 2018-04-22 DIAGNOSIS — K5730 Diverticulosis of large intestine without perforation or abscess without bleeding: Secondary | ICD-10-CM | POA: Diagnosis not present

## 2018-04-22 DIAGNOSIS — F17210 Nicotine dependence, cigarettes, uncomplicated: Secondary | ICD-10-CM | POA: Insufficient documentation

## 2018-04-22 DIAGNOSIS — K219 Gastro-esophageal reflux disease without esophagitis: Secondary | ICD-10-CM | POA: Diagnosis not present

## 2018-04-22 HISTORY — PX: POLYPECTOMY: SHX5525

## 2018-04-22 HISTORY — PX: ESOPHAGOGASTRODUODENOSCOPY: SHX5428

## 2018-04-22 HISTORY — PX: BIOPSY: SHX5522

## 2018-04-22 HISTORY — PX: COLONOSCOPY: SHX5424

## 2018-04-22 SURGERY — EGD (ESOPHAGOGASTRODUODENOSCOPY)
Anesthesia: Moderate Sedation

## 2018-04-22 MED ORDER — MEPERIDINE HCL 100 MG/ML IJ SOLN
INTRAMUSCULAR | Status: AC
  Filled 2018-04-22: qty 1

## 2018-04-22 MED ORDER — BUTAMBEN-TETRACAINE-BENZOCAINE 2-2-14 % EX AERO
INHALATION_SPRAY | CUTANEOUS | Status: AC
  Filled 2018-04-22: qty 5

## 2018-04-22 MED ORDER — MEPERIDINE HCL 50 MG/ML IJ SOLN
INTRAMUSCULAR | Status: DC | PRN
  Administered 2018-04-22: 50 mg

## 2018-04-22 MED ORDER — MIDAZOLAM HCL 5 MG/5ML IJ SOLN
INTRAMUSCULAR | Status: DC | PRN
  Administered 2018-04-22: 1 mg via INTRAVENOUS
  Administered 2018-04-22: 2 mg via INTRAVENOUS
  Administered 2018-04-22: 1 mg via INTRAVENOUS

## 2018-04-22 MED ORDER — SODIUM CHLORIDE 0.9 % IV SOLN
INTRAVENOUS | Status: DC
  Administered 2018-04-22: 08:00:00 via INTRAVENOUS

## 2018-04-22 MED ORDER — MIDAZOLAM HCL 5 MG/5ML IJ SOLN
INTRAMUSCULAR | Status: AC
  Filled 2018-04-22: qty 5

## 2018-04-22 NOTE — Discharge Instructions (Signed)
° °  Restart coumadin tomorrow.    Colonoscopy, Adult, Care After This sheet gives you information about how to care for yourself after your procedure. Your health care provider may also give you more specific instructions. If you have problems or questions, contact your health care provider. What can I expect after the procedure? After the procedure, it is common to have:  A small amount of blood in your stool for 24 hours after the procedure.  Some gas.  Mild abdominal cramping or bloating. Follow these instructions at home: General instructions  For the first 24 hours after the procedure: ? Do not drive or use machinery. ? Do not sign important documents. ? Do not drink alcohol. ? Do your regular daily activities at a slower pace than normal. ? Eat soft, easy-to-digest foods.  Take over-the-counter or prescription medicines only as told by your health care provider. Relieving cramping and bloating   Try walking around when you have cramps or feel bloated.  Apply heat to your abdomen as told by your health care provider. Use a heat source that your health care provider recommends, such as a moist heat pack or a heating pad. ? Place a towel between your skin and the heat source. ? Leave the heat on for 20-30 minutes. ? Remove the heat if your skin turns bright red. This is especially important if you are unable to feel pain, heat, or cold. You may have a greater risk of getting burned. Eating and drinking   Drink enough fluid to keep your urine pale yellow.  Resume your normal diet as instructed by your health care provider. Avoid heavy or fried foods that are hard to digest.  Avoid drinking alcohol for as long as instructed by your health care provider. Contact a health care provider if:  You have blood in your stool 2-3 days after the procedure. Get help right away if:  You have more than a small spotting of blood in your stool.  You pass large blood clots in your  stool.  Your abdomen is swollen.  You have nausea or vomiting.  You have a fever.  You have increasing abdominal pain that is not relieved with medicine. Summary  After the procedure, it is common to have a small amount of blood in your stool. You may also have mild abdominal cramping and bloating.  For the first 24 hours after the procedure, do not drive or use machinery, sign important documents, or drink alcohol.  Contact your health care provider if you have a lot of blood in your stool, nausea or vomiting, a fever, or increased abdominal pain. This information is not intended to replace advice given to you by your health care provider. Make sure you discuss any questions you have with your health care provider. Document Released: 10/25/2003 Document Revised: 01/02/2017 Document Reviewed: 05/24/2015 Elsevier Interactive Patient Education  2019 [employer].

## 2018-04-22 NOTE — Op Note (Signed)
[facility] Patient Name: Bailey Bishop Procedure Date: 04/22/2018 8:26 AM MRN: [id number] Date of Birth: 03-10-1941 Attending MD: Aviva Signs , MD CSN: [id number] Age: 78 Admit Type: Outpatient Procedure:                Upper GI endoscopy Indications:              Unexplained iron deficiency anemia Providers:                Aviva Signs, MD, Janeece Riggers, RN, Gerome Sam,                            RN, Aram Candela Referring MD:              Medicines:                Midazolam 4 mg IV, Meperidine 50 mg IV, Cetacaine                            spray Complications:            No immediate complications. Estimated blood loss:                            None. Estimated Blood Loss:     Estimated blood loss: none. Procedure:                Pre-Anesthesia Assessment:                           - Prior to the procedure, a History and Physical                            was performed, and patient medications and                            allergies were reviewed. The patient is competent.                            The risks and benefits of the procedure and the                            sedation options and risks were discussed with the                            patient. All questions were answered and informed                            consent was obtained. Patient identification and                            proposed procedure were verified by the physician,                            the nurse and the technician in the endoscopy  suite. Mental Status Examination: alert and                            oriented. Airway Examination: normal oropharyngeal                            airway and neck mobility. Respiratory Examination:                            clear to auscultation. CV Examination: irregularly                            irregular rate and rhythm. Prophylactic                            Antibiotics: The patient does not require                             prophylactic antibiotics. Prior Anticoagulants: The                            patient has taken Coumadin (warfarin), last dose                            was 3 days prior to procedure. ASA Grade                            Assessment: III - A patient with severe systemic                            disease. After reviewing the risks and benefits,                            the patient was deemed in satisfactory condition to                            undergo the procedure. The anesthesia plan was to                            use moderate sedation / analgesia (conscious                            sedation). Immediately prior to administration of                            medications, the patient was re-assessed for                            adequacy to receive sedatives. The heart rate,                            respiratory rate, oxygen saturations, blood  pressure, adequacy of pulmonary ventilation, and                            response to care were monitored throughout the                            procedure. The physical status of the patient was                            re-assessed after the procedure.                           - Prior to the procedure, a History and Physical                            was performed, and patient medications and                            allergies were reviewed. The patient is competent.                            The risks and benefits of the procedure and the                            sedation options and risks were discussed with the                            patient. All questions were answered and informed                            consent was obtained. Patient identification and                            proposed procedure were verified by the physician,                            the nurse and the technician in the endoscopy                            suite. Mental Status Examination: alert and                             oriented. Airway Examination: normal oropharyngeal                            airway and neck mobility. Respiratory Examination:                            clear to auscultation. CV Examination: irregularly                            irregular rate and rhythm. Prophylactic  Antibiotics: The patient does not require                            prophylactic antibiotics. Prior Anticoagulants: The                            patient has taken Coumadin (warfarin), last dose                            was 3 days prior to procedure. ASA Grade                            Assessment: III - A patient with severe systemic                            disease. After reviewing the risks and benefits,                            the patient was deemed in satisfactory condition to                            undergo the procedure. The anesthesia plan was to                            use moderate sedation / analgesia (conscious                            sedation). Immediately prior to administration of                            medications, the patient was re-assessed for                            adequacy to receive sedatives. The heart rate,                            respiratory rate, oxygen saturations, blood                            pressure, adequacy of pulmonary ventilation, and                            response to care were monitored throughout the                            procedure. The physical status of the patient was                            re-assessed after the procedure.                           After obtaining informed consent, the endoscope was  passed under direct vision. Throughout the                            procedure, the patient's blood pressure, pulse, and                            oxygen saturations were monitored continuously. The                            [id number]-[id number] ([id number]) scope was  introduced through the                            mouth, and advanced to the third part of duodenum.                            The patient tolerated the procedure well. The total                            duration of the procedure was 6 minutes. Scope In: 8:29:54 AM Scope Out: 8:33:11 AM Total Procedure Duration: 0 hours 3 minutes 17 seconds  Findings:      The esophagus was normal. No esophagitis noted. No ulcerations seen. Z       line at 40cm from the teeth.      The stomach was normal. Possible small hiatal hernia, not clinically       significant.      The examined duodenum was normal. Impression:               - Normal esophagus.                           - Normal stomach.                           - Normal examined duodenum.                           - No specimens collected. Moderate Sedation:      Moderate (conscious) sedation was administered by the endoscopy nurse       and supervised by the endoscopist. The patient's oxygen saturation,       heart rate, blood pressure and response to care were monitored. Total       physician intraservice time was 6 minutes.      Moderate (conscious) sedation was administered by the endoscopy nurse       and supervised by the endoscopist. The following parameters were       monitored: oxygen saturation, heart rate, blood pressure, and response       to care. Recommendation:           - Written discharge instructions were provided to                            the patient.                           - The signs and symptoms of potential delayed  complications were discussed with the patient.                           - Patient has a contact number available for                            emergencies.                           - Return to normal activities tomorrow.                           - Resume previous diet.                           - Resume Coumadin (warfarin) at prior dose                             tomorrow. Refer to [facility] for further                            adjustment of therapy. Procedure Code(s):        --- Professional ---                           [phone], Esophagogastroduodenoscopy, flexible,                            transoral; diagnostic, including collection of                            specimen(s) by brushing or washing, when performed                            (separate procedure) Diagnosis Code(s):        --- Professional ---                           D50.9, Iron deficiency anemia, unspecified CPT copyright 2018 American Medical Association. All rights reserved. The codes documented in this report are preliminary and upon coder review may  be revised to meet current compliance requirements. Aviva Signs, MD Aviva Signs, MD 04/22/2018 8:49:36 AM This report has been signed electronically. Number of Addenda: 0

## 2018-04-22 NOTE — Interval H&P Note (Signed)
History and Physical Interval Note:  04/22/2018 7:53 AM  Bailey Bishop  has presented today for surgery, with the diagnosis of anemia  The various methods of treatment have been discussed with the patient and family. After consideration of risks, benefits and other options for treatment, the patient has consented to  Procedure(s): ESOPHAGOGASTRODUODENOSCOPY (EGD) (N/A) BIOPSY (N/A) COLONOSCOPY (N/A) as a surgical intervention .  The patient's history has been reviewed, patient examined, no change in status, stable for surgery.  I have reviewed the patient's chart and labs.  Questions were answered to the patient's satisfaction.     Aviva Signs

## 2018-04-22 NOTE — Op Note (Signed)
[facility] Patient Name: Bailey Bishop Procedure Date: 04/22/2018 7:57 AM MRN: [id number] Date of Birth: 12-14-1940 Attending MD: Aviva Signs , MD CSN: [id number] Age: 78 Admit Type: Outpatient Procedure:                Colonoscopy Indications:              Unexplained iron deficiency anemia Providers:                Aviva Signs, MD, Janeece Riggers, RN, Gerome Sam,                            RN, Aram Candela Referring MD:              Medicines:                Midazolam 4 mg IV, Meperidine 50 mg IV Complications:            No immediate complications. Estimated blood loss:                            None. Estimated Blood Loss:     Estimated blood loss was minimal. Procedure:                Pre-Anesthesia Assessment:                           - Prior to the procedure, a History and Physical                            was performed, and patient medications and                            allergies were reviewed. The patient is competent.                            The risks and benefits of the procedure and the                            sedation options and risks were discussed with the                            patient. All questions were answered and informed                            consent was obtained. Patient identification and                            proposed procedure were verified by the physician,                            the nurse and the technician in the endoscopy                            suite. Mental Status Examination: alert and  oriented. Airway Examination: normal oropharyngeal                            airway and neck mobility. Respiratory Examination:                            clear to auscultation. CV Examination: irregularly                            irregular rate and rhythm. Prophylactic                            Antibiotics: The patient does not require                            prophylactic antibiotics.  Prior Anticoagulants: The                            patient has taken Coumadin (warfarin), last dose                            was 3 days prior to procedure. ASA Grade                            Assessment: III - A patient with severe systemic                            disease. After reviewing the risks and benefits,                            the patient was deemed in satisfactory condition to                            undergo the procedure. The anesthesia plan was to                            use moderate sedation / analgesia (conscious                            sedation). Immediately prior to administration of                            medications, the patient was re-assessed for                            adequacy to receive sedatives. The heart rate,                            respiratory rate, oxygen saturations, blood                            pressure, adequacy of pulmonary ventilation, and  response to care were monitored throughout the                            procedure. The physical status of the patient was                            re-assessed after the procedure.                           After obtaining informed consent, the colonoscope                            was passed under direct vision. Throughout the                            procedure, the patient's blood pressure, pulse, and                            oxygen saturations were monitored continuously. The                            [id number]-[id number] ([id number]) scope was introduced through                            the anus and advanced to the the cecum, identified                            by the appendiceal orifice, ileocecal valve and                            palpation. No anatomical landmarks were                            photographed. The entire colon was well visualized.                            The patient tolerated the procedure well. The                            quality  of the bowel preparation was adequate. Scope In: 7:59:15 AM Scope Out: 8:24:45 AM Scope Withdrawal Time: 0 hours 19 minutes 34 seconds  Total Procedure Duration: 0 hours 25 minutes 30 seconds  Findings:      The perianal and digital rectal examinations were normal.      A 2 mm polyp was found in the distal transverse colon. The polyp was       sessile. The polyp was removed with a hot snare. Resection and retrieval       were complete. Estimated blood loss: none.      A 3 mm polyp was found in the distal transverse colon. The polyp was       semi-pedunculated. The polyp was removed with a hot snare. Resection and       retrieval were complete. Estimated blood loss was minimal. Two clips       applied for hemostasis. No bleeding noted after  washing performed.      A few small-mouthed diverticula were found in the sigmoid colon.      The exam was otherwise without abnormality. Impression:               - One 2 mm polyp in the distal transverse colon,                            removed with a hot snare. Resected and retrieved.                           - One 3 mm polyp in the distal transverse colon,                            removed with a hot snare. Resected and retrieved.                           - Diverticulosis in the sigmoid colon.                           - The examination was otherwise normal. Moderate Sedation:      Moderate (conscious) sedation was administered by the endoscopy nurse       and supervised by the endoscopist. The following parameters were       monitored: oxygen saturation, heart rate, blood pressure, and response       to care. Total physician intraservice time was 29 minutes. Recommendation:           - Written discharge instructions were provided to                            the patient.                           - The signs and symptoms of potential delayed                            complications were discussed with the patient.                            - Patient has a contact number available for                            emergencies.                           - Return to normal activities tomorrow.                           - Resume previous diet.                           - Post-Procedure Resumption of Anticoagulants:                            Restart warfarin tomorrow [Dose] [Route]                            [  Frequency] [MOQH Before INR] [Dosing] [INR Range]. Procedure Code(s):        --- Professional ---                           [phone], Colonoscopy, flexible; with removal of                            tumor(s), polyp(s), or other lesion(s) by snare                            technique                           99153, Moderate sedation; each additional 15                            minutes intraservice time                           [id number], Moderate sedation services provided by the                            same physician or other qualified health care                            professional performing a gastrointestinal                            endoscopic service that sedation supports,                            requiring the presence of an independent trained                            observer to assist in the monitoring of the                            patient's level of consciousness and physiological                            status; initial 15 minutes of intra-service time;                            patient age 42 years or older (additional time may                            be reported with [phone], as appropriate) Diagnosis Code(s):        --- Professional ---                           D12.3, Benign neoplasm of transverse colon (hepatic                            flexure or splenic flexure)  D50.9, Iron deficiency anemia, unspecified                           K57.30, Diverticulosis of large intestine without                            perforation or abscess without bleeding CPT copyright  2018 American Medical Association. All rights reserved. The codes documented in this report are preliminary and upon coder review may  be revised to meet current compliance requirements. Aviva Signs, MD Aviva Signs, MD 04/22/2018 8:44:53 AM This report has been signed electronically. Number of Addenda: 0

## 2018-04-23 ENCOUNTER — Ambulatory Visit (INDEPENDENT_AMBULATORY_CARE_PROVIDER_SITE_OTHER): Payer: Medicare Other | Admitting: Cardiovascular Disease

## 2018-04-23 ENCOUNTER — Encounter: Payer: Self-pay | Admitting: Cardiovascular Disease

## 2018-04-23 VITALS — BP 122/54 | HR 81 | Ht 64.0 in | Wt 151.0 lb

## 2018-04-23 DIAGNOSIS — I340 Nonrheumatic mitral (valve) insufficiency: Secondary | ICD-10-CM

## 2018-04-23 DIAGNOSIS — I5033 Acute on chronic diastolic (congestive) heart failure: Secondary | ICD-10-CM | POA: Diagnosis not present

## 2018-04-23 DIAGNOSIS — I4821 Permanent atrial fibrillation: Secondary | ICD-10-CM

## 2018-04-23 DIAGNOSIS — R600 Localized edema: Secondary | ICD-10-CM

## 2018-04-23 DIAGNOSIS — N183 Chronic kidney disease, stage 3 unspecified: Secondary | ICD-10-CM

## 2018-04-23 DIAGNOSIS — Z7901 Long term (current) use of anticoagulants: Secondary | ICD-10-CM | POA: Diagnosis not present

## 2018-04-23 DIAGNOSIS — I10 Essential (primary) hypertension: Secondary | ICD-10-CM

## 2018-04-23 MED ORDER — POTASSIUM CHLORIDE CRYS ER 20 MEQ PO TBCR: 20.0000 meq | EXTENDED_RELEASE_TABLET | Freq: Two times a day (BID) | ORAL | 1 refills | Status: DC

## 2018-04-23 MED ORDER — FUROSEMIDE 40 MG PO TABS: 40.0000 mg | ORAL_TABLET | Freq: Two times a day (BID) | ORAL | 6 refills | Status: DC

## 2018-04-23 NOTE — Addendum Note (Signed)
Addended byDebbora Lacrosse R on: 04/23/2018 02:50 PM   Modules accepted: Orders

## 2018-04-23 NOTE — Patient Instructions (Signed)
Medication Instructions:  INCREASE LASIX TO 40 MG TWO TIMES DAILY ( FIRST DOSE @ 8 AM, SECOND @ 4 PM )  TAKE POTASSIUM 20 MEQ- TWO TIMES DAILY   Labwork: NONE  Testing/Procedures: NONE  Follow-Up: Your physician recommends that you schedule a follow-up appointment in: 3 MONTHS    Any Other Special Instructions Will Be Listed Below (If Applicable).     If you need a refill on your cardiac medications before your next appointment, please call your pharmacy.

## 2018-04-23 NOTE — Progress Notes (Signed)
SUBJECTIVE: The patient presents for routine follow-up.  She was evaluated by EP for uncontrolled atrial fibrillation and failed both amiodarone and dofetilide. [name] increased Toprol to 50 mg in the morning and 25 mg in the evening on 01/29/2018.  She called our office stating that it was making her hair fall out and she no longer wanted to take it.  She wanted to go back on verapamil even though she gets diarrhea with this.  She takes Lasix 40 mg daily for chronic diastolic heart failure.  Today she presents complaining of bilateral leg and feet swelling.  This started occurring 3 days ago.  She started taking verapamil 2 weeks ago and started taking it with food and has not had any diarrhea.  Her heart rates have been in the 70 to 80 bpm range at home.  She is very pleased about this.  She has become constipated and takes a stool softener once daily.   Review of Systems: As per "subjective", otherwise negative.  Allergies  Allergen Reactions  . Cardizem Cd [Diltiazem Hcl Er Beads] Diarrhea  . Amiodarone Nausea And Vomiting  . Metoprolol Tartrate Diarrhea  . Sulfa Antibiotics Nausea And Vomiting  . Tomato Other (See Comments)    Burns stomach  . Carvedilol Rash  . Chocolate Rash    Dark chocolate    Current Outpatient Medications  Medication Sig Dispense Refill  . acetaminophen (TYLENOL) 500 MG tablet Take 500-1,000 mg by mouth every 6 (six) hours as needed (pain.).    Marland Kitchen ALPRAZolam (XANAX) 0.5 MG tablet Take 0.5 mg by mouth at bedtime.     Marland Kitchen BREO ELLIPTA 100-25 MCG/INH AEPB Inhale 1 puff into the lungs daily as needed (hay fever).     . fluticasone (FLONASE) 50 MCG/ACT nasal spray Place 2 sprays into both nostrils daily as needed for allergies.     . furosemide (LASIX) 40 MG tablet Take 1 tablet (40 mg total) by mouth daily. 30 tablet 6  . loratadine (CLARITIN) 10 MG tablet Take 10 mg by mouth daily as needed for allergies.    Marland Kitchen omeprazole (PRILOSEC) 20 MG capsule  Take 20 mg by mouth daily as needed (indigestion/stomach issues.).     Marland Kitchen potassium chloride SA (K-DUR,KLOR-CON) 20 MEQ tablet TAKE ONE (1) TABLET BY MOUTH EVERY DAY (Patient taking differently: Take 20 mEq by mouth daily. ) 90 tablet 1  . verapamil (CALAN-SR) 120 MG CR tablet Take 1 tablet (120 mg total) by mouth 2 (two) times daily.    Marland Kitchen warfarin (COUMADIN) 2.5 MG tablet Take 1 tablet daily except 2 tablets on Sundays (Patient taking differently: Take 2.5 mg by mouth every evening. ) 96 tablet 3   No current facility-administered medications for this visit.     Past Medical History:  Diagnosis Date  . Anxiety   . Arthritis   . Asthma   . Atrial fibrillation ([place])    a. s/p unsuccessful DCCV in 01/2017 and intolerant to Amiodarone --> Rate-control strategy pursued since  . Cancer [facility]) 2001   right breast  . Dyspnea   . GERD (gastroesophageal reflux disease)   . Hypertension     Past Surgical History:  Procedure Laterality Date  . CARDIOVERSION N/A 02/18/2017   Procedure: CARDIOVERSION;  Surgeon: Josue Hector, MD;  Location: [facility] ENDOSCOPY;  Service: Cardiovascular;  Laterality: N/A;  . CARDIOVERSION N/A 12/04/2017   Procedure: CARDIOVERSION;  Surgeon: [name], MD;  Location: [facility];  Service: Cardiovascular;  Laterality: N/A;  . CATARACT EXTRACTION W/PHACO  07/23/2011   Procedure: CATARACT EXTRACTION PHACO AND INTRAOCULAR LENS PLACEMENT (IOC);  Surgeon: Williams Che, MD;  Location: [facility] ORS;  Service: Ophthalmology;  Laterality: Right;  CDE:9.96  . CHOLECYSTECTOMY N/A 02/14/2015   Procedure: LAPAROSCOPIC CHOLECYSTECTOMY;  Surgeon: Aviva Signs, MD;  Location: [facility] ORS;  Service: General;  Laterality: N/A;  . MASTECTOMY     bilateral mastectomy-right breast cancer-left taken by choice  . SEPTOPLASTY  1995    Social History   Socioeconomic History  . Marital status: Married    Spouse name: Not on file  . Number of children: Not on file  . Years of education:  Not on file  . Highest education level: Not on file  Occupational History  . Not on file  Social Needs  . Financial resource strain: Not on file  . Food insecurity:    Worry: Not on file    Inability: Not on file  . Transportation needs:    Medical: Not on file    Non-medical: Not on file  Tobacco Use  . Smoking status: Former Smoker    Packs/day: 1.00    Years: 28.00    Pack years: 28.00    Types: Cigarettes    Last attempt to quit: 07/17/1983    Years since quitting: 34.7  . Smokeless tobacco: Never Used  Substance and Sexual Activity  . Alcohol use: No  . Drug use: No  . Sexual activity: Never    Birth control/protection: Post-menopausal  Lifestyle  . Physical activity:    Days per week: Not on file    Minutes per session: Not on file  . Stress: Not on file  Relationships  . Social connections:    Talks on phone: Not on file    Gets together: Not on file    Attends religious service: Not on file    Active member of club or organization: Not on file    Attends meetings of clubs or organizations: Not on file    Relationship status: Not on file  . Intimate partner violence:    Fear of current or ex partner: Not on file    Emotionally abused: Not on file    Physically abused: Not on file    Forced sexual activity: Not on file  Other Topics Concern  . Not on file  Social History Narrative  . Not on file     Vitals:   04/23/18 1412  BP: (!) 122/54  Pulse: 81  SpO2: 99%  Weight: 151 lb (68.5 kg)  Height: 5\' 4"  (1.626 m)    Wt Readings from Last 3 Encounters:  04/23/18 151 lb (68.5 kg)  04/22/18 142 lb (64.4 kg)  04/10/18 144 lb 12.8 oz (65.7 kg)     PHYSICAL EXAM General: NAD HEENT: Normal. Neck: No JVD, no thyromegaly. Lungs: Clear to auscultation bilaterally with normal respiratory effort. CV: Regular rate and mostly regular rhythm, normal S1/S2, no S3, 1/6 systolic murmur along left sternal border.  1+ pitting bilateral lower extremity edema.     Abdomen: Soft, nontender, no distention.  Neurologic: Alert and oriented.  Psych: Normal affect. Skin: Normal. Musculoskeletal: No gross deformities.    ECG: Reviewed above under Subjective   Labs: Lab Results  Component Value Date/Time   K 3.4 (L) 12/19/2017 10:12 AM   BUN 20 12/19/2017 10:12 AM   BUN 16 10/24/2016 08:06 AM   CREATININE 1.30 (H) 12/19/2017 10:12 AM   ALT 16 01/22/2017  01:08 PM   TSH 1.811 09/26/2016 05:58 AM   HGB 11.5 (L) 12/12/2017 11:20 AM     Lipids: Lab Results  Component Value Date/Time   LDLCALC 62 09/24/2016 04:38 AM   CHOL 109 09/24/2016 04:38 AM   TRIG 66 09/24/2016 04:38 AM   HDL 34 (L) 09/24/2016 04:38 AM       ASSESSMENT AND PLAN:  1.  Acute on chronic diastolic heart failure/bilateral leg edema: Weight reportedly increased 9 pounds from yesterday.  She takes Lasix 40 mg daily.  I suspect bilateral leg edema is due to verapamil but it is the only medication she has tolerated for atrial fibrillation.  Thus, I am not keen on stopping this.  I will increase Lasix to 40 mg every morning at 8 AM and every afternoon at 4 PM.  I will also increase supplemental potassium to 20 mg twice daily.  I will check a basic metabolic panel within the next several days.  2.  Permanent atrial fibrillation: Currently on verapamil 120 mg twice daily with good heart rate control.  She is no longer having diarrhea as she is now taking it with food.  However, I suspect it has led to bilateral leg edema but as it is the only medication she has tolerated, I am not keen on stopping this.   Toprol-XL reportedly led to alopecia and she quit taking it.  She failed both amiodarone and dofetilide.  She remains on warfarin for anticoagulation.  3.  Hypertension: Blood pressure is normal.  I will monitor given increased diuretic requirement.  4.  Chronic kidney disease stage III: Creatinine 1.3 on 12/19/2017.  I will obtain a follow-up basic metabolic panel within the next few  days as I am increasing Lasix.  5.  Mitral regurgitation: Moderate in severity by echocardiogram in July 2018.  I would consider repeating later this year.   Disposition: Follow up 3 months   Kate Sable, M.D., F.A.C.C.

## 2018-04-24 ENCOUNTER — Encounter (HOSPITAL_COMMUNITY): Payer: Self-pay | Admitting: General Surgery

## 2018-04-25 ENCOUNTER — Telehealth: Payer: Self-pay | Admitting: Emergency Medicine

## 2018-04-25 NOTE — Telephone Encounter (Signed)
Called patient and notified her that she will need another colonoscopy in five years, patient stated she understood and that she will follow  Up with our office in five years to have one done.

## 2018-05-05 ENCOUNTER — Telehealth: Payer: Self-pay | Admitting: Cardiovascular Disease

## 2018-05-05 ENCOUNTER — Ambulatory Visit (INDEPENDENT_AMBULATORY_CARE_PROVIDER_SITE_OTHER): Payer: Medicare Other | Admitting: Pharmacist

## 2018-05-05 DIAGNOSIS — Z7901 Long term (current) use of anticoagulants: Secondary | ICD-10-CM | POA: Diagnosis not present

## 2018-05-05 DIAGNOSIS — I4891 Unspecified atrial fibrillation: Secondary | ICD-10-CM

## 2018-05-05 LAB — POCT INR: INR: 1.6 — AB (ref 2.0–3.0)

## 2018-05-05 NOTE — Telephone Encounter (Signed)
I will forward to MD

## 2018-05-05 NOTE — Patient Instructions (Signed)
Description   Take 2 tablets today and tomorrow, then continue coumadin 1 tablet daily  Call [phone] if you need to come off your Coumadin for a procedure Recheck 2 weeks

## 2018-05-05 NOTE — Telephone Encounter (Signed)
She will need something to control HR, and she has purportedly had reactions to everything else. Which one would she prefer to take (metoprolol, diltiazem, carvedilol, verapamil)?

## 2018-05-05 NOTE — Telephone Encounter (Signed)
Pt is itching from the verapamil (CALAN-SR) 120 MG CR tablet [[id number]]

## 2018-05-07 NOTE — Telephone Encounter (Signed)
Spoke with pt. She will continue taking verapamil.

## 2018-05-13 ENCOUNTER — Ambulatory Visit: Payer: Medicare Other | Admitting: Internal Medicine

## 2018-05-21 ENCOUNTER — Ambulatory Visit (INDEPENDENT_AMBULATORY_CARE_PROVIDER_SITE_OTHER): Payer: Medicare Other | Admitting: *Deleted

## 2018-05-21 DIAGNOSIS — I480 Paroxysmal atrial fibrillation: Secondary | ICD-10-CM | POA: Diagnosis not present

## 2018-05-21 DIAGNOSIS — Z7901 Long term (current) use of anticoagulants: Secondary | ICD-10-CM | POA: Diagnosis not present

## 2018-05-21 DIAGNOSIS — I4891 Unspecified atrial fibrillation: Secondary | ICD-10-CM

## 2018-05-21 DIAGNOSIS — Z5181 Encounter for therapeutic drug level monitoring: Secondary | ICD-10-CM

## 2018-05-21 LAB — POCT INR: INR: 1.4 — AB (ref 2.0–3.0)

## 2018-05-21 NOTE — Patient Instructions (Signed)
Take 2 tablets x 3 days then continue coumadin 1 tablet daily  Call [phone] if you need to come off your Coumadin for a procedure Recheck in 2 weeks

## 2018-05-22 ENCOUNTER — Telehealth: Payer: Self-pay | Admitting: *Deleted

## 2018-05-22 NOTE — Telephone Encounter (Signed)
Please give pt a call-- she has a question for you.

## 2018-05-22 NOTE — Telephone Encounter (Signed)
Spoke with pt.  She wanted to know if she could eat chicken fried rice.  Told her it would not effect her coumadin but she would need to be careful of the sodium content.  She verbalized understanding.

## 2018-05-26 DIAGNOSIS — M7061 Trochanteric bursitis, right hip: Secondary | ICD-10-CM | POA: Diagnosis not present

## 2018-05-26 DIAGNOSIS — M1611 Unilateral primary osteoarthritis, right hip: Secondary | ICD-10-CM | POA: Diagnosis not present

## 2018-05-26 DIAGNOSIS — Z1389 Encounter for screening for other disorder: Secondary | ICD-10-CM | POA: Diagnosis not present

## 2018-05-26 DIAGNOSIS — Z6822 Body mass index (BMI) 22.0-22.9, adult: Secondary | ICD-10-CM | POA: Diagnosis not present

## 2018-06-04 ENCOUNTER — Other Ambulatory Visit: Payer: Self-pay

## 2018-06-04 ENCOUNTER — Ambulatory Visit (INDEPENDENT_AMBULATORY_CARE_PROVIDER_SITE_OTHER): Payer: Medicare Other | Admitting: *Deleted

## 2018-06-04 DIAGNOSIS — I4891 Unspecified atrial fibrillation: Secondary | ICD-10-CM | POA: Diagnosis not present

## 2018-06-04 DIAGNOSIS — Z7901 Long term (current) use of anticoagulants: Secondary | ICD-10-CM

## 2018-06-04 DIAGNOSIS — Z5181 Encounter for therapeutic drug level monitoring: Secondary | ICD-10-CM

## 2018-06-04 LAB — POCT INR: INR: 1.3 — AB (ref 2.0–3.0)

## 2018-06-04 NOTE — Patient Instructions (Signed)
Take 2 tablets daily until INR check on 3/16 Call [phone] if you need to come off your Coumadin for a procedure

## 2018-06-06 ENCOUNTER — Telehealth: Payer: Self-pay | Admitting: Cardiovascular Disease

## 2018-06-06 NOTE — Telephone Encounter (Signed)
Pt stats the verapamil (CALAN-SR) 120 MG CR tablet [[id number]]  Is making her blood pressure "go out of sight":   Pt would like someone to please give her a call and let her know what she should do.  Did not give any readings, call was dropped.

## 2018-06-06 NOTE — Telephone Encounter (Signed)
Pt was confused about her medications. Went over what she should be taking. She wrote it on her bottle. She thanked me for the call.

## 2018-06-06 NOTE — Telephone Encounter (Signed)
Please call [phone], her land line is down

## 2018-06-09 ENCOUNTER — Other Ambulatory Visit: Payer: Self-pay

## 2018-06-09 ENCOUNTER — Ambulatory Visit (INDEPENDENT_AMBULATORY_CARE_PROVIDER_SITE_OTHER): Payer: Medicare Other | Admitting: *Deleted

## 2018-06-09 DIAGNOSIS — Z7901 Long term (current) use of anticoagulants: Secondary | ICD-10-CM | POA: Diagnosis not present

## 2018-06-09 DIAGNOSIS — I4891 Unspecified atrial fibrillation: Secondary | ICD-10-CM | POA: Diagnosis not present

## 2018-06-09 LAB — POCT INR: INR: 1.9 — AB (ref 2.0–3.0)

## 2018-06-09 NOTE — Patient Instructions (Signed)
Continue coumadin 2 tablets daily until INR check on 3/23 Call [phone] if you need to come off your Coumadin for a procedure

## 2018-06-10 ENCOUNTER — Telehealth: Payer: Self-pay | Admitting: *Deleted

## 2018-06-10 NOTE — Telephone Encounter (Signed)
Spoke with pt.  She was able to get nose to stop bleeding by applying pressure and cool cloth.  INR was checked yesterday with INR of 1.9.  Told pt to monitor the rest of today.  Call back if nose bleeds return.  Go to ED for nose bleed she is unable to stop.  She verbalized understanding.  States she is fine now.  Has an INR appt for 3/23.  Will call back if she wants to be checked sooner.

## 2018-06-10 NOTE — Telephone Encounter (Signed)
Patient called stating that her nose started "pouring" blood.around 830am .

## 2018-06-12 ENCOUNTER — Telehealth: Payer: Self-pay | Admitting: *Deleted

## 2018-06-12 NOTE — Telephone Encounter (Signed)
1. Have you recently travelled abroad or to [place], [place], or [place]? NO 2. DO you currently have a fever? NO 3. Have you been in contact with someone that is currently pending confirmation of COVID19 testing or has been confirmed to have the COVID19 virus? NO 4. Are you currently experiencing fatigue or cough? NO 5. Are you currently experiencing new or worsening shortness of breath at rest or with minimal activity? NO 6. Have you been in contact with someone that was recently sick with fever/cough/fatigue? NO   **A score of 4 or more should result in cancellation of the pts cardiology appt  **A score of 2 should be provided a mask prior to admission into the lobby  **TRAVEL to a high risk area or contact with a confirmed case should stay at home, away from confirmed patient, monitor symptoms, and reach out to PCP for evisit, additional testing.   **ALL PTS WITH FEVER SHOULD BE REFERRED TO PCP FOR EVISIT  Pt. Advised that we are restricting visitors at this time and request that only patients present for check-in prior to their appointment. All other visitors should remain in their car. If necessary, only one visitor may come with the patient into the building. For everyone's safety, all patients and visitors entering our practice area should expect to be screened again prior to entering our waiting area.   PT CONFIRMED APPT

## 2018-06-16 ENCOUNTER — Other Ambulatory Visit: Payer: Self-pay

## 2018-06-16 ENCOUNTER — Ambulatory Visit (INDEPENDENT_AMBULATORY_CARE_PROVIDER_SITE_OTHER): Payer: Medicare Other | Admitting: *Deleted

## 2018-06-16 DIAGNOSIS — I4891 Unspecified atrial fibrillation: Secondary | ICD-10-CM

## 2018-06-16 DIAGNOSIS — I480 Paroxysmal atrial fibrillation: Secondary | ICD-10-CM

## 2018-06-16 DIAGNOSIS — Z7901 Long term (current) use of anticoagulants: Secondary | ICD-10-CM | POA: Diagnosis not present

## 2018-06-16 LAB — POCT INR: INR: 2.8 (ref 2.0–3.0)

## 2018-06-16 NOTE — Patient Instructions (Signed)
Decrease coumadin to 2 tablets daily except 1 tablet on Mondays and Thursdays Recheck in 2 weeks Call [phone] if you need to come off your Coumadin for a procedure

## 2018-07-01 ENCOUNTER — Telehealth: Payer: Self-pay | Admitting: *Deleted

## 2018-07-01 NOTE — Telephone Encounter (Signed)
°  COVID-19 Pre-Screening Questions:   Do you currently have a fever? no (yes = cancel and refer to pcp for e-visit)  Have you recently travelled on a cruise, internationally, or to [place], [place], [place], [place], or [place], [place] [employer]) ? no (yes = cancel, stay home, monitor symptoms, and contact pcp or initiate e-visit if symptoms develop)  Have you been in contact with someone that is currently pending confirmation of Covid19 testing or has been confirmed to have the Covid19 virus?  no (yes = cancel, stay home, away from tested individual, monitor symptoms, and contact pcp or initiate e-visit if symptoms develop)  Are you currently experiencing fatigue or cough? no (yes = pt should be prepared to have a mask placed at the time of their visit).

## 2018-07-02 ENCOUNTER — Telehealth: Payer: Self-pay | Admitting: *Deleted

## 2018-07-02 ENCOUNTER — Ambulatory Visit (INDEPENDENT_AMBULATORY_CARE_PROVIDER_SITE_OTHER): Payer: Medicare Other | Admitting: *Deleted

## 2018-07-02 DIAGNOSIS — I4891 Unspecified atrial fibrillation: Secondary | ICD-10-CM

## 2018-07-02 DIAGNOSIS — I480 Paroxysmal atrial fibrillation: Secondary | ICD-10-CM

## 2018-07-02 DIAGNOSIS — Z7901 Long term (current) use of anticoagulants: Secondary | ICD-10-CM | POA: Diagnosis not present

## 2018-07-02 LAB — POCT INR: INR: 1.7 — AB (ref 2.0–3.0)

## 2018-07-02 NOTE — Telephone Encounter (Signed)
Called pt.  Wanted to verify INR results.  INR result and new dose instructions confirmed.

## 2018-07-02 NOTE — Telephone Encounter (Signed)
Please call patient

## 2018-07-02 NOTE — Patient Instructions (Signed)
Take coumadin 3 tablets tonight, 1 1/2 tablets tomorrow night then resume 2 tablets daily except 1 tablet on Mondays and Thursdays Recheck in 2 weeks Call [phone] if you need to come off your Coumadin for a procedure

## 2018-07-17 ENCOUNTER — Telehealth: Payer: Self-pay | Admitting: Cardiovascular Disease

## 2018-07-17 NOTE — Telephone Encounter (Signed)
Virtual Visit Pre-Appointment Phone Call  "(Name), I am calling you today to discuss your upcoming appointment. We are currently trying to limit exposure to the virus that causes COVID-19 by seeing patients at home rather than in the office."  1. "What is the BEST phone number to call the day of the visit?" - include this in appointment notes  2. Do you have or have access to (through a family member/friend) a smartphone with video capability that we can use for your visit?" a. If yes - list this number in appt notes as cell (if different from BEST phone #) and list the appointment type as a VIDEO visit in appointment notes b. If no - list the appointment type as a PHONE visit in appointment notes  3. Confirm consent - "In the setting of the current Covid19 crisis, you are scheduled for a (phone or video) visit with your provider on (date) at (time).  Just as we do with many in-office visits, in order for you to participate in this visit, we must obtain consent.  If you'd like, I can send this to your mychart (if signed up) or email for you to review.  Otherwise, I can obtain your verbal consent now.  All virtual visits are billed to your insurance company just like a normal visit would be.  By agreeing to a virtual visit, we'd like you to understand that the technology does not allow for your provider to perform an examination, and thus may limit your provider's ability to fully assess your condition. If your provider identifies any concerns that need to be evaluated in person, we will make arrangements to do so.  Finally, though the technology is pretty good, we cannot assure that it will always work on either your or our end, and in the setting of a video visit, we may have to convert it to a phone-only visit.  In either situation, we cannot ensure that we have a secure connection.  Are you willing to proceed?" STAFF: Did the patient verbally acknowledge consent to telehealth visit? Document  YES/NO here: Yes  4. Advise patient to be prepared - "Two hours prior to your appointment, go ahead and check your blood pressure, pulse, oxygen saturation, and your weight (if you have the equipment to check those) and write them all down. When your visit starts, your provider will ask you for this information. If you have an Apple Watch or Kardia device, please plan to have heart rate information ready on the day of your appointment. Please have a pen and paper handy nearby the day of the visit as well."  5. Give patient instructions for MyChart download to smartphone OR [email or web address] as below if video visit (depending on what platform provider is using)  6. Inform patient they will receive a phone call 15 minutes prior to their appointment time (may be from unknown caller ID) so they should be prepared to answer    TELEPHONE CALL NOTE  JAMIRAH ZELAYA has been deemed a candidate for a follow-up tele-health visit to limit community exposure during the Covid-19 pandemic. I spoke with the patient via phone to ensure availability of phone/video source, confirm preferred email & phone number, and discuss instructions and expectations.  I reminded JALIZA SEIFRIED to be prepared with any vital sign and/or heart rhythm information that could potentially be obtained via home monitoring, at the time of her visit. I reminded SHANDIIN EISENBEIS to expect a phone call prior to  her visit.  Orinda Kenner 07/17/2018 2:31 PM

## 2018-07-22 ENCOUNTER — Telehealth: Payer: Self-pay | Admitting: *Deleted

## 2018-07-23 ENCOUNTER — Ambulatory Visit (INDEPENDENT_AMBULATORY_CARE_PROVIDER_SITE_OTHER): Payer: Medicare Other | Admitting: Pharmacist Clinician (PhC)/ Clinical Pharmacy Specialist

## 2018-07-23 ENCOUNTER — Encounter: Payer: Self-pay | Admitting: Cardiovascular Disease

## 2018-07-23 ENCOUNTER — Encounter: Payer: Self-pay | Admitting: *Deleted

## 2018-07-23 ENCOUNTER — Telehealth (INDEPENDENT_AMBULATORY_CARE_PROVIDER_SITE_OTHER): Payer: Medicare Other | Admitting: Cardiovascular Disease

## 2018-07-23 VITALS — BP 118/63 | HR 77 | Ht 64.0 in | Wt 132.0 lb

## 2018-07-23 DIAGNOSIS — I10 Essential (primary) hypertension: Secondary | ICD-10-CM

## 2018-07-23 DIAGNOSIS — N183 Chronic kidney disease, stage 3 unspecified: Secondary | ICD-10-CM

## 2018-07-23 DIAGNOSIS — I4891 Unspecified atrial fibrillation: Secondary | ICD-10-CM

## 2018-07-23 DIAGNOSIS — I4821 Permanent atrial fibrillation: Secondary | ICD-10-CM

## 2018-07-23 DIAGNOSIS — Z7901 Long term (current) use of anticoagulants: Secondary | ICD-10-CM | POA: Diagnosis not present

## 2018-07-23 DIAGNOSIS — I5032 Chronic diastolic (congestive) heart failure: Secondary | ICD-10-CM

## 2018-07-23 DIAGNOSIS — I480 Paroxysmal atrial fibrillation: Secondary | ICD-10-CM

## 2018-07-23 DIAGNOSIS — I340 Nonrheumatic mitral (valve) insufficiency: Secondary | ICD-10-CM

## 2018-07-23 LAB — POCT INR: INR: 1.2 — AB (ref 2.0–3.0)

## 2018-07-23 NOTE — Patient Instructions (Signed)
Take coumadin 3 tablets tonight, then START NEW DOSAGE of 2 tablets every day. Recheck in 2.5 weeks Call [phone] if you need to come off your Coumadin for a procedure

## 2018-07-23 NOTE — Telephone Encounter (Signed)
Error

## 2018-07-23 NOTE — Progress Notes (Signed)
Virtual Visit via Telephone Note   This visit type was conducted due to national recommendations for restrictions regarding the COVID-19 Pandemic (e.g. social distancing) in an effort to limit this patient's exposure and mitigate transmission in our community.  Due to her co-morbid illnesses, this patient is at least at moderate risk for complications without adequate follow up.  This format is felt to be most appropriate for this patient at this time.  The patient did not have access to video technology/had technical difficulties with video requiring transitioning to audio format only (telephone).  All issues noted in this document were discussed and addressed.  No physical exam could be performed with this format.  Please refer to the patient's chart for her  consent to telehealth for [facility].   Evaluation Performed:  Follow-up visit  Date:  07/23/2018   ID:  Bailey Bishop, DOB [birth year], MRN [id number]  Patient Location: Home Provider Location: Home  PCP:  Sharilyn Sites, MD  Cardiologist:  Kate Sable, MD  Electrophysiologist:  [name], MD   Chief Complaint:  Diastolic HF  History of Present Illness:    Bailey Bishop is a 78 y.o. female with chronic diastolic heart failure and permanent atrial fibrillation.  She is now very happy with the verapamil. She denies chest pain. Her primary issues today relate to seasonal allergies. She checks her vitals routinely and they've been normal. She does have some constipation.  The patient does not have symptoms concerning for COVID-19 infection (fever, chills, cough, or new shortness of breath).    Past Medical History:  Diagnosis Date  . Anxiety   . Arthritis   . Asthma   . Atrial fibrillation ([place])    a. s/p unsuccessful DCCV in 01/2017 and intolerant to Amiodarone --> Rate-control strategy pursued since  . Cancer [facility]) 2001   right breast  . Dyspnea   . GERD (gastroesophageal reflux disease)   .  Hypertension    Past Surgical History:  Procedure Laterality Date  . BIOPSY N/A 04/22/2018   Procedure: BIOPSY;  Surgeon: Aviva Signs, MD;  [facility];  Service: Gastroenterology;  Laterality: N/A;  . CARDIOVERSION N/A 02/18/2017   Procedure: CARDIOVERSION;  Surgeon: Josue Hector, MD;  Location: [facility] ENDOSCOPY;  Service: Cardiovascular;  Laterality: N/A;  . CARDIOVERSION N/A 12/04/2017   Procedure: CARDIOVERSION;  Surgeon: [name], MD;  Location: [place];  Service: Cardiovascular;  Laterality: N/A;  . CATARACT EXTRACTION W/PHACO  07/23/2011   Procedure: CATARACT EXTRACTION PHACO AND INTRAOCULAR LENS PLACEMENT ([facility]);  Surgeon: Williams Che, MD;  Location: [facility];  Service: Ophthalmology;  Laterality: Right;  CDE:9.96  . CHOLECYSTECTOMY N/A 02/14/2015   Procedure: LAPAROSCOPIC CHOLECYSTECTOMY;  Surgeon: Aviva Signs, MD;  Location: [facility] ORS;  Service: General;  Laterality: N/A;  . COLONOSCOPY N/A 04/22/2018   Procedure: COLONOSCOPY;  Surgeon: Aviva Signs, MD;  [facility];  Service: Gastroenterology;  Laterality: N/A;  . ESOPHAGOGASTRODUODENOSCOPY N/A 04/22/2018   Procedure: ESOPHAGOGASTRODUODENOSCOPY (EGD);  Surgeon: Aviva Signs, MD;  [facility];  Service: Gastroenterology;  Laterality: N/A;  . MASTECTOMY     bilateral mastectomy-right breast cancer-left taken by choice  . POLYPECTOMY  04/22/2018   Procedure: POLYPECTOMY;  Surgeon: Aviva Signs, MD;  [facility];  Service: Gastroenterology;;  . Valarie Merino  1995     Current Meds  Medication Sig  . acetaminophen (TYLENOL) 500 MG tablet Take 500-1,000 mg by mouth every 6 (six) hours as needed (pain.).  Marland Kitchen  ALPRAZolam (XANAX) 0.5 MG tablet Take 0.5 mg by mouth at bedtime.   Marland Kitchen BREO ELLIPTA 100-25 MCG/INH AEPB Inhale 1 puff into the lungs daily as needed (hay fever).   . fluticasone (FLONASE) 50 MCG/ACT nasal spray Place 2 sprays into both nostrils daily as needed for  allergies.   . furosemide (LASIX) 40 MG tablet Take 1 tablet (40 mg total) by mouth 2 (two) times daily.  Marland Kitchen loratadine (CLARITIN) 10 MG tablet Take 10 mg by mouth daily as needed for allergies.  Marland Kitchen omeprazole (PRILOSEC) 20 MG capsule Take 20 mg by mouth daily as needed (indigestion/stomach issues.).   Marland Kitchen potassium chloride SA (K-DUR,KLOR-CON) 20 MEQ tablet Take 1 tablet (20 mEq total) by mouth 2 (two) times daily.  . verapamil (CALAN-SR) 120 MG CR tablet Take 1 tablet (120 mg total) by mouth 2 (two) times daily.  Marland Kitchen warfarin (COUMADIN) 2.5 MG tablet Take 1 tablet daily except 2 tablets on Sundays (Patient taking differently: Take 2.5 mg by mouth every evening. )     Allergies:   Cardizem cd [diltiazem hcl er beads]; Amiodarone; Metoprolol tartrate; Sulfa antibiotics; Tomato; Carvedilol; and Chocolate   Social History   Tobacco Use  . Smoking status: Former Smoker    Packs/day: 1.00    Years: 28.00    Pack years: 28.00    Types: Cigarettes    Last attempt to quit: 07/17/1983    Years since quitting: 35.0  . Smokeless tobacco: Never Used  Substance Use Topics  . Alcohol use: No  . Drug use: No     Family Hx: The patient's family history includes Aneurysm in her father; Cancer in her mother, sister, and sister. There is no history of Anesthesia problems, Hypotension, Malignant hyperthermia, Pseudochol deficiency, or Colon cancer.  ROS:   Please see the history of present illness.     All other systems reviewed and are negative.   Prior CV studies:   The following studies were reviewed today:  NA  Labs/Other Tests and Data Reviewed:    EKG:  No ECG reviewed.  Recent Labs: 12/12/2017: Hemoglobin 11.5; Magnesium 2.1; Platelets 256 12/19/2017: BUN 20; Creatinine, Ser 1.30; Potassium 3.4; Sodium 140   Recent Lipid Panel Lab Results  Component Value Date/Time   CHOL 109 09/24/2016 04:38 AM   TRIG 66 09/24/2016 04:38 AM   HDL 34 (L) 09/24/2016 04:38 AM   CHOLHDL 3.2 09/24/2016  04:38 AM   LDLCALC 62 09/24/2016 04:38 AM    Wt Readings from Last 3 Encounters:  07/23/18 132 lb (59.9 kg)  04/23/18 151 lb (68.5 kg)  04/22/18 142 lb (64.4 kg)     Objective:    Vital Signs:  BP 118/63   Pulse 77   Ht 5\' 4"  (1.626 m)   Wt 132 lb (59.9 kg)   BMI 22.66 kg/m    VITAL SIGNS:  reviewed  ASSESSMENT & PLAN:    1.  Chronic diastolic heart failure/bilateral leg edema: Weight down considerably since visit on 04/23/18..    I will continue Lasix 40 mg every morning at 8 AM and every afternoon at 4 PM and supplemental potassium 20 mg twice daily.    2.  Permanent atrial fibrillation: Symptomatically stable. Currently on verapamil 120 mg twice daily with good heart rate control. I suspect it has led to bilateral leg edema but as it is the only medication she has tolerated, so I am not keen on stopping this.   Toprol-XL reportedly led to alopecia and she  quit taking it.  She failed both amiodarone and dofetilide.  She remains on warfarin for anticoagulation.  3.  Hypertension: Blood pressure is normal. No changes.  4.  Chronic kidney disease stage III: Needs BMET which I ordered at last visit.  5.  Mitral regurgitation: Moderate in severity by echocardiogram in July 2018.  I would consider repeating later this year.   COVID-19 Education: The signs and symptoms of COVID-19 were discussed with the patient and how to seek care for testing (follow up with PCP or arrange E-visit).  The importance of social distancing was discussed today.  Time:   Today, I have spent 15 minutes with the patient with telehealth technology discussing the above problems.     Medication Adjustments/Labs and Tests Ordered: Current medicines are reviewed at length with the patient today.  Concerns regarding medicines are outlined above.   Tests Ordered: No orders of the defined types were placed in this encounter.   Medication Changes: No orders of the defined types were placed in this  encounter.   Disposition:  Follow up in 6 month(s)  Signed, Kate Sable, MD  07/23/2018 8:50 AM    [facility]

## 2018-07-23 NOTE — Patient Instructions (Signed)

## 2018-07-28 ENCOUNTER — Encounter: Payer: Self-pay | Admitting: Family Medicine

## 2018-08-08 ENCOUNTER — Telehealth: Payer: Self-pay | Admitting: Pharmacist

## 2018-08-08 ENCOUNTER — Telehealth: Payer: Self-pay | Admitting: *Deleted

## 2018-08-08 NOTE — Telephone Encounter (Signed)

## 2018-08-08 NOTE — Telephone Encounter (Signed)
Called pt to discuss changing from warfarin to Lillie to help reduce office visits and exposure during COVID-19 pandemic, as well as for improved efficacy/safety profile. Pt is concerned with the cost of $47/month but will think about it.

## 2018-08-11 ENCOUNTER — Ambulatory Visit (INDEPENDENT_AMBULATORY_CARE_PROVIDER_SITE_OTHER): Payer: Medicare Other | Admitting: *Deleted

## 2018-08-11 ENCOUNTER — Other Ambulatory Visit: Payer: Self-pay

## 2018-08-11 DIAGNOSIS — I4821 Permanent atrial fibrillation: Secondary | ICD-10-CM

## 2018-08-11 DIAGNOSIS — I4891 Unspecified atrial fibrillation: Secondary | ICD-10-CM | POA: Diagnosis not present

## 2018-08-11 DIAGNOSIS — Z7901 Long term (current) use of anticoagulants: Secondary | ICD-10-CM | POA: Diagnosis not present

## 2018-08-11 LAB — POCT INR: INR: 2.7 (ref 2.0–3.0)

## 2018-08-11 NOTE — Patient Instructions (Signed)
Continue coumadin 2 tablets every day. Recheck in 3 weeks Call [phone] if you need to come off your Coumadin for a procedure

## 2018-08-13 ENCOUNTER — Telehealth: Payer: Self-pay | Admitting: Cardiovascular Disease

## 2018-08-13 NOTE — Telephone Encounter (Signed)
Pt wants to switch to Eliquis

## 2018-08-13 NOTE — Telephone Encounter (Signed)
LMOM for pt.  OK to change to Eliquis but need to check INR first.  Moved INR appt up to 08/25/18.  Will change pt over at that time.  LM for her to call me back.

## 2018-08-14 MED ORDER — APIXABAN 5 MG PO TABS: 5.0000 mg | ORAL_TABLET | Freq: Two times a day (BID) | ORAL | 6 refills | Status: DC

## 2018-08-14 NOTE — Telephone Encounter (Signed)
Pt called back.  Agrees with INR check on 6/1 and then changing to Eliquis.  Questions answered and Rx sent to [facility].

## 2018-08-23 DIAGNOSIS — H25812 Combined forms of age-related cataract, left eye: Secondary | ICD-10-CM | POA: Diagnosis not present

## 2018-08-25 ENCOUNTER — Ambulatory Visit (INDEPENDENT_AMBULATORY_CARE_PROVIDER_SITE_OTHER): Payer: Medicare Other | Admitting: *Deleted

## 2018-08-25 ENCOUNTER — Other Ambulatory Visit: Payer: Self-pay

## 2018-08-25 DIAGNOSIS — I4891 Unspecified atrial fibrillation: Secondary | ICD-10-CM | POA: Diagnosis not present

## 2018-08-25 DIAGNOSIS — Z7901 Long term (current) use of anticoagulants: Secondary | ICD-10-CM

## 2018-08-25 LAB — POCT INR: INR: 2.4 (ref 2.0–3.0)

## 2018-08-25 NOTE — Patient Instructions (Signed)
Pt here to change from warfarin to eliquis 5mg  bid. Hold coumadin tonight.  Start eliquis 5mg  twice daily evening of 08/26/18 Eliquis teaching has been done.  Pt verbalized understanding. 1 month eliquis follow up made.

## 2018-09-09 ENCOUNTER — Telehealth: Payer: Self-pay | Admitting: Cardiovascular Disease

## 2018-09-09 DIAGNOSIS — E7849 Other hyperlipidemia: Secondary | ICD-10-CM | POA: Diagnosis not present

## 2018-09-09 DIAGNOSIS — Z1389 Encounter for screening for other disorder: Secondary | ICD-10-CM | POA: Diagnosis not present

## 2018-09-09 DIAGNOSIS — Z6823 Body mass index (BMI) 23.0-23.9, adult: Secondary | ICD-10-CM | POA: Diagnosis not present

## 2018-09-09 DIAGNOSIS — N183 Chronic kidney disease, stage 3 (moderate): Secondary | ICD-10-CM | POA: Diagnosis not present

## 2018-09-09 DIAGNOSIS — I10 Essential (primary) hypertension: Secondary | ICD-10-CM | POA: Diagnosis not present

## 2018-09-09 DIAGNOSIS — J45909 Unspecified asthma, uncomplicated: Secondary | ICD-10-CM | POA: Diagnosis not present

## 2018-09-09 DIAGNOSIS — Z0001 Encounter for general adult medical examination with abnormal findings: Secondary | ICD-10-CM | POA: Diagnosis not present

## 2018-09-09 NOTE — Telephone Encounter (Signed)
Called pt., no answer. Left message for pt to return call.  

## 2018-09-09 NOTE — Telephone Encounter (Signed)
Pt's PCP Dr. Hilma Favors would like to take the pt off of her verapamil (CALAN-SR) 120 MG CR tablet [[id number]]  Because she is having chest congestion and that is one of the side effects   Pt also states she's almost out and will need a refill before Saturday.

## 2018-09-10 MED ORDER — VERAPAMIL HCL ER 120 MG PO TBCR: 120.0000 mg | EXTENDED_RELEASE_TABLET | Freq: Two times a day (BID) | ORAL | Status: DC

## 2018-09-10 NOTE — Telephone Encounter (Signed)
Pt states that she was having some SOB and Dr. Hilma Favors told her it could be coming from her verapamil. In looking back over her chart, I realized and reminded her that she has not been able to tolerate any other medications to help control her a-fib. She stated she would just like a refill sent in to [facility] Refill sent

## 2018-09-11 ENCOUNTER — Other Ambulatory Visit: Payer: Self-pay | Admitting: Cardiovascular Disease

## 2018-09-12 ENCOUNTER — Telehealth: Payer: Self-pay | Admitting: Cardiovascular Disease

## 2018-09-12 MED ORDER — VERAPAMIL HCL ER 120 MG PO TBCR: 120.0000 mg | EXTENDED_RELEASE_TABLET | Freq: Every day | ORAL | 3 refills | Status: DC

## 2018-09-12 NOTE — Telephone Encounter (Signed)
Patient would like to speak with nurse regarding dosage of Verapamil. / tg

## 2018-09-12 NOTE — Telephone Encounter (Signed)
Pt wanted to make sure we are aware she is taking verapamil 120 mg once daily. I informed her we are and it is in the system.

## 2018-10-02 DIAGNOSIS — H43813 Vitreous degeneration, bilateral: Secondary | ICD-10-CM | POA: Diagnosis not present

## 2018-10-02 DIAGNOSIS — H26491 Other secondary cataract, right eye: Secondary | ICD-10-CM | POA: Diagnosis not present

## 2018-10-02 DIAGNOSIS — H25812 Combined forms of age-related cataract, left eye: Secondary | ICD-10-CM | POA: Diagnosis not present

## 2018-10-02 DIAGNOSIS — Z961 Presence of intraocular lens: Secondary | ICD-10-CM | POA: Diagnosis not present

## 2018-10-06 ENCOUNTER — Encounter: Payer: Self-pay | Admitting: Cardiovascular Disease

## 2018-10-06 ENCOUNTER — Other Ambulatory Visit: Payer: Self-pay | Admitting: *Deleted

## 2018-10-06 ENCOUNTER — Telehealth (HOSPITAL_COMMUNITY): Payer: Self-pay | Admitting: *Deleted

## 2018-10-06 ENCOUNTER — Ambulatory Visit: Payer: Medicare Other | Admitting: *Deleted

## 2018-10-06 DIAGNOSIS — Z5181 Encounter for therapeutic drug level monitoring: Secondary | ICD-10-CM

## 2018-10-06 DIAGNOSIS — I4891 Unspecified atrial fibrillation: Secondary | ICD-10-CM

## 2018-10-06 DIAGNOSIS — Z7901 Long term (current) use of anticoagulants: Secondary | ICD-10-CM

## 2018-10-06 MED ORDER — CARVEDILOL 6.25 MG PO TABS: 6.2500 mg | ORAL_TABLET | Freq: Two times a day (BID) | ORAL | 3 refills | Status: DC

## 2018-10-06 NOTE — Telephone Encounter (Signed)
Patient feels she is not tolerating verapamil. She has already reduced the dose of verapamil to once a day but does not feel this has helped her. She would like Butch Penny to change her to something different for rate control. Per [name] NP - will stop verapamil and start coreg 6.25mg  BID follow up next week.

## 2018-10-06 NOTE — Progress Notes (Signed)
Pt was started on Eliquis 5mg  twice daily on 08/26/2018 for atrial fibrillation.    Pt denies nay adverse effects since starting Eliquis.  She has not had any bleeding, excessive bruising or GI upset.  Reviewed patients medication list.  Pt is not currently on any combined P-gp and strong CYP3A4 inhibitors/inducers (ketoconazole, traconazole, ritonavir, carbamazepine, phenytoin, rifampin, St. John's wort).  Reviewed labs from 09/09/18 ordered by PCP.  SCr 1.14, Weight 60kg, CrCl 37.90.  Dose is appropriate based on age, weight, and SCr.  Hgb and HCT:  13.1/37.6   A full discussion of the nature of anticoagulants has been carried out.  A benefit/risk analysis has been presented to the patient, so that they understand the justification for choosing anticoagulation with Eliquis at this time.  The need for compliance is stressed.  Pt is aware to take the medication twice daily.  Side effects of potential bleeding are discussed, including unusual colored urine or stools, coughing up blood or coffee ground emesis, nose bleeds or serious fall or head trauma.  Discussed signs and symptoms of stroke. The patient should avoid any OTC items containing aspirin or ibuprofen.  Avoid alcohol consumption.   Call if any signs of abnormal bleeding.  Discussed financial obligations and resolved any difficulty in obtaining medication.  Next lab test in 6 months.   Discussed labs with pt.  Will see pt back in 6 months for evaluation and repeat labs.

## 2018-10-07 ENCOUNTER — Encounter: Payer: Self-pay | Admitting: Cardiovascular Disease

## 2018-10-13 ENCOUNTER — Telehealth (HOSPITAL_COMMUNITY): Payer: Self-pay | Admitting: *Deleted

## 2018-10-13 NOTE — Telephone Encounter (Signed)
Pt cld to let us know that she was not able to tolerate the Coreg due to SOB and diarrhea.  She discontinued this Saturday 10/11/18 and restarted the Verapamil Sunday.  She will keep her upcoming appt to discuss med change.

## 2018-10-17 ENCOUNTER — Encounter (HOSPITAL_COMMUNITY): Payer: Self-pay | Admitting: Nurse Practitioner

## 2018-10-17 ENCOUNTER — Ambulatory Visit (HOSPITAL_COMMUNITY)
Admission: RE | Admit: 2018-10-17 | Discharge: 2018-10-17 | Disposition: A | Discharge status: Home / Self Care | Payer: Medicare Other | Source: Ambulatory Visit | Attending: Nurse Practitioner | Admitting: Nurse Practitioner

## 2018-10-17 ENCOUNTER — Other Ambulatory Visit: Payer: Self-pay

## 2018-10-17 VITALS — BP 136/74 | HR 97 | Ht 64.0 in | Wt 137.4 lb

## 2018-10-17 DIAGNOSIS — J45909 Unspecified asthma, uncomplicated: Secondary | ICD-10-CM | POA: Diagnosis not present

## 2018-10-17 DIAGNOSIS — Z7901 Long term (current) use of anticoagulants: Secondary | ICD-10-CM | POA: Insufficient documentation

## 2018-10-17 DIAGNOSIS — I4819 Other persistent atrial fibrillation: Secondary | ICD-10-CM | POA: Insufficient documentation

## 2018-10-17 DIAGNOSIS — K219 Gastro-esophageal reflux disease without esophagitis: Secondary | ICD-10-CM | POA: Insufficient documentation

## 2018-10-17 DIAGNOSIS — Z809 Family history of malignant neoplasm, unspecified: Secondary | ICD-10-CM | POA: Diagnosis not present

## 2018-10-17 DIAGNOSIS — I10 Essential (primary) hypertension: Secondary | ICD-10-CM | POA: Diagnosis not present

## 2018-10-17 DIAGNOSIS — Z79899 Other long term (current) drug therapy: Secondary | ICD-10-CM | POA: Insufficient documentation

## 2018-10-17 DIAGNOSIS — Z87891 Personal history of nicotine dependence: Secondary | ICD-10-CM | POA: Insufficient documentation

## 2018-10-17 DIAGNOSIS — F419 Anxiety disorder, unspecified: Secondary | ICD-10-CM | POA: Diagnosis not present

## 2018-10-17 DIAGNOSIS — I482 Chronic atrial fibrillation, unspecified: Secondary | ICD-10-CM

## 2018-10-17 DIAGNOSIS — Z853 Personal history of malignant neoplasm of breast: Secondary | ICD-10-CM | POA: Insufficient documentation

## 2018-10-17 NOTE — Progress Notes (Signed)
Primary Care Physician: Sharilyn Sites, MD Referring Physician: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 female with a h/o persistent afib, failing amiodarone for N/V last fall, did not tolerate Cardizem for dairrhea and higher BB dose caused shortness of breath. She had opted for rate control at that time.  She was recently evaluated by [name] for rhythm  control and pt is in the afib clinic today to be admitted for Tikosyn. She meets financial assistance and is applying to to get drug free. She has had therapeutic INR's x 4 weeks, as per reporting in Epic. She denies any benadryl use.  F/u in afib clinic, 10/11. Pt  recently wore a ZIO patch and unfortunately was in persistent afib. She has failed Tikosyn. Per [name] note prior to Tikosyn admit, if she failed Tikosyn, he would consider her for a PPM/AV nodal ablation.She does feel better since Tikosyn started though, she is better rate controlled.  F/u in afib clinic 10/17/18, as pt called in couple of weeks ago to report that she wanted to try something else as she thought verapamil was causing her hair to fall out. She has been intolerant of amiodarone, Tikosyn did not keep pt in rhythm.She has been intolerant of metoprolol and Cardizem for rate control. She has tolerated verapamil the best. I tried to see if she would tolerate carvedilol but she called in a few days later and c/o of diarrhea. It was stopped and verapamil was re started. She is rate controlled on that and since I know of nothing else to try, she is happy to continue with verapamil.  Today, she denies symptoms of palpitations, chest pain, shortness of breath, orthopnea, PND, lower extremity edema, dizziness, presyncope, syncope, or neurologic sequela. The patient is tolerating medications without difficulties and is otherwise without complaint today.   Past Medical History:  Diagnosis Date  . Anxiety   . Arthritis   . Asthma   . Atrial  fibrillation ([place])    a. s/p unsuccessful DCCV in 01/2017 and intolerant to Amiodarone --> Rate-control strategy pursued since  . Cancer [facility]) 2001   right breast  . Dyspnea   . GERD (gastroesophageal reflux disease)   . Hypertension    Past Surgical History:  Procedure Laterality Date  . BIOPSY N/A 04/22/2018   Procedure: BIOPSY;  Surgeon: Aviva Signs, MD;  [facility];  Service: Gastroenterology;  Laterality: N/A;  . CARDIOVERSION N/A 02/18/2017   Procedure: CARDIOVERSION;  Surgeon: Josue Hector, MD;  Location: [facility] ENDOSCOPY;  Service: Cardiovascular;  Laterality: N/A;  . CARDIOVERSION N/A 12/04/2017   Procedure: CARDIOVERSION;  Surgeon: [name], MD;  Location: [place];  Service: Cardiovascular;  Laterality: N/A;  . CATARACT EXTRACTION W/PHACO  07/23/2011   Procedure: CATARACT EXTRACTION PHACO AND INTRAOCULAR LENS PLACEMENT ([place]);  Surgeon: Williams Che, MD;  Location: [facility];  Service: Ophthalmology;  Laterality: Right;  CDE:9.96  . CHOLECYSTECTOMY N/A 02/14/2015   Procedure: LAPAROSCOPIC CHOLECYSTECTOMY;  Surgeon: Aviva Signs, MD;  Location: [facility];  Service: General;  Laterality: N/A;  . COLONOSCOPY N/A 04/22/2018   Procedure: COLONOSCOPY;  Surgeon: Aviva Signs, MD;  [facility];  Service: Gastroenterology;  Laterality: N/A;  . ESOPHAGOGASTRODUODENOSCOPY N/A 04/22/2018   Procedure: ESOPHAGOGASTRODUODENOSCOPY (EGD);  Surgeon: Aviva Signs, MD;  [facility];  Service: Gastroenterology;  Laterality: N/A;  . MASTECTOMY     bilateral mastectomy-right breast cancer-left taken by choice  . POLYPECTOMY  04/22/2018  Procedure: POLYPECTOMY;  Surgeon: Aviva Signs, MD;  [facility];  Service: Gastroenterology;;  . Valarie Merino  1995    Current Outpatient Medications  Medication Sig Dispense Refill  . acetaminophen (TYLENOL) 500 MG tablet Take 500-1,000 mg by mouth every 6 (six) hours as needed (pain.).    Marland Kitchen ALPRAZolam  (XANAX) 0.5 MG tablet Take 0.5 mg by mouth at bedtime.     Marland Kitchen apixaban (ELIQUIS) 5 MG TABS tablet Take 1 tablet (5 mg total) by mouth 2 (two) times daily. 60 tablet 6  . BREO ELLIPTA 100-25 MCG/INH AEPB Inhale 1 puff into the lungs daily as needed (hay fever).     . fluticasone (FLONASE) 50 MCG/ACT nasal spray Place 2 sprays into both nostrils daily as needed for allergies.     . furosemide (LASIX) 40 MG tablet Take 1 tablet (40 mg total) by mouth 2 (two) times daily. (Patient taking differently: Take 20-40 mg by mouth 2 (two) times daily. Alternating 2 tablets every other day and 1 tablet on the days in between.) 60 tablet 6  . loratadine (CLARITIN) 10 MG tablet Take 10 mg by mouth daily as needed for allergies.    Marland Kitchen omeprazole (PRILOSEC) 20 MG capsule Take 20 mg by mouth daily as needed (indigestion/stomach issues.).     Marland Kitchen potassium chloride (KLOR-CON) 20 MEQ packet Take by mouth 2 (two) times daily.    . verapamil (CALAN-SR) 120 MG CR tablet Take 120 mg by mouth at bedtime.     No current facility-administered medications for this encounter.     Allergies  Allergen Reactions  . Cardizem Cd [Diltiazem Hcl Er Beads] Diarrhea  . Amiodarone Nausea And Vomiting  . Metoprolol Tartrate Diarrhea  . Sulfa Antibiotics Nausea And Vomiting  . Tomato Other (See Comments)    Burns stomach  . Carvedilol Rash  . Chocolate Rash    Dark chocolate    Social History   Socioeconomic History  . Marital status: Married    Spouse name: Not on file  . Number of children: Not on file  . Years of education: Not on file  . Highest education level: Not on file  Occupational History  . Not on file  Social Needs  . Financial resource strain: Not on file  . Food insecurity    Worry: Not on file    Inability: Not on file  . Transportation needs    Medical: Not on file    Non-medical: Not on file  Tobacco Use  . Smoking status: Former Smoker    Packs/day: 1.00    Years: 28.00    Pack years: 28.00     Types: Cigarettes    Quit date: 07/17/1983    Years since quitting: 35.2  . Smokeless tobacco: Never Used  Substance and Sexual Activity  . Alcohol use: No  . Drug use: No  . Sexual activity: Never    Birth control/protection: Post-menopausal  Lifestyle  . Physical activity    Days per week: Not on file    Minutes per session: Not on file  . Stress: Not on file  Relationships  . Social Herbalist on phone: Not on file    Gets together: Not on file    Attends religious service: Not on file    Active member of club or organization: Not on file    Attends meetings of clubs or organizations: Not on file    Relationship status: Not on file  . Intimate partner  violence    Fear of current or ex partner: Not on file    Emotionally abused: Not on file    Physically abused: Not on file    Forced sexual activity: Not on file  Other Topics Concern  . Not on file  Social History Narrative  . Not on file    Family History  Problem Relation Age of Onset  . Cancer Mother   . Aneurysm Father   . Cancer Sister   . Cancer Sister   . Anesthesia problems Neg Hx   . Hypotension Neg Hx   . Malignant hyperthermia Neg Hx   . Pseudochol deficiency Neg Hx   . Colon cancer Neg Hx     ROS- All systems are reviewed and negative except as per the HPI above  Physical Exam: Vitals:   10/17/18 1039  BP: 136/74  Pulse: 97  SpO2: 94%  Weight: 62.3 kg  Height: 5\' 4"  (1.626 m)   Wt Readings from Last 3 Encounters:  10/17/18 62.3 kg  07/23/18 59.9 kg  04/23/18 68.5 kg    Labs: Lab Results  Component Value Date   NA 140 12/19/2017   K 3.4 (L) 12/19/2017   CL 107 12/19/2017   CO2 24 12/19/2017   GLUCOSE 103 (H) 12/19/2017   BUN 20 12/19/2017   CREATININE 1.30 (H) 12/19/2017   CALCIUM 8.8 (L) 12/19/2017   MG 2.1 12/12/2017   Lab Results  Component Value Date   INR 2.4 08/25/2018   Lab Results  Component Value Date   CHOL 109 09/24/2016   HDL 34 (L) 09/24/2016    LDLCALC 62 09/24/2016   TRIG 66 09/24/2016     GEN- The patient is well appearing, alert and oriented x 3 today.   Head- normocephalic, atraumatic Eyes-  Sclera clear, conjunctiva pink Ears- hearing intact Oropharynx- clear Neck- supple, no JVP Lymph- no cervical lymphadenopathy Lungs- Clear to ausculation bilaterally, normal work of breathing Heart- irregular rate and rhythm, no murmurs, rubs or gallops, PMI not laterally displaced GI- soft, NT, ND, + BS Extremities- no clubbing, cyanosis, or edema MS- no significant deformity or atrophy Skin- no rash or lesion Psych- euthymic mood, full affect Neuro- strength and sensation are intact  EKG- Afib at 97 bpm , qrs int 82 ms, qtc 459 ms INR's reviewed which have been therapeutic,starting 8/23, including today at 2.8 Zio patch-1. Atrial fib with a controlled VR and a RVR 2. NSVT 3. No prolonged pauses or bradycardia    Assessment and Plan: 1. Persistent  afib Failed amiodarone for GI complaints, intolerant to Cardizem 2/2 diarrhea and higher doses of BB 2/2 shortness of breath Failed Tikosyn to keep in SR She most recently  tried carvedilol thinking that verapamil was making of her hair fall out but did not tolerate due to diarrhea She is happy for now on verapamil 120 mg daily  At home her HR's are in there 70-80 range At one point if her afib could not be rate controlled it was discussed that she may be a  candidate for a PPM with AV nodal ablation  However, she is reasonably rate controlled with verapamil We discussed that she is not a candidate for afib ablation with her severely dilated left atrium, age and persistence of afib She is now off coumadin and is on eliquis 5 mg bid  F/u with [name] as scheduled afib clinic as needed   Butch Penny C. [place], [facility] [address]  [address] [place], [place] [address] [phone]

## 2018-10-28 DIAGNOSIS — J449 Chronic obstructive pulmonary disease, unspecified: Secondary | ICD-10-CM | POA: Diagnosis not present

## 2018-10-28 DIAGNOSIS — Z6823 Body mass index (BMI) 23.0-23.9, adult: Secondary | ICD-10-CM | POA: Diagnosis not present

## 2018-10-28 DIAGNOSIS — R0602 Shortness of breath: Secondary | ICD-10-CM | POA: Diagnosis not present

## 2018-10-30 NOTE — H&P (Signed)
Surgical History & Physical  Patient Name: Bailey Bishop DOB: 10-17-1940  Surgery: Cataract extraction with intraocular lens implant phacoemulsification; Left Eye  Surgeon: Baruch Goldmann MD Surgery Date:  11/13/2018 Pre-Op Date:  10/30/2018  HPI: A 26 Yr. old female patient referred for PCF OD and cataract OS per Dr. Madelin Headings 1. 1. The patient complains of difficulty when driving, which began 1 years ago. Both eyes are affected. The episode is gradual. The patient describes hazy symptoms affecting their eyes/vision. The condition's severity decreased since last visit. Symptoms occur when the patient is driving and reading. The complaint is associated with blurry vision. Patient has glare and halos with lights OS. This is negatively affecting the patient's quality of life. No irritations or discomforts. *H/O Amiodorone use -? length of time-d/c due to rxn HPI was performed by Baruch Goldmann .  Medical History: Cataracts PC IOL OD by Dr. Lawson Rad[facility] Deg[facility] General [facility]  Cancer Heart Problem High Blood Pressure Lung Problems  Review of Systems Negative Allergic/Immunologic Negative Cardiovascular Negative Constitutional Negative Ear, Nose, Mouth & Throat Negative Endocrine Negative Eyes Negative Gastrointestinal Negative Genitourinary Negative Hemotologic/Lymphatic Negative Integumentary Negative Musculoskeletal Negative Neurological Negative Psychiatry Negative Respiratory  Social   Former smoker  Medication Acetaminophinen, Albuterol, Alprazolam, Diltiazem, Fluticasone, Furosemide, Metoprolol, Omeprazole, Potassium chloride, Warfarin, Verapamil,   Sx/Procedures Cataract extraction OS,  Double Mastectomy,   Drug Allergies  Cardizem, Amiodarone, Sulfa, Carvedilol, Chocolate,   History & Physical: Heent:  Cataract, Left eye NECK: supple without bruits LUNGS: lungs clear to auscultation CV: regular rate and rhythm Abdomen: soft and  non-tender  Impression & Plan: Assessment: 1.  COMBINED FORMS AGE RELATED CATARACT; Left Eye (H25.812) 2.  INTRAOCULAR LENS IOL (Z96.1) 3.  VITREOUS DETACHMENT PVD; Both Eyes (H43.813) 4.  PCO; Right Eye (H26.491)  Plan: 1.  Cataract accounts for the patient's decreased vision. This visual impairment is not correctable with a tolerable change in glasses or contact lenses. Cataract surgery with an implantation of a new lens should significantly improve the visual and functional status of the patient. Discussed all risks, benefits, alternatives, and potential complications. Discussed the procedures and recovery. Patient desires to have surgery. A-scan ordered and performed today for intra-ocular lens calculations. The surgery will be performed in order to improve vision for driving, reading, and for eye examinations. Recommend phacoemulsification with intra-ocular lens. Left Eye. Surgery required to correct imbalance of vision. Dilates well - shugarcaine by protocol. 2.  PCO OD as below. 3.  Old Asymptomatic. RD precautions given. Patient to call with increase in flashing lights/floaters/dark curtain. Symptomatic. 4.  Symptomatic and visually significant. Accounts for patient's symptoms and is negatively affecting the patient's quality of life. Reviewed risks of the Yag laser including IOP spike, retinal detachment, and chronic inflammation. Patient wishes to proceed. Schedule patient for YAG capsulotomy at the 1 week visit after Phaco PCIOL OS.

## 2018-11-06 DIAGNOSIS — H2512 Age-related nuclear cataract, left eye: Secondary | ICD-10-CM | POA: Diagnosis not present

## 2018-11-11 ENCOUNTER — Other Ambulatory Visit: Payer: Self-pay

## 2018-11-11 ENCOUNTER — Encounter (HOSPITAL_COMMUNITY): Payer: Self-pay

## 2018-11-11 ENCOUNTER — Other Ambulatory Visit (HOSPITAL_COMMUNITY)
Admission: RE | Admit: 2018-11-11 | Discharge: 2018-11-11 | Disposition: A | Discharge status: Home / Self Care | Payer: Medicare Other | Source: Ambulatory Visit | Attending: Ophthalmology | Admitting: Ophthalmology

## 2018-11-11 ENCOUNTER — Encounter (HOSPITAL_COMMUNITY)
Admission: RE | Admit: 2018-11-11 | Discharge: 2018-11-11 | Disposition: A | Discharge status: Home / Self Care | Payer: Medicare Other | Source: Ambulatory Visit | Attending: Ophthalmology | Admitting: Ophthalmology

## 2018-11-11 DIAGNOSIS — Z20828 Contact with and (suspected) exposure to other viral communicable diseases: Secondary | ICD-10-CM | POA: Diagnosis not present

## 2018-11-11 DIAGNOSIS — Z01812 Encounter for preprocedural laboratory examination: Secondary | ICD-10-CM | POA: Diagnosis not present

## 2018-11-11 LAB — SARS CORONAVIRUS 2 (TAT 6-24 HRS): SARS Coronavirus 2: NEGATIVE

## 2018-11-13 ENCOUNTER — Ambulatory Visit (HOSPITAL_COMMUNITY)
Admission: RE | Admit: 2018-11-13 | Discharge: 2018-11-13 | Disposition: A | Discharge status: Home / Self Care | Payer: Medicare Other | Attending: Ophthalmology | Admitting: Ophthalmology

## 2018-11-13 ENCOUNTER — Ambulatory Visit (HOSPITAL_COMMUNITY): Payer: Medicare Other | Admitting: Anesthesiology

## 2018-11-13 ENCOUNTER — Encounter (HOSPITAL_COMMUNITY)
Admission: RE | Disposition: A | Discharge status: Home / Self Care | Payer: Self-pay | Source: Home / Self Care | Attending: Ophthalmology

## 2018-11-13 ENCOUNTER — Other Ambulatory Visit: Payer: Self-pay

## 2018-11-13 DIAGNOSIS — I10 Essential (primary) hypertension: Secondary | ICD-10-CM | POA: Diagnosis not present

## 2018-11-13 DIAGNOSIS — Z79899 Other long term (current) drug therapy: Secondary | ICD-10-CM | POA: Diagnosis not present

## 2018-11-13 DIAGNOSIS — H25812 Combined forms of age-related cataract, left eye: Secondary | ICD-10-CM | POA: Diagnosis not present

## 2018-11-13 DIAGNOSIS — H26491 Other secondary cataract, right eye: Secondary | ICD-10-CM | POA: Insufficient documentation

## 2018-11-13 DIAGNOSIS — H2512 Age-related nuclear cataract, left eye: Secondary | ICD-10-CM | POA: Diagnosis not present

## 2018-11-13 DIAGNOSIS — Z87891 Personal history of nicotine dependence: Secondary | ICD-10-CM | POA: Diagnosis not present

## 2018-11-13 DIAGNOSIS — J449 Chronic obstructive pulmonary disease, unspecified: Secondary | ICD-10-CM | POA: Diagnosis not present

## 2018-11-13 DIAGNOSIS — H43813 Vitreous degeneration, bilateral: Secondary | ICD-10-CM | POA: Insufficient documentation

## 2018-11-13 DIAGNOSIS — Z7901 Long term (current) use of anticoagulants: Secondary | ICD-10-CM | POA: Insufficient documentation

## 2018-11-13 DIAGNOSIS — I4891 Unspecified atrial fibrillation: Secondary | ICD-10-CM | POA: Insufficient documentation

## 2018-11-13 DIAGNOSIS — K219 Gastro-esophageal reflux disease without esophagitis: Secondary | ICD-10-CM | POA: Diagnosis not present

## 2018-11-13 DIAGNOSIS — I472 Ventricular tachycardia: Secondary | ICD-10-CM | POA: Insufficient documentation

## 2018-11-13 HISTORY — PX: CATARACT EXTRACTION W/PHACO: SHX586

## 2018-11-13 SURGERY — PHACOEMULSIFICATION, CATARACT, WITH IOL INSERTION
Anesthesia: Monitor Anesthesia Care | Site: Eye | Laterality: Left

## 2018-11-13 MED ORDER — PROVISC 10 MG/ML IO SOLN
INTRAOCULAR | Status: DC | PRN
  Administered 2018-11-13: 0.85 mL via INTRAOCULAR

## 2018-11-13 MED ORDER — LIDOCAINE HCL (PF) 1 % IJ SOLN
INTRAOCULAR | Status: DC | PRN
  Administered 2018-11-13: 1 mL via OPHTHALMIC

## 2018-11-13 MED ORDER — CYCLOPENTOLATE-PHENYLEPHRINE 0.2-1 % OP SOLN
1.0000 [drp] | OPHTHALMIC | Status: AC | PRN
  Administered 2018-11-13 (×3): 1 [drp] via OPHTHALMIC

## 2018-11-13 MED ORDER — TETRACAINE HCL 0.5 % OP SOLN
1.0000 [drp] | OPHTHALMIC | Status: AC | PRN
  Administered 2018-11-13 (×3): 1 [drp] via OPHTHALMIC

## 2018-11-13 MED ORDER — PHENYLEPHRINE HCL 2.5 % OP SOLN
1.0000 [drp] | OPHTHALMIC | Status: AC | PRN
  Administered 2018-11-13 (×3): 1 [drp] via OPHTHALMIC

## 2018-11-13 MED ORDER — EPINEPHRINE PF 1 MG/ML IJ SOLN
INTRAOCULAR | Status: DC | PRN
  Administered 2018-11-13: 12:00:00 500 mL

## 2018-11-13 MED ORDER — LIDOCAINE HCL 3.5 % OP GEL
1.0000 "application " | Freq: Once | OPHTHALMIC | Status: AC
  Administered 2018-11-13: 1 via OPHTHALMIC

## 2018-11-13 MED ORDER — SODIUM HYALURONATE 23 MG/ML IO SOLN
INTRAOCULAR | Status: DC | PRN
  Administered 2018-11-13: 0.6 mL via INTRAOCULAR

## 2018-11-13 MED ORDER — BSS IO SOLN
INTRAOCULAR | Status: DC | PRN
  Administered 2018-11-13: 15 mL

## 2018-11-13 MED ORDER — POVIDONE-IODINE 5 % OP SOLN
OPHTHALMIC | Status: DC | PRN
  Administered 2018-11-13: 1 via OPHTHALMIC

## 2018-11-13 SURGICAL SUPPLY — 15 items

## 2018-11-13 NOTE — Discharge Instructions (Signed)
Please discharge patient when stable, will follow up today with Dr. Vianey Caniglia at the [facility] office immediately following discharge.  Leave shield in place until visit.  All paperwork with discharge instructions will be given at the office. ° °

## 2018-11-13 NOTE — Anesthesia Postprocedure Evaluation (Signed)
Anesthesia Post Note  Patient: Bailey Bishop  Procedure(s) Performed: CATARACT EXTRACTION PHACO AND INTRAOCULAR LENS PLACEMENT (IOC) (Left Eye)  Patient location during evaluation: Short Stay Anesthesia Type: MAC Level of consciousness: awake and alert Pain management: pain level controlled Vital Signs Assessment: post-procedure vital signs reviewed and stable Respiratory status: spontaneous breathing Cardiovascular status: stable Postop Assessment: no apparent nausea or vomiting Anesthetic complications: no     Last Vitals:  Vitals:   11/13/18 1057 11/13/18 1200  BP: 136/66 (!) 144/57  Pulse: 91 92  Resp: 18 18  Temp: 36.7 C 37.1 C  SpO2: 94% 93%    Last Pain:  Vitals:   11/13/18 1200  TempSrc: Oral  PainSc: 0-No pain                 Baily Serpe

## 2018-11-13 NOTE — Op Note (Addendum)
Date of procedure: 11/13/18  Pre-operative diagnosis: Visually significant age-related cataract, Left Eye (H25.12)  Post-operative diagnosis: Visually significant age-related cataract, Left Eye  Procedure: Removal of cataract via phacoemulsification and insertion of intra-ocular lens [place]  +19.0D into the capsular bag of the Left Eye  Attending surgeon: Gerda Diss. Nastashia Gallo, MD, MA  Anesthesia: MAC, Topical [place]  Complications: None  Estimated Blood Loss: <79m (minimal)  Specimens: None  Implants: As above  Indications:  Visually significant age-related cataract, Left Eye  Procedure:  The patient was seen and identified in the pre-operative area. The operative eye was identified and dilated.  The operative eye was marked.  Topical anesthesia was administered to the operative eye.     The patient was then to the operative suite and placed in the supine position.  A timeout was performed confirming the patient, procedure to be performed, and all other relevant information.   The patient's face was prepped and draped in the usual fashion for intra-ocular surgery.  A lid speculum was placed into the operative eye and the surgical microscope moved into place and focused.  An inferotemporal paracentesis was created using a 20 gauge paracentesis blade.  Shugarcaine was injected into the anterior chamber.  Viscoelastic was injected into the anterior chamber.  A temporal clear-corneal main wound incision was created using a 2.462mmicrokeratome.  A continuous curvilinear capsulorrhexis was initiated using an irrigating cystitome and completed using capsulorrhexis forceps.  Hydrodissection and hydrodeliniation were performed.  Viscoelastic was injected into the anterior chamber.  A phacoemulsification handpiece and a chopper as a second instrument were used to remove the nucleus and epinucleus. The irrigation/aspiration handpiece was used to remove any remaining cortical  material.   The capsular bag was reinflated with viscoelastic, checked, and found to be intact.  The intraocular lens was inserted into the capsular bag and dialed into place using a Kuglen hook.  The irrigation/aspiration handpiece was used to remove any remaining viscoelastic.  The clear corneal wound and paracentesis wounds were then hydrated and checked with Weck-Cels to be watertight.  The lid-speculum and drape was removed, and the patient's face was cleaned with a wet and dry 4x4.  The patient was taken to the post-operative care unit in good condition, having tolerated the procedure well.  Post-Op Instructions: [facility] and will receive all other orders and instructions.

## 2018-11-13 NOTE — Anesthesia Procedure Notes (Signed)
Procedure Name: MAC Date/Time: 11/13/2018 11:36 AM Performed by: Vista Deck, CRNA Pre-anesthesia Checklist: Patient identified, Emergency Drugs available, Suction available, Timeout performed and Patient being monitored Patient Re-evaluated:Patient Re-evaluated prior to induction Oxygen Delivery Method: Nasal Cannula

## 2018-11-13 NOTE — Transfer of Care (Signed)
Immediate Anesthesia Transfer of Care Note  Patient: Bailey Bishop  Procedure(s) Performed: CATARACT EXTRACTION PHACO AND INTRAOCULAR LENS PLACEMENT (IOC) (Left Eye)  Patient Location: Short Stay  Anesthesia Type:MAC  Level of Consciousness: awake, alert  and patient cooperative  Airway & Oxygen Therapy: Patient Spontanous Breathing  Post-op Assessment: Report given to RN and Post -op Vital signs reviewed and stable  Post vital signs: Reviewed and stable  Last Vitals:  Vitals Value Taken Time  BP 144/57 11/13/2018  Temp 98.8 11/13/2018  Pulse 94 11/13/2018  Resp 22 11/13/2018  SpO2 93 11/13/2018    Last Pain:  Vitals:   11/13/18 1057  TempSrc: Oral  PainSc: 0-No pain      Patients Stated Pain Goal: 5 (90/24/09 [id number])  Complications: No apparent anesthesia complications

## 2018-11-13 NOTE — Interval H&P Note (Signed)
History and Physical Interval Note: The H and P was reviewed and updated. The patient was examined.  No changes were found after exam.  The surgical eye was marked.  11/13/2018 11:32 AM  Daryel Gerald  has presented today for surgery, with the diagnosis of nuclear cataract left eye.  The various methods of treatment have been discussed with the patient and family. After consideration of risks, benefits and other options for treatment, the patient has consented to  Procedure(s) with comments: CATARACT EXTRACTION PHACO AND INTRAOCULAR LENS PLACEMENT ([place]) (Left) - left as a surgical intervention.  The patient's history has been reviewed, patient examined, no change in status, stable for surgery.  I have reviewed the patient's chart and labs.  Questions were answered to the patient's satisfaction.     Baruch Goldmann

## 2018-11-13 NOTE — Anesthesia Preprocedure Evaluation (Signed)
Anesthesia Evaluation  Patient identified by MRN, date of birth, ID band Patient awake    Reviewed: Allergy & Precautions, NPO status , Patient's Chart, lab work & pertinent test results  Airway Mallampati: II  TM Distance: >3 FB Neck ROM: Full    Dental no notable dental hx. (+) Teeth Intact   Pulmonary shortness of breath and with exertion, asthma , COPD, former smoker,    Pulmonary exam normal breath sounds clear to auscultation       Cardiovascular Exercise Tolerance: Poor hypertension, Pt. on medications Normal cardiovascular exam+ dysrhythmias Atrial Fibrillation and Ventricular Tachycardia II Rhythm:Regular Rate:Normal     Neuro/Psych Anxiety negative neurological ROS  negative psych ROS   GI/Hepatic Neg liver ROS, GERD  Medicated and Controlled,  Endo/Other  negative endocrine ROS  Renal/GU negative Renal ROS  negative genitourinary   Musculoskeletal negative musculoskeletal ROS (+)   Abdominal   Peds negative pediatric ROS (+)  Hematology negative hematology ROS (+)   Anesthesia Other Findings   Reproductive/Obstetrics negative OB ROS                             Anesthesia Physical Anesthesia Plan  ASA: III  Anesthesia Plan: MAC   Post-op Pain Management:    Induction: Intravenous  PONV Risk Score and Plan: 3 and Treatment may vary due to age or medical condition and TIVA  Airway Management Planned: Simple Face Mask  Additional Equipment:   Intra-op Plan:   Post-operative Plan:   Informed Consent: I have reviewed the patients History and Physical, chart, labs and discussed the procedure including the risks, benefits and alternatives for the proposed anesthesia with the patient or authorized representative who has indicated his/her understanding and acceptance.     Dental advisory given  Plan Discussed with: CRNA  Anesthesia Plan Comments: (Plan Full PPE use   Plan MAC d/w pt -WTP with same after Q&A)        Anesthesia Quick Evaluation

## 2018-11-14 ENCOUNTER — Encounter (HOSPITAL_COMMUNITY): Payer: Self-pay | Admitting: Ophthalmology

## 2018-11-20 DIAGNOSIS — H26491 Other secondary cataract, right eye: Secondary | ICD-10-CM | POA: Diagnosis not present

## 2018-12-04 DIAGNOSIS — Z23 Encounter for immunization: Secondary | ICD-10-CM | POA: Diagnosis not present

## 2018-12-06 ENCOUNTER — Emergency Department (HOSPITAL_COMMUNITY)
Admission: EM | Admit: 2018-12-06 | Discharge: 2018-12-06 | Disposition: A | Discharge status: Home / Self Care | Payer: Medicare Other | Attending: Emergency Medicine | Admitting: Emergency Medicine

## 2018-12-06 ENCOUNTER — Encounter (HOSPITAL_COMMUNITY): Payer: Self-pay

## 2018-12-06 ENCOUNTER — Other Ambulatory Visit: Payer: Self-pay

## 2018-12-06 ENCOUNTER — Emergency Department (HOSPITAL_COMMUNITY): Payer: Medicare Other

## 2018-12-06 DIAGNOSIS — Z7901 Long term (current) use of anticoagulants: Secondary | ICD-10-CM | POA: Insufficient documentation

## 2018-12-06 DIAGNOSIS — N3091 Cystitis, unspecified with hematuria: Secondary | ICD-10-CM | POA: Diagnosis not present

## 2018-12-06 DIAGNOSIS — Z87891 Personal history of nicotine dependence: Secondary | ICD-10-CM | POA: Diagnosis not present

## 2018-12-06 DIAGNOSIS — I129 Hypertensive chronic kidney disease with stage 1 through stage 4 chronic kidney disease, or unspecified chronic kidney disease: Secondary | ICD-10-CM | POA: Insufficient documentation

## 2018-12-06 DIAGNOSIS — Z853 Personal history of malignant neoplasm of breast: Secondary | ICD-10-CM | POA: Insufficient documentation

## 2018-12-06 DIAGNOSIS — Z79899 Other long term (current) drug therapy: Secondary | ICD-10-CM | POA: Diagnosis not present

## 2018-12-06 DIAGNOSIS — R319 Hematuria, unspecified: Secondary | ICD-10-CM | POA: Diagnosis present

## 2018-12-06 DIAGNOSIS — N182 Chronic kidney disease, stage 2 (mild): Secondary | ICD-10-CM | POA: Insufficient documentation

## 2018-12-06 DIAGNOSIS — N3090 Cystitis, unspecified without hematuria: Secondary | ICD-10-CM | POA: Diagnosis not present

## 2018-12-06 DIAGNOSIS — J449 Chronic obstructive pulmonary disease, unspecified: Secondary | ICD-10-CM | POA: Diagnosis not present

## 2018-12-06 DIAGNOSIS — J45909 Unspecified asthma, uncomplicated: Secondary | ICD-10-CM | POA: Diagnosis not present

## 2018-12-06 DIAGNOSIS — I4891 Unspecified atrial fibrillation: Secondary | ICD-10-CM | POA: Insufficient documentation

## 2018-12-06 DIAGNOSIS — K5730 Diverticulosis of large intestine without perforation or abscess without bleeding: Secondary | ICD-10-CM | POA: Diagnosis not present

## 2018-12-06 LAB — URINALYSIS, ROUTINE W REFLEX MICROSCOPIC
Bacteria, UA: NONE SEEN
Bilirubin Urine: NEGATIVE
Glucose, UA: 50 mg/dL — AB
Ketones, ur: NEGATIVE mg/dL
Nitrite: NEGATIVE
Protein, ur: 100 mg/dL — AB
RBC / HPF: >50 RBC/hpf — ABNORMAL HIGH (ref 0–5)
Specific Gravity, Urine: 1.023 (ref 1.005–1.030)
WBC, UA: >50 WBC/hpf — ABNORMAL HIGH (ref 0–5)
pH: 5 (ref 5.0–8.0)

## 2018-12-06 LAB — BASIC METABOLIC PANEL
Anion gap: 8 (ref 5–15)
BUN: 19 mg/dL (ref 8–23)
CO2: 25 mmol/L (ref 22–32)
Calcium: 8.9 mg/dL (ref 8.9–10.3)
Chloride: 102 mmol/L (ref 98–111)
Creatinine, Ser: 1.11 mg/dL — ABNORMAL HIGH (ref 0.44–1.00)
GFR calc Af Amer: 54 mL/min — ABNORMAL LOW (ref >60)
GFR calc non Af Amer: 47 mL/min — ABNORMAL LOW (ref >60)
Glucose, Bld: 97 mg/dL (ref 70–99)
Potassium: 3.4 mmol/L — ABNORMAL LOW (ref 3.5–5.1)
Sodium: 135 mmol/L (ref 135–145)

## 2018-12-06 LAB — CBC WITH DIFFERENTIAL/PLATELET
Abs Immature Granulocytes: 0.02 10*3/uL (ref 0.00–0.07)
Basophils Absolute: 0 10*3/uL (ref 0.0–0.1)
Basophils Relative: 0 %
Eosinophils Absolute: 0.6 10*3/uL — ABNORMAL HIGH (ref 0.0–0.5)
Eosinophils Relative: 7 %
HCT: 39.6 % (ref 36.0–46.0)
Hemoglobin: 12.6 g/dL (ref 12.0–15.0)
Immature Granulocytes: 0 %
Lymphocytes Relative: 16 %
Lymphs Abs: 1.3 10*3/uL (ref 0.7–4.0)
MCH: 31.3 pg (ref 26.0–34.0)
MCHC: 31.8 g/dL (ref 30.0–36.0)
MCV: 98.3 fL (ref 80.0–100.0)
Monocytes Absolute: 0.7 10*3/uL (ref 0.1–1.0)
Monocytes Relative: 8 %
Neutro Abs: 5.8 10*3/uL (ref 1.7–7.7)
Neutrophils Relative %: 69 %
Platelets: 169 10*3/uL (ref 150–400)
RBC: 4.03 MIL/uL (ref 3.87–5.11)
RDW: 13 % (ref 11.5–15.5)
WBC: 8.4 10*3/uL (ref 4.0–10.5)
nRBC: 0 % (ref 0.0–0.2)

## 2018-12-06 MED ORDER — CEPHALEXIN 500 MG PO CAPS: 500.0000 mg | ORAL_CAPSULE | Freq: Four times a day (QID) | ORAL | 0 refills | Status: DC

## 2018-12-06 MED ORDER — VERAPAMIL HCL ER 120 MG PO CP24
120.0000 mg | ORAL_CAPSULE | Freq: Every day | ORAL | Status: DC
  Filled 2018-12-06: qty 1

## 2018-12-06 MED ORDER — VERAPAMIL HCL ER 120 MG PO TBCR
120.0000 mg | EXTENDED_RELEASE_TABLET | ORAL | Status: DC
  Filled 2018-12-06: qty 1

## 2018-12-06 MED ORDER — APIXABAN 5 MG PO TABS
5.0000 mg | ORAL_TABLET | ORAL | Status: DC
  Filled 2018-12-06: qty 1

## 2018-12-06 NOTE — ED Notes (Signed)
Go in room to administered ordered home medication. Pt is getting dressed and stated," we will just followup with our family docotr." EDP aware and discussed care plan with client. EDP processing discharge instructions.

## 2018-12-06 NOTE — ED Notes (Signed)
Lab Called. There was not enough Urine sent for sample. Another Order for UA is requested.

## 2018-12-06 NOTE — Discharge Instructions (Signed)
Take the prescription as directed.  Call your regular medical doctor on Monday to schedule a follow up appointment within the next 3 days. Your family doctor will need to re-check your urinalysis and urine culture after your antibiotics are completed. If your urinary symptoms or bleeding persists, you will need to follow up with the Urologist.  Return to the Emergency Department immediately sooner if worsening.

## 2018-12-06 NOTE — ED Provider Notes (Signed)
[facility] EMERGENCY DEPARTMENT Provider Note   CSN: [id number] Arrival date & time: 12/06/18  2694     History   Chief Complaint Chief Complaint  Patient presents with   Hematuria    HPI Bailey Bishop is a 78 y.o. female.     HPI  Pt was seen at 0830. Per pt, c/o gradual onset and persistence of constant dysuria, urinary frequency and urgency for the past 3 to 4 days. States this morning her "urine turned pink, then red." Has been associated with suprapubic "pressure." Denies flank pain, no fevers, no abd pain, no N/V/D, no vaginal bleeding/discharge, no rash.   Past Medical History:  Diagnosis Date   Anxiety    Arthritis    Asthma    Atrial fibrillation ([place])    a. s/p unsuccessful DCCV in 01/2017 and intolerant to Amiodarone --> Rate-control strategy pursued since   Cancer [name]) 2001   right breast   Dyspnea    GERD (gastroesophageal reflux disease)    Hypertension     Patient Active Problem List   Diagnosis Date Noted   Iron deficiency anemia    Benign neoplasm of transverse colon    Diverticulosis of large intestine without diverticulitis    Visit for monitoring Tikosyn therapy 12/02/2017   Atrial fibrillation ([place]) 03/31/2017   Persistent atrial fibrillation    Anticoagulated 10/03/2016   COPD (chronic obstructive pulmonary disease) ([place]) 10/03/2016   Hypokalemia 09/24/2016   Asthma in adult without complication 85/46/2703   CKD (chronic kidney disease), stage II 09/23/2016   Hyperglycemia 09/23/2016   Anxiety    GERD (gastroesophageal reflux disease)    Shortness of breath    Essential hypertension    Status asthmaticus 07/07/2014    Past Surgical History:  Procedure Laterality Date   BIOPSY N/A 04/22/2018   Procedure: BIOPSY;  Surgeon: Aviva Signs, MD;  Location: [facility] ENDO [facility];  Service: Gastroenterology;  Laterality: N/A;   CARDIOVERSION N/A 02/18/2017   Procedure: CARDIOVERSION;  Surgeon: Josue Hector, MD;   Location: [facility] ENDOSCOPY;  Service: Cardiovascular;  Laterality: N/A;   CARDIOVERSION N/A 12/04/2017   Procedure: CARDIOVERSION;  Surgeon: [name], MD;  Location: [place];  Service: Cardiovascular;  Laterality: N/A;   CATARACT EXTRACTION W/PHACO  07/23/2011   Procedure: CATARACT EXTRACTION PHACO AND INTRAOCULAR LENS PLACEMENT ([place]);  Surgeon: Williams Che, MD;  Location: [facility] ORS;  Service: Ophthalmology;  Laterality: Right;  CDE:9.96   CATARACT EXTRACTION W/PHACO Left 11/13/2018   Procedure: CATARACT EXTRACTION PHACO AND INTRAOCULAR LENS PLACEMENT (IOC);  Surgeon: Baruch Goldmann, MD;  Location: [facility] ORS;  Service: Ophthalmology;  Laterality: Left;  left, CDE: 7.44   CHOLECYSTECTOMY N/A 02/14/2015   Procedure: LAPAROSCOPIC CHOLECYSTECTOMY;  Surgeon: Aviva Signs, MD;  Location: [facility] ORS;  Service: General;  Laterality: N/A;   COLONOSCOPY N/A 04/22/2018   Procedure: COLONOSCOPY;  Surgeon: Aviva Signs, MD;  Location: [facility];  Service: Gastroenterology;  Laterality: N/A;   ESOPHAGOGASTRODUODENOSCOPY N/A 04/22/2018   Procedure: ESOPHAGOGASTRODUODENOSCOPY (EGD);  Surgeon: Aviva Signs, MD;  Location: [facility];  Service: Gastroenterology;  Laterality: N/A;   MASTECTOMY     bilateral mastectomy-right breast cancer-left taken by choice   POLYPECTOMY  04/22/2018   Procedure: POLYPECTOMY;  Surgeon: Aviva Signs, MD;  Location: [facility];  Service: Gastroenterology;;   SEPTOPLASTY  1995     OB History   No obstetric history on file.      Home Medications    Prior to Admission medications  Medication Sig Start Date End Date Taking? Authorizing Provider  acetaminophen (TYLENOL) 650 MG CR tablet Take 650 mg by mouth every 8 (eight) hours as needed for pain.    [provider]  albuterol (VENTOLIN HFA) 108 (90 Base) MCG/ACT inhaler Inhale 1-2 puffs into the lungs every 6 (six) hours as needed for wheezing or shortness of breath.    [provider]   ALPRAZolam Duanne Moron) 0.5 MG tablet Take 0.5 mg by mouth at bedtime as needed for anxiety.  06/18/14   [provider]  apixaban (ELIQUIS) 5 MG TABS tablet Take 1 tablet (5 mg total) by mouth 2 (two) times daily. 08/25/18   Herminio Commons, MD  cephALEXin (KEFLEX) 500 MG capsule Take 1 capsule (500 mg total) by mouth 4 (four) times daily. 12/06/18   Francine Graven, DO  ergocalciferol (VITAMIN D2) 1.25 MG (50000 UT) capsule Take 50,000 Units by mouth every Sunday.    [provider]  furosemide (LASIX) 40 MG tablet Take 1 tablet (40 mg total) by mouth 2 (two) times daily. 04/23/18   Herminio Commons, MD  loperamide (IMODIUM) 2 MG capsule Take 1 capsule by mouth every 4 (four) hours as needed. 11/03/18   [provider]  omeprazole (PRILOSEC) 20 MG capsule Take 20 mg by mouth daily as needed (for indigestion/stomach issues.).  02/14/16   [provider]  potassium chloride (KLOR-CON) 20 MEQ packet Take 20 mEq by mouth 2 (two) times daily.     [provider]  verapamil (CALAN-SR) 120 MG CR tablet Take 120 mg by mouth daily.     [provider]    Family History Family History  Problem Relation Age of Onset   Cancer Mother    Aneurysm Father    Cancer Sister    Cancer Sister    Anesthesia problems Neg Hx    Hypotension Neg Hx    Malignant hyperthermia Neg Hx    Pseudochol deficiency Neg Hx    Colon cancer Neg Hx     Social History Social History   Tobacco Use   Smoking status: Former Smoker    Packs/day: 1.00    Years: 28.00    Pack years: 28.00    Types: Cigarettes    Quit date: 07/17/1983    Years since quitting: 35.4   Smokeless tobacco: Never Used  Substance Use Topics   Alcohol use: No   Drug use: No     Allergies   Cardizem cd [diltiazem hcl er beads], Amiodarone, Metoprolol tartrate, Sulfa antibiotics, Tomato, Carvedilol, and Chocolate   Review of Systems Review of Systems ROS: Statement: All  systems negative except as marked or noted in the HPI; Constitutional: Negative for fever and chills. ; ; Eyes: Negative for eye pain, redness and discharge. ; ; ENMT: Negative for ear pain, hoarseness, nasal congestion, sinus pressure and sore throat. ; ; Cardiovascular: Negative for chest pain, palpitations, diaphoresis, dyspnea and peripheral edema. ; ; Respiratory: Negative for cough, wheezing and stridor. ; ; Gastrointestinal: Negative for nausea, vomiting, diarrhea, abdominal pain, blood in stool, hematemesis, jaundice and rectal bleeding. . ; ; Genitourinary: Negative for flank pain and +dysuria, hematuria. ; ; GYN:  No pelvic pain, no vaginal bleeding, no vaginal discharge, no vulvar pain. ;; Musculoskeletal: Negative for back pain and neck pain. Negative for swelling and trauma.; ; Skin: Negative for pruritus, rash, abrasions, blisters, bruising and skin lesion.; ; Neuro: Negative for headache, lightheadedness and neck stiffness. Negative for weakness, altered level  of consciousness, altered mental status, extremity weakness, paresthesias, involuntary movement, seizure and syncope.       Physical Exam Updated Vital Signs BP (!) 146/87    Pulse (!) 103    Temp 98.2 F (36.8 C) (Oral)    Resp (!) 21    Ht 5\' 4"  (1.626 m)    Wt 59.9 kg    SpO2 96%    BMI 22.66 kg/m   Physical Exam 0835: Physical examination:  Nursing notes reviewed; Vital signs and O2 SAT reviewed;  Constitutional: Well developed, Well nourished, Well hydrated, In no acute distress; Head:  Normocephalic, atraumatic; Eyes: EOMI, PERRL, No scleral icterus; ENMT: Mouth and pharynx normal, Mucous membranes moist; Neck: Supple, Full range of motion, No lymphadenopathy; Cardiovascular: Regular rate and rhythm, No gallop; Respiratory: Breath sounds clear & equal bilaterally, No wheezes.  Speaking full sentences with ease, Normal respiratory effort/excursion; Chest: Nontender, Movement normal; Abdomen: Soft, Nontender, Nondistended,  Normal bowel sounds; Genitourinary: No CVA tenderness; Extremities: Peripheral pulses normal, No tenderness, No edema, No calf edema or asymmetry.; Neuro: AA&Ox3, Major CN grossly intact.  Speech clear. No gross focal motor or sensory deficits in extremities. Climbs on and off stretcher easily by herself. Gait steady..; Skin: Color normal, Warm, Dry.     ED Treatments / Results  Labs (all labs ordered are listed, but only abnormal results are displayed)   EKG None  Radiology   Procedures Procedures (including critical care time)  Medications Ordered in ED Medications - No data to display   Initial Impression / Assessment and Plan / ED Course  I have reviewed the triage vital signs and the nursing notes.  Pertinent labs & imaging results that were available during my care of the patient were reviewed by me and considered in my medical decision making (see chart for details).     MDM Reviewed: previous chart, nursing note and vitals Reviewed previous: labs Interpretation: labs and CT scan    Results for orders placed or performed during the hospital encounter of 12/06/18  Urinalysis, Routine w reflex microscopic  Result Value Ref Range   Color, Urine RED (A) YELLOW   APPearance CLOUDY (A) CLEAR   Specific Gravity, Urine 1.023 1.005 - 1.030   pH 5.0 5.0 - 8.0   Glucose, UA 50 (A) NEGATIVE mg/dL   Hgb urine dipstick LARGE (A) NEGATIVE   Bilirubin Urine NEGATIVE NEGATIVE   Ketones, ur NEGATIVE NEGATIVE mg/dL   Protein, ur 100 (A) NEGATIVE mg/dL   Nitrite NEGATIVE NEGATIVE   Leukocytes,Ua SMALL (A) NEGATIVE   RBC / HPF >50 (H) 0 - 5 RBC/hpf   WBC, UA >50 (H) 0 - 5 WBC/hpf   Bacteria, UA NONE SEEN NONE SEEN   WBC Clumps PRESENT   Basic metabolic panel  Result Value Ref Range   Sodium 135 135 - 145 mmol/L   Potassium 3.4 (L) 3.5 - 5.1 mmol/L   Chloride 102 98 - 111 mmol/L   CO2 25 22 - 32 mmol/L   Glucose, Bld 97 70 - 99 mg/dL   BUN 19 8 - 23 mg/dL   Creatinine,  Ser 1.11 (H) 0.44 - 1.00 mg/dL   Calcium 8.9 8.9 - 10.3 mg/dL   GFR calc non Af Amer 47 (L) >60 mL/min   GFR calc Af Amer 54 (L) >60 mL/min   Anion gap 8 5 - 15  CBC with Differential  Result Value Ref Range   WBC 8.4 4.0 - 10.5 K/uL   RBC 4.03  3.87 - 5.11 MIL/uL   Hemoglobin 12.6 12.0 - 15.0 g/dL   HCT 39.6 36.0 - 46.0 %   MCV 98.3 80.0 - 100.0 fL   MCH 31.3 26.0 - 34.0 pg   MCHC 31.8 30.0 - 36.0 g/dL   RDW 13.0 11.5 - 15.5 %   Platelets 169 150 - 400 K/uL   nRBC 0.0 0.0 - 0.2 %   Neutrophils Relative % 69 %   Neutro Abs 5.8 1.7 - 7.7 K/uL   Lymphocytes Relative 16 %   Lymphs Abs 1.3 0.7 - 4.0 K/uL   Monocytes Relative 8 %   Monocytes Absolute 0.7 0.1 - 1.0 K/uL   Eosinophils Relative 7 %   Eosinophils Absolute 0.6 (H) 0.0 - 0.5 K/uL   Basophils Relative 0 %   Basophils Absolute 0.0 0.0 - 0.1 K/uL   Immature Granulocytes 0 %   Abs Immature Granulocytes 0.02 0.00 - 0.07 K/uL   Ct Renal Stone Study Result Date: 12/06/2018 CLINICAL DATA:  78 year old female with history of hematuria. Frequency and urgency of urination for the past 3 days. EXAM: CT ABDOMEN AND PELVIS WITHOUT CONTRAST TECHNIQUE: Multidetector CT imaging of the abdomen and pelvis was performed following the standard protocol without IV contrast. COMPARISON:  CT the abdomen and pelvis 11/01/2016. FINDINGS: Lower chest: Mild cardiomegaly. Aortic atherosclerosis. Calcifications of the mitral annulus. Scarring in the visualize lung bases. Hepatobiliary: Subcentimeter low-attenuation lesions in the liver, too small to characterize, but similar to the prior study and statistically likely to represent tiny cysts. Calcified granuloma in the right lobe of the liver incidentally noted. No other new suspicious appearing cystic or solid hepatic lesions are confidently identified on today's noncontrast CT examination. Liver has a slightly shrunken appearance and nodular contour, suggestive of underlying cirrhosis. Status post  cholecystectomy. Pancreas: No definite pancreatic mass or peripancreatic fluid collections or inflammatory changes are noted on today's noncontrast CT examination. Spleen: Unremarkable. Adrenals/Urinary Tract: No calcifications are identified within the collecting system of either kidney, along the course of either ureter, or within the lumen of the urinary bladder. 3.7 cm low-attenuation lesion in the lower pole the left kidney, incompletely characterized on today's non-contrast CT examination, but similar to the prior study and statistically likely to represent a cyst. Right kidney and bilateral adrenal glands are normal in appearance. No hydroureteronephrosis. Urinary bladder is normal in appearance. Stomach/Bowel: Unenhanced appearance of the stomach is normal. No pathologic dilatation of small bowel or colon. A few scattered colonic diverticulae are noted, without surrounding inflammatory changes to suggest an acute diverticulitis at this time. Normal appendix. Vascular/Lymphatic: Aortic atherosclerosis. No lymphadenopathy noted in the abdomen. Reproductive: Uterus and ovaries are unremarkable in appearance. Other: No significant volume of ascites.  No pneumoperitoneum. Musculoskeletal: There are no aggressive appearing lytic or blastic lesions noted in the visualized portions of the skeleton. IMPRESSION: 1. No explanation for the patient's history of hematuria. Specifically, no urinary tract calculi no findings of urinary tract obstruction. 2. 3.7 cm low-attenuation lesion in the lower pole the left kidney incompletely characterized on today's non-contrast CT examination, but similar to the prior examination from 11/01/2016 and statistically likely to represent a cyst. Should the patient's hematuria persist or worsen, this could be definitively characterized with abdominal MRI with and without IV gadolinium. 3. Morphologic changes in the liver suggesting early cirrhosis. 4. Colonic diverticulosis without  evidence of acute diverticulitis at this time. 5. Aortic atherosclerosis. Electronically Signed   By: Vinnie Langton M.D.   On: 12/06/2018  10:53    Bailey Bishop was evaluated in Emergency Department on 12/06/2018 for the symptoms described in the history of present illness. She was evaluated in the context of the global COVID-19 pandemic, which necessitated consideration that the patient might be at risk for infection with the SARS-CoV-2 virus that causes COVID-19. Institutional protocols and algorithms that pertain to the evaluation of patients at risk for COVID-19 are in a state of rapid change based on information released by regulatory bodies including the CDC and federal and state organizations. These policies and algorithms were followed during the patient's care in the ED.   1150:  H/H and BUN/Cr per baseline. Pt now states her "urine is clear" since she's been in the ED.  UC pending; plan to tx abx, f/u Uro vs PMD. T/C returned from [place] Dr. Louis Meckel, case discussed, including:  HPI, pertinent PM/SHx, VS/PE, dx testing, ED course and treatment:  Agrees with abx and UC, states have pt f/u with PMD 1st and after abx (recheck UA/UC), if symptoms or bleeding persist, then have pt f/u with Uro MD. Dx and testing, as well as d/w Uro MD, d/w pt and family.  Questions answered.  Verb understanding, agreeable to d/c home with outpt f/u.     Final Clinical Impressions(s) / ED Diagnoses   Final diagnoses:  Hemorrhagic cystitis    ED Discharge Orders         Ordered    cephALEXin (KEFLEX) 500 MG capsule  4 times daily     12/06/18 [place], [place], DO 12/09/18 1055

## 2018-12-06 NOTE — ED Notes (Signed)
Pt in CT at this time.

## 2018-12-06 NOTE — ED Notes (Signed)
EDP talked with pt and pt family, reported was waiting for urologist to call back. Pt spouse stated,"its an emergency she needs to take her medicine." pt states "I need my verapimil and eliquis." EDP reported to administer one time dose of these medications while in ED.

## 2018-12-06 NOTE — ED Notes (Signed)
Pt husband come out to nurses station x2. Reporting "its an emergency my wife needs to take her medicine and we need to go. Call the doctor and have them set up an appointment." Attempted to console pt and there family. No progress made at this time. EDP aware and in to talk with pt and pt family.

## 2018-12-06 NOTE — ED Triage Notes (Signed)
Pt states she has had frequency and urgency with urination x 3 days, this am with blood in her urine.  Pt is on eliquis for afib.  Pain in lower abd with spasms.

## 2018-12-07 LAB — URINE CULTURE: Culture: <10000 — AB

## 2018-12-24 DIAGNOSIS — N183 Chronic kidney disease, stage 3 (moderate): Secondary | ICD-10-CM | POA: Diagnosis not present

## 2018-12-24 DIAGNOSIS — I129 Hypertensive chronic kidney disease with stage 1 through stage 4 chronic kidney disease, or unspecified chronic kidney disease: Secondary | ICD-10-CM | POA: Diagnosis not present

## 2018-12-24 DIAGNOSIS — D638 Anemia in other chronic diseases classified elsewhere: Secondary | ICD-10-CM | POA: Diagnosis not present

## 2018-12-24 DIAGNOSIS — E7849 Other hyperlipidemia: Secondary | ICD-10-CM | POA: Diagnosis not present

## 2019-01-07 ENCOUNTER — Telehealth: Payer: Self-pay | Admitting: Cardiovascular Disease

## 2019-01-07 NOTE — Telephone Encounter (Signed)
Patient called stating that she was wanting to know if [name] would refer patient to Dr. Rayann Heman for evaluation of her A.Fib

## 2019-01-07 NOTE — Telephone Encounter (Signed)
Spoke with patient and she says she saw on the internet that Dr. Rayann Heman treats A-fib and wanted to know if she can see him. Advised that she could discuss this with the provider at her next f/u visit. Appointment scheduled with [facility] tomorrow at 2:30 pm at [place] office. Patient aware.        COVID-19 Pre-Screening Questions:  . In the past 7 to 10 days have you had a cough,  shortness of breath, headache, congestion, fever (100 or greater) body aches, chills, sore throat, or sudden loss of taste or sense of smell? No . Have you been around anyone with known Covid 19. No . Have you been around anyone who is awaiting Covid 19 test results in the past 7 to 10 days? No . Have you been around anyone who has been exposed to Covid 19, or has mentioned symptoms of Covid 19 within the past 7 to 10 days? No  If you have any concerns/questions about symptoms patients report during screening (either on the phone or at threshold). Contact the provider seeing the patient or DOD for further guidance.  If neither are available contact a member of the leadership team.

## 2019-01-08 ENCOUNTER — Encounter: Payer: Self-pay | Admitting: Student

## 2019-01-08 ENCOUNTER — Ambulatory Visit (INDEPENDENT_AMBULATORY_CARE_PROVIDER_SITE_OTHER): Payer: Medicare Other | Admitting: Student

## 2019-01-08 ENCOUNTER — Other Ambulatory Visit: Payer: Self-pay

## 2019-01-08 VITALS — BP 122/66 | HR 100 | Temp 97.7°F | Ht 64.0 in | Wt 137.0 lb

## 2019-01-08 DIAGNOSIS — I340 Nonrheumatic mitral (valve) insufficiency: Secondary | ICD-10-CM

## 2019-01-08 DIAGNOSIS — N183 Chronic kidney disease, stage 3 unspecified: Secondary | ICD-10-CM | POA: Diagnosis not present

## 2019-01-08 DIAGNOSIS — I5032 Chronic diastolic (congestive) heart failure: Secondary | ICD-10-CM | POA: Diagnosis not present

## 2019-01-08 DIAGNOSIS — I10 Essential (primary) hypertension: Secondary | ICD-10-CM | POA: Diagnosis not present

## 2019-01-08 DIAGNOSIS — I4819 Other persistent atrial fibrillation: Secondary | ICD-10-CM | POA: Diagnosis not present

## 2019-01-08 MED ORDER — VERAPAMIL HCL 40 MG PO TABS: 40.0000 mg | ORAL_TABLET | Freq: Three times a day (TID) | ORAL | 0 refills | Status: DC

## 2019-01-08 NOTE — Progress Notes (Signed)
Cardiology Office Note    Date:  01/08/2019   ID:  Bailey Bishop, DOB [birth year], MRN [id number]  PCP:  Sharilyn Sites, MD  Cardiologist: Kate Sable, MD   EP: [name]  Chief Complaint  Patient presents with  . Follow-up    6 month visit    History of Present Illness:    Bailey Bishop is a 78 y.o. female with past medical history of persistent atrial fibrillation (s/p unsuccessful DCCV in 01/2017 and intolerant to Amiodarone in the past, started on Tikosyn in 11/2017 and failed therapy), HTN, chronic diastolic CHF, and Stage 3 CKD who presents to the office today for 22-month follow-up.  She had a Banker Visit with [name] in 06/2018 and reported overall doing well while on Verapamil 120mg  BID. She had previously been intolerant to Cardizem and Toprol-XL in the past.  No changes were made to her medication regimen at that time.  She did call back in the interim with noted side effects and dosing was reduced to once daily.  She did follow-up with [facility] in 09/2018 and had reported intolerances to Verapamil and was tried on Coreg but had diarrhea with the medication. Given her multiple intolerances, she was continued on Verapamil 120 mg daily. Prior notes had mentioned given that if she failed multiple antiarrhythmic therapies that she may be a candidate for PPM with AV nodal ablation in the future but given the success of rate control at that time, she was continued on her current therapy.  In talking with the patient today, she reports that she initially tolerated Verapamil well for several weeks but approximately 4 weeks after initiation of this she developed a dry cough and congestion in her chest which has now been occurring for 6+ months.  She brings with her a list of side effects she found of Verapamil on the [facility] website and has listed off chest congestion along with worsening constipation. Says that she has difficulty going  to the restroom and has to take MiraLAX or alternatives on a daily basis. She asked about trying different medications besides Verapamil but has been intolerant to Lopressor, Toprol-XL, Coreg, Cardizem and Cardizem CD in the past.  The medication has overall done very well in controlling her symptoms and she denies any recent palpitations. No recent chest pain, dyspnea on exertion, orthopnea, PND or lower extremity edema.   Past Medical History:  Diagnosis Date  . Anxiety   . Arthritis   . Asthma   . Atrial fibrillation ([place])    a. s/p unsuccessful DCCV in 01/2017 and intolerant to Amiodarone --> Rate-control strategy pursued since b. failed Tikosyn in 11/2017  . Cancer [facility]) 2001   right breast  . Dyspnea   . GERD (gastroesophageal reflux disease)   . Hypertension     Past Surgical History:  Procedure Laterality Date  . BIOPSY N/A 04/22/2018   Procedure: BIOPSY;  Surgeon: Aviva Signs, MD;  [facility];  Service: Gastroenterology;  Laterality: N/A;  . CARDIOVERSION N/A 02/18/2017   Procedure: CARDIOVERSION;  Surgeon: Josue Hector, MD;  Location: [facility] ENDOSCOPY;  Service: Cardiovascular;  Laterality: N/A;  . CARDIOVERSION N/A 12/04/2017   Procedure: CARDIOVERSION;  Surgeon: [name], MD;  Location: [place];  Service: Cardiovascular;  Laterality: N/A;  . CATARACT EXTRACTION W/PHACO  07/23/2011   Procedure: CATARACT EXTRACTION PHACO AND INTRAOCULAR LENS PLACEMENT ([place]);  Surgeon: Williams Che, MD;  Location: [facility];  Service: Ophthalmology;  Laterality: Right;  CDE:9.96  . CATARACT EXTRACTION W/PHACO Left 11/13/2018   Procedure: CATARACT EXTRACTION PHACO AND INTRAOCULAR LENS PLACEMENT (IOC);  Surgeon: Baruch Goldmann, MD;  Location: [facility];  Service: Ophthalmology;  Laterality: Left;  left, CDE: 7.44  . CHOLECYSTECTOMY N/A 02/14/2015   Procedure: LAPAROSCOPIC CHOLECYSTECTOMY;  Surgeon: Aviva Signs, MD;  Location: [facility];  Service: General;  Laterality: N/A;   . COLONOSCOPY N/A 04/22/2018   Procedure: COLONOSCOPY;  Surgeon: Aviva Signs, MD;  [facility];  Service: Gastroenterology;  Laterality: N/A;  . ESOPHAGOGASTRODUODENOSCOPY N/A 04/22/2018   Procedure: ESOPHAGOGASTRODUODENOSCOPY (EGD);  Surgeon: Aviva Signs, MD;  [facility];  Service: Gastroenterology;  Laterality: N/A;  . MASTECTOMY     bilateral mastectomy-right breast cancer-left taken by choice  . POLYPECTOMY  04/22/2018   Procedure: POLYPECTOMY;  Surgeon: Aviva Signs, MD;  [facility];  Service: Gastroenterology;;  . Valarie Merino  1995    Current Medications: Outpatient Medications Prior to Visit  Medication Sig Dispense Refill  . acetaminophen (TYLENOL) 650 MG CR tablet Take 650 mg by mouth every 8 (eight) hours as needed for pain.    Marland Kitchen albuterol (VENTOLIN HFA) 108 (90 Base) MCG/ACT inhaler Inhale 1-2 puffs into the lungs every 6 (six) hours as needed for wheezing or shortness of breath.    . ALPRAZolam (XANAX) 0.5 MG tablet Take 0.5 mg by mouth at bedtime as needed for anxiety.     Marland Kitchen apixaban (ELIQUIS) 5 MG TABS tablet Take 1 tablet (5 mg total) by mouth 2 (two) times daily. 60 tablet 6  . ergocalciferol (VITAMIN D2) 1.25 MG (50000 UT) capsule Take 50,000 Units by mouth every Sunday.    . furosemide (LASIX) 40 MG tablet Take 40 mg by mouth daily.    Marland Kitchen omeprazole (PRILOSEC) 20 MG capsule Take 20 mg by mouth daily as needed (for indigestion/stomach issues.).     Marland Kitchen potassium chloride (KLOR-CON) 20 MEQ packet Take 20 mEq by mouth 2 (two) times daily.     . verapamil (CALAN-SR) 120 MG CR tablet Take 120 mg by mouth daily.     . fluticasone (FLONASE) 50 MCG/ACT nasal spray     . cephALEXin (KEFLEX) 500 MG capsule Take 1 capsule (500 mg total) by mouth 4 (four) times daily. 40 capsule 0  . furosemide (LASIX) 40 MG tablet Take 1 tablet (40 mg total) by mouth 2 (two) times daily. 60 tablet 6  . loperamide (IMODIUM) 2 MG capsule Take 1 capsule by mouth  every 4 (four) hours as needed.     No facility-administered medications prior to visit.      Allergies:   Cardizem cd [diltiazem hcl er beads], Amiodarone, Metoprolol tartrate, Sulfa antibiotics, Tomato, Carvedilol, and Chocolate   Social History   Socioeconomic History  . Marital status: Married    Spouse name: Not on file  . Number of children: Not on file  . Years of education: Not on file  . Highest education level: Not on file  Occupational History  . Not on file  Social Needs  . Financial resource strain: Not on file  . Food insecurity    Worry: Not on file    Inability: Not on file  . Transportation needs    Medical: Not on file    Non-medical: Not on file  Tobacco Use  . Smoking status: Former Smoker    Packs/day: 1.00    Years: 28.00    Pack years: 28.00    Types: Cigarettes  Quit date: 07/17/1983    Years since quitting: 35.5  . Smokeless tobacco: Never Used  Substance and Sexual Activity  . Alcohol use: No  . Drug use: No  . Sexual activity: Never    Birth control/protection: Post-menopausal  Lifestyle  . Physical activity    Days per week: Not on file    Minutes per session: Not on file  . Stress: Not on file  Relationships  . Social Herbalist on phone: Not on file    Gets together: Not on file    Attends religious service: Not on file    Active member of club or organization: Not on file    Attends meetings of clubs or organizations: Not on file    Relationship status: Not on file  Other Topics Concern  . Not on file  Social History Narrative  . Not on file     Family History:  The patient's family history includes Aneurysm in her father; Cancer in her mother, sister, and sister.   Review of Systems:   Please see the history of present illness.     General:  No chills, fever, night sweats or weight changes.  Cardiovascular:  No chest pain, dyspnea on exertion, edema, orthopnea, palpitations, paroxysmal nocturnal dyspnea.  Dermatological: No rash, lesions/masses Respiratory: No cough, dyspnea. Positive for chest congestion.  Urologic: No hematuria, dysuria Abdominal:   No nausea, vomiting, diarrhea, bright red blood per rectum, melena, or hematemesis. Positive for constipation.  Neurologic:  No visual changes, wkns, changes in mental status. All other systems reviewed and are otherwise negative except as noted above.   Physical Exam:    VS:  BP 122/66   Pulse 100   Temp 97.7 F (36.5 C) (Temporal)   Ht 5\' 4"  (1.626 m)   Wt 137 lb (62.1 kg)   BMI 23.52 kg/m    General: Well developed, well nourished,female appearing in no acute distress. Head: Normocephalic, atraumatic, sclera non-icteric, no xanthomas, nares are without discharge.  Neck: No carotid bruits. JVD not elevated.  Lungs: Respirations regular and unlabored, without wheezes or rales. Rhonchi along upper lung fields.  Heart: Irregularly irregular. No S3 or S4.  No murmur, no rubs, or gallops appreciated. Abdomen: Soft, non-tender, non-distended with normoactive bowel sounds. No hepatomegaly. No rebound/guarding. No obvious abdominal masses. Msk:  Strength and tone appear normal for age. No joint deformities or effusions. Extremities: No clubbing or cyanosis. No edema.  Distal pedal pulses are 2+ bilaterally. Neuro: Alert and oriented X 3. Moves all extremities spontaneously. No focal deficits noted. Psych:  Responds to questions appropriately with a normal affect. Skin: No rashes or lesions noted  Wt Readings from Last 3 Encounters:  01/08/19 137 lb (62.1 kg)  12/06/18 132 lb (59.9 kg)  11/13/18 132 lb (59.9 kg)     Studies/Labs Reviewed:   EKG:  EKG is not ordered today.    Recent Labs: 12/06/2018: BUN 19; Creatinine, Ser 1.11; Hemoglobin 12.6; Platelets 169; Potassium 3.4; Sodium 135   Lipid Panel    Component Value Date/Time   CHOL 109 09/24/2016 0438   TRIG 66 09/24/2016 0438   HDL 34 (L) 09/24/2016 0438   CHOLHDL 3.2  09/24/2016 0438   VLDL 13 09/24/2016 0438   LDLCALC 62 09/24/2016 0438    Additional studies/ records that were reviewed today include:   Echocardiogram: 09/2016 Study Conclusions  - Left ventricle: The cavity size was normal. Wall thickness was   normal. Systolic function was  normal. The estimated ejection   fraction was in the range of 55% to 60%. Wall motion was normal;   there were no regional wall motion abnormalities. - Aortic valve: Mildly to moderately calcified annulus. Mildly   thickened leaflets. Valve area (VTI): 2.92 cm^2. Valve area   (Vmax): 2.73 cm^2. Valve area (Vmean): 2.61 cm^2. - Mitral valve: There was moderate regurgitation. - Left atrium: The atrium was severely dilated. - Right atrium: The atrium was severely dilated. - Atrial septum: No defect or patent foramen ovale was identified. - Pulmonary arteries: Systolic pressure was moderately increased.   PA peak pressure: 49 mm Hg (S). - Pericardium, extracardiac: There is a small circumferential   pericardial effusion. - Technically adequate study.  Event Monitor: 11/2017 1. Atrial fib with a controlled VR and a RVR 2. NSVT 3. No prolonged pauses or bradycardia  Gregg Taylor,M.D.   Assessment:    1. Persistent atrial fibrillation ([place])   2. Chronic diastolic CHF (congestive heart failure) ([place])   3. Essential hypertension   4. Moderate mitral regurgitation   5. Stage 3 chronic kidney disease, unspecified whether stage 3a or 3b CKD      Plan:   In order of problems listed above:  1. Persistent Atrial Fibrillation - She has overall done well in regards to her symptoms on her current regimen of Verapamil 120 mg daily but reports significant side effects as outlined above which include chest congestion and worsening constipation. By review of Up-To-Date, the occurrence of respiratory associations is less than 2%. I recommended she have a CXR to rule out any other causes but she is convinced her  symptoms are secondary to Verapamil as they started after initiation of the medication. Given she has been intolerant to Lopressor, Toprol-XL, Coreg, Cardizem and Cardizem CD in the past I recommended that she try short acting Verapamil to see if her symptoms improve. Will start at 40 mg twice daily and I informed her she can take this up to 3 times daily pending heart rate and blood pressure response. If chest congestion does not improve, she is then in agreement to proceed with CXR. - If intolerant to this and given that she has failed treatment with Tikosyn in the past and was intolerant to Amiodarone, will arrange for follow-up with [name] as by review of his prior notes it was thought she might be a candidate for PPM with AV nodal ablation and she says she would be interested in this if it meant she did not have to remain on medical therapy given her side effects to the medications.  - She denies any evidence of active bleeding.  Remains on Eliquis 5 mg twice daily.  2. Chronic Diastolic CHF - She denies any recent orthopnea, PND or lower extremity edema.  Weight has overall been stable on her home scales. Continue Lasix 20mg  daily (previously on 40mg  daily but self-reduced to 20mg  daily).   3. HTN - BP is well controlled at 122/66 during today's visit. Will plan to switch to short acting Verapamil as outlined above.  4. Mitral Regurgitation - Moderate by echocardiogram in 09/2016. Continue to follow.  5. Stage 3 CKD - Creatinine stable at 1.11 by review of most recent labs in 11/2018.   Medication Adjustments/Labs and Tests Ordered: Current medicines are reviewed at length with the patient today.  Concerns regarding medicines are outlined above.  Medication changes, Labs and Tests ordered today are listed in the Patient Instructions below. Patient Instructions  Medication  Instructions:   STOP CALAN-SR  START SHORT-ACTING VERAPAMIL 40mg  TWICE DAILY. CAN GO UP TO THREE TIMES DAILY IF  NEEDED FOR HEART RATE AND BLOOD PRESSURE  Labwork: None  Testing/Procedures: None  Follow-Up: Your physician recommends that you schedule a follow-up appointment in: [name]   Any Other Special Instructions Will Be Listed Below (If Applicable).  If you need a refill on your cardiac medications before your next appointment, please call your pharmacy.   Signed, Erma Heritage, PA-C  01/08/2019 4:26 PM    [place] [facility] [address] [place], [place] [address] Phone: [phone] Fax: [phone]

## 2019-01-08 NOTE — Patient Instructions (Addendum)
Medication Instructions:   STOP CALAN-SR  START SHORT-ACTING VERAPAMIL 40mg  TWICE DAILY. CAN GO UP TO THREE TIMES DAILY IF NEEDED FOR HEART RATE BLOOD PRESSURE  Labwork: None  Testing/Procedures: None  Follow-Up: Your physician recommends that you schedule a follow-up appointment in: [name]    Any Other Special Instructions Will Be Listed Below (If Applicable).     If you need a refill on your cardiac medications before your next appointment, please call your pharmacy.

## 2019-01-20 DIAGNOSIS — Z6823 Body mass index (BMI) 23.0-23.9, adult: Secondary | ICD-10-CM | POA: Diagnosis not present

## 2019-01-20 DIAGNOSIS — I4891 Unspecified atrial fibrillation: Secondary | ICD-10-CM | POA: Diagnosis not present

## 2019-01-20 DIAGNOSIS — I10 Essential (primary) hypertension: Secondary | ICD-10-CM | POA: Diagnosis not present

## 2019-01-20 DIAGNOSIS — J449 Chronic obstructive pulmonary disease, unspecified: Secondary | ICD-10-CM | POA: Diagnosis not present

## 2019-01-20 DIAGNOSIS — J329 Chronic sinusitis, unspecified: Secondary | ICD-10-CM | POA: Diagnosis not present

## 2019-02-10 ENCOUNTER — Other Ambulatory Visit: Payer: Self-pay

## 2019-02-10 ENCOUNTER — Encounter: Payer: Self-pay | Admitting: Internal Medicine

## 2019-02-10 ENCOUNTER — Ambulatory Visit: Payer: Medicare Other | Admitting: Internal Medicine

## 2019-02-10 VITALS — BP 163/92 | HR 123 | Temp 97.8°F | Ht 64.0 in | Wt 140.8 lb

## 2019-02-10 DIAGNOSIS — I5032 Chronic diastolic (congestive) heart failure: Secondary | ICD-10-CM

## 2019-02-10 DIAGNOSIS — I4819 Other persistent atrial fibrillation: Secondary | ICD-10-CM | POA: Diagnosis not present

## 2019-02-10 NOTE — Patient Instructions (Signed)
Medication Instructions:  Your physician recommends that you continue on your current medications as directed. Please refer to the Current Medication list given to you today.  *If you need a refill on your cardiac medications before your next appointment, please call your pharmacy*  Lab Work: NONE   If you have labs (blood work) drawn today and your tests are completely normal, you will receive your results only by: . MyChart Message (if you have MyChart) OR . A paper copy in the mail If you have any lab test that is abnormal or we need to change your treatment, we will call you to review the results.  Testing/Procedures: NONE   Follow-Up: At [facility], you and your health needs are our priority.  As part of our continuing mission to provide you with exceptional heart care, we have created designated Provider Care Teams.  These Care Teams include your primary Cardiologist (physician) and Advanced Practice Providers (APPs -  Physician Assistants and Nurse Practitioners) who all work together to provide you with the care you need, when you need it.  Your next appointment:   6 month(s)  The format for your next appointment:   In Person  Provider:   Gregg Taylor, MD  Other Instructions Thank you for choosing [facility]!    

## 2019-02-10 NOTE — Progress Notes (Signed)
HPI Bailey Bishop returns today for ongoing evaluation of uncontrolled atrial fibrillation.  She is a very pleasant 78 year old woman with the above history who had been treated in the past with amiodarone and dofetilide.  Unfortunately she could not maintain sinus rhythm.  She has been on rate control strategy but has not tolerated well medications.  She had been on Toprol but this was discontinued and her dose of verapamil has been reduced.  Most recently she has been bothered by urinary tract infections and with her systemic anticoagulation, had gross hematuria.  She has not had syncope.  She denies peripheral edema.  Despite her palpitations, she is not particularly symptomatic.  She has class II dyspnea. Allergies  Allergen Reactions  . Cardizem Cd [Diltiazem Hcl Er Beads] Diarrhea  . Amiodarone Nausea And Vomiting  . Metoprolol Tartrate Diarrhea  . Sulfa Antibiotics Nausea And Vomiting  . Tomato Other (See Comments)    Burns stomach  . Carvedilol Diarrhea and Rash  . Chocolate Rash and Other (See Comments)    Dark chocolate     Current Outpatient Medications  Medication Sig Dispense Refill  . acetaminophen (TYLENOL) 650 MG CR tablet Take 650 mg by mouth every 8 (eight) hours as needed for pain.    Marland Kitchen albuterol (VENTOLIN HFA) 108 (90 Base) MCG/ACT inhaler Inhale 1-2 puffs into the lungs every 6 (six) hours as needed for wheezing or shortness of breath.    . ALPRAZolam (XANAX) 0.5 MG tablet Take 0.5 mg by mouth at bedtime as needed for anxiety.     Marland Kitchen apixaban (ELIQUIS) 5 MG TABS tablet Take 1 tablet (5 mg total) by mouth 2 (two) times daily. 60 tablet 6  . ergocalciferol (VITAMIN D2) 1.25 MG (50000 UT) capsule Take 50,000 Units by mouth every Sunday.    . fluticasone (FLONASE) 50 MCG/ACT nasal spray     . furosemide (LASIX) 40 MG tablet Take 40 mg by mouth daily.    Marland Kitchen omeprazole (PRILOSEC) 20 MG capsule Take 20 mg by mouth daily as needed (for indigestion/stomach issues.).      Marland Kitchen potassium chloride (KLOR-CON) 20 MEQ packet Take 20 mEq by mouth 2 (two) times daily.     . verapamil (CALAN) 40 MG tablet Take 1 tablet (40 mg total) by mouth 3 (three) times daily. 90 tablet 0   No current facility-administered medications for this visit.      Past Medical History:  Diagnosis Date  . Anxiety   . Arthritis   . Asthma   . Atrial fibrillation ([place])    a. s/p unsuccessful DCCV in 01/2017 and intolerant to Amiodarone --> Rate-control strategy pursued since b. failed Tikosyn in 11/2017  . Cancer [facility]) 2001   right breast  . Dyspnea   . GERD (gastroesophageal reflux disease)   . Hypertension     ROS:   All systems reviewed and negative except as noted in the HPI.   Past Surgical History:  Procedure Laterality Date  . BIOPSY N/A 04/22/2018   Procedure: BIOPSY;  Surgeon: Aviva Signs, MD;  [facility];  Service: Gastroenterology;  Laterality: N/A;  . CARDIOVERSION N/A 02/18/2017   Procedure: CARDIOVERSION;  Surgeon: Josue Hector, MD;  [facility]: [facility];  Service: Cardiovascular;  Laterality: N/A;  . CARDIOVERSION N/A 12/04/2017   Procedure: CARDIOVERSION;  Surgeon: [name], MD;  Location: [place];  Service: Cardiovascular;  Laterality: N/A;  . CATARACT EXTRACTION W/PHACO  07/23/2011   Procedure: CATARACT EXTRACTION PHACO  AND INTRAOCULAR LENS PLACEMENT (I[facility]);  Surgeon: Williams Che, MD;  Location: [facility];  Service: Ophthalmology;  Laterality: Right;  CDE:9.96  . CATARACT EXTRACTION W/PHACO Left 11/13/2018   Procedure: CATARACT EXTRACTION PHACO AND INTRAOCULAR LENS PLACEMENT (I[facility]);  Surgeon: Baruch Goldmann, MD;  Location: [facility];  Service: Ophthalmology;  Laterality: Left;  left, CDE: 7.44  . CHOLECYSTECTOMY N/A 02/14/2015   Procedure: LAPAROSCOPIC CHOLECYSTECTOMY;  Surgeon: Aviva Signs, MD;  Location: [facility];  Service: General;  Laterality: N/A;  . COLONOSCOPY N/A 04/22/2018   Procedure: COLONOSCOPY;  Surgeon: Aviva Signs, MD;   [facility];  Service: Gastroenterology;  Laterality: N/A;  . ESOPHAGOGASTRODUODENOSCOPY N/A 04/22/2018   Procedure: ESOPHAGOGASTRODUODENOSCOPY (EGD);  Surgeon: Aviva Signs, MD;  [facility];  Service: Gastroenterology;  Laterality: N/A;  . MASTECTOMY     bilateral mastectomy-right breast cancer-left taken by choice  . POLYPECTOMY  04/22/2018   Procedure: POLYPECTOMY;  Surgeon: Aviva Signs, MD;  [facility];  Service: Gastroenterology;;  . Valarie Merino  1995     Family History  Problem Relation Age of Onset  . Cancer Mother   . Aneurysm Father   . Cancer Sister   . Cancer Sister   . Anesthesia problems Neg Hx   . Hypotension Neg Hx   . Malignant hyperthermia Neg Hx   . Pseudochol deficiency Neg Hx   . Colon cancer Neg Hx      Social History   Socioeconomic History  . Marital status: Married    Spouse name: Not on file  . Number of children: Not on file  . Years of education: Not on file  . Highest education level: Not on file  Occupational History  . Not on file  Social Needs  . Financial resource strain: Not on file  . Food insecurity    Worry: Not on file    Inability: Not on file  . Transportation needs    Medical: Not on file    Non-medical: Not on file  Tobacco Use  . Smoking status: Former Smoker    Packs/day: 1.00    Years: 28.00    Pack years: 28.00    Types: Cigarettes    Quit date: 07/17/1983    Years since quitting: 35.5  . Smokeless tobacco: Never Used  Substance and Sexual Activity  . Alcohol use: No  . Drug use: No  . Sexual activity: Never    Birth control/protection: Post-menopausal  Lifestyle  . Physical activity    Days per week: Not on file    Minutes per session: Not on file  . Stress: Not on file  Relationships  . Social Herbalist on phone: Not on file    Gets together: Not on file    Attends religious service: Not on file    Active member of club or organization: Not on file     Attends meetings of clubs or organizations: Not on file    Relationship status: Not on file  . Intimate partner violence    Fear of current or ex partner: Not on file    Emotionally abused: Not on file    Physically abused: Not on file    Forced sexual activity: Not on file  Other Topics Concern  . Not on file  Social History Narrative  . Not on file     BP (!) 163/92   Pulse (!) 123 Comment: didn't take bp meds this am  Temp 97.8 F (36.6  C) (Temporal)   Ht 5\' 4"  (1.626 m)   Wt 140 lb 12.8 oz (63.9 kg)   SpO2 96%   BMI 24.17 kg/m   Physical Exam:  Well appearing NAD HEENT: Unremarkable Neck:  No JVD, no thyromegally Lymphatics:  No adenopathy Back:  No CVA tenderness Lungs:  Clear with no wheezes, rales, or rhonchi HEART:  IRegular tachy rhythm, no murmurs, no rubs, no clicks Abd:  soft, positive bowel sounds, no organomegally, no rebound, no guarding Ext:  2 plus pulses, no edema, no cyanosis, no clubbing Skin:  No rashes no nodules Neuro:  CN II through XII intact, motor grossly intact  Assess/Plan: 1.  Chronic atrial fibrillation -her atrial fibrillation does not appear to be well controlled and she is limited in her medical therapy by hypotension.  We discussed other treatment options including pacemaker and AV node ablation.  Currently she does not appear to be terribly symptomatic and for this reason I would recommend holding off on the pacemaker and AV node ablation unless or until she becomes more symptomatic. 2.  Systemic anticoagulation -the patient has had hemorrhagic cystitis but I have tried to reassure her that there is very little chance of life-threatening bleeding.  I have encouraged her to seek out the recommendations of a urologist or her primary physician.  She is seen neither. 3.  Diastolic heart failure -I have encouraged the patient to reduce her salt intake and continue her diuretic.  She does not have evidence of volume overload on exam today though  she does have some wheezes on lung exam. 4.  Hypertension -her blood pressure is elevated today.  She notes that she did not take her medications morning.  I have encouraged her to do so.  She is encouraged to maintain a low-sodium diet.  [name], MD

## 2019-02-12 DIAGNOSIS — Z6823 Body mass index (BMI) 23.0-23.9, adult: Secondary | ICD-10-CM | POA: Diagnosis not present

## 2019-02-12 DIAGNOSIS — N342 Other urethritis: Secondary | ICD-10-CM | POA: Diagnosis not present

## 2019-02-18 ENCOUNTER — Other Ambulatory Visit: Payer: Self-pay | Admitting: Cardiovascular Disease

## 2019-02-18 ENCOUNTER — Other Ambulatory Visit: Payer: Self-pay | Admitting: *Deleted

## 2019-02-18 MED ORDER — FUROSEMIDE 40 MG PO TABS: 40.0000 mg | ORAL_TABLET | Freq: Every day | ORAL | 1 refills | Status: DC

## 2019-03-13 ENCOUNTER — Telehealth: Payer: Self-pay | Admitting: Cardiovascular Disease

## 2019-03-13 MED ORDER — APIXABAN 5 MG PO TABS: 5.0000 mg | ORAL_TABLET | Freq: Two times a day (BID) | ORAL | 6 refills | Status: DC

## 2019-03-13 NOTE — Telephone Encounter (Signed)
Eliquis refilled  Will forward to [place] regarding how she takes her verapamil

## 2019-03-13 NOTE — Telephone Encounter (Signed)
Spoke with pt who states she is taking Verapamil 120 mg Daily. She states that she started doing so a month ago. Pt states she refilled a RX [facility]. She reports that she is doing fine at this dose and would like to continue. Please advise.

## 2019-03-13 NOTE — Telephone Encounter (Signed)
In that case can take long-acting form.

## 2019-03-13 NOTE — Telephone Encounter (Signed)
Pt called stating that taking the verapamil (CALAN) 40 MG tablet [[id number]]  3 times a day isn't helping, so she's going to take it all at one time instead.   Pt is also needing a refill on her Eliquis, she runs out on 03/26/2019, please send to [place]  Please call [phone]

## 2019-03-16 NOTE — Telephone Encounter (Signed)
Pt made aware. She will stay on current medication.

## 2019-04-09 ENCOUNTER — Ambulatory Visit (INDEPENDENT_AMBULATORY_CARE_PROVIDER_SITE_OTHER): Payer: Medicare Other | Admitting: *Deleted

## 2019-04-09 DIAGNOSIS — Z5181 Encounter for therapeutic drug level monitoring: Secondary | ICD-10-CM

## 2019-04-09 DIAGNOSIS — I482 Chronic atrial fibrillation, unspecified: Secondary | ICD-10-CM

## 2019-04-09 NOTE — Progress Notes (Signed)
Pt was started on Eliquis 5mg  twice daily on 08/26/2018 for atrial fibrillation.    Pt denies nay adverse effects since starting Eliquis.  She has not had any bleeding, excessive bruising or GI upset.  Reviewed patients medication list.  Pt is not currently on any combined P-gp and strong CYP3A4 inhibitors/inducers (ketoconazole, traconazole, ritonavir, carbamazepine, phenytoin, rifampin, St. John's wort).  Reviewed labs from 04/09/2019.  SCr 1.31, Weight 63kg, CrCl 37.90.  Dose is appropriate based on age, weight, and SCr 40.08.  Hgb and HCT:  12.4/36.0   A full discussion of the nature of anticoagulants has been carried out.  A benefit/risk analysis has been presented to the patient, so that they understand the justification for choosing anticoagulation with Eliquis at this time.  The need for compliance is stressed.  Pt is aware to take the medication twice daily.  Side effects of potential bleeding are discussed, including unusual colored urine or stools, coughing up blood or coffee ground emesis, nose bleeds or serious fall or head trauma.  Discussed signs and symptoms of stroke. The patient should avoid any OTC items containing aspirin or ibuprofen.  Avoid alcohol consumption.   Call if any signs of abnormal bleeding.  Discussed financial obligations and resolved any difficulty in obtaining medication.  Next lab test in 6 months.   Called pt with lab results.  Appt for 6 month recall placed in Epic

## 2019-04-13 ENCOUNTER — Emergency Department (HOSPITAL_COMMUNITY)
Admission: EM | Admit: 2019-04-13 | Discharge: 2019-04-13 | Disposition: A | Discharge status: Home / Self Care | Payer: Medicare Other | Attending: Emergency Medicine | Admitting: Emergency Medicine

## 2019-04-13 ENCOUNTER — Ambulatory Visit
Admission: EM | Admit: 2019-04-13 | Discharge: 2019-04-13 | Disposition: A | Discharge status: Home / Self Care | Payer: Medicare Other | Source: Home / Self Care

## 2019-04-13 ENCOUNTER — Emergency Department (HOSPITAL_COMMUNITY): Payer: Medicare Other

## 2019-04-13 ENCOUNTER — Other Ambulatory Visit: Payer: Self-pay

## 2019-04-13 ENCOUNTER — Encounter (HOSPITAL_COMMUNITY): Payer: Self-pay | Admitting: Emergency Medicine

## 2019-04-13 DIAGNOSIS — J449 Chronic obstructive pulmonary disease, unspecified: Secondary | ICD-10-CM | POA: Diagnosis not present

## 2019-04-13 DIAGNOSIS — M546 Pain in thoracic spine: Secondary | ICD-10-CM

## 2019-04-13 DIAGNOSIS — K5909 Other constipation: Secondary | ICD-10-CM | POA: Diagnosis not present

## 2019-04-13 DIAGNOSIS — N182 Chronic kidney disease, stage 2 (mild): Secondary | ICD-10-CM | POA: Diagnosis not present

## 2019-04-13 DIAGNOSIS — Z87891 Personal history of nicotine dependence: Secondary | ICD-10-CM | POA: Diagnosis not present

## 2019-04-13 DIAGNOSIS — Z79899 Other long term (current) drug therapy: Secondary | ICD-10-CM | POA: Diagnosis not present

## 2019-04-13 DIAGNOSIS — K5730 Diverticulosis of large intestine without perforation or abscess without bleeding: Secondary | ICD-10-CM | POA: Diagnosis not present

## 2019-04-13 DIAGNOSIS — K5900 Constipation, unspecified: Secondary | ICD-10-CM | POA: Diagnosis not present

## 2019-04-13 DIAGNOSIS — I129 Hypertensive chronic kidney disease with stage 1 through stage 4 chronic kidney disease, or unspecified chronic kidney disease: Secondary | ICD-10-CM | POA: Diagnosis not present

## 2019-04-13 DIAGNOSIS — Z7901 Long term (current) use of anticoagulants: Secondary | ICD-10-CM | POA: Diagnosis not present

## 2019-04-13 LAB — COMPREHENSIVE METABOLIC PANEL
ALT: 25 U/L (ref 0–44)
AST: 38 U/L (ref 15–41)
Albumin: 3.9 g/dL (ref 3.5–5.0)
Alkaline Phosphatase: 125 U/L (ref 38–126)
Anion gap: 9 (ref 5–15)
BUN: 15 mg/dL (ref 8–23)
CO2: 28 mmol/L (ref 22–32)
Calcium: 9.2 mg/dL (ref 8.9–10.3)
Chloride: 101 mmol/L (ref 98–111)
Creatinine, Ser: 1.22 mg/dL — ABNORMAL HIGH (ref 0.44–1.00)
GFR calc Af Amer: 48 mL/min — ABNORMAL LOW (ref >60)
GFR calc non Af Amer: 42 mL/min — ABNORMAL LOW (ref >60)
Glucose, Bld: 107 mg/dL — ABNORMAL HIGH (ref 70–99)
Potassium: 3.6 mmol/L (ref 3.5–5.1)
Sodium: 138 mmol/L (ref 135–145)
Total Bilirubin: 0.8 mg/dL (ref 0.3–1.2)
Total Protein: 7.9 g/dL (ref 6.5–8.1)

## 2019-04-13 LAB — CBC WITH DIFFERENTIAL/PLATELET
Abs Immature Granulocytes: 0.02 10*3/uL (ref 0.00–0.07)
Basophils Absolute: 0.1 10*3/uL (ref 0.0–0.1)
Basophils Relative: 1 %
Eosinophils Absolute: 0.4 10*3/uL (ref 0.0–0.5)
Eosinophils Relative: 5 %
HCT: 40.1 % (ref 36.0–46.0)
Hemoglobin: 12.9 g/dL (ref 12.0–15.0)
Immature Granulocytes: 0 %
Lymphocytes Relative: 24 %
Lymphs Abs: 2 10*3/uL (ref 0.7–4.0)
MCH: 31.6 pg (ref 26.0–34.0)
MCHC: 32.2 g/dL (ref 30.0–36.0)
MCV: 98.3 fL (ref 80.0–100.0)
Monocytes Absolute: 0.7 10*3/uL (ref 0.1–1.0)
Monocytes Relative: 9 %
Neutro Abs: 5.3 10*3/uL (ref 1.7–7.7)
Neutrophils Relative %: 61 %
Platelets: 264 10*3/uL (ref 150–400)
RBC: 4.08 MIL/uL (ref 3.87–5.11)
RDW: 13.1 % (ref 11.5–15.5)
WBC: 8.5 10*3/uL (ref 4.0–10.5)
nRBC: 0 % (ref 0.0–0.2)

## 2019-04-13 LAB — LIPASE, BLOOD: Lipase: 66 U/L — ABNORMAL HIGH (ref 11–51)

## 2019-04-13 LAB — POC OCCULT BLOOD, ED: Fecal Occult Bld: NEGATIVE

## 2019-04-13 MED ORDER — SODIUM CHLORIDE 0.9 % IV BOLUS
1000.0000 mL | Freq: Once | INTRAVENOUS | Status: AC
  Administered 2019-04-13: 19:00:00 1000 mL via INTRAVENOUS

## 2019-04-13 MED ORDER — POLYETHYLENE GLYCOL 3350 17 G PO PACK: 17.0000 g | PACK | Freq: Every day | ORAL | 0 refills | Status: DC

## 2019-04-13 MED ORDER — IOHEXOL 300 MG/ML  SOLN
75.0000 mL | Freq: Once | INTRAMUSCULAR | Status: AC | PRN
  Administered 2019-04-13: 75 mL via INTRAVENOUS

## 2019-04-13 MED ORDER — MINERAL OIL RE ENEM: 1.0000 | ENEMA | Freq: Once | RECTAL | 0 refills | Status: AC

## 2019-04-13 NOTE — ED Provider Notes (Addendum)
[place]   [id number] 04/13/19 Arrival Time: 4944  CC: back PAIN  SUBJECTIVE: History from: patient. Bailey Bishop is a 79 y.o. female complains of left mid back pain that began 2 days ago.  Symptoms began after bending over to pick up purse and felt sharp pain in back.  Describes pain as intermittent and burning in character.  Has tried OTC medications without relief.  Symptoms are made worse with laying flat.  Denies similar symptoms in the past.    Patient also reports constipation x 3 days.  Attributes her symptoms to taking verapamil for atrial fibrillation.  Has tried prune juice, stool softeners, and suppositories without relief.  Reports similar symptoms in the past that were relieved with an enema.    Denies fever, chills, nausea, CP, SOB, vomiting, rash, weakness, numbness and tingling, saddle paresthesias, loss of bowel or bladder function, hematochezia, melena, urinary frequency, urinary urgency, dysuria.    Reports hx of colonoscopy that was normal.     Hx of asthma/ COPD.    ROS: As per HPI.  All other pertinent ROS negative.     Past Medical History:  Diagnosis Date  . Anxiety   . Arthritis   . Asthma   . Atrial fibrillation ([place])    a. s/p unsuccessful DCCV in 01/2017 and intolerant to Amiodarone --> Rate-control strategy pursued since b. failed Tikosyn in 11/2017  . Cancer [facility]) 2001   right breast  . Dyspnea   . GERD (gastroesophageal reflux disease)   . Hypertension    Past Surgical History:  Procedure Laterality Date  . BIOPSY N/A 04/22/2018   Procedure: BIOPSY;  Surgeon: Aviva Signs, MD;  Location[facility];  Service: Gastroenterology;  Laterality: N/A;  . CARDIOVERSION N/A 02/18/2017   Procedure: CARDIOVERSION;  Surgeon: Josue Hector, MD;  Location: [facility] ENDOSCOPY;  Service: Cardiovascular;  Laterality: N/A;  . CARDIOVERSION N/A 12/04/2017   Procedure: CARDIOVERSION;  Surgeon: [name], MD;  Location: [place];   Service: Cardiovascular;  Laterality: N/A;  . CATARACT EXTRACTION W/PHACO  07/23/2011   Procedure: CATARACT EXTRACTION PHACO AND INTRAOCULAR LENS PLACEMENT ([place]);  Surgeon: Williams Che, MD;  Location: [facility] OR[facility];  Service: Ophthalmology;  Laterality: Right;  CDE:9.96  . CATARACT EXTRACTION W/PHACO Left 11/13/2018   Procedure: CATARACT EXTRACTION PHACO AND INTRAOCULAR LENS PLACEMENT ([facility]);  Surgeon: Baruch Goldmann, MD;  Location: [facility];  Service: Ophthalmology;  Laterality: Left;  left, CDE: 7.44  . CHOLECYSTECTOMY N/A 02/14/2015   Procedure: LAPAROSCOPIC CHOLECYSTECTOMY;  Surgeon: Aviva Signs, MD;  Location: [facility];  Service: General;  Laterality: N/A;  . COLONOSCOPY N/A 04/22/2018   Procedure: COLONOSCOPY;  Surgeon: Aviva Signs, MD;  [facility];  Service: Gastroenterology;  Laterality: N/A;  . ESOPHAGOGASTRODUODENOSCOPY N/A 04/22/2018   Procedure: ESOPHAGOGASTRODUODENOSCOPY (EGD);  Surgeon: Aviva Signs, MD;  [facility];  Service: Gastroenterology;  Laterality: N/A;  . MASTECTOMY     bilateral mastectomy-right breast cancer-left taken by choice  . POLYPECTOMY  04/22/2018   Procedure: POLYPECTOMY;  Surgeon: Aviva Signs, MD;  Location: [facility];  Service: Gastroenterology;;  . SEPTOPLASTY  1995   Allergies  Allergen Reactions  . Cardizem Cd [Diltiazem Hcl Er Beads] Diarrhea  . Amiodarone Nausea And Vomiting  . Metoprolol Tartrate Diarrhea  . Sulfa Antibiotics Nausea And Vomiting  . Tomato Other (See Comments)    Burns stomach  . Carvedilol Diarrhea and Rash  . Chocolate Rash and Other (See Comments)    Dark  chocolate   No current facility-administered medications on file prior to encounter.   Current Outpatient Medications on File Prior to Encounter  Medication Sig Dispense Refill  . calcium citrate-vitamin D (CITRACAL+D) 315-200 MG-UNIT tablet Take 1 tablet by mouth 2 (two) times daily.    Marland Kitchen acetaminophen (TYLENOL) 650 MG CR tablet Take 650 mg by  mouth every 8 (eight) hours as needed for pain.    Marland Kitchen albuterol (VENTOLIN HFA) 108 (90 Base) MCG/ACT inhaler Inhale 1-2 puffs into the lungs every 6 (six) hours as needed for wheezing or shortness of breath.    . ALPRAZolam (XANAX) 0.5 MG tablet Take 0.5 mg by mouth at bedtime as needed for anxiety.     Marland Kitchen apixaban (ELIQUIS) 5 MG TABS tablet Take 1 tablet (5 mg total) by mouth 2 (two) times daily. 60 tablet 6  . ergocalciferol (VITAMIN D2) 1.25 MG (50000 UT) capsule Take 50,000 Units by mouth every Sunday.    . fluticasone (FLONASE) 50 MCG/ACT nasal spray     . furosemide (LASIX) 40 MG tablet Take 1 tablet (40 mg total) by mouth daily. 90 tablet 1  . omeprazole (PRILOSEC) 20 MG capsule Take 20 mg by mouth daily as needed (for indigestion/stomach issues.).     Marland Kitchen potassium chloride (KLOR-CON) 20 MEQ packet Take 20 mEq by mouth 2 (two) times daily.     . verapamil (CALAN) 40 MG tablet Take 1 tablet (40 mg total) by mouth 3 (three) times daily. 90 tablet 0   Social History   Socioeconomic History  . Marital status: Married    Spouse name: Not on file  . Number of children: Not on file  . Years of education: Not on file  . Highest education level: Not on file  Occupational History  . Not on file  Tobacco Use  . Smoking status: Former Smoker    Packs/day: 1.00    Years: 28.00    Pack years: 28.00    Types: Cigarettes    Quit date: 07/17/1983    Years since quitting: 35.7  . Smokeless tobacco: Never Used  Substance and Sexual Activity  . Alcohol use: No  . Drug use: No  . Sexual activity: Never    Birth control/protection: Post-menopausal  Other Topics Concern  . Not on file  Social History Narrative  . Not on file   Social Determinants of Health   Financial Resource Strain:   . Difficulty of Paying Living Expenses: Not on file  Food Insecurity:   . Worried About Charity fundraiser in the Last Year: Not on file  . Ran Out of Food in the Last Year: Not on file  Transportation  Needs:   . Lack of Transportation (Medical): Not on file  . Lack of Transportation (Non-Medical): Not on file  Physical Activity:   . Days of Exercise per Week: Not on file  . Minutes of Exercise per Session: Not on file  Stress:   . Feeling of Stress : Not on file  Social Connections:   . Frequency of Communication with Friends and Family: Not on file  . Frequency of Social Gatherings with Friends and Family: Not on file  . Attends Religious Services: Not on file  . Active Member of Clubs or Organizations: Not on file  . Attends Archivist Meetings: Not on file  . Marital Status: Not on file  Intimate Partner Violence:   . Fear of Current or Ex-Partner: Not on file  . Emotionally Abused:  Not on file  . Physically Abused: Not on file  . Sexually Abused: Not on file   Family History  Problem Relation Age of Onset  . Cancer Mother   . Aneurysm Father   . Cancer Sister   . Cancer Sister   . Anesthesia problems Neg Hx   . Hypotension Neg Hx   . Malignant hyperthermia Neg Hx   . Pseudochol deficiency Neg Hx   . Colon cancer Neg Hx     OBJECTIVE:  Vitals:   04/13/19 0945  BP: (!) 147/67  Pulse: 94  Resp: 16  Temp: 98 F (36.7 C)  TempSrc: Oral  SpO2: 97%    General appearance: ALERT; in no acute distress.  Head: NCAT Lungs: Normal respiratory effort; scattered expiratory wheezes CV: Irregular rhythm Musculoskeletal: Back Inspection: Skin warm, dry, clear and intact without obvious erythema, effusion, or ecchymosis.  Palpation: Nontender to palpation ROM: FROM active and passive Strength: 5/5 shld abduction, 5/5 shld adduction, 5/5 elbow flexion, 5/5 elbow extension, 5/5 grip strength, 5/5 hip flexion, 5/5 knee abduction, 5/5 knee adduction, 5/5 knee flexion, 5/5 knee extension Abdomen: soft, nondistended, normal active bowel sounds; nontender to palpation; no guarding  Skin: warm and dry Neurologic: Ambulates without difficulty; Sensation intact about the  upper/ lower extremities Psychological: alert and cooperative; normal mood and affect  ASSESSMENT & PLAN:  1. Other constipation   2. Acute left-sided thoracic back pain     Meds ordered this encounter  Medications  . mineral oil enema    Sig: Place 133 mLs (1 enema total) rectally once for 1 dose.    Dispense:  133 mL    Refill:  0    Order Specific Question:   Supervising Provider    Answer:   Raylene Everts [[id number]]  . polyethylene glycol (MIRALAX / GLYCOLAX) 17 g packet    Sig: Take 17 g by mouth daily.    Dispense:  14 each    Refill:  0    Order Specific Question:   Supervising Provider    Answer:   Raylene Everts [[id number]]   Back pain may be secondary to constipation.   Recommend increasing water intake.  Drink at least half your body weight in ounces Increased fiber rich foods in diet Miralax prescribed.  Use as directed Mineral oil enema prescribed.  Use as directed Follow up with PCP as needed Go to the ED if you have any new or worsening symptoms such as increased abdominal pain, nausea, vomiting, if you go 3-4 days without bowel movement, chest pain, shortness of breath, abdomen feels hard or distended, persistent constipation despite medication, etc...  Reviewed expectations re: course of current medical issues. Questions answered. Outlined signs and symptoms indicating need for more acute intervention. Patient verbalized understanding. After Visit Summary given.    Lestine Box, PA-C 04/13/19 [place], PA-C 04/13/19 1020

## 2019-04-13 NOTE — ED Provider Notes (Signed)
[name] EMERGENCY DEPARTMENT Provider Note   CSN: [id number] Arrival date & time: 04/13/19  1403     History Chief Complaint  Patient presents with  . Constipation    Bailey Bishop is a 79 y.o. female.  The history is provided by the patient and medical records. No language interpreter was used.  Constipation    79 year old female with history of atrial fibrillation currently on verapamil, anxiety, arthritis, remote history of breast cancer, hypertension, presenting for evaluation of constipation.  Patient states she normally has normal bowel movement every day around 8:30 in the morning.  For the past 3 days she has not had any bowel movement.  She report feeling constipated but able to pass flatus.  She does not claim any nausea vomiting denies any significant abdominal pain but does endorse lower back pain which she described as a burning sensation.  Pain does not radiates down her leg and there is no associated numbness or weakness.  She attributed her constipation to her medication verapamil that she takes for her atrial fibrillation.  However, states she has been on that medication for the past year.  Patient tries stool softener, and prune juice with suppository without any relief.  No complaints of fever or chills no chest pain shortness of breath no dysuria or difficulty urinating no bowel bladder incontinence or saddle anesthesia.  She reported remote colonoscopy and was normal.  She also mention being evaluated by her cardiologist few days ago and states everything is normal.  She denies any dietary changes.  Past Medical History:  Diagnosis Date  . Anxiety   . Arthritis   . Asthma   . Atrial fibrillation ([place])    a. s/p unsuccessful DCCV in 01/2017 and intolerant to Amiodarone --> Rate-control strategy pursued since b. failed Tikosyn in 11/2017  . Cancer [facility]) 2001   right breast  . Dyspnea   . GERD (gastroesophageal reflux disease)   . Hypertension      Patient Active Problem List   Diagnosis Date Noted  . Iron deficiency anemia   . Benign neoplasm of transverse colon   . Diverticulosis of large intestine without diverticulitis   . Visit for monitoring Tikosyn therapy 12/02/2017  . Atrial fibrillation ([place]) 03/31/2017  . Persistent atrial fibrillation ([place])   . Anticoagulated 10/03/2016  . COPD (chronic obstructive pulmonary disease) ([place]) 10/03/2016  . Hypokalemia 09/24/2016  . Asthma in adult without complication 91/63/8466  . CKD (chronic kidney disease), stage II 09/23/2016  . Hyperglycemia 09/23/2016  . Anxiety   . GERD (gastroesophageal reflux disease)   . Shortness of breath   . Essential hypertension   . Status asthmaticus 07/07/2014    Past Surgical History:  Procedure Laterality Date  . BIOPSY N/A 04/22/2018   Procedure: BIOPSY;  Surgeon: Aviva Signs, MD;  Location: [facility] ENDO SUITE;  Service: Gastroenterology;  Laterality: N/A;  . CARDIOVERSION N/A 02/18/2017   Procedure: CARDIOVERSION;  Surgeon: Josue Hector, MD;  Location: [name] ENDOSCOPY;  Service: Cardiovascular;  Laterality: N/A;  . CARDIOVERSION N/A 12/04/2017   Procedure: CARDIOVERSION;  Surgeon: [name], MD;  Location: [place];  Service: Cardiovascular;  Laterality: N/A;  . CATARACT EXTRACTION W/PHACO  07/23/2011   Procedure: CATARACT EXTRACTION PHACO AND INTRAOCULAR LENS PLACEMENT ([place]);  Surgeon: Williams Che, MD;  Location: [facility] ORS;  Service: Ophthalmology;  Laterality: Right;  CDE:9.96  . CATARACT EXTRACTION W/PHACO Left 11/13/2018   Procedure: CATARACT EXTRACTION PHACO AND INTRAOCULAR LENS PLACEMENT (IOC);  Surgeon: Baruch Goldmann,  MD;  Location: [facility] ORS;  Service: Ophthalmology;  Laterality: Left;  left, CDE: 7.44  . CHOLECYSTECTOMY N/A 02/14/2015   Procedure: LAPAROSCOPIC CHOLECYSTECTOMY;  Surgeon: Aviva Signs, MD;  Location: [facility] ORS;  Service: General;  Laterality: N/A;  . COLONOSCOPY N/A 04/22/2018   Procedure: COLONOSCOPY;  Surgeon:  Aviva Signs, MD;  Location: [facility] ENDO SUITE;  Service: Gastroenterology;  Laterality: N/A;  . ESOPHAGOGASTRODUODENOSCOPY N/A 04/22/2018   Procedure: ESOPHAGOGASTRODUODENOSCOPY (EGD);  Surgeon: Aviva Signs, MD;  Location: [facility] ENDO SUITE;  Service: Gastroenterology;  Laterality: N/A;  . MASTECTOMY     bilateral mastectomy-right breast cancer-left taken by choice  . POLYPECTOMY  04/22/2018   Procedure: POLYPECTOMY;  Surgeon: Aviva Signs, MD;  Location: [facility] ENDO SUITE;  Service: Gastroenterology;;  . SEPTOPLASTY  1995     OB History   No obstetric history on file.     Family History  Problem Relation Age of Onset  . Cancer Mother   . Aneurysm Father   . Cancer Sister   . Cancer Sister   . Anesthesia problems Neg Hx   . Hypotension Neg Hx   . Malignant hyperthermia Neg Hx   . Pseudochol deficiency Neg Hx   . Colon cancer Neg Hx     Social History   Tobacco Use  . Smoking status: Former Smoker    Packs/day: 1.00    Years: 28.00    Pack years: 28.00    Types: Cigarettes    Quit date: 07/17/1983    Years since quitting: 35.7  . Smokeless tobacco: Never Used  Substance Use Topics  . Alcohol use: No  . Drug use: No    Home Medications Prior to Admission medications   Medication Sig Start Date End Date Taking? Authorizing Provider  acetaminophen (TYLENOL) 650 MG CR tablet Take 650 mg by mouth every 8 (eight) hours as needed for pain.    [provider]  albuterol (VENTOLIN HFA) 108 (90 Base) MCG/ACT inhaler Inhale 1-2 puffs into the lungs every 6 (six) hours as needed for wheezing or shortness of breath.    [provider]  ALPRAZolam Duanne Moron) 0.5 MG tablet Take 0.5 mg by mouth at bedtime as needed for anxiety.  06/18/14   [provider]  apixaban (ELIQUIS) 5 MG TABS tablet Take 1 tablet (5 mg total) by mouth 2 (two) times daily. 03/13/19   Herminio Commons, MD  calcium citrate-vitamin D (CITRACAL+D) 315-200 MG-UNIT tablet Take 1 tablet by mouth 2  (two) times daily.    [provider]  ergocalciferol (VITAMIN D2) 1.25 MG (50000 UT) capsule Take 50,000 Units by mouth every Sunday.    [provider]  fluticasone Asencion Islam) 50 MCG/ACT nasal spray  12/26/18   [provider]  furosemide (LASIX) 40 MG tablet Take 1 tablet (40 mg total) by mouth daily. 02/18/19   Evans Lance, MD  mineral oil enema Place 133 mLs (1 enema total) rectally once for 1 dose. 04/13/19 04/13/19  Wurst, Marye Round, PA-C  omeprazole (PRILOSEC) 20 MG capsule Take 20 mg by mouth daily as needed (for indigestion/stomach issues.).  02/14/16   [provider]  polyethylene glycol (MIRALAX / GLYCOLAX) 17 g packet Take 17 g by mouth daily. 04/13/19   Wurst, [place], PA-C  potassium chloride (KLOR-CON) 20 MEQ packet Take 20 mEq by mouth 2 (two) times daily.     [provider]  verapamil (CALAN) 40 MG tablet Take 1 tablet (40 mg total) by mouth 3 (three) times daily. 01/08/19  Ahmed Prima, [place] M, PA-C    Allergies    Cardizem cd [diltiazem hcl er beads], Amiodarone, Metoprolol tartrate, Sulfa antibiotics, Tomato, Carvedilol, and Chocolate  Review of Systems   Review of Systems  Gastrointestinal: Positive for constipation.  All other systems reviewed and are negative.   Physical Exam Updated Vital Signs BP 139/66 (BP Location: Right Arm)   Pulse 89   Temp 98.2 F (36.8 C) (Oral)   Resp 16   Ht 5\' 4"  (1.626 m)   Wt 63.9 kg   SpO2 97%   BMI 24.18 kg/m   Physical Exam Vitals and nursing note reviewed.  Constitutional:      General: She is not in acute distress.    Appearance: She is well-developed.  HENT:     Head: Atraumatic.  Eyes:     Conjunctiva/sclera: Conjunctivae normal.  Cardiovascular:     Rate and Rhythm: Rhythm irregular.     Pulses: Normal pulses.     Heart sounds: Normal heart sounds.  Pulmonary:     Breath sounds: Wheezing present.  Abdominal:     General: Bowel sounds are normal.      Palpations: Abdomen is soft.     Tenderness: There is no abdominal tenderness.  Genitourinary:    Comments: Chaperone present during exam.  Normal rectal tone, no obvious mass, no impacted stool, normal color stool on glove. Musculoskeletal:        General: Tenderness (Mild tenderness to lumbar and paralumbar spinal muscle on palpation.) present.     Cervical back: Neck supple.     Comments: 5 out of 5 strength all 4 extremities.  Skin:    Findings: No rash.  Neurological:     Mental Status: She is alert and oriented to person, place, and time.  Psychiatric:        Mood and Affect: Mood normal.     ED Results / Procedures / Treatments   Labs (all labs ordered are listed, but only abnormal results are displayed) Labs Reviewed  COMPREHENSIVE METABOLIC PANEL - Abnormal; Notable for the following components:      Result Value   Glucose, Bld 107 (*)    Creatinine, Ser 1.22 (*)    GFR calc non Af Amer 42 (*)    GFR calc Af Amer 48 (*)    All other components within normal limits  LIPASE, BLOOD - Abnormal; Notable for the following components:   Lipase 66 (*)    All other components within normal limits  CBC WITH DIFFERENTIAL/PLATELET  URINALYSIS, ROUTINE W REFLEX MICROSCOPIC  POC OCCULT BLOOD, ED    EKG None  Radiology CT ABDOMEN PELVIS W CONTRAST  Result Date: 04/13/2019 CLINICAL DATA:  Generalized gastrointestinal motility disorder with constipation. History of breast cancer. Low back pain and constipation EXAM: CT ABDOMEN AND PELVIS WITH CONTRAST TECHNIQUE: Multidetector CT imaging of the abdomen and pelvis was performed using the standard protocol following bolus administration of intravenous contrast. CONTRAST:  39mL OMNIPAQUE IOHEXOL 300 MG/ML  SOLN COMPARISON:  12/06/2018 FINDINGS: Lower chest: Stable enlarged heart and mild linear scarring at both lung bases. Hepatobiliary: Stable small liver cysts. Stable small right lobe liver calcified granuloma. Stable cholecystectomy  clips. Pancreas: Unremarkable. No pancreatic ductal dilatation or surrounding inflammatory changes. Spleen: Rounded mass in the medial aspect of the spleen with diffuse peripheral enhancement and central low density. This measures 2.3 cm in maximum diameter on image number 14 series 2. This measured 2.2 x 2.0 cm on 02/09/2015. Adrenals/Urinary Tract: Normal appearing  adrenal glands. A lower pole left renal parapelvic cyst is unchanged, measuring 4.1 cm in maximum diameter. A low to medium density oval mass in the medial aspect of the mid to lower left kidney measures 1.3 x 1.2 cm on image number 21 series 7. This was lower in density on 02/09/2015, measuring 1.2 x 1.0 cm at that time. Unremarkable urinary bladder and ureters. Stomach/Bowel: A small hiatal hernia is again demonstrated. Multiple sigmoid and descending colon diverticula without evidence of diverticulitis. Prominent stool in the right and transverse colon. Unremarkable small bowel. Normal appearing appendix. Vascular/Lymphatic: Atheromatous arterial calcifications without aneurysm. No enlarged lymph nodes. Reproductive: Uterus and bilateral adnexa are unremarkable. Other: No abdominal wall hernia or abnormality. No abdominopelvic ascites. Musculoskeletal: Lumbar and lower thoracic spine degenerative changes. IMPRESSION: 1. No acute abnormality. 2. Minimal increase in size of a probable hemangioma in the spleen. 3. Interval mild increase in size of a low to medium density mass in the medial aspect of the mid to lower left kidney, currently measuring 1.3 x 1.2 cm. Based on previous lower density and minimal increase in size over a 4 year period of time, this most likely represents a mildly complicated cyst. A neoplasm is less likely but not excluded. 4. Colonic diverticulosis. 5. Small hiatal hernia. 6. Prominent stool in the right and transverse colon. Electronically Signed   By: Claudie Revering M.D.   On: 04/13/2019 18:55    Procedures Procedures  (including critical care time)  Medications Ordered in ED Medications  iohexol (OMNIPAQUE) 300 MG/ML solution 75 mL (75 mLs Intravenous Contrast Given 04/13/19 1825)  sodium chloride 0.9 % bolus 1,000 mL (1,000 mLs Intravenous New Bag/Given 04/13/19 1847)    ED Course  I have reviewed the triage vital signs and the nursing notes.  Pertinent labs & imaging results that were available during my care of the patient were reviewed by me and considered in my medical decision making (see chart for details).    MDM Rules/Calculators/A&P                      BP (!) 143/98   Pulse (!) 109   Temp 98.2 F (36.8 C) (Oral)   Resp 18   Ht 5\' 4"  (1.626 m)   Wt 63.9 kg   SpO2 95%   BMI 24.18 kg/m   Final Clinical Impression(s) / ED Diagnoses Final diagnoses:  Constipation, unspecified constipation type    Rx / DC Orders ED Discharge Orders         Ordered    polyethylene glycol (MIRALAX / GLYCOLAX) 17 g packet  Daily     04/13/19 1905         5:19 PM Patient report having constipation low back pain for the past 3 days.  She does not have any significant abdominal pain on exam.No stool impaction on exam as well.  Abdomen is soft and nontender.  However, due to her age and her presenting complaint along with remote history of breast cancer, will obtain abdominal pelvic CT scan for further evaluation.   7:03 PM Fecal occult blood test negative, labs are reassuring, mild AKI with creatinine of 1.22, IV fluid given, mildly elevated lipase of 66.  An abdominal pelvis CT scan demonstrated no acute abnormalities.  No evidence of SBO.  Probable hemangioma in the spleen.  There is some density mass to the left lower kidney likely a mildly complicated cyst however neoplasm is less likely but not excluded.  There  are prominent stools in the right and transverse colon.  At this time, I discussed the finding with patient encourage patient to follow-up with PCP for further evaluation.  I encouraged  MiraLAX as it will help her with her constipation.  She should continue with prune juice.  She does not require admission at this time.  Care discussed with Dr. Sabra Heck.   Domenic Moras, PA-C 04/13/19 Joesph Fillers, MD 04/14/19 [phone]

## 2019-04-13 NOTE — Discharge Instructions (Signed)
Back pain may be secondary to constipation.   Recommend increasing water intake.  Drink at least half your body weight in ounces Increased fiber rich foods in diet Miralax prescribed.  Use as directed Mineral oil enema prescribed.  Use as directed Follow up with PCP as needed Go to the ED if you have any new or worsening symptoms such as increased abdominal pain, nausea, vomiting, if you go 3-4 days without bowel movement, chest pain, shortness of breath, abdomen feels hard or distended, persistent constipation despite medication, etc..Marland Kitchen

## 2019-04-13 NOTE — Discharge Instructions (Signed)
CT scan shows that your have a moderate amount of stool in your colon, likely contributing to your constipation.  Take Miralax daily, continue with prune juice and follow up with your doctor for further care.  Stay hydrated.  There is a complicated cyst in your left kidney.  Have it recheck with a repeat CT scan in 3 months to ensure it stay the same size.

## 2019-04-13 NOTE — ED Triage Notes (Signed)
Pt presents to UC w/ c/o left mid/lower back pain x2 days. Pt states she bent over to pick up her purse and felt a sharp pain.   Constipation x3 days. Pt states since taking verapamil 1 year ago, her constipation has worsened. Pt states prune juice, stool softeners, and suppositories usually help, but has not helped the past 3 days.

## 2019-04-13 NOTE — ED Triage Notes (Signed)
Pt reports lower back pain, constipation since Saturday. Pt reports was seen for same this am at urgent care and was prescribed a mineral oil enema and reports no change. Pt denies abd pain, n/v/d.

## 2019-04-13 NOTE — ED Notes (Signed)
Patient transported to CT 

## 2019-04-15 DIAGNOSIS — Z6824 Body mass index (BMI) 24.0-24.9, adult: Secondary | ICD-10-CM | POA: Diagnosis not present

## 2019-04-15 DIAGNOSIS — K5909 Other constipation: Secondary | ICD-10-CM | POA: Diagnosis not present

## 2019-04-15 DIAGNOSIS — M545 Low back pain: Secondary | ICD-10-CM | POA: Diagnosis not present

## 2019-04-19 DIAGNOSIS — K5900 Constipation, unspecified: Secondary | ICD-10-CM | POA: Diagnosis not present

## 2019-04-19 DIAGNOSIS — M549 Dorsalgia, unspecified: Secondary | ICD-10-CM | POA: Diagnosis not present

## 2019-04-19 DIAGNOSIS — Z5321 Procedure and treatment not carried out due to patient leaving prior to being seen by health care provider: Secondary | ICD-10-CM | POA: Diagnosis not present

## 2019-04-19 DIAGNOSIS — I4891 Unspecified atrial fibrillation: Secondary | ICD-10-CM | POA: Diagnosis not present

## 2019-04-21 DIAGNOSIS — Z1389 Encounter for screening for other disorder: Secondary | ICD-10-CM | POA: Diagnosis not present

## 2019-04-21 DIAGNOSIS — N390 Urinary tract infection, site not specified: Secondary | ICD-10-CM | POA: Diagnosis not present

## 2019-04-21 DIAGNOSIS — N342 Other urethritis: Secondary | ICD-10-CM | POA: Diagnosis not present

## 2019-04-21 DIAGNOSIS — I4891 Unspecified atrial fibrillation: Secondary | ICD-10-CM | POA: Diagnosis not present

## 2019-04-21 DIAGNOSIS — Z6824 Body mass index (BMI) 24.0-24.9, adult: Secondary | ICD-10-CM | POA: Diagnosis not present

## 2019-04-27 ENCOUNTER — Other Ambulatory Visit: Payer: Self-pay | Admitting: *Deleted

## 2019-04-27 ENCOUNTER — Encounter: Payer: Self-pay | Admitting: *Deleted

## 2019-04-27 NOTE — Patient Outreach (Signed)
[place] [facility]) Care Management  04/27/2019  [place] 11-07-1940 [id number]   [facility] Telephone Assessment/Screen for [facility] referral (On APL)   Referral Date: 04/20/19 Received on 04/21/19 Referral Source:A Bellamy Medium (contact within 10 business days) Referral Reason: [facility] phone [phone]** Would like to see if she can get co pay assistance for Eliquis Insurance: united healthcare medicare  Last ED visit 04/13/19 for constipation, 12/14/18 for hemorrhagic cystitis Last hospitalization 11/13/18 surgery for nuclear cataract left eye  Outreach attempt #1 successful to [phone] Patient is able to verify HIPAA, DOB and address Reviewed and addressed referral to [facility] with patient  Atrial fibrillation (Afib) Bailey Bishop states she feels good and denies worsening Afib signs and symptoms (s/s) She attends the coumadin clinic in [place] RN CM discussed the difference between [facility], cardiology and Dr Lovena Le, Electrophysiology (specializing in arrhythmias)   Other medical concerns discussed by patient Bailey Bishop reports having 3 "UTIs in the last four months" She reports she is recently recovering from shingles on her left side   Social: Bailey Bishop is a 79 year old female who lives at home with her husband "Junior" [name]). She reports she is doing good except her side effects from her atrial fibrillation medication, hair loss.   Conditions: Persistent atrial fibrillation (cardioversion 02/18/17, 12/04/17), Shingles, Hair loss, 12/14/18 hemorrhagic cystitis, 11/13/18 surgery for nuclear cataract left eye, essential hypertension, status asthmaticus, COPD, GERD (gastroesophageal reflux disease), benign neoplasm of transverse colon, Chronic Kidney disease (CKD) stage II, anxiety, shortness of breath (sob), hyperglycemia, hypokalemia, iron deficiency anemia, diverticulosis of large intestine without diverticulitis,  former smoker (quit 07/17/1983),  height (ht)= 5'4" weight (wt) 137-140 pounds (lbs) Body Mass Index (BMI)=24.16 at 140 lbs   DME: none  Medications: Medication assistance Bailey Heckstall reports she is paying $47/month for Eliquis at this time. She reports she has spoken with her local pharmacy staff and is aware that a generic for Eliquis is pending She attends the coumadin clinic in [place] but reports Dr Bronson Ing provides all her  prescriptions (Rx) for her cardiac medicines She reports a phone contact with Bronson Ing in May 2020 She reports she has more contact with [name], NP/PA at Dr Bronson Ing office  Mr Wurtz shared that they have not been able to qualify previous for medication assistance from social security programs  Side effects She reports having hair to fall out and was informed this is a side effect of Eliquis She denies medication assistance or samples from any MD for her Eliquis  Questions She reports that Dr Hilma Favors was seen last week and in interested in her stopping Verapamil as it may be causing side effects of UTIs She has questions about this    Appointments: primary care provider (PCP) Dr Hilma Favors was seen per pt, last week -week of 04/20/19-04/24/19 10/14/19 [address] [facility] - Follow up (f/u) Eliquis Dr Lovena Le, Cardiology electrophysiology seen in September 2020  Advance Directives: Denies need for assist with changes to present advance directives - Has living will and Power of [place] ([place])    Consent: [facility] RN CM reviewed [name] services with patient. Patient gave verbal consent for services [facility] telephonic RN CM and [facility] pharmacy.   Plan: [facility] RN CM will follow up with the patient in the next 7-10 days- f/u pharmacy referral, further assessment of major medical issues (afib, codp, ckd, htn)  Pt referred to [place] as she has polypharmacy with cost concern  for Eliquis plus questions and side effects related to Eliquis and Verapamil  Pt encouraged to return a call to [place] CM prn  [facility] RN CM sent a successful outreach letter as discussed with [facility] brochure enclosed for review  Routed note to MDs/NP/PA- Barriers letter to MD   [place] Problem One     Most Recent Value  Care Plan Problem One  Medication cost - Eliquis to manage Atrial fibrillation  Role Documenting the Problem One  Care Management Telephonic Coordinator  Care Plan for Problem One  Active  [facility] Long Term Goal   over the next 30 days patient will report managment of atrial fibrillation without cost concern for medicine during follow up contact  [place] Term Goal Start Date  04/27/19  Interventions for Problem One Long Term Goal  completed assessment, referred to [place] CM Short Term Goal #1   over the next 14 days patient will have contact from [place] staff for medication assistance, questions and side effects for Eliquis and verapamil as evidence of EMR documentation and patient verbalizing during follow up outreach  [facility] CM Short Term Goal #1 Start Date  04/27/19  Interventions for Short Term Goal #1  completed assessment, referred to [place]. Lavina Hamman, RN, BSN, [place] Coordinator Office number [phone] Mobile number [phone]  Main [facility] number [id number] Fax number [phone]

## 2019-04-29 ENCOUNTER — Telehealth: Payer: Self-pay | Admitting: Student

## 2019-04-29 ENCOUNTER — Telehealth: Payer: Self-pay | Admitting: Internal Medicine

## 2019-04-29 ENCOUNTER — Telehealth: Payer: Self-pay

## 2019-04-29 NOTE — Telephone Encounter (Signed)
That is fine but we've been limited with options for medical therapy.

## 2019-04-29 NOTE — Telephone Encounter (Signed)
Staff msg sent to both providers.

## 2019-04-29 NOTE — Telephone Encounter (Signed)
Spoke with pt who states that she would like to retry Amiodarone. Please advise

## 2019-04-29 NOTE — Telephone Encounter (Signed)
Pt sates she would like to switch providers from [place] to [facility].

## 2019-04-29 NOTE — Telephone Encounter (Signed)
Spoke with pt who states that she would like to come off verapamil b/c it is causing constipation, UTI's and wheezing. Please advise.

## 2019-04-29 NOTE — Telephone Encounter (Signed)
Pt is having a reaction to her verapamil (CALAN) 40 MG tablet [[id number]]   She's had 3 UTI's in the past 4 months, it is bothering her asthma, and she's also constipated.

## 2019-04-29 NOTE — Telephone Encounter (Signed)
    Notified by Alda Berthold the patient would like to cancel the appointment and follow-up with EP. She is awaiting approval from [name] and Dr. Rayann Heman to switch providers. She has been followed by the [facility] through the years and this is where Amiodarone was initially started in 2018. Would consider follow-up there if unable to see Dr. Rayann Heman in the coming weeks.  Signed, Erma Heritage, PA-C 04/29/2019, 4:40 PM Pager: [phone]

## 2019-04-29 NOTE — Telephone Encounter (Signed)
Pt husband calling to say that they would like to have his wife retry Amiodarone again. States that he feels that they " jump the gun " when stopping it last time. And woul like to give it a 2 month try. Appt made for 1pm with [place] PA-C. Please advise.

## 2019-04-29 NOTE — Telephone Encounter (Signed)
Pt notified and given number to Afib clinic.

## 2019-04-29 NOTE — Telephone Encounter (Signed)
Pt's Spouse [name]) would like call from [place].  Thanks renee

## 2019-04-29 NOTE — Telephone Encounter (Signed)
Pt is wanting to change to another provider- she is wanting to see someone other than [name].   Please let pt know which provider she would switch to.   [phone]

## 2019-04-30 ENCOUNTER — Other Ambulatory Visit (HOSPITAL_COMMUNITY): Payer: Self-pay | Admitting: *Deleted

## 2019-04-30 ENCOUNTER — Other Ambulatory Visit: Payer: Self-pay | Admitting: Pharmacist

## 2019-04-30 MED ORDER — POTASSIUM CHLORIDE 20 MEQ PO PACK: 20.0000 meq | PACK | Freq: Every day | ORAL | 3 refills | Status: DC

## 2019-04-30 MED ORDER — POTASSIUM CHLORIDE CRYS ER 20 MEQ PO TBCR: 20.0000 meq | EXTENDED_RELEASE_TABLET | Freq: Every day | ORAL | 3 refills | Status: DC

## 2019-04-30 MED ORDER — AMIODARONE HCL 200 MG PO TABS: 200.0000 mg | ORAL_TABLET | Freq: Every day | ORAL | 3 refills | Status: DC

## 2019-04-30 NOTE — Telephone Encounter (Signed)
Discussed medication change from verapamil due to severe constipation her PCP believes is a result of verapamil. [name] NP stated pt could retry Amiodarone for rate control and stop verapamil. Will start at Amiodarone 200mg  once a day with food. Pt will need EKG in 1 week to ensure rate controlled. Pt would prefer this be done in [place] due to transportation - will contact their office and have EKG faxed to [name] NP for review. Pt in agreement. Instructed pt to contact GI if her constipation issues continue.

## 2019-04-30 NOTE — Telephone Encounter (Signed)
Would recommend she speak with [name] regarding this.

## 2019-04-30 NOTE — Patient Outreach (Signed)
[facility]) Care Management  [place]   04/30/2019  Bailey Bishop 1940/11/01 [id number]  Reason for referral: Medication Assistance, Medication Review  Referral source: [name] RN Current insurance: [facility]  PMHx includes but not limited to:  Persistent atrial fibrillation (s/p unsuccessful DCCV 11/18, intolerant to amiodarone, failed Tikosyn) on anticoagulation, hx hemorrhagic cystitis, CHF, HTN, CKD-III, GERD,   Per notes, patient has questions about side effects of Eliquis and Verapamil.  She was switched from long acting to short acting verapamil in Oct 202 due to concern for side effects, now is to re-trial amiodarone.    Outreach:  Successful telephone call with patient.  HIPAA identifiers verified.   Subjective:  Patient denies any issues with medication cost at this time.  She also denies having any questions or concerns with medications.  She is agreeable to review medications.   Objective: The ASCVD Risk score Mikey Bussing DC Jr., et al., 2013) failed to calculate for the following reasons:   The 2013 ASCVD risk score is only valid for ages 21 to 48  Lab Results  Component Value Date   CREATININE 1.22 (H) 04/13/2019   CREATININE 1.11 (H) 12/06/2018   CREATININE 1.30 (H) 12/19/2017    Lab Results  Component Value Date   HGBA1C 4.9 09/23/2016    Lipid Panel     Component Value Date/Time   CHOL 109 09/24/2016 0438   TRIG 66 09/24/2016 0438   HDL 34 (L) 09/24/2016 0438   CHOLHDL 3.2 09/24/2016 0438   VLDL 13 09/24/2016 0438   LDLCALC 62 09/24/2016 0438    BP Readings from Last 3 Encounters:  04/13/19 (!) 143/98  04/13/19 (!) 147/67  02/10/19 (!) 163/92    Allergies  Allergen Reactions  . Cardizem Cd [Diltiazem Hcl Er Beads] Diarrhea  . Amiodarone Nausea And Vomiting  . Metoprolol Tartrate Diarrhea  . Sulfa Antibiotics Nausea And Vomiting  . Tomato Other (See Comments)    Burns stomach  . Carvedilol Diarrhea and  Rash  . Chocolate Rash and Other (See Comments)    Dark chocolate    Medications Reviewed Today    Reviewed by Barbaraann Faster, RN (Registered Nurse) on 04/27/19 at 1124  Med List Status: <None>  Medication Order Taking? Sig Documenting Provider Last Dose Status Informant  acetaminophen (TYLENOL) 650 MG CR tablet [id number]  Take 650 mg by mouth every 8 (eight) hours as needed for pain. [provider]  Active Self  albuterol (PROVENTIL) 4 MG tablet [id number]   [provider]  Active   albuterol (VENTOLIN HFA) 108 (90 Base) MCG/ACT inhaler [id number]  Inhale 1-2 puffs into the lungs every 6 (six) hours as needed for wheezing or shortness of breath. [provider]  Active Self  ALPRAZolam Duanne Moron) 0.5 MG tablet [id number]  Take 0.5 mg by mouth at bedtime as needed for anxiety.  [provider]  Active Self           Med Note Chuck Hint   Tue Jan 22, 2017  3:12 PM)    apixaban (ELIQUIS) 5 MG TABS tablet [id number]  Take 1 tablet (5 mg total) by mouth 2 (two) times daily. Herminio Commons, MD  Active   calcium citrate-vitamin D (CITRACAL+D) 315-200 MG-UNIT tablet [id number]  Take 1 tablet by mouth 2 (two) times daily. [provider]  Active   ergocalciferol (VITAMIN D2) 1.25 MG (50000 UT) capsule [id number]  Take 50,000 Units by mouth every Sunday. [provider]  Active Self  fluticasone (FLONASE) 50 MCG/ACT nasal spray [id number]   [provider]  Active   furosemide (LASIX) 40 MG tablet [id number]  Take 1 tablet (40 mg total) by mouth daily. Evans Lance, MD  Active   nitrofurantoin, macrocrystal-monohydrate, (MACROBID) 100 MG capsule [id number]  Take 100 mg by mouth 2 (two) times daily. [provider]  Active   omeprazole (PRILOSEC) 20 MG capsule [id number]  Take 20 mg by mouth daily as needed (for indigestion/stomach issues.).  [provider]  Active Self  polyethylene glycol (MIRALAX / GLYCOLAX) 17 g  packet [id number]  Take 17 g by mouth daily. Domenic Moras, PA-C  Active   potassium chloride (KLOR-CON) 20 MEQ packet [id number]  Take 20 mEq by mouth 2 (two) times daily.  [provider]  Active Self  verapamil (CALAN) 40 MG tablet [id number]  Take 1 tablet (40 mg total) by mouth 3 (three) times daily. Ahmed Prima, [place] M, PA-C  Active   verapamil (CALAN-SR) 120 MG CR tablet [id number]   [provider]  Active           Assessment: Drugs sorted by system:  Neurologic/Psychologic: alprazolam  Hematologic: apixaban  Cardiovascular: furosemide, amiodarone  Pulmonary/Allergy: albuterol inhaler, fluticasone NS  Gastrointestinal: omeprazole  Pain: acetaminophen  Vitamins/Minerals/Supplements: Calcium + Vitamin D, potassium   Medication Review Findings:   Patient aware of medication change from Verapamil --> Amiodarone, will switch over tomorrow, she reports allergy listed to amiodarone of N/V was due to taking on empty stomach, she will make sure to take with food now  No longer taking Miralax as she thinks she will not have constipation since she will be stopping verapamil   Only taking potassium once daily rather than BID, reports providers are aware and following potassium level closely however borderline low 3.6 (04/13/19).  Updated patient on this most recent labwork and she states she will go back to taking BID as she is to have labs checked again next week. She will f/u with provider on potassium dosing.     Medication Assistance Findings:  No medication assistance needs identified   Plan: . Will close [facility] pharmacy case as no further medication needs identified at this time.  Am happy to assist in the future as needed.     Ralene Bathe, PharmD, [place] [phone]

## 2019-05-01 ENCOUNTER — Ambulatory Visit: Payer: Medicare Other | Admitting: Student

## 2019-05-05 ENCOUNTER — Other Ambulatory Visit: Payer: Self-pay | Admitting: *Deleted

## 2019-05-05 ENCOUNTER — Other Ambulatory Visit: Payer: Self-pay

## 2019-05-05 NOTE — Patient Outreach (Signed)
[facility]) Care Management  05/05/2019  [place] Jan 03, 1941 [id number]   [facility] follow up Telephone Assessment/Screen for [name] UM referral (On APL)   Referral Date: 04/20/19 Received on 04/21/19 Referral Source:A [place] Medium (contact within 10 business days) Referral Reason: [name] phone [phone]** Would like to see if she can get co pay assistance for Eliquis Insurance: united healthcare medicare  Last ED visit 04/13/19 for constipation, 12/14/18 for hemorrhagic cystitis Last hospitalization 11/13/18 surgery for nuclear cataract left eye  Outreach attempt #1 successful to [phone] Patient is able to verify HIPAA, DOB and address Reviewed reason for follow up to see if had contact from [place] or other identifiable needs   Eliquis and medications Mrs Walgren confirms a contact from [place] staff She reports she makes "just little more than needed to qualify" She reports she will be able to manage the cost of Eliquis until a generic "comes out"  [facility] RN CM assessed and offer to provided education on recurrent UTIs  Mrs Mccreery states she feels she is okay and has not had one in a few months seen her primary care provider (PCP) stopped verapamil.  She denied the need of UTI EMMI education.  "we are both of sound mind and body at this time" She denies all other medical concerns but agrees to follow up in 90 days   Appointments: primary care provider (PCP) Dr Hilma Favors was seen per pt, last week -week of 04/20/19-04/24/19 10/14/19 [address] [facility] - Follow up (f/u) Eliquis Dr Lovena Le, Cardiology electrophysiology seen in September 2020  Consent: [facility] RN CM reviewed [facility] services with patient. Patient gave verbal consent for services [facility] telephonic RN CM and [name] pharmacy.   Plan: [facility] RN CM will follow up with the patient in the next 90 days for follow up on status of health conditions and any identifiable needs  Pt encouraged to  return a call to [place] CM prn  Routed note to MDs/NP/PA  [facility] CM Care Plan Problem One     Most Recent Value  Care Plan Problem One  Medication cost - Eliquis to manage Atrial fibrillation  Role Documenting the Problem One  Care Management Telephonic Coordinator  Care Plan for Problem One  Active  [facility]N Long Term Goal   over the next 120 days patient will report managment of atrial fibrillation without cost concern for medicine during follow up contact  [place] Term Goal Start Date  04/27/19  Interventions for Problem One Long Term Goal  assessed and offer to provided education on recurrent UTIs assessed for other medical needs   THN CM Short Term Goal #1   over the next 14 days patient will have contact from [place] staff for medication assistance, questions and side effects for Eliquis and verapamil as evidence of EMR documentation and patient verbalizing during follow up outreach  [facility] CM Short Term Goal #1 Start Date  04/27/19  [facility] CM Short Term Goal #1 Met Date  05/05/19     Joelene Millin L. Lavina Hamman, RN, BSN, [place] Coordinator Office number [phone] Mobile number [phone]  Main [facility] number [phone] Fax number [phone]

## 2019-05-07 ENCOUNTER — Ambulatory Visit (INDEPENDENT_AMBULATORY_CARE_PROVIDER_SITE_OTHER): Payer: Medicare Other | Admitting: *Deleted

## 2019-05-07 VITALS — HR 120 | Temp 97.8°F | Ht 64.0 in

## 2019-05-07 DIAGNOSIS — I482 Chronic atrial fibrillation, unspecified: Secondary | ICD-10-CM

## 2019-05-07 NOTE — Progress Notes (Signed)
Pt in office for EKG and states that she is feeling better after restarting Amiodarone.

## 2019-05-23 ENCOUNTER — Other Ambulatory Visit: Payer: Self-pay

## 2019-05-23 ENCOUNTER — Encounter (HOSPITAL_COMMUNITY): Payer: Self-pay

## 2019-05-23 ENCOUNTER — Emergency Department (HOSPITAL_COMMUNITY)
Admission: EM | Admit: 2019-05-23 | Discharge: 2019-05-23 | Disposition: A | Discharge status: Home / Self Care | Payer: Medicare Other | Attending: Emergency Medicine | Admitting: Emergency Medicine

## 2019-05-23 ENCOUNTER — Emergency Department (HOSPITAL_COMMUNITY): Payer: Medicare Other

## 2019-05-23 DIAGNOSIS — Y999 Unspecified external cause status: Secondary | ICD-10-CM | POA: Diagnosis not present

## 2019-05-23 DIAGNOSIS — N182 Chronic kidney disease, stage 2 (mild): Secondary | ICD-10-CM | POA: Insufficient documentation

## 2019-05-23 DIAGNOSIS — Z79899 Other long term (current) drug therapy: Secondary | ICD-10-CM | POA: Diagnosis not present

## 2019-05-23 DIAGNOSIS — I129 Hypertensive chronic kidney disease with stage 1 through stage 4 chronic kidney disease, or unspecified chronic kidney disease: Secondary | ICD-10-CM | POA: Insufficient documentation

## 2019-05-23 DIAGNOSIS — R109 Unspecified abdominal pain: Secondary | ICD-10-CM | POA: Diagnosis not present

## 2019-05-23 DIAGNOSIS — Z87891 Personal history of nicotine dependence: Secondary | ICD-10-CM | POA: Diagnosis not present

## 2019-05-23 DIAGNOSIS — R1032 Left lower quadrant pain: Secondary | ICD-10-CM | POA: Diagnosis present

## 2019-05-23 DIAGNOSIS — Y9389 Activity, other specified: Secondary | ICD-10-CM | POA: Diagnosis not present

## 2019-05-23 DIAGNOSIS — X58XXXA Exposure to other specified factors, initial encounter: Secondary | ICD-10-CM | POA: Diagnosis not present

## 2019-05-23 DIAGNOSIS — I4891 Unspecified atrial fibrillation: Secondary | ICD-10-CM | POA: Diagnosis not present

## 2019-05-23 DIAGNOSIS — J449 Chronic obstructive pulmonary disease, unspecified: Secondary | ICD-10-CM | POA: Diagnosis not present

## 2019-05-23 DIAGNOSIS — S22000A Wedge compression fracture of unspecified thoracic vertebra, initial encounter for closed fracture: Secondary | ICD-10-CM

## 2019-05-23 DIAGNOSIS — Z7901 Long term (current) use of anticoagulants: Secondary | ICD-10-CM | POA: Insufficient documentation

## 2019-05-23 DIAGNOSIS — Y9289 Other specified places as the place of occurrence of the external cause: Secondary | ICD-10-CM | POA: Diagnosis not present

## 2019-05-23 DIAGNOSIS — S22078A Other fracture of T9-T10 vertebra, initial encounter for closed fracture: Secondary | ICD-10-CM | POA: Insufficient documentation

## 2019-05-23 DIAGNOSIS — S22070A Wedge compression fracture of T9-T10 vertebra, initial encounter for closed fracture: Secondary | ICD-10-CM | POA: Diagnosis not present

## 2019-05-23 DIAGNOSIS — J45909 Unspecified asthma, uncomplicated: Secondary | ICD-10-CM | POA: Insufficient documentation

## 2019-05-23 LAB — COMPREHENSIVE METABOLIC PANEL
ALT: 29 U/L (ref 0–44)
AST: 29 U/L (ref 15–41)
Albumin: 4 g/dL (ref 3.5–5.0)
Alkaline Phosphatase: 119 U/L (ref 38–126)
Anion gap: 13 (ref 5–15)
BUN: 23 mg/dL (ref 8–23)
CO2: 26 mmol/L (ref 22–32)
Calcium: 9 mg/dL (ref 8.9–10.3)
Chloride: 99 mmol/L (ref 98–111)
Creatinine, Ser: 1.41 mg/dL — ABNORMAL HIGH (ref 0.44–1.00)
GFR calc Af Amer: 41 mL/min — ABNORMAL LOW (ref >60)
GFR calc non Af Amer: 35 mL/min — ABNORMAL LOW (ref >60)
Glucose, Bld: 99 mg/dL (ref 70–99)
Potassium: 3.5 mmol/L (ref 3.5–5.1)
Sodium: 138 mmol/L (ref 135–145)
Total Bilirubin: 1.1 mg/dL (ref 0.3–1.2)
Total Protein: 7.8 g/dL (ref 6.5–8.1)

## 2019-05-23 LAB — CBC WITH DIFFERENTIAL/PLATELET
Abs Immature Granulocytes: 0.06 10*3/uL (ref 0.00–0.07)
Basophils Absolute: 0 10*3/uL (ref 0.0–0.1)
Basophils Relative: 0 %
Eosinophils Absolute: 0.2 10*3/uL (ref 0.0–0.5)
Eosinophils Relative: 2 %
HCT: 39.5 % (ref 36.0–46.0)
Hemoglobin: 12.6 g/dL (ref 12.0–15.0)
Immature Granulocytes: 1 %
Lymphocytes Relative: 28 %
Lymphs Abs: 3.4 10*3/uL (ref 0.7–4.0)
MCH: 31.5 pg (ref 26.0–34.0)
MCHC: 31.9 g/dL (ref 30.0–36.0)
MCV: 98.8 fL (ref 80.0–100.0)
Monocytes Absolute: 1.1 10*3/uL — ABNORMAL HIGH (ref 0.1–1.0)
Monocytes Relative: 9 %
Neutro Abs: 7.3 10*3/uL (ref 1.7–7.7)
Neutrophils Relative %: 60 %
Platelets: 297 10*3/uL (ref 150–400)
RBC: 4 MIL/uL (ref 3.87–5.11)
RDW: 15.1 % (ref 11.5–15.5)
WBC: 12.1 10*3/uL — ABNORMAL HIGH (ref 4.0–10.5)
nRBC: 0 % (ref 0.0–0.2)

## 2019-05-23 LAB — URINALYSIS, ROUTINE W REFLEX MICROSCOPIC
Bilirubin Urine: NEGATIVE
Glucose, UA: NEGATIVE mg/dL
Hgb urine dipstick: NEGATIVE
Ketones, ur: NEGATIVE mg/dL
Leukocytes,Ua: NEGATIVE
Nitrite: NEGATIVE
Protein, ur: NEGATIVE mg/dL
Specific Gravity, Urine: 1.004 — ABNORMAL LOW (ref 1.005–1.030)
pH: 7 (ref 5.0–8.0)

## 2019-05-23 LAB — TROPONIN I (HIGH SENSITIVITY)
Troponin I (High Sensitivity): 23 ng/L — ABNORMAL HIGH (ref <18)
Troponin I (High Sensitivity): 28 ng/L — ABNORMAL HIGH (ref <18)

## 2019-05-23 MED ORDER — DILTIAZEM HCL-DEXTROSE 125-5 MG/125ML-% IV SOLN (PREMIX)
5.0000 mg/h | INTRAVENOUS | Status: DC
  Administered 2019-05-23: 5 mg/h via INTRAVENOUS
  Filled 2019-05-23: qty 125

## 2019-05-23 MED ORDER — KETOROLAC TROMETHAMINE 30 MG/ML IJ SOLN
30.0000 mg | Freq: Once | INTRAMUSCULAR | Status: AC
  Administered 2019-05-23: 30 mg via INTRAVENOUS
  Filled 2019-05-23: qty 1

## 2019-05-23 MED ORDER — DILTIAZEM LOAD VIA INFUSION
10.0000 mg | Freq: Once | INTRAVENOUS | Status: AC
  Administered 2019-05-23: 09:00:00 10 mg via INTRAVENOUS
  Filled 2019-05-23: qty 10

## 2019-05-23 NOTE — Discharge Instructions (Addendum)
Take your verapamil  40 mg four times a day.  Schedule appointment to see your cardiologist for evaluation

## 2019-05-23 NOTE — ED Provider Notes (Signed)
[place] Provider Note   CSN: [id number] Arrival date & time: 05/23/19  3474     History Chief Complaint  Patient presents with  . Flank Pain    Bailey Bishop is a 79 y.o. female.  The history is provided by the patient. No language interpreter was used.  Back Pain Location:  Thoracic spine Quality:  Aching Radiates to:  Does not radiate Pain severity:  Moderate Onset quality:  Gradual Progression:  Waxing and waning Chronicity:  New Relieved by:  Nothing Worsened by:  Nothing Ineffective treatments:  None tried Associated symptoms: no fever   Risk factors: no vascular disease        Past Medical History:  Diagnosis Date  . Anxiety   . Arthritis   . Asthma   . Atrial fibrillation ([place])    a. s/p unsuccessful DCCV in 01/2017 and intolerant to Amiodarone --> Rate-control strategy pursued since b. failed Tikosyn in 11/2017  . Cancer [facility]) 2001   right breast  . Dyspnea   . GERD (gastroesophageal reflux disease)   . Hypertension     Patient Active Problem List   Diagnosis Date Noted  . Iron deficiency anemia   . Benign neoplasm of transverse colon   . Diverticulosis of large intestine without diverticulitis   . Visit for monitoring Tikosyn therapy 12/02/2017  . Atrial fibrillation ([place]) 03/31/2017  . Persistent atrial fibrillation ([place])   . Anticoagulated 10/03/2016  . COPD (chronic obstructive pulmonary disease) ([place]) 10/03/2016  . Hypokalemia 09/24/2016  . Asthma in adult without complication 25/95/6387  . CKD (chronic kidney disease), stage II 09/23/2016  . Hyperglycemia 09/23/2016  . Anxiety   . GERD (gastroesophageal reflux disease)   . Shortness of breath   . Essential hypertension   . Status asthmaticus 07/07/2014    Past Surgical History:  Procedure Laterality Date  . BIOPSY N/A 04/22/2018   Procedure: BIOPSY;  Surgeon: Aviva Signs, MD;  [facility] END[facility];  Service: Gastroenterology;  Laterality: N/A;  .  CARDIOVERSION N/A 02/18/2017   Procedure: CARDIOVERSION;  Surgeon: Josue Hector, MD;  Location: [facility] ENDOSCOPY;  Service: Cardiovascular;  Laterality: N/A;  . CARDIOVERSION N/A 12/04/2017   Procedure: CARDIOVERSION;  Surgeon: [name], MD;  Location: [place];  Service: Cardiovascular;  Laterality: N/A;  . CATARACT EXTRACTION W/PHACO  07/23/2011   Procedure: CATARACT EXTRACTION PHACO AND INTRAOCULAR LENS PLACEMENT ([place]);  Surgeon: Williams Che, MD;  Location: [facility] ORS;  Service: Ophthalmology;  Laterality: Right;  CDE:9.96  . CATARACT EXTRACTION W/PHACO Left 11/13/2018   Procedure: CATARACT EXTRACTION PHACO AND INTRAOCULAR LENS PLACEMENT (IOC);  Surgeon: Baruch Goldmann, MD;  Location: [facility] ORS;  Service: Ophthalmology;  Laterality: Left;  left, CDE: 7.44  . CHOLECYSTECTOMY N/A 02/14/2015   Procedure: LAPAROSCOPIC CHOLECYSTECTOMY;  Surgeon: Aviva Signs, MD;  Location: [facility] ORS;  Service: General;  Laterality: N/A;  . COLONOSCOPY N/A 04/22/2018   Procedure: COLONOSCOPY;  Surgeon: Aviva Signs, MD;  Location: [facility] ENDO [facility];  Service: Gastroenterology;  Laterality: N/A;  . ESOPHAGOGASTRODUODENOSCOPY N/A 04/22/2018   Procedure: ESOPHAGOGASTRODUODENOSCOPY (EGD);  Surgeon: Aviva Signs, MD;  Location[facility] END[facility];  Service: Gastroenterology;  Laterality: N/A;  . MASTECTOMY     bilateral mastectomy-right breast cancer-left taken by choice  . POLYPECTOMY  04/22/2018   Procedure: POLYPECTOMY;  Surgeon: Aviva Signs, MD;  Location: [facility] END[facility];  Service: Gastroenterology;;  . SEPTOPLASTY  1995     OB History   No obstetric history on file.  Family History  Problem Relation Age of Onset  . Cancer Mother   . Aneurysm Father   . Cancer Sister   . Cancer Sister   . Anesthesia problems Neg Hx   . Hypotension Neg Hx   . Malignant hyperthermia Neg Hx   . Pseudochol deficiency Neg Hx   . Colon cancer Neg Hx     Social History   Tobacco Use  . Smoking status: Former Smoker     Packs/day: 1.00    Years: 28.00    Pack years: 28.00    Types: Cigarettes    Quit date: 07/17/1983    Years since quitting: 35.8  . Smokeless tobacco: Never Used  Substance Use Topics  . Alcohol use: No  . Drug use: No    Home Medications Prior to Admission medications   Medication Sig Start Date End Date Taking? Authorizing Provider  acetaminophen (TYLENOL) 650 MG CR tablet Take 650 mg by mouth every 8 (eight) hours as needed for pain.    [provider]  albuterol (VENTOLIN HFA) 108 (90 Base) MCG/ACT inhaler Inhale 1-2 puffs into the lungs every 6 (six) hours as needed for wheezing or shortness of breath.    [provider]  ALPRAZolam Duanne Moron) 0.5 MG tablet Take 0.5 mg by mouth at bedtime as needed for anxiety.  06/18/14   [provider]  amiodarone (PACERONE) 200 MG tablet Take 1 tablet (200 mg total) by mouth daily. 04/30/19   Sherran Needs, NP  apixaban (ELIQUIS) 5 MG TABS tablet Take 1 tablet (5 mg total) by mouth 2 (two) times daily. 03/13/19   Herminio Commons, MD  calcium citrate-vitamin D (CITRACAL+D) 315-200 MG-UNIT tablet Take 1 tablet by mouth 2 (two) times daily.    [provider]  fluticasone (FLONASE) 50 MCG/ACT nasal spray Place 1 spray into both nostrils as needed.  12/26/18   [provider]  furosemide (LASIX) 40 MG tablet Take 1 tablet (40 mg total) by mouth daily. 02/18/19   Evans Lance, MD  omeprazole (PRILOSEC) 20 MG capsule Take 20 mg by mouth daily as needed (for indigestion/stomach issues.).  02/14/16   [provider]  polyethylene glycol (MIRALAX / GLYCOLAX) 17 g packet Take 17 g by mouth daily. 04/13/19   Domenic Moras, PA-C  potassium chloride SA (KLOR-CON) 20 MEQ tablet Take 1 tablet (20 mEq total) by mouth daily. 04/30/19   Sherran Needs, NP  valACYclovir (VALTREX) 1000 MG tablet Take 1,000 mg by mouth 3 (three) times daily. 05/16/19   [provider]    Allergies    Cardizem cd  [diltiazem hcl er beads], Amiodarone, Metoprolol tartrate, Sulfa antibiotics, Tomato, Carvedilol, and Chocolate  Review of Systems   Review of Systems  Constitutional: Negative for fever.  Musculoskeletal: Positive for back pain.  All other systems reviewed and are negative.   Physical Exam Updated Vital Signs BP (!) 185/122 (BP Location: Right Arm)   Pulse (!) 122   Temp 98.2 F (36.8 C) (Oral)   Resp 18   Ht 5\' 4"  (1.626 m)   Wt 62.1 kg   SpO2 98%   BMI 23.52 kg/m   Physical Exam Vitals and nursing note reviewed.  Constitutional:      Appearance: She is well-developed.  HENT:     Head: Normocephalic.     Right Ear: Tympanic membrane normal.     Mouth/Throat:     Mouth: Mucous membranes are moist.  Eyes:  Pupils: Pupils are equal, round, and reactive to light.  Neck:     Comments: Tender thoracic spine, pain with movement  Cardiovascular:     Rate and Rhythm: Normal rate.  Pulmonary:     Effort: Pulmonary effort is normal.  Abdominal:     General: Abdomen is flat. There is no distension.  Musculoskeletal:        General: Normal range of motion.     Cervical back: Normal range of motion.  Skin:    General: Skin is warm.  Neurological:     Mental Status: She is alert and oriented to person, place, and time. Mental status is at baseline.  Psychiatric:        Mood and Affect: Mood normal.     ED Results / Procedures / Treatments   Labs (all labs ordered are listed, but only abnormal results are displayed) Labs Reviewed  CBC WITH DIFFERENTIAL/PLATELET  COMPREHENSIVE METABOLIC PANEL  URINALYSIS, ROUTINE W REFLEX MICROSCOPIC    EKG None  Radiology No results found.  Procedures .Critical Care Performed by: Fransico Meadow, PA-C Authorized by: Fransico Meadow, PA-C   Critical care provider statement:    Critical care time (minutes):  45   Critical care start time:  05/23/2019 11:00 AM   Critical care end time:  05/23/2019 3:48 PM   Critical care  time was exclusive of:  Separately billable procedures and treating other patients   Critical care was necessary to treat or prevent imminent or life-threatening deterioration of the following conditions:  Cardiac failure, circulatory failure and CNS failure or compromise   Critical care was time spent personally by me on the following activities:  Blood draw for specimens, development of treatment plan with patient or surrogate, discussions with consultants and examination of patient   I assumed direction of critical care for this patient from another provider in my specialty: no     (including critical care time)  Medications Ordered in ED Medications - No data to display  ED Course  I have reviewed the triage vital signs and the nursing notes.  Pertinent labs & imaging results that were available during my care of the patient were reviewed by me and considered in my medical decision making (see chart for details).    MDM Rules/Calculators/A&P                      MDM:  Ct scan shows thoracic compression fracture.  EKG shows afib.  Pt given cardizem iv.  Pt returned to a normal rate and rhythm.   I spoke to Dr. Harrington Challenger cardiology.  She advised verapamil 40 mg 4 times a day.  (Pt states she does not want to take)  Pt reports it makes her constipated.  Pt advised tylenol for pain.  Pt advised to see her cardiologist for medication management.  Final Clinical Impression(s) / ED Diagnoses Final diagnoses:  Compression fracture of body of thoracic vertebra (HCC)  Atrial fibrillation, unspecified type ([place])    Rx / DC Orders ED Discharge Orders    None    An After Visit Summary was printed and given to the patient.   Fransico Meadow, PA-C 05/23/19 1600    Milton Ferguson, MD 05/24/19 [phone]

## 2019-05-23 NOTE — ED Notes (Signed)
Pt transported to CT ?

## 2019-05-23 NOTE — ED Triage Notes (Signed)
Pt reports diagnosed with internal shingles 2 months ago and has been on 4 rounds of valacyclovir.  Pt c/o severe pain to left flank area.  Denies any rash or blisters.

## 2019-05-23 NOTE — ED Notes (Signed)
Returned from CT.

## 2019-06-05 ENCOUNTER — Encounter (HOSPITAL_COMMUNITY): Payer: Self-pay

## 2019-06-05 ENCOUNTER — Other Ambulatory Visit: Payer: Self-pay

## 2019-06-05 ENCOUNTER — Emergency Department (HOSPITAL_COMMUNITY): Payer: Medicare Other

## 2019-06-05 ENCOUNTER — Emergency Department (HOSPITAL_COMMUNITY)
Admission: EM | Admit: 2019-06-05 | Discharge: 2019-06-06 | Disposition: A | Discharge status: Home / Self Care | Payer: Medicare Other | Attending: Emergency Medicine | Admitting: Emergency Medicine

## 2019-06-05 DIAGNOSIS — S22070D Wedge compression fracture of T9-T10 vertebra, subsequent encounter for fracture with routine healing: Secondary | ICD-10-CM | POA: Diagnosis not present

## 2019-06-05 DIAGNOSIS — X509XXA Other and unspecified overexertion or strenuous movements or postures, initial encounter: Secondary | ICD-10-CM | POA: Insufficient documentation

## 2019-06-05 DIAGNOSIS — Z853 Personal history of malignant neoplasm of breast: Secondary | ICD-10-CM | POA: Diagnosis not present

## 2019-06-05 DIAGNOSIS — S22070A Wedge compression fracture of T9-T10 vertebra, initial encounter for closed fracture: Secondary | ICD-10-CM | POA: Diagnosis not present

## 2019-06-05 DIAGNOSIS — Y929 Unspecified place or not applicable: Secondary | ICD-10-CM | POA: Insufficient documentation

## 2019-06-05 DIAGNOSIS — M5136 Other intervertebral disc degeneration, lumbar region: Secondary | ICD-10-CM | POA: Diagnosis not present

## 2019-06-05 DIAGNOSIS — Y9389 Activity, other specified: Secondary | ICD-10-CM | POA: Insufficient documentation

## 2019-06-05 DIAGNOSIS — R52 Pain, unspecified: Secondary | ICD-10-CM | POA: Diagnosis not present

## 2019-06-05 DIAGNOSIS — Z79899 Other long term (current) drug therapy: Secondary | ICD-10-CM | POA: Insufficient documentation

## 2019-06-05 DIAGNOSIS — S22000A Wedge compression fracture of unspecified thoracic vertebra, initial encounter for closed fracture: Secondary | ICD-10-CM | POA: Insufficient documentation

## 2019-06-05 DIAGNOSIS — J449 Chronic obstructive pulmonary disease, unspecified: Secondary | ICD-10-CM | POA: Diagnosis not present

## 2019-06-05 DIAGNOSIS — S29002A Unspecified injury of muscle and tendon of back wall of thorax, initial encounter: Secondary | ICD-10-CM | POA: Diagnosis present

## 2019-06-05 DIAGNOSIS — N182 Chronic kidney disease, stage 2 (mild): Secondary | ICD-10-CM | POA: Diagnosis not present

## 2019-06-05 DIAGNOSIS — M5489 Other dorsalgia: Secondary | ICD-10-CM | POA: Diagnosis not present

## 2019-06-05 DIAGNOSIS — I129 Hypertensive chronic kidney disease with stage 1 through stage 4 chronic kidney disease, or unspecified chronic kidney disease: Secondary | ICD-10-CM | POA: Insufficient documentation

## 2019-06-05 DIAGNOSIS — Y998 Other external cause status: Secondary | ICD-10-CM | POA: Insufficient documentation

## 2019-06-05 DIAGNOSIS — Z7901 Long term (current) use of anticoagulants: Secondary | ICD-10-CM | POA: Diagnosis not present

## 2019-06-05 DIAGNOSIS — I10 Essential (primary) hypertension: Secondary | ICD-10-CM | POA: Diagnosis not present

## 2019-06-05 DIAGNOSIS — M4854XA Collapsed vertebra, not elsewhere classified, thoracic region, initial encounter for fracture: Secondary | ICD-10-CM | POA: Diagnosis not present

## 2019-06-05 DIAGNOSIS — R000 Tachycardia, unspecified: Secondary | ICD-10-CM | POA: Diagnosis not present

## 2019-06-05 DIAGNOSIS — Z87891 Personal history of nicotine dependence: Secondary | ICD-10-CM | POA: Insufficient documentation

## 2019-06-05 MED ORDER — KETOROLAC TROMETHAMINE 60 MG/2ML IM SOLN
15.0000 mg | Freq: Once | INTRAMUSCULAR | Status: AC
  Administered 2019-06-05: 15 mg via INTRAMUSCULAR
  Filled 2019-06-05: qty 2

## 2019-06-05 MED ORDER — CYCLOBENZAPRINE HCL 5 MG PO TABS: 5.0000 mg | ORAL_TABLET | Freq: Three times a day (TID) | ORAL | 0 refills | Status: DC | PRN

## 2019-06-05 MED ORDER — DIAZEPAM 2 MG PO TABS
2.0000 mg | ORAL_TABLET | Freq: Once | ORAL | Status: AC
  Administered 2019-06-05: 2 mg via ORAL
  Filled 2019-06-05: qty 1

## 2019-06-05 MED ORDER — OXYCODONE-ACETAMINOPHEN 5-325 MG PO TABS
1.0000 | ORAL_TABLET | Freq: Once | ORAL | Status: AC
  Administered 2019-06-05: 1 via ORAL
  Filled 2019-06-05: qty 1

## 2019-06-05 NOTE — Discharge Instructions (Addendum)
Please follow-up with your primary care doctor regarding ongoing management of your back pain.  Return to ER if you develop numbness, weakness, bladder or bowel incontinence, abdominal pain, fever or other new concerning symptom.

## 2019-06-05 NOTE — ED Notes (Signed)
Pt to xr 

## 2019-06-05 NOTE — ED Provider Notes (Signed)
[facility] EMERGENCY DEPARTMENT Provider Note   CSN: [id number] Arrival date & time: 06/05/19  2028     History Chief Complaint  Patient presents with  . Back Pain    Bailey Bishop is a 79 y.o. female.  Presented to ER with chief complaint of back pain.  States she was doing well since she was seen in the emergency department last time, however today she bent over to pick something up from the ground and twisted in an awkward position and felt something pop.  No fall.  No new trauma.  No associated numbness, weakness, bladder or bowel incontinence, fever.  No abdominal pain.  No nausea or vomiting.  Had been taking some Tylenol and Advil at home with no improvement.  Pain currently 10-10 in severity, sharp, stabbing.  HPI     Past Medical History:  Diagnosis Date  . Anxiety   . Arthritis   . Asthma   . Atrial fibrillation ([place])    a. s/p unsuccessful DCCV in 01/2017 and intolerant to Amiodarone --> Rate-control strategy pursued since b. failed Tikosyn in 11/2017  . Cancer [facility]) 2001   right breast  . Dyspnea   . GERD (gastroesophageal reflux disease)   . Hypertension     Patient Active Problem List   Diagnosis Date Noted  . Iron deficiency anemia   . Benign neoplasm of transverse colon   . Diverticulosis of large intestine without diverticulitis   . Visit for monitoring Tikosyn therapy 12/02/2017  . Atrial fibrillation ([place]) 03/31/2017  . Persistent atrial fibrillation ([place])   . Anticoagulated 10/03/2016  . COPD (chronic obstructive pulmonary disease) ([place]) 10/03/2016  . Hypokalemia 09/24/2016  . Asthma in adult without complication 27/05/5007  . CKD (chronic kidney disease), stage II 09/23/2016  . Hyperglycemia 09/23/2016  . Anxiety   . GERD (gastroesophageal reflux disease)   . Shortness of breath   . Essential hypertension   . Status asthmaticus 07/07/2014    Past Surgical History:  Procedure Laterality Date  . BIOPSY N/A 04/22/2018   Procedure:  BIOPSY;  Surgeon: Aviva Signs, MD;  Location: [facility] ENDO [facility];  Service: Gastroenterology;  Laterality: N/A;  . CARDIOVERSION N/A 02/18/2017   Procedure: CARDIOVERSION;  Surgeon: Josue Hector, MD;  Location: [facility] ENDOSCOPY;  Service: Cardiovascular;  Laterality: N/A;  . CARDIOVERSION N/A 12/04/2017   Procedure: CARDIOVERSION;  Surgeon: [name], MD;  Location: [place];  Service: Cardiovascular;  Laterality: N/A;  . CATARACT EXTRACTION W/PHACO  07/23/2011   Procedure: CATARACT EXTRACTION PHACO AND INTRAOCULAR LENS PLACEMENT ([place]);  Surgeon: Williams Che, MD;  Location: [facility] ORS;  Service: Ophthalmology;  Laterality: Right;  CDE:9.96  . CATARACT EXTRACTION W/PHACO Left 11/13/2018   Procedure: CATARACT EXTRACTION PHACO AND INTRAOCULAR LENS PLACEMENT (IOC);  Surgeon: Baruch Goldmann, MD;  Location: [facility] ORS;  Service: Ophthalmology;  Laterality: Left;  left, CDE: 7.44  . CHOLECYSTECTOMY N/A 02/14/2015   Procedure: LAPAROSCOPIC CHOLECYSTECTOMY;  Surgeon: Aviva Signs, MD;  Location: [facility] ORS;  Service: General;  Laterality: N/A;  . COLONOSCOPY N/A 04/22/2018   Procedure: COLONOSCOPY;  Surgeon: Aviva Signs, MD;  Location: [facility] ENDO [facility]ITE;  Service: Gastroenterology;  Laterality: N/A;  . ESOPHAGOGASTRODUODENOSCOPY N/A 04/22/2018   Procedure: ESOPHAGOGASTRODUODENOSCOPY (EGD);  Surgeon: Aviva Signs, MD;  Location[facility] ENDO [facility];  Service: Gastroenterology;  Laterality: N/A;  . MASTECTOMY     bilateral mastectomy-right breast cancer-left taken by choice  . POLYPECTOMY  04/22/2018   Procedure: POLYPECTOMY;  Surgeon: Aviva Signs, MD;  Location: [facility]  ENDO SUITE;  Service: Gastroenterology;;  . SEPTOPLASTY  1995     OB History   No obstetric history on file.     Family History  Problem Relation Age of Onset  . Cancer Mother   . Aneurysm Father   . Cancer Sister   . Cancer Sister   . Anesthesia problems Neg Hx   . Hypotension Neg Hx   . Malignant hyperthermia Neg Hx   . Pseudochol  deficiency Neg Hx   . Colon cancer Neg Hx     Social History   Tobacco Use  . Smoking status: Former Smoker    Packs/day: 1.00    Years: 28.00    Pack years: 28.00    Types: Cigarettes    Quit date: 07/17/1983    Years since quitting: 35.9  . Smokeless tobacco: Never Used  Substance Use Topics  . Alcohol use: No  . Drug use: No    Home Medications Prior to Admission medications   Medication Sig Start Date End Date Taking? Authorizing Provider  acetaminophen (TYLENOL) 650 MG CR tablet Take 650 mg by mouth every 8 (eight) hours as needed for pain.    [provider]  albuterol (VENTOLIN HFA) 108 (90 Base) MCG/ACT inhaler Inhale 1-2 puffs into the lungs every 6 (six) hours as needed for wheezing or shortness of breath.    [provider]  ALPRAZolam Duanne Moron) 0.5 MG tablet Take 0.5 mg by mouth at bedtime as needed for anxiety.  06/18/14   [provider]  amiodarone (PACERONE) 200 MG tablet Take 1 tablet (200 mg total) by mouth daily. 04/30/19   Sherran Needs, NP  apixaban (ELIQUIS) 5 MG TABS tablet Take 1 tablet (5 mg total) by mouth 2 (two) times daily. 03/13/19   Herminio Commons, MD  calcium citrate-vitamin D (CITRACAL+D) 315-200 MG-UNIT tablet Take 1 tablet by mouth 2 (two) times daily.    [provider]  cyclobenzaprine (FLEXERIL) 5 MG tablet Take 1 tablet (5 mg total) by mouth 3 (three) times daily as needed for muscle spasms. 06/05/19   Lucrezia Starch, MD  fluticasone (FLONASE) 50 MCG/ACT nasal spray Place 1 spray into both nostrils as needed.  12/26/18   [provider]  furosemide (LASIX) 40 MG tablet Take 1 tablet (40 mg total) by mouth daily. 02/18/19   Evans Lance, MD  omeprazole (PRILOSEC) 20 MG capsule Take 20 mg by mouth daily as needed (for indigestion/stomach issues.).  02/14/16   [provider]  polyethylene glycol (MIRALAX / GLYCOLAX) 17 g packet Take 17 g by mouth daily. 04/13/19   Domenic Moras, PA-C    potassium chloride SA (KLOR-CON) 20 MEQ tablet Take 1 tablet (20 mEq total) by mouth daily. 04/30/19   Sherran Needs, NP  valACYclovir (VALTREX) 1000 MG tablet Take 1,000 mg by mouth 3 (three) times daily. 05/16/19   [provider]    Allergies    Cardizem cd [diltiazem hcl er beads], Amiodarone, Metoprolol tartrate, Sulfa antibiotics, Tomato, Carvedilol, and Chocolate  Review of Systems   Review of Systems  Constitutional: Negative for chills and fever.  HENT: Negative for ear pain and sore throat.   Eyes: Negative for pain and visual disturbance.  Respiratory: Negative for cough and shortness of breath.   Cardiovascular: Negative for chest pain and palpitations.  Gastrointestinal: Negative for abdominal pain and vomiting.  Genitourinary: Negative for dysuria and hematuria.  Musculoskeletal: Positive for back pain. Negative for arthralgias.  Skin: Negative  for color change and rash.  Neurological: Negative for seizures and syncope.  All other systems reviewed and are negative.   Physical Exam Updated Vital Signs BP (!) 158/93 (BP Location: Left Arm)   Pulse (!) 109   Temp 98.2 F (36.8 C) (Oral)   Resp 16   Ht 5\' 4"  (1.626 m)   Wt 63.1 kg   SpO2 99%   BMI 23.89 kg/m   Physical Exam Vitals and nursing note reviewed.  Constitutional:      General: She is not in acute distress.    Appearance: She is well-developed.  HENT:     Head: Normocephalic and atraumatic.  Eyes:     Conjunctiva/sclera: Conjunctivae normal.  Cardiovascular:     Rate and Rhythm: Normal rate and regular rhythm.     Heart sounds: No murmur.  Pulmonary:     Effort: Pulmonary effort is normal. No respiratory distress.     Breath sounds: Normal breath sounds.  Abdominal:     Palpations: Abdomen is soft.     Tenderness: There is no abdominal tenderness.  Musculoskeletal:        General: No deformity.     Cervical back: Neck supple.     Comments: Tenderness palpation over upper L-spine,  no deformity noted  Skin:    General: Skin is warm and dry.  Neurological:     General: No focal deficit present.     Mental Status: She is alert.     Comments: 5 out of 5 strength in lower extremities  Psychiatric:        Mood and Affect: Mood normal.        Behavior: Behavior normal.     ED Results / Procedures / Treatments   Labs (all labs ordered are listed, but only abnormal results are displayed) Labs Reviewed - No data to display  EKG None  Radiology DG Lumbar Spine Complete  Result Date: 06/05/2019 CLINICAL DATA:  Known T10 compression fracture EXAM: LUMBAR SPINE - COMPLETE 4+ VIEW COMPARISON:  05/23/2019 FINDINGS: Stable inferior endplate T10 compression fracture is seen. Vertebral body height is otherwise well maintained. Minimal degenerative anterolisthesis of L4 on L5 is noted. Disc space narrowing at L4-5 and L5-S1 is seen. No pars defects are noted. Facet hypertrophic changes are seen. No soft tissue abnormality is noted. IMPRESSION: Stable T10 compression fracture. Mild degenerative change as described. Electronically Signed   By: Inez Catalina M.D.   On: 06/05/2019 21:13    Procedures Procedures (including critical care time)  Medications Ordered in ED Medications  oxyCODONE-acetaminophen (PERCOCET/ROXICET) 5-325 MG per tablet 1 tablet (1 tablet Oral Given 06/05/19 2234)  ketorolac (TORADOL) injection 15 mg (15 mg Intramuscular Given 06/05/19 2304)  diazepam (VALIUM) tablet 2 mg (2 mg Oral Given 06/05/19 2304)    ED Course  I have reviewed the triage vital signs and the nursing notes.  Pertinent labs & imaging results that were available during my care of the patient were reviewed by me and considered in my medical decision making (see chart for details).    MDM Rules/Calculators/A&P                      79 year old lady presenting to ER with back pain.  Given description of symptoms, physical exam, suspect MSK strain.  Plain film x-ray demonstrated stable  T10 compression fracture.  Suspect this is likely source of her pain.  I provided patient with symptomatic control.  Patient had great improvement in  pain, ambulating without difficulty.  Neurovascularly intact.  Recommend close follow-up with primary doctor, will give brief Rx for low-dose muscle relaxer.    After the discussed management above, the patient was determined to be safe for discharge.  The patient was in agreement with this plan and all questions regarding their care were answered.  ED return precautions were discussed and the patient will return to the ED with any significant worsening of condition.   Final Clinical Impression(s) / ED Diagnoses Final diagnoses:  Compression fracture of body of thoracic vertebra ([place])    Rx / DC Orders ED Discharge Orders         Ordered    cyclobenzaprine (FLEXERIL) 5 MG tablet  3 times daily PRN     06/05/19 2358           Lucrezia Starch, MD 06/06/19 0000

## 2019-06-05 NOTE — ED Triage Notes (Signed)
Pt has back pain from recent fracture. Said she was seen recently for it and sent home to take tylenol . This am she sat up and felt her back pop and thinks she reinjured it.

## 2019-06-06 ENCOUNTER — Emergency Department (HOSPITAL_COMMUNITY)
Admission: EM | Admit: 2019-06-06 | Discharge: 2019-06-06 | Disposition: A | Discharge status: Home / Self Care | Payer: Medicare Other | Source: Home / Self Care | Attending: Emergency Medicine | Admitting: Emergency Medicine

## 2019-06-06 ENCOUNTER — Emergency Department (HOSPITAL_COMMUNITY): Payer: Medicare Other

## 2019-06-06 ENCOUNTER — Encounter (HOSPITAL_COMMUNITY): Payer: Self-pay | Admitting: Emergency Medicine

## 2019-06-06 DIAGNOSIS — M5136 Other intervertebral disc degeneration, lumbar region: Secondary | ICD-10-CM | POA: Diagnosis not present

## 2019-06-06 DIAGNOSIS — S22080A Wedge compression fracture of T11-T12 vertebra, initial encounter for closed fracture: Secondary | ICD-10-CM | POA: Diagnosis not present

## 2019-06-06 DIAGNOSIS — S22070D Wedge compression fracture of T9-T10 vertebra, subsequent encounter for fracture with routine healing: Secondary | ICD-10-CM | POA: Diagnosis not present

## 2019-06-06 DIAGNOSIS — S22000A Wedge compression fracture of unspecified thoracic vertebra, initial encounter for closed fracture: Secondary | ICD-10-CM

## 2019-06-06 LAB — CBC WITH DIFFERENTIAL/PLATELET
Abs Immature Granulocytes: 0.02 10*3/uL (ref 0.00–0.07)
Basophils Absolute: 0 10*3/uL (ref 0.0–0.1)
Basophils Relative: 0 %
Eosinophils Absolute: 0.4 10*3/uL (ref 0.0–0.5)
Eosinophils Relative: 4 %
HCT: 35.4 % — ABNORMAL LOW (ref 36.0–46.0)
Hemoglobin: 11.2 g/dL — ABNORMAL LOW (ref 12.0–15.0)
Immature Granulocytes: 0 %
Lymphocytes Relative: 12 %
Lymphs Abs: 1.1 10*3/uL (ref 0.7–4.0)
MCH: 31.8 pg (ref 26.0–34.0)
MCHC: 31.6 g/dL (ref 30.0–36.0)
MCV: 100.6 fL — ABNORMAL HIGH (ref 80.0–100.0)
Monocytes Absolute: 0.8 10*3/uL (ref 0.1–1.0)
Monocytes Relative: 9 %
Neutro Abs: 6.6 10*3/uL (ref 1.7–7.7)
Neutrophils Relative %: 75 %
Platelets: 235 10*3/uL (ref 150–400)
RBC: 3.52 MIL/uL — ABNORMAL LOW (ref 3.87–5.11)
RDW: 15.2 % (ref 11.5–15.5)
WBC: 8.9 10*3/uL (ref 4.0–10.5)
nRBC: 0 % (ref 0.0–0.2)

## 2019-06-06 LAB — BASIC METABOLIC PANEL
Anion gap: 11 (ref 5–15)
BUN: 19 mg/dL (ref 8–23)
CO2: 28 mmol/L (ref 22–32)
Calcium: 8.6 mg/dL — ABNORMAL LOW (ref 8.9–10.3)
Chloride: 100 mmol/L (ref 98–111)
Creatinine, Ser: 1.58 mg/dL — ABNORMAL HIGH (ref 0.44–1.00)
GFR calc Af Amer: 35 mL/min — ABNORMAL LOW (ref >60)
GFR calc non Af Amer: 31 mL/min — ABNORMAL LOW (ref >60)
Glucose, Bld: 105 mg/dL — ABNORMAL HIGH (ref 70–99)
Potassium: 3.2 mmol/L — ABNORMAL LOW (ref 3.5–5.1)
Sodium: 139 mmol/L (ref 135–145)

## 2019-06-06 MED ORDER — OXYCODONE-ACETAMINOPHEN 5-325 MG PO TABS: 2.0000 | ORAL_TABLET | ORAL | 0 refills | Status: DC | PRN

## 2019-06-06 MED ORDER — OXYCODONE-ACETAMINOPHEN 5-325 MG PO TABS
1.0000 | ORAL_TABLET | Freq: Once | ORAL | Status: AC
  Administered 2019-06-06: 1 via ORAL
  Filled 2019-06-06: qty 1

## 2019-06-06 MED ORDER — OXYCODONE HCL 5 MG PO TABS: 2.5000 mg | ORAL_TABLET | Freq: Four times a day (QID) | ORAL | 0 refills | Status: DC | PRN

## 2019-06-06 MED ORDER — DOCUSATE SODIUM 100 MG PO CAPS: 100.0000 mg | ORAL_CAPSULE | Freq: Two times a day (BID) | ORAL | 0 refills | Status: DC

## 2019-06-06 NOTE — ED Notes (Signed)
To CT via stretcher

## 2019-06-06 NOTE — Discharge Instructions (Addendum)
Get help right away if: Your pain is very bad and it suddenly gets worse. You are unable to move any part of your body (paralysis) that is below the level of your injury. You have numbness, tingling, or weakness in any part of your body that is below the level of your injury. You cannot control your bladder or bowels.

## 2019-06-06 NOTE — ED Notes (Signed)
TLSO Brace has been ordered and will be delivered soon.

## 2019-06-06 NOTE — ED Triage Notes (Signed)
Pt states the pain in her back is unbearable. Seen last night for t10 compression fx. No new injury. Sent by insurance triage nurse for admission for pain control per pt.

## 2019-06-06 NOTE — ED Notes (Signed)
Pt is usually ambulatory   States she hurts so badly she cannot walk

## 2019-06-06 NOTE — ED Provider Notes (Signed)
[facility] EMERGENCY DEPARTMENT Provider Note   CSN: [id number] Arrival date & time: 06/06/19  1605     History Chief Complaint  Patient presents with  . Back Pain    Bailey Bishop is a 79 y.o. female who returns to the ED within 24 hours for severe back pain. She has a known T-10 compression fracture. She bent over yesterday had severe worsening in her pain.  She had a repeat x-ray that showed no loss of vertebral height last night, had good relief with pain control here in the emergency department was sent home with cyclobenzaprine but states that it is not touching her pain.  Patient is tearful and states that every movement causes severe pain that radiates around her both of her ribs.  She denies any weakness or numbness in the lower extremities.  She denies any bladder retention, loss of bowel or bladder control, numbness in the groin region.  HPI     Past Medical History:  Diagnosis Date  . Anxiety   . Arthritis   . Asthma   . Atrial fibrillation ([place])    a. s/p unsuccessful DCCV in 01/2017 and intolerant to Amiodarone --> Rate-control strategy pursued since b. failed Tikosyn in 11/2017  . Cancer [facility]) 2001   right breast  . Dyspnea   . GERD (gastroesophageal reflux disease)   . Hypertension     Patient Active Problem List   Diagnosis Date Noted  . Iron deficiency anemia   . Benign neoplasm of transverse colon   . Diverticulosis of large intestine without diverticulitis   . Visit for monitoring Tikosyn therapy 12/02/2017  . Atrial fibrillation ([place]) 03/31/2017  . Persistent atrial fibrillation ([place])   . Anticoagulated 10/03/2016  . COPD (chronic obstructive pulmonary disease) ([place]) 10/03/2016  . Hypokalemia 09/24/2016  . Asthma in adult without complication 24/11/7351  . CKD (chronic kidney disease), stage II 09/23/2016  . Hyperglycemia 09/23/2016  . Anxiety   . GERD (gastroesophageal reflux disease)   . Shortness of breath   . Essential hypertension     . Status asthmaticus 07/07/2014    Past Surgical History:  Procedure Laterality Date  . BIOPSY N/A 04/22/2018   Procedure: BIOPSY;  Surgeon: Aviva Signs, MD;  [facility] ENDO [facility];  Service: Gastroenterology;  Laterality: N/A;  . CARDIOVERSION N/A 02/18/2017   Procedure: CARDIOVERSION;  Surgeon: Josue Hector, MD;  Location: [facility] ENDOSCOPY;  Service: Cardiovascular;  Laterality: N/A;  . CARDIOVERSION N/A 12/04/2017   Procedure: CARDIOVERSION;  Surgeon: [name], MD;  Location: [place];  Service: Cardiovascular;  Laterality: N/A;  . CATARACT EXTRACTION W/PHACO  07/23/2011   Procedure: CATARACT EXTRACTION PHACO AND INTRAOCULAR LENS PLACEMENT ([place]);  Surgeon: Williams Che, MD;  Location: [facility] ORS;  Service: Ophthalmology;  Laterality: Right;  CDE:9.96  . CATARACT EXTRACTION W/PHACO Left 11/13/2018   Procedure: CATARACT EXTRACTION PHACO AND INTRAOCULAR LENS PLACEMENT (IOC);  Surgeon: Baruch Goldmann, MD;  Location: [facility] ORS;  Service: Ophthalmology;  Laterality: Left;  left, CDE: 7.44  . CHOLECYSTECTOMY N/A 02/14/2015   Procedure: LAPAROSCOPIC CHOLECYSTECTOMY;  Surgeon: Aviva Signs, MD;  Location: [facility] ORS;  Service: General;  Laterality: N/A;  . COLONOSCOPY N/A 04/22/2018   Procedure: COLONOSCOPY;  Surgeon: Aviva Signs, MD;  Location[facility] ENDO [facility];  Service: Gastroenterology;  Laterality: N/A;  . ESOPHAGOGASTRODUODENOSCOPY N/A 04/22/2018   Procedure: ESOPHAGOGASTRODUODENOSCOPY (EGD);  Surgeon: Aviva Signs, MD;  Location[facility] ENDO [facility];  Service: Gastroenterology;  Laterality: N/A;  . MASTECTOMY     bilateral  mastectomy-right breast cancer-left taken by choice  . POLYPECTOMY  04/22/2018   Procedure: POLYPECTOMY;  Surgeon: Aviva Signs, MD;  Location: [facility] ENDO SUITE;  Service: Gastroenterology;;  . SEPTOPLASTY  1995     OB History   No obstetric history on file.     Family History  Problem Relation Age of Onset  . Cancer Mother   . Aneurysm Father   . Cancer Sister    . Cancer Sister   . Anesthesia problems Neg Hx   . Hypotension Neg Hx   . Malignant hyperthermia Neg Hx   . Pseudochol deficiency Neg Hx   . Colon cancer Neg Hx     Social History   Tobacco Use  . Smoking status: Former Smoker    Packs/day: 1.00    Years: 28.00    Pack years: 28.00    Types: Cigarettes    Quit date: 07/17/1983    Years since quitting: 35.9  . Smokeless tobacco: Never Used  Substance Use Topics  . Alcohol use: No  . Drug use: No    Home Medications Prior to Admission medications   Medication Sig Start Date End Date Taking? Authorizing Provider  acetaminophen (TYLENOL) 650 MG CR tablet Take 650 mg by mouth every 8 (eight) hours as needed for pain.    [provider]  albuterol (VENTOLIN HFA) 108 (90 Base) MCG/ACT inhaler Inhale 1-2 puffs into the lungs every 6 (six) hours as needed for wheezing or shortness of breath.    [provider]  ALPRAZolam Duanne Moron) 0.5 MG tablet Take 0.5 mg by mouth at bedtime as needed for anxiety.  06/18/14   [provider]  amiodarone (PACERONE) 200 MG tablet Take 1 tablet (200 mg total) by mouth daily. 04/30/19   Sherran Needs, NP  apixaban (ELIQUIS) 5 MG TABS tablet Take 1 tablet (5 mg total) by mouth 2 (two) times daily. 03/13/19   Herminio Commons, MD  calcium citrate-vitamin D (CITRACAL+D) 315-200 MG-UNIT tablet Take 1 tablet by mouth 2 (two) times daily.    [provider]  cyclobenzaprine (FLEXERIL) 5 MG tablet Take 1 tablet (5 mg total) by mouth 3 (three) times daily as needed for muscle spasms. 06/05/19   Lucrezia Starch, MD  fluticasone (FLONASE) 50 MCG/ACT nasal spray Place 1 spray into both nostrils as needed.  12/26/18   [provider]  furosemide (LASIX) 40 MG tablet Take 1 tablet (40 mg total) by mouth daily. 02/18/19   Evans Lance, MD  omeprazole (PRILOSEC) 20 MG capsule Take 20 mg by mouth daily as needed (for indigestion/stomach issues.).  02/14/16   [provider]  polyethylene glycol (MIRALAX / GLYCOLAX) 17 g packet Take 17 g by mouth daily. 04/13/19   Domenic Moras, PA-C  potassium chloride SA (KLOR-CON) 20 MEQ tablet Take 1 tablet (20 mEq total) by mouth daily. 04/30/19   Sherran Needs, NP  valACYclovir (VALTREX) 1000 MG tablet Take 1,000 mg by mouth 3 (three) times daily. 05/16/19   [provider]    Allergies    Cardizem cd [diltiazem hcl er beads], Amiodarone, Metoprolol tartrate, Sulfa antibiotics, Tomato, Carvedilol, and Chocolate  Review of Systems   Review of Systems Ten systems reviewed and are negative for acute change, except as noted in the HPI.   Physical Exam Updated Vital Signs BP (!) 145/91   Pulse (!) 118   Ht 5\' 5"  (1.651 m)   Wt 63.1 kg   SpO2 100%  BMI 23.15 kg/m   Physical Exam Vitals and nursing note reviewed.  Constitutional:      General: She is not in acute distress.    Appearance: She is well-developed. She is not diaphoretic.  HENT:     Head: Normocephalic and atraumatic.  Eyes:     General: No scleral icterus.    Conjunctiva/sclera: Conjunctivae normal.  Cardiovascular:     Rate and Rhythm: Normal rate and regular rhythm.     Heart sounds: Normal heart sounds. No murmur. No friction rub. No gallop.   Pulmonary:     Effort: Pulmonary effort is normal. No respiratory distress.     Breath sounds: Normal breath sounds.  Abdominal:     General: Bowel sounds are normal. There is no distension.     Palpations: Abdomen is soft. There is no mass.     Tenderness: There is no abdominal tenderness. There is no guarding.  Musculoskeletal:     Cervical back: Normal range of motion. No bony tenderness.     Thoracic back: Spasms and tenderness present. No bony tenderness. Decreased range of motion.     Lumbar back: Spasms and tenderness present. No bony tenderness. Decreased range of motion.       Back:     Comments: Severe pain with any change in position.  Normal extremity strength in the  legs, normal DP and PT pulse bilaterally with normal sensation.  Skin:    General: Skin is warm and dry.  Neurological:     Mental Status: She is alert and oriented to person, place, and time.  Psychiatric:        Behavior: Behavior normal.     ED Results / Procedures / Treatments   Labs (all labs ordered are listed, but only abnormal results are displayed) Labs Reviewed  CBC WITH DIFFERENTIAL/PLATELET    EKG None  Radiology DG Lumbar Spine Complete  Result Date: 06/05/2019 CLINICAL DATA:  Known T10 compression fracture EXAM: LUMBAR SPINE - COMPLETE 4+ VIEW COMPARISON:  05/23/2019 FINDINGS: Stable inferior endplate T10 compression fracture is seen. Vertebral body height is otherwise well maintained. Minimal degenerative anterolisthesis of L4 on L5 is noted. Disc space narrowing at L4-5 and L5-S1 is seen. No pars defects are noted. Facet hypertrophic changes are seen. No soft tissue abnormality is noted. IMPRESSION: Stable T10 compression fracture. Mild degenerative change as described. Electronically Signed   By: Inez Catalina M.D.   On: 06/05/2019 21:13    Procedures Procedures (including critical care time)  Medications Ordered in ED Medications - No data to display  ED Course  I have reviewed the triage vital signs and the nursing notes.  Pertinent labs & imaging results that were available during my care of the patient were reviewed by me and considered in my medical decision making (see chart for details).    MDM Rules/Calculators/A&P                      Patient fitted in TLSO splint with only minimal pain relief.  She is asking for pain control.  I gave the patient single oral Percocet.  She is having significant difficulty moving and ambulating.  Given that she did not have any improvement with TLSO splinting will further evaluate to make sure that she did not have worsening of her T10 compression fracture.  Patient here with worsening back pain after twisting with  known T10 compression fracture.  Her plain film showed no height loss the other day  however she has had severe pain unrelieved at home with Flexeril.  TLSO seem to only offer minimal relief at first however after giving her a Percocet she has had significant relief in her pain.  I reviewed the patient's labs which show mildly low potassium.  Baseline renal insufficiency.  Mild macrocytic anemia of insignificant value.  Repeat CT of the thoracic and lumbar spine shows new acute endplate fractures at P29 and 12 in addition to stable T10 compression fracture without vertebral height loss.  Patient is feeling significantly better here.  She is able to ambulate.  She feels comfortable in her TLSO splint which she feels is helping her significantly.  Patient will be discharged with a prescription for oxycodone 5 mg which she may take 2.5 to 5 mg every 6 hours for severe pain.  I have also ordered bowel therapy with Colace 100 mg twice daily she is instructed to follow closely with neurosurgery for further management of her thoracic spine fractures.  Patient appears otherwise appropriate for discharge at this time.  She has been seen and shared visit with Dr. Laverta Baltimore who agrees with work-up and plan for discharge at this time. Final Clinical Impression(s) / ED Diagnoses Final diagnoses:  None    Rx / DC Orders ED Discharge Orders    None       Margarita Mail, PA-C 06/06/19 2153    Margette Fast, MD 06/07/19 1324

## 2019-06-06 NOTE — ED Notes (Signed)
Call to vendor regarding brace

## 2019-06-06 NOTE — ED Notes (Signed)
Here last might Seen for back pain  Since January has had a compression Fx  Bent over last pm and increased pain since then   "sent home with some pills to get me thru the weekend" but they have not helped   [employer] nurse who "told me to come here and get admitted for pain control"

## 2019-06-06 NOTE — Progress Notes (Signed)
Orthopedic Tech Progress Note Patient Details:  Bailey Bishop 12-01-1940 [id number] Called in order to [employer] for a TLSO [name], female   DOB: Feb 08, 1941, 79 y.o.   MRN: [id number]   Janit Pagan 06/06/2019, 5:44 PM

## 2019-06-08 ENCOUNTER — Other Ambulatory Visit: Payer: Self-pay | Admitting: *Deleted

## 2019-06-08 MED FILL — Oxycodone w/ Acetaminophen Tab 5-325 MG: ORAL | Qty: 6 | Status: AC

## 2019-06-08 NOTE — Patient Outreach (Addendum)
[name]) Care Management  06/08/2019  [place] 1940/09/20 [id number]  [facility] outreach attempt unsuccessful after ED visits, nurse line referral , active with [facility] for 04/20/19 [facility] UM referral  Mrs Leija denied need of EMMI education for UTI, constipation (in her 04/13/19 ED after summary sheets) and [facility] UM assistance on 05/05/19  Nurse call center notes Patient called in. States she went to ED last night with back pain, XR showed a fractured vertebrae in her back which confirms a CT scan she had done prior to this visit. ED told her to do a F/U visit to her PCP.  ED RX given was cyclobenzaprine 5 mg which she says is not helping her pain. Pain is 10/10. Has been dealing with this pain/fracture since January 19th. Has tried heat and cold and neither works, she says the pain is deep in her back. Denies numbness or weakness in her lower extremities. Suggested she call her PCP office but she says when she calls the phone service tells her to call 911, suggested she go back to the ED for pain control but she refuses to go back to the ED. Suggested a Urgent care which she says is covered. She said she will try that option.  Recommended cold or heat locally   No [facility] admissions in the last 6 months  [place] visits x 5 in the last 6 months 3/13/21AP ED back pain severe back pain after twisting, know T-10 compression fracture, CT showing  new acute endplate fractures at E93 and 12 in addition to stable T10 closed wedge compression fracture without vertebral height loss, TLSO splint, pain control 06/05/19 [facility] ED back pain after bending, felt something pop, xray= stable T10 compression fracture, pain control 05/23/19 [facility] ED flank pain, CT scan shows thoracic compression fracture, EKG shows Afib  04/13/19 [facility] ED constipation with low back pain  04/13/19 [name] urgent care center left mid back pain secondary to constipation    Outreach attempt # 1 No answer. [facility] RN CM left HIPAA [facility] Portability and Accountability Act) compliant voicemail message along with CM's contact info.   Plan: [facility] RN CM sent an unsuccessful outreach letter and scheduled this patient for another call attempt within 4 business days  Routed to MD  [place]. Lavina Hamman, RN, BSN, [place] Coordinator Office number [phone] Mobile number [phone]  Main [facility] number [phone] Fax number [phone]

## 2019-06-09 ENCOUNTER — Encounter (HOSPITAL_COMMUNITY): Payer: Self-pay

## 2019-06-09 ENCOUNTER — Other Ambulatory Visit: Payer: Self-pay | Admitting: *Deleted

## 2019-06-09 ENCOUNTER — Other Ambulatory Visit: Payer: Self-pay

## 2019-06-09 ENCOUNTER — Emergency Department (HOSPITAL_COMMUNITY)
Admission: EM | Admit: 2019-06-09 | Discharge: 2019-06-09 | Disposition: A | Discharge status: Home / Self Care | Payer: Medicare Other | Attending: Emergency Medicine | Admitting: Emergency Medicine

## 2019-06-09 DIAGNOSIS — S299XXA Unspecified injury of thorax, initial encounter: Secondary | ICD-10-CM | POA: Diagnosis present

## 2019-06-09 DIAGNOSIS — I10 Essential (primary) hypertension: Secondary | ICD-10-CM | POA: Insufficient documentation

## 2019-06-09 DIAGNOSIS — Z853 Personal history of malignant neoplasm of breast: Secondary | ICD-10-CM | POA: Diagnosis not present

## 2019-06-09 DIAGNOSIS — Z87891 Personal history of nicotine dependence: Secondary | ICD-10-CM | POA: Diagnosis not present

## 2019-06-09 DIAGNOSIS — Y929 Unspecified place or not applicable: Secondary | ICD-10-CM | POA: Diagnosis not present

## 2019-06-09 DIAGNOSIS — Z7901 Long term (current) use of anticoagulants: Secondary | ICD-10-CM | POA: Diagnosis not present

## 2019-06-09 DIAGNOSIS — S22070A Wedge compression fracture of T9-T10 vertebra, initial encounter for closed fracture: Secondary | ICD-10-CM | POA: Diagnosis not present

## 2019-06-09 DIAGNOSIS — Y999 Unspecified external cause status: Secondary | ICD-10-CM | POA: Diagnosis not present

## 2019-06-09 DIAGNOSIS — S22000A Wedge compression fracture of unspecified thoracic vertebra, initial encounter for closed fracture: Secondary | ICD-10-CM | POA: Insufficient documentation

## 2019-06-09 DIAGNOSIS — Z79899 Other long term (current) drug therapy: Secondary | ICD-10-CM | POA: Insufficient documentation

## 2019-06-09 DIAGNOSIS — S22080A Wedge compression fracture of T11-T12 vertebra, initial encounter for closed fracture: Secondary | ICD-10-CM | POA: Diagnosis not present

## 2019-06-09 DIAGNOSIS — Y939 Activity, unspecified: Secondary | ICD-10-CM | POA: Insufficient documentation

## 2019-06-09 DIAGNOSIS — X58XXXA Exposure to other specified factors, initial encounter: Secondary | ICD-10-CM | POA: Diagnosis not present

## 2019-06-09 HISTORY — DX: Unspecified fracture of t9-t10 vertebra, initial encounter for closed fracture: S22.079A

## 2019-06-09 MED ORDER — LIDOCAINE 5 % EX PTCH
1.0000 | MEDICATED_PATCH | CUTANEOUS | Status: DC
  Administered 2019-06-09: 1 via TRANSDERMAL
  Filled 2019-06-09: qty 1

## 2019-06-09 MED ORDER — OXYCODONE HCL 5 MG PO TABS
5.0000 mg | ORAL_TABLET | Freq: Once | ORAL | Status: AC
  Administered 2019-06-09: 5 mg via ORAL
  Filled 2019-06-09: qty 1

## 2019-06-09 MED ORDER — LIDOCAINE 4 % EX PTCH: 1.0000 | MEDICATED_PATCH | Freq: Two times a day (BID) | CUTANEOUS | 0 refills | Status: DC

## 2019-06-09 NOTE — Patient Outreach (Signed)
[facility]) Care Management  06/09/2019  [place] [birth year] [id number]   [facility] outreach to patient after receiving a voice message  Mrs Dicola is active with [facility] after 04/20/19 [facility] UM referral/cost concern with Eliquis and 06/08/19 Nurse call center referral In the [facility] UM referral outreach to the patient in February 2021 she reports resolved issues with Eliquis after assist from [place] staff, resolved UTI, denied any other needs and agreed to [facility] follow up quarterly  Nurse call center notes Patient called in. States she went to ED last night with back pain, XR showed a fractured vertebrae in her back which confirms a CT scan she had done prior to this visit. ED told her to do a F/U visit to her PCP.  ED RX given was cyclobenzaprine 5 mg which she says is not helping her pain. Pain is 10/10. Has been dealing with this pain/fracture since January 19 th. Has tried heat and cold and neither works, she says the pain is deep in her back. Denies numbness or weakness in her lower extremities. Suggested she call her PCP office but she says when she calls the phone service tells her to call 911, suggested she go back to the ED for pain control but she refuses to go back to the ED. Suggested a Urgent care which she says is covered. She said she will try that option.  Recommended cold or heat locally   No [facility] admissions in the last 6 months  [place] visits x 5 in the last 6 months 06/06/19 [facility] ED back pain severe back pain after twisting, know T-10 compression fracture, CT showing new acute endplate fractures at O84 and 12 in addition to stable T10 closed wedge compression fracture without vertebral height loss, TLSO splint, pain control 06/05/19 [facility] ED back pain after bending, felt something pop, xray= stable T10 compression fracture, pain control 05/23/19 [facility] ED flank pain, CT scan shows thoracic compression fracture, EKG shows Afib  04/13/19 [facility] ED constipation with low  back pain  04/13/19 [facility] urgent care center left mid back pain secondary to constipation    Outreach attempt #2  Mrs Conry left a voice message today and [facility] RN CM returned her call  Mr Maahs answered He was able to verify HIPAA ([place] and Accountability Act) identifiers, date of birth (DOB) and address Reviewed the [place] to him Mr Terry reports Mrs Gilliam is back at [facility] ED since "about noon today cause she is in a lot of pain"  Present concerns per husband Mr Dillow reports Mrs Franzoni initially injured her back not from a fall but on around April 14 2019 while coming up 5 steps into the home carrying her purse and other bags. She dropped a bag and reached to pick it up. He reports she heard a sound and began to have pain. She did see her primary care provider's PA, Marland Kitchen during the week of April 20 2019  It is noted she also was seen at the urgent care and ED for back pain with various diagnoses in January, February 2021 She has not followed back up with her primary care MD since January 2021, an orthopedic provider, neurologist or pain management provider. Mr Ow states "You have to see who is available." "this is out of his line" [facility] RN CM reviewed providers/specialists that assist with compression fractures  Mr Santelli reports Mrs Lutzke has not been on bedrest and was doing well until she  reach down to the foot of her bed to get a blanket around 06/05/19. He reports the increased ED visits have been related to increase pain. He reports they have been informed the fracture "will heal on its own" He confirms she has been wearing the TLSO brace provided recently on 06/06/19, remains active but remains in pain No follow up appointments have been made to see recommended providers per the 06/06/19 after summary visit  He informs [facility] RN CM that he thinks she will be discharged home today and will be given an appointment  to follow up with "doctor pool"   [facility] RN CM discussed standard treatment of compression fracture to include bed rest, braces and pain management. [facility] RN CM referred Mr Taketa to re-read the after summary visit/discharge sheet from 06/06/19.  [facility] RN CM emphasized reading the area about the brace and Activity restriction to allow the compression to heal. [facility] RN CM also encouraged him to speak with the providers about community pain management services and possible referrals  Mr Olds wanted to clear the phone line pending a call to pick up the patient from the ED  [facility] RN CM will follow up with Mrs Pavlovich with in the next 3-7 business days  Pt encouraged to return a call to [facility] RN CM prn Routed note to MD  [facility] CM Care Plan Problem One     Most Recent Value  Care Plan Problem One  Medication cost - Eliquis to manage Atrial fibrillation  Role Documenting the Problem One  Care Management Telephonic Coordinator  Care Plan for Problem One  Active  [facility] Long Term Goal   over the next 30 days patient will report managment of atrial fibrillation without cost concern for medicine during follow up contact  [place] Goal Start Date  06/09/19  Interventions for Problem One Long Term Goal  pending assessment Now with concerns of compression fx  [facility] CM Short Term Goal #1   over the next 14 days patient will have contact from [place] staff for medication assistance, questions and side effects for Eliquis and verapamil as evidence of EMR documentation and patient verbalizing during follow up outreach  [facility] CM Short Term Goal #1 Start Date  04/27/19  [facility] CM Short Term Goal #1 Met Date  06/09/19    [facility] CM Care Plan Problem Two     Most Recent Value  Care Plan Problem Two  knowledge of home care for medical diagnosis to include compression fx, afib  Role Documenting the Problem Two  Care Management Telephonic Coordinator  Care Plan for Problem Two  Active  Interventions for Problem  Two Long Term Goal   assessed compression concerns, cause, home care, discused standard treatment of compression fx to include bedrest, brace, pain management, encourage speakig with MD about pain management services, encouraged at follow up outreach   [facility] Long Term Goal  over the next 90 days patient will verbalize 2 interventions for home care of compression fracture, Afib during outreach calls  [place] Goal Start Date  06/09/19  [name] CM Short Term Goal #1   over the next 21 days patient will verbalized visit with orthopedic, neurologist or pain management provider per EMR note or verbalized by patient during outreach   [name] CM Short Term Goal #1 Start Date  06/09/19  Interventions for Short Term Goal #2   encouraged follow up with recommended providers, discussed pain management, orthopedic providers, neurology providers  Lasonja Lakins L. Lavina Hamman, RN, BSN, [place] Coordinator Office number [phone] Mobile number [phone]  Main [facility] number [phone] Fax number [phone]

## 2019-06-09 NOTE — Discharge Instructions (Signed)
Please take pain medicine as prescribed, note this can make you drowsy shot be taken while driving or operating heavy machinery.  Return to emergency room if you develop any numbness, weakness, bladder or bowel incontinence, other new concerning symptom or recurrent fall.  Please schedule follow-up appointment both with the neurosurgery clinic as well as your primary care doctor.

## 2019-06-09 NOTE — ED Provider Notes (Signed)
Sign out note  79 year old lady presenting to ER with with back pain.  Recent diagnosis T10 compression fracture, T11 and T12 endplate fracture.  Has received TLSO brace, Rx for oxycodone.  Has been recommended to follow-up with primary doctor and neurosurgery but has not done so.  Plan for recheck, discussed with neurosurgery.  Received signout from Dr. Kathrynn Humble  Discussed case with Dr. Marcello Moores with neurosurgery, recommends outpatient follow-up with neurosurgery, provided information for neurosurgery to patient, pain well controlled, ambulatory in department without any difficulty   After the discussed management above, the patient was determined to be safe for discharge.  The patient was in agreement with this plan and all questions regarding their care were answered.  ED return precautions were discussed and the patient will return to the ED with any significant worsening of condition.     Lucrezia Starch, MD 06/09/19 [phone]

## 2019-06-09 NOTE — ED Triage Notes (Signed)
Pt presents to ED for uncontrolled back pain. Pt with T10 compression fracture and endplate fractures at V70 and 12. Pt has TLSO splint for home and states she is not able to get adequate pain control at home. Pt states the MD wanted to admit her the other night for pain control but she insisted on going home.

## 2019-06-09 NOTE — ED Provider Notes (Signed)
[facility] EMERGENCY DEPARTMENT Provider Note   CSN: [id number] Arrival date & time: 06/09/19  1214     History Chief Complaint  Patient presents with  . Back Pain    Bailey Bishop is a 79 y.o. female.  HPI     79 year old comes in a chief complaint of back pain.  Patient has history of A. fib, anxiety and recently diagnosed with vertebral fracture with slight retropulsion.  Reports persistent pain despite taking pain medications at home.  She was also prescribed TLSO brace, that she started using with minimal relief.  Patient unfortunately has not followed up with her PCP or neurosurgeon.  She informs me that she takes the opioids sparingly as she is on blood thinners and she is " not a pill person".  Patient resides at her home with her husband, and at baseline she is active.  Patient does indicate that her pain is moving down her back now.  She denies any associated numbness, tingling, urinary incontinence, retention, fecal incontinence, saddle anesthesia.  Past Medical History:  Diagnosis Date  . Anxiety   . Arthritis   . Asthma   . Atrial fibrillation ([place])    a. s/p unsuccessful DCCV in 01/2017 and intolerant to Amiodarone --> Rate-control strategy pursued since b. failed Tikosyn in 11/2017  . Cancer [facility]) 2001   right breast  . Dyspnea   . GERD (gastroesophageal reflux disease)   . Hypertension   . T10 vertebral fracture [name])     Patient Active Problem List   Diagnosis Date Noted  . Iron deficiency anemia   . Benign neoplasm of transverse colon   . Diverticulosis of large intestine without diverticulitis   . Visit for monitoring Tikosyn therapy 12/02/2017  . Atrial fibrillation ([place]) 03/31/2017  . Persistent atrial fibrillation ([place])   . Anticoagulated 10/03/2016  . COPD (chronic obstructive pulmonary disease) ([place]) 10/03/2016  . Hypokalemia 09/24/2016  . Asthma in adult without complication 84/13/2440  . CKD (chronic kidney disease), stage II 09/23/2016   . Hyperglycemia 09/23/2016  . Anxiety   . GERD (gastroesophageal reflux disease)   . Shortness of breath   . Essential hypertension   . Status asthmaticus 07/07/2014    Past Surgical History:  Procedure Laterality Date  . BIOPSY N/A 04/22/2018   Procedure: BIOPSY;  Surgeon: Aviva Signs, MD;  Location: [facility] ENDO SUITE;  Service: Gastroenterology;  Laterality: N/A;  . CARDIOVERSION N/A 02/18/2017   Procedure: CARDIOVERSION;  Surgeon: Josue Hector, MD;  Location: [facility] ENDOSCOPY;  Service: Cardiovascular;  Laterality: N/A;  . CARDIOVERSION N/A 12/04/2017   Procedure: CARDIOVERSION;  Surgeon: [name], MD;  Location: [place];  Service: Cardiovascular;  Laterality: N/A;  . CATARACT EXTRACTION W/PHACO  07/23/2011   Procedure: CATARACT EXTRACTION PHACO AND INTRAOCULAR LENS PLACEMENT ([place]);  Surgeon: Williams Che, MD;  Location: [facility] ORS;  Service: Ophthalmology;  Laterality: Right;  CDE:9.96  . CATARACT EXTRACTION W/PHACO Left 11/13/2018   Procedure: CATARACT EXTRACTION PHACO AND INTRAOCULAR LENS PLACEMENT (IOC);  Surgeon: Baruch Goldmann, MD;  Location: [facility] ORS;  Service: Ophthalmology;  Laterality: Left;  left, CDE: 7.44  . CHOLECYSTECTOMY N/A 02/14/2015   Procedure: LAPAROSCOPIC CHOLECYSTECTOMY;  Surgeon: Aviva Signs, MD;  Location: [facility] ORS;  Service: General;  Laterality: N/A;  . COLONOSCOPY N/A 04/22/2018   Procedure: COLONOSCOPY;  Surgeon: Aviva Signs, MD;  Location: [facility] ENDO SUITE;  Service: Gastroenterology;  Laterality: N/A;  . ESOPHAGOGASTRODUODENOSCOPY N/A 04/22/2018   Procedure: ESOPHAGOGASTRODUODENOSCOPY (EGD);  Surgeon: Aviva Signs, MD;  Location: [facility] ENDO SUITE;  Service: Gastroenterology;  Laterality: N/A;  . MASTECTOMY     bilateral mastectomy-right breast cancer-left taken by choice  . POLYPECTOMY  04/22/2018   Procedure: POLYPECTOMY;  Surgeon: Aviva Signs, MD;  Location: [facility] ENDO SUITE;  Service: Gastroenterology;;  . SEPTOPLASTY  1995     OB History   No  obstetric history on file.     Family History  Problem Relation Age of Onset  . Cancer Mother   . Aneurysm Father   . Cancer Sister   . Cancer Sister   . Anesthesia problems Neg Hx   . Hypotension Neg Hx   . Malignant hyperthermia Neg Hx   . Pseudochol deficiency Neg Hx   . Colon cancer Neg Hx     Social History   Tobacco Use  . Smoking status: Former Smoker    Packs/day: 1.00    Years: 28.00    Pack years: 28.00    Types: Cigarettes    Quit date: 07/17/1983    Years since quitting: 35.9  . Smokeless tobacco: Never Used  Substance Use Topics  . Alcohol use: No  . Drug use: No    Home Medications Prior to Admission medications   Medication Sig Start Date End Date Taking? Authorizing Provider  acetaminophen (TYLENOL) 650 MG CR tablet Take 650 mg by mouth every 8 (eight) hours as needed for pain.    [provider]  albuterol (VENTOLIN HFA) 108 (90 Base) MCG/ACT inhaler Inhale 1-2 puffs into the lungs every 6 (six) hours as needed for wheezing or shortness of breath.    [provider]  ALPRAZolam Duanne Moron) 0.5 MG tablet Take 0.5 mg by mouth at bedtime as needed for anxiety.  06/18/14   [provider]  amiodarone (PACERONE) 200 MG tablet Take 1 tablet (200 mg total) by mouth daily. 04/30/19   Sherran Needs, NP  apixaban (ELIQUIS) 5 MG TABS tablet Take 1 tablet (5 mg total) by mouth 2 (two) times daily. 03/13/19   Herminio Commons, MD  calcium citrate-vitamin D (CITRACAL+D) 315-200 MG-UNIT tablet Take 1 tablet by mouth 2 (two) times daily.    [provider]  cyclobenzaprine (FLEXERIL) 5 MG tablet Take 1 tablet (5 mg total) by mouth 3 (three) times daily as needed for muscle spasms. 06/05/19   Lucrezia Starch, MD  docusate sodium (COLACE) 100 MG capsule Take 1 capsule (100 mg total) by mouth every 12 (twelve) hours. 06/06/19   Harris, Abigail, PA-C  fluticasone (FLONASE) 50 MCG/ACT nasal spray Place 1 spray into both nostrils as needed.   12/26/18   [provider]  furosemide (LASIX) 40 MG tablet Take 1 tablet (40 mg total) by mouth daily. 02/18/19   Evans Lance, MD  Lidocaine 4 % PTCH Apply 1 patch topically 2 (two) times daily. 06/09/19   Varney Biles, MD  omeprazole (PRILOSEC) 20 MG capsule Take 20 mg by mouth daily as needed (for indigestion/stomach issues.).  02/14/16   [provider]  oxyCODONE (ROXICODONE) 5 MG immediate release tablet Take 0.5-1 tablets (2.5-5 mg total) by mouth every 6 (six) hours as needed for severe pain. 06/06/19   Margarita Mail, PA-C  oxyCODONE-acetaminophen (PERCOCET/ROXICET) 5-325 MG tablet Take 2 tablets by mouth every 4 (four) hours as needed for severe pain. 06/06/19   Harris, Abigail, PA-C  polyethylene glycol (MIRALAX / GLYCOLAX) 17 g packet Take 17 g by mouth daily. 04/13/19   Domenic Moras, PA-C  potassium chloride SA (KLOR-CON)  20 MEQ tablet Take 1 tablet (20 mEq total) by mouth daily. 04/30/19   Sherran Needs, NP  valACYclovir (VALTREX) 1000 MG tablet Take 1,000 mg by mouth 3 (three) times daily. 05/16/19   [provider]    Allergies    Cardizem cd [diltiazem hcl er beads], Amiodarone, Metoprolol tartrate, Sulfa antibiotics, Tomato, Carvedilol, and Chocolate  Review of Systems   Review of Systems  Constitutional: Positive for activity change.  Respiratory: Negative for shortness of breath.   Cardiovascular: Negative for chest pain.  Gastrointestinal: Negative for nausea and vomiting.  Genitourinary: Negative for difficulty urinating.  Musculoskeletal: Positive for back pain.  Neurological: Negative for numbness.  Hematological: Bruises/bleeds easily.  All other systems reviewed and are negative.   Physical Exam Updated Vital Signs BP 136/71 (BP Location: Right Arm)   Pulse (!) 102   Temp 98.7 F (37.1 C) (Oral)   Resp 15   Ht 5\' 4"  (1.626 m)   Wt 61.7 kg   SpO2 98%   BMI 23.34 kg/m   Physical Exam Vitals and nursing note reviewed.    Constitutional:      Appearance: She is well-developed.  HENT:     Head: Normocephalic and atraumatic.  Cardiovascular:     Rate and Rhythm: Normal rate.  Pulmonary:     Effort: Pulmonary effort is normal.  Abdominal:     General: Bowel sounds are normal.  Musculoskeletal:     Cervical back: Normal range of motion and neck supple.     Comments: Pt has tenderness over the mid spine, around the upper lumbar region. No step offs, no erythema. Pt has 2+ patellar reflex bilaterally. Able to discriminate between sharp and dull.    Skin:    General: Skin is warm and dry.  Neurological:     Mental Status: She is alert and oriented to person, place, and time.     ED Results / Procedures / Treatments   Labs (all labs ordered are listed, but only abnormal results are displayed) Labs Reviewed - No data to display  EKG None  Radiology No results found.  Procedures Procedures (including critical care time)  Medications Ordered in ED Medications  oxyCODONE (Oxy IR/ROXICODONE) immediate release tablet 5 mg (5 mg Oral Given 06/09/19 1425)    ED Course  I have reviewed the triage vital signs and the nursing notes.  Pertinent labs & imaging results that were available during my care of the patient were reviewed by me and considered in my medical decision making (see chart for details).    MDM Rules/Calculators/A&P                      79 year old comes in a chief complaint of persistent back pain. I have reviewed her x-ray and CT scan from the previous visits.  She is optimally managed with pain medications that are prescribed at last visit and also TLSO brace has been provided.  She informs me that her pain response to the pain medication, however, it does return and she has been uncomfortable with it.  Unfortunately, patient has not followed up with PCP or specialist.  I will discussed the case with our neurosurgical team to see if patient can be given a close follow-up.  No  further ED imaging is indicated.  She has no symptoms concerning for cord compression at this time.  Final Clinical Impression(s) / ED Diagnoses Final diagnoses:  Compression fracture of body of thoracic vertebra ([place])  Rx / DC Orders ED Discharge Orders         Ordered    Lidocaine 4 % PTCH  2 times daily     06/09/19 1525           Varney Biles, MD 06/10/19 [phone]

## 2019-06-10 ENCOUNTER — Other Ambulatory Visit: Payer: Self-pay | Admitting: *Deleted

## 2019-06-10 ENCOUNTER — Telehealth: Payer: Self-pay | Admitting: Internal Medicine

## 2019-06-10 NOTE — Patient Outreach (Signed)
[facility]) Care Management  06/10/2019  [name] 1941-03-11 [id number]   [facility] return call from nurse call and  [name] care [name]) UM (utilization Management) referred patient  Nurse call center notes Patient called in. States she went to ED last night with back pain, XR showed a fractured vertebrae in her back which confirms a CT scan she had done prior to this visit. ED told her to do a F/U visit to her PCP.  ED RX given was cyclobenzaprine 5 mg which she says is not helping her pain. Pain is 10/10. Has been dealing with this pain/fracture since January 19 th. Has tried heat and cold and neither works, she says the pain is deep in her back. Denies numbness or weakness in her lower extremities. Suggested she call her PCP office but she says when she calls the phone service tells her to call 911, suggested she go back to the ED for pain control but she refuses to go back to the ED. Suggested a Urgent care which she says is covered. She said she will try that option  No cone admissions in the last 6 months  [place] visits x 6 in the last 6 months 06/09/19 back pain.  Recent diagnosis T10 compression fracture, T11 and T12 endplate fracture. 06/06/19 [facility] ED back pain severe back pain after twisting, know T-10 compression fracture, CT showing new acute endplate fractures at N36 and 12 in addition to stable T10 closed wedge compression fracture without vertebral height loss, TLSO splint, pain control 06/05/19 [facility] ED back pain after bending, felt something pop, xray= stable T10 compression fracture, pain control 05/23/19 [facility] ED flank pain, CT scan shows thoracic compression fracture, EKG shows Afib  04/13/19 [facility] ED constipation with low back pain  04/13/19 [facility] urgent care center left mid back pain secondary to constipation   Transition of care services noted to be completed by primary care MD office staff- [name] medical Dr Hilma Favors Transition of Care will be completed by  primary care provider office who will refer to [name] care management if needed. This THN RN CM reviewed some Transition of care questions with [name]  [name] returned a call to [facility] RN CM  She is able to verify HIPAA ([place] Patent and Accountability Act) identifiers, date of birth (DOB) and address Reviewed 06/09/19 ED visit with her  Assist with call to Dr Annette Stable office [name] is calling to request [facility] RN CM assist her with calling Dr Annette Stable for a follow up appointment. Her 06/09/19 ED after summary visit states to call Dr Annette Stable and Dr Marcello Moores Both are neurosurgeons in the same office. On 06/09/19 the ED MDs consulted Dr Marcello Moores. Dr  Christella Noa was listed on her 06/06/19 ED after summary visit. [name] has not attempted to call any of the neurosurgeons. She reports consulting with "a nephew who is an anesthesiologist who said to see doctor Pool"  [facility] RN CM complete a conference call to the neurology/neurosurgeon office with [name], spoke with the nurse who transferred [place] to the scheduler.  A message was left to include [facility] RN CM and [name]'s contact information   Review of home care for thoracic compression fractures [facility] RN CM spoke with [name] and reviewed the 06/06/19 after summary visit as [facility] RN reviewed with her husband on 06/09/19 to discuss the recommended bedrest and brace. THN RN CM read the section on how compression fractures are treated to include medicines  for pain, a cast or brace and physical therapy. THN RN CM read the information on activity to include to stay in bed(on bed rest), returning to your normal activities per a MD instruction and exercise with a Physical therapy (PT) staff if recommended  [name] states she had not read all the after summary visit instructions. She states she had not always been wearing the TLSO, has been active or in her recliner. She states she has been informed by several ED MDs that the  compression fracture has to heal :"on its own"  [facility] RN CM discussed increase activity to include twisting and bending that does not allow the compression fracture to heal.  She states she rode home on 06/09/19 in their car and tolerated it. THN RN CM assessed if she would be able to tolerate driving to [facility] in the car to see Dr Annette Stable when an appointment is available. She informs [name] RN CM she would be able to tolerate it and that she and [name] generally are able to drive without concerns to appointments on [address].    Pain management THN RN CM discussed pain management and that the neurology/neurosurgeon could assist with pain management. She informs [facility] RN CM that she did get the lidocaine patches but she has been preferring and using the oxycodone more. She reports tolerable pain today with use of oxycodone only.  She was encouraged to rest, to wear the TLSO brace, and to avoid bending and twisting if possible.  Primary care provider follow up [facility] RN CM assessed the last follow up with the primary care provider (PCP) office. She states the last visit was in January 2021. THN RN CM discussed the importance of pcp follow up after hospital/ ED visits. THN RN CM discussed the availability of the pcp to assist with other services that the specialist may not be ale to assist with and keeping the pcp aware of changing medical concerns  especially in case orders are needed from pcp. She states she will outreach to pcp office.   Plans  [name] RN CM will follow up with Mrs Tamura within the next 4-7 days Pt encouraged to return a call to [facility] RN CM prn Routed note to MD  [facility] CM Care Plan Problem One     Most Recent Value  Care Plan Problem One  Medication cost - Eliquis to manage Atrial fibrillation  Role Documenting the Problem One  Care Management Telephonic Coordinator  Care Plan for Problem One  Active  [facility] Long Term Goal   over the next 30 days patient will report managment of  atrial fibrillation without cost concern for medicine during follow up contact  [place] Goal Start Date  06/09/19  Interventions for Problem One Long Term Goal  assess for worsening symptoms  THN CM Short Term Goal #1   over the next 14 days patient will have contact from [place] staff for medication assistance, questions and side effects for Eliquis and verapamil as evidence of EMR documentation and patient verbalizing during follow up outreach  [facility] CM Short Term Goal #1 Start Date  04/27/19  [facility] CM Short Term Goal #1 Met Date  06/09/19    [facility] CM Care Plan Problem Two     Most Recent Value  Care Plan Problem Two  knowledge of home care for medical diagnosis to include compression fx, afib  Role Documenting the Problem Two  Care Management Telephonic Coordinator  Care Plan for  Problem Two  Active  Interventions for Problem Two Long Term Goal   assessed for worsening symptoms , call with pt to Dr Annette Stable for ED follow up appointment, reviewed d/c instructions Discussed importance of following recommended ED treatment & followup encouraged to rest, to wear the TLSO brace, and to avoid bending and twisting if possible.Assessed pain  THN Long Term Goal  over the next 90 days patient will verbalize 2 interventions for home care of compression fracture, Afib during outreach calls  [place] Term Goal Start Date  06/09/19  [facility] CM Short Term Goal #1   over the next 21 days patient will verbalized visit with orthopedic, neurologist or pain management provider per EMR note or verbalized by patient during outreach   [facility] CM Short Term Goal #1 Start Date  06/09/19  Interventions for Short Term Goal #2   assisted with call to Dr Annette Stable office for appointment, reviewed home care Discussed pain management, encouraged her to f/uwith pcp       Ananth Fiallos L. Lavina Hamman, RN, BSN, [place] Coordinator Office number [phone] Mobile number [phone]  Main THN number  [phone] Fax number [phone]

## 2019-06-10 NOTE — Telephone Encounter (Signed)
New message   Bailey Bishop is calling on behalf of the patient. She states that the pt wants to set up an appt with Dr. Rayann Heman. Pt is a pt of [name] and would like to change providers. It this ok? I see it was asked in February, I did not see a response.

## 2019-06-11 ENCOUNTER — Ambulatory Visit: Payer: Medicare Other | Admitting: Student

## 2019-06-11 ENCOUNTER — Other Ambulatory Visit: Payer: Self-pay | Admitting: *Deleted

## 2019-06-11 NOTE — Telephone Encounter (Signed)
OK with [name] to change providers.

## 2019-06-12 DIAGNOSIS — S24103D Unspecified injury at T7-T10 level of thoracic spinal cord, subsequent encounter: Secondary | ICD-10-CM | POA: Diagnosis not present

## 2019-06-12 DIAGNOSIS — M545 Low back pain: Secondary | ICD-10-CM | POA: Diagnosis not present

## 2019-06-12 DIAGNOSIS — S22079G Unspecified fracture of T9-T10 vertebra, subsequent encounter for fracture with delayed healing: Secondary | ICD-10-CM | POA: Diagnosis not present

## 2019-06-12 NOTE — Patient Outreach (Signed)
[facility]) Care Management  06/12/2019  Runnells 04/06/1940 [id number]   Late entry for 06/11/19 0906   [facility] outreach to nurse call and  [place] care [facility]) UM (utilization Management) referred patient  Insurance united healthcare medicare    No cone admissions in the last 6 months  [place] visits x 6 in the last 6 months 06/09/19 back pain. Recent diagnosis T 10 compression fracture, T 11 and T 12 endplate fracture. 06/06/19 [facility] ED back pain severe back pain after twisting, know T-10 compression fracture, CT showingnew acute endplate fractures at T 11 and 12 in addition to stable T 10closed wedgecompression fracture without vertebral height loss, TLSO splint, pain control 06/05/19 [facility] ED back pain after bending, felt something pop, xray= stable T 10 compression fracture, pain control 05/23/19 [facility] ED flank pain, CT scan shows thoracic compression fracture, EKG shows Afib  04/13/19 [facility] ED constipation with low back pain  04/13/19 [facility] urgent care center left mid back pain secondary to constipation  Transition of care services noted to be completed by primary care MD office staff- [facility] medical Dr Hilma Favors Transition of Care will be completed by primary care provider office who will refer to [name] care management if needed. This THN RN CM reviewed some Transition of care questions with Mrs Furukawa  Successful outreach to Mrs Porto She is able to verify HIPAA ([place] and Accountability Act) identifiers, date of birth (DOB) and address   Follow up Neurology  Mrs Bisher confirms she did receive a return call from [name] from the neurology office She reports Rollene Fare sent forms to her via e mail the have been completed for establishing new patient neurology care with Dr Marcello Moores She has an appointment for 06/12/19 at 54  Mrs Staiger confirms she read the after summary/discharge instructions on 06/10/19 Mrs Galeno reports  she has been less active today and her pain is very manageable at pain level 2 She reports taking her pain medication, wearing her TLSO and resting. Mr Wan is assisting her in the home [facility] RN CM discussed home health [facility]) Physical therapy (PT) services that may assist with positioning to reduce pain and encourage her to speak with Dr Marcello Moores or [place] RN CM assessed for last bone density testing and calcium intake Mrs Napp reports no recent bone density testing She shares she was receiving bone density testing after her breast cancer (diagnosed "twenty years ago") with bilateral mastectomy. She reports having calcium with D3. She states she has thought about use of Fosamax. She was encouraged to speak with her primary care provider (pcp). She reports she did call her pcp office but Suezanne Jacquet was not in.  Mrs Criss was also encouraged to monitor constipation (as she has a history (hx) of constipation) with the use of oxycodone. She states she has not had a stool in 3 days but has taken "three colace"  THN RN CM encouraged prn laxatives, increased fluids, fruits and vegetable intake.  She has previously sent EMMI materials provided to her after her 04/13/19 ED visit

## 2019-06-12 NOTE — Telephone Encounter (Signed)
Responded via staff msg.

## 2019-06-16 ENCOUNTER — Other Ambulatory Visit: Payer: Self-pay | Admitting: *Deleted

## 2019-06-16 ENCOUNTER — Telehealth: Payer: Self-pay | Admitting: *Deleted

## 2019-06-16 ENCOUNTER — Other Ambulatory Visit: Payer: Self-pay

## 2019-06-16 NOTE — Telephone Encounter (Signed)
Pt notified and voiced understanding 

## 2019-06-16 NOTE — Telephone Encounter (Signed)
-----   Message from Thompson Grayer, MD sent at 06/13/2019  9:27 PM EDT ----- Regarding: RE: Change Providers It looks to me like Dr Lovena Le is doing a fine job with her management.  I would advise that she get in to see him to discuss next steps if she is not feeling well. I dont think that I would have anything to add at this time.  ----- Message ----- From: Levonne Hubert, LPN Sent: 4/81/8563  10:41 AM EDT To: Thompson Grayer, MD Subject: FW: Change Providers                            ----- Message ----- From: Levonne Hubert, LPN Sent: 03/29/9700   1:51 PM EDT To: Thompson Grayer, MD, Evans Lance, MD Subject: Change Providers                               Pt would like to change EP providers from Dr.Taylor to Dr. Rayann Heman. Pt states she does not like her current care. Please advise.

## 2019-06-16 NOTE — Patient Outreach (Addendum)
[facility] [facility]) Care Management  06/16/2019  [place]oreham [birth year] [id number]   [facility] outreach to nurse call and[employer] [facility] ([employer])UM (utilization Management)referred patient  Insurance united healthcare medicare    No [facility] admissions in the last 6 months  [place] visits x6in the last 6 months 3/16/21back pain. Recent diagnosis T 10 compression fracture, T 11 and T 12 endplate fracture. 06/06/19 [facility] back pain severe back pain after twisting, know T-10 compression fracture, CT showingnew acute endplate fractures at T 11 and 12 in addition to stable T 10closed wedgecompression fracture without vertebral height loss, TLSO splint, pain control 06/05/19 [facility] ED back pain after bending, felt something pop, xray= stable T 10 compression fracture, pain control 05/23/19 [facility] ED flank pain, CT scan shows thoracic compression fracture, EKG shows Afib  04/13/19 [facility] ED constipation with low back pain  04/13/19 [facility] urgent care center left mid back pain secondary to constipation  Transition of care services noted to be completed by primary care MD office staff- [facility] medical Dr Hilma Favors Transition of Care will be completed by primary care provider office who will refer to [name] care management if needed. This THN RN CM reviewed some Transition of care questions with Mrs Wahab  Successful outreach to Mrs Rothbauer She isable to verify HIPAA ([place] and Accountability Act) identifiers, date of birth (DOB) and address [facility] RN CM discussed the purpose of the call is to follow up on her neurology appointment Mrs Mcmahan has not been to the ED or hospitalized since 06/09/19 ED visit   Follow up Neurology appointment on 06/12/19  Mrs Cavagnaro states she did attend the neurology appointment but reports she "not going back". She reports sitting in a Wheelchair (W/C) in the office "in pain". She reports she was informed she had a mild fracture, a  MRI was ordered "nothing else" and she states she thought the MRI was to be done at [facility] on 06/15/19 but she had not heard anything from Dr Marcello Moores office or [facility] staff. She reports she thought the MD would have sent her "to [place] for a MRI" Mr Propst stated in the background "They don't do MRIs there" referring to [facility]. Mrs Strohm was then noted to agree but also state she was claustrophobic and would not be able to tolerate a regular MRI. THN RN CM discussed open MRIs with her and that it generally takes some time to get an open MRI scheduled usually.  THN RN CM is does not have access to 06/12/19 Dr Marcello Moores notes (pt informed) She was not able to inform [facility] RN CM if Dr Marcello Moores was aware or was ordering an open MRI for her. [facility] RN CM discussed the importance of completing a MRI when she reported she was not going to complete the MRI but would made an appointment to see Edythe Clarity, PA/NP at [place] (her stated preference of [place] provider) on 06/15/19 around 1 pm. She reports "he will evaluate me and tell me what to do." [name] RN CM inquired if Mrs Felmlee spoke with Dr Marcello Moores about pain management or home health [name]) Physical therapy (PT). She confirms she did not speak with Dr Marcello Moores about any of these "he could see I was in pain"  Pain status  She report she continues to remain in her recliner with her feet elevated or bed, takes her pain medication and has had a stool. She continues use of stool softeners to assist with constipation  prevention (has a history (hx) of constipation), THN RN CM discussed how constipation also causes lower abdominal and back pain  She describes her pain as lower back pain that radiates to her right side hip more when she stands. "near kills me" , "burning" "pressure"  Discussion of nerve pain with radiation, " Pressing on a nerve" She voices she is aware that the ED providers did confirm with her that it takes "six to eight weeks for it to heal" [facility]  RN CM reviewed with her that her first ED visit for her back was listed on 05/23/19 but she did not begin following all the recommended activity recommendations for a compression fracture until after 06/09/19 [facility] RN CM discussed with Mrs Brunkhorst the standard care for how compression fractures are treated to include rest/bedrest, medicines for pain, a cast or brace and physical therapy. THN RN CM reviewed information on activity to include to stay in bed(on bed rest), returning to your normal activities per a MD instruction and exercise with a Physical therapy (PT) staff if recommended  Atrial fibrillation No worsening symptoms reported  Outreach to Dr Marcello Moores, Neurosurgery related to [place] assistant for Dr Gwenyth Allegra [phone]), left a message for a return call to [place] related to the MRI for Mrs Chaney   Outreach to [place] [facility] RN CM spoke with Lauro Regulus at [facility] [phone]) who confirms no orders for a MRI for Mrs Wetherell and that [facility] does not perform open MRIs and it usually has to be done in [place] appointments 06/18/19 Rowan Blase, NP/PA [place] medical- primary care provider (PCP) 07/02/19 [name], [place] [facility] 10/14/19 cardiology [place] ? Coumadin clinic  Plans  [facility] RN CM will follow up with Mrs Olliff within the next 7-10 days after pcp appointment and verification of MRI Pt encouraged to return a call to [facility] RN CM prn Routed note to MD  [facility] CM Care Plan Problem One     Most Recent Value  Care Plan Problem One  Medication cost - Eliquis to manage Atrial fibrillation  Role Documenting the Problem One  Care Management Telephonic Coordinator  Care Plan for Problem One  Active  [facility] Long Term Goal   over the next 30 days patient will report managment of atrial fibrillation without cost concern for medicine during follow up contact  [place] Goal Start Date  06/09/19  Interventions for Problem One  Long Term Goal  assessed for worsening symptoms  THN CM Short Term Goal #1   over the next 14 days patient will have contact from [place] staff for medication assistance, questions and side effects for Eliquis and verapamil as evidence of EMR documentation and patient verbalizing during follow up outreach  [facility] CM Short Term Goal #1 Start Date  04/27/19  [facility] CM Short Term Goal #1 Met Date  06/09/19    [facility] CM Care Plan Problem Two     Most Recent Value  Care Plan Problem Two  knowledge of home care for medical diagnosis to include compression fx, afib  Role Documenting the Problem Two  Care Management Telephonic Coordinator  Care Plan for Problem Two  Active  Interventions for Problem Two Long Term Goal   assessed for home care of afib and compression fx, review care for compression fx, assessed result of neurology appt, educated on open MRI, assessed pain, encouraged follow up with pcp,   [facility] Long Term  Goal  over the next 90 days patient will verbalize 2 interventions for home care of compression fracture, Afib during outreach calls  [place] Term Goal Start Date  06/09/19  [name] CM Short Term Goal #1   over the next 21 days patient will verbalized visit with orthopedic, neurologist or pain management provider per EMR note or verbalized by patient during outreach   [facility] CM Short Term Goal #1 Start Date  06/09/19  [facility] CM Short Term Goal #1 Met Date   06/16/19  THN CM Short Term Goal #2   over the next 30 days the patient will receive an open MRI or verbalized she prefers not to have an open MRI during outreach in the future  [facility] CM Short Term Goal #2 Start Date  06/16/19  Interventions for Short Term Goal #2  Discussed MRIs, Discussed process to schedule open MRI, referred her back to neurology and to pcp to discuss, encouraged follow recommendations for MRI as part of treatment plan, left message for Dr Marcello Moores MA, spoke with [facility] staff      Daniels. Lavina Hamman, RN, BSN, [place] Coordinator Office number [phone] Mobile number [phone]  Main THN number [phone] Fax number [phone]

## 2019-06-17 ENCOUNTER — Other Ambulatory Visit: Payer: Self-pay | Admitting: *Deleted

## 2019-06-17 NOTE — Patient Outreach (Signed)
[place] [facility]) Care Management  06/17/2019  Danise Dehne [place]win [birth year] [id number]   [facility] Care coordination-outreach from Neurosurgery  [facility] RN CM received a return call from [place], Dr Marcello Moores medical assistant, MA She and [name] RN CM reviewed notes from [name] and the 06/12/19 office visit with Dr Marcello Moores in detail.  [place] confirms Dr Marcello Moores ordered for MRIs of  Mrs Ayllon's thoracic and lumbar areas to assess further, ordered valium to assist with the regular MRI, order to get her fitted for a better fitting TLSO, order for bone density and PT for mobility. It is noted she did mention to Dr Marcello Moores that she was claustrophobic which is why the valium was offered.  The status of the MRI orders is pending insurance authorization before scheduling  [name] RN CM discussed the follow up contact with Mrs Hardrick on 06/16/19  [facility] RN CM discussed Mrs Muchmore voicing concern about not received the treatment intervention she preferred on 06/12/19 from neurosurgery staff and discussing with [facility] RN CM that she will not be returning to Dr Marcello Moores office further services but will be seeing her primary care NP/PA on 06/18/19 -[place] RN CM discussed an outreach will be made to Mrs Mells to discuss and encourage continuation of ordered treatments.  Plan [facility] RN CM will follow up with Mrs Holquin within the next 7-10 days after pcp appointment and verification of MRI Routed note to MD  Joelene Millin L. Lavina Hamman, RN, BSN, [place] Coordinator Office number [phone] Mobile number [phone]  Main THN number [phone] Fax number [phone]

## 2019-06-18 ENCOUNTER — Other Ambulatory Visit (HOSPITAL_COMMUNITY): Payer: Self-pay | Admitting: Neurosurgery

## 2019-06-18 ENCOUNTER — Other Ambulatory Visit: Payer: Self-pay | Admitting: Neurosurgery

## 2019-06-18 ENCOUNTER — Other Ambulatory Visit: Payer: Self-pay | Admitting: *Deleted

## 2019-06-18 DIAGNOSIS — Z0001 Encounter for general adult medical examination with abnormal findings: Secondary | ICD-10-CM | POA: Diagnosis not present

## 2019-06-18 DIAGNOSIS — S24103D Unspecified injury at T7-T10 level of thoracic spinal cord, subsequent encounter: Secondary | ICD-10-CM

## 2019-06-18 DIAGNOSIS — M545 Low back pain, unspecified: Secondary | ICD-10-CM

## 2019-06-18 DIAGNOSIS — Z1389 Encounter for screening for other disorder: Secondary | ICD-10-CM | POA: Diagnosis not present

## 2019-06-18 DIAGNOSIS — S22079G Unspecified fracture of T9-T10 vertebra, subsequent encounter for fracture with delayed healing: Secondary | ICD-10-CM

## 2019-06-18 NOTE — Patient Outreach (Signed)
[place] [facility]) Care Management  06/18/2019  [place] 1940/10/20 [id number]   [place] coordination- Neurosurgery collaboration   [facility] RN CM returned a call to [facility], medical assistant, neurosurgery (Dr Marcello Moores) After a message left on 06/17/19   No answer at [phone] ext [phone] . [facility] RN CM left HIPAA [facility] Portability and Accountability Act) compliant voicemail message along with CM's contact info.     Plan [facility] RN CM will follow up with the patient in the next 7-14 days  Sharona Rovner L. Lavina Hamman, RN, BSN, [place] Coordinator Office number [phone] Mobile number [phone]  Main [facility]N number [phone] Fax number [phone]

## 2019-06-18 NOTE — Patient Outreach (Signed)
[place] [facility]) Care Management  06/18/2019  [place] 1940-05-22 [id number]   [place] coordination- collaboration with neurosurgery staff   Return call from Neurosurgery Medical assistant (MA), Chiwanda Rice of Dr Shona Needles spoke with Bailey & Bailey Bishop who informed her Bailey Bishop prefers not to receive further services from Dr Marcello Moores, Neurosurgery office.  The authorization for the MRI had been received to the neurosurgery office  Services will be discontinued   Plan [facility] RN CM will follow up with Bailey Durrett within the next7-10days    [place]. Bailey Hamman, RN, BSN, [place] Coordinator Office number [phone] Mobile number [phone]  Main THN number [phone] Fax number [phone]

## 2019-06-19 ENCOUNTER — Other Ambulatory Visit: Payer: Self-pay | Admitting: *Deleted

## 2019-06-19 NOTE — Patient Outreach (Signed)
Bailey [name] Care Management  06/19/2019  Bailey Bishop 23-Jun-1940 [id number]   Late entry for 06/18/19 1725  THNoutreach tonurse call andUnited Health care (UHC)UM (utilization Management)referred patient  Insuranceunited healthcare medicare   No [facility] admissions in the last 6 months  [place] visits x6in the last 6 months 3/16/21back pain. Recent diagnosisT 10compression fracture, T 11and T 12endplate fracture. 06/06/19 [facility] back pain severe back pain after twisting, know T-10 compression fracture, CT showingnew acute endplate fractures atT 24MGN 12 in addition to stable T 10closed wedgecompression fracture without vertebral height loss, TLSO splint, pain control 06/05/19 [facility] back pain after bending, felt something pop, xray= stableT 10compression fracture, pain control 05/23/19 [facility] flank pain, CT scan shows thoracic compression fracture, EKG shows Afib  04/13/19 [facility] constipation with low back pain  04/13/19 [facility] urgent care [facility] left mid back pain secondary to constipation  Transition of care services noted to be completed by primary care MD office staff- [facility] medical Dr Hilma Favors Transition of Care will be completed by primary care provider office who will refer to [facility] care management if needed. This THN RN CM reviewed some Transition of care questions with Bailey Bishop  Successful outreach toMrs Bishop She isable to verify HIPAA ([place] and Accountability Act) identifiers, date of birth (DOB) and address [name] RN CM discussed the purpose of the call is to follow up on her neurology appointment Bailey Bishop has not been to the ED or hospitalized since 06/09/19 ED visit  Return outreach to patient after she left a message today requesting a return call  Bailey Bishop informs [facility] RN CM she was seen by Bailey Bishop on 06/16/19 morning  She reports Bailey Bishop wants her to continue to see Dr Marcello Moores.  She reports  she is scheduled for 2 MRIs on 07/22/19 at [place]  Bailey Bishop inquires if she should return to Dr Marcello Moores after 07/22/19  Bailey Bishop states she believes she may be "healed by then"  Bailey Bishop informs [name] RN CM she was healed for the injury in January 2021 until she was re injured in early March 2021 while in her bed "Today I did not know if I was coming or going"  [facility] RN CM allowed Bailey Bishop to ventilate   [facility] RN CM informed Bailey Bishop that [facility] RN CM would not be able to inform her if she should return to see Dr Marcello Moores after 07/22/19 or if she would be "healed"  [facility] RN CM again reminded Bailey Bishop of the time that is needed to get MRIs authorized and scheduled [facility] RN CM again recommends Bailey Bishop follow the recommendations of her doctors to complete the ordered MRIs to help determine this, wear her TLSO, remain as immobile as possible, receive physical therapy if offered and to follow up with each provider as requested.   THN RN CM inquired if Bailey Bishop spoke with staff at Dr Marcello Moores office and she confirms she has spoken with staff at Dr Marcello Moores office Bailey Bishop reports her is doing well today She has not expressed being in pain    Plans  [facility] RN CM will follow up with Bailey Bishop within the next 14-21 business days after pcp appointment and verification of MRI Pt encouraged to return a call to [facility] RN CM prn Routed note to MD   [place]. Bailey Hamman, RN, BSN, [place] Coordinator Office number [phone] Mobile number [phone])  [place] Bishop number [phone] Fax number [phone]

## 2019-06-22 ENCOUNTER — Other Ambulatory Visit: Payer: Self-pay | Admitting: *Deleted

## 2019-06-22 NOTE — Patient Outreach (Signed)
  [facility]) Care Management  06/22/2019  [place] 1940-08-29 [id number]    THNoutreach tonurse call andUnited Health care (UHC)UM (utilization Management)referred patient  Insuranceunited healthcare medicare   No [facility] admissions in the last 6 months  [place] visits x6in the last 6 months 3/16/21back pain. Recent diagnosisT 10compression fracture, T 11and T 12endplate fracture. 06/06/19 [facility] back pain severe back pain after twisting, know T-10 compression fracture, CT showingnew acute endplate fractures atT 75FFM 12 in addition to stable T 10closed wedgecompression fracture without vertebral height loss, TLSO splint, pain control 06/05/19 [facility] back pain after bending, felt something pop, xray= stableT 10compression fracture, pain control 05/23/19 [facility] ED flank pain, CT scan shows thoracic compression fracture, EKG shows Afib  04/13/19 [facility] ED constipation with low back pain  04/13/19 [facility] urgent care center left mid back pain secondary to constipation  Transition of care services noted to be completed by primary care MD office staff- [name] medical Dr Hilma Favors Transition of Care will be completed by primary care provider office who will refer to [facility] care management if needed.   Outreach attempt unsuccessful  No answer. THN RN CM left HIPAA [name] Portability and Accountability Act) compliant voicemail message along with CM's contact info.   Mrs Tackett has not been to the ED or hospitalized since 06/09/19 ED visit  Plans  [facility] RN CM will follow up with Mrs [place] within the [place]. Lavina Hamman, RN, BSN, [place] Coordinator Office number [phone] Mobile number [phone]  Main THN number [phone] Fax number [phone]

## 2019-06-26 ENCOUNTER — Other Ambulatory Visit (HOSPITAL_COMMUNITY): Payer: Self-pay | Admitting: *Deleted

## 2019-06-26 MED ORDER — AMIODARONE HCL 200 MG PO TABS: 200.0000 mg | ORAL_TABLET | Freq: Every day | ORAL | 2 refills | Status: DC

## 2019-07-02 ENCOUNTER — Ambulatory Visit: Payer: Medicare Other | Admitting: Student

## 2019-07-06 ENCOUNTER — Other Ambulatory Visit: Payer: Self-pay | Admitting: *Deleted

## 2019-07-06 NOTE — Patient Outreach (Addendum)
[place] [facility]) Care Management  07/06/2019  [place] March 11, 1941 [id number]   THNoutreach tonurse call andUnited Health care (UHC)UM (utilization Management)referred patient  Insuranceunited healthcare medicare   No [facility] admissions in the last 6 months   visits x6in the last 6 months 3/16/21back pain. Recent diagnosisT 10compression fracture, T 11and T 12endplate fracture. 06/06/19 [facility] back pain severe back pain after twisting, know T-10 compression fracture, CT showingnew acute endplate fractures atT 35KKX 12 in addition to stable T 10closed wedgecompression fracture without vertebral height loss, TLSO splint, pain control 06/05/19 [facility] ED back pain after bending, felt something pop, xray= stableT 10compression fracture, pain control 05/23/19 [facility] ED flank pain, CT scan shows thoracic compression fracture, EKG shows Afib  04/13/19 [facility] ED constipation with low back pain  04/13/19 [facility] urgent care center left mid back pain secondary to constipation  Transition of care services noted to be completed by primary care MD office staff- [facility] medical Dr Hilma Favors Transition of Care will be completed by primary care provider office who will refer to [facility] care management if needed.   Second Outreach attempt unsuccessful (to the home number) No answer. THN RN CM left HIPAA [facility] Portability and Accountability Act) compliant voicemail message along with CM's contact info.   Mrs Hund has not been to the ED or hospitalized since 06/09/19 ED visit  Plans  [facility] RN CM sent an unsuccessful letter to this [facility] engaged patient and will follow up with Mrs [place] within the [place]. Lavina Hamman, RN, BSN, [place] Coordinator Office number [phone] Mobile number [phone]  Main THN number [phone] Fax number [phone]

## 2019-07-10 DIAGNOSIS — M545 Low back pain: Secondary | ICD-10-CM | POA: Diagnosis not present

## 2019-07-13 DIAGNOSIS — H10022 Other mucopurulent conjunctivitis, left eye: Secondary | ICD-10-CM | POA: Diagnosis not present

## 2019-07-13 DIAGNOSIS — Z0000 Encounter for general adult medical examination without abnormal findings: Secondary | ICD-10-CM | POA: Diagnosis not present

## 2019-07-13 DIAGNOSIS — Z13220 Encounter for screening for lipoid disorders: Secondary | ICD-10-CM | POA: Diagnosis not present

## 2019-07-14 ENCOUNTER — Encounter: Payer: Self-pay | Admitting: Student

## 2019-07-14 ENCOUNTER — Ambulatory Visit (INDEPENDENT_AMBULATORY_CARE_PROVIDER_SITE_OTHER): Payer: Medicare Other | Admitting: Student

## 2019-07-14 ENCOUNTER — Other Ambulatory Visit: Payer: Self-pay

## 2019-07-14 VITALS — BP 144/80 | HR 106 | Temp 98.9°F | Ht 64.0 in | Wt 137.0 lb

## 2019-07-14 DIAGNOSIS — I5032 Chronic diastolic (congestive) heart failure: Secondary | ICD-10-CM | POA: Diagnosis not present

## 2019-07-14 DIAGNOSIS — I4821 Permanent atrial fibrillation: Secondary | ICD-10-CM

## 2019-07-14 DIAGNOSIS — I340 Nonrheumatic mitral (valve) insufficiency: Secondary | ICD-10-CM | POA: Diagnosis not present

## 2019-07-14 DIAGNOSIS — N183 Chronic kidney disease, stage 3 unspecified: Secondary | ICD-10-CM | POA: Diagnosis not present

## 2019-07-14 NOTE — Progress Notes (Signed)
Cardiology Office Note    Date:  07/14/2019   ID:  Bailey Bishop, DOB [birth year], MRN [id number]  PCP:  [name], MD  Cardiologist: Kate Sable, MD   EP: [name]  Chief Complaint  Patient presents with  . Follow-up    6 month visit    History of Present Illness:    Bailey Bishop is a 79 y.o. female with past medical history of persistent atrial fibrillation (s/p unsuccessful DCCV in 01/2017 and intolerant to Amiodarone in the past, started on Tikosyn in 11/2017 and failed therapy, intolerant to Lopressor, Toprol-XL, Coreg, Cardizem, and Cardizem CD in the past), HTN, chronic diastolic CHF, and Stage 3 CKD who presents to the office today for follow-up.   She was last examined by [name] in 01/2019 and reported intermittent palpitations but denied any progressive symptoms. Given hypotension had limited further titration of AV nodal blocking agents, options for PPM placement and AV node ablation were reviewed but were not pursued at that time as she was not overly symptomatic.   She called the office in 04/2019 reporting frequent UTI's and asthma which she thought was secondary to her Verapamil. She wished to retry Amiodarone and by review of phone notes, [name] had her stop Verapamil and restart Amiodarone 200mg  daily on 04/30/2019 with plans for a repeat EKG in 1 week. This showed rate-controlled atrial fibrillation and she was continued on her current regimen.   By review of notes, she has been evaluated in the ED multiple times over the past few months due to worsening back pain and a known T-10 compression fracture. Repeat imaging on 3/13 showed acute to subacute compression fractures of the superior endplates T11 and W09 with outpatient Neurosurgery follow-up recommended.   In talking with the patient today, she reports her back pain has improved with conservative measures and she is not planning to undergo any type of surgical intervention at this time.  She has overall done well from a cardiac perspective and reports feeling "great" on Amiodarone. She was previously having significant constipation with Verapamil and symptoms resolved upon discontinuation of this.  She does experience some palpitations but report heart rate has typically been in the 60's to 80's when checked at home. She denies any recent dyspnea on exertion, chest pain, orthopnea, PND or lower extremity edema.   Past Medical History:  Diagnosis Date  . Anxiety   . Arthritis   . Asthma   . Atrial fibrillation ([place])    a. s/p unsuccessful DCCV in 01/2017 and intolerant to Amiodarone --> Rate-control strategy pursued since b. failed Tikosyn in 11/2017  . Cancer [facility]) 2001   right breast  . Dyspnea   . GERD (gastroesophageal reflux disease)   . Hypertension   . T10 vertebral fracture [facility])     Past Surgical History:  Procedure Laterality Date  . BIOPSY N/A 04/22/2018   Procedure: BIOPSY;  Surgeon: Aviva Signs, MD;  Location[facility]O [facility];  Service: Gastroenterology;  Laterality: N/A;  . CARDIOVERSION N/A 02/18/2017   Procedure: CARDIOVERSION;  Surgeon: Josue Hector, MD;  Location: [facility] ENDOSCOPY;  Service: Cardiovascular;  Laterality: N/A;  . CARDIOVERSION N/A 12/04/2017   Procedure: CARDIOVERSION;  Surgeon: [name], MD;  Location: [place];  Service: Cardiovascular;  Laterality: N/A;  . CATARACT EXTRACTION W/PHACO  07/23/2011   Procedure: CATARACT EXTRACTION PHACO AND INTRAOCULAR LENS PLACEMENT ([place]);  Surgeon: Williams Che, MD;  Location: [facility] ORS;  Service: Ophthalmology;  Laterality: Right;  CDE:9.96  . CATARACT EXTRACTION W/PHACO Left 11/13/2018   Procedure: CATARACT EXTRACTION PHACO AND INTRAOCULAR LENS PLACEMENT ([facility]);  Surgeon: Baruch Goldmann, MD;  Location: [facility] ORS;  Service: Ophthalmology;  Laterality: Left;  left, CDE: 7.44  . CHOLECYSTECTOMY N/A 02/14/2015   Procedure: LAPAROSCOPIC CHOLECYSTECTOMY;  Surgeon: Aviva Signs, MD;  Location: [facility] ORS;   Service: General;  Laterality: N/A;  . COLONOSCOPY N/A 04/22/2018   Procedure: COLONOSCOPY;  Surgeon: Aviva Signs, MD;  Location[facility];  Service: Gastroenterology;  Laterality: N/A;  . ESOPHAGOGASTRODUODENOSCOPY N/A 04/22/2018   Procedure: ESOPHAGOGASTRODUODENOSCOPY (EGD);  Surgeon: Aviva Signs, MD;  Location[facility];  Service: Gastroenterology;  Laterality: N/A;  . MASTECTOMY     bilateral mastectomy-right breast cancer-left taken by choice  . POLYPECTOMY  04/22/2018   Procedure: POLYPECTOMY;  Surgeon: Aviva Signs, MD;  Location: [facility];  Service: Gastroenterology;;  . Valarie Merino  1995    Current Medications: Outpatient Medications Prior to Visit  Medication Sig Dispense Refill  . acetaminophen (TYLENOL) 650 MG CR tablet Take 650 mg by mouth every 8 (eight) hours as needed for pain.    Marland Kitchen albuterol (VENTOLIN HFA) 108 (90 Base) MCG/ACT inhaler Inhale 1-2 puffs into the lungs every 6 (six) hours as needed for wheezing or shortness of breath.    . ALPRAZolam (XANAX) 0.5 MG tablet Take 0.5 mg by mouth at bedtime as needed for anxiety.     Marland Kitchen amiodarone (PACERONE) 200 MG tablet Take 1 tablet (200 mg total) by mouth daily. 90 tablet 2  . apixaban (ELIQUIS) 5 MG TABS tablet Take 1 tablet (5 mg total) by mouth 2 (two) times daily. 60 tablet 6  . calcium citrate-vitamin D (CITRACAL+D) 315-200 MG-UNIT tablet Take 1 tablet by mouth 2 (two) times daily.    . cyclobenzaprine (FLEXERIL) 5 MG tablet Take 1 tablet (5 mg total) by mouth 3 (three) times daily as needed for muscle spasms. 12 tablet 0  . docusate sodium (COLACE) 100 MG capsule Take 1 capsule (100 mg total) by mouth every 12 (twelve) hours. 30 capsule 0  . fluticasone (FLONASE) 50 MCG/ACT nasal spray Place 1 spray into both nostrils as needed.     . furosemide (LASIX) 40 MG tablet Take 1 tablet (40 mg total) by mouth daily. 90 tablet 1  . Lidocaine 4 % PTCH Apply 1 patch topically 2 (two) times daily. 12 patch 0  .  omeprazole (PRILOSEC) 20 MG capsule Take 20 mg by mouth daily as needed (for indigestion/stomach issues.).     Marland Kitchen oxyCODONE (ROXICODONE) 5 MG immediate release tablet Take 0.5-1 tablets (2.5-5 mg total) by mouth every 6 (six) hours as needed for severe pain. 20 tablet 0  . oxyCODONE-acetaminophen (PERCOCET/ROXICET) 5-325 MG tablet Take 2 tablets by mouth every 4 (four) hours as needed for severe pain. 6 tablet 0  . polyethylene glycol (MIRALAX / GLYCOLAX) 17 g packet Take 17 g by mouth daily. 14 each 0  . potassium chloride SA (KLOR-CON) 20 MEQ tablet Take 1 tablet (20 mEq total) by mouth daily. 90 tablet 3  . valACYclovir (VALTREX) 1000 MG tablet Take 1,000 mg by mouth 3 (three) times daily.     No facility-administered medications prior to visit.     Allergies:   Cardizem cd [diltiazem hcl er beads], Amiodarone, Metoprolol tartrate, Sulfa antibiotics, Tomato, Carvedilol, and Chocolate   Social History   Socioeconomic History  . Marital status: Married    Spouse name: [name]  . Number of children: Not  on file  . Years of education: Not on file  . Highest education level: Not on file  Occupational History  . Not on file  Tobacco Use  . Smoking status: Former Smoker    Packs/day: 1.00    Years: 28.00    Pack years: 28.00    Types: Cigarettes    Quit date: 07/17/1983    Years since quitting: 36.0  . Smokeless tobacco: Never Used  Substance and Sexual Activity  . Alcohol use: No  . Drug use: No  . Sexual activity: Never    Birth control/protection: Post-menopausal  Other Topics Concern  . Not on file  Social History Narrative  . Not on file   Social Determinants of Health   Financial Resource Strain:   . Difficulty of Paying Living Expenses:   Food Insecurity:   . Worried About Charity fundraiser in the Last Year:   . Arboriculturist in the Last Year:   Transportation Needs: No Transportation Needs  . Lack of Transportation (Medical): No  . Lack of Transportation  (Non-Medical): No  Physical Activity:   . Days of Exercise per Week:   . Minutes of Exercise per Session:   Stress:   . Feeling of Stress :   Social Connections: Unknown  . Frequency of Communication with Friends and Family: Not on file  . Frequency of Social Gatherings with Friends and Family: Not on file  . Attends Religious Services: Not on file  . Active Member of Clubs or Organizations: Not on file  . Attends Archivist Meetings: Not on file  . Marital Status: Married     Family History:  The patient's family history includes Aneurysm in her father; Cancer in her mother, sister, and sister.   Review of Systems:   Please see the history of present illness.     General:  No chills, fever, night sweats or weight changes. Positive for back pain.  Cardiovascular:  No chest pain, dyspnea on exertion, edema, orthopnea, palpitations, paroxysmal nocturnal dyspnea. Dermatological: No rash, lesions/masses Respiratory: No cough, dyspnea Urologic: No hematuria, dysuria Abdominal:   No nausea, vomiting, diarrhea, bright red blood per rectum, melena, or hematemesis Neurologic:  No visual changes, wkns, changes in mental status.  All other systems reviewed and are otherwise negative except as noted above.   Physical Exam:    VS:  BP (!) 144/80   Pulse (!) 106   Temp 98.9 F (37.2 C)   Ht 5\' 4"  (1.626 m)   Wt 137 lb (62.1 kg)   SpO2 98%   BMI 23.52 kg/m    General: Well developed, well nourished,female appearing in no acute distress. Head: Normocephalic, atraumatic, sclera non-icteric.  Neck: No carotid bruits. JVD not elevated.  Lungs: Respirations regular and unlabored, without wheezes or rales.  Heart: Irregularly irregular. No S3 or S4.  No rubs or gallops appreciated. 2/6 holosystolic murmur along Apex.  Abdomen: Soft, non-tender, non-distended. No obvious abdominal masses. Msk:  Strength and tone appear normal for age. No obvious joint deformities or  effusions. Extremities: No clubbing or cyanosis. No lower extremity edema.  Distal pedal pulses are 2+ bilaterally. Neuro: Alert and oriented X 3. Moves all extremities spontaneously. No focal deficits noted. Psych:  Responds to questions appropriately with a normal affect. Skin: No rashes or lesions noted  Wt Readings from Last 3 Encounters:  07/14/19 137 lb (62.1 kg)  06/09/19 136 lb (61.7 kg)  06/06/19 139 lb 1.8 oz (63.1  kg)     Studies/Labs Reviewed:   EKG:  EKG is not ordered today.  Recent Labs: 05/23/2019: ALT 29 06/06/2019: BUN 19; Creatinine, Ser 1.58; Hemoglobin 11.2; Platelets 235; Potassium 3.2; Sodium 139   Lipid Panel    Component Value Date/Time   CHOL 109 09/24/2016 0438   TRIG 66 09/24/2016 0438   HDL 34 (L) 09/24/2016 0438   CHOLHDL 3.2 09/24/2016 0438   VLDL 13 09/24/2016 0438   LDLCALC 62 09/24/2016 0438    Additional studies/ records that were reviewed today include:   Echocardiogram: 09/2016 Study Conclusions   - Left ventricle: The cavity size was normal. Wall thickness was  normal. Systolic function was normal. The estimated ejection  fraction was in the range of 55% to 60%. Wall motion was normal;  there were no regional wall motion abnormalities.  - Aortic valve: Mildly to moderately calcified annulus. Mildly  thickened leaflets. Valve area (VTI): 2.92 cm^2. Valve area  (Vmax): 2.73 cm^2. Valve area (Vmean): 2.61 cm^2.  - Mitral valve: There was moderate regurgitation.  - Left atrium: The atrium was severely dilated.  - Right atrium: The atrium was severely dilated.  - Atrial septum: No defect or patent foramen ovale was identified.  - Pulmonary arteries: Systolic pressure was moderately increased.  PA peak pressure: 49 mm Hg (S).  - Pericardium, extracardiac: There is a small circumferential  pericardial effusion.  - Technically adequate study.   Event Monitor: 11/2017 1. Atrial fib with a controlled VR and a RVR 2.  NSVT 3. No prolonged pauses or bradycardia  Gregg Taylor,M.D.  Assessment:    1. Permanent atrial fibrillation ([place])   2. Chronic diastolic CHF (congestive heart failure) ([place])   3. Moderate mitral regurgitation   4. Stage 3 chronic kidney disease, unspecified whether stage 3a or 3b CKD      Plan:   In order of problems listed above:  1. Permanent Atrial Fibrillation - treatment has been challenging as she failed DCCV and Tikosyn in the past. She has also been intolerant to Lopressor, Toprol-XL, Coreg, Cardizem, Cardizem CD, and Verapamil.  - She was restarted on Amiodarone in 04/2019 and has overall done well on this.  She denies any recurrent side effects. Pulse was initially elevated at 106 but rechecked upon sitting in the exam room and improved to 90 bpm. Has been well-controled when checked at home. Will continue current regimen with Amiodarone 200mg  daily. She did have recent labs with her PCP and will request a copy to make sure TSH and LFT's were checked. Also reviewed the importance of yearly eye exams.   2. Chronic Diastolic CHF - She denies any recent orthopnea, PND or lower extremity edema. Creatinine was elevated to 1.58 by recent labs in 05/2019 and she had repeat labs obtained yesterday by her PCP. Will request a copy of records. Remains on Lasix 40 mg daily.    3. Mitral Regurgitation - This was moderate by most recent echocardiogram in 09/2016. She denies any new dyspnea, dizziness or presyncope. Would anticipate a repeat echocardiogram in the next 1 to 2 years unless symptoms indicate this needs to be obtained in the interim.   4. Stage 3 CKD - baseline creatinine 1.2 - 1.3. Elevated to 1.58 in 05/2019. Will request a copy of recent labs.     Medication Adjustments/Labs and Tests Ordered: Current medicines are reviewed at length with the patient today.  Concerns regarding medicines are outlined above.  Medication changes, Labs and Tests ordered today  are listed in  the Patient Instructions below. Patient Instructions  Medication Instructions:  Your physician recommends that you continue on your current medications as directed. Please refer to the Current Medication list given to you today.  *If you need a refill on your cardiac medications before your next appointment, please call your pharmacy*   Lab Work: None today If you have labs (blood work) drawn today and your tests are completely normal, you will receive your results only by: Marland Kitchen MyChart Message (if you have MyChart) OR . A paper copy in the mail If you have any lab test that is abnormal or we need to change your treatment, we will call you to review the results.   Testing/Procedures: None today   Follow-Up: At [facility], you and your health needs are our priority.  As part of our continuing mission to provide you with exceptional heart care, we have created designated Provider Care Teams.  These Care Teams include your primary Cardiologist (physician) and Advanced Practice Providers (APPs -  Physician Assistants and Nurse Practitioners) who all work together to provide you with the care you need, when you need it.  We recommend signing up for the patient portal called "MyChart".  Sign up information is provided on this After Visit Summary.  MyChart is used to connect with patients for Virtual Visits (Telemedicine).  Patients are able to view lab/test results, encounter notes, upcoming appointments, etc.  Non-urgent messages can be sent to your provider as well.   To learn more about what you can do with MyChart, go to [email or web address].    Your next appointment:   6 month(s)  The format for your next appointment:   In Person  Provider:  Dr.Koneswaran or [name], PA-C   Other Instructions none    Thank you for choosing [facility] !        Signed, Erma Heritage, PA-C  07/14/2019 5:24 PM    [place] [address] [place], [place] [address] Phone: [phone] Fax: [phone]

## 2019-07-14 NOTE — Patient Instructions (Signed)
Medication Instructions:  Your physician recommends that you continue on your current medications as directed. Please refer to the Current Medication list given to you today.  *If you need a refill on your cardiac medications before your next appointment, please call your pharmacy*   Lab Work: None today If you have labs (blood work) drawn today and your tests are completely normal, you will receive your results only by: Marland Kitchen MyChart Message (if you have MyChart) OR . A paper copy in the mail If you have any lab test that is abnormal or we need to change your treatment, we will call you to review the results.   Testing/Procedures: None today   Follow-Up: At [facility], you and your health needs are our priority.  As part of our continuing mission to provide you with exceptional heart care, we have created designated Provider Care Teams.  These Care Teams include your primary Cardiologist (physician) and Advanced Practice Providers (APPs -  Physician Assistants and Nurse Practitioners) who all work together to provide you with the care you need, when you need it.  We recommend signing up for the patient portal called "MyChart".  Sign up information is provided on this After Visit Summary.  MyChart is used to connect with patients for Virtual Visits (Telemedicine).  Patients are able to view lab/test results, encounter notes, upcoming appointments, etc.  Non-urgent messages can be sent to your provider as well.   To learn more about what you can do with MyChart, go to [email or web address].    Your next appointment:   6 month(s)  The format for your next appointment:   In Person  Provider:  Dr.koneswaran or [name], PA-C   Other Instructions none       Thank you for choosing [facility] !

## 2019-07-20 ENCOUNTER — Other Ambulatory Visit: Payer: Self-pay | Admitting: *Deleted

## 2019-07-20 ENCOUNTER — Encounter: Payer: Self-pay | Admitting: *Deleted

## 2019-07-20 DIAGNOSIS — M8000XS Age-related osteoporosis with current pathological fracture, unspecified site, sequela: Secondary | ICD-10-CM | POA: Diagnosis not present

## 2019-07-20 DIAGNOSIS — I4821 Permanent atrial fibrillation: Secondary | ICD-10-CM | POA: Diagnosis not present

## 2019-07-20 DIAGNOSIS — N1832 Chronic kidney disease, stage 3b: Secondary | ICD-10-CM | POA: Diagnosis not present

## 2019-07-20 DIAGNOSIS — J4540 Moderate persistent asthma, uncomplicated: Secondary | ICD-10-CM | POA: Diagnosis not present

## 2019-07-20 DIAGNOSIS — Z0001 Encounter for general adult medical examination with abnormal findings: Secondary | ICD-10-CM | POA: Diagnosis not present

## 2019-07-20 DIAGNOSIS — R945 Abnormal results of liver function studies: Secondary | ICD-10-CM | POA: Diagnosis not present

## 2019-07-20 NOTE — Patient Outreach (Signed)
[name]) Care Management  07/20/2019  [place] 12-18-1940 [id number]   Third [facility] unsuccessful outreach nurse call andUnited Health care (UHC)UM (utilization Management)referred patient  Insuranceunited healthcare medicare   No [facility] admissions in the last 6 months  [place] visits x6in the last 6 months 3/16/21back pain. Recent diagnosisT 10compression fracture, T 11and T 12endplate fracture. 06/06/19 [facility] back pain severe back pain after twisting, know T-10 compression fracture, CT showingnew acute endplate fractures atT 00QQP 12 in addition to stable T 10closed wedgecompression fracture without vertebral height loss, TLSO splint, pain control 06/05/19 [facility] back pain after bending, felt something pop, xray= stableT 10compression fracture, pain control 05/23/19 [facility] flank pain, CT scan shows thoracic compression fracture, EKG shows Afib  04/13/19 [facility] constipation with low back pain  04/13/19 [facility] urgent care center left mid back pain secondary to constipation  Third Outreach attemptunsuccessful(to the home number) No answer at the home number and was not able to leave a voice message No answer to the listed mobile number but [facility] RN CM left HIPAA ([place] and Accountability Act) compliant voicemail message along with CM's contact info at the mobile number   Mrs Mcevoy has not been to the ED or hospitalized since 06/09/19 ED visit  Plans  [name] will be pended for case closure within the next4 businessdaysif no return call  [facility] RN CM sent an unsuccessful letter to this [facility] engaged patient on 07/06/19 after the second unsuccessful outreach attempt  The first unsuccessful outreach was on 06/22/19 with voice messages left  Routed note to MD   [place]. Lavina Hamman, RN, BSN, [place] Coordinator Office number [phone] Mobile number [phone]  Main [facility] number  [phone] Fax number [phone]

## 2019-07-22 ENCOUNTER — Encounter (HOSPITAL_COMMUNITY): Payer: Self-pay

## 2019-07-22 ENCOUNTER — Ambulatory Visit (HOSPITAL_COMMUNITY): Payer: Medicare Other

## 2019-07-22 ENCOUNTER — Other Ambulatory Visit (HOSPITAL_COMMUNITY): Payer: Medicare Other

## 2019-07-27 ENCOUNTER — Other Ambulatory Visit: Payer: Self-pay | Admitting: *Deleted

## 2019-07-27 NOTE — Patient Outreach (Signed)
[facility]) Care Management  07/27/2019  Bailey Bishop [birth year] [id number]   Case closure unable to maintain contact   Call attempts made on 06/22/19, 07/06/19 and 07/20/19 Unsuccessful outreach letter sent on 07/06/19 without a response as of 07/27/19  Plan [facility] RN CM will close case after no response from patient within 10 +business days. Unable to reach Case closure letters sent to patient and MDs (Dr Sharilyn Sites listed when case made active, now Dr [name] listed at Closure)  [place]. Lavina Hamman, RN, BSN, [place] Coordinator Office number [phone] Mobile number [phone]  Main [facility] number [phone] Fax number [phone]

## 2019-08-03 ENCOUNTER — Ambulatory Visit: Payer: Medicare Other | Admitting: *Deleted

## 2019-08-05 ENCOUNTER — Telehealth: Payer: Self-pay | Admitting: Student

## 2019-08-05 NOTE — Telephone Encounter (Signed)
   Does she still have Verapamil 40 mg tablets at home? I would recommend trying 40mg  twice daily while remaining on the Amiodarone. She reported constipation with Verapamil so perhaps it will help with her GI symptoms while also helping with her heart rate.  Her management has been challenging as she has been intolerant to Lopressor, Toprol-XL, Coreg, Cardizem and Cardizem CD in the past. [facility] has been helping manage her medications as well so I will route this message for their input as well.   Signed, Erma Heritage, PA-C 08/05/2019, 12:47 PM Pager: [phone]

## 2019-08-05 NOTE — Telephone Encounter (Signed)
Per pt phone call-- called stating she's had diarrhea since last week and her heart is 125, BP is 116/73 --thinks it's coming from the amiodarone (PACERONE) 200 MG tablet [[id number]] not working anymore  States she's feeling weak  Please give pt a call @ [phone]

## 2019-08-06 NOTE — Telephone Encounter (Signed)
Called pt no answer.  Left message to call back.

## 2019-08-07 MED ORDER — VERAPAMIL HCL 40 MG PO TABS: 40.0000 mg | ORAL_TABLET | Freq: Two times a day (BID) | ORAL | 6 refills | Status: DC

## 2019-08-07 NOTE — Telephone Encounter (Signed)
Pt notified and voiced understanding 

## 2019-08-11 NOTE — Telephone Encounter (Signed)
Patient called to clinic stating she tried the verapamil and amiodarone combination and it made her feel terrible - dropped her blood pressure so she stopped the amiodarone. After a few days the verapamil caused constipation which was the whole reason she stopped verapamil originally. When questioned of any recent changes that may have prompted the diarrhea as amiodarone does not normally cause diarrhea she does endorse having her 2nd covid vaccine a few days prior to diarrhea starting.  She is willing to retry amiodarone as she does not wish to continue on verapamil. Pt will start amiodarone tomorrow she will start back at 100mg  a day and see how her heart rates respond. If her HR is staying above 100 she will try increasing amiodarone 200mg  a day. She will call back in 1 week with update of symptoms.

## 2019-08-11 NOTE — Addendum Note (Signed)
Addended by: Juluis Mire on: 08/11/2019 01:53 PM   Modules accepted: Orders

## 2019-08-17 ENCOUNTER — Other Ambulatory Visit (HOSPITAL_COMMUNITY): Payer: Self-pay | Admitting: *Deleted

## 2019-08-17 MED ORDER — APIXABAN 5 MG PO TABS: 5.0000 mg | ORAL_TABLET | Freq: Two times a day (BID) | ORAL | 6 refills | Status: DC

## 2019-08-17 MED ORDER — FUROSEMIDE 40 MG PO TABS: 40.0000 mg | ORAL_TABLET | Freq: Every day | ORAL | 1 refills | Status: DC

## 2019-08-19 DIAGNOSIS — M545 Low back pain: Secondary | ICD-10-CM | POA: Diagnosis not present

## 2019-08-19 DIAGNOSIS — G4700 Insomnia, unspecified: Secondary | ICD-10-CM | POA: Diagnosis not present

## 2019-09-02 ENCOUNTER — Telehealth (HOSPITAL_COMMUNITY): Payer: Self-pay | Admitting: *Deleted

## 2019-09-02 MED ORDER — VERAPAMIL HCL 40 MG PO TABS: 40.0000 mg | ORAL_TABLET | Freq: Three times a day (TID) | ORAL | Status: DC

## 2019-09-02 NOTE — Telephone Encounter (Signed)
Patient called in stating last week she started with N/V and severe diarrhea - she stopped her amiodarone and resumed her verapamil. Instructed pt to follow up with [name] [place] for management. Pt in agreement.

## 2019-09-03 ENCOUNTER — Inpatient Hospital Stay (HOSPITAL_COMMUNITY)
Admission: EM | Admit: 2019-09-03 | Discharge: 2019-09-06 | DRG: 812 | Disposition: A | Discharge status: Home / Self Care | Payer: Medicare Other | Attending: Internal Medicine | Admitting: Internal Medicine

## 2019-09-03 ENCOUNTER — Encounter (HOSPITAL_COMMUNITY): Payer: Self-pay | Admitting: Emergency Medicine

## 2019-09-03 ENCOUNTER — Other Ambulatory Visit: Payer: Self-pay

## 2019-09-03 ENCOUNTER — Emergency Department (HOSPITAL_COMMUNITY): Payer: Medicare Other

## 2019-09-03 DIAGNOSIS — I10 Essential (primary) hypertension: Secondary | ICD-10-CM | POA: Diagnosis present

## 2019-09-03 DIAGNOSIS — I4819 Other persistent atrial fibrillation: Secondary | ICD-10-CM | POA: Diagnosis not present

## 2019-09-03 DIAGNOSIS — Z87891 Personal history of nicotine dependence: Secondary | ICD-10-CM | POA: Diagnosis not present

## 2019-09-03 DIAGNOSIS — R0602 Shortness of breath: Secondary | ICD-10-CM | POA: Diagnosis not present

## 2019-09-03 DIAGNOSIS — I5032 Chronic diastolic (congestive) heart failure: Secondary | ICD-10-CM | POA: Diagnosis present

## 2019-09-03 DIAGNOSIS — K222 Esophageal obstruction: Secondary | ICD-10-CM | POA: Diagnosis not present

## 2019-09-03 DIAGNOSIS — I517 Cardiomegaly: Secondary | ICD-10-CM | POA: Diagnosis not present

## 2019-09-03 DIAGNOSIS — K219 Gastro-esophageal reflux disease without esophagitis: Secondary | ICD-10-CM | POA: Diagnosis not present

## 2019-09-03 DIAGNOSIS — E876 Hypokalemia: Secondary | ICD-10-CM | POA: Diagnosis not present

## 2019-09-03 DIAGNOSIS — Z7901 Long term (current) use of anticoagulants: Secondary | ICD-10-CM

## 2019-09-03 DIAGNOSIS — I130 Hypertensive heart and chronic kidney disease with heart failure and stage 1 through stage 4 chronic kidney disease, or unspecified chronic kidney disease: Secondary | ICD-10-CM | POA: Diagnosis present

## 2019-09-03 DIAGNOSIS — N1832 Chronic kidney disease, stage 3b: Secondary | ICD-10-CM | POA: Diagnosis present

## 2019-09-03 DIAGNOSIS — R1314 Dysphagia, pharyngoesophageal phase: Secondary | ICD-10-CM | POA: Diagnosis present

## 2019-09-03 DIAGNOSIS — Z853 Personal history of malignant neoplasm of breast: Secondary | ICD-10-CM | POA: Diagnosis not present

## 2019-09-03 DIAGNOSIS — J449 Chronic obstructive pulmonary disease, unspecified: Secondary | ICD-10-CM | POA: Diagnosis present

## 2019-09-03 DIAGNOSIS — J439 Emphysema, unspecified: Secondary | ICD-10-CM | POA: Diagnosis present

## 2019-09-03 DIAGNOSIS — I4821 Permanent atrial fibrillation: Secondary | ICD-10-CM | POA: Diagnosis not present

## 2019-09-03 DIAGNOSIS — R0600 Dyspnea, unspecified: Secondary | ICD-10-CM | POA: Diagnosis not present

## 2019-09-03 DIAGNOSIS — K635 Polyp of colon: Secondary | ICD-10-CM | POA: Diagnosis not present

## 2019-09-03 DIAGNOSIS — D509 Iron deficiency anemia, unspecified: Secondary | ICD-10-CM | POA: Diagnosis not present

## 2019-09-03 DIAGNOSIS — K921 Melena: Secondary | ICD-10-CM | POA: Diagnosis not present

## 2019-09-03 DIAGNOSIS — Z20822 Contact with and (suspected) exposure to covid-19: Secondary | ICD-10-CM | POA: Diagnosis present

## 2019-09-03 DIAGNOSIS — D649 Anemia, unspecified: Secondary | ICD-10-CM | POA: Diagnosis not present

## 2019-09-03 DIAGNOSIS — K2970 Gastritis, unspecified, without bleeding: Secondary | ICD-10-CM | POA: Diagnosis present

## 2019-09-03 DIAGNOSIS — N183 Chronic kidney disease, stage 3 unspecified: Secondary | ICD-10-CM | POA: Diagnosis not present

## 2019-09-03 DIAGNOSIS — Z809 Family history of malignant neoplasm, unspecified: Secondary | ICD-10-CM

## 2019-09-03 DIAGNOSIS — Z79899 Other long term (current) drug therapy: Secondary | ICD-10-CM

## 2019-09-03 DIAGNOSIS — K922 Gastrointestinal hemorrhage, unspecified: Secondary | ICD-10-CM | POA: Diagnosis present

## 2019-09-03 DIAGNOSIS — K319 Disease of stomach and duodenum, unspecified: Secondary | ICD-10-CM | POA: Diagnosis not present

## 2019-09-03 DIAGNOSIS — M1990 Unspecified osteoarthritis, unspecified site: Secondary | ICD-10-CM | POA: Diagnosis not present

## 2019-09-03 DIAGNOSIS — K5730 Diverticulosis of large intestine without perforation or abscess without bleeding: Secondary | ICD-10-CM | POA: Diagnosis present

## 2019-09-03 DIAGNOSIS — R1310 Dysphagia, unspecified: Secondary | ICD-10-CM | POA: Diagnosis not present

## 2019-09-03 DIAGNOSIS — Z79891 Long term (current) use of opiate analgesic: Secondary | ICD-10-CM

## 2019-09-03 DIAGNOSIS — F419 Anxiety disorder, unspecified: Secondary | ICD-10-CM | POA: Diagnosis present

## 2019-09-03 DIAGNOSIS — J438 Other emphysema: Secondary | ICD-10-CM | POA: Diagnosis not present

## 2019-09-03 LAB — COMPREHENSIVE METABOLIC PANEL
ALT: 10 U/L (ref 0–44)
AST: 22 U/L (ref 15–41)
Albumin: 3.5 g/dL (ref 3.5–5.0)
Alkaline Phosphatase: 123 U/L (ref 38–126)
Anion gap: 12 (ref 5–15)
BUN: 11 mg/dL (ref 8–23)
CO2: 27 mmol/L (ref 22–32)
Calcium: 8.6 mg/dL — ABNORMAL LOW (ref 8.9–10.3)
Chloride: 97 mmol/L — ABNORMAL LOW (ref 98–111)
Creatinine, Ser: 1.43 mg/dL — ABNORMAL HIGH (ref 0.44–1.00)
GFR calc Af Amer: 40 mL/min — ABNORMAL LOW (ref >60)
GFR calc non Af Amer: 35 mL/min — ABNORMAL LOW (ref >60)
Glucose, Bld: 100 mg/dL — ABNORMAL HIGH (ref 70–99)
Potassium: 2.7 mmol/L — CL (ref 3.5–5.1)
Sodium: 136 mmol/L (ref 135–145)
Total Bilirubin: 1.4 mg/dL — ABNORMAL HIGH (ref 0.3–1.2)
Total Protein: 7.3 g/dL (ref 6.5–8.1)

## 2019-09-03 LAB — TROPONIN I (HIGH SENSITIVITY)
Troponin I (High Sensitivity): 15 ng/L (ref <18)
Troponin I (High Sensitivity): 13 ng/L (ref <18)

## 2019-09-03 LAB — CBC WITH DIFFERENTIAL/PLATELET
Abs Immature Granulocytes: 0.02 10*3/uL (ref 0.00–0.07)
Basophils Absolute: 0 10*3/uL (ref 0.0–0.1)
Basophils Relative: 1 %
Eosinophils Absolute: 0.3 10*3/uL (ref 0.0–0.5)
Eosinophils Relative: 4 %
HCT: 28.1 % — ABNORMAL LOW (ref 36.0–46.0)
Hemoglobin: 8.9 g/dL — ABNORMAL LOW (ref 12.0–15.0)
Immature Granulocytes: 0 %
Lymphocytes Relative: 12 %
Lymphs Abs: 0.8 10*3/uL (ref 0.7–4.0)
MCH: 28.5 pg (ref 26.0–34.0)
MCHC: 31.7 g/dL (ref 30.0–36.0)
MCV: 90.1 fL (ref 80.0–100.0)
Monocytes Absolute: 0.7 10*3/uL (ref 0.1–1.0)
Monocytes Relative: 11 %
Neutro Abs: 4.8 10*3/uL (ref 1.7–7.7)
Neutrophils Relative %: 72 %
Platelets: 229 10*3/uL (ref 150–400)
RBC: 3.12 MIL/uL — ABNORMAL LOW (ref 3.87–5.11)
RDW: 14.6 % (ref 11.5–15.5)
WBC: 6.7 10*3/uL (ref 4.0–10.5)
nRBC: 0 % (ref 0.0–0.2)

## 2019-09-03 LAB — BRAIN NATRIURETIC PEPTIDE: B Natriuretic Peptide: 715 pg/mL — ABNORMAL HIGH (ref 0.0–100.0)

## 2019-09-03 LAB — MAGNESIUM: Magnesium: 2.2 mg/dL (ref 1.7–2.4)

## 2019-09-03 LAB — SARS CORONAVIRUS 2 (TAT 6-24 HRS): SARS Coronavirus 2: NEGATIVE

## 2019-09-03 LAB — POC OCCULT BLOOD, ED: Fecal Occult Bld: POSITIVE — AB

## 2019-09-03 MED ORDER — SODIUM CHLORIDE 0.9% FLUSH
3.0000 mL | Freq: Two times a day (BID) | INTRAVENOUS | Status: DC
  Administered 2019-09-03 – 2019-09-05 (×4): 3 mL via INTRAVENOUS

## 2019-09-03 MED ORDER — FUROSEMIDE 10 MG/ML IJ SOLN
20.0000 mg | Freq: Two times a day (BID) | INTRAMUSCULAR | Status: DC
  Administered 2019-09-04 – 2019-09-06 (×5): 20 mg via INTRAVENOUS
  Filled 2019-09-03 (×5): qty 2

## 2019-09-03 MED ORDER — TRAZODONE HCL 50 MG PO TABS
50.0000 mg | ORAL_TABLET | Freq: Every day | ORAL | Status: DC
  Administered 2019-09-03 – 2019-09-05 (×3): 50 mg via ORAL
  Filled 2019-09-03 (×3): qty 1

## 2019-09-03 MED ORDER — ACETAMINOPHEN 325 MG PO TABS: 650.0000 mg | ORAL_TABLET | Freq: Four times a day (QID) | ORAL | Status: DC | PRN

## 2019-09-03 MED ORDER — OXYCODONE HCL 5 MG PO TABS: 5.0000 mg | ORAL_TABLET | ORAL | Status: DC | PRN

## 2019-09-03 MED ORDER — ALBUTEROL SULFATE (2.5 MG/3ML) 0.083% IN NEBU
2.5000 mg | INHALATION_SOLUTION | RESPIRATORY_TRACT | Status: DC | PRN
  Administered 2019-09-04: 2.5 mg via RESPIRATORY_TRACT
  Filled 2019-09-03: qty 3

## 2019-09-03 MED ORDER — POTASSIUM CHLORIDE CRYS ER 20 MEQ PO TBCR
60.0000 meq | EXTENDED_RELEASE_TABLET | Freq: Once | ORAL | Status: AC
  Administered 2019-09-03: 60 meq via ORAL
  Filled 2019-09-03: qty 3

## 2019-09-03 MED ORDER — ONDANSETRON HCL 4 MG/2ML IJ SOLN
4.0000 mg | Freq: Four times a day (QID) | INTRAMUSCULAR | Status: DC | PRN
  Filled 2019-09-03: qty 2

## 2019-09-03 MED ORDER — VERAPAMIL HCL 80 MG PO TABS
40.0000 mg | ORAL_TABLET | Freq: Three times a day (TID) | ORAL | Status: DC
  Administered 2019-09-03 – 2019-09-06 (×10): 40 mg via ORAL
  Filled 2019-09-03 (×10): qty 1

## 2019-09-03 MED ORDER — POTASSIUM CHLORIDE 10 MEQ/100ML IV SOLN
10.0000 meq | INTRAVENOUS | Status: AC
  Administered 2019-09-03 (×2): 10 meq via INTRAVENOUS
  Filled 2019-09-03 (×2): qty 100

## 2019-09-03 MED ORDER — CALCIUM CARBONATE-VITAMIN D 500-200 MG-UNIT PO TABS
1.0000 | ORAL_TABLET | Freq: Two times a day (BID) | ORAL | Status: DC
  Administered 2019-09-03 – 2019-09-06 (×6): 1 via ORAL
  Filled 2019-09-03 (×6): qty 1

## 2019-09-03 MED ORDER — POLYETHYLENE GLYCOL 3350 17 G PO PACK: 17.0000 g | PACK | Freq: Every day | ORAL | Status: DC | PRN

## 2019-09-03 MED ORDER — ALPRAZOLAM 0.5 MG PO TABS: 0.5000 mg | ORAL_TABLET | Freq: Every evening | ORAL | Status: DC | PRN

## 2019-09-03 MED ORDER — POTASSIUM CHLORIDE CRYS ER 20 MEQ PO TBCR
40.0000 meq | EXTENDED_RELEASE_TABLET | ORAL | Status: AC
  Administered 2019-09-03 (×2): 40 meq via ORAL
  Filled 2019-09-03 (×2): qty 2

## 2019-09-03 MED ORDER — FUROSEMIDE 10 MG/ML IJ SOLN
40.0000 mg | Freq: Once | INTRAMUSCULAR | Status: AC
  Administered 2019-09-03: 40 mg via INTRAVENOUS
  Filled 2019-09-03: qty 4

## 2019-09-03 MED ORDER — ALBUTEROL SULFATE HFA 108 (90 BASE) MCG/ACT IN AERS
1.0000 | INHALATION_SPRAY | Freq: Four times a day (QID) | RESPIRATORY_TRACT | Status: DC | PRN
  Administered 2019-09-03: 2 via RESPIRATORY_TRACT
  Filled 2019-09-03: qty 6.7

## 2019-09-03 MED ORDER — POTASSIUM CHLORIDE CRYS ER 20 MEQ PO TBCR
20.0000 meq | EXTENDED_RELEASE_TABLET | Freq: Every day | ORAL | Status: DC
  Administered 2019-09-04 – 2019-09-06 (×3): 20 meq via ORAL
  Filled 2019-09-03: qty 1
  Filled 2019-09-03: qty 2
  Filled 2019-09-03: qty 1

## 2019-09-03 MED ORDER — SODIUM CHLORIDE 0.9 % IV SOLN: INTRAVENOUS | Status: DC

## 2019-09-03 MED ORDER — SODIUM CHLORIDE 0.9% FLUSH: 3.0000 mL | INTRAVENOUS | Status: DC | PRN

## 2019-09-03 MED ORDER — PANTOPRAZOLE SODIUM 40 MG IV SOLR
40.0000 mg | Freq: Two times a day (BID) | INTRAVENOUS | Status: DC
  Administered 2019-09-03 – 2019-09-06 (×6): 40 mg via INTRAVENOUS
  Filled 2019-09-03 (×6): qty 40

## 2019-09-03 MED ORDER — SODIUM CHLORIDE 0.9 % IV SOLN: 250.0000 mL | INTRAVENOUS | Status: DC | PRN

## 2019-09-03 MED ORDER — FENTANYL CITRATE (PF) 100 MCG/2ML IJ SOLN: 12.5000 ug | INTRAMUSCULAR | Status: DC | PRN

## 2019-09-03 MED ORDER — ONDANSETRON HCL 4 MG PO TABS: 4.0000 mg | ORAL_TABLET | Freq: Four times a day (QID) | ORAL | Status: DC | PRN

## 2019-09-03 NOTE — ED Triage Notes (Signed)
Pt c/o of sob x 7 days. Worsening with morning. Pt states her afib meds were switched Monday due to amiodarone making her sob.

## 2019-09-03 NOTE — ED Provider Notes (Signed)
[facility] EMERGENCY DEPARTMENT Provider Note   CSN: [id number] Arrival date & time: 09/03/19  6270     History Chief Complaint  Patient presents with  . Shortness of Breath    Bailey Bishop is a 79 y.o. female.  HPI   79 year old female with dyspnea.  Onset about a week ago.  Persistent since.  Mildly short of breath.  Does endorse orthopnea.  No unusual swelling.  She attributes her symptoms to amiodarone.  This has been stopped though for about a week but symptoms have persisted.  No fevers or chills.  No acute cough.  History of A. fib and anticoagulated.  Past Medical History:  Diagnosis Date  . Anxiety   . Arthritis   . Asthma   . Atrial fibrillation ([place])    a. s/p unsuccessful DCCV in 01/2017 and intolerant to Amiodarone --> Rate-control strategy pursued since b. failed Tikosyn in 11/2017  . Cancer [facility]) 2001   right breast  . Dyspnea   . GERD (gastroesophageal reflux disease)   . Hypertension   . T10 vertebral fracture [name])     Patient Active Problem List   Diagnosis Date Noted  . Iron deficiency anemia   . Benign neoplasm of transverse colon   . Diverticulosis of large intestine without diverticulitis   . Visit for monitoring Tikosyn therapy 12/02/2017  . Atrial fibrillation ([place]) 03/31/2017  . Persistent atrial fibrillation ([place])   . Anticoagulated 10/03/2016  . COPD (chronic obstructive pulmonary disease) ([place]) 10/03/2016  . Hypokalemia 09/24/2016  . Asthma in adult without complication 35/00/9381  . CKD (chronic kidney disease), stage II 09/23/2016  . Hyperglycemia 09/23/2016  . Anxiety   . GERD (gastroesophageal reflux disease)   . Shortness of breath   . Essential hypertension   . Status asthmaticus 07/07/2014    Past Surgical History:  Procedure Laterality Date  . BIOPSY N/A 04/22/2018   Procedure: BIOPSY;  Surgeon: Aviva Signs, MD;  Location: [facility] ENDO SUITE;  Service: Gastroenterology;  Laterality: N/A;  . CARDIOVERSION N/A  02/18/2017   Procedure: CARDIOVERSION;  Surgeon: Josue Hector, MD;  Location: [facility] ENDOSCOPY;  Service: Cardiovascular;  Laterality: N/A;  . CARDIOVERSION N/A 12/04/2017   Procedure: CARDIOVERSION;  Surgeon: [name], MD;  Location: [place];  Service: Cardiovascular;  Laterality: N/A;  . CATARACT EXTRACTION W/PHACO  07/23/2011   Procedure: CATARACT EXTRACTION PHACO AND INTRAOCULAR LENS PLACEMENT ([place]);  Surgeon: Williams Che, MD;  Location: [facility] ORS;  Service: Ophthalmology;  Laterality: Right;  CDE:9.96  . CATARACT EXTRACTION W/PHACO Left 11/13/2018   Procedure: CATARACT EXTRACTION PHACO AND INTRAOCULAR LENS PLACEMENT (IOC);  Surgeon: Baruch Goldmann, MD;  Location: [facility] ORS;  Service: Ophthalmology;  Laterality: Left;  left, CDE: 7.44  . CHOLECYSTECTOMY N/A 02/14/2015   Procedure: LAPAROSCOPIC CHOLECYSTECTOMY;  Surgeon: Aviva Signs, MD;  Location: [facility] ORS;  Service: General;  Laterality: N/A;  . COLONOSCOPY N/A 04/22/2018   Procedure: COLONOSCOPY;  Surgeon: Aviva Signs, MD;  Location: [facility] ENDO SUITE;  Service: Gastroenterology;  Laterality: N/A;  . ESOPHAGOGASTRODUODENOSCOPY N/A 04/22/2018   Procedure: ESOPHAGOGASTRODUODENOSCOPY (EGD);  Surgeon: Aviva Signs, MD;  Location: [facility] END[facility]ITE;  Service: Gastroenterology;  Laterality: N/A;  . MASTECTOMY     bilateral mastectomy-right breast cancer-left taken by choice  . POLYPECTOMY  04/22/2018   Procedure: POLYPECTOMY;  Surgeon: Aviva Signs, MD;  Location: [facility] ENDO [facility];  Service: Gastroenterology;;  . SEPTOPLASTY  1995     OB History   No obstetric history on file.  Family History  Problem Relation Age of Onset  . Cancer Mother   . Aneurysm Father   . Cancer Sister   . Cancer Sister   . Anesthesia problems Neg Hx   . Hypotension Neg Hx   . Malignant hyperthermia Neg Hx   . Pseudochol deficiency Neg Hx   . Colon cancer Neg Hx     Social History   Tobacco Use  . Smoking status: Former Smoker    Packs/day: 1.00     Years: 28.00    Pack years: 28.00    Types: Cigarettes    Quit date: 07/17/1983    Years since quitting: 36.1  . Smokeless tobacco: Never Used  Vaping Use  . Vaping Use: Never used  Substance Use Topics  . Alcohol use: No  . Drug use: No    Home Medications Prior to Admission medications   Medication Sig Start Date End Date Taking? Authorizing Provider  acetaminophen (TYLENOL) 650 MG CR tablet Take 650 mg by mouth every 8 (eight) hours as needed for pain.   Yes [provider]  albuterol (VENTOLIN HFA) 108 (90 Base) MCG/ACT inhaler Inhale 1-2 puffs into the lungs every 6 (six) hours as needed for wheezing or shortness of breath.   Yes [provider]  ALPRAZolam Duanne Moron) 0.5 MG tablet Take 0.5 mg by mouth at bedtime as needed for anxiety.  06/18/14  Yes [provider]  apixaban (ELIQUIS) 5 MG TABS tablet Take 1 tablet (5 mg total) by mouth 2 (two) times daily. 08/17/19  Yes Sherran Needs, NP  calcium citrate-vitamin D (CITRACAL+D) 315-200 MG-UNIT tablet Take 1 tablet by mouth 2 (two) times daily.   Yes [provider]  fluticasone (FLONASE) 50 MCG/ACT nasal spray Place 1 spray into both nostrils as needed.  12/26/18  Yes [provider]  furosemide (LASIX) 40 MG tablet Take 1 tablet (40 mg total) by mouth daily. 08/17/19  Yes Sherran Needs, NP  omeprazole (PRILOSEC) 20 MG capsule Take 20 mg by mouth daily as needed (for indigestion/stomach issues.).  02/14/16  Yes [provider]  potassium chloride SA (KLOR-CON) 20 MEQ tablet Take 1 tablet (20 mEq total) by mouth daily. 04/30/19  Yes Sherran Needs, NP  traZODone (DESYREL) 50 MG tablet Take 50 mg by mouth at bedtime as needed. 08/19/19  Yes [provider]  verapamil (CALAN) 40 MG tablet Take 1 tablet (40 mg total) by mouth 3 (three) times daily. Patient taking differently: Take 40 mg by mouth daily.  09/02/19  Yes Sherran Needs, NP  cyclobenzaprine (FLEXERIL) 5 MG tablet  Take 1 tablet (5 mg total) by mouth 3 (three) times daily as needed for muscle spasms. Patient not taking: Reported on 09/03/2019 06/05/19   Lucrezia Starch, MD  docusate sodium (COLACE) 100 MG capsule Take 1 capsule (100 mg total) by mouth every 12 (twelve) hours. Patient not taking: Reported on 09/03/2019 06/06/19   Margarita Mail, PA-C  Lidocaine 4 % PTCH Apply 1 patch topically 2 (two) times daily. Patient not taking: Reported on 09/03/2019 06/09/19   Varney Biles, MD  oxyCODONE (ROXICODONE) 5 MG immediate release tablet Take 0.5-1 tablets (2.5-5 mg total) by mouth every 6 (six) hours as needed for severe pain. Patient not taking: Reported on 09/03/2019 06/06/19   Margarita Mail, PA-C  oxyCODONE-acetaminophen (PERCOCET/ROXICET) 5-325 MG tablet Take 2 tablets by mouth every 4 (four) hours as needed for severe pain. Patient not taking: Reported on 09/03/2019 06/06/19  Margarita Mail, PA-C    Allergies    Cardizem cd [diltiazem hcl er beads], Amiodarone, Metoprolol tartrate, Sulfa antibiotics, Tomato, Carvedilol, and Chocolate  Review of Systems   Review of Systems All systems reviewed and negative, other than as noted in HPI.  Physical Exam Updated Vital Signs BP 130/78   Pulse (!) 107   Temp 98 F (36.7 C)   Resp (!) 29   Ht 5\' 4"  (1.626 m)   Wt 62.1 kg   SpO2 97%   BMI 23.52 kg/m   Physical Exam Vitals and nursing note reviewed.  Constitutional:      General: She is not in acute distress.    Appearance: She is well-developed.  HENT:     Head: Normocephalic and atraumatic.  Eyes:     General:        Right eye: No discharge.        Left eye: No discharge.     Conjunctiva/sclera: Conjunctivae normal.  Cardiovascular:     Rate and Rhythm: Normal rate. Rhythm irregular.     Heart sounds: Normal heart sounds. No murmur heard.  No friction rub. No gallop.   Pulmonary:     Effort: Pulmonary effort is normal. No respiratory distress.     Breath sounds: Normal breath  sounds.  Abdominal:     General: There is no distension.     Palpations: Abdomen is soft.     Tenderness: There is no abdominal tenderness.  Musculoskeletal:        General: No tenderness.     Cervical back: Neck supple.  Skin:    General: Skin is warm and dry.  Neurological:     Mental Status: She is alert.  Psychiatric:        Behavior: Behavior normal.        Thought Content: Thought content normal.     ED Results / Procedures / Treatments   Labs (all labs ordered are listed, but only abnormal results are displayed) Labs Reviewed  CBC WITH DIFFERENTIAL/PLATELET - Abnormal; Notable for the following components:      Result Value   RBC 3.12 (*)    Hemoglobin 8.9 (*)    HCT 28.1 (*)    All other components within normal limits  BRAIN NATRIURETIC PEPTIDE - Abnormal; Notable for the following components:   B Natriuretic Peptide 715.0 (*)    All other components within normal limits  COMPREHENSIVE METABOLIC PANEL - Abnormal; Notable for the following components:   Potassium 2.7 (*)    Chloride 97 (*)    Glucose, Bld 100 (*)    Creatinine, Ser 1.43 (*)    Calcium 8.6 (*)    Total Bilirubin 1.4 (*)    GFR calc non Af Amer 35 (*)    GFR calc Af Amer 40 (*)    All other components within normal limits  BASIC METABOLIC PANEL - Abnormal; Notable for the following components:   Creatinine, Ser 1.51 (*)    Calcium 8.7 (*)    GFR calc non Af Amer 32 (*)    GFR calc Af Amer 37 (*)    All other components within normal limits  CBC - Abnormal; Notable for the following components:   RBC 3.01 (*)    Hemoglobin 8.4 (*)    HCT 27.3 (*)    All other components within normal limits  POC OCCULT BLOOD, ED - Abnormal; Notable for the following components:   Fecal Occult Bld POSITIVE (*)  All other components within normal limits  SARS CORONAVIRUS 2 (TAT 6-24 HRS)  MAGNESIUM  GLUCOSE, CAPILLARY  OCCULT BLOOD X 1 CARD TO LAB, STOOL  TROPONIN I (HIGH SENSITIVITY)  TROPONIN I (HIGH  SENSITIVITY)    EKG EKG Interpretation  Date/Time:  Thursday September 03 2019 09:08:10 EDT Ventricular Rate:  111 PR Interval:    QRS Duration: 86 QT Interval:  405 QTC Calculation: 493 R Axis:   -33 Text Interpretation: Atrial fibrillation Paired ventricular premature complexes Left axis deviation Anterior infarct, old Artifact in lead(s) I II III aVR aVL aVF V1 V2 V4 V5 Confirmed by Virgel Manifold [id number]) on 09/03/2019 9:35:52 AM   Radiology DG Chest Portable 1 View  Result Date: 09/03/2019 CLINICAL DATA:  Increasing shortness of breath over the past 7 days. EXAM: PORTABLE CHEST 1 VIEW COMPARISON:  PA and lateral chest 03/29/2017. FINDINGS: There is cardiomegaly without edema. Lungs are emphysematous. No consolidative process, pneumothorax or effusion. Atherosclerosis noted. IMPRESSION: No acute disease. Cardiomegaly. Aortic Atherosclerosis (ICD10-I70.0) and Emphysema (ICD10-J43.9). Electronically Signed   By: Inge Rise M.D.   On: 09/03/2019 10:07    Procedures Procedures (including critical care time)  Medications Ordered in ED Medications  ALPRAZolam (XANAX) tablet 0.5 mg (has no administration in time range)  acetaminophen (TYLENOL) tablet 650 mg (has no administration in time range)  albuterol (VENTOLIN HFA) 108 (90 Base) MCG/ACT inhaler 1-2 puff (2 puffs Inhalation Given 09/03/19 1908)  calcium-vitamin D (OSCAL WITH D) 500-200 MG-UNIT per tablet 1 tablet (1 tablet Oral Given 09/04/19 0825)  potassium chloride SA (KLOR-CON) CR tablet 20 mEq (20 mEq Oral Given 09/04/19 0825)  verapamil (CALAN) tablet 40 mg (40 mg Oral Given 09/04/19 0824)  traZODone (DESYREL) tablet 50 mg (50 mg Oral Given 09/03/19 2022)  sodium chloride flush (NS) 0.9 % injection 3 mL (3 mLs Intravenous Given 09/04/19 0832)  sodium chloride flush (NS) 0.9 % injection 3 mL (has no administration in time range)  0.9 %  sodium chloride infusion (has no administration in time range)  polyethylene glycol (MIRALAX /  GLYCOLAX) packet 17 g (has no administration in time range)  ondansetron (ZOFRAN) tablet 4 mg (has no administration in time range)    Or  ondansetron (ZOFRAN) injection 4 mg (has no administration in time range)  albuterol (PROVENTIL) (2.5 MG/3ML) 0.083% nebulizer solution 2.5 mg (has no administration in time range)  0.9 %  sodium chloride infusion ( Intravenous Rate/Dose Verify 09/04/19 0308)  fentaNYL (SUBLIMAZE) injection 12.5-50 mcg (has no administration in time range)  oxyCODONE (Oxy IR/ROXICODONE) immediate release tablet 5 mg (has no administration in time range)  pantoprazole (PROTONIX) injection 40 mg (40 mg Intravenous Given 09/04/19 0823)  furosemide (LASIX) injection 20 mg (20 mg Intravenous Given 09/04/19 0824)  polyethylene glycol-electrolytes (NuLYTELY) solution 2,000 mL (has no administration in time range)  polyethylene glycol-electrolytes (NuLYTELY) solution 2,000 mL (has no administration in time range)  potassium chloride SA (KLOR-CON) CR tablet 60 mEq (60 mEq Oral Given 09/03/19 1146)  potassium chloride 10 mEq in 100 mL IVPB (0 mEq Intravenous Stopped 09/03/19 1413)  potassium chloride SA (KLOR-CON) CR tablet 40 mEq (40 mEq Oral Given 09/03/19 2019)  furosemide (LASIX) injection 40 mg (40 mg Intravenous Given 09/03/19 2019)    ED Course  I have reviewed the triage vital signs and the nursing notes.  Pertinent labs & imaging results that were available during my care of the patient were reviewed by me and considered in my medical decision making (see  chart for details).    MDM Rules/Calculators/A&P                          78yF with dyspnea. Some concern that may be from worsening anemia.  Hemoglobin 11.23 months ago.  Today 8.9.  She is anticoagulated.  Will check stool for evidence of occult bleeding.  I do not have an obvious alternative explanation for dyspnea otherwise.  O2 sats are fine on room air.  She does not seem to be overtly volume overloaded.  Chest x-ray  without acute abnormality.  Looks healthy on exam.  Incidentally noted to be hypokalemic.  Will check magnesium level and replete.  Admission. Final Clinical Impression(s) / ED Diagnoses Final diagnoses:  Dyspnea, unspecified type  Anemia, unspecified type    Rx / DC Orders ED Discharge Orders    None       Virgel Manifold, MD 09/04/19 1142

## 2019-09-03 NOTE — ED Notes (Signed)
Family updated as to patient's status.

## 2019-09-03 NOTE — Plan of Care (Signed)
  Problem: Clinical Measurements: Goal: Cardiovascular complication will be avoided Outcome: Progressing   Problem: Clinical Measurements: Goal: Respiratory complications will improve Outcome: Progressing   Problem: Clinical Measurements: Goal: Will remain free from infection Outcome: Progressing   Problem: Pain Managment: Goal: General experience of comfort will improve Outcome: Progressing   Problem: Safety: Goal: Ability to remain free from injury will improve Outcome: Progressing   Problem: Skin Integrity: Goal: Risk for impaired skin integrity will decrease Outcome: Progressing   

## 2019-09-03 NOTE — ED Notes (Signed)
Date and time results received: 09/03/19 1056 (use smartphrase ".now" to insert current time)  Test: potassium Critical Value: 2.7  Name of Provider Notified: Dr. Wilson Singer  Orders Received? Or Actions Taken?: n/a

## 2019-09-03 NOTE — Progress Notes (Signed)
Pt c/o increased SOB. Breath sounds with loud, insp and exp wheezing noted left chest. Had pt cough and deep breathe, wheeze cleared when pt expectorated thin, clear phlegm. Pt still slightly SOB, SaO2 98% on RA. Resp therapy notified for pt to get prn neb tx.

## 2019-09-03 NOTE — H&P (Signed)
Patient Demographics:    Bailey Bishop, is a 79 y.o. female  MRN: [id number]   DOB - [birth year]  Admit Date - 09/03/2019  Outpatient Primary MD for the patient is [name], MD   Assessment & Plan:    Principal Problem:   Symptomatic anemia Active Problems:   Hypokalemia   Persistent atrial fibrillation ([place])   Essential hypertension   COPD (chronic obstructive pulmonary disease) (HCC)   Anticoagulated   Iron deficiency anemia   CKD (chronic kidney disease), stage IIIb    1)Symptomatic Anemia with shortness of breath and dyspnea on exertion--- hemoglobin down to 8.9 from a baseline of around 12 --Patient is on Eliquis and has hemorrhoids -Last colonoscopy 2016 with polypectomy and hemorrhoids- --monitor H&H, transfuse if becomes indicated -Hold Eliquis for now -IV Protonix as ordered given recurrent dry heaves with concerns about possible gastritis or esophagitis  2) hypokalemia--- patient down to 2.7 due to GI losses and in a  patient with persistent diarrhea and Lasix use -Magnesium WNL  3) permanent atrial fibrillation--- s/punsuccessful DCCV in 01/2017 and intolerant to Amiodarone in the past, started on Tikosyn in 11/2017 and failed therapy, intolerant to Lopressor, Toprol-XL, Coreg, Cardizem, and Cardizem CD in the past--- -restarted on amiodarone, she did not think it agreed with her, so she stopped it and she is back on verapamil for rate control -PTA was on Eliquis for stroke prophylaxis currently on hold due to anemia and presumed hemorrhoidal bleeding  4) History of chronic chronic diastolic CHF-----shortness of breath and some dyspnea on exertion, BNP is elevated, but consistent with prior levels, chest x-ray without overt failure, treat empirically with iv Lasix, PTA she was also on po  Lasix  5) CKD stage - IIIb -Creatinine is 1.43 which is close to patient's baseline   --- renally adjust medications, avoid nephrotoxic agents / dehydration  / hypotension  Disposition/Need for in-Hospital Stay- patient unable to be discharged at this time due to --- concerns about GI bleed, symptomatic anemia and severe hypokalemia  Dispo: The patient is from: Home              Anticipated d/c is to: Home              Anticipated d/c date is: 1 day              Patient currently is not medically stable to d/c.   Barriers: Not Clinically Stable- -- concerns about GI bleed, symptomatic anemia and severe hypokalemia   With History of - Reviewed by me  Past Medical History:  Diagnosis Date  . Anxiety   . Arthritis   . Asthma   . Atrial fibrillation ([place])    a. s/p unsuccessful DCCV in 01/2017 and intolerant to Amiodarone --> Rate-control strategy pursued since b. failed Tikosyn in 11/2017  . Cancer [facility]) 2001   right breast  . Dyspnea   . GERD (gastroesophageal reflux disease)   .  Hypertension   . T10 vertebral fracture [facility])       Past Surgical History:  Procedure Laterality Date  . BIOPSY N/A 04/22/2018   Procedure: BIOPSY;  Surgeon: Aviva Signs, MD;  Location: [facility]O [facility];  Service: Gastroenterology;  Laterality: N/A;  . CARDIOVERSION N/A 02/18/2017   Procedure: CARDIOVERSION;  Surgeon: Josue Hector, MD;  Location: [facility] ENDOSCOPY;  Service: Cardiovascular;  Laterality: N/A;  . CARDIOVERSION N/A 12/04/2017   Procedure: CARDIOVERSION;  Surgeon: [name], MD;  Location: [place];  Service: Cardiovascular;  Laterality: N/A;  . CATARACT EXTRACTION W/PHACO  07/23/2011   Procedure: CATARACT EXTRACTION PHACO AND INTRAOCULAR LENS PLACEMENT ([place]);  Surgeon: Williams Che, MD;  Location: [facility] ORS;  Service: Ophthalmology;  Laterality: Right;  CDE:9.96  . CATARACT EXTRACTION W/PHACO Left 11/13/2018   Procedure: CATARACT EXTRACTION PHACO AND INTRAOCULAR LENS PLACEMENT  (IOC);  Surgeon: Baruch Goldmann, MD;  Location: [facility] ORS;  Service: Ophthalmology;  Laterality: Left;  left, CDE: 7.44  . CHOLECYSTECTOMY N/A 02/14/2015   Procedure: LAPAROSCOPIC CHOLECYSTECTOMY;  Surgeon: Aviva Signs, MD;  Location: [facility] ORS;  Service: General;  Laterality: N/A;  . COLONOSCOPY N/A 04/22/2018   Procedure: COLONOSCOPY;  Surgeon: Aviva Signs, MD;  [facility];  Service: Gastroenterology;  Laterality: N/A;  . ESOPHAGOGASTRODUODENOSCOPY N/A 04/22/2018   Procedure: ESOPHAGOGASTRODUODENOSCOPY (EGD);  Surgeon: Aviva Signs, MD;  [facility];  Service: Gastroenterology;  Laterality: N/A;  . MASTECTOMY     bilateral mastectomy-right breast cancer-left taken by choice  . POLYPECTOMY  04/22/2018   Procedure: POLYPECTOMY;  Surgeon: Aviva Signs, MD;  Location[facility];  Service: Gastroenterology;;  . [place]      Chief Complaint  Patient presents with  . Shortness of Breath      HPI:    Bailey Bishop  is a 79 y.o. female  with past medical history of persistent atrial fibrillation (s/punsuccessful DCCV in 01/2017 and intolerant to Amiodarone in the past, started on Tikosyn in 11/2017 and failed therapy, intolerant to Lopressor, Toprol-XL, Coreg, Cardizem, and Cardizem CD in the past),HTN, chronic diastolic CHF, and Stage 3 CKD  presents to the ED with shortness of breath, nausea dry heaving and diarrhea since Saturday 08/28/19--- --- No fever  Or chills  --Chest pains palpitations or dizziness -Recently switched back and forth between amiodarone and verapamil  -Additional history obtained from patient's husband at bedside -She has hemorrhoids and with diarrhea over the last week she has noticed some blood presumably from hemorrhoids  --Over the last couple days she has been a bit more short of breath -Denies pleuritic symptoms - patient reports compliance with Eliquis --ED she is found to have a potassium of 2.7 magnesium is 2.2 with a  creatinine of 1.43, which is close to her baseline -Troponin is not elevated repeated x2 -BNP is 715 which is actually lower than patient's previous levels -Chest x-ray with emphysema otherwise no acute findings specifically no edema -CBC with the hemoglobin of 8.9 down from a baseline usually around 12, MCV, MCH and RDW are WNL   Review of systems:    In addition to the HPI above,   A full Review of  Systems was done, all other systems reviewed are negative except as noted above in HPI , .    Social History:  Reviewed by me    Social History   Tobacco Use  . Smoking status: Former Smoker    Packs/day: 1.00    Years: 28.00  Pack years: 28.00    Types: Cigarettes    Quit date: 07/17/1983    Years since quitting: 36.1  . Smokeless tobacco: Never Used  Substance Use Topics  . Alcohol use: No       Family History :  Reviewed by me    Family History  Problem Relation Age of Onset  . Cancer Mother   . Aneurysm Father   . Cancer Sister   . Cancer Sister   . Anesthesia problems Neg Hx   . Hypotension Neg Hx   . Malignant hyperthermia Neg Hx   . Pseudochol deficiency Neg Hx   . Colon cancer Neg Hx      Home Medications:   Prior to Admission medications   Medication Sig Start Date End Date Taking? Authorizing Provider  acetaminophen (TYLENOL) 650 MG CR tablet Take 650 mg by mouth every 8 (eight) hours as needed for pain.   Yes [provider]  albuterol (VENTOLIN HFA) 108 (90 Base) MCG/ACT inhaler Inhale 1-2 puffs into the lungs every 6 (six) hours as needed for wheezing or shortness of breath.   Yes [provider]  ALPRAZolam Duanne Moron) 0.5 MG tablet Take 0.5 mg by mouth at bedtime as needed for anxiety.  06/18/14  Yes [provider]  apixaban (ELIQUIS) 5 MG TABS tablet Take 1 tablet (5 mg total) by mouth 2 (two) times daily. 08/17/19  Yes Sherran Needs, NP  calcium citrate-vitamin D (CITRACAL+D) 315-200 MG-UNIT tablet Take 1 tablet by  mouth 2 (two) times daily.   Yes [provider]  fluticasone (FLONASE) 50 MCG/ACT nasal spray Place 1 spray into both nostrils as needed.  12/26/18  Yes [provider]  furosemide (LASIX) 40 MG tablet Take 1 tablet (40 mg total) by mouth daily. 08/17/19  Yes Sherran Needs, NP  omeprazole (PRILOSEC) 20 MG capsule Take 20 mg by mouth daily as needed (for indigestion/stomach issues.).  02/14/16  Yes [provider]  potassium chloride SA (KLOR-CON) 20 MEQ tablet Take 1 tablet (20 mEq total) by mouth daily. 04/30/19  Yes Sherran Needs, NP  traZODone (DESYREL) 50 MG tablet Take 50 mg by mouth at bedtime as needed. 08/19/19  Yes [provider]  verapamil (CALAN) 40 MG tablet Take 1 tablet (40 mg total) by mouth 3 (three) times daily. Patient taking differently: Take 40 mg by mouth daily.  09/02/19  Yes Sherran Needs, NP  cyclobenzaprine (FLEXERIL) 5 MG tablet Take 1 tablet (5 mg total) by mouth 3 (three) times daily as needed for muscle spasms. Patient not taking: Reported on 09/03/2019 06/05/19   Lucrezia Starch, MD  docusate sodium (COLACE) 100 MG capsule Take 1 capsule (100 mg total) by mouth every 12 (twelve) hours. Patient not taking: Reported on 09/03/2019 06/06/19   Margarita Mail, PA-C  Lidocaine 4 % PTCH Apply 1 patch topically 2 (two) times daily. Patient not taking: Reported on 09/03/2019 06/09/19   Varney Biles, MD  oxyCODONE (ROXICODONE) 5 MG immediate release tablet Take 0.5-1 tablets (2.5-5 mg total) by mouth every 6 (six) hours as needed for severe pain. Patient not taking: Reported on 09/03/2019 06/06/19   Margarita Mail, PA-C  oxyCODONE-acetaminophen (PERCOCET/ROXICET) 5-325 MG tablet Take 2 tablets by mouth every 4 (four) hours as needed for severe pain. Patient not taking: Reported on 09/03/2019 06/06/19   Margarita Mail, PA-C     Allergies:     Allergies  Allergen Reactions  . Cardizem Cd [Diltiazem Hcl Er Beads]  Diarrhea  . Amiodarone  Nausea And Vomiting  . Metoprolol Tartrate Diarrhea  . Sulfa Antibiotics Nausea And Vomiting  . Tomato Other (See Comments)    Burns stomach  . Carvedilol Diarrhea and Rash  . Chocolate Rash and Other (See Comments)    Dark chocolate     Physical Exam:   Vitals  Blood pressure 132/77, pulse 82, temperature 99 F (37.2 C), temperature source Oral, resp. rate 19, height 5' 4.02" (1.626 m), weight 65.1 kg, SpO2 100 %.  Physical Examination: General appearance - alert, well appearing, and in no distress Mental status - alert, oriented to person, place, and time,  Eyes - sclera anicteric Neck - supple, no JVD elevation , Chest - clear  to auscultation bilaterally, symmetrical air movement,  Heart - S1 and S2 normal, irregularly irregular Abdomen - soft, nontender, nondistended, no masses or organomegaly Neurological - screening mental status exam normal, neck supple without rigidity, cranial nerves II through XII intact, DTR's normal and symmetric Extremities - no pedal edema noted, intact peripheral pulses  Skin - warm, dry     Data Review:    CBC Recent Labs  Lab 09/03/19 0955  WBC 6.7  HGB 8.9*  HCT 28.1*  PLT 229  MCV 90.1  MCH 28.5  MCHC 31.7  RDW 14.6  LYMPHSABS 0.8  MONOABS 0.7  EOSABS 0.3  BASOSABS 0.0   ------------------------------------------------------------------------------------------------------------------  Chemistries  Recent Labs  Lab 09/03/19 0955 09/03/19 1206  NA 136  --   K 2.7*  --   CL 97*  --   CO2 27  --   GLUCOSE 100*  --   BUN 11  --   CREATININE 1.43*  --   CALCIUM 8.6*  --   MG  --  2.2  AST 22  --   ALT 10  --   ALKPHOS 123  --   BILITOT 1.4*  --    ------------------------------------------------------------------------------------------------------------------ estimated creatinine clearance is 27.1 mL/min (A) (by C-G formula based on SCr of 1.43 mg/dL  (H)). ------------------------------------------------------------------------------------------------------------------ No results for input(s): TSH, T4TOTAL, T3FREE, THYROIDAB in the last 72 hours.  Invalid input(s): FREET3   Coagulation profile No results for input(s): INR, PROTIME in the last 168 hours. ------------------------------------------------------------------------------------------------------------------- No results for input(s): DDIMER in the last 72 hours. -------------------------------------------------------------------------------------------------------------------  Cardiac Enzymes No results for input(s): CKMB, TROPONINI, MYOGLOBIN in the last 168 hours.  Invalid input(s): CK ------------------------------------------------------------------------------------------------------------------    Component Value Date/Time   BNP 715.0 (H) 09/03/2019 0955     ---------------------------------------------------------------------------------------------------------------  Urinalysis    Component Value Date/Time   COLORURINE STRAW (A) 05/23/2019 0914   APPEARANCEUR CLEAR 05/23/2019 0914   LABSPEC 1.004 (L) 05/23/2019 0914   PHURINE 7.0 05/23/2019 0914   GLUCOSEU NEGATIVE 05/23/2019 0914   HGBUR NEGATIVE 05/23/2019 0914   BILIRUBINUR NEGATIVE 05/23/2019 0914   KETONESUR NEGATIVE 05/23/2019 0914   PROTEINUR NEGATIVE 05/23/2019 0914   NITRITE NEGATIVE 05/23/2019 0914   LEUKOCYTESUR NEGATIVE 05/23/2019 0914    ----------------------------------------------------------------------------------------------------------------   Imaging Results:    DG Chest Portable 1 View  Result Date: 09/03/2019 CLINICAL DATA:  Increasing shortness of breath over the past 7 days. EXAM: PORTABLE CHEST 1 VIEW COMPARISON:  PA and lateral chest 03/29/2017. FINDINGS: There is cardiomegaly without edema. Lungs are emphysematous. No consolidative process, pneumothorax or effusion.  Atherosclerosis noted. IMPRESSION: No acute disease. Cardiomegaly. Aortic Atherosclerosis (ICD10-I70.0) and Emphysema (ICD10-J43.9). Electronically Signed   By: Inge Rise M.D.   On: 09/03/2019 10:07    Radiological  Exams on Admission: DG Chest Portable 1 View  Result Date: 09/03/2019 CLINICAL DATA:  Increasing shortness of breath over the past 7 days. EXAM: PORTABLE CHEST 1 VIEW COMPARISON:  PA and lateral chest 03/29/2017. FINDINGS: There is cardiomegaly without edema. Lungs are emphysematous. No consolidative process, pneumothorax or effusion. Atherosclerosis noted. IMPRESSION: No acute disease. Cardiomegaly. Aortic Atherosclerosis (ICD10-I70.0) and Emphysema (ICD10-J43.9). Electronically Signed   By: Inge Rise M.D.   On: 09/03/2019 10:07    DVT Prophylaxis -SCD (Gi Bleed) AM Labs Ordered, also please review Full Orders  Family Communication: Admission, patients condition and plan of care including tests being ordered have been discussed with the patient and husband who indicate understanding and agree with the plan   Code Status - Full Code  Likely DC to  home  Condition   Stable  Roxan Hockey M.D on 09/03/2019 at 6:16 PM Go to [email or web address] -  for contact info  [facility] - Office  [phone]

## 2019-09-03 NOTE — ED Notes (Signed)
ED Provider at bedside. 

## 2019-09-04 ENCOUNTER — Encounter (HOSPITAL_COMMUNITY): Payer: Self-pay | Admitting: Anesthesiology

## 2019-09-04 DIAGNOSIS — D509 Iron deficiency anemia, unspecified: Principal | ICD-10-CM

## 2019-09-04 DIAGNOSIS — I4819 Other persistent atrial fibrillation: Secondary | ICD-10-CM

## 2019-09-04 DIAGNOSIS — J438 Other emphysema: Secondary | ICD-10-CM

## 2019-09-04 DIAGNOSIS — E876 Hypokalemia: Secondary | ICD-10-CM

## 2019-09-04 DIAGNOSIS — N183 Chronic kidney disease, stage 3 unspecified: Secondary | ICD-10-CM

## 2019-09-04 DIAGNOSIS — Z7901 Long term (current) use of anticoagulants: Secondary | ICD-10-CM

## 2019-09-04 DIAGNOSIS — I10 Essential (primary) hypertension: Secondary | ICD-10-CM

## 2019-09-04 LAB — BASIC METABOLIC PANEL
Anion gap: 10 (ref 5–15)
BUN: 11 mg/dL (ref 8–23)
CO2: 25 mmol/L (ref 22–32)
Calcium: 8.7 mg/dL — ABNORMAL LOW (ref 8.9–10.3)
Chloride: 100 mmol/L (ref 98–111)
Creatinine, Ser: 1.51 mg/dL — ABNORMAL HIGH (ref 0.44–1.00)
GFR calc Af Amer: 37 mL/min — ABNORMAL LOW (ref >60)
GFR calc non Af Amer: 32 mL/min — ABNORMAL LOW (ref >60)
Glucose, Bld: 92 mg/dL (ref 70–99)
Potassium: 4.4 mmol/L (ref 3.5–5.1)
Sodium: 135 mmol/L (ref 135–145)

## 2019-09-04 LAB — CBC
HCT: 27.3 % — ABNORMAL LOW (ref 36.0–46.0)
Hemoglobin: 8.4 g/dL — ABNORMAL LOW (ref 12.0–15.0)
MCH: 27.9 pg (ref 26.0–34.0)
MCHC: 30.8 g/dL (ref 30.0–36.0)
MCV: 90.7 fL (ref 80.0–100.0)
Platelets: 233 10*3/uL (ref 150–400)
RBC: 3.01 MIL/uL — ABNORMAL LOW (ref 3.87–5.11)
RDW: 14.6 % (ref 11.5–15.5)
WBC: 6.8 10*3/uL (ref 4.0–10.5)
nRBC: 0 % (ref 0.0–0.2)

## 2019-09-04 LAB — GLUCOSE, CAPILLARY: Glucose-Capillary: 89 mg/dL (ref 70–99)

## 2019-09-04 LAB — MRSA PCR SCREENING: MRSA by PCR: NEGATIVE

## 2019-09-04 MED ORDER — PEG 3350-KCL-NA BICARB-NACL 420 G PO SOLR: 2000.0000 mL | Freq: Once | ORAL | Status: DC

## 2019-09-04 MED ORDER — PEG 3350-KCL-NA BICARB-NACL 420 G PO SOLR
2000.0000 mL | Freq: Once | ORAL | Status: AC
  Administered 2019-09-04: 2000 mL via ORAL

## 2019-09-04 MED ORDER — SODIUM CHLORIDE 0.9 % IV SOLN: INTRAVENOUS | Status: DC

## 2019-09-04 NOTE — Progress Notes (Addendum)
Referring Provider: [facility] Primary Care Physician:  [name], MD Primary Gastroenterologist:  Dr. Gala Romney (previously unassigned)  Date of Admission: 09/03/19 Date of Consultation: 09/04/19  Reason for Consultation:  Symptomatic Anemia  HPI:  Bailey Bishop is a 79 y.o. female with a past medical history of atrial fibrillation status post failure of electrical and chemical cardioversion now on rate control, breast cancer, GERD, hypertension.  She presented to the emergency department with shortness of breath, nausea and dry heaving, and diarrhea since Saturday, 08/28/2019.  No fever or chills.  Noted hemorrhoids and with diarrhea over the last week with some blood.  She is on Eliquis anticoagulation.  Troponins were not elevated, BNP elevated but lower than baseline, chest x-ray unremarkable.  Hemoglobin found to be 8.9 down from a baseline of 12.  She was admitted for further evaluation and treatment, Eliquis placed on hold.  Noted significant hypokalemia with a potassium 2.7 which was corrected and is 4.4 today.  Colonoscopy and upper endoscopy recently completed 04/22/2018 by Dr. Aviva Signs of surgery for unexplained IDA in the setting of Coumadin anticoagulation.  Colonoscopy found a single 2 mm polyp in the distal transverse colon, a single 3 mm polyp in the distal transverse colon, diverticulosis in the sigmoid colon, otherwise normal.  Surgical pathology found the polyps to be tubular adenoma and recommended 5-year repeat colonoscopy (2025).  EGD also completed 04/22/2018 found normal esophagus, normal stomach, normal duodenum.  No specific recommendations.  Today she states she began having diarrhea when she started taking amiodarone.  She subsequently stopped taking amiodarone and her diarrhea has now improved.  This morning she had a bowel movement that was formed.  She denies any obvious hematochezia or melena, but on further questioning she admitted during frequent  diarrhea when she wipes she did have a slight pink tinge but no obvious large-volume hematochezia.  She began having some shortness of breath.  She was told by her arrhythmia clinic provider that it would take some time for the amiodarone to completely come out of her system.  Denies any abdominal pain, nausea, vomiting, hematochezia, melena.  She did have some dark stools when she took a dose Pepto-Bismol, but this resolved when she stopped.  No other overt GI complaints.  Feels shortness of breath improved this morning.  Last dose of Eliquis yesterday morning.   Past Medical History:  Diagnosis Date  . Anxiety   . Arthritis   . Asthma   . Atrial fibrillation ([place])    a. s/p unsuccessful DCCV in 01/2017 and intolerant to Amiodarone --> Rate-control strategy pursued since b. failed Tikosyn in 11/2017  . Cancer [name]) 2001   right breast  . Dyspnea   . GERD (gastroesophageal reflux disease)   . Hypertension   . T10 vertebral fracture [facility])     Past Surgical History:  Procedure Laterality Date  . BIOPSY N/A 04/22/2018   Procedure: BIOPSY;  Surgeon: Aviva Signs, MD;  [facility];  Service: Gastroenterology;  Laterality: N/A;  . CARDIOVERSION N/A 02/18/2017   Procedure: CARDIOVERSION;  Surgeon: Josue Hector, MD;  Location: [facility] ENDOSCOPY;  Service: Cardiovascular;  Laterality: N/A;  . CARDIOVERSION N/A 12/04/2017   Procedure: CARDIOVERSION;  Surgeon: [name], MD;  Location: [place];  Service: Cardiovascular;  Laterality: N/A;  . CATARACT EXTRACTION W/PHACO  07/23/2011   Procedure: CATARACT EXTRACTION PHACO AND INTRAOCULAR LENS PLACEMENT ([place]);  Surgeon: Williams Che, MD;  Location: [facility] ORS;  Service: Ophthalmology;  Laterality:  Right;  CDE:9.96  . CATARACT EXTRACTION W/PHACO Left 11/13/2018   Procedure: CATARACT EXTRACTION PHACO AND INTRAOCULAR LENS PLACEMENT (IOC);  Surgeon: Baruch Goldmann, MD;  Location: [facility] ORS;  Service: Ophthalmology;  Laterality: Left;   left, CDE: 7.44  . CHOLECYSTECTOMY N/A 02/14/2015   Procedure: LAPAROSCOPIC CHOLECYSTECTOMY;  Surgeon: Aviva Signs, MD;  Location: [facility] ORS;  Service: General;  Laterality: N/A;  . COLONOSCOPY N/A 04/22/2018   Procedure: COLONOSCOPY;  Surgeon: Aviva Signs, MD;  [facility];  Service: Gastroenterology;  Laterality: N/A;  . ESOPHAGOGASTRODUODENOSCOPY N/A 04/22/2018   Procedure: ESOPHAGOGASTRODUODENOSCOPY (EGD);  Surgeon: Aviva Signs, MD;  [facility];  Service: Gastroenterology;  Laterality: N/A;  . MASTECTOMY     bilateral mastectomy-right breast cancer-left taken by choice  . POLYPECTOMY  04/22/2018   Procedure: POLYPECTOMY;  Surgeon: Aviva Signs, MD;  [facility];  Service: Gastroenterology;;  . Valarie Merino  1995    Prior to Admission medications   Medication Sig Start Date End Date Taking? Authorizing Provider  acetaminophen (TYLENOL) 650 MG CR tablet Take 650 mg by mouth every 8 (eight) hours as needed for pain.   Yes [provider]  albuterol (VENTOLIN HFA) 108 (90 Base) MCG/ACT inhaler Inhale 1-2 puffs into the lungs every 6 (six) hours as needed for wheezing or shortness of breath.   Yes [provider]  ALPRAZolam Duanne Moron) 0.5 MG tablet Take 0.5 mg by mouth at bedtime as needed for anxiety.  06/18/14  Yes [provider]  apixaban (ELIQUIS) 5 MG TABS tablet Take 1 tablet (5 mg total) by mouth 2 (two) times daily. 08/17/19  Yes Sherran Needs, NP  calcium citrate-vitamin D (CITRACAL+D) 315-200 MG-UNIT tablet Take 1 tablet by mouth 2 (two) times daily.   Yes [provider]  fluticasone (FLONASE) 50 MCG/ACT nasal spray Place 1 spray into both nostrils as needed.  12/26/18  Yes [provider]  furosemide (LASIX) 40 MG tablet Take 1 tablet (40 mg total) by mouth daily. 08/17/19  Yes Sherran Needs, NP  omeprazole (PRILOSEC) 20 MG capsule Take 20 mg by mouth daily as needed (for indigestion/stomach issues.).   02/14/16  Yes [provider]  potassium chloride SA (KLOR-CON) 20 MEQ tablet Take 1 tablet (20 mEq total) by mouth daily. 04/30/19  Yes Sherran Needs, NP  traZODone (DESYREL) 50 MG tablet Take 50 mg by mouth at bedtime as needed. 08/19/19  Yes [provider]  verapamil (CALAN) 40 MG tablet Take 1 tablet (40 mg total) by mouth 3 (three) times daily. Patient taking differently: Take 40 mg by mouth daily.  09/02/19  Yes Sherran Needs, NP  cyclobenzaprine (FLEXERIL) 5 MG tablet Take 1 tablet (5 mg total) by mouth 3 (three) times daily as needed for muscle spasms. Patient not taking: Reported on 09/03/2019 06/05/19   Lucrezia Starch, MD  docusate sodium (COLACE) 100 MG capsule Take 1 capsule (100 mg total) by mouth every 12 (twelve) hours. Patient not taking: Reported on 09/03/2019 06/06/19   Margarita Mail, PA-C  Lidocaine 4 % PTCH Apply 1 patch topically 2 (two) times daily. Patient not taking: Reported on 09/03/2019 06/09/19   Varney Biles, MD  oxyCODONE (ROXICODONE) 5 MG immediate release tablet Take 0.5-1 tablets (2.5-5 mg total) by mouth every 6 (six) hours as needed for severe pain. Patient not taking: Reported on 09/03/2019 06/06/19   Margarita Mail, PA-C  oxyCODONE-acetaminophen (PERCOCET/ROXICET) 5-325 MG tablet Take 2 tablets by mouth every 4 (four)  hours as needed for severe pain. Patient not taking: Reported on 09/03/2019 06/06/19   Margarita Mail, PA-C    Current Facility-Administered Medications  Medication Dose Route Frequency Provider Last Rate Last Admin  . 0.9 %  sodium chloride infusion  250 mL Intravenous PRN Emokpae, Courage, MD      . 0.9 %  sodium chloride infusion   Intravenous Continuous Denton Brick, Courage, MD 50 mL/hr at 09/04/19 0308 Rate Verify at 09/04/19 0308  . acetaminophen (TYLENOL) tablet 650 mg  650 mg Oral Q6H PRN Emokpae, Courage, MD      . albuterol (PROVENTIL) (2.5 MG/3ML) 0.083% nebulizer solution 2.5 mg  2.5 mg Nebulization Q2H PRN Emokpae,  Courage, MD      . albuterol (VENTOLIN HFA) 108 (90 Base) MCG/ACT inhaler 1-2 puff  1-2 puff Inhalation Q6H PRN Roxan Hockey, MD   2 puff at 09/03/19 1908  . ALPRAZolam (XANAX) tablet 0.5 mg  0.5 mg Oral QHS PRN Emokpae, Courage, MD      . calcium-vitamin D (OSCAL WITH D) 500-200 MG-UNIT per tablet 1 tablet  1 tablet Oral BID Roxan Hockey, MD   1 tablet at 09/03/19 2021  . fentaNYL (SUBLIMAZE) injection 12.5-50 mcg  12.5-50 mcg Intravenous Q2H PRN Emokpae, Courage, MD      . furosemide (LASIX) injection 20 mg  20 mg Intravenous BID Denton Brick, Courage, MD   20 mg at 09/04/19 0824  . ondansetron (ZOFRAN) tablet 4 mg  4 mg Oral Q6H PRN Emokpae, Courage, MD       Or  . ondansetron (ZOFRAN) injection 4 mg  4 mg Intravenous Q6H PRN Emokpae, Courage, MD      . oxyCODONE (Oxy IR/ROXICODONE) immediate release tablet 5 mg  5 mg Oral Q4H PRN Emokpae, Courage, MD      . pantoprazole (PROTONIX) injection 40 mg  40 mg Intravenous Q12H Emokpae, Courage, MD   40 mg at 09/04/19 0823  . polyethylene glycol (MIRALAX / GLYCOLAX) packet 17 g  17 g Oral Daily PRN Emokpae, Courage, MD      . potassium chloride SA (KLOR-CON) CR tablet 20 mEq  20 mEq Oral Daily Emokpae, Courage, MD      . sodium chloride flush (NS) 0.9 % injection 3 mL  3 mL Intravenous Q12H Emokpae, Courage, MD   3 mL at 09/03/19 2021  . sodium chloride flush (NS) 0.9 % injection 3 mL  3 mL Intravenous PRN Emokpae, Courage, MD      . traZODone (DESYREL) tablet 50 mg  50 mg Oral QHS Emokpae, Courage, MD   50 mg at 09/03/19 2022  . verapamil (CALAN) tablet 40 mg  40 mg Oral TID Roxan Hockey, MD   40 mg at 09/03/19 2018    Allergies as of 09/03/2019 - Review Complete 09/03/2019  Allergen Reaction Noted  . Cardizem cd [diltiazem hcl er beads] Diarrhea 12/06/2016  . Amiodarone Nausea And Vomiting 02/07/2017  . Metoprolol tartrate Diarrhea 06/05/2017  . Sulfa antibiotics Nausea And Vomiting 09/23/2016  . Tomato Other (See Comments) 12/02/2017   . Carvedilol Diarrhea and Rash 01/01/2017  . Chocolate Rash and Other (See Comments) 02/09/2015    Family History  Problem Relation Age of Onset  . Cancer Mother   . Aneurysm Father   . Cancer Sister   . Cancer Sister   . Anesthesia problems Neg Hx   . Hypotension Neg Hx   . Malignant hyperthermia Neg Hx   . Pseudochol deficiency Neg Hx   . Colon cancer  Neg Hx     Social History   Socioeconomic History  . Marital status: Married    Spouse name: [name]  . Number of children: Not on file  . Years of education: Not on file  . Highest education level: Not on file  Occupational History  . Not on file  Tobacco Use  . Smoking status: Former Smoker    Packs/day: 1.00    Years: 28.00    Pack years: 28.00    Types: Cigarettes    Quit date: 07/17/1983    Years since quitting: 36.1  . Smokeless tobacco: Never Used  Vaping Use  . Vaping Use: Never used  Substance and Sexual Activity  . Alcohol use: No  . Drug use: No  . Sexual activity: Never    Birth control/protection: Post-menopausal  Other Topics Concern  . Not on file  Social History Narrative  . Not on file   Social Determinants of Health   Financial Resource Strain:   . Difficulty of Paying Living Expenses:   Food Insecurity:   . Worried About Charity fundraiser in the Last Year:   . Arboriculturist in the Last Year:   Transportation Needs: No Transportation Needs  . Lack of Transportation (Medical): No  . Lack of Transportation (Non-Medical): No  Physical Activity:   . Days of Exercise per Week:   . Minutes of Exercise per Session:   Stress:   . Feeling of Stress :   Social Connections: Unknown  . Frequency of Communication with Friends and Family: Not on file  . Frequency of Social Gatherings with Friends and Family: Not on file  . Attends Religious Services: Not on file  . Active Member of Clubs or Organizations: Not on file  . Attends Archivist Meetings: Not on file  . Marital Status:  Married  Human resources officer Violence:   . Fear of Current or Ex-Partner:   . Emotionally Abused:   Marland Kitchen Physically Abused:   . Sexually Abused:     Review of Systems: General: Negative for anorexia, weight loss, fever, chills, fatigue, weakness. ENT: Negative for hoarseness, difficulty swallowing. CV: Negative for chest pain, angina, palpitations, peripheral edema.  Respiratory: Negative for dyspnea at rest, cough, sputum, wheezing.  GI: See history of present illness. MS: Negative for joint pain, low back pain.  Endo: Negative for unusual weight change.  Heme: Negative for bruising or bleeding. Allergy: Negative for rash or hives.  Physical Exam: Vital signs in last 24 hours: Temp:  [98 F (36.7 C)-99.2 F (37.3 C)] 99 F (37.2 C) (06/11 0525) Pulse Rate:  [72-117] 97 (06/11 0525) Resp:  [16-29] 16 (06/11 0525) BP: (97-135)/(51-78) 103/75 (06/11 0525) SpO2:  [94 %-100 %] 95 % (06/11 0525) Weight:  [62.1 kg-65.1 kg] 65.1 kg (06/10 1658) Last BM Date: 09/03/19 General:   Alert,  Well-developed, well-nourished, pleasant and cooperative in NAD Head:  Normocephalic and atraumatic. Eyes:  Sclera clear, no icterus. Conjunctiva pink. Ears:  Normal auditory acuity. Neck:  Supple; no masses or thyromegaly. Lungs:  Clear throughout to auscultation.  No wheezes, crackles, or rhonchi. No acute distress. Heart:  Regular rate and rhythm; no murmurs, clicks, rubs,  or gallops. Abdomen:  Soft, nontender and nondistended. No masses, hepatosplenomegaly or hernias noted. Normal bowel sounds, without guarding, and without rebound.   Rectal:  Deferred.   Msk:  Symmetrical without gross deformities. Pulses:  Normal bilateral DP pulses noted. Extremities:  Without clubbing or edema. Neurologic:  Alert and  oriented x4;  grossly normal neurologically. Psych:  Alert and cooperative. Normal mood and affect.  Intake/Output from previous day: 06/10 0701 - 06/11 0700 In: 772.6 [P.O.:200; I.V.:372.6; IV  Piggyback:200] Out: -  Intake/Output this shift: No intake/output data recorded.  Lab Results: Recent Labs    09/03/19 0955 09/04/19 0754  WBC 6.7 6.8  HGB 8.9* 8.4*  HCT 28.1* 27.3*  PLT 229 233   BMET Recent Labs    09/03/19 0955  NA 136  K 2.7*  CL 97*  CO2 27  GLUCOSE 100*  BUN 11  CREATININE 1.43*  CALCIUM 8.6*   LFT Recent Labs    09/03/19 0955  PROT 7.3  ALBUMIN 3.5  AST 22  ALT 10  ALKPHOS 123  BILITOT 1.4*   PT/INR No results for input(s): LABPROT, INR in the last 72 hours. Hepatitis Panel No results for input(s): HEPBSAG, HCVAB, HEPAIGM, HEPBIGM in the last 72 hours. C-Diff No results for input(s): CDIFFTOX in the last 72 hours.  Studies/Results: DG Chest Portable 1 View  Result Date: 09/03/2019 CLINICAL DATA:  Increasing shortness of breath over the past 7 days. EXAM: PORTABLE CHEST 1 VIEW COMPARISON:  PA and lateral chest 03/29/2017. FINDINGS: There is cardiomegaly without edema. Lungs are emphysematous. No consolidative process, pneumothorax or effusion. Atherosclerosis noted. IMPRESSION: No acute disease. Cardiomegaly. Aortic Atherosclerosis (ICD10-I70.0) and Emphysema (ICD10-J43.9). Electronically Signed   By: Inge Rise M.D.   On: 09/03/2019 10:07    Impression: Pleasant 79 year old female with a significant cardiac history as outlined above presented with worsening dyspnea.  She was found to be anemic with a decline in hemoglobin from a baseline in the 11 range down to 8.9.  It decreased further today to 8.4.  She is on chronic anticoagulation related to her atrial fibrillation.  Recent colonoscopy and endoscopy as outlined above with 2 tubular adenomas and no significant endoscopic findings otherwise.  She was admitted for further evaluation and work-up of acute on chronic anemia in the setting of chronic anticoagulation.  Anemia -Baseline hemoglobin over the past 3 to 4 months appears to be 12.6-12.9 with normal indices.  3 months ago  with declined 11.2.  Yesterday on presentation in the ER was 8.9 and then this morning with a mild drift to 8.4, query some element of hydration effect.  Denies overt hematochezia or melena.  Some pink tinge on the toilet tissue with significant diarrhea, which is since resolved.  Feels her shortness of breath is somewhat improved today.  Formed stool this morning with no blood or melena.  Plan: 1. Monitor hgb 2. Transfuse as necessary 3. Continue to hold Eliquis 4. Colonoscopy with Dr. Gala Romney tomorrow morning (48 hours after last Eliquis) 5. Clear liquids today 6. NPO after midnight 7. Split prep this afternoon/tomorrow early morning 8. Tap water enemas x 2 in the morning 9. Monitor for recurrent diarrhea 10. Supportive measures    ADDENDUM: Discussed with Dr. Gala Romney. We will offer the patient a colonoscopy tomorrow morning on propofol/MAC for acute symptomatic anemia. Plan updated   Thank you for allowing [place] to participate in the care of [place], DNP, AGNP-C Adult & Gerontological Nurse Practitioner [facility]    LOS: 0 days     09/04/2019, 8:27 AM

## 2019-09-04 NOTE — Anesthesia Preprocedure Evaluation (Deleted)
Anesthesia Evaluation  Patient identified by MRN, date of birth, ID band Patient awake    Reviewed: Allergy & Precautions, NPO status , Patient's Chart, lab work & pertinent test results  History of Anesthesia Complications Negative for: history of anesthetic complications  Airway Mallampati: II  TM Distance: >3 FB Neck ROM: Full    Dental  (+) Edentulous Upper, Edentulous Lower   Pulmonary shortness of breath and with exertion, asthma , COPD, former smoker,    Pulmonary exam normal breath sounds clear to auscultation       Cardiovascular Exercise Tolerance: Poor hypertension, Pt. on medications + dysrhythmias Atrial Fibrillation  Rhythm:Irregular Rate:Tachycardia  03-Sep-2019 09:08:10 [facility] ROUTINE RECORD Atrial fibrillation Paired ventricular premature complexes Left axis deviation Anterior infarct, old Artifact in lead(s) I II III aVR aVL aVF V1 V2 V4 V5 Confirmed by Virgel Manifold [phone]) on 09/03/2019 9:35:52 AM Left ventricle: The cavity size was normal. Wall thickness was normal. Systolic function was normal. The estimated ejection fraction was in the range of 55% to 60%. Wall motion was normal;  there were no regional wall motion abnormalities.  - Aortic valve: Mildly to moderately calcified annulus. Mildly  thickened leaflets. Valve area (VTI): 2.92 cm^2. Valve area  (Vmax): 2.73 cm^2. Valve area (Vmean): 2.61 cm^2.  - Mitral valve: There was moderate regurgitation.  - Left atrium: The atrium was severely dilated.  - Right atrium: The atrium was severely dilated.  - Atrial septum: No defect or patent foramen ovale was identified.  - Pulmonary arteries: Systolic pressure was moderately increased.  PA peak pressure: 49 mm Hg (S).  - Pericardium, extracardiac: There is a small circumferential pericardial effusion    Neuro/Psych Anxiety    GI/Hepatic Bowel prep,GERD  Medicated,   Endo/Other    Renal/GU Renal InsufficiencyRenal disease     Musculoskeletal  (+) Arthritis ,   Abdominal   Peds  Hematology  (+) anemia ,   Anesthesia Other Findings   Reproductive/Obstetrics                            Anesthesia Physical Anesthesia Plan  ASA: IV  Anesthesia Plan: General   Post-op Pain Management:    Induction: Intravenous  PONV Risk Score and Plan: 2 and TIVA  Airway Management Planned: Nasal Cannula, Natural Airway and Simple Face Mask  Additional Equipment:   Intra-op Plan:   Post-operative Plan:   Informed Consent: I have reviewed the patients History and Physical, chart, labs and discussed the procedure including the risks, benefits and alternatives for the proposed anesthesia with the patient or authorized representative who has indicated his/her understanding and acceptance.       Plan Discussed with: Surgeon  Anesthesia Plan Comments:        Anesthesia Quick Evaluation

## 2019-09-04 NOTE — Progress Notes (Signed)
Patient Demographics:    Bailey Bishop, is a 79 y.o. female, DOB - 1940/11/19, VPX:[id number]  Admit date - 09/03/2019   Admitting Physician Kinnick Maus Denton Brick, MD  Outpatient Primary MD for the patient is [name], MD  LOS - 0   Chief Complaint  Patient presents with  . Shortness of Breath        Subjective:    [name] today has no fevers, no emesis,  No chest pain,   --Having loose stools   Assessment  & Plan :    Principal Problem:   Symptomatic anemia Active Problems:   Hypokalemia   Persistent atrial fibrillation (HCC)   Essential hypertension   COPD (chronic obstructive pulmonary disease) (HCC)   Anticoagulated   Iron deficiency anemia   CKD (chronic kidney disease), stage IIIb   Brief Summary:- 79 y.o. female with past medical history ofprior breast cancer, HTN, GERD, as well as history of persistent atrial fibrillation (s/punsuccessful DCCV in 01/2017 and intolerant to Amiodarone in the past, started on Tikosyn in 11/2017 and failed therapy, intolerant to Lopressor, Toprol-XL, Coreg, Cardizem, and Cardizem CD in the past),HTN, chronic diastolic CHF, and Stage 3 CKD presents to the ED with shortness of breath, nausea dry heaving and diarrhea since Saturday 08/28/19--- admitted on 09/03/2019 with symptomatic anemia and over 3 g drop in hemoglobin while on Eliquis for A. fib   A/p 1)Symptomatic Anemia with shortness of breath and dyspnea on exertion--- hemoglobin down to 8.4 from a baseline of around 12 --Patient is on Eliquis and has h/o hemorrhoids -Last colonoscopy 2016 with polypectomy and hemorrhoids- --monitor H&H, transfuse if becomes indicated -Hold Eliquis for now (last dose am of 09/03/19) -c/n IV Protonix as ordered given recurrent dry heaves with concerns about possible gastritis or esophagitis - admitted on 09/03/2019 with symptomatic anemia and over 3 g drop  in hemoglobin while on Eliquis for A. Fib -Plan is for colonoscopy on 09/05/2019 after Eliquis washout period  2)Hypokalemia--- potassium is up to 4.4 from 2.7 after replacement  -Hypokalemia is due to GI losses and in a  patient with persistent diarrhea and Lasix use -Magnesium WNL  3) permanent atrial fibrillation--- s/punsuccessful DCCV in 01/2017 and intolerant to Amiodarone in the past, started on Tikosyn in 11/2017 and failed therapy, intolerant to Lopressor, Toprol-XL, Coreg, Cardizem, and Cardizem CD in the past--- -restarted on amiodarone, she did not think it agreed with her, so she stopped it and she is back on verapamil for rate control -PTA was on Eliquis for stroke prophylaxis currently on hold due to anemia and presumed hemorrhoidal bleeding -Eliquis on hold since a.m. of 09/03/2019  4) History of chronic chronic diastolic CHF-----shortness of breath and some dyspnea on exertion, BNP is elevated, but consistent with prior levels, chest x-ray without overt failure, treat empirically with iv Lasix, PTA she was also on po Lasix -Recorded weight is inaccurate fluctuating between 137 and 143 pounds  5) CKD stage - IIIb -Creatinine is up to 1.51 from 1.43 on admission, still close to patient's baseline   --- renally adjust medications, avoid nephrotoxic agents / dehydration  / hypotension  Disposition/Need for in-Hospital Stay- patient unable to be discharged at this time due to --- concerns about GI bleed, symptomatic  anemia and severe hypokalemia --Plan is for colonoscopy on 09/05/2019 after Eliquis washout period  Dispo: The patient is from: Home  Anticipated d/c is to: Home  Anticipated d/c date is: 2 day  Patient currently is not medically stable to d/c.  Code Status : full code  Family Communication:   (patient is alert, awake and coherent)  -With husband  Consults  :  Gi  DVT Prophylaxis  :    - SCDs--Eliquis on hold due to GI  bleed  Lab Results  Component Value Date   PLT 233 09/04/2019    Inpatient Medications  Scheduled Meds: . calcium-vitamin D  1 tablet Oral BID  . furosemide  20 mg Intravenous BID  . pantoprazole (PROTONIX) IV  40 mg Intravenous Q12H  . [START ON 09/05/2019] polyethylene glycol-electrolytes  2,000 mL Oral Once  . potassium chloride SA  20 mEq Oral Daily  . sodium chloride flush  3 mL Intravenous Q12H  . traZODone  50 mg Oral QHS  . verapamil  40 mg Oral TID   Continuous Infusions: . sodium chloride    . sodium chloride 50 mL/hr at 09/04/19 0900  . sodium chloride 20 mL/hr at 09/04/19 1353   PRN Meds:.sodium chloride, acetaminophen, albuterol, albuterol, ALPRAZolam, fentaNYL (SUBLIMAZE) injection, ondansetron **OR** ondansetron (ZOFRAN) IV, oxyCODONE, polyethylene glycol, sodium chloride flush    Anti-infectives (From admission, onward)   None        Objective:   Vitals:   09/04/19 0525 09/04/19 0824 09/04/19 1349 09/04/19 1750  BP: 103/75 117/76 117/74 125/70  Pulse: 97  95   Resp: 16  17   Temp: 99 F (37.2 C)  98 F (36.7 C)   TempSrc: Oral  Oral   SpO2: 95%  96%   Weight:      Height:        Wt Readings from Last 3 Encounters:  09/03/19 65.1 kg  07/14/19 62.1 kg  06/09/19 61.7 kg     Intake/Output Summary (Last 24 hours) at 09/04/2019 1907 Last data filed at 09/04/2019 0308 Gross per 24 hour  Intake 572.61 ml  Output --  Net 572.61 ml    Physical Exam  Gen:- Awake Alert,  In no apparent distress  HEENT:- [place].AT, No sclera icterus Neck-Supple Neck,No JVD,.  Lungs-  CTAB , fair symmetrical air movement CV- S1, S2 normal, irregularly irregular Abd-  +ve B.Sounds, Abd Soft, No tenderness,    Extremity/Skin:- No  edema, pedal pulses present  Psych-affect is appropriate, oriented x3 Neuro-no new focal deficits, no tremors   Data Review:   Micro Results Recent Results (from the past 240 hour(s))  SARS CORONAVIRUS 2 (TAT 6-24 HRS) Nasopharyngeal  Nasopharyngeal Swab     Status: None   Collection Time: 09/03/19  9:31 AM   Specimen: Nasopharyngeal Swab  Result Value Ref Range Status   SARS Coronavirus 2 NEGATIVE NEGATIVE Final    Comment: (NOTE) SARS-CoV-2 target nucleic acids are NOT DETECTED.  The SARS-CoV-2 RNA is generally detectable in upper and lower respiratory specimens during the acute phase of infection. Negative results do not preclude SARS-CoV-2 infection, do not rule out co-infections with other pathogens, and should not be used as the sole basis for treatment or other patient management decisions. Negative results must be combined with clinical observations, patient history, and epidemiological information. The expected result is Negative.  Fact Sheet for Patients: SugarRoll.be  Fact Sheet for Healthcare Providers: https://www.woods-mathews.com/  This test is not yet approved or cleared by the  [place] Architectural [employer] and  has been authorized for detection and/or diagnosis of SARS-CoV-2 by FDA under an Print production planner (EUA). This EUA will remain  in effect (meaning this test can be used) for the duration of the COVID-19 declaration under Se ction 564(b)(1) of the Act, 21 U.S.C. section 360bbb-3(b)(1), unless the authorization is terminated or revoked sooner.  Performed at [facility], [place] [address] [place], [place] [address]   MRSA PCR Screening     Status: None   Collection Time: 09/04/19  3:21 PM   Specimen: Nasal Mucosa; Nasopharyngeal  Result Value Ref Range Status   MRSA by PCR NEGATIVE NEGATIVE Final    Comment:        The GeneXpert MRSA Assay (FDA approved for NASAL specimens only), is one component of a comprehensive MRSA colonization surveillance program. It is not intended to diagnose MRSA infection nor to guide or monitor treatment for MRSA infections. Performed at [facility], [address] [place], [place] [address]      Radiology Reports DG Chest Portable 1 View  Result Date: 09/03/2019 CLINICAL DATA:  Increasing shortness of breath over the past 7 days. EXAM: PORTABLE CHEST 1 VIEW COMPARISON:  PA and lateral chest 03/29/2017. FINDINGS: There is cardiomegaly without edema. Lungs are emphysematous. No consolidative process, pneumothorax or effusion. Atherosclerosis noted. IMPRESSION: No acute disease. Cardiomegaly. Aortic Atherosclerosis (ICD10-I70.0) and Emphysema (ICD10-J43.9). Electronically Signed   By: Inge Rise M.D.   On: 09/03/2019 10:07     CBC Recent Labs  Lab 09/03/19 0955 09/04/19 0754  WBC 6.7 6.8  HGB 8.9* 8.4*  HCT 28.1* 27.3*  PLT 229 233  MCV 90.1 90.7  MCH 28.5 27.9  MCHC 31.7 30.8  RDW 14.6 14.6  LYMPHSABS 0.8  --   MONOABS 0.7  --   EOSABS 0.3  --   BASOSABS 0.0  --     Chemistries  Recent Labs  Lab 09/03/19 0955 09/03/19 1206 09/04/19 0754  NA 136  --  135  K 2.7*  --  4.4  CL 97*  --  100  CO2 27  --  25  GLUCOSE 100*  --  92  BUN 11  --  11  CREATININE 1.43*  --  1.51*  CALCIUM 8.6*  --  8.7*  MG  --  2.2  --   AST 22  --   --   ALT 10  --   --   ALKPHOS 123  --   --   BILITOT 1.4*  --   --    ------------------------------------------------------------------------------------------------------------------ No results for input(s): CHOL, HDL, LDLCALC, TRIG, CHOLHDL, LDLDIRECT in the last 72 hours.  Lab Results  Component Value Date   HGBA1C 4.9 09/23/2016   ------------------------------------------------------------------------------------------------------------------ No results for input(s): TSH, T4TOTAL, T3FREE, THYROIDAB in the last 72 hours.  Invalid input(s): FREET3 ------------------------------------------------------------------------------------------------------------------ No results for input(s): VITAMINB12, FOLATE, FERRITIN, TIBC, IRON, RETICCTPCT in the last 72 hours.  Coagulation profile No results for input(s): INR,  PROTIME in the last 168 hours.  No results for input(s): DDIMER in the last 72 hours.  Cardiac Enzymes No results for input(s): CKMB, TROPONINI, MYOGLOBIN in the last 168 hours.  Invalid input(s): CK ------------------------------------------------------------------------------------------------------------------    Component Value Date/Time   BNP 715.0 (H) 09/03/2019 5726     Roxan Hockey M.D on 09/04/2019 at 7:07 PM  Go to [email or web address] - for contact info  [facility] - Office  [phone]

## 2019-09-05 ENCOUNTER — Encounter (HOSPITAL_COMMUNITY)
Admission: EM | Disposition: A | Discharge status: Home / Self Care | Payer: Self-pay | Source: Home / Self Care | Attending: Family Medicine

## 2019-09-05 ENCOUNTER — Inpatient Hospital Stay (HOSPITAL_COMMUNITY): Payer: Medicare Other

## 2019-09-05 DIAGNOSIS — E876 Hypokalemia: Secondary | ICD-10-CM | POA: Diagnosis present

## 2019-09-05 DIAGNOSIS — K922 Gastrointestinal hemorrhage, unspecified: Secondary | ICD-10-CM | POA: Diagnosis present

## 2019-09-05 DIAGNOSIS — R1310 Dysphagia, unspecified: Secondary | ICD-10-CM | POA: Diagnosis not present

## 2019-09-05 DIAGNOSIS — D649 Anemia, unspecified: Secondary | ICD-10-CM

## 2019-09-05 DIAGNOSIS — K219 Gastro-esophageal reflux disease without esophagitis: Secondary | ICD-10-CM | POA: Diagnosis present

## 2019-09-05 DIAGNOSIS — R0602 Shortness of breath: Secondary | ICD-10-CM | POA: Diagnosis not present

## 2019-09-05 DIAGNOSIS — K2950 Unspecified chronic gastritis without bleeding: Secondary | ICD-10-CM | POA: Diagnosis not present

## 2019-09-05 DIAGNOSIS — K921 Melena: Secondary | ICD-10-CM | POA: Diagnosis not present

## 2019-09-05 DIAGNOSIS — Z7901 Long term (current) use of anticoagulants: Secondary | ICD-10-CM | POA: Diagnosis not present

## 2019-09-05 DIAGNOSIS — I5032 Chronic diastolic (congestive) heart failure: Secondary | ICD-10-CM | POA: Diagnosis not present

## 2019-09-05 DIAGNOSIS — K3189 Other diseases of stomach and duodenum: Secondary | ICD-10-CM | POA: Diagnosis not present

## 2019-09-05 DIAGNOSIS — J439 Emphysema, unspecified: Secondary | ICD-10-CM | POA: Diagnosis present

## 2019-09-05 DIAGNOSIS — Z809 Family history of malignant neoplasm, unspecified: Secondary | ICD-10-CM | POA: Diagnosis not present

## 2019-09-05 DIAGNOSIS — D123 Benign neoplasm of transverse colon: Secondary | ICD-10-CM | POA: Diagnosis not present

## 2019-09-05 DIAGNOSIS — D124 Benign neoplasm of descending colon: Secondary | ICD-10-CM | POA: Diagnosis not present

## 2019-09-05 DIAGNOSIS — D509 Iron deficiency anemia, unspecified: Secondary | ICD-10-CM | POA: Diagnosis not present

## 2019-09-05 DIAGNOSIS — I4821 Permanent atrial fibrillation: Secondary | ICD-10-CM | POA: Diagnosis present

## 2019-09-05 DIAGNOSIS — N183 Chronic kidney disease, stage 3 unspecified: Secondary | ICD-10-CM | POA: Diagnosis not present

## 2019-09-05 DIAGNOSIS — I130 Hypertensive heart and chronic kidney disease with heart failure and stage 1 through stage 4 chronic kidney disease, or unspecified chronic kidney disease: Secondary | ICD-10-CM | POA: Diagnosis not present

## 2019-09-05 DIAGNOSIS — R0600 Dyspnea, unspecified: Secondary | ICD-10-CM

## 2019-09-05 DIAGNOSIS — Z79891 Long term (current) use of opiate analgesic: Secondary | ICD-10-CM | POA: Diagnosis not present

## 2019-09-05 DIAGNOSIS — D120 Benign neoplasm of cecum: Secondary | ICD-10-CM | POA: Diagnosis not present

## 2019-09-05 DIAGNOSIS — K5730 Diverticulosis of large intestine without perforation or abscess without bleeding: Secondary | ICD-10-CM | POA: Diagnosis present

## 2019-09-05 DIAGNOSIS — F419 Anxiety disorder, unspecified: Secondary | ICD-10-CM | POA: Diagnosis present

## 2019-09-05 DIAGNOSIS — K222 Esophageal obstruction: Secondary | ICD-10-CM | POA: Diagnosis not present

## 2019-09-05 DIAGNOSIS — Z20822 Contact with and (suspected) exposure to covid-19: Secondary | ICD-10-CM | POA: Diagnosis not present

## 2019-09-05 DIAGNOSIS — Z87891 Personal history of nicotine dependence: Secondary | ICD-10-CM | POA: Diagnosis not present

## 2019-09-05 DIAGNOSIS — K2970 Gastritis, unspecified, without bleeding: Secondary | ICD-10-CM | POA: Diagnosis present

## 2019-09-05 DIAGNOSIS — M1990 Unspecified osteoarthritis, unspecified site: Secondary | ICD-10-CM | POA: Diagnosis present

## 2019-09-05 DIAGNOSIS — R1314 Dysphagia, pharyngoesophageal phase: Secondary | ICD-10-CM | POA: Diagnosis present

## 2019-09-05 DIAGNOSIS — Z853 Personal history of malignant neoplasm of breast: Secondary | ICD-10-CM | POA: Diagnosis not present

## 2019-09-05 DIAGNOSIS — J438 Other emphysema: Secondary | ICD-10-CM | POA: Diagnosis not present

## 2019-09-05 DIAGNOSIS — D125 Benign neoplasm of sigmoid colon: Secondary | ICD-10-CM | POA: Diagnosis not present

## 2019-09-05 DIAGNOSIS — K319 Disease of stomach and duodenum, unspecified: Secondary | ICD-10-CM | POA: Diagnosis present

## 2019-09-05 DIAGNOSIS — N1832 Chronic kidney disease, stage 3b: Secondary | ICD-10-CM | POA: Diagnosis present

## 2019-09-05 DIAGNOSIS — Z79899 Other long term (current) drug therapy: Secondary | ICD-10-CM | POA: Diagnosis not present

## 2019-09-05 DIAGNOSIS — I10 Essential (primary) hypertension: Secondary | ICD-10-CM | POA: Diagnosis not present

## 2019-09-05 DIAGNOSIS — K635 Polyp of colon: Secondary | ICD-10-CM | POA: Diagnosis not present

## 2019-09-05 LAB — CBC
HCT: 26.8 % — ABNORMAL LOW (ref 36.0–46.0)
Hemoglobin: 8.2 g/dL — ABNORMAL LOW (ref 12.0–15.0)
MCH: 28 pg (ref 26.0–34.0)
MCHC: 30.6 g/dL (ref 30.0–36.0)
MCV: 91.5 fL (ref 80.0–100.0)
Platelets: 238 10*3/uL (ref 150–400)
RBC: 2.93 MIL/uL — ABNORMAL LOW (ref 3.87–5.11)
RDW: 14.7 % (ref 11.5–15.5)
WBC: 7.1 10*3/uL (ref 4.0–10.5)
nRBC: 0 % (ref 0.0–0.2)

## 2019-09-05 LAB — BASIC METABOLIC PANEL
Anion gap: 11 (ref 5–15)
BUN: 14 mg/dL (ref 8–23)
CO2: 26 mmol/L (ref 22–32)
Calcium: 9.1 mg/dL (ref 8.9–10.3)
Chloride: 100 mmol/L (ref 98–111)
Creatinine, Ser: 1.58 mg/dL — ABNORMAL HIGH (ref 0.44–1.00)
GFR calc Af Amer: 35 mL/min — ABNORMAL LOW (ref >60)
GFR calc non Af Amer: 31 mL/min — ABNORMAL LOW (ref >60)
Glucose, Bld: 110 mg/dL — ABNORMAL HIGH (ref 70–99)
Potassium: 4.1 mmol/L (ref 3.5–5.1)
Sodium: 137 mmol/L (ref 135–145)

## 2019-09-05 SURGERY — COLONOSCOPY WITH PROPOFOL
Anesthesia: Monitor Anesthesia Care

## 2019-09-05 MED ORDER — ONDANSETRON HCL 4 MG/2ML IJ SOLN
4.0000 mg | Freq: Once | INTRAMUSCULAR | Status: AC
  Administered 2019-09-05: 4 mg via INTRAVENOUS

## 2019-09-05 MED ORDER — PEG 3350-KCL-NA BICARB-NACL 420 G PO SOLR
2000.0000 mL | Freq: Once | ORAL | Status: AC
  Administered 2019-09-05: 2000 mL via ORAL

## 2019-09-05 NOTE — Plan of Care (Signed)

## 2019-09-05 NOTE — Progress Notes (Signed)
Patient had loose nonbloody stools this morning likely due to partial colonoscopy preparation. She declined to have the procedure earlier.  Became anxious.  Felt she could not complete preparation due to nausea associated with its ingestion.  She tells me she is more concerned about her esophageal dysphagia than any potential bleeding issue.  Lengthy discussion with patient and her husband regarding the importance of GI evaluation (EGD and colonoscopy) given her presentation.  She now would like to have procedures performed as long as she gets something for nausea prior to ingesting the other half of the preparation.    Vital signs in last 24 hours: Temp:  [98.6 F (37 C)-98.7 F (37.1 C)] 98.6 F (37 C) (06/12 0609) Pulse Rate:  [97-105] 97 (06/12 0609) Resp:  [16] 16 (06/12 0609) BP: (120-125)/(70-93) 120/93 (06/12 0609) SpO2:  [94 %-100 %] 97 % (06/12 0609) Last BM Date: 09/04/19 General:   Alert,   pleasant and cooperative in NAD Abdomen: Abdomen is soft and nontender without appreciable mass organomegaly  Extremities:  Without clubbing or edema.    Intake/Output from previous day: 06/11 0701 - 06/12 0700 In: 854.8 [I.V.:854.8] Out: -  Intake/Output this shift: No intake/output data recorded.  Lab Results: Recent Labs    09/03/19 0955 09/04/19 0754 09/05/19 0605  WBC 6.7 6.8 7.1  HGB 8.9* 8.4* 8.2*  HCT 28.1* 27.3* 26.8*  PLT 229 233 238   BMET Recent Labs    09/03/19 0955 09/04/19 0754 09/05/19 0605  NA 136 135 137  K 2.7* 4.4 4.1  CL 97* 100 100  CO2 _0 GLUCOSE 100* 92 110*  BUN _1 CREATININE 1.43* 1.51* 1.58*  CALCIUM 8.6* 8.7* 9.1   LFT Recent Labs    09/03/19 0955  PROT 7.3  ALBUMIN 3.5  AST 22  ALT 10  ALKPHOS 123  BILITOT 1.4*     Impression: Pleasant 79 year old lady anticoagulated presenting with acute on chronic anemia paper hematochezia/Hemoccult positive.  Also notes chronic esophageal dysphagia. She has remained  stable this hospitalization.  No overt bleeding.  As previously recommended, diagnostic colonoscopy discussed at some length with her and her husband this afternoon.  Esophageal dysphagia symptoms are quite bothersome to the patient as well. She is interested in undergoing GI evaluation now while she is here.  She would like something for nausea prior to consuming the other half of the preparation.  Recommendations: I have offered the patient an EGD with esophageal dilation as feasible/appropriate as well as a colonoscopy tomorrow.  The risks, benefits, limitations, imponderables and alternatives regarding both EGD and colonoscopy have been reviewed with the patient. Questions have been answered.  Both patient and husband are agreeable.  Will provide Zofran 4 mg IV x1 dose prior to the administration of 2 L of GoLYTELY this evening. Will enlist the help of anesthesia for MAC given her history of polypharmacy.  Further recommendations to follow.

## 2019-09-05 NOTE — H&P (View-Only) (Signed)
Patient had loose nonbloody stools this morning likely due to partial colonoscopy preparation. She declined to have the procedure earlier.  Became anxious.  Felt she could not complete preparation due to nausea associated with its ingestion.  She tells me she is more concerned about her esophageal dysphagia than any potential bleeding issue.  Lengthy discussion with patient and her husband regarding the importance of GI evaluation (EGD and colonoscopy) given her presentation.  She now would like to have procedures performed as long as she gets something for nausea prior to ingesting the other half of the preparation.    Vital signs in last 24 hours: Temp:  [98.6 F (37 C)-98.7 F (37.1 C)] 98.6 F (37 C) (06/12 0609) Pulse Rate:  [97-105] 97 (06/12 0609) Resp:  [16] 16 (06/12 0609) BP: (120-125)/(70-93) 120/93 (06/12 0609) SpO2:  [94 %-100 %] 97 % (06/12 0609) Last BM Date: 09/04/19 General:   Alert,   pleasant and cooperative in NAD Abdomen: Abdomen is soft and nontender without appreciable mass organomegaly  Extremities:  Without clubbing or edema.    Intake/Output from previous day: 06/11 0701 - 06/12 0700 In: 854.8 [I.V.:854.8] Out: -  Intake/Output this shift: No intake/output data recorded.  Lab Results: Recent Labs    09/03/19 0955 09/04/19 0754 09/05/19 0605  WBC 6.7 6.8 7.1  HGB 8.9* 8.4* 8.2*  HCT 28.1* 27.3* 26.8*  PLT 229 233 238   BMET Recent Labs    09/03/19 0955 09/04/19 0754 09/05/19 0605  NA 136 135 137  K 2.7* 4.4 4.1  CL 97* 100 100  CO2 27 25 26  GLUCOSE 100* 92 110*  BUN 11 11 14  CREATININE 1.43* 1.51* 1.58*  CALCIUM 8.6* 8.7* 9.1   LFT Recent Labs    09/03/19 0955  PROT 7.3  ALBUMIN 3.5  AST 22  ALT 10  ALKPHOS 123  BILITOT 1.4*     Impression: Pleasant 78-year-old lady anticoagulated presenting with acute on chronic anemia paper hematochezia/Hemoccult positive.  Also notes chronic esophageal dysphagia. She has remained  stable this hospitalization.  No overt bleeding.  As previously recommended, diagnostic colonoscopy discussed at some length with her and her husband this afternoon.  Esophageal dysphagia symptoms are quite bothersome to the patient as well. She is interested in undergoing GI evaluation now while she is here.  She would like something for nausea prior to consuming the other half of the preparation.  Recommendations: I have offered the patient an EGD with esophageal dilation as feasible/appropriate as well as a colonoscopy tomorrow.  The risks, benefits, limitations, imponderables and alternatives regarding both EGD and colonoscopy have been reviewed with the patient. Questions have been answered.  Both patient and husband are agreeable.  Will provide Zofran 4 mg IV x1 dose prior to the administration of 2 L of GoLYTELY this evening. Will enlist the help of anesthesia for MAC given her history of polypharmacy.  Further recommendations to follow.   

## 2019-09-05 NOTE — Progress Notes (Addendum)
Patient's refusal of procedure noted.  Therefore, colonoscopy and EGD (for dysphagia).  Canceled for today.  We will reassess over the weekend.  We will keep her on a clear liquid diet.

## 2019-09-05 NOTE — Progress Notes (Signed)
Pt states she is unable to complete bowel prep for colonoscopy today; pt states she vomited a large amount after starting the prep and does not want to proceed with colonoscopy today. Dr. [name] notified. Deirdre Pippins, RN

## 2019-09-05 NOTE — Progress Notes (Signed)
Patient Demographics:    Bailey Bishop, is a 79 y.o. female, DOB - Aug 12, 1940, XAJ:[id number]  Admit date - 09/03/2019   Admitting Physician Kathie Dike, MD  Outpatient Primary MD for the patient is [name], MD  LOS - 0   Chief Complaint  Patient presents with   Shortness of Breath        Subjective:    Bailey Bishop patient unable to tolerate bowel prep overnight due to persistent nausea and vomiting.  She continues to complain of shortness of breath which is worse on exertion.   Assessment  & Plan :    Principal Problem:   Symptomatic anemia Active Problems:   Essential hypertension   Hypokalemia   Anticoagulated   COPD (chronic obstructive pulmonary disease) (HCC)   Persistent atrial fibrillation (HCC)   Iron deficiency anemia   CKD (chronic kidney disease), stage IIIb   GI bleed   Brief Summary:- 79 y.o. female with past medical history ofprior breast cancer, HTN, GERD, as well as history of persistent atrial fibrillation (s/punsuccessful DCCV in 01/2017 and intolerant to Amiodarone in the past, started on Tikosyn in 11/2017 and failed therapy, intolerant to Lopressor, Toprol-XL, Coreg, Cardizem, and Cardizem CD in the past),HTN, chronic diastolic CHF, and Stage 3 CKD presents to the ED with shortness of breath, nausea dry heaving and diarrhea since Saturday 08/28/19--- admitted on 09/03/2019 with symptomatic anemia and over 3 g drop in hemoglobin while on Eliquis for A. fib   A/p 1)Symptomatic Anemia with shortness of breath and dyspnea on exertion--- hemoglobin down to 8.4 from a baseline of around 12 --Patient is on Eliquis and has h/o hemorrhoids -Last colonoscopy 2016 with polypectomy and hemorrhoids- --monitor H&H, transfuse if becomes indicated -Hold Eliquis for now (last dose am of 09/03/19) -c/n IV Protonix as ordered given recurrent dry heaves with concerns  about possible gastritis or esophagitis - admitted on 09/03/2019 with symptomatic anemia and over 3 g drop in hemoglobin while on Eliquis for A. Fib -Initial plan for colonoscopy on 6/12, patient was unable to tolerate prep due to persistent nausea and vomiting. -Plan is for colonoscopy on 09/06/2019 with repeat attempted bowel prep after premedication with antiemetics  2)Hypokalemia--- potassium is up to 4.4 from 2.7 after replacement  -Hypokalemia is due to GI losses and in a  patient with persistent diarrhea and Lasix use -Magnesium WNL  3) permanent atrial fibrillation--- s/punsuccessful DCCV in 01/2017 and intolerant to Amiodarone in the past, started on Tikosyn in 11/2017 and failed therapy, intolerant to Lopressor, Toprol-XL, Coreg, Cardizem, and Cardizem CD in the past--- -restarted on amiodarone, she did not think it agreed with her, so she stopped it and she is back on verapamil for rate control -PTA was on Eliquis for stroke prophylaxis currently on hold due to anemia and presumed hemorrhoidal bleeding -Eliquis on hold since a.m. of 09/03/2019  4) History of chronic chronic diastolic CHF-----shortness of breath and some dyspnea on exertion, BNP is elevated, but consistent with prior levels, chest x-ray without overt failure, treat empirically with iv Lasix, PTA she was also on po Lasix -Recorded weight is inaccurate fluctuating between 137 and 143 pounds  5) CKD stage - IIIb -Creatinine is up to 1.51 from 1.43 on admission, still  close to patient's baseline   --- renally adjust medications, avoid nephrotoxic agents / dehydration  / hypotension  6) dyspnea of unclear etiology.  Patient reports chronic dyspnea, although is currently on room air.  Shortness of breath is significantly worse on exertion.  She was taking amiodarone and feels that this may be contributing.  Chest x-ray did not show any significant findings.  Will check CT chest.  Disposition/Need for in-Hospital Stay-  patient unable to be discharged at this time due to --- concerns about GI bleed, symptomatic anemia and severe hypokalemia --Plan is for colonoscopy on 09/06/2019   Dispo: The patient is from: Home  Anticipated d/c is to: Home  Anticipated d/c date is: 2 day  Patient currently is not medically stable to d/c.  Code Status : full code  Family Communication:   (patient is alert, awake and coherent)  -With husband 6/12  Consults  :  Gi  DVT Prophylaxis  :    - SCDs--Eliquis on hold due to GI bleed  Lab Results  Component Value Date   PLT 238 09/05/2019    Inpatient Medications  Scheduled Meds:  calcium-vitamin D  1 tablet Oral BID   furosemide  20 mg Intravenous BID   pantoprazole (PROTONIX) IV  40 mg Intravenous Q12H   polyethylene glycol-electrolytes  2,000 mL Oral Once   potassium chloride SA  20 mEq Oral Daily   sodium chloride flush  3 mL Intravenous Q12H   traZODone  50 mg Oral QHS   verapamil  40 mg Oral TID   Continuous Infusions:  sodium chloride     sodium chloride 50 mL/hr at 09/04/19 0900   sodium chloride 20 mL/hr at 09/04/19 1353   PRN Meds:.sodium chloride, acetaminophen, albuterol, albuterol, ALPRAZolam, fentaNYL (SUBLIMAZE) injection, ondansetron **OR** ondansetron (ZOFRAN) IV, oxyCODONE, polyethylene glycol, sodium chloride flush    Anti-infectives (From admission, onward)   None        Objective:   Vitals:   09/04/19 2021 09/04/19 2310 09/05/19 0609 09/05/19 1424  BP: 125/79  (!) 120/93 117/84  Pulse: (!) 105  97 67  Resp: 16  16 16   Temp: 98.7 F (37.1 C)  98.6 F (37 C) 98.2 F (36.8 C)  TempSrc: Oral  Oral Oral  SpO2: 94% 100% 97% 98%  Weight:      Height:        Wt Readings from Last 3 Encounters:  09/03/19 65.1 kg  07/14/19 62.1 kg  06/09/19 61.7 kg     Intake/Output Summary (Last 24 hours) at 09/05/2019 2027 Last data filed at 09/05/2019 0600 Gross per 24 hour  Intake 854.84 ml   Output --  Net 854.84 ml    Physical Exam  General exam: Alert, awake, oriented x 3 Respiratory system: Clear to auscultation. Respiratory effort normal. Cardiovascular system:RRR. No murmurs, rubs, gallops. Gastrointestinal system: Abdomen is nondistended, soft and nontender. No organomegaly or masses felt. Normal bowel sounds heard. Central nervous system: Alert and oriented. No focal neurological deficits. Extremities: No C/C/E, +pedal pulses Skin: No rashes, lesions or ulcers Psychiatry: Judgement and insight appear normal. Mood & affect appropriate.     Data Review:   Micro Results Recent Results (from the past 240 hour(s))  SARS CORONAVIRUS 2 (TAT 6-24 HRS) Nasopharyngeal Nasopharyngeal Swab     Status: None   Collection Time: 09/03/19  9:31 AM   Specimen: Nasopharyngeal Swab  Result Value Ref Range Status   SARS Coronavirus 2 NEGATIVE NEGATIVE Final    Comment: (  NOTE) SARS-CoV-2 target nucleic acids are NOT DETECTED.  The SARS-CoV-2 RNA is generally detectable in upper and lower respiratory specimens during the acute phase of infection. Negative results do not preclude SARS-CoV-2 infection, do not rule out co-infections with other pathogens, and should not be used as the sole basis for treatment or other patient management decisions. Negative results must be combined with clinical observations, patient history, and epidemiological information. The expected result is Negative.  Fact Sheet for Patients: SugarRoll.be  Fact Sheet for Healthcare Providers: https://www.woods-mathews.com/  This test is not yet approved or cleared by the [place] FDA and  has been authorized for detection and/or diagnosis of SARS-CoV-2 by FDA under an Emergency Use Authorization (EUA). This EUA will remain  in effect (meaning this test can be used) for the duration of the COVID-19 declaration under Se ction 564(b)(1) of the Act, 21  U.S.C. section 360bbb-3(b)(1), unless the authorization is terminated or revoked sooner.  Performed at [facility], [place] [address] [place], [place] [address]   MRSA PCR Screening     Status: None   Collection Time: 09/04/19  3:21 PM   Specimen: Nasal Mucosa; Nasopharyngeal  Result Value Ref Range Status   MRSA by PCR NEGATIVE NEGATIVE Final    Comment:        The GeneXpert MRSA Assay (FDA approved for NASAL specimens only), is one component of a comprehensive MRSA colonization surveillance program. It is not intended to diagnose MRSA infection nor to guide or monitor treatment for MRSA infections. Performed at [facility], [address], [place], [place] [address]     Radiology Reports DG Chest Portable 1 View  Result Date: 09/03/2019 CLINICAL DATA:  Increasing shortness of breath over the past 7 days. EXAM: PORTABLE CHEST 1 VIEW COMPARISON:  PA and lateral chest 03/29/2017. FINDINGS: There is cardiomegaly without edema. Lungs are emphysematous. No consolidative process, pneumothorax or effusion. Atherosclerosis noted. IMPRESSION: No acute disease. Cardiomegaly. Aortic Atherosclerosis (ICD10-I70.0) and Emphysema (ICD10-J43.9). Electronically Signed   By: Inge Rise M.D.   On: 09/03/2019 10:07     CBC Recent Labs  Lab 09/03/19 0955 09/04/19 0754 09/05/19 0605  WBC 6.7 6.8 7.1  HGB 8.9* 8.4* 8.2*  HCT 28.1* 27.3* 26.8*  PLT 229 233 238  MCV 90.1 90.7 91.5  MCH 28.5 27.9 28.0  MCHC 31.7 30.8 30.6  RDW 14.6 14.6 14.7  LYMPHSABS 0.8  --   --   MONOABS 0.7  --   --   EOSABS 0.3  --   --   BASOSABS 0.0  --   --     Chemistries  Recent Labs  Lab 09/03/19 0955 09/03/19 1206 09/04/19 0754 09/05/19 0605  NA 136  --  135 137  K 2.7*  --  4.4 4.1  CL 97*  --  100 100  CO2 27  --  25 26  GLUCOSE 100*  --  92 110*  BUN 11  --  11 14  CREATININE 1.43*  --  1.51* 1.58*  CALCIUM 8.6*  --  8.7* 9.1  MG  --  2.2  --   --   AST 22  --   --   --   ALT 10   --   --   --   ALKPHOS 123  --   --   --   BILITOT 1.4*  --   --   --    ------------------------------------------------------------------------------------------------------------------ No results for input(s): CHOL, HDL, LDLCALC, TRIG, CHOLHDL,  LDLDIRECT in the last 72 hours.  Lab Results  Component Value Date   HGBA1C 4.9 09/23/2016   ------------------------------------------------------------------------------------------------------------------ No results for input(s): TSH, T4TOTAL, T3FREE, THYROIDAB in the last 72 hours.  Invalid input(s): FREET3 ------------------------------------------------------------------------------------------------------------------ No results for input(s): VITAMINB12, FOLATE, FERRITIN, TIBC, IRON, RETICCTPCT in the last 72 hours.  Coagulation profile No results for input(s): INR, PROTIME in the last 168 hours.  No results for input(s): DDIMER in the last 72 hours.  Cardiac Enzymes No results for input(s): CKMB, TROPONINI, MYOGLOBIN in the last 168 hours.  Invalid input(s): CK ------------------------------------------------------------------------------------------------------------------    Component Value Date/Time   BNP 715.0 (H) 09/03/2019 0881     Kathie Dike M.D on 09/05/2019 at 8:27 PM  Go to [email or web address] - for contact info  [facility] - Office  [phone]

## 2019-09-06 ENCOUNTER — Inpatient Hospital Stay (HOSPITAL_COMMUNITY): Payer: Medicare Other | Admitting: Anesthesiology

## 2019-09-06 ENCOUNTER — Encounter (HOSPITAL_COMMUNITY)
Admission: EM | Disposition: A | Discharge status: Home / Self Care | Payer: Self-pay | Source: Home / Self Care | Attending: Family Medicine

## 2019-09-06 DIAGNOSIS — R1310 Dysphagia, unspecified: Secondary | ICD-10-CM

## 2019-09-06 DIAGNOSIS — K222 Esophageal obstruction: Secondary | ICD-10-CM

## 2019-09-06 HISTORY — PX: ESOPHAGOGASTRODUODENOSCOPY (EGD) WITH PROPOFOL: SHX5813

## 2019-09-06 HISTORY — PX: ESOPHAGEAL DILATION: SHX303

## 2019-09-06 HISTORY — PX: BIOPSY: SHX5522

## 2019-09-06 HISTORY — PX: POLYPECTOMY: SHX5525

## 2019-09-06 HISTORY — PX: COLONOSCOPY WITH PROPOFOL: SHX5780

## 2019-09-06 LAB — CBC
HCT: 26.7 % — ABNORMAL LOW (ref 36.0–46.0)
Hemoglobin: 8.1 g/dL — ABNORMAL LOW (ref 12.0–15.0)
MCH: 27.6 pg (ref 26.0–34.0)
MCHC: 30.3 g/dL (ref 30.0–36.0)
MCV: 90.8 fL (ref 80.0–100.0)
Platelets: 260 10*3/uL (ref 150–400)
RBC: 2.94 MIL/uL — ABNORMAL LOW (ref 3.87–5.11)
RDW: 14.8 % (ref 11.5–15.5)
WBC: 7.2 10*3/uL (ref 4.0–10.5)
nRBC: 0 % (ref 0.0–0.2)

## 2019-09-06 LAB — BASIC METABOLIC PANEL
Anion gap: 13 (ref 5–15)
BUN: 16 mg/dL (ref 8–23)
CO2: 20 mmol/L — ABNORMAL LOW (ref 22–32)
Calcium: 8.6 mg/dL — ABNORMAL LOW (ref 8.9–10.3)
Chloride: 98 mmol/L (ref 98–111)
Creatinine, Ser: 1.66 mg/dL — ABNORMAL HIGH (ref 0.44–1.00)
GFR calc Af Amer: 33 mL/min — ABNORMAL LOW (ref >60)
GFR calc non Af Amer: 29 mL/min — ABNORMAL LOW (ref >60)
Glucose, Bld: 92 mg/dL (ref 70–99)
Potassium: 4.1 mmol/L (ref 3.5–5.1)
Sodium: 131 mmol/L — ABNORMAL LOW (ref 135–145)

## 2019-09-06 SURGERY — COLONOSCOPY WITH PROPOFOL
Anesthesia: General

## 2019-09-06 MED ORDER — STERILE WATER FOR IRRIGATION IR SOLN
Status: DC | PRN
  Administered 2019-09-06: 2.5 mL

## 2019-09-06 MED ORDER — OMEPRAZOLE 40 MG PO CPDR: 40.0000 mg | DELAYED_RELEASE_CAPSULE | Freq: Every day | ORAL | 0 refills | Status: DC

## 2019-09-06 MED ORDER — LIDOCAINE 2% (20 MG/ML) 5 ML SYRINGE
INTRAMUSCULAR | Status: AC
  Filled 2019-09-06: qty 5

## 2019-09-06 MED ORDER — LIDOCAINE VISCOUS HCL 2 % MT SOLN
OROMUCOSAL | Status: AC
  Filled 2019-09-06: qty 15

## 2019-09-06 MED ORDER — PROPOFOL 500 MG/50ML IV EMUL
INTRAVENOUS | Status: DC | PRN
  Administered 2019-09-06: 100 ug/kg/min via INTRAVENOUS

## 2019-09-06 MED ORDER — LIDOCAINE VISCOUS HCL 2 % MT SOLN: 15.0000 mL | Freq: Once | OROMUCOSAL | Status: DC

## 2019-09-06 MED ORDER — APIXABAN 5 MG PO TABS: 5.0000 mg | ORAL_TABLET | Freq: Two times a day (BID) | ORAL | 6 refills | Status: DC

## 2019-09-06 MED ORDER — LIDOCAINE VISCOUS HCL 2 % MT SOLN
OROMUCOSAL | Status: DC | PRN
  Administered 2019-09-06: 1 via OROMUCOSAL

## 2019-09-06 MED ORDER — LIDOCAINE HCL (CARDIAC) PF 100 MG/5ML IV SOSY
PREFILLED_SYRINGE | INTRAVENOUS | Status: DC | PRN
  Administered 2019-09-06: 40 mg via INTRATRACHEAL

## 2019-09-06 MED ORDER — PROPOFOL 10 MG/ML IV BOLUS
INTRAVENOUS | Status: DC | PRN
  Administered 2019-09-06: 50 mg via INTRAVENOUS
  Administered 2019-09-06: 20 mg via INTRAVENOUS

## 2019-09-06 MED ORDER — FENTANYL CITRATE (PF) 100 MCG/2ML IJ SOLN
INTRAMUSCULAR | Status: AC
  Filled 2019-09-06: qty 2

## 2019-09-06 MED ORDER — LACTATED RINGERS IV SOLN: Freq: Once | INTRAVENOUS | Status: AC

## 2019-09-06 MED ORDER — PHENYLEPHRINE 40 MCG/ML (10ML) SYRINGE FOR IV PUSH (FOR BLOOD PRESSURE SUPPORT)
PREFILLED_SYRINGE | INTRAVENOUS | Status: DC | PRN
  Administered 2019-09-06: 80 ug via INTRAVENOUS

## 2019-09-06 MED ORDER — PROPOFOL 10 MG/ML IV BOLUS
INTRAVENOUS | Status: AC
  Filled 2019-09-06: qty 40

## 2019-09-06 NOTE — Discharge Summary (Signed)
Physician Discharge Summary  Bailey Bishop PNT:[id number] DOB: 22-Jul-1940 DOA: 09/03/2019  PCP: [name], MD  Admit date: 09/03/2019 Discharge date: 09/06/2019  Admitted From: Home Disposition: Home  Recommendations for Outpatient Follow-up:  1. Follow up with PCP in 1-2 weeks 2. Please obtain BMP/CBC in one week 3. Follow-up with gastroenterology as an outpatient for biopsy results   Discharge Condition: Stable CODE STATUS: Full code Diet recommendation: Heart healthy  Brief/Interim Summary: 79 y.o.femalewith past medical history ofprior breast cancer, HTN, GERD, as well as history of persistent atrial fibrillation (s/punsuccessful DCCV in 01/2017 and intolerant to Amiodarone in the past, started on Tikosyn in 11/2017 and failed therapy, intolerant to Lopressor, Toprol-XL, Coreg, Cardizem, and Cardizem CD in the past),HTN, chronic diastolic CHF, and Stage 3 CKDpresents to the ED with shortness of breath, nausea dry heaving and diarrhea since Saturday 08/28/19--- admitted on 09/03/2019 with symptomatic anemia and over 3 g drop in hemoglobin while on Eliquis for A. fib  Discharge Diagnoses:  Principal Problem:   Symptomatic anemia Active Problems:   Essential hypertension   Hypokalemia   Anticoagulated   COPD (chronic obstructive pulmonary disease) (HCC)   Persistent atrial fibrillation (HCC)   Iron deficiency anemia   CKD (chronic kidney disease), stage IIIb   GI bleed  1. Symptomatic anemia.  Hemoglobin down to 8.4 from baseline of 12.  Patient was taking Eliquis prior to admission.  She was admitted to the hospital and seen by gastroenterology.  She did not require transfusion of PRBC.  She underwent colonoscopy/EGD.  Results indicated evidence of gastritis and several polyps in the colon which were removed.  She did not have any evidence of active bleeding.  Recommendations were to continue on PPI and hold anticoagulation until 6/20.  Patient will follow up with  gastroenterology as an outpatient. 2. Hypokalemia.  Improved after replacement. 3. Permanent atrial fibrillation.  Chronically on verapamil.  She is anticoagulated with Eliquis which will be restarted on 6/20. 4. History of chronic diastolic congestive heart failure.  She did complain of some shortness of breath on exertion.  CT of the chest was performed since she does have a history of amiodarone use.  This does describe some small pleural effusions.  Patient was continued on diuretics throughout hospital stay.  At the time of discharge, she reports improvement of her shortness of breath and is able to ambulate without difficulty. 5. Chronic kidney disease stage IIIb.  Creatinine currently at baseline.  Continue to monitor as an outpatient.  Discharge Instructions  Discharge Instructions    Diet - low sodium heart healthy   Complete by: As directed    Increase activity slowly   Complete by: As directed      Allergies as of 09/06/2019      Reactions   Cardizem Cd [diltiazem Hcl Er Beads] Diarrhea   Amiodarone Nausea And Vomiting   Metoprolol Tartrate Diarrhea   Sulfa Antibiotics Nausea And Vomiting   Tomato Other (See Comments)   Burns stomach   Carvedilol Diarrhea, Rash   Chocolate Rash, Other (See Comments)   Dark chocolate      Medication List    STOP taking these medications   cyclobenzaprine 5 MG tablet Commonly known as: FLEXERIL   docusate sodium 100 MG capsule Commonly known as: COLACE   Lidocaine 4 % Ptch   oxyCODONE 5 MG immediate release tablet Commonly known as: Roxicodone   oxyCODONE-acetaminophen 5-325 MG tablet Commonly known as: PERCOCET/ROXICET     TAKE these  medications   acetaminophen 650 MG CR tablet Commonly known as: TYLENOL Take 650 mg by mouth every 8 (eight) hours as needed for pain.   albuterol 108 (90 Base) MCG/ACT inhaler Commonly known as: VENTOLIN HFA Inhale 1-2 puffs into the lungs every 6 (six) hours as needed for wheezing or  shortness of breath.   ALPRAZolam 0.5 MG tablet Commonly known as: XANAX Take 0.5 mg by mouth at bedtime as needed for anxiety.   apixaban 5 MG Tabs tablet Commonly known as: Eliquis Take 1 tablet (5 mg total) by mouth 2 (two) times daily. Resume on 6/20 What changed: additional instructions   calcium citrate-vitamin D 315-200 MG-UNIT tablet Commonly known as: CITRACAL+D Take 1 tablet by mouth 2 (two) times daily.   fluticasone 50 MCG/ACT nasal spray Commonly known as: FLONASE Place 1 spray into both nostrils as needed.   furosemide 40 MG tablet Commonly known as: LASIX Take 1 tablet (40 mg total) by mouth daily.   omeprazole 40 MG capsule Commonly known as: PRILOSEC Take 1 capsule (40 mg total) by mouth daily. What changed:   medication strength  how much to take  when to take this  reasons to take this   potassium chloride SA 20 MEQ tablet Commonly known as: KLOR-CON Take 1 tablet (20 mEq total) by mouth daily.   traZODone 50 MG tablet Commonly known as: DESYREL Take 50 mg by mouth at bedtime as needed.   verapamil 40 MG tablet Commonly known as: CALAN Take 1 tablet (40 mg total) by mouth 3 (three) times daily. What changed: when to take this       Follow-up Information    Rourk, Cristopher Estimable, MD Follow up.   Specialty: Gastroenterology Why: office will call with follow up Contact information: [address] [place] [address] [phone]              Allergies  Allergen Reactions  . Cardizem Cd [Diltiazem Hcl Er Beads] Diarrhea  . Amiodarone Nausea And Vomiting  . Metoprolol Tartrate Diarrhea  . Sulfa Antibiotics Nausea And Vomiting  . Tomato Other (See Comments)    Burns stomach  . Carvedilol Diarrhea and Rash  . Chocolate Rash and Other (See Comments)    Dark chocolate    Consultations:  Gastroenterology   Procedures/Studies: CT CHEST WO CONTRAST  Result Date: 09/05/2019 CLINICAL DATA:  Chronic dyspnea. Increased shortness  of breath with exertion. EXAM: CT CHEST WITHOUT CONTRAST TECHNIQUE: Multidetector CT imaging of the chest was performed following the standard protocol without IV contrast. COMPARISON:  Radiograph 09/03/2019. FINDINGS: Cardiovascular: Aortic atherosclerosis. No aortic aneurysm. There is multi chamber cardiomegaly. Minimal mitral annulus and coronary artery calcifications. There is a small pericardial effusion, greatest inferiorly measuring up to 17 mm in depth. Mediastinum/Nodes: No enlarged mediastinal lymph nodes. No bulky hilar adenopathy, evaluation limited in the absence of IV contrast. Heterogeneously enlarged thyroid gland without discrete nodule. No esophageal wall thickening. Surgical clips in the right axilla without axillary adenopathy. No enlarged left axillary lymph nodes. Lungs/Pleura: Small bilateral pleural effusions, right greater than left. Associated compressive atelectasis. Streaky perifissural opacities in the right lower lobe may represent atelectasis, scarring, or small amount of fluid in the fissure. Compressive atelectasis in the lingula related to cardiomegaly. Right upper lobe 12 mm ground-glass nodule, series 4, image 40. No septal thickening or pulmonary edema. Mild motion artifact limits assessment of the mainstem bronchi, no obvious intraluminal debris. Upper Abdomen: Subcentimeter hypodensity in the right hepatic dome, nonspecific. The stomach  is distended with intraluminal fluid. Musculoskeletal: T12 compression fracture with greater than 50% loss of height anteriorly and posterior cortex involvement has progressed in severity from thoracic spine CT 06/06/2019. Unchanged T10 and T11 vertebral body compression fractures. IMPRESSION: 1. Small bilateral pleural effusions, right greater than left, with compressive atelectasis. 2. Streaky perifissural opacities in the right lower lobe may represent atelectasis, scarring, or small amount of fluid in the fissure. 3. Right upper lobe 12 mm  ground-glass nodule. Follow-up non-contrast CT recommended at 3-6 months to confirm persistence. If unchanged, and solid component remains <6 mm, annual CT is recommended until 5 years of stability has been established. If persistent these nodules should be considered highly suspicious if the solid component of the nodule is 6 mm or greater in size and enlarging. This recommendation follows the consensus statement: Guidelines for Management of Incidental Pulmonary Nodules Detected on CT Images: From the Fleischner Society 2017; Radiology 2017; 284:228-243. 4. Multi chamber cardiomegaly.  Small pericardial effusion. 5. Compression fracture of T12 has progressed in severity from March 2021 CT, now with posterior cortex involvement. Unchanged T10 and T11 vertebral body compression fractures. Aortic Atherosclerosis (ICD10-I70.0). Electronically Signed   By: Keith Rake M.D.   On: 09/05/2019 23:22   DG Chest Portable 1 View  Result Date: 09/03/2019 CLINICAL DATA:  Increasing shortness of breath over the past 7 days. EXAM: PORTABLE CHEST 1 VIEW COMPARISON:  PA and lateral chest 03/29/2017. FINDINGS: There is cardiomegaly without edema. Lungs are emphysematous. No consolidative process, pneumothorax or effusion. Atherosclerosis noted. IMPRESSION: No acute disease. Cardiomegaly. Aortic Atherosclerosis (ICD10-I70.0) and Emphysema (ICD10-J43.9). Electronically Signed   By: Inge Rise M.D.   On: 09/03/2019 10:07       Subjective: Denies any shortness of breath.  No chest pain.  No nausea or vomiting.  Discharge Exam: Vitals:   09/06/19 1152 09/06/19 1200 09/06/19 1205 09/06/19 1213  BP:  121/68  (!) 110/49  Pulse:  80  86  Resp:  16  16  Temp:    97.8 F (36.6 C)  TempSrc:    Oral  SpO2: 94%  93% 95%  Weight:      Height:        General: Pt is alert, awake, not in acute distress Cardiovascular: RRR, S1/S2 +, no rubs, no gallops Respiratory: CTA bilaterally, no wheezing, no  rhonchi Abdominal: Soft, NT, ND, bowel sounds + Extremities: no edema, no cyanosis    The results of significant diagnostics from this hospitalization (including imaging, microbiology, ancillary and laboratory) are listed below for reference.     Microbiology: Recent Results (from the past 240 hour(s))  SARS CORONAVIRUS 2 (TAT 6-24 HRS) Nasopharyngeal Nasopharyngeal Swab     Status: None   Collection Time: 09/03/19  9:31 AM   Specimen: Nasopharyngeal Swab  Result Value Ref Range Status   SARS Coronavirus 2 NEGATIVE NEGATIVE Final    Comment: (NOTE) SARS-CoV-2 target nucleic acids are NOT DETECTED.  The SARS-CoV-2 RNA is generally detectable in upper and lower respiratory specimens during the acute phase of infection. Negative results do not preclude SARS-CoV-2 infection, do not rule out co-infections with other pathogens, and should not be used as the sole basis for treatment or other patient management decisions. Negative results must be combined with clinical observations, patient history, and epidemiological information. The expected result is Negative.  Fact Sheet for Patients: SugarRoll.be  Fact Sheet for Healthcare Providers: https://www.woods-mathews.com/  This test is not yet approved or cleared by the [place] FDA  and  has been authorized for detection and/or diagnosis of SARS-CoV-2 by FDA under an Emergency Use Authorization (EUA). This EUA will remain  in effect (meaning this test can be used) for the duration of the COVID-19 declaration under Se ction 564(b)(1) of the Act, 21 U.S.C. section 360bbb-3(b)(1), unless the authorization is terminated or revoked sooner.  Performed at [facility], [place] [address], [place], [place] [address]   MRSA PCR Screening     Status: None   Collection Time: 09/04/19  3:21 PM   Specimen: Nasal Mucosa; Nasopharyngeal  Result Value Ref Range Status   MRSA by PCR NEGATIVE  NEGATIVE Final    Comment:        The GeneXpert MRSA Assay (FDA approved for NASAL specimens only), is one component of a comprehensive MRSA colonization surveillance program. It is not intended to diagnose MRSA infection nor to guide or monitor treatment for MRSA infections. Performed at [facility], [address] [place], [place] [address]      Labs: BNP (last 3 results) Recent Labs    09/03/19 0955  BNP 431.5*   Basic Metabolic Panel: Recent Labs  Lab 09/03/19 0955 09/03/19 1206 09/04/19 0754 09/05/19 0605 09/06/19 0801  NA 136  --  135 137 131*  K 2.7*  --  4.4 4.1 4.1  CL 97*  --  100 100 98  CO2 27  --  25 26 20*  GLUCOSE 100*  --  92 110* 92  BUN 11  --  11 14 16   CREATININE 1.43*  --  1.51* 1.58* 1.66*  CALCIUM 8.6*  --  8.7* 9.1 8.6*  MG  --  2.2  --   --   --    Liver Function Tests: Recent Labs  Lab 09/03/19 0955  AST 22  ALT 10  ALKPHOS 123  BILITOT 1.4*  PROT 7.3  ALBUMIN 3.5   No results for input(s): LIPASE, AMYLASE in the last 168 hours. No results for input(s): AMMONIA in the last 168 hours. CBC: Recent Labs  Lab 09/03/19 0955 09/04/19 0754 09/05/19 0605 09/06/19 0801  WBC 6.7 6.8 7.1 7.2  NEUTROABS 4.8  --   --   --   HGB 8.9* 8.4* 8.2* 8.1*  HCT 28.1* 27.3* 26.8* 26.7*  MCV 90.1 90.7 91.5 90.8  PLT 229 233 238 260   Cardiac Enzymes: No results for input(s): CKTOTAL, CKMB, CKMBINDEX, TROPONINI in the last 168 hours. BNP: Invalid input(s): POCBNP CBG: Recent Labs  Lab 09/04/19 0818  GLUCAP 89   D-Dimer No results for input(s): DDIMER in the last 72 hours. Hgb A1c No results for input(s): HGBA1C in the last 72 hours. Lipid Profile No results for input(s): CHOL, HDL, LDLCALC, TRIG, CHOLHDL, LDLDIRECT in the last 72 hours. Thyroid function studies No results for input(s): TSH, T4TOTAL, T3FREE, THYROIDAB in the last 72 hours.  Invalid input(s): FREET3 Anemia work up No results for input(s): VITAMINB12, FOLATE,  FERRITIN, TIBC, IRON, RETICCTPCT in the last 72 hours. Urinalysis    Component Value Date/Time   COLORURINE STRAW (A) 05/23/2019 0914   APPEARANCEUR CLEAR 05/23/2019 0914   LABSPEC 1.004 (L) 05/23/2019 0914   PHURINE 7.0 05/23/2019 0914   GLUCOSEU NEGATIVE 05/23/2019 0914   HGBUR NEGATIVE 05/23/2019 0914   BILIRUBINUR NEGATIVE 05/23/2019 0914   KETONESUR NEGATIVE 05/23/2019 0914   PROTEINUR NEGATIVE 05/23/2019 0914   NITRITE NEGATIVE 05/23/2019 0914   LEUKOCYTESUR NEGATIVE 05/23/2019 0914   Sepsis Labs Invalid input(s): PROCALCITONIN,  WBC,  Nuevo Microbiology Recent Results (from the past 240 hour(s))  SARS CORONAVIRUS 2 (TAT 6-24 HRS) Nasopharyngeal Nasopharyngeal Swab     Status: None   Collection Time: 09/03/19  9:31 AM   Specimen: Nasopharyngeal Swab  Result Value Ref Range Status   SARS Coronavirus 2 NEGATIVE NEGATIVE Final    Comment: (NOTE) SARS-CoV-2 target nucleic acids are NOT DETECTED.  The SARS-CoV-2 RNA is generally detectable in upper and lower respiratory specimens during the acute phase of infection. Negative results do not preclude SARS-CoV-2 infection, do not rule out co-infections with other pathogens, and should not be used as the sole basis for treatment or other patient management decisions. Negative results must be combined with clinical observations, patient history, and epidemiological information. The expected result is Negative.  Fact Sheet for Patients: SugarRoll.be  Fact Sheet for Healthcare Providers: https://www.woods-mathews.com/  This test is not yet approved or cleared by the [place] FDA and  has been authorized for detection and/or diagnosis of SARS-CoV-2 by FDA under an Emergency Use Authorization (EUA). This EUA will remain  in effect (meaning this test can be used) for the duration of the COVID-19 declaration under Se ction 564(b)(1) of the Act, 21 U.S.C. section 360bbb-3(b)(1),  unless the authorization is terminated or revoked sooner.  Performed at [facility], [place] [address], [place], [place] [address]   MRSA PCR Screening     Status: None   Collection Time: 09/04/19  3:21 PM   Specimen: Nasal Mucosa; Nasopharyngeal  Result Value Ref Range Status   MRSA by PCR NEGATIVE NEGATIVE Final    Comment:        The GeneXpert MRSA Assay (FDA approved for NASAL specimens only), is one component of a comprehensive MRSA colonization surveillance program. It is not intended to diagnose MRSA infection nor to guide or monitor treatment for MRSA infections. Performed at [name], [address], [place], [place] [address]      Time coordinating discharge: 79mins  SIGNED:   Kathie Dike, MD  [facility] 09/06/2019, 9:12 PM   If 7PM-7AM, please contact night-coverage [email or web address]

## 2019-09-06 NOTE — Transfer of Care (Signed)
Immediate Anesthesia Transfer of Care Note  Patient: Bailey Bishop  Procedure(s) Performed: COLONOSCOPY WITH PROPOFOL (N/A ) ESOPHAGOGASTRODUODENOSCOPY (EGD) WITH PROPOFOL (N/A ) ESOPHAGEAL DILATION POLYPECTOMY  Patient Location: PACU  Anesthesia Type:General  Level of Consciousness: awake, alert , oriented and sedated  Airway & Oxygen Therapy: Patient Spontanous Breathing and Patient connected to nasal cannula oxygen  Post-op Assessment: Report given to RN and Post -op Vital signs reviewed and stable  Post vital signs: Reviewed and stable  Last Vitals:  Vitals Value Taken Time  BP 94/82   Temp 97.6   Pulse 71   Resp 21   SpO2 97     Last Pain:  Vitals:   09/06/19 1026  TempSrc: Oral  PainSc:          Complications: No complications documented.

## 2019-09-06 NOTE — Anesthesia Postprocedure Evaluation (Signed)
Anesthesia Post Note  Patient: Bailey Bishop  Procedure(s) Performed: COLONOSCOPY WITH PROPOFOL (N/A ) ESOPHAGOGASTRODUODENOSCOPY (EGD) WITH PROPOFOL (N/A ) ESOPHAGEAL DILATION POLYPECTOMY  Patient location during evaluation: PACU Anesthesia Type: General Level of consciousness: awake, awake and alert, oriented and sedated Pain management: pain level controlled Vital Signs Assessment: post-procedure vital signs reviewed and stable Respiratory status: spontaneous breathing and respiratory function stable Cardiovascular status: blood pressure returned to baseline and stable Postop Assessment: no apparent nausea or vomiting Anesthetic complications: no   No complications documented.   Last Vitals:  Vitals:   09/06/19 1200 09/06/19 1205  BP: 121/68   Pulse: 80   Resp: 16   Temp:    SpO2:  93%    Last Pain:  Vitals:   09/06/19 1150  TempSrc:   PainSc: 0-No pain                 Anastasija Anfinson C Tiah Heckel

## 2019-09-06 NOTE — Anesthesia Preprocedure Evaluation (Addendum)
Anesthesia Evaluation  Patient identified by MRN, date of birth, ID band Patient awake    Reviewed: Allergy & Precautions, NPO status , Patient's Chart, lab work & pertinent test results  History of Anesthesia Complications Negative for: history of anesthetic complications  Airway Mallampati: II  TM Distance: >3 FB Neck ROM: Full    Dental  (+) Edentulous Lower, Edentulous Upper   Pulmonary shortness of breath, with exertion and at rest, asthma , COPD, former smoker,    breath sounds clear to auscultation       Cardiovascular Exercise Tolerance: Poor hypertension, Pt. on medications + DOE  + dysrhythmias Atrial Fibrillation  Rhythm:Irregular Rate:Abnormal  03-Sep-2019 09:08:10 [facility] ROUTINE RECORD Atrial fibrillation Paired ventricular premature complexes Left axis deviation Anterior infarct, old Artifact in lead(s) I II III aVR aVL aVF V1 V2 V4 V5 Confirmed by Virgel Manifold [phone]) on 09/03/2019 9:35:52 AM Left ventricle: The cavity size was normal. Wall thickness was  normal. Systolic function was normal. The estimated ejection  fraction was in the range of 55% to 60%. Wall motion was normal;  there were no regional wall motion abnormalities.  - Aortic valve: Mildly to moderately calcified annulus. Mildly  thickened leaflets. Valve area (VTI): 2.92 cm^2. Valve area  (Vmax): 2.73 cm^2. Valve area (Vmean): 2.61 cm^2.  - Mitral valve: There was moderate regurgitation.  - Left atrium: The atrium was severely dilated.  - Right atrium: The atrium was severely dilated.  - Atrial septum: No defect or patent foramen ovale was identified.  - Pulmonary arteries: Systolic pressure was moderately increased.  PA peak pressure: 49 mm Hg (S).  - Pericardium, extracardiac: There is a small circumferential  pericardial effusion.  - Technically adequate study.    Neuro/Psych Anxiety     GI/Hepatic Bowel prep,GERD  Medicated,  Endo/Other    Renal/GU Renal InsufficiencyRenal disease     Musculoskeletal  (+) Arthritis , Osteoarthritis,    Abdominal   Peds  Hematology  (+) anemia ,   Anesthesia Other Findings   Reproductive/Obstetrics                             Anesthesia Physical Anesthesia Plan  ASA: IV  Anesthesia Plan: General   Post-op Pain Management:    Induction: Intravenous  PONV Risk Score and Plan: 3 and TIVA and Ondansetron  Airway Management Planned: Nasal Cannula, Natural Airway and Simple Face Mask  Additional Equipment:   Intra-op Plan:   Post-operative Plan: Possible Post-op intubation/ventilation  Informed Consent: I have reviewed the patients History and Physical, chart, labs and discussed the procedure including the risks, benefits and alternatives for the proposed anesthesia with the patient or authorized representative who has indicated his/her understanding and acceptance.       Plan Discussed with: Surgeon  Anesthesia Plan Comments:         Anesthesia Quick Evaluation

## 2019-09-06 NOTE — Op Note (Addendum)
[facility] Patient Name: Bailey Bishop Procedure Date: 09/06/2019 9:39 AM MRN: [id number] Date of Birth: 09-23-1940 Attending MD: Norvel Richards , MD CSN: [id number] Age: 79 Admit Type: Inpatient Procedure:                Upper GI endoscopy Indications:              Dysphagia Providers:                Norvel Richards, MD, Janeece Riggers, RN, Lurline Del, RN Referring MD:              Medicines:                Propofol per Anesthesia Complications:            No immediate complications. Estimated Blood Loss:     Estimated blood loss was minimal. Procedure:                Pre-Anesthesia Assessment:                           - Prior to the procedure, a History and Physical                            was performed, and patient medications and                            allergies were reviewed. The patient's tolerance of                            previous anesthesia was also reviewed. The risks                            and benefits of the procedure and the sedation                            options and risks were discussed with the patient.                            All questions were answered, and informed consent                            was obtained. Prior Anticoagulants: The patient has                            taken no previous anticoagulant or antiplatelet                            agents. ASA Grade Assessment: III - A patient with                            severe systemic disease. After reviewing the risks  and benefits, the patient was deemed in                            satisfactory condition to undergo the procedure.                           After obtaining informed consent, the endoscope was                            passed under direct vision. Throughout the                            procedure, the patient's blood pressure, pulse, and                            oxygen saturations were monitored  continuously. The                            [id number]-H190 ([id number]) scope was introduced through the                            mouth, and advanced to the second part of duodenum.                            The upper GI endoscopy was accomplished without                            difficulty. The patient tolerated the procedure                            well. Scope In: 10:48:42 AM Scope Out: 10:58:22 AM Total Procedure Duration: 0 hours 9 minutes 40 seconds  Findings:      A mild Schatzki ring was found at the gastroesophageal junction. No       esophagitis or mass seen      Multiple erosions with no stigmata of recent bleeding were found in the       entire examined stomach. No ulcer or infiltrating process.      The duodenal bulb and second portion of the duodenum were normal. The       scope was withdrawn. Dilation was performed with a Maloney dilator with       no resistance at 4 Fr. The scope was withdrawn. Dilation was performed       with a Maloney dilator with mild resistance at 56 Fr. The dilation site       was examined following endoscope reinsertion and showed no change.       Estimated blood loss: none. Finally, the abnormal gastric mucosa was       biopsied with a cold forceps for histology. Estimated blood loss was       minimal. Estimated blood loss was minimal. Impression:               - Mild Schatzki ring. Dilated.                           - Erosive gastropathy with no stigmata of recent  bleeding. Biopsied.                           - Normal duodenal bulb and second portion of the                            duodenum. Moderate Sedation:      Moderate (conscious) sedation was personally administered by an       anesthesia professional. The following parameters were monitored: oxygen       saturation, heart rate, blood pressure, respiratory rate, EKG, adequacy       of pulmonary ventilation, and response to care. Recommendation:           -  Patient has a contact number available for                            emergencies. The signs and symptoms of potential                            delayed complications were discussed with the                            patient. Return to normal activities tomorrow.                            Written discharge instructions were provided to the                            patient.                           - Advance diet as tolerated. Increase omeprazole to                            40 mg daily at discharge. Patient should take every                            day.                           - Continue present medications.                           - Await pathology results.                           - Return to my office in 6 weeks. See colonoscopy                            report. Procedure Code(s):        --- Professional ---                           [phone], Esophagogastroduodenoscopy, flexible,                            transoral; with biopsy, single or multiple  43450, Dilation of esophagus, by unguided sound or                            bougie, single or multiple passes Diagnosis Code(s):        --- Professional ---                           K22.2, Esophageal obstruction                           K31.89, Other diseases of stomach and duodenum                           R13.10, Dysphagia, unspecified CPT copyright 2019 [employer]. All rights reserved. The codes documented in this report are preliminary and upon coder review may  be revised to meet current compliance requirements. Cristopher Estimable. Merlene Dante, MD Norvel Richards, MD 09/06/2019 11:05:06 AM This report has been signed electronically. Number of Addenda: 0

## 2019-09-06 NOTE — Plan of Care (Signed)

## 2019-09-06 NOTE — Progress Notes (Signed)
Patient was able to finish half of her Golytely, stool is currently clear yellow with some sediment.  Patient has been NPO since midnight.

## 2019-09-06 NOTE — Op Note (Addendum)
[facility] Patient Name: Bailey Bishop Procedure Date: 09/06/2019 11:03 AM MRN: [id number] Date of Birth: 12-13-1940 Attending MD: Norvel Richards , MD CSN: [id number] Age: 79 Admit Type: Inpatient Procedure:                Colonoscopy Indications:              Hematochezia Providers:                Norvel Richards, MD, Janeece Riggers, RN, Lurline Del, RN Referring MD:              Medicines:                Propofol per Anesthesia Complications:            No immediate complications. Estimated Blood Loss:     Estimated blood loss was minimal. Estimated blood                            loss was minimal. Procedure:                Pre-Anesthesia Assessment:                           - Prior to the procedure, a History and Physical                            was performed, and patient medications and                            allergies were reviewed. The patient's tolerance of                            previous anesthesia was also reviewed. The risks                            and benefits of the procedure and the sedation                            options and risks were discussed with the patient.                            All questions were answered, and informed consent                            was obtained. Prior Anticoagulants: The patient has                            taken no previous anticoagulant or antiplatelet                            agents. ASA Grade Assessment: III - A patient with                            severe systemic  disease. After reviewing the risks                            and benefits, the patient was deemed in                            satisfactory condition to undergo the procedure.                           After obtaining informed consent, the colonoscope                            was passed under direct vision. Throughout the                            procedure, the patient's blood pressure, pulse, and                             oxygen saturations were monitored continuously. The                            CF-HQ190L ([id number]) scope was introduced through                            the anus and advanced to the the cecum, identified                            by appendiceal orifice and ileocecal valve. The                            colonoscopy was performed without difficulty. The                            patient tolerated the procedure well. The quality                            of the bowel preparation was adequate. Scope In: 11:07:18 AM Scope Out: 11:40:10 AM Scope Withdrawal Time: 0 hours 25 minutes 8 seconds  Total Procedure Duration: 0 hours 32 minutes 52 seconds  Findings:      The perianal and digital rectal examinations were normal.      Scattered medium-mouthed diverticula were found in the sigmoid colon and       descending colon.      Eight semi-pedunculated polyps were found in the sigmoid colon,       descending colon, hepatic flexure and cecum. The polyps were 4 to 8 mm       in size. These polyps were removed with a cold snare. Resection and       retrieval were complete. Estimated blood loss was minimal.      A 13 mm polyp was found in the descending colon. The polyp was       pedunculated. The polyp was removed with a hot snare. Resection and       retrieval were complete. Estimated blood loss: none.      The exam was otherwise without abnormality. Impression:               -  Diverticulosis in the sigmoid colon and in the                            descending colon.                           - Eight 4 to 8 mm polyps in the sigmoid colon, in                            the descending colon, at the hepatic flexure and in                            the cecum, removed with a cold snare. Resected and                            retrieved.                           - One 13 mm polyp in the descending colon, removed                            with a hot snare. Resected and  retrieved.                           - The examination was otherwise normal. Moderate Sedation:      Moderate (conscious) sedation was personally administered by an       anesthesia professional. The following parameters were monitored: oxygen       saturation, heart rate, blood pressure, respiratory rate, EKG, adequacy       of pulmonary ventilation, and response to care. Recommendation:           - Advance diet as tolerated.                           - Continue present medications. If anticoagulation                            is to be continued, would not resume until 09/13/2019                           - Repeat colonoscopy (date not yet determined) for                            surveillance based on pathology results.                           - Return patient to hospital ward for observation.                           - Advance diet as tolerated.                           - Continue present medications.                           -  Return to my office (date not yet determined).                            See EGD report. I Have discussed my fingings and                            recommendations at lenght with pts husband in short                            stay. Procedure Code(s):        --- Professional ---                           [phone], Colonoscopy, flexible; with removal of                            tumor(s), polyp(s), or other lesion(s) by snare                            technique Diagnosis Code(s):        --- Professional ---                           K63.5, Polyp of colon                           K92.1, Melena (includes Hematochezia)                           K57.30, Diverticulosis of large intestine without                            perforation or abscess without bleeding CPT copyright 2019 American [employer] Association. All rights reserved. The codes documented in this report are preliminary and upon coder review may  be revised to meet current compliance  requirements. Cristopher Estimable. Ruben Mahler, MD Norvel Richards, MD 09/06/2019 11:51:03 AM This report has been signed electronically. Number of Addenda: 0

## 2019-09-06 NOTE — Interval H&P Note (Signed)
History and Physical Interval Note:  09/06/2019 10:37 AM  Bailey Bishop  has presented today for surgery, with the diagnosis of dysphagia; gastrointestinal bleed,.  The various methods of treatment have been discussed with the patient and family. After consideration of risks, benefits and other options for treatment, the patient has consented to  Procedure(s): COLONOSCOPY WITH PROPOFOL (N/A) ESOPHAGOGASTRODUODENOSCOPY (EGD) WITH PROPOFOL (N/A) as a surgical intervention.  The patient's history has been reviewed, patient examined, no change in status, stable for surgery.  I have reviewed the patient's chart and labs.  Questions were answered to the patient's satisfaction.     Manus Rudd   Patient seen in short stay.  She was able to take the other half for prep.  She is agreeable to EGD with possible esophageal dilation and colonoscopy per plan. The risks, benefits, limitations, imponderables and alternatives regarding both EGD and colonoscopy have been reviewed with the patient. Questions have been answered. All parties agreeable.

## 2019-09-07 ENCOUNTER — Encounter (HOSPITAL_COMMUNITY): Payer: Self-pay | Admitting: Internal Medicine

## 2019-09-08 ENCOUNTER — Encounter: Payer: Self-pay | Admitting: Internal Medicine

## 2019-09-08 ENCOUNTER — Other Ambulatory Visit: Payer: Self-pay

## 2019-09-08 ENCOUNTER — Telehealth: Payer: Self-pay | Admitting: *Deleted

## 2019-09-08 LAB — SURGICAL PATHOLOGY

## 2019-09-08 NOTE — Telephone Encounter (Signed)
Called patient.  No answer.  LM on machine for pt to call back.

## 2019-09-08 NOTE — Telephone Encounter (Signed)
Please give pt a call @ [phone]

## 2019-09-09 NOTE — Telephone Encounter (Signed)
Called and spoke with patient.  She just wanted to update me on her hospitalization last week.

## 2019-09-10 ENCOUNTER — Emergency Department (HOSPITAL_COMMUNITY): Payer: Medicare Other

## 2019-09-10 ENCOUNTER — Emergency Department (HOSPITAL_COMMUNITY)
Admission: EM | Admit: 2019-09-10 | Discharge: 2019-09-10 | Disposition: A | Discharge status: Home / Self Care | Payer: Medicare Other | Attending: Emergency Medicine | Admitting: Emergency Medicine

## 2019-09-10 ENCOUNTER — Telehealth: Payer: Self-pay | Admitting: Cardiovascular Disease

## 2019-09-10 ENCOUNTER — Other Ambulatory Visit: Payer: Self-pay

## 2019-09-10 ENCOUNTER — Encounter (HOSPITAL_COMMUNITY): Payer: Self-pay | Admitting: *Deleted

## 2019-09-10 DIAGNOSIS — N1832 Chronic kidney disease, stage 3b: Secondary | ICD-10-CM | POA: Diagnosis not present

## 2019-09-10 DIAGNOSIS — I129 Hypertensive chronic kidney disease with stage 1 through stage 4 chronic kidney disease, or unspecified chronic kidney disease: Secondary | ICD-10-CM | POA: Insufficient documentation

## 2019-09-10 DIAGNOSIS — Z853 Personal history of malignant neoplasm of breast: Secondary | ICD-10-CM | POA: Diagnosis not present

## 2019-09-10 DIAGNOSIS — Z7951 Long term (current) use of inhaled steroids: Secondary | ICD-10-CM | POA: Diagnosis not present

## 2019-09-10 DIAGNOSIS — Z87891 Personal history of nicotine dependence: Secondary | ICD-10-CM | POA: Insufficient documentation

## 2019-09-10 DIAGNOSIS — Z86010 Personal history of colonic polyps: Secondary | ICD-10-CM | POA: Insufficient documentation

## 2019-09-10 DIAGNOSIS — J449 Chronic obstructive pulmonary disease, unspecified: Secondary | ICD-10-CM | POA: Diagnosis not present

## 2019-09-10 DIAGNOSIS — J90 Pleural effusion, not elsewhere classified: Secondary | ICD-10-CM | POA: Diagnosis not present

## 2019-09-10 DIAGNOSIS — R600 Localized edema: Secondary | ICD-10-CM | POA: Diagnosis not present

## 2019-09-10 DIAGNOSIS — J45909 Unspecified asthma, uncomplicated: Secondary | ICD-10-CM | POA: Diagnosis not present

## 2019-09-10 DIAGNOSIS — Z79899 Other long term (current) drug therapy: Secondary | ICD-10-CM | POA: Insufficient documentation

## 2019-09-10 DIAGNOSIS — J189 Pneumonia, unspecified organism: Secondary | ICD-10-CM

## 2019-09-10 DIAGNOSIS — R0602 Shortness of breath: Secondary | ICD-10-CM | POA: Diagnosis not present

## 2019-09-10 DIAGNOSIS — I517 Cardiomegaly: Secondary | ICD-10-CM | POA: Diagnosis not present

## 2019-09-10 LAB — CBC WITH DIFFERENTIAL/PLATELET
Abs Immature Granulocytes: 0.02 10*3/uL (ref 0.00–0.07)
Basophils Absolute: 0 10*3/uL (ref 0.0–0.1)
Basophils Relative: 0 %
Eosinophils Absolute: 0.1 10*3/uL (ref 0.0–0.5)
Eosinophils Relative: 2 %
HCT: 27.3 % — ABNORMAL LOW (ref 36.0–46.0)
Hemoglobin: 8.5 g/dL — ABNORMAL LOW (ref 12.0–15.0)
Immature Granulocytes: 0 %
Lymphocytes Relative: 11 %
Lymphs Abs: 0.9 10*3/uL (ref 0.7–4.0)
MCH: 27.5 pg (ref 26.0–34.0)
MCHC: 31.1 g/dL (ref 30.0–36.0)
MCV: 88.3 fL (ref 80.0–100.0)
Monocytes Absolute: 0.8 10*3/uL (ref 0.1–1.0)
Monocytes Relative: 10 %
Neutro Abs: 6.1 10*3/uL (ref 1.7–7.7)
Neutrophils Relative %: 77 %
Platelets: 334 10*3/uL (ref 150–400)
RBC: 3.09 MIL/uL — ABNORMAL LOW (ref 3.87–5.11)
RDW: 15 % (ref 11.5–15.5)
WBC: 8 10*3/uL (ref 4.0–10.5)
nRBC: 0 % (ref 0.0–0.2)

## 2019-09-10 LAB — BASIC METABOLIC PANEL
Anion gap: 12 (ref 5–15)
BUN: 15 mg/dL (ref 8–23)
CO2: 26 mmol/L (ref 22–32)
Calcium: 8.5 mg/dL — ABNORMAL LOW (ref 8.9–10.3)
Chloride: 94 mmol/L — ABNORMAL LOW (ref 98–111)
Creatinine, Ser: 1.65 mg/dL — ABNORMAL HIGH (ref 0.44–1.00)
GFR calc Af Amer: 34 mL/min — ABNORMAL LOW (ref >60)
GFR calc non Af Amer: 29 mL/min — ABNORMAL LOW (ref >60)
Glucose, Bld: 105 mg/dL — ABNORMAL HIGH (ref 70–99)
Potassium: 3.6 mmol/L (ref 3.5–5.1)
Sodium: 132 mmol/L — ABNORMAL LOW (ref 135–145)

## 2019-09-10 LAB — BRAIN NATRIURETIC PEPTIDE: B Natriuretic Peptide: 848 pg/mL — ABNORMAL HIGH (ref 0.0–100.0)

## 2019-09-10 LAB — TROPONIN I (HIGH SENSITIVITY): Troponin I (High Sensitivity): 12 ng/L (ref <18)

## 2019-09-10 MED ORDER — AZITHROMYCIN 250 MG PO TABS
250.0000 mg | ORAL_TABLET | Freq: Every day | ORAL | 0 refills | Status: DC
Start: 2019-09-10 — End: 2019-09-24

## 2019-09-10 MED ORDER — AZITHROMYCIN 250 MG PO TABS
500.0000 mg | ORAL_TABLET | Freq: Once | ORAL | Status: AC
  Administered 2019-09-10: 500 mg via ORAL
  Filled 2019-09-10: qty 2

## 2019-09-10 NOTE — Telephone Encounter (Signed)
Pt states she is SOB  Please caLL [phone]   Thanks renee

## 2019-09-10 NOTE — Telephone Encounter (Signed)
Spoke with Pt who states that she is SOB. Pt was admitted in hospital on Thurs. And discharged on Wed. Pt's SOB is worse on today. Pt has lower extremity swelling. She is unable to walk 10 without becoming very SOB stated by husband. Today's weight is 148 lbs. Husband states that SOB was not addressed during hospital stay. Pt instructed to be seen in ER. Husband and pt wanted to await recombinations of [place], [place].

## 2019-09-10 NOTE — ED Triage Notes (Signed)
Pt with continued SOB, recent admission.  Pt with hx of Afib as well.

## 2019-09-10 NOTE — Discharge Instructions (Signed)
Begin taking Zithromax as prescribed.  Increase your dose of Lasix to 40 mg daily for the next 5 days.  Follow-up with pulmonology for evaluation of your lung function.  The contact information for the pulmonology clinic has been provided in this discharge summary for you to call and make these arrangements.

## 2019-09-10 NOTE — Telephone Encounter (Signed)
     I agree with the ED evaluation given her significant dyspnea. By review of notes, she was admitted for anemia and this can cause shortness of breath. Given her significant change, she should go back to the emergency department as they will need to obtain repeat labs and reassess her hemoglobin along with performing further work-up of her dyspnea.  Signed, Erma Heritage, PA-C 09/10/2019, 1:33 PM Pager: [phone]

## 2019-09-10 NOTE — Telephone Encounter (Signed)
Returned call to pt. Informed husband pt should be seen in ER. Pt's husband voiced understanding.

## 2019-09-10 NOTE — ED Provider Notes (Signed)
[facility] EMERGENCY DEPARTMENT Provider Note   CSN: [id number] Arrival date & time: 09/10/19  1403     History Chief Complaint  Patient presents with  . Shortness of Breath    Bailey Bishop is a 79 y.o. female.  Patient is an 79 year old female with past medical history of atrial fibrillation on amiodarone and anticoagulation, hypertension, asthma.  Patient recently admitted for shortness of breath felt to be related to a drop in her hemoglobin which was unexplained by colonoscopy and endoscopy.  Patient states she was discharged several days ago without a full explanation as to why her breathing was worse.  She tells me she feels just as bad as when she was admitted.  She has difficulty at night sleeping and has to sit on the edge of the bed to catch her breath on several occasions through the night.  She denies chest pain, fevers, chills, or productive cough.  She does express what seems like dyspnea on exertion.  The history is provided by the patient.  Shortness of Breath Severity:  Moderate Onset quality:  Sudden Duration:  1 week Timing:  Constant Progression:  Worsening Chronicity:  New Relieved by:  Nothing Worsened by:  Exertion and movement (Lying flat) Ineffective treatments:  None tried Associated symptoms: no fever and no sputum production        Past Medical History:  Diagnosis Date  . Anxiety   . Arthritis   . Asthma   . Atrial fibrillation ([place])    a. s/p unsuccessful DCCV in 01/2017 and intolerant to Amiodarone --> Rate-control strategy pursued since b. failed Tikosyn in 11/2017  . Cancer [facility]) 2001   right breast  . Dyspnea   . GERD (gastroesophageal reflux disease)   . Hypertension   . T10 vertebral fracture [facility])     Patient Active Problem List   Diagnosis Date Noted  . GI bleed 09/05/2019  . Symptomatic anemia 09/03/2019  . CKD (chronic kidney disease), stage IIIb 09/03/2019  . Iron deficiency anemia   . Benign neoplasm of transverse  colon   . Diverticulosis of large intestine without diverticulitis   . Visit for monitoring Tikosyn therapy 12/02/2017  . Atrial fibrillation ([place]) 03/31/2017  . Persistent atrial fibrillation ([place])   . Anticoagulated 10/03/2016  . COPD (chronic obstructive pulmonary disease) ([place]) 10/03/2016  . Hypokalemia 09/24/2016  . Asthma in adult without complication 36/62/9476  . Hyperglycemia 09/23/2016  . Anxiety   . GERD (gastroesophageal reflux disease)   . Shortness of breath   . Essential hypertension   . Status asthmaticus 07/07/2014    Past Surgical History:  Procedure Laterality Date  . BIOPSY N/A 04/22/2018   Procedure: BIOPSY;  Surgeon: Aviva Signs, MD;  Location: [facility] ENDO [facility];  Service: Gastroenterology;  Laterality: N/A;  . BIOPSY  09/06/2019   Procedure: BIOPSY;  Surgeon: Daneil Dolin, MD;  Location: [facility] END[facility];  Service: Endoscopy;;  gastric  . CARDIOVERSION N/A 02/18/2017   Procedure: CARDIOVERSION;  Surgeon: Josue Hector, MD;  Location: [facility] ENDOSCOPY;  Service: Cardiovascular;  Laterality: N/A;  . CARDIOVERSION N/A 12/04/2017   Procedure: CARDIOVERSION;  Surgeon: [name], MD;  Location: [place];  Service: Cardiovascular;  Laterality: N/A;  . CATARACT EXTRACTION W/PHACO  07/23/2011   Procedure: CATARACT EXTRACTION PHACO AND INTRAOCULAR LENS PLACEMENT ([place]);  Surgeon: Williams Che, MD;  Location: [facility] ORS;  Service: Ophthalmology;  Laterality: Right;  CDE:9.96  . CATARACT EXTRACTION W/PHACO Left 11/13/2018   Procedure: CATARACT EXTRACTION PHACO  AND INTRAOCULAR LENS PLACEMENT (IOC);  Surgeon: Baruch Goldmann, MD;  Location: [facility] ORS;  Service: Ophthalmology;  Laterality: Left;  left, CDE: 7.44  . CHOLECYSTECTOMY N/A 02/14/2015   Procedure: LAPAROSCOPIC CHOLECYSTECTOMY;  Surgeon: Aviva Signs, MD;  Location: [facility] ORS;  Service: General;  Laterality: N/A;  . COLONOSCOPY N/A 04/22/2018   Procedure: COLONOSCOPY;  Surgeon: Aviva Signs, MD;  Location[facility] END[facility];  Service: Gastroenterology;  Laterality: N/A;  . COLONOSCOPY WITH PROPOFOL N/A 09/06/2019   Procedure: COLONOSCOPY WITH PROPOFOL;  Surgeon: Daneil Dolin, MD;  [facility];  Service: Endoscopy;  Laterality: N/A;  . ESOPHAGEAL DILATION  09/06/2019   Procedure: ESOPHAGEAL DILATION;  Surgeon: Daneil Dolin, MD;  [facility];  Service: Endoscopy;;  . ESOPHAGOGASTRODUODENOSCOPY N/A 04/22/2018   Procedure: ESOPHAGOGASTRODUODENOSCOPY (EGD);  Surgeon: Aviva Signs, MD;  [facility];  Service: Gastroenterology;  Laterality: N/A;  . ESOPHAGOGASTRODUODENOSCOPY (EGD) WITH PROPOFOL N/A 09/06/2019   Procedure: ESOPHAGOGASTRODUODENOSCOPY (EGD) WITH PROPOFOL;  Surgeon: Daneil Dolin, MD;  [facility];  Service: Endoscopy;  Laterality: N/A;  . MASTECTOMY     bilateral mastectomy-right breast cancer-left taken by choice  . POLYPECTOMY  04/22/2018   Procedure: POLYPECTOMY;  Surgeon: Aviva Signs, MD;  [facility];  Service: Gastroenterology;;  . POLYPECTOMY  09/06/2019   Procedure: POLYPECTOMY;  Surgeon: Daneil Dolin, MD;  [facility];  Service: Endoscopy;;  cecal, ascending, hepatic flexure, sigmoid  . SEPTOPLASTY  1995     OB History   No obstetric history on file.     Family History  Problem Relation Age of Onset  . Cancer Mother   . Aneurysm Father   . Cancer Sister   . Cancer Sister   . Anesthesia problems Neg Hx   . Hypotension Neg Hx   . Malignant hyperthermia Neg Hx   . Pseudochol deficiency Neg Hx   . Colon cancer Neg Hx     Social History   Tobacco Use  . Smoking status: Former Smoker    Packs/day: 1.00    Years: 28.00    Pack years: 28.00    Types: Cigarettes    Quit date: 07/17/1983    Years since quitting: 36.1  . Smokeless tobacco: Never Used  Vaping Use  . Vaping Use: Never used  Substance Use Topics  . Alcohol use: No  . Drug use: No    Home Medications Prior to Admission medications    Medication Sig Start Date End Date Taking? Authorizing Provider  acetaminophen (TYLENOL) 650 MG CR tablet Take 650 mg by mouth every 8 (eight) hours as needed for pain.   Yes [provider]  albuterol (VENTOLIN HFA) 108 (90 Base) MCG/ACT inhaler Inhale 1-2 puffs into the lungs every 6 (six) hours as needed for wheezing or shortness of breath.   Yes [provider]  ALPRAZolam Duanne Moron) 0.5 MG tablet Take 0.5 mg by mouth at bedtime as needed for anxiety.  06/18/14  Yes [provider]  apixaban (ELIQUIS) 5 MG TABS tablet Take 1 tablet (5 mg total) by mouth 2 (two) times daily. Resume on 6/20 09/06/19  Yes Kathie Dike, MD  calcium citrate-vitamin D (CITRACAL+D) 315-200 MG-UNIT tablet Take 1 tablet by mouth 2 (two) times daily.   Yes [provider]  fluticasone (FLONASE) 50 MCG/ACT nasal spray Place 1 spray into both nostrils daily as needed for allergies.  12/26/18  Yes [provider]  furosemide (LASIX) 40 MG  tablet Take 1 tablet (40 mg total) by mouth daily. 08/17/19  Yes Sherran Needs, NP  omeprazole (PRILOSEC) 40 MG capsule Take 1 capsule (40 mg total) by mouth daily. Patient taking differently: Take 40 mg by mouth daily as needed (for acid reflux).  09/06/19  Yes Kathie Dike, MD  potassium chloride SA (KLOR-CON) 20 MEQ tablet Take 1 tablet (20 mEq total) by mouth daily. 04/30/19  Yes Sherran Needs, NP  traZODone (DESYREL) 50 MG tablet Take 50 mg by mouth at bedtime as needed for sleep.  08/19/19  Yes [provider]  verapamil (CALAN) 40 MG tablet Take 1 tablet (40 mg total) by mouth 3 (three) times daily. Patient taking differently: Take 40 mg by mouth daily.  09/02/19  Yes Sherran Needs, NP    Allergies    Cardizem cd [diltiazem hcl er beads], Amiodarone, Metoprolol tartrate, Sulfa antibiotics, Tomato, Carvedilol, and Chocolate  Review of Systems   Review of Systems  Constitutional: Negative for fever.  Respiratory: Positive  for shortness of breath. Negative for sputum production.   All other systems reviewed and are negative.   Physical Exam Updated Vital Signs BP (!) 116/95 (BP Location: Left Arm)   Pulse 86   Temp 98.5 F (36.9 C) (Oral)   Resp 20   Ht 5\' 4"  (1.626 m)   Wt 65.8 kg   SpO2 97%   BMI 24.89 kg/m   Physical Exam Vitals and nursing note reviewed.  Constitutional:      General: She is not in acute distress.    Appearance: She is well-developed. She is not diaphoretic.  HENT:     Head: Normocephalic and atraumatic.  Cardiovascular:     Rate and Rhythm: Normal rate and regular rhythm.     Heart sounds: No murmur heard.  No friction rub. No gallop.   Pulmonary:     Effort: Pulmonary effort is normal. No respiratory distress.     Breath sounds: Normal breath sounds. No wheezing.  Abdominal:     General: Bowel sounds are normal. There is no distension.     Palpations: Abdomen is soft.     Tenderness: There is no abdominal tenderness.  Musculoskeletal:        General: Normal range of motion.     Cervical back: Normal range of motion and neck supple.     Right lower leg: No tenderness. Edema present.     Left lower leg: No tenderness. Edema present.     Comments: There is 1-2+ pitting edema of both lower extremities. No calf tenderness.  Homan's sign absent.  Skin:    General: Skin is warm and dry.  Neurological:     Mental Status: She is alert and oriented to person, place, and time.     ED Results / Procedures / Treatments   Labs (all labs ordered are listed, but only abnormal results are displayed) Labs Reviewed  BASIC METABOLIC PANEL  CBC WITH DIFFERENTIAL/PLATELET  BRAIN NATRIURETIC PEPTIDE  TROPONIN I (HIGH SENSITIVITY)    EKG EKG Interpretation  Date/Time:  Thursday September 10 2019 14:10:10 EDT Ventricular Rate:  87 PR Interval:    QRS Duration: 82 QT Interval:  370 QTC Calculation: 445 R Axis:   -53 Text Interpretation: Atrial fibrillation Left axis deviation  Low voltage QRS Cannot rule out Anterior infarct , age undetermined Abnormal ECG Confirmed by Veryl Speak [phone]) on 09/10/2019 5:02:51 PM   Radiology No results found.  Procedures Procedures (including critical care time)  Medications Ordered in ED Medications - No data to display  ED Course  I have reviewed the triage vital signs and the nursing notes.  Pertinent labs & imaging results that were available during my care of the patient were reviewed by me and considered in my medical decision making (see chart for details).    MDM Rules/Calculators/A&P  Patient presenting here with complaints of shortness of breath.  This has been present for the past week.  She was admitted with similar complaints 1 week ago and found to have a hemoglobin drop of 3 g, but no cause found.  Her work-up today reveals improving hemoglobin.  She has evidence for possible developing pneumonia on her chest x-ray and also a slightly elevated BNP and pedal edema.  Her oxygen saturations are in the upper 90s on room air and she appears clinically well otherwise.  Patient will be treated with Zithromax, and an increased dose of her Lasix.  Patient has been on amiodarone in the past and is concerned that she may have lung damage related to this.  I will place a referral for pulmonology for consideration of pulmonary function testing.  Final Clinical Impression(s) / ED Diagnoses Final diagnoses:  None    Rx / DC Orders ED Discharge Orders    None       Veryl Speak, MD 09/10/19 Valerie Roys

## 2019-09-10 NOTE — ED Notes (Signed)
EDP at bedside  

## 2019-09-14 ENCOUNTER — Telehealth: Payer: Self-pay | Admitting: Cardiovascular Disease

## 2019-09-14 NOTE — Telephone Encounter (Signed)
Patient's husband called stating that patient needs to be seen sooner due to increase shortness of breath.  Has appointment on Friday 6-25 with A. Leonides Sake.

## 2019-09-14 NOTE — Telephone Encounter (Signed)
Pt aware that no sooner appts are available for [place] - aware that if symptoms worsen pt need ED evaluation

## 2019-09-15 ENCOUNTER — Other Ambulatory Visit: Payer: Self-pay | Admitting: *Deleted

## 2019-09-15 NOTE — Patient Outreach (Signed)
[facility]) Care Management  09/15/2019  Bailey Bishop [birth year] [id number]   EMMI-general discharge referral and case closure - Opt out of [facility] services to include EMMI services  RED ON EMMI ALERT Day # 4  Date: Saturday June 19/21 1316 Red Alert Reason: Got discharge papers? No   Insurance: united [employer] medicare   [facility] admissions x 1 ED visits x 1 in the last 6 months    Outreach attempt # 1  Patient is able to verify HIPAA, DOB and address [facility] Care Management RN reviewed and addressed red alert with patient   EMMI:   Mr Freels informed [facility] RN CM that Mrs Hole was sleeping [facility] RN CM explained the purpose of the call and went to offer [name] RN CM office number for a return call but [name] continue to explain that they "get so many calls" and [name] has opted out of the EMMI calls "She told someone recently" When [facility] RN voiced understanding and attempted to answer his question again about why [facility] RN CM was calling, he stated he has audio issues and difficulty hearing [facility] RN CM and confirmed [name] did have several discharge papers from 09/03/19 and 09/10/19, therefore the EMMI documented answer is incorrect on  Saturday June 19/21 1316, Red Alert Reason: Got discharge papers? No  Discussed with [name] that the [facility] CMA will be updated of [name] not wanting to participate with [place]  He voiced understanding and appreciation    Plan: [facility] RN CM will close this EMMI program and will send a message to [name] updating her that [name] is eligible for [facility] services but prefers to opt out and not participate in [facility] services at this time Routed note to MD  [place]. Lavina Hamman, RN, BSN, [place] Coordinator Office number [phone] Mobile number [phone]  Main [facility] number [phone] Fax number [phone]

## 2019-09-17 NOTE — Progress Notes (Deleted)
Cardiology Office Note  Date: 09/17/2019   ID: Bailey Bishop, DOB 04/17/1940, MRN [id number]  PCP:  [name], MD  Cardiologist:  Kate Sable, MD Electrophysiologist:  [name], MD   Chief Complaint: Dyspnea on exertion/shortness of breath  History of Present Illness: Bailey Bishop is a 79 y.o. female with a history of chronic diastolic heart failure, persistent atrial fibrillation (status post unsuccessful DCCV in 02/12/2017 intolerant to amiodarone in the past, started on Tikosyn 11/2017 and failed therapy, intolerant to Lopressor, Toprol, Coreg, Cardizem and Cardizem CD in the past), hypertension, asthma, HTN, asthma, arthritis, GERD, CKD stage III.  Had seen [name] 02/13/2019 and he reported intermittent palpitations but no progressive symptoms.  He reviewed options for PPM placement and AV node ablation.  These options were not pursued at that time due to the patient being not overly symptomatic.  He called the office 05/16/2019 reporting frequent UTIs and asthma thought to be secondary to her verapamil.  She wished to stop verapamil and restart amiodarone.  [name] at atrial fibrillation clinic had her stop verapamil and restart amiodarone 200 mg daily on 04/30/2019.  She showed rate controlled atrial fibrillation after restarting.  Last saw [name], [place] on 07/14/2019.  Feeling well overall and great on amiodarone.  She did report some palpitations but heart rate was typically in the 60s to 80s when checked at home   Recent [facility] emergency room visit on 09/10/2019 for shortness of breath.  She had been recently admitted for shortness of breath felt to be related to decreased hemoglobin unexplained via colonoscopy and endoscopy.  She described feeling as bad as when she was admitted.  Having to do a full culture at night sleeping sitting on the side of the bed to catch her breath on several occasions throughout the night.  She had evidence of  possible pneumonia on her chest x-ray and a slightly elevated BNP along with pedal edema.  She was treated with Zithromax and an increased dose of Lasix.  Patient was concerned that she had been on amiodarone may have caused lung damage as a result.  A referral was placed for pulmonology for consideration of pulmonary function testing.  She had a creatinine of 1.65 and GFR of 29.  Hemoglobin 8.5 and hematocrit 27.3.  Troponin of 12, BNP 848  Past Medical History:  Diagnosis Date  . Anxiety   . Arthritis   . Asthma   . Atrial fibrillation ([place])    a. s/p unsuccessful DCCV in 01/2017 and intolerant to Amiodarone --> Rate-control strategy pursued since b. failed Tikosyn in 11/2017  . Cancer [facility]) 2001   right breast  . Dyspnea   . GERD (gastroesophageal reflux disease)   . Hypertension   . T10 vertebral fracture [facility])     Past Surgical History:  Procedure Laterality Date  . BIOPSY N/A 04/22/2018   Procedure: BIOPSY;  Surgeon: Aviva Signs, MD;  [facility];  Service: Gastroenterology;  Laterality: N/A;  . BIOPSY  09/06/2019   Procedure: BIOPSY;  Surgeon: Daneil Dolin, MD;  [facility];  Service: Endoscopy;;  gastric  . CARDIOVERSION N/A 02/18/2017   Procedure: CARDIOVERSION;  Surgeon: Josue Hector, MD;  Location: [facility] ENDOSCOPY;  Service: Cardiovascular;  Laterality: N/A;  . CARDIOVERSION N/A 12/04/2017   Procedure: CARDIOVERSION;  Surgeon: [name], MD;  Location: [place];  Service: Cardiovascular;  Laterality: N/A;  . CATARACT EXTRACTION W/PHACO  07/23/2011  Procedure: CATARACT EXTRACTION PHACO AND INTRAOCULAR LENS PLACEMENT (IOC);  Surgeon: Williams Che, MD;  Location: [facility];  Service: Ophthalmology;  Laterality: Right;  CDE:9.96  . CATARACT EXTRACTION W/PHACO Left 11/13/2018   Procedure: CATARACT EXTRACTION PHACO AND INTRAOCULAR LENS PLACEMENT (IOC);  Surgeon: Baruch Goldmann, MD;  Location: [facility];  Service: Ophthalmology;  Laterality: Left;   left, CDE: 7.44  . CHOLECYSTECTOMY N/A 02/14/2015   Procedure: LAPAROSCOPIC CHOLECYSTECTOMY;  Surgeon: Aviva Signs, MD;  Location: [facility];  Service: General;  Laterality: N/A;  . COLONOSCOPY N/A 04/22/2018   Procedure: COLONOSCOPY;  Surgeon: Aviva Signs, MD;  [facility];  Service: Gastroenterology;  Laterality: N/A;  . COLONOSCOPY WITH PROPOFOL N/A 09/06/2019   Procedure: COLONOSCOPY WITH PROPOFOL;  Surgeon: Daneil Dolin, MD;  [facility];  Service: Endoscopy;  Laterality: N/A;  . ESOPHAGEAL DILATION  09/06/2019   Procedure: ESOPHAGEAL DILATION;  Surgeon: Daneil Dolin, MD;  [facility];  Service: Endoscopy;;  . ESOPHAGOGASTRODUODENOSCOPY N/A 04/22/2018   Procedure: ESOPHAGOGASTRODUODENOSCOPY (EGD);  Surgeon: Aviva Signs, MD;  [facility];  Service: Gastroenterology;  Laterality: N/A;  . ESOPHAGOGASTRODUODENOSCOPY (EGD) WITH PROPOFOL N/A 09/06/2019   Procedure: ESOPHAGOGASTRODUODENOSCOPY (EGD) WITH PROPOFOL;  Surgeon: Daneil Dolin, MD;  [facility];  Service: Endoscopy;  Laterality: N/A;  . MASTECTOMY     bilateral mastectomy-right breast cancer-left taken by choice  . POLYPECTOMY  04/22/2018   Procedure: POLYPECTOMY;  Surgeon: Aviva Signs, MD;  [facility];  Service: Gastroenterology;;  . POLYPECTOMY  09/06/2019   Procedure: POLYPECTOMY;  Surgeon: Daneil Dolin, MD;  [facility];  Service: Endoscopy;;  cecal, ascending, hepatic flexure, sigmoid  . SEPTOPLASTY  1995    Current Outpatient Medications  Medication Sig Dispense Refill  . acetaminophen (TYLENOL) 650 MG CR tablet Take 650 mg by mouth every 8 (eight) hours as needed for pain.    Marland Kitchen albuterol (VENTOLIN HFA) 108 (90 Base) MCG/ACT inhaler Inhale 1-2 puffs into the lungs every 6 (six) hours as needed for wheezing or shortness of breath.    . ALPRAZolam (XANAX) 0.5 MG tablet Take 0.5 mg by mouth at bedtime as needed for anxiety.     Marland Kitchen  apixaban (ELIQUIS) 5 MG TABS tablet Take 1 tablet (5 mg total) by mouth 2 (two) times daily. Resume on 6/20 60 tablet 6  . azithromycin (ZITHROMAX) 250 MG tablet Take 1 tablet (250 mg total) by mouth daily. 4 tablet 0  . calcium citrate-vitamin D (CITRACAL+D) 315-200 MG-UNIT tablet Take 1 tablet by mouth 2 (two) times daily.    . fluticasone (FLONASE) 50 MCG/ACT nasal spray Place 1 spray into both nostrils daily as needed for allergies.     . furosemide (LASIX) 40 MG tablet Take 1 tablet (40 mg total) by mouth daily. 90 tablet 1  . omeprazole (PRILOSEC) 40 MG capsule Take 1 capsule (40 mg total) by mouth daily. (Patient taking differently: Take 40 mg by mouth daily as needed (for acid reflux). ) 30 capsule 0  . potassium chloride SA (KLOR-CON) 20 MEQ tablet Take 1 tablet (20 mEq total) by mouth daily. 90 tablet 3  . traZODone (DESYREL) 50 MG tablet Take 50 mg by mouth at bedtime as needed for sleep.     . verapamil (CALAN) 40 MG tablet Take 1 tablet (40 mg total) by mouth 3 (three) times daily. (Patient taking differently: Take 40 mg by mouth daily. )     No  current facility-administered medications for this visit.   Allergies:  Cardizem cd [diltiazem hcl er beads], Amiodarone, Metoprolol tartrate, Sulfa antibiotics, Tomato, Carvedilol, and Chocolate   Social History: The patient  reports that she quit smoking about 36 years ago. Her smoking use included cigarettes. She has a 28.00 pack-year smoking history. She has never used smokeless tobacco. She reports that she does not drink alcohol and does not use drugs.   Family History: The patient's family history includes Aneurysm in her father; Cancer in her mother, sister, and sister.   ROS:  Please see the history of present illness. Otherwise, complete review of systems is positive for {NONE DEFAULTED:18576::"none"}.  All other systems are reviewed and negative.   Physical Exam: VS:  There were no vitals taken for this visit., BMI There is no  height or weight on file to calculate BMI.  Wt Readings from Last 3 Encounters:  09/10/19 145 lb (65.8 kg)  09/03/19 143 lb 8.3 oz (65.1 kg)  07/14/19 137 lb (62.1 kg)    General: Patient appears comfortable at rest. HEENT: Conjunctiva and lids normal, oropharynx clear with moist mucosa. Neck: Supple, no elevated JVP or carotid bruits, no thyromegaly. Lungs: Clear to auscultation, nonlabored breathing at rest. Cardiac: Regular rate and rhythm, no S3 or significant systolic murmur, no pericardial rub. Abdomen: Soft, nontender, no hepatomegaly, bowel sounds present, no guarding or rebound. Extremities: No pitting edema, distal pulses 2+. Skin: Warm and dry. Musculoskeletal: No kyphosis. Neuropsychiatric: Alert and oriented x3, affect grossly appropriate.  ECG:  {EKG/Telemetry Strips [phone]}  Recent Labwork: 09/03/2019: ALT 10; AST 22; Magnesium 2.2 09/10/2019: B Natriuretic Peptide 848.0; BUN 15; Creatinine, Ser 1.65; Hemoglobin 8.5; Platelets 334; Potassium 3.6; Sodium 132     Component Value Date/Time   CHOL 109 09/24/2016 0438   TRIG 66 09/24/2016 0438   HDL 34 (L) 09/24/2016 0438   CHOLHDL 3.2 09/24/2016 0438   VLDL 13 09/24/2016 0438   LDLCALC 62 09/24/2016 0438    Other Studies Reviewed Today:  Echocardiogram: 09/2016 Study Conclusions   - Left ventricle: The cavity size was normal. Wall thickness was  normal. Systolic function was normal. The estimated ejection  fraction was in the range of 55% to 60%. Wall motion was normal;  there were no regional wall motion abnormalities.  - Aortic valve: Mildly to moderately calcified annulus. Mildly  thickened leaflets. Valve area (VTI): 2.92 cm^2. Valve area  (Vmax): 2.73 cm^2. Valve area (Vmean): 2.61 cm^2.  - Mitral valve: There was moderate regurgitation.  - Left atrium: The atrium was severely dilated.  - Right atrium: The atrium was severely dilated.  - Atrial septum: No defect or patent foramen  ovale was identified.  - Pulmonary arteries: Systolic pressure was moderately increased.  PA peak pressure: 49 mm Hg (S).  - Pericardium, extracardiac: There is a small circumferential  pericardial effusion.  - Technically adequate study.   Event Monitor: 11/2017 1. Atrial fib with a controlled VR and a RVR 2. NSVT 3. No prolonged pauses or bradycardia  Gregg Taylor,M.D.  Assessment and Plan:  1. Permanent atrial fibrillation ([place])   2. Chronic diastolic CHF (congestive heart failure) ([place])   3. Moderate mitral regurgitation   4. Stage 3 chronic kidney disease, unspecified whether stage 3a or 3b CKD      Medication Adjustments/Labs and Tests Ordered: Current medicines are reviewed at length with the patient today.  Concerns regarding medicines are outlined above.   Disposition: Follow-up with ***  Signed, Levell July,  NP 09/17/2019 11:23 PM    [facility] at [facility], [place], [place] [address] Phone: [phone]; Fax: [phone]

## 2019-09-18 ENCOUNTER — Telehealth: Payer: Self-pay | Admitting: Family Medicine

## 2019-09-18 ENCOUNTER — Inpatient Hospital Stay (HOSPITAL_COMMUNITY)
Admission: EM | Admit: 2019-09-18 | Discharge: 2019-09-24 | DRG: 291 | Disposition: A | Discharge status: Home / Self Care | Payer: Medicare Other | Attending: Family Medicine | Admitting: Family Medicine

## 2019-09-18 ENCOUNTER — Emergency Department (HOSPITAL_COMMUNITY): Payer: Medicare Other

## 2019-09-18 ENCOUNTER — Inpatient Hospital Stay (HOSPITAL_COMMUNITY): Payer: Medicare Other

## 2019-09-18 ENCOUNTER — Encounter (HOSPITAL_COMMUNITY): Payer: Self-pay | Admitting: *Deleted

## 2019-09-18 ENCOUNTER — Ambulatory Visit: Payer: Medicare Other | Admitting: Family Medicine

## 2019-09-18 ENCOUNTER — Other Ambulatory Visit: Payer: Self-pay

## 2019-09-18 DIAGNOSIS — I272 Pulmonary hypertension, unspecified: Secondary | ICD-10-CM | POA: Diagnosis present

## 2019-09-18 DIAGNOSIS — I5033 Acute on chronic diastolic (congestive) heart failure: Secondary | ICD-10-CM | POA: Diagnosis not present

## 2019-09-18 DIAGNOSIS — I110 Hypertensive heart disease with heart failure: Secondary | ICD-10-CM | POA: Diagnosis not present

## 2019-09-18 DIAGNOSIS — I4821 Permanent atrial fibrillation: Secondary | ICD-10-CM | POA: Diagnosis present

## 2019-09-18 DIAGNOSIS — R197 Diarrhea, unspecified: Secondary | ICD-10-CM | POA: Diagnosis present

## 2019-09-18 DIAGNOSIS — Z7901 Long term (current) use of anticoagulants: Secondary | ICD-10-CM

## 2019-09-18 DIAGNOSIS — I130 Hypertensive heart and chronic kidney disease with heart failure and stage 1 through stage 4 chronic kidney disease, or unspecified chronic kidney disease: Principal | ICD-10-CM | POA: Diagnosis present

## 2019-09-18 DIAGNOSIS — I361 Nonrheumatic tricuspid (valve) insufficiency: Secondary | ICD-10-CM | POA: Diagnosis not present

## 2019-09-18 DIAGNOSIS — E871 Hypo-osmolality and hyponatremia: Secondary | ICD-10-CM | POA: Diagnosis not present

## 2019-09-18 DIAGNOSIS — Z91018 Allergy to other foods: Secondary | ICD-10-CM

## 2019-09-18 DIAGNOSIS — R0902 Hypoxemia: Secondary | ICD-10-CM | POA: Diagnosis present

## 2019-09-18 DIAGNOSIS — R42 Dizziness and giddiness: Secondary | ICD-10-CM | POA: Diagnosis present

## 2019-09-18 DIAGNOSIS — I472 Ventricular tachycardia: Secondary | ICD-10-CM | POA: Diagnosis present

## 2019-09-18 DIAGNOSIS — J90 Pleural effusion, not elsewhere classified: Secondary | ICD-10-CM | POA: Diagnosis not present

## 2019-09-18 DIAGNOSIS — E876 Hypokalemia: Secondary | ICD-10-CM | POA: Diagnosis not present

## 2019-09-18 DIAGNOSIS — N1832 Chronic kidney disease, stage 3b: Secondary | ICD-10-CM | POA: Diagnosis not present

## 2019-09-18 DIAGNOSIS — D631 Anemia in chronic kidney disease: Secondary | ICD-10-CM | POA: Diagnosis not present

## 2019-09-18 DIAGNOSIS — I313 Pericardial effusion (noninflammatory): Secondary | ICD-10-CM | POA: Diagnosis not present

## 2019-09-18 DIAGNOSIS — Z87891 Personal history of nicotine dependence: Secondary | ICD-10-CM

## 2019-09-18 DIAGNOSIS — J811 Chronic pulmonary edema: Secondary | ICD-10-CM | POA: Diagnosis not present

## 2019-09-18 DIAGNOSIS — I482 Chronic atrial fibrillation, unspecified: Secondary | ICD-10-CM | POA: Diagnosis not present

## 2019-09-18 DIAGNOSIS — I509 Heart failure, unspecified: Secondary | ICD-10-CM | POA: Diagnosis not present

## 2019-09-18 DIAGNOSIS — F419 Anxiety disorder, unspecified: Secondary | ICD-10-CM | POA: Diagnosis not present

## 2019-09-18 DIAGNOSIS — I340 Nonrheumatic mitral (valve) insufficiency: Secondary | ICD-10-CM | POA: Diagnosis not present

## 2019-09-18 DIAGNOSIS — Z882 Allergy status to sulfonamides status: Secondary | ICD-10-CM | POA: Diagnosis not present

## 2019-09-18 DIAGNOSIS — J45909 Unspecified asthma, uncomplicated: Secondary | ICD-10-CM | POA: Diagnosis present

## 2019-09-18 DIAGNOSIS — Z9842 Cataract extraction status, left eye: Secondary | ICD-10-CM

## 2019-09-18 DIAGNOSIS — I5043 Acute on chronic combined systolic (congestive) and diastolic (congestive) heart failure: Secondary | ICD-10-CM | POA: Diagnosis not present

## 2019-09-18 DIAGNOSIS — N179 Acute kidney failure, unspecified: Secondary | ICD-10-CM | POA: Diagnosis not present

## 2019-09-18 DIAGNOSIS — K219 Gastro-esophageal reflux disease without esophagitis: Secondary | ICD-10-CM

## 2019-09-18 DIAGNOSIS — Z853 Personal history of malignant neoplasm of breast: Secondary | ICD-10-CM | POA: Diagnosis not present

## 2019-09-18 DIAGNOSIS — Z888 Allergy status to other drugs, medicaments and biological substances status: Secondary | ICD-10-CM | POA: Diagnosis not present

## 2019-09-18 DIAGNOSIS — Z9013 Acquired absence of bilateral breasts and nipples: Secondary | ICD-10-CM | POA: Diagnosis not present

## 2019-09-18 DIAGNOSIS — R0600 Dyspnea, unspecified: Secondary | ICD-10-CM | POA: Diagnosis not present

## 2019-09-18 DIAGNOSIS — Z961 Presence of intraocular lens: Secondary | ICD-10-CM | POA: Diagnosis not present

## 2019-09-18 DIAGNOSIS — D638 Anemia in other chronic diseases classified elsewhere: Secondary | ICD-10-CM | POA: Diagnosis not present

## 2019-09-18 DIAGNOSIS — Z9841 Cataract extraction status, right eye: Secondary | ICD-10-CM

## 2019-09-18 DIAGNOSIS — I517 Cardiomegaly: Secondary | ICD-10-CM | POA: Diagnosis not present

## 2019-09-18 DIAGNOSIS — R0602 Shortness of breath: Secondary | ICD-10-CM

## 2019-09-18 DIAGNOSIS — I493 Ventricular premature depolarization: Secondary | ICD-10-CM | POA: Diagnosis present

## 2019-09-18 DIAGNOSIS — Z79899 Other long term (current) drug therapy: Secondary | ICD-10-CM

## 2019-09-18 DIAGNOSIS — T502X5A Adverse effect of carbonic-anhydrase inhibitors, benzothiadiazides and other diuretics, initial encounter: Secondary | ICD-10-CM | POA: Diagnosis not present

## 2019-09-18 DIAGNOSIS — I5021 Acute systolic (congestive) heart failure: Secondary | ICD-10-CM | POA: Diagnosis not present

## 2019-09-18 DIAGNOSIS — Z20822 Contact with and (suspected) exposure to covid-19: Secondary | ICD-10-CM | POA: Diagnosis present

## 2019-09-18 DIAGNOSIS — Z9049 Acquired absence of other specified parts of digestive tract: Secondary | ICD-10-CM | POA: Diagnosis not present

## 2019-09-18 DIAGNOSIS — I083 Combined rheumatic disorders of mitral, aortic and tricuspid valves: Secondary | ICD-10-CM | POA: Diagnosis present

## 2019-09-18 HISTORY — DX: Essential (primary) hypertension: I10

## 2019-09-18 LAB — CBC WITH DIFFERENTIAL/PLATELET
Abs Immature Granulocytes: 0.03 10*3/uL (ref 0.00–0.07)
Basophils Absolute: 0 10*3/uL (ref 0.0–0.1)
Basophils Relative: 0 %
Eosinophils Absolute: 0.3 10*3/uL (ref 0.0–0.5)
Eosinophils Relative: 3 %
HCT: 27 % — ABNORMAL LOW (ref 36.0–46.0)
Hemoglobin: 8.3 g/dL — ABNORMAL LOW (ref 12.0–15.0)
Immature Granulocytes: 0 %
Lymphocytes Relative: 10 %
Lymphs Abs: 0.9 10*3/uL (ref 0.7–4.0)
MCH: 26.7 pg (ref 26.0–34.0)
MCHC: 30.7 g/dL (ref 30.0–36.0)
MCV: 86.8 fL (ref 80.0–100.0)
Monocytes Absolute: 0.8 10*3/uL (ref 0.1–1.0)
Monocytes Relative: 9 %
Neutro Abs: 6.6 10*3/uL (ref 1.7–7.7)
Neutrophils Relative %: 78 %
Platelets: 297 10*3/uL (ref 150–400)
RBC: 3.11 MIL/uL — ABNORMAL LOW (ref 3.87–5.11)
RDW: 15.6 % — ABNORMAL HIGH (ref 11.5–15.5)
WBC: 8.6 10*3/uL (ref 4.0–10.5)
nRBC: 0 % (ref 0.0–0.2)

## 2019-09-18 LAB — BASIC METABOLIC PANEL
Anion gap: 11 (ref 5–15)
BUN: 11 mg/dL (ref 8–23)
CO2: 28 mmol/L (ref 22–32)
Calcium: 8.4 mg/dL — ABNORMAL LOW (ref 8.9–10.3)
Chloride: 94 mmol/L — ABNORMAL LOW (ref 98–111)
Creatinine, Ser: 1.5 mg/dL — ABNORMAL HIGH (ref 0.44–1.00)
GFR calc Af Amer: 38 mL/min — ABNORMAL LOW (ref >60)
GFR calc non Af Amer: 33 mL/min — ABNORMAL LOW (ref >60)
Glucose, Bld: 97 mg/dL (ref 70–99)
Potassium: 2.9 mmol/L — ABNORMAL LOW (ref 3.5–5.1)
Sodium: 133 mmol/L — ABNORMAL LOW (ref 135–145)

## 2019-09-18 LAB — BRAIN NATRIURETIC PEPTIDE: B Natriuretic Peptide: 822 pg/mL — ABNORMAL HIGH (ref 0.0–100.0)

## 2019-09-18 LAB — ECHOCARDIOGRAM COMPLETE
Height: 64 in
Weight: 2320 oz

## 2019-09-18 LAB — TSH: TSH: 2.037 u[IU]/mL (ref 0.350–4.500)

## 2019-09-18 LAB — MAGNESIUM
Magnesium: 2 mg/dL (ref 1.7–2.4)
Magnesium: 2.2 mg/dL (ref 1.7–2.4)

## 2019-09-18 LAB — LACTIC ACID, PLASMA: Lactic Acid, Venous: 1.2 mmol/L (ref 0.5–1.9)

## 2019-09-18 LAB — SARS CORONAVIRUS 2 BY RT PCR (HOSPITAL ORDER, PERFORMED IN ~~LOC~~ HOSPITAL LAB): SARS Coronavirus 2: NEGATIVE

## 2019-09-18 MED ORDER — VERAPAMIL HCL 80 MG PO TABS
40.0000 mg | ORAL_TABLET | Freq: Every day | ORAL | Status: DC
  Administered 2019-09-19: 40 mg via ORAL
  Filled 2019-09-18 (×4): qty 1

## 2019-09-18 MED ORDER — SODIUM CHLORIDE 0.9 % IV SOLN: 250.0000 mL | INTRAVENOUS | Status: DC | PRN

## 2019-09-18 MED ORDER — FLUTICASONE PROPIONATE 50 MCG/ACT NA SUSP
1.0000 | Freq: Every day | NASAL | Status: DC | PRN
  Filled 2019-09-18: qty 16

## 2019-09-18 MED ORDER — APIXABAN 2.5 MG PO TABS
2.5000 mg | ORAL_TABLET | Freq: Two times a day (BID) | ORAL | Status: DC
  Filled 2019-09-18 (×7): qty 1

## 2019-09-18 MED ORDER — SODIUM CHLORIDE 0.9% FLUSH
3.0000 mL | Freq: Two times a day (BID) | INTRAVENOUS | Status: DC
  Administered 2019-09-18 – 2019-09-23 (×11): 3 mL via INTRAVENOUS

## 2019-09-18 MED ORDER — POTASSIUM CHLORIDE CRYS ER 20 MEQ PO TBCR
40.0000 meq | EXTENDED_RELEASE_TABLET | Freq: Once | ORAL | Status: AC
  Administered 2019-09-18: 40 meq via ORAL
  Filled 2019-09-18: qty 2

## 2019-09-18 MED ORDER — PANTOPRAZOLE SODIUM 40 MG PO TBEC
40.0000 mg | DELAYED_RELEASE_TABLET | Freq: Every day | ORAL | Status: DC
  Administered 2019-09-19 – 2019-09-24 (×6): 40 mg via ORAL
  Filled 2019-09-18 (×6): qty 1

## 2019-09-18 MED ORDER — APIXABAN 2.5 MG PO TABS
2.5000 mg | ORAL_TABLET | Freq: Two times a day (BID) | ORAL | Status: DC
  Administered 2019-09-18 – 2019-09-24 (×12): 2.5 mg via ORAL
  Filled 2019-09-18 (×18): qty 1

## 2019-09-18 MED ORDER — ONDANSETRON HCL 4 MG/2ML IJ SOLN
4.0000 mg | Freq: Four times a day (QID) | INTRAMUSCULAR | Status: DC | PRN
  Administered 2019-09-20 – 2019-09-23 (×3): 4 mg via INTRAVENOUS
  Filled 2019-09-18 (×3): qty 2

## 2019-09-18 MED ORDER — CALCIUM CARBONATE-VITAMIN D 500-200 MG-UNIT PO TABS
1.0000 | ORAL_TABLET | Freq: Two times a day (BID) | ORAL | Status: DC
  Administered 2019-09-19 – 2019-09-24 (×11): 1 via ORAL
  Filled 2019-09-18 (×17): qty 1

## 2019-09-18 MED ORDER — ALPRAZOLAM 0.25 MG PO TABS
0.2500 mg | ORAL_TABLET | Freq: Two times a day (BID) | ORAL | Status: DC | PRN
  Administered 2019-09-18 – 2019-09-23 (×5): 0.25 mg via ORAL
  Filled 2019-09-18 (×6): qty 1

## 2019-09-18 MED ORDER — SODIUM CHLORIDE 0.9% FLUSH: 3.0000 mL | INTRAVENOUS | Status: DC | PRN

## 2019-09-18 MED ORDER — POTASSIUM CHLORIDE CRYS ER 20 MEQ PO TBCR
20.0000 meq | EXTENDED_RELEASE_TABLET | Freq: Every day | ORAL | Status: DC
  Administered 2019-09-19 – 2019-09-24 (×6): 20 meq via ORAL
  Filled 2019-09-18 (×6): qty 1

## 2019-09-18 MED ORDER — ACETAMINOPHEN 325 MG PO TABS: 650.0000 mg | ORAL_TABLET | ORAL | Status: DC | PRN

## 2019-09-18 MED ORDER — FUROSEMIDE 10 MG/ML IJ SOLN
40.0000 mg | Freq: Two times a day (BID) | INTRAMUSCULAR | Status: DC
  Administered 2019-09-18 – 2019-09-21 (×6): 40 mg via INTRAVENOUS
  Filled 2019-09-18 (×6): qty 4

## 2019-09-18 NOTE — ED Provider Notes (Signed)
  Face-to-face evaluation   History: She presents for evaluation of worsening shortness of breath.  She was due to follow-up with cardiology today regarding this but came here instead because of how bad she was feeling.  Previously evaluated in the ED, 1 week ago.  Following that evaluation she was discharged with a prescription for Zithromax.  Physical exam: Alert, calm, cooperative.  No acute respiratory distress.  Lungs have good air movement.  No increased work of breathing.  Extremities without edema.  Medical screening examination/treatment/procedure(s) were conducted as a shared visit with non-physician practitioner(s) and myself.  I personally evaluated the patient during the encounter    Daleen Bo, MD 09/18/19 [phone]

## 2019-09-18 NOTE — ED Triage Notes (Signed)
Patient comes to the ED with shortness breath for 2 weeks stating its was worse upon waking this morning.

## 2019-09-18 NOTE — Progress Notes (Signed)
   Progress Note  Patient Name: Bailey Bishop Date of Encounter: 09/18/2019  Primary Cardiologist: Kate Sable, MD  Brief follow-up to consultation note from earlier today.  See echocardiogram report.  LVEF now approximately 35 to 40% with global hypokinesis, mildly reduced RV contraction, moderate mitral and tricuspid regurgitation, and a relatively large posterior pericardial effusion with smaller collection anteriorly and no clear evidence of tamponade.  There is evidence of organization suggesting chronicity and in fact she had a smaller effusion previously by prior studies.  Agree with IV Lasix for diuresis at this time.  Although she has had several medication intolerances including metoprolol, Coreg, and diltiazem, it would be reasonable to consider trying to switch her from verapamil to bisoprolol starting at 5 mg daily in light of her cardiomyopathy.  Hopefully she will tolerate this and it can be titrated for heart rate control as well.  Signed, Rozann Lesches, MD  09/18/2019, 4:20 PM

## 2019-09-18 NOTE — ED Notes (Signed)
Pt stated, "I am getting way more SOB. I would like some Lasix." Have notified Dr. Dyann Kief of this.

## 2019-09-18 NOTE — Progress Notes (Signed)
Called by nurse for shortness of breath of patient. Patient complains she can not lie back and is sitting on side of bed. Nurse placed on 3 liters oxygen -- saturation 99 f 24 Lung sounds appear clear.

## 2019-09-18 NOTE — Telephone Encounter (Signed)
Made an attempt to reach caller to advise that ER staff will do evaluation and contact cardiology if needed but no answer and vm not set up.

## 2019-09-18 NOTE — Telephone Encounter (Signed)
New message    Patient on way to [place] - she is gasping for breath,  They are in transit to the ER can you have someone from our office to meet her in the ER  ??

## 2019-09-18 NOTE — H&P (Signed)
History and Physical    Bailey Bishop HEN:[id number] DOB: 06-Mar-1941 DOA: 09/18/2019  PCP: [name], MD   Patient coming from: home.  I have personally briefly reviewed patient's old medical records in [place]  Chief Complaint: leg swelling, SOB and orthopnea.  HPI: Bailey Bishop is a 79 y.o. female with medical history significant of A. Fib (on eliquis), diastolic HF, GERD, anxiety, HTN, CKD stage 3b, hx of right breast cancer (status post bilateral mastectomy), asthma (mild and intermittent), recent admission around 09/03/19--09/06/19 due to SOB and findings of anemia at that time (no transfusion needed and no active source of bleeding appreciated); patient was subsequently seen on 09/10/19 due to similar complaints at the ED and treated for bronchitis and discharge home. Patient presented to Ed today with complaints of worsening SOB, LE edema and orthopnea. Patient reported that over the last 3 weeks her symptoms has continue worsening and now she is SOB even at rest and noticing some orthopnea (needing 1 pillow at time to sleep); patient also expressed some swelling on her legs and on arrival to ED O2 sat was 87% on RA (improved to mid 90's with 2L). Patient reported increased fatigue and deconditioned; she denies fever, chills, CP, nausea, vomiting, dysuria, hematuria, focal weakness, melena, hematochezia or any other complaints.  Patient reported to be compliant with her meds and also with low sodium diet, denies palpitations.  ED Course: BNP elevated, CXR with vascular congestion and finfigs of early pulmonary edema, Hgb stable and renal function WNL for her. Positive hypokalemia. Cardiology service consulted and recommended admission for CHF exacerbation. Potassium repletion started in ED and 2-D echo ordered.   Review of Systems: As per HPI otherwise all other systems reviewed and are negative.   Past Medical History:  Diagnosis Date  . Anxiety   . Arthritis   .  Asthma   . Atrial fibrillation ([place])    a. s/p unsuccessful DCCV in 01/2017 and intolerant to Amiodarone --> Rate-control strategy pursued since b. failed Tikosyn in 11/2017  . Breast cancer ([place]) 2001   Right  . Essential hypertension   . GERD (gastroesophageal reflux disease)   . T10 vertebral fracture [name])     Past Surgical History:  Procedure Laterality Date  . BIOPSY N/A 04/22/2018   Procedure: BIOPSY;  Surgeon: Aviva Signs, MD;  [facility];  Service: Gastroenterology;  Laterality: N/A;  . BIOPSY  09/06/2019   Procedure: BIOPSY;  Surgeon: Daneil Dolin, MD;  [facility];  Service: Endoscopy;;  gastric  . CARDIOVERSION N/A 02/18/2017   Procedure: CARDIOVERSION;  Surgeon: Josue Hector, MD;  Location: [facility] ENDOSCOPY;  Service: Cardiovascular;  Laterality: N/A;  . CARDIOVERSION N/A 12/04/2017   Procedure: CARDIOVERSION;  Surgeon: [name], MD;  Location: [place];  Service: Cardiovascular;  Laterality: N/A;  . CATARACT EXTRACTION W/PHACO  07/23/2011   Procedure: CATARACT EXTRACTION PHACO AND INTRAOCULAR LENS PLACEMENT ([place]);  Surgeon: Williams Che, MD;  Location: [facility] OR[facility];  Service: Ophthalmology;  Laterality: Right;  CDE:9.96  . CATARACT EXTRACTION W/PHACO Left 11/13/2018   Procedure: CATARACT EXTRACTION PHACO AND INTRAOCULAR LENS PLACEMENT ([facility]);  Surgeon: Baruch Goldmann, MD;  Location: [facility];  Service: Ophthalmology;  Laterality: Left;  left, CDE: 7.44  . CHOLECYSTECTOMY N/A 02/14/2015   Procedure: LAPAROSCOPIC CHOLECYSTECTOMY;  Surgeon: Aviva Signs, MD;  Location: [facility];  Service: General;  Laterality: N/A;  . COLONOSCOPY N/A 04/22/2018   Procedure: COLONOSCOPY;  Surgeon: Aviva Signs, MD;  [facility];  Service: Gastroenterology;  Laterality: N/A;  . COLONOSCOPY WITH PROPOFOL N/A 09/06/2019   Procedure: COLONOSCOPY WITH PROPOFOL;  Surgeon: Daneil Dolin, MD;  [facility];  Service: Endoscopy;  Laterality: N/A;  .  ESOPHAGEAL DILATION  09/06/2019   Procedure: ESOPHAGEAL DILATION;  Surgeon: Daneil Dolin, MD;  [facility];  Service: Endoscopy;;  . ESOPHAGOGASTRODUODENOSCOPY N/A 04/22/2018   Procedure: ESOPHAGOGASTRODUODENOSCOPY (EGD);  Surgeon: Aviva Signs, MD;  [facility];  Service: Gastroenterology;  Laterality: N/A;  . ESOPHAGOGASTRODUODENOSCOPY (EGD) WITH PROPOFOL N/A 09/06/2019   Procedure: ESOPHAGOGASTRODUODENOSCOPY (EGD) WITH PROPOFOL;  Surgeon: Daneil Dolin, MD;  [facility];  Service: Endoscopy;  Laterality: N/A;  . MASTECTOMY     bilateral mastectomy-right breast cancer-left taken by choice  . POLYPECTOMY  04/22/2018   Procedure: POLYPECTOMY;  Surgeon: Aviva Signs, MD;  [facility];  Service: Gastroenterology;;  . POLYPECTOMY  09/06/2019   Procedure: POLYPECTOMY;  Surgeon: Daneil Dolin, MD;  [facility];  Service: Endoscopy;;  cecal, ascending, hepatic flexure, sigmoid  . SEPTOPLASTY  1995    Social History  reports that she quit smoking about 36 years ago. Her smoking use included cigarettes. She has a 28.00 pack-year smoking history. She has never used smokeless tobacco. She reports that she does not drink alcohol and does not use drugs.  Allergies  Allergen Reactions  . Cardizem Cd [Diltiazem Hcl Er Beads] Diarrhea  . Amiodarone Nausea And Vomiting  . Metoprolol Tartrate Diarrhea  . Sulfa Antibiotics Nausea And Vomiting  . Tomato Other (See Comments)    Burns stomach  . Carvedilol Diarrhea and Rash  . Chocolate Rash and Other (See Comments)    Dark chocolate    Family History  Problem Relation Age of Onset  . Cancer Mother   . Aneurysm Father   . Cancer Sister   . Cancer Sister   . Anesthesia problems Neg Hx   . Hypotension Neg Hx   . Malignant hyperthermia Neg Hx   . Pseudochol deficiency Neg Hx   . Colon cancer Neg Hx     Prior to Admission medications   Medication Sig Start Date End Date Taking?  Authorizing Provider  acetaminophen (TYLENOL) 650 MG CR tablet Take 650 mg by mouth every 8 (eight) hours as needed for pain.   Yes [provider]  albuterol (VENTOLIN HFA) 108 (90 Base) MCG/ACT inhaler Inhale 1-2 puffs into the lungs every 6 (six) hours as needed for wheezing or shortness of breath.   Yes [provider]  ALPRAZolam Duanne Moron) 0.5 MG tablet Take 0.5 mg by mouth at bedtime as needed for anxiety.  06/18/14  Yes [provider]  apixaban (ELIQUIS) 5 MG TABS tablet Take 1 tablet (5 mg total) by mouth 2 (two) times daily. Resume on 6/20 09/06/19  Yes Kathie Dike, MD  calcium citrate-vitamin D (CITRACAL+D) 315-200 MG-UNIT tablet Take 1 tablet by mouth 2 (two) times daily.   Yes [provider]  fluticasone (FLONASE) 50 MCG/ACT nasal spray Place 1 spray into both nostrils daily as needed for allergies.  12/26/18  Yes [provider]  furosemide (LASIX) 40 MG tablet Take 1 tablet (40 mg total) by mouth daily. 08/17/19  Yes Sherran Needs, NP  omeprazole (PRILOSEC) 40 MG capsule Take 1 capsule (40 mg total) by mouth daily. Patient taking differently: Take 40 mg by mouth daily as needed (for acid reflux).  09/06/19  Yes  Kathie Dike, MD  potassium chloride SA (KLOR-CON) 20 MEQ tablet Take 1 tablet (20 mEq total) by mouth daily. 04/30/19  Yes Sherran Needs, NP  traZODone (DESYREL) 50 MG tablet Take 50 mg by mouth at bedtime as needed for sleep.  08/19/19  Yes [provider]  verapamil (CALAN) 40 MG tablet Take 1 tablet (40 mg total) by mouth 3 (three) times daily. Patient taking differently: Take 40 mg by mouth daily.  09/02/19  Yes Sherran Needs, NP  azithromycin (ZITHROMAX) 250 MG tablet Take 1 tablet (250 mg total) by mouth daily. 09/10/19   Veryl Speak, MD    Physical Exam: Vitals:   09/18/19 1604 09/18/19 1630 09/18/19 1700 09/18/19 1730  BP:  (!) 148/79 123/72 (!) 138/108  Pulse: 98 98 99 96  Resp: (!) 22 18 (!) 25 (!) 29    Temp:      TempSrc:      SpO2: 93% 95% 97% 97%  Weight:      Height:        Constitutional: in mild distress due to SOB and feeling uncomfortable; no CP, no palpitations reported, no fever. Vitals:   09/18/19 1604 09/18/19 1630 09/18/19 1700 09/18/19 1730  BP:  (!) 148/79 123/72 (!) 138/108  Pulse: 98 98 99 96  Resp: (!) 22 18 (!) 25 (!) 29  Temp:      TempSrc:      SpO2: 93% 95% 97% 97%  Weight:      Height:       Eyes: PERRL, lids and conjunctivae normal, no icterus, no nystagmus. ENMT: Mucous membranes are moist. Posterior pharynx clear of any exudate or lesions. Neck: normal, supple, no masses, no thyromegaly Respiratory: positive tachypnea with minimal exertion, 2L [place] in place, positive fine crackles, no wheezing. Cardiovascular: irregular, positive murnur, no rubs, no gallops, trace to 1 + edema bilaterally.  Abdomen: no tenderness, no masses palpated. No hepatosplenomegaly. Bowel sounds positive.  Musculoskeletal: no clubbing / cyanosis.  Skin: no rashes, no petechiae Neurologic: CN 2-12 grossly intact. No focal deficits. MS 4/5 bilaterally due to poor effort. Psychiatric:  Alert and oriented x 3. Normal mood.    Labs on Admission: I have personally reviewed following labs and imaging studies  CBC: Recent Labs  Lab 09/18/19 1117  WBC 8.6  NEUTROABS 6.6  HGB 8.3*  HCT 27.0*  MCV 86.8  PLT 818    Basic Metabolic Panel: Recent Labs  Lab 09/18/19 1116 09/18/19 1624  NA 133*  --   K 2.9*  --   CL 94*  --   CO2 28  --   GLUCOSE 97  --   BUN 11  --   CREATININE 1.50*  --   CALCIUM 8.4*  --   MG 2.0 2.2    GFR: Estimated Creatinine Clearance: 27.9 mL/min (A) (by C-G formula based on SCr of 1.5 mg/dL (H)).  Urine analysis:    Component Value Date/Time   COLORURINE STRAW (A) 05/23/2019 0914   APPEARANCEUR CLEAR 05/23/2019 0914   LABSPEC 1.004 (L) 05/23/2019 0914   PHURINE 7.0 05/23/2019 0914   GLUCOSEU NEGATIVE 05/23/2019 0914   HGBUR NEGATIVE  05/23/2019 0914   BILIRUBINUR NEGATIVE 05/23/2019 0914   KETONESUR NEGATIVE 05/23/2019 0914   PROTEINUR NEGATIVE 05/23/2019 0914   NITRITE NEGATIVE 05/23/2019 0914   LEUKOCYTESUR NEGATIVE 05/23/2019 0914    Radiological Exams on Admission: DG Chest Port 1 View  Result Date: 09/18/2019 CLINICAL DATA:  Dyspnea for 2 weeks EXAM:  PORTABLE CHEST 1 VIEW COMPARISON:  09/10/2019 chest radiograph. FINDINGS: Stable cardiomediastinal silhouette with moderate cardiomegaly. No pneumothorax. Suggestion of small stable bilateral pleural effusions. New borderline mild pulmonary edema. Stable mild bibasilar scarring versus atelectasis. IMPRESSION: 1. New borderline mild congestive heart failure. 2. Stable small bilateral pleural effusions with stable mild bibasilar scarring versus atelectasis. Electronically Signed   By: Ilona Sorrel M.D.   On: 09/18/2019 11:37   ECHOCARDIOGRAM COMPLETE  Result Date: 09/18/2019    ECHOCARDIOGRAM REPORT   Patient Name:   Bailey Bishop Date of Exam: 09/18/2019 Medical Rec #:  [id number]          Height:       64.0 in Accession #:    [id number]         Weight:       145.0 lb Date of Birth:  Jan 26, 1941          BSA:          1.706 m Patient Age:    53 years           BP:           105/64 mmHg Patient Gender: F                  HR:           88 bpm. Exam Location:  [facility] Procedure: 2D Echo, Cardiac Doppler and Color Doppler Indications:    Dyspnea  History:        Patient has prior history of Echocardiogram examinations, most                 recent 09/24/2016. CHF, Arrythmias:Atrial Fibrillation,                 Signs/Symptoms:Shortness of Breath; Risk Factors:Hypertension.                 CKD, LE edema.  Sonographer:    Dustin Flock RDCS Referring Phys: [place]  1. Left ventricular ejection fraction, by estimation, is 35 to 40%. The left ventricle has moderately decreased function. The left ventricle demonstrates global hypokinesis. Left ventricular  diastolic parameters are indeterminate.  2. Right ventricular systolic function is mildly reduced. The right ventricular size is mildly enlarged. There is normal pulmonary artery systolic pressure. The estimated right ventricular systolic pressure is 55.7 mmHg.  3. Left atrial size was moderately dilated.  4. Right atrial size was severely dilated.  5. Large pericardial effusion. The pericardial effusion is posterior to the left ventricle. No obvious RV compromise to suggest tamponade and there are signs of chronicity. Small anterior effusion.  6. The mitral valve is grossly normal. Moderate mitral valve regurgitation.  7. Tricuspid valve regurgitation is moderate.  8. The aortic valve is tricuspid. Aortic valve regurgitation is not visualized. Mild to moderate aortic valve sclerosis/calcification is present, without any evidence of aortic stenosis. Moderate nodular calcification of the noncoronary cusp.  9. The inferior vena cava is dilated in size with <50% respiratory variability, suggesting right atrial pressure of 15 mmHg. FINDINGS  Left Ventricle: Left ventricular ejection fraction, by estimation, is 35 to 40%. The left ventricle has moderately decreased function. The left ventricle demonstrates global hypokinesis. The left ventricular internal cavity size was normal in size. There is no left ventricular hypertrophy. Left ventricular diastolic parameters are indeterminate. Right Ventricle: The right ventricular size is mildly enlarged. No increase in right ventricular wall thickness. Right ventricular systolic function is mildly reduced. There  is normal pulmonary artery systolic pressure. The tricuspid regurgitant velocity  is 2.13 m/s, and with an assumed right atrial pressure of 15 mmHg, the estimated right ventricular systolic pressure is 78.2 mmHg. Left Atrium: Left atrial size was moderately dilated. Right Atrium: Right atrial size was severely dilated. Pericardium: A large pericardial effusion is  present. The pericardial effusion is posterior to the left ventricle. Mitral Valve: The mitral valve is grossly normal. Mild mitral annular calcification. Moderate mitral valve regurgitation. Tricuspid Valve: The tricuspid valve is grossly normal. Tricuspid valve regurgitation is moderate. Aortic Valve: The aortic valve is tricuspid. Aortic valve regurgitation is not visualized. Mild to moderate aortic valve sclerosis/calcification is present, without any evidence of aortic stenosis. Mild aortic valve annular calcification. Pulmonic Valve: The pulmonic valve was grossly normal. Pulmonic valve regurgitation is trivial. Aorta: The aortic root is normal in size and structure. Venous: The inferior vena cava is dilated in size with less than 50% respiratory variability, suggesting right atrial pressure of 15 mmHg. IAS/Shunts: The atrial septum is grossly normal.  LEFT VENTRICLE PLAX 2D LVIDd:         4.90 cm  Diastology LVIDs:         3.91 cm  LV e' lateral:   10.30 cm/s LV PW:         0.95 cm  LV E/e' lateral: 9.2 LV IVS:        0.99 cm  LV e' medial:    8.49 cm/s LVOT diam:     2.00 cm  LV E/e' medial:  11.1 LV SV:         36 LV SV Index:   21 LVOT Area:     3.14 cm  RIGHT VENTRICLE RV Basal diam:  3.97 cm RV S prime:     8.16 cm/s TAPSE (M-mode): 2.2 cm LEFT ATRIUM             Index       RIGHT ATRIUM           Index LA diam:        3.90 cm 2.29 cm/m  RA Area:     26.40 cm LA Vol (A2C):   77.0 ml 45.12 ml/m RA Volume:   91.30 ml  53.50 ml/m LA Vol (A4C):   76.6 ml 44.89 ml/m LA Biplane Vol: 80.8 ml 47.35 ml/m  AORTIC VALVE LVOT Vmax:   74.60 cm/s LVOT Vmean:  46.700 cm/s LVOT VTI:    0.116 m  AORTA Ao Root diam: 3.20 cm MITRAL VALVE               TRICUSPID VALVE MV Area (PHT): 6.71 cm    TR Peak grad:   18.1 mmHg MV Decel Time: 113 msec    TR Vmax:        213.00 cm/s MV E velocity: 94.60 cm/s MV A velocity: 30.10 cm/s  SHUNTS MV E/A ratio:  3.14        Systemic VTI:  0.12 m                            Systemic  Diam: 2.00 cm Rozann Lesches MD Electronically signed by Rozann Lesches MD Signature Date/Time: 09/18/2019/4:12:26 PM    Final     EKG: Independently reviewed. Positive atrial fibrillation, no acute ischemic changes.  Assessment/Plan 1-acute on chronic CHF: prior hx of diastolic HF -2-echo today demonstrating systolic dysfunction, EF 42-35% and global hypokinesis -follow daily weight,  strict I's and O's, low sodium diet and IV diuretics -will closely follow electrolytes and renal function -given soft BP, acute exacerbation, hx of asthma and renal insufficiency no b-blockers and no ACE or ARB -will closely follow heart failure recommendations.  2-A. Fib -continue verapamil and eliquis for now  3-CKD stage 3B -currently stable and at baseline -will closely monitor trend with diuresis  4-hypokalemia -checking Mg level -repleting as needed -monitoring on telemetry  5-hx of asthma -no wheezing currently -continue monitoring only  6-GERD -continue PPI  7-anxiety -continue PRN xanax  8-hx of breast cancer -status post bilat mastectomy -continue surveillance as an outpatient   DVT prophylaxis: Chronically on eliquis. Code Status:   Full Code. Family Communication:  No family at bedside. Disposition Plan:   Patient is from:  Home   Anticipated DC to:  To be determined  Anticipated DC date:  6/28>> 6/29  Anticipated DC barriers: Volume stability and improvement in breathing.  Consults called:  Cardiology service  Admission status:  Telemetry bed, LOS > 2 midnights, inpatient status.  Severity of Illness: Moderate to severe illness, with high risk for further decompensation w/o hospital treatment and IV therapy. Patient is requiring oxygen supplementation for hypoxia (which is new for her) and is demonstrating early pulmonary edema and new component of systolic HF component.  Barton Dubois MD [facility]  How to contact the [facility] Attending or Consulting provider [place] or covering provider during after hours [place], for this patient?   1. Check the care team in [facility] and look for a) attending/consulting [facility] provider listed and b) the [facility] team listed 2. Log into [email or web address] and use [place]'s universal password to access. If you do not have the password, please contact the hospital operator. 3. Locate the [facility] provider you are looking for under [facility] and page to a number that you can be directly reached. 4. If you still have difficulty reaching the provider, please page the [facility] (Director on Call) for the Hospitalists listed on amion for assistance.  09/18/2019, 6:16 PM

## 2019-09-18 NOTE — ED Notes (Signed)
Pt got up and went to the bathroom with assistance by her husband; while pt was in the bathroom she states she slipped off the toilet; pt states "I feel so weak"; pt denies any injuries; no skin breakdown noted

## 2019-09-18 NOTE — ED Provider Notes (Signed)
[facility] EMERGENCY DEPARTMENT Provider Note   CSN: [id number] Arrival date & time: 09/18/19  1014     History Chief Complaint  Patient presents with  . Shortness of Breath    Bailey Bishop is a 79 y.o. female.  79yo female with complaint of SHOB x 2-3 weeks, worse for the past week, recently admitted to the hospital for same. States no longer taking Amiodarone, is taking Verapamil, Eliquis, and Furosemide. SHOB is worse with exertion, sleeps with 1 pillow. Reports lower extremity swelling, not worse than usual. Currently on 2L [place] at 96%, not on oxygen at home.         Past Medical History:  Diagnosis Date  . Anxiety   . Arthritis   . Asthma   . Atrial fibrillation ([place])    a. s/p unsuccessful DCCV in 01/2017 and intolerant to Amiodarone --> Rate-control strategy pursued since b. failed Tikosyn in 11/2017  . Breast cancer ([place]) 2001   Right  . Essential hypertension   . GERD (gastroesophageal reflux disease)   . T10 vertebral fracture [facility])     Patient Active Problem List   Diagnosis Date Noted  . GI bleed 09/05/2019  . Symptomatic anemia 09/03/2019  . CKD (chronic kidney disease), stage IIIb 09/03/2019  . Iron deficiency anemia   . Benign neoplasm of transverse colon   . Diverticulosis of large intestine without diverticulitis   . Visit for monitoring Tikosyn therapy 12/02/2017  . Atrial fibrillation ([place]) 03/31/2017  . Persistent atrial fibrillation ([place])   . Anticoagulated 10/03/2016  . COPD (chronic obstructive pulmonary disease) ([place]) 10/03/2016  . Hypokalemia 09/24/2016  . Asthma in adult without complication 85/04/7739  . Hyperglycemia 09/23/2016  . Anxiety   . GERD (gastroesophageal reflux disease)   . Shortness of breath   . Essential hypertension   . Status asthmaticus 07/07/2014    Past Surgical History:  Procedure Laterality Date  . BIOPSY N/A 04/22/2018   Procedure: BIOPSY;  Surgeon: Aviva Signs, MD;  Location[facility];   Service: Gastroenterology;  Laterality: N/A;  . BIOPSY  09/06/2019   Procedure: BIOPSY;  Surgeon: Daneil Dolin, MD;  Location: [facility];  Service: Endoscopy;;  gastric  . CARDIOVERSION N/A 02/18/2017   Procedure: CARDIOVERSION;  Surgeon: Josue Hector, MD;  Location: [facility] ENDOSCOPY;  Service: Cardiovascular;  Laterality: N/A;  . CARDIOVERSION N/A 12/04/2017   Procedure: CARDIOVERSION;  Surgeon: [name], MD;  Location: [place];  Service: Cardiovascular;  Laterality: N/A;  . CATARACT EXTRACTION W/PHACO  07/23/2011   Procedure: CATARACT EXTRACTION PHACO AND INTRAOCULAR LENS PLACEMENT ([place]);  Surgeon: Williams Che, MD;  Location: [facility] ORS;  Service: Ophthalmology;  Laterality: Right;  CDE:9.96  . CATARACT EXTRACTION W/PHACO Left 11/13/2018   Procedure: CATARACT EXTRACTION PHACO AND INTRAOCULAR LENS PLACEMENT (IOC);  Surgeon: Baruch Goldmann, MD;  Location: [facility] ORS;  Service: Ophthalmology;  Laterality: Left;  left, CDE: 7.44  . CHOLECYSTECTOMY N/A 02/14/2015   Procedure: LAPAROSCOPIC CHOLECYSTECTOMY;  Surgeon: Aviva Signs, MD;  Location: [facility] ORS;  Service: General;  Laterality: N/A;  . COLONOSCOPY N/A 04/22/2018   Procedure: COLONOSCOPY;  Surgeon: Aviva Signs, MD;  Location[facility];  Service: Gastroenterology;  Laterality: N/A;  . COLONOSCOPY WITH PROPOFOL N/A 09/06/2019   Procedure: COLONOSCOPY WITH PROPOFOL;  Surgeon: Daneil Dolin, MD;  [facility];  Service: Endoscopy;  Laterality: N/A;  . ESOPHAGEAL DILATION  09/06/2019   Procedure: ESOPHAGEAL DILATION;  Surgeon: Daneil Dolin, MD;  [facility]  [facility];  Service: Endoscopy;;  . ESOPHAGOGASTRODUODENOSCOPY N/A 04/22/2018   Procedure: ESOPHAGOGASTRODUODENOSCOPY (EGD);  Surgeon: Aviva Signs, MD;  [facility];  Service: Gastroenterology;  Laterality: N/A;  . ESOPHAGOGASTRODUODENOSCOPY (EGD) WITH PROPOFOL N/A 09/06/2019   Procedure: ESOPHAGOGASTRODUODENOSCOPY (EGD) WITH PROPOFOL;  Surgeon:  Daneil Dolin, MD;  [facility];  Service: Endoscopy;  Laterality: N/A;  . MASTECTOMY     bilateral mastectomy-right breast cancer-left taken by choice  . POLYPECTOMY  04/22/2018   Procedure: POLYPECTOMY;  Surgeon: Aviva Signs, MD;  [facility];  Service: Gastroenterology;;  . POLYPECTOMY  09/06/2019   Procedure: POLYPECTOMY;  Surgeon: Daneil Dolin, MD;  [facility];  Service: Endoscopy;;  cecal, ascending, hepatic flexure, sigmoid  . SEPTOPLASTY  1995     OB History   No obstetric history on file.     Family History  Problem Relation Age of Onset  . Cancer Mother   . Aneurysm Father   . Cancer Sister   . Cancer Sister   . Anesthesia problems Neg Hx   . Hypotension Neg Hx   . Malignant hyperthermia Neg Hx   . Pseudochol deficiency Neg Hx   . Colon cancer Neg Hx     Social History   Tobacco Use  . Smoking status: Former Smoker    Packs/day: 1.00    Years: 28.00    Pack years: 28.00    Types: Cigarettes    Quit date: 07/17/1983    Years since quitting: 36.1  . Smokeless tobacco: Never Used  Vaping Use  . Vaping Use: Never used  Substance Use Topics  . Alcohol use: No  . Drug use: No    Home Medications Prior to Admission medications   Medication Sig Start Date End Date Taking? Authorizing Provider  acetaminophen (TYLENOL) 650 MG CR tablet Take 650 mg by mouth every 8 (eight) hours as needed for pain.    [provider]  albuterol (VENTOLIN HFA) 108 (90 Base) MCG/ACT inhaler Inhale 1-2 puffs into the lungs every 6 (six) hours as needed for wheezing or shortness of breath.    [provider]  ALPRAZolam Duanne Moron) 0.5 MG tablet Take 0.5 mg by mouth at bedtime as needed for anxiety.  06/18/14   [provider]  apixaban (ELIQUIS) 5 MG TABS tablet Take 1 tablet (5 mg total) by mouth 2 (two) times daily. Resume on 6/20 09/06/19   Kathie Dike, MD  azithromycin (ZITHROMAX) 250 MG tablet Take 1 tablet (250 mg  total) by mouth daily. 09/10/19   Veryl Speak, MD  calcium citrate-vitamin D (CITRACAL+D) 315-200 MG-UNIT tablet Take 1 tablet by mouth 2 (two) times daily.    [provider]  fluticasone (FLONASE) 50 MCG/ACT nasal spray Place 1 spray into both nostrils daily as needed for allergies.  12/26/18   [provider]  furosemide (LASIX) 40 MG tablet Take 1 tablet (40 mg total) by mouth daily. 08/17/19   Sherran Needs, NP  omeprazole (PRILOSEC) 40 MG capsule Take 1 capsule (40 mg total) by mouth daily. Patient taking differently: Take 40 mg by mouth daily as needed (for acid reflux).  09/06/19   Kathie Dike, MD  potassium chloride SA (KLOR-CON) 20 MEQ tablet Take 1 tablet (20 mEq total) by mouth daily. 04/30/19   Sherran Needs, NP  traZODone (DESYREL) 50 MG tablet Take 50 mg by mouth at bedtime as needed for sleep.  08/19/19   [provider]  verapamil (CALAN) 40 MG tablet Take 1 tablet (40 mg total) by mouth 3 (three) times daily. Patient taking differently: Take 40 mg by mouth daily.  09/02/19   Sherran Needs, NP    Allergies    Cardizem cd [diltiazem hcl er beads], Amiodarone, Metoprolol tartrate, Sulfa antibiotics, Tomato, Carvedilol, and Chocolate  Review of Systems   Review of Systems  Constitutional: Negative for fever.  Respiratory: Positive for shortness of breath. Negative for cough.   Cardiovascular: Positive for leg swelling. Negative for chest pain.  Gastrointestinal: Negative for abdominal pain, constipation, diarrhea, nausea and vomiting.  Genitourinary: Negative for difficulty urinating and dysuria.  Musculoskeletal: Negative for arthralgias and myalgias.  Skin: Negative for rash and wound.  Neurological: Positive for dizziness. Negative for weakness.  Hematological: Bruises/bleeds easily.  All other systems reviewed and are negative.   Physical Exam Updated Vital Signs BP 107/64   Pulse 89   Temp 98 F (36.7 C) (Oral)   Resp (!) 23   Ht  5\' 4"  (1.626 m) Comment: Simultaneous filing. User may not have seen previous data.  Wt 65.8 kg Comment: Simultaneous filing. User may not have seen previous data.  SpO2 91%   BMI 24.89 kg/m   Physical Exam Vitals and nursing note reviewed.  Constitutional:      General: She is not in acute distress.    Appearance: She is well-developed. She is not diaphoretic.  HENT:     Head: Normocephalic and atraumatic.  Cardiovascular:     Rate and Rhythm: Normal rate. Rhythm irregular.     Heart sounds: Murmur heard.   Pulmonary:     Effort: Tachypnea present.     Breath sounds: Examination of the left-lower field reveals decreased breath sounds. Decreased breath sounds present.  Chest:     Chest wall: No tenderness.  Abdominal:     Palpations: Abdomen is soft.     Tenderness: There is no abdominal tenderness.  Musculoskeletal:     Cervical back: Neck supple.     Right lower leg: Edema present.     Left lower leg: Edema present.  Skin:    General: Skin is warm and dry.     Findings: No erythema or rash.  Neurological:     Mental Status: She is alert and oriented to person, place, and time.  Psychiatric:        Behavior: Behavior normal.     ED Results / Procedures / Treatments   Labs (all labs ordered are listed, but only abnormal results are displayed) Labs Reviewed  BASIC METABOLIC PANEL - Abnormal; Notable for the following components:      Result Value   Sodium 133 (*)    Potassium 2.9 (*)    Chloride 94 (*)    Creatinine, Ser 1.50 (*)    Calcium 8.4 (*)    GFR calc non Af Amer 33 (*)    GFR calc Af Amer 38 (*)    All other components within normal limits  BRAIN NATRIURETIC PEPTIDE - Abnormal; Notable for the following components:   B Natriuretic Peptide 822.0 (*)    All other components within normal limits  CBC WITH DIFFERENTIAL/PLATELET - Abnormal; Notable for the following components:   RBC 3.11 (*)    Hemoglobin 8.3 (*)    HCT 27.0 (*)    RDW 15.6 (*)    All  other components within normal limits  SARS CORONAVIRUS 2 BY RT PCR (HOSPITAL ORDER, [place] [facility])  LACTIC ACID, PLASMA  MAGNESIUM    EKG EKG Interpretation  Date/Time:  Friday September 18 2019 10:45:51 EDT Ventricular Rate:  87 PR Interval:    QRS Duration: 86 QT Interval:  503 QTC Calculation: 559 R Axis:   -55 Text Interpretation: Atrial fibrillation Baseline wander Left anterior fascicular block Anterior infarct, old Minimal ST depression, inferior leads Prolonged QT interval Since last tracing QT has lengthened Otherwise no significant change Confirmed by Daleen Bo [id number]) on 09/18/2019 11:44:10 AM   Radiology DG Chest Port 1 View  Result Date: 09/18/2019 CLINICAL DATA:  Dyspnea for 2 weeks EXAM: PORTABLE CHEST 1 VIEW COMPARISON:  09/10/2019 chest radiograph. FINDINGS: Stable cardiomediastinal silhouette with moderate cardiomegaly. No pneumothorax. Suggestion of small stable bilateral pleural effusions. New borderline mild pulmonary edema. Stable mild bibasilar scarring versus atelectasis. IMPRESSION: 1. New borderline mild congestive heart failure. 2. Stable small bilateral pleural effusions with stable mild bibasilar scarring versus atelectasis. Electronically Signed   By: Ilona Sorrel M.D.   On: 09/18/2019 11:37    Procedures Procedures (including critical care time)  Medications Ordered in ED Medications  potassium chloride SA (KLOR-CON) CR tablet 40 mEq (has no administration in time range)    ED Course  I have reviewed the triage vital signs and the nursing notes.  Pertinent labs & imaging results that were available during my care of the patient were reviewed by me and considered in my medical decision making (see chart for details).  Clinical Course as of Sep 17 1221  Fri Sep 17, 6328  [id number] 79 year old female with complaint of shortness of breath.  Patient was admitted to the hospital June 10-13, found to be anemic with 3 g drop in her  hemoglobin, on Eliquis, no source found on EGD and colonoscopy.  Patient returned to the ER on June 17 with shortness of breath, thought to be developing pneumonia and was treated with Zithromax.  Patient returns today reporting persistent/worsening shortness of breath, dyspnea on exertion, denies orthopnea, denies chest pain.   [LM]  1109 Patient states on arrival, her oxygen saturation was 87%, which point she was placed on 2 L nasal cannula.  Patient has been resting comfortably, satting in the upper 90s on 2 L.  Oxygen was discontinued and monitored at room air, dropped as low as 90%. Patient was tachycardic on arrival with a rate of 102, has history of chronic A. fib, rate improved to the 80s to 90s.  Patient has been mildly tachypneic throughout her stay.    [LM]  1219 On exam has mild pitting edema to bilateral lower extremities.  Chest x-ray shows new borderline mild congestive heart failure, stable small bilateral pleural effusions with stable mild bibasilar scarring. Labs show stable BNP, BMP with mild hypokalemia with potassium of 2.9, will give oral replacement and add on magnesium level.  CBC with stable hemoglobin of 8.3.  Lactic acid of 1.2 Case discussed with Dr. Eulis Foster, ER attending who has seen the patient, recommends consult to cardiology. Case discussed with Dr. Domenic Polite with cardiology who has reviewed patient's records and will see the patient, requests hospitalist admit for worsening chronic heart failure, plan for repeat echo, last echo completed in 2018. Case discussed with Dr. Dyann Kief with [facility] who will consult for admission.    [LM]    Clinical Course User Index [LM] Roque Lias   MDM Rules/Calculators/A&P  Final Clinical Impression(s) / ED Diagnoses Final diagnoses:  Acute on chronic diastolic congestive heart failure [facility])    Rx / DC Orders ED Discharge Orders    None       Roque Lias 09/18/19  1223    Daleen Bo, MD 09/18/19 [phone]

## 2019-09-18 NOTE — Progress Notes (Signed)
  Echocardiogram 2D Echocardiogram has been performed.  Bailey Bishop 09/18/2019, 2:50 PM

## 2019-09-18 NOTE — Consult Note (Signed)
Cardiology Consultation:   [name]: HOLY BATTENFIELD; [id number]; January 26, 1941   Admit date: 09/18/2019 Date of Consult: 09/18/2019  Primary Care Provider: [name], MD Primary Cardiologist: Kate Sable, MD Primary Electrophysiologist: [name], MD   Patient Profile:   Bailey Bishop is an 79 y.o. female with a history of permanent atrial fibrillation, hypertension, chronic diastolic heart failure, and CKD stage IIIb who is being seen today for the evaluation of worsening shortness of breath and leg swelling at the request of Bailey Bishop.  History of Present Illness:   Bailey Bishop is a long-term patient of [name] presenting to the ER complaining of worsening shortness of breath, orthopnea, and leg swelling over the last few weeks.  This is my first meeting with her today, I reviewed extensive records and updated the chart.  She has a history of persistent atrial fibrillation, unsuccessful cardioversion attempt in 2008, subsequently intolerant to amiodarone, failed treatment with Tikosyn, and has been intolerant to various AV nodal blockers over time including metoprolol, Coreg, and diltiazem.  Most recently she has been on verapamil.  She has also been evaluated by [name] in the past for discussion of possible AV node ablation and pacemaker, however this has not been pursued.  She was last seen by Ms. Strader PA-C in April at which point she had been started back on amiodarone at the direction of the atrial fibrillation clinic, apparently tolerating it at that time, however ultimately began to experience more shortness of breath and stopped the medication with resumption of verapamil.  She states that she has been on verapamil for at least the last 3 or 4 weeks.  She does not describe any chest pain, states that she cannot lay down to sleep, has been "gasping" recently.  Last echocardiogram was in 2018 at which point LVEF was 55 to 60% with severe  left atrial enlargement, moderate mitral regurgitation, and moderate pulmonary hypertension.  There was also a small pericardial effusion.   Past Medical History:  Diagnosis Date  . Anxiety   . Arthritis   . Asthma   . Atrial fibrillation ([place])    a. s/p unsuccessful DCCV in 01/2017 and intolerant to Amiodarone --> Rate-control strategy pursued since b. failed Tikosyn in 11/2017  . Breast cancer ([place]) 2001   Right  . Essential hypertension   . GERD (gastroesophageal reflux disease)   . T10 vertebral fracture [facility])     Past Surgical History:  Procedure Laterality Date  . BIOPSY N/A 04/22/2018   Procedure: BIOPSY;  Surgeon: Aviva Signs, MD;  [facility];  Service: Gastroenterology;  Laterality: N/A;  . BIOPSY  09/06/2019   Procedure: BIOPSY;  Surgeon: Daneil Dolin, MD;  [facility];  Service: Endoscopy;;  gastric  . CARDIOVERSION N/A 02/18/2017   Procedure: CARDIOVERSION;  Surgeon: Josue Hector, MD;  Location: [facility] ENDOSCOPY;  Service: Cardiovascular;  Laterality: N/A;  . CARDIOVERSION N/A 12/04/2017   Procedure: CARDIOVERSION;  Surgeon: [name], MD;  Location: [place];  Service: Cardiovascular;  Laterality: N/A;  . CATARACT EXTRACTION W/PHACO  07/23/2011   Procedure: CATARACT EXTRACTION PHACO AND INTRAOCULAR LENS PLACEMENT ([place]);  Surgeon: Williams Che, MD;  Location: [facility];  Service: Ophthalmology;  Laterality: Right;  CDE:9.96  . CATARACT EXTRACTION W/PHACO Left 11/13/2018   Procedure: CATARACT EXTRACTION PHACO AND INTRAOCULAR LENS PLACEMENT (I[facility]);  Surgeon: Baruch Goldmann, MD;  Location: [facility];  Service: Ophthalmology;  Laterality: Left;  left, CDE: 7.44  .  CHOLECYSTECTOMY N/A 02/14/2015   Procedure: LAPAROSCOPIC CHOLECYSTECTOMY;  Surgeon: Aviva Signs, MD;  Location: [facility];  Service: General;  Laterality: N/A;  . COLONOSCOPY N/A 04/22/2018   Procedure: COLONOSCOPY;  Surgeon: Aviva Signs, MD;  [facility];  Service:  Gastroenterology;  Laterality: N/A;  . COLONOSCOPY WITH PROPOFOL N/A 09/06/2019   Procedure: COLONOSCOPY WITH PROPOFOL;  Surgeon: Daneil Dolin, MD;  [facility];  Service: Endoscopy;  Laterality: N/A;  . ESOPHAGEAL DILATION  09/06/2019   Procedure: ESOPHAGEAL DILATION;  Surgeon: Daneil Dolin, MD;  [facility];  Service: Endoscopy;;  . ESOPHAGOGASTRODUODENOSCOPY N/A 04/22/2018   Procedure: ESOPHAGOGASTRODUODENOSCOPY (EGD);  Surgeon: Aviva Signs, MD;  [facility];  Service: Gastroenterology;  Laterality: N/A;  . ESOPHAGOGASTRODUODENOSCOPY (EGD) WITH PROPOFOL N/A 09/06/2019   Procedure: ESOPHAGOGASTRODUODENOSCOPY (EGD) WITH PROPOFOL;  Surgeon: Daneil Dolin, MD;  [facility];  Service: Endoscopy;  Laterality: N/A;  . MASTECTOMY     bilateral mastectomy-right breast cancer-left taken by choice  . POLYPECTOMY  04/22/2018   Procedure: POLYPECTOMY;  Surgeon: Aviva Signs, MD;  [facility];  Service: Gastroenterology;;  . POLYPECTOMY  09/06/2019   Procedure: POLYPECTOMY;  Surgeon: Daneil Dolin, MD;  [facility];  Service: Endoscopy;;  cecal, ascending, hepatic flexure, sigmoid  . SEPTOPLASTY  1995     Outpatient Medications: No current facility-administered medications on file prior to encounter.   Current Outpatient Medications on File Prior to Encounter  Medication Sig Dispense Refill  . acetaminophen (TYLENOL) 650 MG CR tablet Take 650 mg by mouth every 8 (eight) hours as needed for pain.    Marland Kitchen albuterol (VENTOLIN HFA) 108 (90 Base) MCG/ACT inhaler Inhale 1-2 puffs into the lungs every 6 (six) hours as needed for wheezing or shortness of breath.    . ALPRAZolam (XANAX) 0.5 MG tablet Take 0.5 mg by mouth at bedtime as needed for anxiety.     Marland Kitchen apixaban (ELIQUIS) 5 MG TABS tablet Take 1 tablet (5 mg total) by mouth 2 (two) times daily. Resume on 6/20 60 tablet 6  . azithromycin (ZITHROMAX) 250 MG tablet Take 1 tablet  (250 mg total) by mouth daily. 4 tablet 0  . calcium citrate-vitamin D (CITRACAL+D) 315-200 MG-UNIT tablet Take 1 tablet by mouth 2 (two) times daily.    . fluticasone (FLONASE) 50 MCG/ACT nasal spray Place 1 spray into both nostrils daily as needed for allergies.     . furosemide (LASIX) 40 MG tablet Take 1 tablet (40 mg total) by mouth daily. 90 tablet 1  . omeprazole (PRILOSEC) 40 MG capsule Take 1 capsule (40 mg total) by mouth daily. (Patient taking differently: Take 40 mg by mouth daily as needed (for acid reflux). ) 30 capsule 0  . potassium chloride SA (KLOR-CON) 20 MEQ tablet Take 1 tablet (20 mEq total) by mouth daily. 90 tablet 3  . traZODone (DESYREL) 50 MG tablet Take 50 mg by mouth at bedtime as needed for sleep.     . verapamil (CALAN) 40 MG tablet Take 1 tablet (40 mg total) by mouth 3 (three) times daily. (Patient taking differently: Take 40 mg by mouth daily. )      Allergies:    Allergies  Allergen Reactions  . Cardizem Cd [Diltiazem Hcl Er Beads] Diarrhea  . Amiodarone Nausea And Vomiting  . Metoprolol Tartrate Diarrhea  . Sulfa Antibiotics Nausea And Vomiting  . Tomato Other (See Comments)    Burns stomach  .  Carvedilol Diarrhea and Rash  . Chocolate Rash and Other (See Comments)    Dark chocolate    Social History:   Social History   Tobacco Use  . Smoking status: Former Smoker    Packs/day: 1.00    Years: 28.00    Pack years: 28.00    Types: Cigarettes    Quit date: 07/17/1983    Years since quitting: 36.1  . Smokeless tobacco: Never Used  Substance Use Topics  . Alcohol use: No    Family History:   The patient's family history includes Aneurysm in her father; Cancer in her mother, sister, and sister. There is no history of Anesthesia problems, Hypotension, Malignant hyperthermia, Pseudochol deficiency, or Colon cancer.  ROS:  Reviewed and as described in history of present illness.  No syncope.  Physical Exam/Data:   Vitals:   09/18/19 1021  09/18/19 1100 09/18/19 1130 09/18/19 1200  BP: (!) 111/33 (!) 109/55 (!) 103/47 107/64  Pulse: (!) 102 84 86 89  Resp: (!) 22 (!) 26 (!) 23 (!) 23  Temp: 98 F (36.7 C)     TempSrc: Oral     SpO2: 96% 98% 98% 91%  Weight: 65.8 kg     Height: 5\' 4"  (1.626 m)      No intake or output data in the 24 hours ending 09/18/19 1231 Filed Weights   09/18/19 1021  Weight: 65.8 kg   Body mass index is 24.89 kg/m.   Gen: Chronically ill-appearing frail elderly woman, mildly short of breath when speaking. HEENT: Conjunctiva and lids normal, wearing a mask. Neck: Supple, no elevated JVP or carotid bruits, no thyromegaly. Lungs: Decreased breath sounds throughout, particularly at the bases with scattered rhonchi, no wheezing. Cardiac: Indistinct PMI.  Irregularly irregular, no S3, soft systolic murmur, no pericardial rub. Abdomen: Soft, nontender, bowel sounds present. Extremities: 1-2+ lower leg edema, right greater than left. Skin: Warm and dry. Musculoskeletal: Mild kyphosis. Neuropsychiatric: Alert and oriented x3, affect grossly appropriate.  EKG:  An ECG dated 09/18/2019 was personally reviewed today and demonstrated:  Atrial fibrillation with low voltage, leftward axis, nonspecific ST-T changes.  Telemetry:  I personally reviewed telemetry which shows atrial fibrillation.  Relevant CV Studies:  Echocardiogram 09/24/2016: Study Conclusions   - Left ventricle: The cavity size was normal. Wall thickness was  normal. Systolic function was normal. The estimated ejection  fraction was in the range of 55% to 60%. Wall motion was normal;  there were no regional wall motion abnormalities.  - Aortic valve: Mildly to moderately calcified annulus. Mildly  thickened leaflets. Valve area (VTI): 2.92 cm^2. Valve area  (Vmax): 2.73 cm^2. Valve area (Vmean): 2.61 cm^2.  - Mitral valve: There was moderate regurgitation.  - Left atrium: The atrium was severely dilated.  - Right atrium: The  atrium was severely dilated.  - Atrial septum: No defect or patent foramen ovale was identified.  - Pulmonary arteries: Systolic pressure was moderately increased.  PA peak pressure: 49 mm Hg (S).  - Pericardium, extracardiac: There is a small circumferential  pericardial effusion.  - Technically adequate study.   Laboratory Data:  Chemistry Recent Labs  Lab 09/18/19 1116  NA 133*  K 2.9*  CL 94*  CO2 28  GLUCOSE 97  BUN 11  CREATININE 1.50*  CALCIUM 8.4*  GFRNONAA 33*  GFRAA 38*  ANIONGAP 11    No results for input(s): PROT, ALBUMIN, AST, ALT, ALKPHOS, BILITOT in the last 168 hours. Hematology Recent Labs  Lab 09/18/19 1117  WBC 8.6  RBC 3.11*  HGB 8.3*  HCT 27.0*  MCV 86.8  MCH 26.7  MCHC 30.7  RDW 15.6*  PLT 297   Cardiac Enzymes Recent Labs  Lab 09/03/19 0955 09/03/19 1206 09/10/19 1702  TROPONINIHS 15 13 12    BNP Recent Labs  Lab 09/18/19 1116  BNP 822.0*     Radiology/Studies:  DG Chest Port 1 View  Result Date: 09/18/2019 CLINICAL DATA:  Dyspnea for 2 weeks EXAM: PORTABLE CHEST 1 VIEW COMPARISON:  09/10/2019 chest radiograph. FINDINGS: Stable cardiomediastinal silhouette with moderate cardiomegaly. No pneumothorax. Suggestion of small stable bilateral pleural effusions. New borderline mild pulmonary edema. Stable mild bibasilar scarring versus atelectasis. IMPRESSION: 1. New borderline mild congestive heart failure. 2. Stable small bilateral pleural effusions with stable mild bibasilar scarring versus atelectasis. Electronically Signed   By: Ilona Sorrel M.D.   On: 09/18/2019 11:37    Assessment and Plan:   1.  Presentation with worsening shortness of breath, leg swelling, evidence of fluid overload with BNP 822 and chest x-ray showing moderately enlarged cardiac silhouette with small bilateral pleural effusions and pulmonary edema.  She is in atrial fibrillation with heart rates not substantially elevated on present regimen, also takes  low-dose Lasix at home.  Last assessment of cardiac structure and function was in 2018.  She does not report any active chest pain, ECG without acute ST segment changes.  At this point suspect heart failure, diastolic versus new onset systolic.  2.  Longstanding history of persistent atrial fibrillation with intolerance to amiodarone, failed treatment with Tikosyn, and multiple drug intolerances to various AV nodal blockers as described above.  She has a severely dilated left atrium as of echocardiogram in 2018.  At the present time she is on verapamil and Eliquis.  CHA2DS2-VASc score is at least 4.  3.  Essential hypertension, currently not on any antihypertensive therapy other than verapamil which is being used for heart rate control and atrial fibrillation.  4.  CKD stage IIIb, current creatinine 1.5.  5.  Hypokalemia, potassium 2.9 on Lasix as an outpatient and KCl 20 mEq daily.  6.  Mitral regurgitation, moderate by echocardiogram in 2018.  7.  Chronic anemia, recent GI work-up noted for acute on chronic symptomatic anemia.  She underwent EGD and colonoscopy with evidence of gastritis and several colonic polyps which were resected, but no active signs of bleeding.  She has been continued on Eliquis.  Recommend admission to the hospitalist service for further evaluation of her symptoms.  Echocardiogram will be obtained to reevaluate cardiac structure and function.  Continue verapamil and Eliquis for now, heart rate control is reasonable at this time.  If she ends up having LV dysfunction, beta-blocker therapy for heart rate control would be a better choice, but this is likely to be significantly inhibited by her drug intolerance history.  Strategy of heart rate control and anticoagulation makes the most sense at this point, it is very unlikely that she will maintain sinus rhythm given severe left atrial enlargement and her antiarrhythmic drug intolerances.  Electrolytes need to be repleted.  Would  start her on IV Lasix for further diuresis and follow renal function.  Signed, Rozann Lesches, MD  09/18/2019 12:31 PM

## 2019-09-19 DIAGNOSIS — D638 Anemia in other chronic diseases classified elsewhere: Secondary | ICD-10-CM

## 2019-09-19 DIAGNOSIS — I482 Chronic atrial fibrillation, unspecified: Secondary | ICD-10-CM

## 2019-09-19 LAB — BASIC METABOLIC PANEL
Anion gap: 12 (ref 5–15)
BUN: 12 mg/dL (ref 8–23)
CO2: 26 mmol/L (ref 22–32)
Calcium: 8.7 mg/dL — ABNORMAL LOW (ref 8.9–10.3)
Chloride: 97 mmol/L — ABNORMAL LOW (ref 98–111)
Creatinine, Ser: 1.49 mg/dL — ABNORMAL HIGH (ref 0.44–1.00)
GFR calc Af Amer: 38 mL/min — ABNORMAL LOW (ref >60)
GFR calc non Af Amer: 33 mL/min — ABNORMAL LOW (ref >60)
Glucose, Bld: 78 mg/dL (ref 70–99)
Potassium: 3.8 mmol/L (ref 3.5–5.1)
Sodium: 135 mmol/L (ref 135–145)

## 2019-09-19 MED ORDER — DIPHENOXYLATE-ATROPINE 2.5-0.025 MG PO TABS
1.0000 | ORAL_TABLET | Freq: Four times a day (QID) | ORAL | Status: DC | PRN
  Administered 2019-09-19 – 2019-09-23 (×2): 1 via ORAL
  Filled 2019-09-19 (×2): qty 1

## 2019-09-19 MED ORDER — BISOPROLOL FUMARATE 5 MG PO TABS
5.0000 mg | ORAL_TABLET | Freq: Every day | ORAL | Status: DC
  Administered 2019-09-19 – 2019-09-23 (×5): 5 mg via ORAL
  Filled 2019-09-19 (×5): qty 1

## 2019-09-19 NOTE — Progress Notes (Signed)
PROGRESS NOTE    Bailey Bishop  WUX:[id number] DOB: 07/08/1940 DOA: 09/18/2019 PCP: [name], MD   Chief Complaint  Patient presents with  . Shortness of Breath    Brief Narrative:  Bailey Bishop is a 79 y.o. female with medical history significant of A. Fib (on eliquis), diastolic HF, GERD, anxiety, HTN, CKD stage 3b, hx of right breast cancer (status post bilateral mastectomy), asthma (mild and intermittent), recent admission around 09/03/19--09/06/19 due to SOB and findings of anemia at that time (no transfusion needed and no active source of bleeding appreciated); patient was subsequently seen on 09/10/19 due to similar complaints at the ED and treated for bronchitis and discharge home. Patient presented to Ed today with complaints of worsening SOB, LE edema and orthopnea. Patient reported that over the last 3 weeks her symptoms has continue worsening and now she is SOB even at rest and noticing some orthopnea (needing 1 pillow at time to sleep); patient also expressed some swelling on her legs and on arrival to ED O2 sat was 87% on RA (improved to mid 90's with 2L). Patient reported increased fatigue and deconditioned; she denies fever, chills, CP, nausea, vomiting, dysuria, hematuria, focal weakness, melena, hematochezia or any other complaints.  Patient reported to be compliant with her meds and also with low sodium diet, denies palpitations.  ED Course: BNP elevated, CXR with vascular congestion and finfigs of early pulmonary edema, Hgb stable and renal function WNL for her. Positive hypokalemia. Cardiology service consulted and recommended admission for CHF exacerbation. Potassium repletion started in ED and 2-D echo ordered.   Assessment & Plan: 1-acute on chronic combined systolic heart failure -Repeat echo has demonstrated decrease in ejection fraction and severe global hypokinesis -Verapamil will be discontinued in the setting of decreased ejection fraction patient  started on bisoprolol 5 mg daily with intention to titrate as tolerated for further rate control and to assist with proper treatment of heart failure. -Continue IV diuresis -Follow daily weights, low-sodium diet and strict I's and O's -Follow clinical response and recommendations by cardiology service.  2-diarrhea -Appears to be functional in nature; no signs or complaints suggesting infection component. -Will treat symptomatically with Lomotil. -Patient advised to maintain adequate hydration.  3-history of atrial fibrillation -Rate controlled and stable -Continue Eliquis for secondary prevention -Previously was using verapamil; now started on bisoprolol. -Follow cardiology recommendations.  4-hypokalemia -Magnesium within normal limits -Continue to replete as needed -Continue telemetry monitoring -Follow electrolytes trend.  5-GERD -Continue PPI.  6-history of asthma -No wheezing currently -Continue monitoring only -As needed nebulizer has been ordered.  7-anemia of chronic kidney disease -Appears stable -No signs of overt bleeding; some hemodilution in the setting of fluid overload most likely playing a role. -Follow hemoglobin trend.  8-chronic kidney disease a stage IIIb -Appears to be stable at baseline -Continue to closely monitor renal function in the setting of acute diuresis.  9-history of breast cancer -Status post bilateral mastectomy -Continue surveillance as an outpatient  10-history of anxiety -A stable mood -Continue as needed Xanax.   DVT prophylaxis: Chronically on Eliquis. Code Status: Full code Family Communication: No family at bedside.  Disposition:  Status is: Inpatient   Dispo: The patient is from: Home              Anticipated d/c is to: Hopefully discharge back home              Anticipated d/c date is: 6/29  Patient currently not medically stable for discharge secondary to complaints of shortness of breath, orthopnea,  fine crackles on auscultation and requiring oxygen supplementation.  We will continue IV diuresis, follow cardiology service recommendations and change the use of verapamil for bisoprolol.  Continue to follow clinical response.       Consultants:   Cardiology service   Procedures:  See below for x-ray report 2D echo: Demonstrating global hypokinesis, ejection fraction 35 to 40%.  Mildly reduced right ventricular contraction, moderate mitral and tricuspid regurgitation and a relatively large posterior pericardial effusion with a smaller collection anteriorly and no clear evidence of tamponade.   Antimicrobials:  None   Subjective: Reports having some diarrhea, no nausea, no vomiting, no abdominal pain.  Still short of breath and complaining of orthopnea.  2-3 L nasal supplementation in place.  Objective: Vitals:   09/18/19 2323 09/19/19 0250 09/19/19 0314 09/19/19 0710  BP: 126/84  121/70 100/66  Pulse: (!) 102  99 97  Resp: 16  16 16   Temp: 98.7 F (37.1 C)  97.7 F (36.5 C) 98.3 F (36.8 C)  TempSrc: Oral  Oral Oral  SpO2: 99%  99% 100%  Weight:  67.7 kg    Height:        Intake/Output Summary (Last 24 hours) at 09/19/2019 1723 Last data filed at 09/19/2019 0900 Gross per 24 hour  Intake 340 ml  Output 200 ml  Net 140 ml   Filed Weights   09/18/19 1021 09/18/19 2045 09/19/19 0250  Weight: 65.8 kg 68.2 kg 67.7 kg    Examination: General exam: No fever, no chest pain, no nausea, no vomiting, reports no palpitations.  Still feeling short of breath and reporting orthopnea.  2-3 L nasal supplementation in place.  Patient reports having diarrhea. Respiratory system: Fine crackles at the bases, fair air movement bilaterally, no wheezing.  No using accessory muscle. Cardiovascular system: Rate controlled, positive systolic ejection murmur, no rubs, no gallops. Gastrointestinal system: Abdomen is nondistended, soft and nontender. No organomegaly or masses felt. Normal  bowel sounds heard. Central nervous system: Alert and oriented. No focal neurological deficits. Extremities: Trace edema bilaterally, no cyanosis or clubbing. Skin: No rashes, no petechiae. Psychiatry: Judgement and insight appear normal. Mood & affect appropriate.    Data Reviewed: I have personally reviewed following labs and imaging studies  CBC: Recent Labs  Lab 09/18/19 1117  WBC 8.6  NEUTROABS 6.6  HGB 8.3*  HCT 27.0*  MCV 86.8  PLT 045    Basic Metabolic Panel: Recent Labs  Lab 09/18/19 1116 09/18/19 1624 09/19/19 0451  NA 133*  --  135  K 2.9*  --  3.8  CL 94*  --  97*  CO2 28  --  26  GLUCOSE 97  --  78  BUN 11  --  12  CREATININE 1.50*  --  1.49*  CALCIUM 8.4*  --  8.7*  MG 2.0 2.2  --     GFR: Estimated Creatinine Clearance: 28.5 mL/min (A) (by C-G formula based on SCr of 1.49 mg/dL (H)).  CBG: No results for input(s): GLUCAP in the last 168 hours.   Recent Results (from the past 240 hour(s))  SARS Coronavirus 2 by RT PCR (hospital order, performed in [facility] hospital lab) Nasopharyngeal Nasopharyngeal Swab     Status: None   Collection Time: 09/18/19  1:27 PM   Specimen: Nasopharyngeal Swab  Result Value Ref Range Status   SARS Coronavirus 2 NEGATIVE NEGATIVE Final  Comment: (NOTE) SARS-CoV-2 target nucleic acids are NOT DETECTED.  The SARS-CoV-2 RNA is generally detectable in upper and lower respiratory specimens during the acute phase of infection. The lowest concentration of SARS-CoV-2 viral copies this assay can detect is 250 copies / mL. A negative result does not preclude SARS-CoV-2 infection and should not be used as the sole basis for treatment or other patient management decisions.  A negative result may occur with improper specimen collection / handling, submission of specimen other than nasopharyngeal swab, presence of viral mutation(s) within the areas targeted by this assay, and inadequate number of viral copies (<250  copies / mL). A negative result must be combined with clinical observations, patient history, and epidemiological information.  Fact Sheet for Patients:   StrictlyIdeas.no  Fact Sheet for Healthcare Providers: BankingDealers.co.za  This test is not yet approved or  cleared by the [place] FDA and has been authorized for detection and/or diagnosis of SARS-CoV-2 by FDA under an Emergency Use Authorization (EUA).  This EUA will remain in effect (meaning this test can be used) for the duration of the COVID-19 declaration under Section 564(b)(1) of the Act, 21 U.S.C. section 360bbb-3(b)(1), unless the authorization is terminated or revoked sooner.  Performed at [facility], [address], [place], [place] [address]      Radiology Studies: [facility] Chest [facility] 1 View  Result Date: 09/18/2019 CLINICAL DATA:  Dyspnea for 2 weeks EXAM: PORTABLE CHEST 1 VIEW COMPARISON:  09/10/2019 chest radiograph. FINDINGS: Stable cardiomediastinal silhouette with moderate cardiomegaly. No pneumothorax. Suggestion of small stable bilateral pleural effusions. New borderline mild pulmonary edema. Stable mild bibasilar scarring versus atelectasis. IMPRESSION: 1. New borderline mild congestive heart failure. 2. Stable small bilateral pleural effusions with stable mild bibasilar scarring versus atelectasis. Electronically Signed   By: Ilona Sorrel M.D.   On: 09/18/2019 11:37   ECHOCARDIOGRAM COMPLETE  Result Date: 09/18/2019    ECHOCARDIOGRAM REPORT   Patient Name:   KASHARA BLOCHER Date of Exam: 09/18/2019 Medical Rec #:  [id number]          Height:       64.0 in Accession #:    [id number]         Weight:       145.0 lb Date of Birth:  1940-07-26          BSA:          1.706 m Patient Age:    78 years           BP:           105/64 mmHg Patient Gender: F                  HR:           88 bpm. Exam Location:  [facility] Procedure: 2D Echo, Cardiac Doppler and Color Doppler  Indications:    Dyspnea  History:        Patient has prior history of Echocardiogram examinations, most                 recent 09/24/2016. CHF, Arrythmias:Atrial Fibrillation,                 Signs/Symptoms:Shortness of Breath; Risk Factors:Hypertension.                 CKD, LE edema.  Sonographer:    Dustin Flock RDCS Referring Phys: [place]  1. Left ventricular ejection fraction, by estimation, is 35 to  40%. The left ventricle has moderately decreased function. The left ventricle demonstrates global hypokinesis. Left ventricular diastolic parameters are indeterminate.  2. Right ventricular systolic function is mildly reduced. The right ventricular size is mildly enlarged. There is normal pulmonary artery systolic pressure. The estimated right ventricular systolic pressure is 70.3 mmHg.  3. Left atrial size was moderately dilated.  4. Right atrial size was severely dilated.  5. Large pericardial effusion. The pericardial effusion is posterior to the left ventricle. No obvious RV compromise to suggest tamponade and there are signs of chronicity. Small anterior effusion.  6. The mitral valve is grossly normal. Moderate mitral valve regurgitation.  7. Tricuspid valve regurgitation is moderate.  8. The aortic valve is tricuspid. Aortic valve regurgitation is not visualized. Mild to moderate aortic valve sclerosis/calcification is present, without any evidence of aortic stenosis. Moderate nodular calcification of the noncoronary cusp.  9. The inferior vena cava is dilated in size with <50% respiratory variability, suggesting right atrial pressure of 15 mmHg. FINDINGS  Left Ventricle: Left ventricular ejection fraction, by estimation, is 35 to 40%. The left ventricle has moderately decreased function. The left ventricle demonstrates global hypokinesis. The left ventricular internal cavity size was normal in size. There is no left ventricular hypertrophy. Left ventricular diastolic parameters  are indeterminate. Right Ventricle: The right ventricular size is mildly enlarged. No increase in right ventricular wall thickness. Right ventricular systolic function is mildly reduced. There is normal pulmonary artery systolic pressure. The tricuspid regurgitant velocity  is 2.13 m/s, and with an assumed right atrial pressure of 15 mmHg, the estimated right ventricular systolic pressure is 50.0 mmHg. Left Atrium: Left atrial size was moderately dilated. Right Atrium: Right atrial size was severely dilated. Pericardium: A large pericardial effusion is present. The pericardial effusion is posterior to the left ventricle. Mitral Valve: The mitral valve is grossly normal. Mild mitral annular calcification. Moderate mitral valve regurgitation. Tricuspid Valve: The tricuspid valve is grossly normal. Tricuspid valve regurgitation is moderate. Aortic Valve: The aortic valve is tricuspid. Aortic valve regurgitation is not visualized. Mild to moderate aortic valve sclerosis/calcification is present, without any evidence of aortic stenosis. Mild aortic valve annular calcification. Pulmonic Valve: The pulmonic valve was grossly normal. Pulmonic valve regurgitation is trivial. Aorta: The aortic root is normal in size and structure. Venous: The inferior vena cava is dilated in size with less than 50% respiratory variability, suggesting right atrial pressure of 15 mmHg. IAS/Shunts: The atrial septum is grossly normal.  LEFT VENTRICLE PLAX 2D LVIDd:         4.90 cm  Diastology LVIDs:         3.91 cm  LV e' lateral:   10.30 cm/s LV PW:         0.95 cm  LV E/e' lateral: 9.2 LV IVS:        0.99 cm  LV e' medial:    8.49 cm/s LVOT diam:     2.00 cm  LV E/e' medial:  11.1 LV SV:         36 LV SV Index:   21 LVOT Area:     3.14 cm  RIGHT VENTRICLE RV Basal diam:  3.97 cm RV S prime:     8.16 cm/s TAPSE (M-mode): 2.2 cm LEFT ATRIUM             Index       RIGHT ATRIUM           Index LA diam:  3.90 cm 2.29 cm/m  RA Area:      26.40 cm LA Vol (A2C):   77.0 ml 45.12 ml/m RA Volume:   91.30 ml  53.50 ml/m LA Vol (A4C):   76.6 ml 44.89 ml/m LA Biplane Vol: 80.8 ml 47.35 ml/m  AORTIC VALVE LVOT Vmax:   74.60 cm/s LVOT Vmean:  46.700 cm/s LVOT VTI:    0.116 m  AORTA Ao Root diam: 3.20 cm MITRAL VALVE               TRICUSPID VALVE MV Area (PHT): 6.71 cm    TR Peak grad:   18.1 mmHg MV Decel Time: 113 msec    TR Vmax:        213.00 cm/s MV E velocity: 94.60 cm/s MV A velocity: 30.10 cm/s  SHUNTS MV E/A ratio:  3.14        Systemic VTI:  0.12 m                            Systemic Diam: 2.00 cm Rozann Lesches MD Electronically signed by Rozann Lesches MD Signature Date/Time: 09/18/2019/4:12:26 PM    Final     Scheduled Meds: . apixaban  2.5 mg Oral BID  . bisoprolol  5 mg Oral Daily  . calcium-vitamin D  1 tablet Oral BID  . furosemide  40 mg Intravenous Q12H  . pantoprazole  40 mg Oral Daily  . potassium chloride  20 mEq Oral Daily  . sodium chloride flush  3 mL Intravenous Q12H   Continuous Infusions: . sodium chloride       LOS: 1 day    Time spent: 30 minutes   Barton Dubois, MD [facility]   To contact the attending provider between 7A-7P or the covering provider during after hours 7P-7A, please log into the web site [email or web address] and access using universal [place] password for that web site. If you do not have the password, please call the hospital operator.  09/19/2019, 5:23 PM

## 2019-09-20 LAB — BASIC METABOLIC PANEL
Anion gap: 12 (ref 5–15)
BUN: 14 mg/dL (ref 8–23)
CO2: 27 mmol/L (ref 22–32)
Calcium: 8.9 mg/dL (ref 8.9–10.3)
Chloride: 95 mmol/L — ABNORMAL LOW (ref 98–111)
Creatinine, Ser: 1.61 mg/dL — ABNORMAL HIGH (ref 0.44–1.00)
GFR calc Af Amer: 35 mL/min — ABNORMAL LOW (ref >60)
GFR calc non Af Amer: 30 mL/min — ABNORMAL LOW (ref >60)
Glucose, Bld: 83 mg/dL (ref 70–99)
Potassium: 3.8 mmol/L (ref 3.5–5.1)
Sodium: 134 mmol/L — ABNORMAL LOW (ref 135–145)

## 2019-09-20 MED ORDER — PROCHLORPERAZINE EDISYLATE 10 MG/2ML IJ SOLN
10.0000 mg | Freq: Four times a day (QID) | INTRAMUSCULAR | Status: DC | PRN
  Administered 2019-09-20 – 2019-09-21 (×2): 10 mg via INTRAVENOUS
  Filled 2019-09-20 (×2): qty 2

## 2019-09-20 NOTE — Progress Notes (Signed)
PROGRESS NOTE    Bailey Bishop  ZDG:[id number] DOB: 04-Aug-1940 DOA: 09/18/2019 PCP: [name], MD   Chief Complaint  Patient presents with  . Shortness of Breath    Brief Narrative:  Bailey Bishop is a 79 y.o. female with medical history significant of A. Fib (on eliquis), diastolic HF, GERD, anxiety, HTN, CKD stage 3b, hx of right breast cancer (status post bilateral mastectomy), asthma (mild and intermittent), recent admission around 09/03/19--09/06/19 due to SOB and findings of anemia at that time (no transfusion needed and no active source of bleeding appreciated); patient was subsequently seen on 09/10/19 due to similar complaints at the ED and treated for bronchitis and discharge home. Patient presented to Ed today with complaints of worsening SOB, LE edema and orthopnea. Patient reported that over the last 3 weeks her symptoms has continue worsening and now she is SOB even at rest and noticing some orthopnea (needing 1 pillow at time to sleep); patient also expressed some swelling on her legs and on arrival to ED O2 sat was 87% on RA (improved to mid 90's with 2L). Patient reported increased fatigue and deconditioned; she denies fever, chills, CP, nausea, vomiting, dysuria, hematuria, focal weakness, melena, hematochezia or any other complaints.  Patient reported to be compliant with her meds and also with low sodium diet, denies palpitations.  ED Course: BNP elevated, CXR with vascular congestion and finfigs of early pulmonary edema, Hgb stable and renal function WNL for her. Positive hypokalemia. Cardiology service consulted and recommended admission for CHF exacerbation. Potassium repletion started in ED and 2-D echo ordered.   Assessment & Plan: 1-acute on chronic combined systolic heart failure -Repeat echo has demonstrated decrease in ejection fraction and severe global hypokinesis -Verapamil will be discontinued in the setting of decreased ejection fraction patient  started on bisoprolol 5 mg daily with intention to titrate as tolerated for further rate control and to assist with proper treatment of heart failure. -will continue IV diuresis -Follow daily weights, low-sodium diet and strict I's and O's -Follow clinical response and recommendations by cardiology service.  2-diarrhea/nausea -Appears to be functional in nature; no signs or complaints suggesting infection component. -Will treat symptomatically with Lomotil. -Patient advised to maintain adequate hydration. -Continue as needed antiemetics.  3-history of atrial fibrillation -Rate controlled and stable -Continue Eliquis for secondary prevention -Previously was using verapamil; now on bisoprolol. -Follow cardiology recommendations. -Continue telemetry monitoring.  4-hypokalemia -Magnesium within normal limits -Continue to replete as needed -Continue telemetry monitoring -Follow electrolytes trend.  5-GERD -Continue PPI.  6-history of asthma -No wheezing currently -Continue monitoring only -As needed nebulizer has been ordered.  7-anemia of chronic kidney disease -Appears stable -No signs of overt bleeding; some hemodilution in the setting of fluid overload most likely playing a role. -Follow hemoglobin trend.  8-chronic kidney disease a stage IIIb -Appears to be stable at baseline -Continue to closely monitor renal function in the setting of acute diuresis.  9-history of breast cancer -Status post bilateral mastectomy -Continue surveillance as an outpatient  10-history of anxiety -A stable mood -Continue as needed Xanax.   DVT prophylaxis: Chronically on Eliquis. Code Status: Full code Family Communication: No family at bedside.  Disposition:  Status is: Inpatient   Dispo: The patient is from: Home              Anticipated d/c is to: Hopefully discharge back home              Anticipated d/c date is:  6/29              Patient currently not medically stable for  discharge secondary to complaints of shortness of breath, orthopnea, fine crackles on auscultation and requiring oxygen supplementation.  We will continue IV diuresis, follow cardiology service recommendations and change the use of verapamil for bisoprolol.  Continue to follow clinical response.       Consultants:   Cardiology service   Procedures:  See below for x-ray report 2D echo: Demonstrating global hypokinesis, ejection fraction 35 to 40%.  Mildly reduced right ventricular contraction, moderate mitral and tricuspid regurgitation and a relatively large posterior pericardial effusion with a smaller collection anteriorly and no clear evidence of tamponade.   Antimicrobials:  None   Subjective: Reports shortness of breath on exertion, using 2 L nasal cannula supplementation and expressing nausea.  Objective: Vitals:   09/19/19 2102 09/20/19 0316 09/20/19 0345 09/20/19 1435  BP: 108/65  (!) 101/59 (!) 100/56  Pulse: 97  85 86  Resp: 16  (!) 22 18  Temp: 98.9 F (37.2 C)  98.8 F (37.1 C)   TempSrc: Oral  Oral   SpO2: 100%  99% 99%  Weight:  66.7 kg    Height:        Intake/Output Summary (Last 24 hours) at 09/20/2019 1742 Last data filed at 09/20/2019 1436 Gross per 24 hour  Intake 1110 ml  Output --  Net 1110 ml   Filed Weights   09/18/19 2045 09/19/19 0250 09/20/19 0316  Weight: 68.2 kg 67.7 kg 66.7 kg    Examination: General exam: Alert, awake, oriented x 3, reports leg swelling and breathing are improving; no much orthopnea currently.  Still having SOB on exertion and having  Nausea.  2 L nasal supplementation in place. Respiratory system: No wheezing, decreased breath sounds at the bases, no using accessory muscles. Cardiovascular system: Irregular, rate controlled; positive murmur, no rubs, no gallops, no JVD appreciated. Gastrointestinal system: Abdomen is nondistended, soft and nontender. No organomegaly or masses felt. Normal bowel sounds heard. Central  nervous system: Alert and oriented. No focal neurological deficits. Extremities: No cyanosis or clubbing; trace edema bilaterally. Skin: No rashes, no petechiae. Psychiatry: Judgement and insight appear normal. Mood & affect appropriate.    Data Reviewed: I have personally reviewed following labs and imaging studies  CBC: Recent Labs  Lab 09/18/19 1117  WBC 8.6  NEUTROABS 6.6  HGB 8.3*  HCT 27.0*  MCV 86.8  PLT 026    Basic Metabolic Panel: Recent Labs  Lab 09/18/19 1116 09/18/19 1624 09/19/19 0451 09/20/19 0448  NA 133*  --  135 134*  K 2.9*  --  3.8 3.8  CL 94*  --  97* 95*  CO2 28  --  26 27  GLUCOSE 97  --  78 83  BUN 11  --  12 14  CREATININE 1.50*  --  1.49* 1.61*  CALCIUM 8.4*  --  8.7* 8.9  MG 2.0 2.2  --   --     GFR: Estimated Creatinine Clearance: 26.2 mL/min (A) (by C-G formula based on SCr of 1.61 mg/dL (H)).   Recent Results (from the past 240 hour(s))  SARS Coronavirus 2 by RT PCR (hospital order, performed in [facility] hospital lab) Nasopharyngeal Nasopharyngeal Swab     Status: None   Collection Time: 09/18/19  1:27 PM   Specimen: Nasopharyngeal Swab  Result Value Ref Range Status   SARS Coronavirus 2 NEGATIVE NEGATIVE Final  Comment: (NOTE) SARS-CoV-2 target nucleic acids are NOT DETECTED.  The SARS-CoV-2 RNA is generally detectable in upper and lower respiratory specimens during the acute phase of infection. The lowest concentration of SARS-CoV-2 viral copies this assay can detect is 250 copies / mL. A negative result does not preclude SARS-CoV-2 infection and should not be used as the sole basis for treatment or other patient management decisions.  A negative result may occur with improper specimen collection / handling, submission of specimen other than nasopharyngeal swab, presence of viral mutation(s) within the areas targeted by this assay, and inadequate number of viral copies (<250 copies / mL). A negative result must be  combined with clinical observations, patient history, and epidemiological information.  Fact Sheet for Patients:   StrictlyIdeas.no  Fact Sheet for Healthcare Providers: BankingDealers.co.za  This test is not yet approved or  cleared by the [place] FDA and has been authorized for detection and/or diagnosis of SARS-CoV-2 by FDA under an Emergency Use Authorization (EUA).  This EUA will remain in effect (meaning this test can be used) for the duration of the COVID-19 declaration under Section 564(b)(1) of the Act, 21 U.S.C. section 360bbb-3(b)(1), unless the authorization is terminated or revoked sooner.  Performed at [name], [address], [place], [place] [address]      Radiology Studies: No results found.  Scheduled Meds: . apixaban  2.5 mg Oral BID  . bisoprolol  5 mg Oral Daily  . calcium-vitamin D  1 tablet Oral BID  . furosemide  40 mg Intravenous Q12H  . pantoprazole  40 mg Oral Daily  . potassium chloride  20 mEq Oral Daily  . sodium chloride flush  3 mL Intravenous Q12H   Continuous Infusions: . sodium chloride       LOS: 2 days    Time spent: 30 minutes   Barton Dubois, MD [facility]   To contact the attending provider between 7A-7P or the covering provider during after hours 7P-7A, please log into the web site [email or web address] and access using universal [place] password for that web site. If you do not have the password, please call the hospital operator.  09/20/2019, 5:42 PM

## 2019-09-21 DIAGNOSIS — I5021 Acute systolic (congestive) heart failure: Secondary | ICD-10-CM

## 2019-09-21 LAB — BASIC METABOLIC PANEL
Anion gap: 12 (ref 5–15)
BUN: 17 mg/dL (ref 8–23)
CO2: 27 mmol/L (ref 22–32)
Calcium: 8.8 mg/dL — ABNORMAL LOW (ref 8.9–10.3)
Chloride: 94 mmol/L — ABNORMAL LOW (ref 98–111)
Creatinine, Ser: 1.94 mg/dL — ABNORMAL HIGH (ref 0.44–1.00)
GFR calc Af Amer: 28 mL/min — ABNORMAL LOW (ref >60)
GFR calc non Af Amer: 24 mL/min — ABNORMAL LOW (ref >60)
Glucose, Bld: 103 mg/dL — ABNORMAL HIGH (ref 70–99)
Potassium: 3.8 mmol/L (ref 3.5–5.1)
Sodium: 133 mmol/L — ABNORMAL LOW (ref 135–145)

## 2019-09-21 MED ORDER — FUROSEMIDE 40 MG PO TABS: 40.0000 mg | ORAL_TABLET | Freq: Every day | ORAL | Status: DC

## 2019-09-21 MED ORDER — MECLIZINE HCL 12.5 MG PO TABS
25.0000 mg | ORAL_TABLET | Freq: Once | ORAL | Status: AC
  Administered 2019-09-21: 25 mg via ORAL
  Filled 2019-09-21: qty 2

## 2019-09-21 NOTE — Progress Notes (Signed)
Orthostatic vitals are as follows     09/21/19 0902  Orthostatic Lying   BP- Lying 122/70  Pulse- Lying 78  Orthostatic Sitting  BP- Sitting 122/71  Pulse- Sitting 78  Orthostatic Standing at 0 minutes  BP- Standing at 0 minutes 118/64  Pulse- Standing at 0 minutes 71  Orthostatic Standing at 3 minutes  BP- Standing at 3 minutes 123/79  Pulse- Standing at 3 minutes 83

## 2019-09-21 NOTE — Progress Notes (Addendum)
Progress Note  Patient Name: Bailey Bishop Date of Encounter: 09/21/2019  [facility] Cardiologist: Kate Sable, MD   Subjective   "Feels like she's in a drunken stupor" can't think straight or clear her head. No dizziness or confusion. She received compazine yest 4pm for nausea but nothing else. Breathing is much better.  Inpatient Medications    Scheduled Meds: . apixaban  2.5 mg Oral BID  . bisoprolol  5 mg Oral Daily  . calcium-vitamin D  1 tablet Oral BID  . furosemide  40 mg Oral Daily  . pantoprazole  40 mg Oral Daily  . potassium chloride  20 mEq Oral Daily  . sodium chloride flush  3 mL Intravenous Q12H   Continuous Infusions: . sodium chloride     PRN Meds: sodium chloride, acetaminophen, ALPRAZolam, diphenoxylate-atropine, fluticasone, ondansetron (ZOFRAN) IV, prochlorperazine, sodium chloride flush   Vital Signs    Vitals:   09/20/19 1435 09/20/19 2132 09/21/19 0408 09/21/19 0535  BP: (!) 100/56 (!) 115/53  130/79  Pulse: 86 76  75  Resp: 18 16  18   Temp: 98.3 F (36.8 C) 98.3 F (36.8 C)  98 F (36.7 C)  TempSrc: Oral Oral  Oral  SpO2: 99% 99%  100%  Weight:   66.3 kg   Height:        Intake/Output Summary (Last 24 hours) at 09/21/2019 0832 Last data filed at 09/21/2019 0600 Gross per 24 hour  Intake 1200 ml  Output 500 ml  Net 700 ml   Last 3 Weights 09/21/2019 09/20/2019 09/19/2019  Weight (lbs) 146 lb 2.6 oz 147 lb 0.8 oz 149 lb 4 oz  Weight (kg) 66.3 kg 66.7 kg 67.7 kg      Telemetry    Afib with NSVT, PVC's - Personally Reviewed  ECG       Physical Exam    GEN: No acute distress.   Neck: No JVD Cardiac: irreg irreg 2/6 sys murmur apex, no rubs, or gallops.  Respiratory: few crackles at bases, decreased breath sounds throughout GI: Soft, nontender, non-distended  MS: No edema; No deformity. Neuro:  Nonfocal  Psych: Normal affect   Labs    High Sensitivity Troponin:   Recent Labs  Lab 09/03/19 0955  09/03/19 1206 09/10/19 1702  TROPONINIHS 15 13 12       Chemistry Recent Labs  Lab 09/19/19 0451 09/20/19 0448 09/21/19 0509  NA 135 134* 133*  K 3.8 3.8 3.8  CL 97* 95* 94*  CO2 26 27 27   GLUCOSE 78 83 103*  BUN 12 14 17   CREATININE 1.49* 1.61* 1.94*  CALCIUM 8.7* 8.9 8.8*  GFRNONAA 33* 30* 24*  GFRAA 38* 35* 28*  ANIONGAP 12 12 12      Hematology Recent Labs  Lab 09/18/19 1117  WBC 8.6  RBC 3.11*  HGB 8.3*  HCT 27.0*  MCV 86.8  MCH 26.7  MCHC 30.7  RDW 15.6*  PLT 297    BNP Recent Labs  Lab 09/18/19 1116  BNP 822.0*     DDimer No results for input(s): DDIMER in the last 168 hours.   Radiology    No results found.  Cardiac Studies   Echo 09/18/19 IMPRESSIONS     1. Left ventricular ejection fraction, by estimation, is 35 to 40%. The  left ventricle has moderately decreased function. The left ventricle  demonstrates global hypokinesis. Left ventricular diastolic parameters are  indeterminate.   2. Right ventricular systolic function is mildly reduced. The right  ventricular size is mildly enlarged. There is normal pulmonary artery  systolic pressure. The estimated right ventricular systolic pressure is  58.5 mmHg.   3. Left atrial size was moderately dilated.   4. Right atrial size was severely dilated.   5. Large pericardial effusion. The pericardial effusion is posterior to  the left ventricle. No obvious RV compromise to suggest tamponade and  there are signs of chronicity. Small anterior effusion.   6. The mitral valve is grossly normal. Moderate mitral valve  regurgitation.   7. Tricuspid valve regurgitation is moderate.   8. The aortic valve is tricuspid. Aortic valve regurgitation is not  visualized. Mild to moderate aortic valve sclerosis/calcification is  present, without any evidence of aortic stenosis. Moderate nodular  calcification of the noncoronary cusp.   9. The inferior vena cava is dilated in size with <50% respiratory   variability, suggesting right atrial pressure of 15 mmHg.  Echocardiogram 09/24/2016: Study Conclusions   - Left ventricle: The cavity size was normal. Wall thickness was    normal. Systolic function was normal. The estimated ejection    fraction was in the range of 55% to 60%. Wall motion was normal;    there were no regional wall motion abnormalities.  - Aortic valve: Mildly to moderately calcified annulus. Mildly    thickened leaflets. Valve area (VTI): 2.92 cm^2. Valve area    (Vmax): 2.73 cm^2. Valve area (Vmean): 2.61 cm^2.  - Mitral valve: There was moderate regurgitation.  - Left atrium: The atrium was severely dilated.  - Right atrium: The atrium was severely dilated.  - Atrial septum: No defect or patent foramen ovale was identified.  - Pulmonary arteries: Systolic pressure was moderately increased.    PA peak pressure: 49 mm Hg (S).  - Pericardium, extracardiac: There is a small circumferential    pericardial effusion.  - Technically adequate study.      Patient Profile     79 y.o. female  with a history of permanent atrial fibrillation, hypertension, chronic diastolic heart failure, and CKD stage IIIb who is being seen today for the evaluation of worsening shortness of breath and leg swelling at the request of [name].    Assessment & Plan    Acute systolic and diastolic CHF with New LVD EF 35-40% with global HK, mod MR and TR-trial of bisoprolol given(multiple medication intolerances I/O's positive 1230cc since admission-breathing improved and no significant CHF on exam. Doubt I/O's accurate. Weight 146-down 3 lbs. With rising Crt. Received IV lasix this AM, will d/c diuretic for now   Large post pericardial effusion-no evidence of tamponade-consider limited echo to re-evaluate  Persistent Atrial fib-intolerant to Amio, failed Tikosyn. On Eliquis 2.5 mg bid for CHADSVASC+4. Verapamil changed to bisoprolol. Hopefully she this isn't causing her symptoms. Will  continue to monitor  CKD-Crt 1.94 today up from 1.61 yest-would reduce lasix 40 mg once daily po  Essential HTN-running low today  Chronic anemia Hbg 8.3 6/25 with recentd EGD and colonoscopy-gastritis and polyps resected but no active bleed.       For questions or updates, please contact [place] Please consult [email or web address] for contact info under        Signed, Ermalinda Barrios, PA-C  09/21/2019, 8:32 AM    Patient seen and discussed with PA Bonnell Public, I agree with her documentation. She presented with acute HF, echo showed LVEF 35-40%, mild RV dysfunction, mod MR. I/Os are incomplete, weights appear inaccurate without a clear pattern, she is on IV  lasix changed from 40mg  bid to daily today, significant uptrend in Cr (labile baseline but appear to be 1.3-1.6, she is 1.9 today), hold any further diuretic. Renal function would prohibit considerations for ischemic testing at this time.   Medical therapy with bisoprolol 5mg  (reported side effects on coreg and metoprolol previously), no ACE/ARB/ARNI/Aldactone due to poor renal function and soft bp's at times. If renal funciton returns to baseline could start low dose aldactone. Depending bp's over time could consider hydral/nitrates.   History of persistent afib, failed tikosyn and intolerant to amio. Multiple drug intolerances. Verapamil stopped in setting of systolic dysfunction,started on bisoprolol 5mg  daily (note prior side effects metoprolol, coreg).    Large pericardial effusion with prior history of small effusion on echo. Would repeat limited echo over next few weeks  Nonspecific dizziness, perhaps from being intravascularly depleted. We will check orthostatics. Reports some feeling of the room spinning with layding down, trial of meclizine.    Carlyle Dolly MD

## 2019-09-21 NOTE — Care Management Important Message (Signed)
Important Message  Patient Details  Name: Bailey Bishop MRN: [id number] Date of Birth: 1940/05/20   Medicare Important Message Given:  Yes     Tommy Medal 09/21/2019, 3:39 PM

## 2019-09-21 NOTE — Progress Notes (Signed)
PROGRESS NOTE    Bailey Bishop  KDX:[id number] DOB: December 02, 1940 DOA: 09/18/2019 PCP: [name], MD   Chief Complaint  Patient presents with  . Shortness of Breath    Brief Narrative:  Bailey Bishop is a 79 y.o. female with medical history significant of A. Fib (on eliquis), diastolic HF, GERD, anxiety, HTN, CKD stage 3b, hx of right breast cancer (status post bilateral mastectomy), asthma (mild and intermittent), recent admission around 09/03/19--09/06/19 due to SOB and findings of anemia at that time (no transfusion needed and no active source of bleeding appreciated); patient was subsequently seen on 09/10/19 due to similar complaints at the ED and treated for bronchitis and discharge home. Patient presented to Ed today with complaints of worsening SOB, LE edema and orthopnea. Patient reported that over the last 3 weeks her symptoms has continue worsening and now she is SOB even at rest and noticing some orthopnea (needing 1 pillow at time to sleep); patient also expressed some swelling on her legs and on arrival to ED O2 sat was 87% on RA (improved to mid 90's with 2L). Patient reported increased fatigue and deconditioned; she denies fever, chills, CP, nausea, vomiting, dysuria, hematuria, focal weakness, melena, hematochezia or any other complaints.  Patient reported to be compliant with her meds and also with low sodium diet, denies palpitations.  ED Course: BNP elevated, CXR with vascular congestion and finfigs of early pulmonary edema, Hgb stable and renal function WNL for her. Positive hypokalemia. Cardiology service consulted and recommended admission for CHF exacerbation. Potassium repletion started in ED and 2-D echo ordered.   Assessment & Plan: 1-acute on chronic combined systolic heart failure -Repeat echo has demonstrated decrease in ejection fraction and severe global hypokinesis -Verapamil has been discontinued in the setting of decreased ejection fraction, patient  started on bisoprolol 5 mg daily with intention to titrate as tolerated for further rate control and to assist with proper treatment of heart failure. -will hold on diuretics given worsening on renal function. -Follow daily weights, low-sodium diet and strict I's and O's -Follow clinical response and recommendations by cardiology service. -unable to use ACE/ARB given worsening renal function and soft BP.  2-diarrhea/nausea/dizziness -Appears to be functional in nature; no signs or complaints suggesting infection component. -Will treat symptomatically with Lomotil. -Patient advised to maintain adequate hydration. -Continue as needed antiemetics and meclizine.  3-history of atrial fibrillation -Rate controlled and stable -Continue Eliquis for secondary prevention -Previously was using verapamil; now on bisoprolol. -Follow cardiology recommendations. -Continue telemetry monitoring.  4-hypokalemia -Magnesium within normal limits -Continue to replete as needed -Follow electrolytes trend.  5-GERD -Continue PPI.  6-history of asthma -No wheezing currently -Continue monitoring. -Continue as needed bronchodilator nebulizer tx.  7-anemia of chronic kidney disease -Appears stable -No signs of overt bleeding; some hemodilution in the setting of fluid overload most likely playing a role. -Follow hemoglobin trend.  8-acute kidney injury on chronic kidney disease stage IIIb -Appears to be secondary to diuresis -Diuretics have been placed on hold -Continue to follow urine output -Advised to maintain adequate oral hydration. -Continue to closely monitor renal function trend.  9-history of breast cancer -Status post bilateral mastectomy -Continue surveillance as an outpatient  10-history of anxiety -stable mood -Continue as needed Xanax.   DVT prophylaxis: Chronically on Eliquis. Code Status: Full code Family Communication: No family at bedside.  Disposition:  Status is:  Inpatient   Dispo: The patient is from: Home  Anticipated d/c is to: Hopefully discharge back home              Anticipated d/c date is: 6/29              Patient currently not medically stable for discharge secondary to complaints of feeling dizzy and nauseated.  Breathing has improved; but in the process renal function has decompensated further.  Stop diuresis.  Follow renal function trend and electrolytes.  As needed meclizine.  Continue as needed antiemetics.  Continue to follow clinical response and cardiology recommendations..     Consultants:   Cardiology service   Procedures:  See below for x-ray report 2D echo: Demonstrating global hypokinesis, ejection fraction 35 to 40%.  Mildly reduced right ventricular contraction, moderate mitral and tricuspid regurgitation and a relatively large posterior pericardial effusion with a smaller collection anteriorly and no clear evidence of tamponade.   Antimicrobials:  None   Subjective: Reports improvement in her breathing, feeling dizzy when changing positions, good urine output, no chest pain or palpitations.  Patient is afebrile.  Positive nausea  Objective: Vitals:   09/20/19 2132 09/21/19 0408 09/21/19 0535 09/21/19 0846  BP: (!) 115/53  130/79   Pulse: 76  75   Resp: 16  18   Temp: 98.3 F (36.8 C)  98 F (36.7 C)   TempSrc: Oral  Oral   SpO2: 99%  100% 95%  Weight:  66.3 kg    Height:        Intake/Output Summary (Last 24 hours) at 09/21/2019 1436 Last data filed at 09/21/2019 1100 Gross per 24 hour  Intake 720 ml  Output 500 ml  Net 220 ml   Filed Weights   09/19/19 0250 09/20/19 0316 09/21/19 0408  Weight: 67.7 kg 66.7 kg 66.3 kg    Examination: General exam: Alert, awake, oriented x 3, ports breathing is much better and no longer noticing swelling in her legs.  She denies orthopnea today.  Expressed feeling nauseated and dizzy when changing positions.  Good urine output reported. Respiratory  system: No frank crackles, no wheezing, no using accessory muscles.  Currently off oxygen supplementation at rest. Cardiovascular system: Positive murmur, no JVD, no rubs or gallops.  Rate controlled. Gastrointestinal system: Abdomen is nondistended, soft and nontender. No organomegaly or masses felt. Normal bowel sounds heard. Central nervous system: Alert and oriented. No focal neurological deficits. Extremities: No cyanosis or clubbing; no edema appreciated currently. Skin: No rashes, no petechiae. Psychiatry: Judgement and insight appear normal. Mood & affect appropriate.    Data Reviewed: I have personally reviewed following labs and imaging studies  CBC: Recent Labs  Lab 09/18/19 1117  WBC 8.6  NEUTROABS 6.6  HGB 8.3*  HCT 27.0*  MCV 86.8  PLT 812    Basic Metabolic Panel: Recent Labs  Lab 09/18/19 1116 09/18/19 1624 09/19/19 0451 09/20/19 0448 09/21/19 0509  NA 133*  --  135 134* 133*  K 2.9*  --  3.8 3.8 3.8  CL 94*  --  97* 95* 94*  CO2 28  --  26 27 27   GLUCOSE 97  --  78 83 103*  BUN 11  --  12 14 17   CREATININE 1.50*  --  1.49* 1.61* 1.94*  CALCIUM 8.4*  --  8.7* 8.9 8.8*  MG 2.0 2.2  --   --   --     GFR: Estimated Creatinine Clearance: 21.7 mL/min (A) (by C-G formula based on SCr of 1.94 mg/dL (H)).  Recent Results (from the past 240 hour(s))  SARS Coronavirus 2 by RT PCR (hospital order, performed in [facility] hospital lab) Nasopharyngeal Nasopharyngeal Swab     Status: None   Collection Time: 09/18/19  1:27 PM   Specimen: Nasopharyngeal Swab  Result Value Ref Range Status   SARS Coronavirus 2 NEGATIVE NEGATIVE Final    Comment: (NOTE) SARS-CoV-2 target nucleic acids are NOT DETECTED.  The SARS-CoV-2 RNA is generally detectable in upper and lower respiratory specimens during the acute phase of infection. The lowest concentration of SARS-CoV-2 viral copies this assay can detect is 250 copies / mL. A negative result does not preclude  SARS-CoV-2 infection and should not be used as the sole basis for treatment or other patient management decisions.  A negative result may occur with improper specimen collection / handling, submission of specimen other than nasopharyngeal swab, presence of viral mutation(s) within the areas targeted by this assay, and inadequate number of viral copies (<250 copies / mL). A negative result must be combined with clinical observations, patient history, and epidemiological information.  Fact Sheet for Patients:   StrictlyIdeas.no  Fact Sheet for Healthcare Providers: BankingDealers.co.za  This test is not yet approved or  cleared by the [place] FDA and has been authorized for detection and/or diagnosis of SARS-CoV-2 by FDA under an Emergency Use Authorization (EUA).  This EUA will remain in effect (meaning this test can be used) for the duration of the COVID-19 declaration under Section 564(b)(1) of the Act, 21 U.S.C. section 360bbb-3(b)(1), unless the authorization is terminated or revoked sooner.  Performed at [facility], [address] [place], [place] [address]      Radiology Studies: No results found.  Scheduled Meds: . apixaban  2.5 mg Oral BID  . bisoprolol  5 mg Oral Daily  . calcium-vitamin D  1 tablet Oral BID  . pantoprazole  40 mg Oral Daily  . potassium chloride  20 mEq Oral Daily  . sodium chloride flush  3 mL Intravenous Q12H   Continuous Infusions: . sodium chloride       LOS: 3 days    Time spent: 30 minutes   Barton Dubois, MD [facility]   To contact the attending provider between 7A-7P or the covering provider during after hours 7P-7A, please log into the web site [email or web address] and access using universal [place] password for that web site. If you do not have the password, please call the hospital operator.  09/21/2019, 2:36 PM

## 2019-09-22 LAB — BASIC METABOLIC PANEL
Anion gap: 12 (ref 5–15)
BUN: 20 mg/dL (ref 8–23)
CO2: 25 mmol/L (ref 22–32)
Calcium: 8.8 mg/dL — ABNORMAL LOW (ref 8.9–10.3)
Chloride: 94 mmol/L — ABNORMAL LOW (ref 98–111)
Creatinine, Ser: 1.97 mg/dL — ABNORMAL HIGH (ref 0.44–1.00)
GFR calc Af Amer: 27 mL/min — ABNORMAL LOW (ref >60)
GFR calc non Af Amer: 23 mL/min — ABNORMAL LOW (ref >60)
Glucose, Bld: 112 mg/dL — ABNORMAL HIGH (ref 70–99)
Potassium: 3.8 mmol/L (ref 3.5–5.1)
Sodium: 131 mmol/L — ABNORMAL LOW (ref 135–145)

## 2019-09-22 MED ORDER — MECLIZINE HCL 12.5 MG PO TABS: 12.5000 mg | ORAL_TABLET | Freq: Three times a day (TID) | ORAL | Status: DC | PRN

## 2019-09-22 MED ORDER — SODIUM CHLORIDE 0.9 % IV SOLN: INTRAVENOUS | Status: AC

## 2019-09-22 NOTE — Progress Notes (Addendum)
Progress Note  Patient Name: Bailey Bishop Date of Encounter: 09/22/2019  [facility] Cardiologist: Kate Sable, MD   Subjective   No SOB, but ongoing dizziness and fatigue  Inpatient Medications    Scheduled Meds: . apixaban  2.5 mg Oral BID  . bisoprolol  5 mg Oral Daily  . calcium-vitamin D  1 tablet Oral BID  . pantoprazole  40 mg Oral Daily  . potassium chloride  20 mEq Oral Daily  . sodium chloride flush  3 mL Intravenous Q12H   Continuous Infusions: . sodium chloride     PRN Meds: sodium chloride, acetaminophen, ALPRAZolam, diphenoxylate-atropine, fluticasone, ondansetron (ZOFRAN) IV, prochlorperazine, sodium chloride flush   Vital Signs    Vitals:   09/21/19 2129 09/22/19 0500 09/22/19 0520 09/22/19 0757  BP: 108/70  122/83   Pulse: 65  90   Resp: 16  20   Temp: (!) 97.4 F (36.3 C)  97.8 F (36.6 C)   TempSrc: Oral  Oral   SpO2: 95%  97% 94%  Weight:  66.2 kg    Height:        Intake/Output Summary (Last 24 hours) at 09/22/2019 0831 Last data filed at 09/22/2019 0300 Gross per 24 hour  Intake 960 ml  Output 200 ml  Net 760 ml   Last 3 Weights 09/22/2019 09/21/2019 09/20/2019  Weight (lbs) 145 lb 15.1 oz 146 lb 2.6 oz 147 lb 0.8 oz  Weight (kg) 66.2 kg 66.3 kg 66.7 kg      Telemetry    Rate controlled afib - Personally Reviewed  ECG    n/a - Personally Reviewed  Physical Exam   GEN: No acute distress.   Neck: No JVD Cardiac: irreg, no murmurs, rubs, or gallops.  Respiratory: Clear to auscultation bilaterally. GI: Soft, nontender, non-distended  MS: No edema; No deformity. Neuro:  Nonfocal  Psych: Normal affect   Labs    High Sensitivity Troponin:   Recent Labs  Lab 09/03/19 0955 09/03/19 1206 09/10/19 1702  TROPONINIHS 15 13 12       Chemistry Recent Labs  Lab 09/20/19 0448 09/21/19 0509 09/22/19 0455  NA 134* 133* 131*  K 3.8 3.8 3.8  CL 95* 94* 94*  CO2 27 27 25   GLUCOSE 83 103* 112*  BUN 14 17 20     CREATININE 1.61* 1.94* 1.97*  CALCIUM 8.9 8.8* 8.8*  GFRNONAA 30* 24* 23*  GFRAA 35* 28* 27*  ANIONGAP 12 12 12      Hematology Recent Labs  Lab 09/18/19 1117  WBC 8.6  RBC 3.11*  HGB 8.3*  HCT 27.0*  MCV 86.8  MCH 26.7  MCHC 30.7  RDW 15.6*  PLT 297    BNP Recent Labs  Lab 09/18/19 1116  BNP 822.0*     DDimer No results for input(s): DDIMER in the last 168 hours.   Radiology    No results found.  Cardiac Studies     Patient Profile     79 y.o. female  with a history of permanent atrial fibrillation, hypertension, chronic diastolic heart failure, and CKD stage IIIbwho is being seen today for the evaluation of worsening shortness of breath and leg swellingat the request of [name].    Assessment & Plan    1. Acute systolic HF - echo showed LVEF 35-40%, mild RV dysfunction, mod MR I/Os are incomplete this admission, weights without consistent trend and of unclear accuracy.  - diuretic held yesterday due to significant AKI though she did  get eary AM dose. Mild further uptrend in Cr.  - Renal function would prohibit considerations for ischemic testing at this time.   - Medical therapy with bisoprolol 5mg  (reported side effects on coreg and metoprolol previously), no ACE/ARB/ARNI/Aldactone due to poor renal function and soft bp's at times. If renal funciton returns to baseline could start low dose aldactone. Depending bp's over time could consider hydral/nitrates.   - with worsneing Cr and dizziness will give back gentle IVFs today and monitor symptoms  2. Afib History of persistent afib, failed tikosyn and intolerant to amio. Multiple drug intolerances. Verapamil stopped in setting of systolic dysfunction,started on bisoprolol 5mg  daily (note prior side effects metoprolol, coreg).    3. Pericardial effusion Large pericardial effusion with prior history of small effusion on echo. Would repeat limited echo over next few weeks  4. Dizziness -  nonspecific symptoms yesterday. With AKI perhaps due to hypolemia, though orthostaics were negative yesterday - some descriptions favored vertigo, we did try meclzine yesterday she denied any benefit  - will give back some IVFs today and see how she feels.  - would not rush and attribute symptoms to beta blocker as of yet, though she has had prior documented nonspecific symptoms to beta blockers. Given signifcant cardiac benefits would need to exclude any other causes of her symptoms.    For questions or updates, please contact [place] Please consult [email or web address] for contact info under        Signed, Carlyle Dolly, MD  09/22/2019, 8:31 AM

## 2019-09-22 NOTE — Progress Notes (Signed)
PROGRESS NOTE    Bailey Bishop  HQP:[id number] DOB: 1940-11-07 DOA: 09/18/2019 PCP: [name], MD   Chief Complaint  Patient presents with  . Shortness of Breath    Brief Narrative:  Bailey Bishop is a 79 y.o. female with medical history significant of A. Fib (on eliquis), diastolic HF, GERD, anxiety, HTN, CKD stage 3b, hx of right breast cancer (status post bilateral mastectomy), asthma (mild and intermittent), recent admission around 09/03/19--09/06/19 due to SOB and findings of anemia at that time (no transfusion needed and no active source of bleeding appreciated); patient was subsequently seen on 09/10/19 due to similar complaints at the ED and treated for bronchitis and discharge home. Patient presented to Ed today with complaints of worsening SOB, LE edema and orthopnea. Patient reported that over the last 3 weeks her symptoms has continue worsening and now she is SOB even at rest and noticing some orthopnea (needing 1 pillow at time to sleep); patient also expressed some swelling on her legs and on arrival to ED O2 sat was 87% on RA (improved to mid 90's with 2L). Patient reported increased fatigue and deconditioned; she denies fever, chills, CP, nausea, vomiting, dysuria, hematuria, focal weakness, melena, hematochezia or any other complaints.  Patient reported to be compliant with her meds and also with low sodium diet, denies palpitations.  ED Course: BNP elevated, CXR with vascular congestion and finfigs of early pulmonary edema, Hgb stable and renal function WNL for her. Positive hypokalemia. Cardiology service consulted and recommended admission for CHF exacerbation. Potassium repletion started in ED and 2-D echo ordered.   Assessment & Plan: 1-acute on chronic combined systolic heart failure -Repeat echo has demonstrated decrease in ejection fraction and severe global hypokinesis -Verapamil has been discontinued in the setting of decreased ejection fraction, patient  started on bisoprolol 5 mg daily with intention to titrate as tolerated for further rate control and to assist with proper treatment of heart failure. -will hold on diuretics given worsening on renal function. -Follow daily weights, low-sodium diet and strict I's and O's -Follow clinical response and recommendations by cardiology service. -unable to use ACE/ARB given worsening renal function and soft BP.  2-diarrhea/nausea/dizziness -Appears to be functional in nature; no signs or complaints suggesting infection component. -Will continue to treat symptomatically with Lomotil. -Patient advised to maintain adequate hydration. -Continue as needed antiemetics and meclizine.  3-history of atrial fibrillation -Rate controlled and stable -Continue Eliquis for secondary prevention -Previously was using verapamil; now on bisoprolol. -Follow cardiology recommendations. -Continue telemetry monitoring.  4-hypokalemia -Magnesium within normal limits -Continue to replete as needed -Follow electrolytes trend.  5-GERD -Continue PPI.  6-history of asthma -No wheezing currently -Continue monitoring. -Continue as needed bronchodilator nebulizer tx.  7-anemia of chronic kidney disease -Appears stable -No signs of overt bleeding; some hemodilution in the setting of fluid overload most likely playing a role. -Follow hemoglobin trend.  8-acute kidney injury on chronic kidney disease stage IIIb -Appears to be secondary to diuresis -Diuretics have been placed on hold -Continue to follow urine output -Advised to maintain adequate oral hydration. -Continue to closely monitor renal function trend. -Gentle hydration will be provided following recommendations by cardiology service -No orthostatic changes appreciated.  9-history of breast cancer -Status post bilateral mastectomy -Continue surveillance as an outpatient  10-history of anxiety -stable mood -Continue as needed Xanax.   DVT  prophylaxis: Chronically on Eliquis. Code Status: Full code Family Communication: No family at bedside.  Disposition:  Status is: Inpatient  Dispo: The patient is from: Home              Anticipated d/c is to: Hopefully discharge back home              Anticipated d/c date is: 6/29              Patient currently not medically stable for discharge secondary to complaints of feeling dizzy and still demonstrating abnormal renal function.  Breathing has improved; but in the process renal function has decompensated further.  Continue holding diuretics. Follow renal function trend and electrolytes.  Continue as needed meclizine.  Continue as needed antiemetics.  Continue to follow clinical response and cardiology recommendations.     Consultants:   Cardiology service   Procedures:  See below for x-ray report 2D echo: Demonstrating global hypokinesis, ejection fraction 35 to 40%.  Mildly reduced right ventricular contraction, moderate mitral and tricuspid regurgitation and a relatively large posterior pericardial effusion with a smaller collection anteriorly and no clear evidence of tamponade.   Antimicrobials:  None   Subjective: No fever, no chest pain, no shortness of breath, no nausea, no vomiting.  Continue complaining of fatigue and dizziness.  Reports good urine output.  Objective: Vitals:   09/21/19 2129 09/22/19 0500 09/22/19 0520 09/22/19 0757  BP: 108/70  122/83   Pulse: 65  90   Resp: 16  20   Temp: (!) 97.4 F (36.3 C)  97.8 F (36.6 C)   TempSrc: Oral  Oral   SpO2: 95%  97% 94%  Weight:  66.2 kg    Height:        Intake/Output Summary (Last 24 hours) at 09/22/2019 1353 Last data filed at 09/22/2019 0300 Gross per 24 hour  Intake 480 ml  Output --  Net 480 ml   Filed Weights   09/20/19 0316 09/21/19 0408 09/22/19 0500  Weight: 66.7 kg 66.3 kg 66.2 kg    Examination: General exam: Alert, awake, oriented x 3, no requiring oxygen supplementation reported  no shortness of breath, chest pain or palpitations.  Patient continue reporting ongoing dizziness and fatigue. Respiratory system: No wheezing, no crackles, no using accessory muscles.  Good air movement bilaterally. Cardiovascular system: Rate controlled, no rubs, no gallops, no JVD; positive murmur on exam. Gastrointestinal system: Abdomen is nondistended, soft and nontender. No organomegaly or masses felt. Normal bowel sounds heard. Central nervous system: Alert and oriented. No focal neurological deficits. Extremities: No cyanosis or clubbing.  No edema Skin: No rashes, no petechiae. Psychiatry: Judgement and insight appear normal. Mood & affect appropriate.    Data Reviewed: I have personally reviewed following labs and imaging studies  CBC: Recent Labs  Lab 09/18/19 1117  WBC 8.6  NEUTROABS 6.6  HGB 8.3*  HCT 27.0*  MCV 86.8  PLT 188    Basic Metabolic Panel: Recent Labs  Lab 09/18/19 1116 09/18/19 1624 09/19/19 0451 09/20/19 0448 09/21/19 0509 09/22/19 0455  NA 133*  --  135 134* 133* 131*  K 2.9*  --  3.8 3.8 3.8 3.8  CL 94*  --  97* 95* 94* 94*  CO2 28  --  26 27 27 25   GLUCOSE 97  --  78 83 103* 112*  BUN 11  --  12 14 17 20   CREATININE 1.50*  --  1.49* 1.61* 1.94* 1.97*  CALCIUM 8.4*  --  8.7* 8.9 8.8* 8.8*  MG 2.0 2.2  --   --   --   --  GFR: Estimated Creatinine Clearance: 21.3 mL/min (A) (by C-G formula based on SCr of 1.97 mg/dL (H)).   Recent Results (from the past 240 hour(s))  SARS Coronavirus 2 by RT PCR (hospital order, performed in [facility] hospital lab) Nasopharyngeal Nasopharyngeal Swab     Status: None   Collection Time: 09/18/19  1:27 PM   Specimen: Nasopharyngeal Swab  Result Value Ref Range Status   SARS Coronavirus 2 NEGATIVE NEGATIVE Final    Comment: (NOTE) SARS-CoV-2 target nucleic acids are NOT DETECTED.  The SARS-CoV-2 RNA is generally detectable in upper and lower respiratory specimens during the acute phase of  infection. The lowest concentration of SARS-CoV-2 viral copies this assay can detect is 250 copies / mL. A negative result does not preclude SARS-CoV-2 infection and should not be used as the sole basis for treatment or other patient management decisions.  A negative result may occur with improper specimen collection / handling, submission of specimen other than nasopharyngeal swab, presence of viral mutation(s) within the areas targeted by this assay, and inadequate number of viral copies (<250 copies / mL). A negative result must be combined with clinical observations, patient history, and epidemiological information.  Fact Sheet for Patients:   StrictlyIdeas.no  Fact Sheet for Healthcare Providers: BankingDealers.co.za  This test is not yet approved or  cleared by the [place] FDA and has been authorized for detection and/or diagnosis of SARS-CoV-2 by FDA under an Emergency Use Authorization (EUA).  This EUA will remain in effect (meaning this test can be used) for the duration of the COVID-19 declaration under Section 564(b)(1) of the Act, 21 U.S.C. section 360bbb-3(b)(1), unless the authorization is terminated or revoked sooner.  Performed at [facility], [address] [place], [place] [address]      Radiology Studies: No results found.  Scheduled Meds: . apixaban  2.5 mg Oral BID  . bisoprolol  5 mg Oral Daily  . calcium-vitamin D  1 tablet Oral BID  . pantoprazole  40 mg Oral Daily  . potassium chloride  20 mEq Oral Daily  . sodium chloride flush  3 mL Intravenous Q12H   Continuous Infusions: . sodium chloride    . sodium chloride 75 mL/hr at 09/22/19 1104     LOS: 4 days    Time spent: 30 minutes   Barton Dubois, MD [facility]   To contact the attending provider between 7A-7P or the covering provider during after hours 7P-7A, please log into the web site [email or web address] and access using universal  [place] password for that web site. If you do not have the password, please call the hospital operator.  09/22/2019, 1:53 PM

## 2019-09-23 ENCOUNTER — Inpatient Hospital Stay (HOSPITAL_COMMUNITY): Payer: Medicare Other

## 2019-09-23 LAB — BASIC METABOLIC PANEL
Anion gap: 13 (ref 5–15)
BUN: 25 mg/dL — ABNORMAL HIGH (ref 8–23)
CO2: 24 mmol/L (ref 22–32)
Calcium: 8.9 mg/dL (ref 8.9–10.3)
Chloride: 92 mmol/L — ABNORMAL LOW (ref 98–111)
Creatinine, Ser: 2.2 mg/dL — ABNORMAL HIGH (ref 0.44–1.00)
GFR calc Af Amer: 24 mL/min — ABNORMAL LOW (ref >60)
GFR calc non Af Amer: 20 mL/min — ABNORMAL LOW (ref >60)
Glucose, Bld: 106 mg/dL — ABNORMAL HIGH (ref 70–99)
Potassium: 4.1 mmol/L (ref 3.5–5.1)
Sodium: 129 mmol/L — ABNORMAL LOW (ref 135–145)

## 2019-09-23 MED ORDER — ALPRAZOLAM 0.5 MG PO TABS
0.5000 mg | ORAL_TABLET | Freq: Once | ORAL | Status: AC
  Administered 2019-09-23: 0.5 mg via ORAL
  Filled 2019-09-23: qty 1

## 2019-09-23 MED ORDER — SODIUM CHLORIDE 0.9 % IV SOLN: INTRAVENOUS | Status: AC

## 2019-09-23 NOTE — Progress Notes (Signed)
Progress Note  Patient Name: Bailey Bishop Date of Encounter: 09/23/2019  [facility] Cardiologist: Kate Sable, MD Will establish with Dr Domenic Polite  Subjective   No complaints  Inpatient Medications    Scheduled Meds: . apixaban  2.5 mg Oral BID  . bisoprolol  5 mg Oral Daily  . calcium-vitamin D  1 tablet Oral BID  . pantoprazole  40 mg Oral Daily  . potassium chloride  20 mEq Oral Daily  . sodium chloride flush  3 mL Intravenous Q12H   Continuous Infusions: . sodium chloride     PRN Meds: sodium chloride, acetaminophen, ALPRAZolam, diphenoxylate-atropine, fluticasone, meclizine, ondansetron (ZOFRAN) IV, prochlorperazine, sodium chloride flush   Vital Signs    Vitals:   09/22/19 2053 09/23/19 0607 09/23/19 0607 09/23/19 0842  BP: 115/68 121/66    Pulse: 87 (!) 123 82   Resp:      Temp: 98.9 F (37.2 C) 97.8 F (36.6 C)    TempSrc: Oral Oral    SpO2: 95% 96% 96% 96%  Weight:  67.3 kg    Height:        Intake/Output Summary (Last 24 hours) at 09/23/2019 0900 Last data filed at 09/22/2019 2055 Gross per 24 hour  Intake 308.41 ml  Output 300 ml  Net 8.41 ml   Last 3 Weights 09/23/2019 09/22/2019 09/21/2019  Weight (lbs) 148 lb 6.4 oz 145 lb 15.1 oz 146 lb 2.6 oz  Weight (kg) 67.314 kg 66.2 kg 66.3 kg      Telemetry    Rate controlled afib- Personally Reviewed  ECG    n/a - Personally Reviewed  Physical Exam   GEN: No acute distress.   Neck: No JVD Cardiac: irreg, no murmurs, rubs, or gallops.  Respiratory: Clear to auscultation bilaterally. GI: Soft, nontender, non-distended  MS: No edema; No deformity. Neuro:  Nonfocal  Psych: Normal affect   Labs    High Sensitivity Troponin:   Recent Labs  Lab 09/03/19 0955 09/03/19 1206 09/10/19 1702  TROPONINIHS 15 13 12       Chemistry Recent Labs  Lab 09/21/19 0509 09/22/19 0455 09/23/19 0514  NA 133* 131* 129*  K 3.8 3.8 4.1  CL 94* 94* 92*  CO2 27 25 24   GLUCOSE 103* 112*  106*  BUN 17 20 25*  CREATININE 1.94* 1.97* 2.20*  CALCIUM 8.8* 8.8* 8.9  GFRNONAA 24* 23* 20*  GFRAA 28* 27* 24*  ANIONGAP 12 12 13      Hematology Recent Labs  Lab 09/18/19 1117  WBC 8.6  RBC 3.11*  HGB 8.3*  HCT 27.0*  MCV 86.8  MCH 26.7  MCHC 30.7  RDW 15.6*  PLT 297    BNP Recent Labs  Lab 09/18/19 1116  BNP 822.0*     DDimer No results for input(s): DDIMER in the last 168 hours.   Radiology    No results found.  Cardiac Studies     Patient Profile     79 y.o.femalewith a history of permanent atrial fibrillation, hypertension, chronic diastolic heart failure, and CKD stage IIIbwho is being seen today for the evaluation of worsening shortness of breath and leg swellingat the request of [name].  Assessment & Plan      1. Acute systolic HF - new diagnosis this admission, 08/2019 echo showed LVEF 35-40%, mild RV dysfunction, mod MR I/Os are incomplete this admission, weights without consistent trend and of unclear accuracy.  - diuretic held due to AKI. Due to persistent renal dysfunction  and some ongoing dizziness gave back some IVFs yesterday. There was issues with IV access, I cannot tell from [facility] if she got total amount of fluids.   - Renal function would prohibit considerations for ischemic testing at this time.   - Medical therapy with bisoprolol 5mg  (reported side effects on coreg and metoprolol previously), no ACE/ARB/ARNI/Aldactone due to poor renal function and soft bp's at times. If renal funciton returns to baseline could start low dose aldactone. Depending bp's over time could consider hydral/nitrates over time however with her extensive drug allergy list would be cautious.   - overall her SOb has improved with diuresis, no long has oxygen requirement and she and her husband tell me today her breathing is doing well.    2. Afib History of persistent afib, failed tikosyn and intolerant to amio. Multiple drug intolerances.  Verapamil stopped in setting of systolic dysfunction,started on bisoprolol 5mg  daily (note prior side effects metoprolol, coreg).  - was on amio that was started 04/2019. Subsequentlty stopped as she reported some SOB.  - seems to be tolerating bisprolol - family has ongiong concerns about amiodarone possible toxicities, especailly if related to her SOB. Explained that this admission she was in clear heart failure and was the likely cause of her SOB as it has improved with diuresis. If recurrent issues with SOB and ongoing family concerns at follow up can consider PFTs.   3. Pericardial effusion Large pericardial effusion with prior history of small effusion on echo. Would repeat limited echo over next few weeks  4. AKI - likely overdiuresis, will give gentle IVFs  5. Hyponatremia - likely related to diuretic, gentle IVFs today  For questions or updates, please contact [place] Please consult [email or web address] for contact info under        Signed, Carlyle Dolly, MD  09/23/2019, 9:00 AM

## 2019-09-23 NOTE — Progress Notes (Signed)
PROGRESS NOTE    [name]  ZOX:[id number] DOB: 1940-08-05 DOA: 09/18/2019 PCP: [name], MD   Chief Complaint  Patient presents with  . Shortness of Breath    Brief Narrative:  Bailey Bishop is a 79 y.o. female with medical history significant of A. Fib (on eliquis), diastolic HF, GERD, anxiety, HTN, CKD stage 3b, hx of right breast cancer (status post bilateral mastectomy), asthma (mild and intermittent), recent admission around 09/03/19--09/06/19 due to SOB and findings of anemia at that time (no transfusion needed and no active source of bleeding appreciated); patient was subsequently seen on 09/10/19 due to similar complaints at the ED and treated for bronchitis and discharge home. Patient presented to Ed today with complaints of worsening SOB, LE edema and orthopnea. Patient reported that over the last 3 weeks her symptoms has continue worsening and now she is SOB even at rest and noticing some orthopnea (needing 1 pillow at time to sleep); patient also expressed some swelling on her legs and on arrival to ED O2 sat was 87% on RA (improved to mid 90's with 2L). Patient reported increased fatigue and deconditioned; she denies fever, chills, CP, nausea, vomiting, dysuria, hematuria, focal weakness, melena, hematochezia or any other complaints.  Patient reported to be compliant with her meds and also with low sodium diet, denies palpitations.  ED Course: BNP elevated, CXR with vascular congestion and finfigs of early pulmonary edema, Hgb stable and renal function WNL for her. Positive hypokalemia. Cardiology service consulted and recommended admission for CHF exacerbation. Potassium repletion started in ED and 2-D echo ordered.   Assessment & Plan: 1-HFrEF---acute on chronic combined systolic and diastolic heart failure -Repeat echo has demonstrated decrease in ejection fraction and severe global hypokinesis -Verapamil has been discontinued in the setting of decreased  ejection fraction, patient started on bisoprolol 5 mg daily with intention to titrate as tolerated for further rate control and to assist with proper treatment of heart failure. -will hold on diuretics given worsening on renal function. -Follow daily weights, low-sodium diet and strict I's and O's -Follow clinical response and recommendations by cardiology service. -unable to use ACE/ARB given worsening renal function and soft BP.  2-diarrhea/nausea/dizziness -Appears to be functional in nature;   -Will continue to treat symptomatically with Lomotil -Patient advised to maintain adequate hydration. -Continue as needed antiemetics and meclizine.  3-history of atrial fibrillation -Rate controlled and stable -Continue Eliquis for secondary prevention -Previously was using verapamil; switched over to bisoprolol due to low EF of 35 to 40%  4-hypokalemia/hyponatremia -Magnesium within normal limits -Sodium is down to 129 from 134 suspect due to diuresis,  --hydrate gently -Continue to replete as needed -Follow electrolytes trend.  5-GERD -Continue PPI.  6-history of asthma -No wheezing currently -Continue monitoring. -Continue as needed bronchodilator nebulizer tx.  7-anemia of chronic kidney disease -Appears stable -Hemoglobin may drop follow with hydration -Follow hemoglobin trend.  8-acute kidney injury on chronic kidney disease stage IIIb -Appears to be secondary to diuresis -Diuretics have been placed on hold -Continue to follow urine output -Advised to maintain adequate oral hydration. -Continue to closely monitor renal function trend. -Gentle hydration will be provided following recommendations by cardiology service -No orthostatic changes appreciated. AKI----acute kidney injury on CKD stage -  IIIb--worsening renal function appears to be due to diuresis -Diuretics are currently on hold -IV fluids started/gentle hydration - creatinine on admission=1.5  , baseline  creatinine = 1.4 to 1.6   , creatinine is now= 2.2 --  renally adjust medications, avoid nephrotoxic agents / dehydration  / hypotension  9-history of breast cancer -Status post bilateral mastectomy -Continue surveillance as an outpatient  10-history of anxiety -stable mood -Continue as needed Xanax.   DVT prophylaxis: Chronically on Eliquis. Code Status: Full code Family Communication: Husband at bedside Disposition:  Status is: Inpatient   Dispo: The patient is from: Home              Anticipated d/c is to: Hopefully discharge back home              Anticipated d/c date is: 09/24/19              Patient currently not medically stable for discharge due to worsening hyponatremia and progressively worsening renal function, start IV fluids,    continue holding diuretics. Follow renal function trend and electrolytes.   .     Consultants:   Cardiology service   Procedures:  See below for x-ray report 2D echo: Demonstrating global hypokinesis, ejection fraction 35 to 40%.  Mildly reduced right ventricular contraction, moderate mitral and tricuspid regurgitation and a relatively large posterior pericardial effusion with a smaller collection anteriorly and no clear evidence of tamponade.   Antimicrobials:  None   Subjective: Husband at bedside, patient reports no dizziness and shortness of breath is improved -Denies chest pains  Objective: Vitals:   09/22/19 2053 09/23/19 0607 09/23/19 0607 09/23/19 0842  BP: 115/68 121/66    Pulse: 87 (!) 123 82   Resp:      Temp: 98.9 F (37.2 C) 97.8 F (36.6 C)    TempSrc: Oral Oral    SpO2: 95% 96% 96% 96%  Weight:  67.3 kg    Height:        Intake/Output Summary (Last 24 hours) at 09/23/2019 1120 Last data filed at 09/22/2019 2055 Gross per 24 hour  Intake 308.41 ml  Output 300 ml  Net 8.41 ml   Filed Weights   09/21/19 0408 09/22/19 0500 09/23/19 0607  Weight: 66.3 kg 66.2 kg 67.3 kg    Examination: General exam:  Alert, awake, oriented x 3,   Respiratory system: Mostly clear, air movement is fair and symmetrical bilaterally Cardiovascular system: Rate controlled, no rubs, no gallops, no JVD; positive murmur on exam. Gastrointestinal system: Abdomen is nondistended, soft and nontender  Normal bowel sounds heard. Central nervous system: Alert and oriented. No focal neurological deficits. Extremities: No cyanosis or clubbing.  No edema Skin: No rashes, no petechiae. Psychiatry: Judgement and insight appear normal. Mood & affect appropriate.    Data Reviewed:  CBC: Recent Labs  Lab 09/18/19 1117  WBC 8.6  NEUTROABS 6.6  HGB 8.3*  HCT 27.0*  MCV 86.8  PLT 696    Basic Metabolic Panel: Recent Labs  Lab 09/18/19 1116 09/18/19 1116 09/18/19 1624 09/19/19 0451 09/20/19 0448 09/21/19 0509 09/22/19 0455 09/23/19 0514  NA 133*   < >  --  135 134* 133* 131* 129*  K 2.9*   < >  --  3.8 3.8 3.8 3.8 4.1  CL 94*   < >  --  97* 95* 94* 94* 92*  CO2 28   < >  --  26 27 27 25 24   GLUCOSE 97   < >  --  78 83 103* 112* 106*  BUN 11   < >  --  12 14 17 20  25*  CREATININE 1.50*   < >  --  1.49* 1.61* 1.94* 1.97* 2.20*  CALCIUM 8.4*   < >  --  8.7* 8.9 8.8* 8.8* 8.9  MG 2.0  --  2.2  --   --   --   --   --    < > = values in this interval not displayed.    GFR: Estimated Creatinine Clearance: 19.2 mL/min (A) (by C-G formula based on SCr of 2.2 mg/dL (H)).   Recent Results (from the past 240 hour(s))  SARS Coronavirus 2 by RT PCR (hospital order, performed in [facility] hospital lab) Nasopharyngeal Nasopharyngeal Swab     Status: None   Collection Time: 09/18/19  1:27 PM   Specimen: Nasopharyngeal Swab  Result Value Ref Range Status   SARS Coronavirus 2 NEGATIVE NEGATIVE Final    Comment: (NOTE) SARS-CoV-2 target nucleic acids are NOT DETECTED.  The SARS-CoV-2 RNA is generally detectable in upper and lower respiratory specimens during the acute phase of infection. The lowest concentration  of SARS-CoV-2 viral copies this assay can detect is 250 copies / mL. A negative result does not preclude SARS-CoV-2 infection and should not be used as the sole basis for treatment or other patient management decisions.  A negative result may occur with improper specimen collection / handling, submission of specimen other than nasopharyngeal swab, presence of viral mutation(s) within the areas targeted by this assay, and inadequate number of viral copies (<250 copies / mL). A negative result must be combined with clinical observations, patient history, and epidemiological information.  Fact Sheet for Patients:   StrictlyIdeas.no  Fact Sheet for Healthcare Providers: BankingDealers.co.za  This test is not yet approved or  cleared by the [place] FDA and has been authorized for detection and/or diagnosis of SARS-CoV-2 by FDA under an Emergency Use Authorization (EUA).  This EUA will remain in effect (meaning this test can be used) for the duration of the COVID-19 declaration under Section 564(b)(1) of the Act, 21 U.S.C. section 360bbb-3(b)(1), unless the authorization is terminated or revoked sooner.  Performed at [name], [address] [place], [place] [address]      Radiology Studies: No results found.  Scheduled Meds: . apixaban  2.5 mg Oral BID  . bisoprolol  5 mg Oral Daily  . calcium-vitamin D  1 tablet Oral BID  . pantoprazole  40 mg Oral Daily  . potassium chloride  20 mEq Oral Daily  . sodium chloride flush  3 mL Intravenous Q12H   Continuous Infusions: . sodium chloride    . sodium chloride 75 mL/hr at 09/23/19 1108     LOS: 5 days   Roxan Hockey, MD [facility]   09/23/2019, 11:20 AM

## 2019-09-23 NOTE — Care Management Important Message (Signed)
Important Message  Patient Details  Name: Bailey Bishop MRN: [id number] Date of Birth: 1941-03-25   Medicare Important Message Given:  Yes     Tommy Medal 09/23/2019, 3:16 PM

## 2019-09-23 NOTE — TOC Initial Note (Signed)
Transition of Care [facility]) - Initial/Assessment Note    Patient Details  Name: Bailey Bishop MRN: [id number] Date of Birth: 11/26/1940  [facility] CM/SW Contact:    Natasha Bence, LCSW Phone Number: 09/23/2019, 11:41 AM  Clinical Narrative:                 Patient is an 79 year old female admitted for acute on chronic diastolic heart failure. CSW spoke with patient's husband who reported that they have not previously utilzed [name] or SNF. Patient's husband reported that him and his son are able to assist the patient in maintaining the patient's ADL's and doctor's appointment. Patients husband agreeable to SNF or HH recommendation if referred. TOC to follow.    Expected Discharge Plan: Home/Self Care Barriers to Discharge: Continued Medical Work up   Patient Goals and CMS Choice     Choice offered to / list presented to : Patient  Expected Discharge Plan and Services Expected Discharge Plan: Home/Self Care       Living arrangements for the past 2 months: Single Family Home    Prior Living Arrangements/Services Living arrangements for the past 2 months: Single Family Home Lives with:: Spouse Patient language and need for interpreter reviewed:: Yes Do you feel safe going back to the place where you live?: Yes      Need for Family Participation in Patient Care: Yes (Comment) Care giver support system in place?: Yes (comment) Current home services: DME Criminal Activity/Legal Involvement Pertinent to Current Situation/Hospitalization: No - Comment as needed  Activities of Daily Living Home Assistive Devices/Equipment: None ADL Screening (condition at time of admission) Patient's cognitive ability adequate to safely complete daily activities?: Yes Is the patient deaf or have difficulty hearing?: Yes Does the patient have difficulty seeing, even when wearing glasses/contacts?: Yes Does the patient have difficulty concentrating, remembering, or making decisions?:  No Patient able to express need for assistance with ADLs?: Yes Does the patient have difficulty dressing or bathing?: No Independently performs ADLs?: Yes (appropriate for developmental age) Does the patient have difficulty walking or climbing stairs?: No Weakness of Legs: None Weakness of Arms/Hands: None  Permission Sought/Granted Permission sought to share information with : Chartered certified accountant granted to share information with : Yes, Verbal Permission Granted  Share Information with NAME: Malan Werk     Permission granted to share info w Relationship: Husband     Emotional Assessment       Orientation: : Oriented to Self, Oriented to Place, Oriented to  Time, Oriented to Situation Alcohol / Substance Use: Not Applicable Psych Involvement: No (comment)  Admission diagnosis:  Hypokalemia [E87.6] Acute on chronic diastolic HF (heart failure) (HCC) [I50.33] Acute on chronic diastolic congestive heart failure ([place]) [I50.33] Patient Active Problem List   Diagnosis Date Noted  . Acute on chronic diastolic HF (heart failure) ([place]) 09/18/2019  . GI bleed 09/05/2019  . Symptomatic anemia 09/03/2019  . CKD (chronic kidney disease), stage IIIb 09/03/2019  . Iron deficiency anemia   . Benign neoplasm of transverse colon   . Diverticulosis of large intestine without diverticulitis   . Visit for monitoring Tikosyn therapy 12/02/2017  . Atrial fibrillation ([place]) 03/31/2017  . Persistent atrial fibrillation ([place])   . Anticoagulated 10/03/2016  . COPD (chronic obstructive pulmonary disease) ([place]) 10/03/2016  . Hypokalemia 09/24/2016  . Asthma in adult without complication 76/16/0737  . Hyperglycemia 09/23/2016  . Anxiety   . GERD (gastroesophageal reflux disease)   . Shortness  of breath   . Essential hypertension   . Status asthmaticus 07/07/2014   PCP:  [name], MD Pharmacy:   [place], [place]  [address] Phone: [phone] Fax: [phone]  [place], [place] [address] Phone: [phone] Fax: [phone]    Readmission Risk Interventions No flowsheet data found.

## 2019-09-24 ENCOUNTER — Other Ambulatory Visit: Payer: Self-pay | Admitting: Student

## 2019-09-24 DIAGNOSIS — N183 Chronic kidney disease, stage 3 unspecified: Secondary | ICD-10-CM

## 2019-09-24 LAB — BASIC METABOLIC PANEL
Anion gap: 12 (ref 5–15)
BUN: 29 mg/dL — ABNORMAL HIGH (ref 8–23)
CO2: 24 mmol/L (ref 22–32)
Calcium: 8.9 mg/dL (ref 8.9–10.3)
Chloride: 96 mmol/L — ABNORMAL LOW (ref 98–111)
Creatinine, Ser: 2.35 mg/dL — ABNORMAL HIGH (ref 0.44–1.00)
GFR calc Af Amer: 22 mL/min — ABNORMAL LOW (ref >60)
GFR calc non Af Amer: 19 mL/min — ABNORMAL LOW (ref >60)
Glucose, Bld: 87 mg/dL (ref 70–99)
Potassium: 4.5 mmol/L (ref 3.5–5.1)
Sodium: 132 mmol/L — ABNORMAL LOW (ref 135–145)

## 2019-09-24 MED ORDER — APIXABAN 2.5 MG PO TABS: 2.5000 mg | ORAL_TABLET | Freq: Two times a day (BID) | ORAL | 3 refills | Status: DC

## 2019-09-24 MED ORDER — BISOPROLOL FUMARATE 5 MG PO TABS: 2.5000 mg | ORAL_TABLET | Freq: Every day | ORAL | 3 refills | Status: DC

## 2019-09-24 MED ORDER — FUROSEMIDE 20 MG PO TABS: 20.0000 mg | ORAL_TABLET | Freq: Every day | ORAL | 3 refills | Status: DC

## 2019-09-24 MED ORDER — POTASSIUM CHLORIDE CRYS ER 20 MEQ PO TBCR
20.0000 meq | EXTENDED_RELEASE_TABLET | Freq: Every day | ORAL | 3 refills | Status: DC
Start: 2019-09-28 — End: 2019-11-02

## 2019-09-24 MED ORDER — BISOPROLOL FUMARATE 5 MG PO TABS
2.5000 mg | ORAL_TABLET | Freq: Every day | ORAL | Status: DC
  Administered 2019-09-24: 2.5 mg via ORAL
  Filled 2019-09-24: qty 1

## 2019-09-24 MED ORDER — TRAZODONE HCL 50 MG PO TABS: 50.0000 mg | ORAL_TABLET | Freq: Every evening | ORAL | 5 refills | Status: DC | PRN

## 2019-09-24 NOTE — Discharge Instructions (Signed)
1)Decrease Eliquis/Apixaban to 2.5 mg twice daily  2)Avoid ibuprofen/Advil/Aleve/Motrin/Goody Powders/Naproxen/BC powders/Meloxicam/Diclofenac/Indomethacin and other Nonsteroidal anti-inflammatory medications as these will make you more likely to bleed and can cause stomach ulcers, can also cause Kidney problems.   3)Restart Furosemide/Lasix at 20 mg daily on Monday 09/28/19  4)Reduce Bisoprolol to 2.5 mg daily from 5 mg daily due to soft blood pressure which may lead to dizziness  5)Stop Verapamil due to soft blood pressure which may lead to dizziness  6)Restart Potassium tablets on Monday 09/28/19  7)Follow-up with cardiologist for reevaluation and repeat BMP/electrolyte and kidney test within the next 4 to 5 days  8)Very low-salt diet advised  9)Weigh yourself daily, call if you gain more than 3 pounds in 1 day or more than 5 pounds in 1 week as your diuretic medications may need to be adjusted  10)Limit your Fluid  intake to no more than 60 ounces (1.8 Liters) per day

## 2019-09-24 NOTE — Discharge Summary (Signed)
Bailey Bishop, is a 79 y.o. female  DOB September 24, 1940  MRN [id number].  Admission date:  09/18/2019  Admitting Physician  Barton Dubois, MD  Discharge Date:  09/24/2019   Primary MD  [name], MD  Recommendations for primary care physician for things to follow:   1)Decrease Eliquis/Apixaban to 2.5 mg twice daily  2)Avoid ibuprofen/Advil/Aleve/Motrin/Goody Powders/Naproxen/BC powders/Meloxicam/Diclofenac/Indomethacin and other Nonsteroidal anti-inflammatory medications as these will make you more likely to bleed and can cause stomach ulcers, can also cause Kidney problems.   3)Restart Furosemide/Lasix at 20 mg daily on Monday 09/28/19  4)Reduce Bisoprolol to 2.5 mg daily from 5 mg daily due to soft blood pressure which may lead to dizziness  5)Stop Verapamil due to soft blood pressure which may lead to dizziness  6)Restart Potassium tablets on Monday 09/28/19  7)Follow-up with cardiologist for reevaluation and repeat BMP/electrolyte and kidney test within the next 4 to 5 days  8)Very low-salt diet advised  9)Weigh yourself daily, call if you gain more than 3 pounds in 1 day or more than 5 pounds in 1 week as your diuretic medications may need to be adjusted  10)Limit your Fluid  intake to no more than 60 ounces (1.8 Liters) per day  Admission Diagnosis  Hypokalemia [E87.6] Acute on chronic diastolic HF (heart failure) (HCC) [I50.33] Acute on chronic diastolic congestive heart failure ([place]) [I50.33]   Discharge Diagnosis  Hypokalemia [E87.6] Acute on chronic diastolic HF (heart failure) (HCC) [I50.33] Acute on chronic diastolic congestive heart failure (HCC) [I50.33]    Active Problems:   Acute on chronic diastolic HF (heart failure) ([place])      Past Medical History:  Diagnosis Date  . Anxiety   . Arthritis   . Asthma   . Atrial fibrillation ([place])    a. s/p unsuccessful DCCV in 01/2017  and intolerant to Amiodarone --> Rate-control strategy pursued since b. failed Tikosyn in 11/2017  . Breast cancer ([place]) 2001   Right  . Essential hypertension   . GERD (gastroesophageal reflux disease)   . T10 vertebral fracture [facility])     Past Surgical History:  Procedure Laterality Date  . BIOPSY N/A 04/22/2018   Procedure: BIOPSY;  Surgeon: Aviva Signs, MD;  [facility];  Service: Gastroenterology;  Laterality: N/A;  . BIOPSY  09/06/2019   Procedure: BIOPSY;  Surgeon: Daneil Dolin, MD;  [facility];  Service: Endoscopy;;  gastric  . CARDIOVERSION N/A 02/18/2017   Procedure: CARDIOVERSION;  Surgeon: Josue Hector, MD;  Location: [facility];  Service: Cardiovascular;  Laterality: N/A;  . CARDIOVERSION N/A 12/04/2017   Procedure: CARDIOVERSION;  Surgeon: [name], MD;  Location: [place];  Service: Cardiovascular;  Laterality: N/A;  . CATARACT EXTRACTION W/PHACO  07/23/2011   Procedure: CATARACT EXTRACTION PHACO AND INTRAOCULAR LENS PLACEMENT ([place]);  Surgeon: Williams Che, MD;  Location: [facility];  Service: Ophthalmology;  Laterality: Right;  CDE:9.96  . CATARACT EXTRACTION W/PHACO Left 11/13/2018   Procedure: CATARACT EXTRACTION PHACO AND INTRAOCULAR LENS PLACEMENT (  [place]);  Surgeon: Baruch Goldmann, MD;  Location: [facility];  Service: Ophthalmology;  Laterality: Left;  left, CDE: 7.44  . CHOLECYSTECTOMY N/A 02/14/2015   Procedure: LAPAROSCOPIC CHOLECYSTECTOMY;  Surgeon: Aviva Signs, MD;  Location: [facility];  Service: General;  Laterality: N/A;  . COLONOSCOPY N/A 04/22/2018   Procedure: COLONOSCOPY;  Surgeon: Aviva Signs, MD;  [facility];  Service: Gastroenterology;  Laterality: N/A;  . COLONOSCOPY WITH PROPOFOL N/A 09/06/2019   Procedure: COLONOSCOPY WITH PROPOFOL;  Surgeon: Daneil Dolin, MD;  [facility];  Service: Endoscopy;  Laterality: N/A;  . ESOPHAGEAL DILATION  09/06/2019   Procedure: ESOPHAGEAL DILATION;  Surgeon:  Daneil Dolin, MD;  [facility];  Service: Endoscopy;;  . ESOPHAGOGASTRODUODENOSCOPY N/A 04/22/2018   Procedure: ESOPHAGOGASTRODUODENOSCOPY (EGD);  Surgeon: Aviva Signs, MD;  [facility];  Service: Gastroenterology;  Laterality: N/A;  . ESOPHAGOGASTRODUODENOSCOPY (EGD) WITH PROPOFOL N/A 09/06/2019   Procedure: ESOPHAGOGASTRODUODENOSCOPY (EGD) WITH PROPOFOL;  Surgeon: Daneil Dolin, MD;  [facility];  Service: Endoscopy;  Laterality: N/A;  . MASTECTOMY     bilateral mastectomy-right breast cancer-left taken by choice  . POLYPECTOMY  04/22/2018   Procedure: POLYPECTOMY;  Surgeon: Aviva Signs, MD;  [facility];  Service: Gastroenterology;;  . POLYPECTOMY  09/06/2019   Procedure: POLYPECTOMY;  Surgeon: Daneil Dolin, MD;  [facility];  Service: Endoscopy;;  cecal, ascending, hepatic flexure, sigmoid  . SEPTOPLASTY  1995       HPI  from the history and physical done on the day of admission:   Chief Complaint: leg swelling, SOB and orthopnea.  HPI: Bailey Bishop is a 79 y.o. female with medical history significant of A. Fib (on eliquis), diastolic HF, GERD, anxiety, HTN, CKD stage 3b, hx of right breast cancer (status post bilateral mastectomy), asthma (mild and intermittent), recent admission around 09/03/19--09/06/19 due to SOB and findings of anemia at that time (no transfusion needed and no active source of bleeding appreciated); patient was subsequently seen on 09/10/19 due to similar complaints at the ED and treated for bronchitis and discharge home. Patient presented to Ed today with complaints of worsening SOB, LE edema and orthopnea. Patient reported that over the last 3 weeks her symptoms has continue worsening and now she is SOB even at rest and noticing some orthopnea (needing 1 pillow at time to sleep); patient also expressed some swelling on her legs and on arrival to ED O2 sat was 87% on RA (improved to mid 90's with  2L). Patient reported increased fatigue and deconditioned; she denies fever, chills, CP, nausea, vomiting, dysuria, hematuria, focal weakness, melena, hematochezia or any other complaints.  Patient reported to be compliant with her meds and also with low sodium diet, denies palpitations.  ED Course: BNP elevated, CXR with vascular congestion and finfigs of early pulmonary edema, Hgb stable and renal function WNL for her. Positive hypokalemia. Cardiology service consulted and recommended admission for CHF exacerbation. Potassium repletion started in ED and 2-D echo ordered.   Review of Systems: As per HPI otherwise all other systems reviewed and are negative.      Hospital Course:    Brief Narrative:  Bailey Ohlsen Poindexteris a 79 y.o.femalewith medical history significant ofA. Fib (on eliquis), diastolic HF, GERD, anxiety, HTN, CKD stage 3b, hx of right breast cancer (status post bilateral mastectomy), asthma (mild and intermittent), recent admission around 09/03/19--09/06/19 due to SOB and findings of anemia at that time (no transfusion  needed and no active source of bleeding appreciated); patient was subsequently seen on 09/10/19 due to similar complaints at the ED and treated for bronchitis and discharge home. Patient presented to Ed today with complaints of worsening SOB, LE edema and orthopnea. Patient reported that over the last 3 weeks her symptoms has continue worsening and now she is SOB even at rest and noticing some orthopnea (needing 1 pillow at time to sleep); patient also expressed some swelling on her legs and on arrival to ED O2 sat was 87% on RA (improved to mid 90's with 2L). Patient reported increased fatigue and deconditioned; she denies fever, chills, CP, nausea, vomiting, dysuria, hematuria, focal weakness, melena, hematochezia or any other complaints.  Patient reported to be compliant with her meds and also with low sodium diet, denies palpitations.  ED Course:BNP  elevated, CXR with vascular congestion and finfigs of early pulmonary edema, Hgb stable and renal function WNL for her. Positive hypokalemia. Cardiology service consulted and recommended admission for CHF exacerbation. Potassium repletion started in ED and 2-D echo ordered.   Assessment & Plan: 1-HFrEF---acute on chronic combined systolic and diastolic heart failure -Repeat echo has demonstrated decrease in ejection fraction and severe global hypokinesis -Verapamil has been discontinued in the setting of decreased ejection fraction, -Reduce Bisoprolol to 2.5 mg daily from 5 mg daily due to soft blood pressure which may lead to dizziness -unable to use ACE/ARB given worsening renal function and soft BP. -Restart Furosemide/Lasix at 20 mg daily on Monday 09/28/19   2-diarrhea/nausea/dizziness -Appears to be functional in nature;   -Will continue to treat symptomatically with Lomotil -Patient advised to maintain adequate hydration. -Continue as needed antiemetics and meclizine.  3-history of atrial fibrillation -Rate controlled and stable -Decrease Eliquis/Apixaban to 2.5 mg twice daily for secondary prevention -Previously was using verapamil; switched over to bisoprolol due to low EF of 35 to 40%  4-hypokalemia/hyponatremia--due to diuresis -Magnesium within normal limits -Sodium is now up to 132 -BMP as outpatient as advised  5-GERD -Continue PPI.  6-history of asthma -No wheezing currently -Continue monitoring. -Continue as needed bronchodilator nebulizer tx.  7-anemia of chronic kidney disease -Appears stable -Repeat CBC as outpatient advised  8-AKI----acute kidney injury on CKD stage -  IIIb--worsening renal function appears to be due to diuresis - creatinine on admission=1.5  , baseline creatinine = 1.4 to 1.6   , creatinine is now= 2.3 -- renally adjust medications, avoid nephrotoxic agents / dehydration  / hypotension -Repeat BMP as outpatient advised -Hold Lasix  until 09/28/2019  9-history of breast cancer -Status post bilateral mastectomy -Continue surveillance as an outpatient  10-history of anxiety -stable mood -Continue as needed Xanax.   Code Status: Full code Family Communication: Husband at bedside Disposition:  Home   Consultants:   Cardiology service   Procedures:  See below for x-ray report 2D echo: Demonstrating global hypokinesis, ejection fraction 35 to 40%.  Mildly reduced right ventricular contraction, moderate mitral and tricuspid regurgitation and a relatively large posterior pericardial effusion with a smaller collection anteriorly and no clear evidence of tamponade.   Antimicrobials:   Discharge Condition: STABLE  Follow UP   Follow-up Information    Verta Ellen., NP Follow up on 10/02/2019.   Specialty: Cardiology Why: Cardiology Hospital Follow-up on 10/02/2019 at 8:30 AM. Will be in the [place] office due to availability. Please have blood work next Tuesday or Wednesday to check your kidney function (can have labs here at [facility]).  Contact information: [address]  [place] [address] [phone]               Diet and Activity recommendation:  As advised  Discharge Instructions    Discharge Instructions    Call MD for:  difficulty breathing, headache or visual disturbances   Complete by: As directed    Call MD for:  persistant dizziness or light-headedness   Complete by: As directed    Call MD for:  persistant nausea and vomiting   Complete by: As directed    Call MD for:  severe uncontrolled pain   Complete by: As directed    Call MD for:  temperature >100.4   Complete by: As directed    Diet - low sodium heart healthy   Complete by: As directed    Discharge instructions   Complete by: As directed    1)Decrease Eliquis/Apixaban to 2.5 mg twice daily  2)Avoid ibuprofen/Advil/Aleve/Motrin/Goody Powders/Naproxen/BC powders/Meloxicam/Diclofenac/Indomethacin and other  Nonsteroidal anti-inflammatory medications as these will make you more likely to bleed and can cause stomach ulcers, can also cause Kidney problems.   3)Restart Furosemide/Lasix at 20 mg daily on Monday 09/28/19  4)Reduce Bisoprolol to 2.5 mg daily from 5 mg daily due to soft blood pressure which may lead to dizziness  5)Stop Verapamil due to soft blood pressure which may lead to dizziness  6)Restart Potassium tablets on Monday 09/28/19  7)Follow-up with cardiologist for reevaluation and repeat BMP/electrolyte and kidney test within the next 4 to 5 days  8)Very low-salt diet advised  9)Weigh yourself daily, call if you gain more than 3 pounds in 1 day or more than 5 pounds in 1 week as your diuretic medications may need to be adjusted  10)Limit your Fluid  intake to no more than 60 ounces (1.8 Liters) per day   Increase activity slowly   Complete by: As directed         Discharge Medications     Allergies as of 09/24/2019      Reactions   Cardizem Cd [diltiazem Hcl Er Beads] Diarrhea   Amiodarone Nausea And Vomiting   Metoprolol Tartrate Diarrhea   Sulfa Antibiotics Nausea And Vomiting   Tomato Other (See Comments)   Burns stomach   Carvedilol Diarrhea, Rash   Chocolate Rash, Other (See Comments)   Dark chocolate      Medication List    STOP taking these medications   azithromycin 250 MG tablet Commonly known as: ZITHROMAX   verapamil 40 MG tablet Commonly known as: CALAN     TAKE these medications   acetaminophen 650 MG CR tablet Commonly known as: TYLENOL Take 650 mg by mouth every 8 (eight) hours as needed for pain.   albuterol 108 (90 Base) MCG/ACT inhaler Commonly known as: VENTOLIN HFA Inhale 1-2 puffs into the lungs every 6 (six) hours as needed for wheezing or shortness of breath.   ALPRAZolam 0.5 MG tablet Commonly known as: XANAX Take 0.5 mg by mouth at bedtime as needed for anxiety.   apixaban 2.5 MG Tabs tablet Commonly known as: ELIQUIS Take 1  tablet (2.5 mg total) by mouth 2 (two) times daily. What changed:   medication strength  how much to take  additional instructions   bisoprolol 5 MG tablet Commonly known as: ZEBETA Take 0.5 tablets (2.5 mg total) by mouth daily.   calcium citrate-vitamin D 315-200 MG-UNIT tablet Commonly known as: CITRACAL+D Take 1 tablet by mouth 2 (two) times daily.   fluticasone 50 MCG/ACT  nasal spray Commonly known as: FLONASE Place 1 spray into both nostrils daily as needed for allergies.   furosemide 20 MG tablet Commonly known as: LASIX Take 1 tablet (20 mg total) by mouth daily. Decrease Lasix to 20 mg daily starting on 09/28/2019, do not take Lasix until 09/28/2019 Start taking on: September 28, 2019 What changed:   medication strength  how much to take  additional instructions  These instructions start on September 28, 2019. If you are unsure what to do until then, ask your doctor or other care provider.   omeprazole 40 MG capsule Commonly known as: PRILOSEC Take 1 capsule (40 mg total) by mouth daily. What changed:   when to take this  reasons to take this   potassium chloride SA 20 MEQ tablet Commonly known as: KLOR-CON Take 1 tablet (20 mEq total) by mouth daily. Start on 09/28/2019 when you restart Lasix Start taking on: September 28, 2019 What changed:   additional instructions  These instructions start on September 28, 2019. If you are unsure what to do until then, ask your doctor or other care provider.   traZODone 50 MG tablet Commonly known as: DESYREL Take 1 tablet (50 mg total) by mouth at bedtime as needed for sleep.       Major procedures and Radiology Reports - PLEASE review detailed and final reports for all details, in brief -   DG Chest 2 View  Result Date: 09/23/2019 CLINICAL DATA:  Shortness of breath.  History of breast carcinoma EXAM: CHEST - 2 VIEW COMPARISON:  September 18, 2019 FINDINGS: There is persistent cardiomegaly with pulmonary vascularity within normal limits.  There is atelectatic change in each upper lobe region as well as in the right base. There are small pleural effusions bilaterally. There is no frank edema or airspace opacity. There is no appreciable adenopathy. There is aortic atherosclerosis. There is anterior wedging of a lower thoracic vertebral body. IMPRESSION: Persistent cardiomegaly. Areas of atelectatic change. Small pleural effusions bilaterally. No frank edema or airspace opacity. Aortic Atherosclerosis (ICD10-I70.0). Electronically Signed   By: Lowella Grip III M.D.   On: 09/23/2019 11:25   CT CHEST WO CONTRAST  Result Date: 09/05/2019 CLINICAL DATA:  Chronic dyspnea. Increased shortness of breath with exertion. EXAM: CT CHEST WITHOUT CONTRAST TECHNIQUE: Multidetector CT imaging of the chest was performed following the standard protocol without IV contrast. COMPARISON:  Radiograph 09/03/2019. FINDINGS: Cardiovascular: Aortic atherosclerosis. No aortic aneurysm. There is multi chamber cardiomegaly. Minimal mitral annulus and coronary artery calcifications. There is a small pericardial effusion, greatest inferiorly measuring up to 17 mm in depth. Mediastinum/Nodes: No enlarged mediastinal lymph nodes. No bulky hilar adenopathy, evaluation limited in the absence of IV contrast. Heterogeneously enlarged thyroid gland without discrete nodule. No esophageal wall thickening. Surgical clips in the right axilla without axillary adenopathy. No enlarged left axillary lymph nodes. Lungs/Pleura: Small bilateral pleural effusions, right greater than left. Associated compressive atelectasis. Streaky perifissural opacities in the right lower lobe may represent atelectasis, scarring, or small amount of fluid in the fissure. Compressive atelectasis in the lingula related to cardiomegaly. Right upper lobe 12 mm ground-glass nodule, series 4, image 40. No septal thickening or pulmonary edema. Mild motion artifact limits assessment of the mainstem bronchi, no  obvious intraluminal debris. Upper Abdomen: Subcentimeter hypodensity in the right hepatic dome, nonspecific. The stomach is distended with intraluminal fluid. Musculoskeletal: T12 compression fracture with greater than 50% loss of height anteriorly and posterior cortex involvement has progressed in severity from thoracic  spine CT 06/06/2019. Unchanged T10 and T11 vertebral body compression fractures. IMPRESSION: 1. Small bilateral pleural effusions, right greater than left, with compressive atelectasis. 2. Streaky perifissural opacities in the right lower lobe may represent atelectasis, scarring, or small amount of fluid in the fissure. 3. Right upper lobe 12 mm ground-glass nodule. Follow-up non-contrast CT recommended at 3-6 months to confirm persistence. If unchanged, and solid component remains <6 mm, annual CT is recommended until 5 years of stability has been established. If persistent these nodules should be considered highly suspicious if the solid component of the nodule is 6 mm or greater in size and enlarging. This recommendation follows the consensus statement: Guidelines for Management of Incidental Pulmonary Nodules Detected on CT Images: From the Fleischner Society 2017; Radiology 2017; 284:228-243. 4. Multi chamber cardiomegaly.  Small pericardial effusion. 5. Compression fracture of T12 has progressed in severity from March 2021 CT, now with posterior cortex involvement. Unchanged T10 and T11 vertebral body compression fractures. Aortic Atherosclerosis (ICD10-I70.0). Electronically Signed   By: Keith Rake M.D.   On: 09/05/2019 23:22   DG Chest Port 1 View  Result Date: 09/18/2019 CLINICAL DATA:  Dyspnea for 2 weeks EXAM: PORTABLE CHEST 1 VIEW COMPARISON:  09/10/2019 chest radiograph. FINDINGS: Stable cardiomediastinal silhouette with moderate cardiomegaly. No pneumothorax. Suggestion of small stable bilateral pleural effusions. New borderline mild pulmonary edema. Stable mild bibasilar  scarring versus atelectasis. IMPRESSION: 1. New borderline mild congestive heart failure. 2. Stable small bilateral pleural effusions with stable mild bibasilar scarring versus atelectasis. Electronically Signed   By: Ilona Sorrel M.D.   On: 09/18/2019 11:37   DG Chest Port 1 View  Result Date: 09/10/2019 CLINICAL DATA:  Shortness of breath EXAM: PORTABLE CHEST 1 VIEW COMPARISON:  September 03, 2019 FINDINGS: There is airspace opacity in portions of the left mid and lower lung regions with a small left pleural effusion. There is mild scarring in the right mid lung. Heart is enlarged with pulmonary vascularity normal. No adenopathy. There is aortic atherosclerosis. Bones are osteoporotic. There are surgical clips in the right axillary region. IMPRESSION: Airspace opacity concerning for pneumonia in portions of the left mid lower lung regions. Small left pleural effusion. Stable mild scarring right mid lung.  Right lung otherwise clear. Stable cardiomegaly. Aortic Atherosclerosis (ICD10-I70.0).  Bones osteoporotic. Electronically Signed   By: Lowella Grip III M.D.   On: 09/10/2019 17:31   DG Chest Portable 1 View  Result Date: 09/03/2019 CLINICAL DATA:  Increasing shortness of breath over the past 7 days. EXAM: PORTABLE CHEST 1 VIEW COMPARISON:  PA and lateral chest 03/29/2017. FINDINGS: There is cardiomegaly without edema. Lungs are emphysematous. No consolidative process, pneumothorax or effusion. Atherosclerosis noted. IMPRESSION: No acute disease. Cardiomegaly. Aortic Atherosclerosis (ICD10-I70.0) and Emphysema (ICD10-J43.9). Electronically Signed   By: Inge Rise M.D.   On: 09/03/2019 10:07   ECHOCARDIOGRAM COMPLETE  Result Date: 09/18/2019    ECHOCARDIOGRAM REPORT   Patient Name:   Bailey Bishop Date of Exam: 09/18/2019 Medical Rec #:  [id number]          Height:       64.0 in Accession #:    [id number]         Weight:       145.0 lb Date of Birth:  10-12-1940          BSA:          1.706 m  Patient Age:    79 years  BP:           105/64 mmHg Patient Gender: F                  HR:           88 bpm. Exam Location:  [facility] Procedure: 2D Echo, Cardiac Doppler and Color Doppler Indications:    Dyspnea  History:        Patient has prior history of Echocardiogram examinations, most                 recent 09/24/2016. CHF, Arrythmias:Atrial Fibrillation,                 Signs/Symptoms:Shortness of Breath; Risk Factors:Hypertension.                 CKD, LE edema.  Sonographer:    Dustin Flock RDCS Referring Phys: [place]  1. Left ventricular ejection fraction, by estimation, is 35 to 40%. The left ventricle has moderately decreased function. The left ventricle demonstrates global hypokinesis. Left ventricular diastolic parameters are indeterminate.  2. Right ventricular systolic function is mildly reduced. The right ventricular size is mildly enlarged. There is normal pulmonary artery systolic pressure. The estimated right ventricular systolic pressure is 10.9 mmHg.  3. Left atrial size was moderately dilated.  4. Right atrial size was severely dilated.  5. Large pericardial effusion. The pericardial effusion is posterior to the left ventricle. No obvious RV compromise to suggest tamponade and there are signs of chronicity. Small anterior effusion.  6. The mitral valve is grossly normal. Moderate mitral valve regurgitation.  7. Tricuspid valve regurgitation is moderate.  8. The aortic valve is tricuspid. Aortic valve regurgitation is not visualized. Mild to moderate aortic valve sclerosis/calcification is present, without any evidence of aortic stenosis. Moderate nodular calcification of the noncoronary cusp.  9. The inferior vena cava is dilated in size with <50% respiratory variability, suggesting right atrial pressure of 15 mmHg. FINDINGS  Left Ventricle: Left ventricular ejection fraction, by estimation, is 35 to 40%. The left ventricle has moderately decreased  function. The left ventricle demonstrates global hypokinesis. The left ventricular internal cavity size was normal in size. There is no left ventricular hypertrophy. Left ventricular diastolic parameters are indeterminate. Right Ventricle: The right ventricular size is mildly enlarged. No increase in right ventricular wall thickness. Right ventricular systolic function is mildly reduced. There is normal pulmonary artery systolic pressure. The tricuspid regurgitant velocity  is 2.13 m/s, and with an assumed right atrial pressure of 15 mmHg, the estimated right ventricular systolic pressure is 32.3 mmHg. Left Atrium: Left atrial size was moderately dilated. Right Atrium: Right atrial size was severely dilated. Pericardium: A large pericardial effusion is present. The pericardial effusion is posterior to the left ventricle. Mitral Valve: The mitral valve is grossly normal. Mild mitral annular calcification. Moderate mitral valve regurgitation. Tricuspid Valve: The tricuspid valve is grossly normal. Tricuspid valve regurgitation is moderate. Aortic Valve: The aortic valve is tricuspid. Aortic valve regurgitation is not visualized. Mild to moderate aortic valve sclerosis/calcification is present, without any evidence of aortic stenosis. Mild aortic valve annular calcification. Pulmonic Valve: The pulmonic valve was grossly normal. Pulmonic valve regurgitation is trivial. Aorta: The aortic root is normal in size and structure. Venous: The inferior vena cava is dilated in size with less than 50% respiratory variability, suggesting right atrial pressure of 15 mmHg. IAS/Shunts: The atrial septum is grossly normal.  LEFT VENTRICLE PLAX 2D LVIDd:  4.90 cm  Diastology LVIDs:         3.91 cm  LV e' lateral:   10.30 cm/s LV PW:         0.95 cm  LV E/e' lateral: 9.2 LV IVS:        0.99 cm  LV e' medial:    8.49 cm/s LVOT diam:     2.00 cm  LV E/e' medial:  11.1 LV SV:         36 LV SV Index:   21 LVOT Area:     3.14 cm   RIGHT VENTRICLE RV Basal diam:  3.97 cm RV S prime:     8.16 cm/s TAPSE (M-mode): 2.2 cm LEFT ATRIUM             Index       RIGHT ATRIUM           Index LA diam:        3.90 cm 2.29 cm/m  RA Area:     26.40 cm LA Vol (A2C):   77.0 ml 45.12 ml/m RA Volume:   91.30 ml  53.50 ml/m LA Vol (A4C):   76.6 ml 44.89 ml/m LA Biplane Vol: 80.8 ml 47.35 ml/m  AORTIC VALVE LVOT Vmax:   74.60 cm/s LVOT Vmean:  46.700 cm/s LVOT VTI:    0.116 m  AORTA Ao Root diam: 3.20 cm MITRAL VALVE               TRICUSPID VALVE MV Area (PHT): 6.71 cm    TR Peak grad:   18.1 mmHg MV Decel Time: 113 msec    TR Vmax:        213.00 cm/s MV E velocity: 94.60 cm/s MV A velocity: 30.10 cm/s  SHUNTS MV E/A ratio:  3.14        Systemic VTI:  0.12 m                            Systemic Diam: 2.00 cm Rozann Lesches MD Electronically signed by Rozann Lesches MD Signature Date/Time: 09/18/2019/4:12:26 PM    Final     Micro Results    Recent Results (from the past 240 hour(s))  SARS Coronavirus 2 by RT PCR (hospital order, performed in [facility] hospital lab) Nasopharyngeal Nasopharyngeal Swab     Status: None   Collection Time: 09/18/19  1:27 PM   Specimen: Nasopharyngeal Swab  Result Value Ref Range Status   SARS Coronavirus 2 NEGATIVE NEGATIVE Final    Comment: (NOTE) SARS-CoV-2 target nucleic acids are NOT DETECTED.  The SARS-CoV-2 RNA is generally detectable in upper and lower respiratory specimens during the acute phase of infection. The lowest concentration of SARS-CoV-2 viral copies this assay can detect is 250 copies / mL. A negative result does not preclude SARS-CoV-2 infection and should not be used as the sole basis for treatment or other patient management decisions.  A negative result may occur with improper specimen collection / handling, submission of specimen other than nasopharyngeal swab, presence of viral mutation(s) within the areas targeted by this assay, and inadequate number of viral copies (<250  copies / mL). A negative result must be combined with clinical observations, patient history, and epidemiological information.  Fact Sheet for Patients:   StrictlyIdeas.no  Fact Sheet for Healthcare Providers: BankingDealers.co.za  This test is not yet approved or  cleared by the [place] FDA and has been authorized for detection and/or diagnosis  of SARS-CoV-2 by FDA under an Emergency Use Authorization (EUA).  This EUA will remain in effect (meaning this test can be used) for the duration of the COVID-19 declaration under Section 564(b)(1) of the Act, 21 U.S.C. section 360bbb-3(b)(1), unless the authorization is terminated or revoked sooner.  Performed at [facility], [address], [place], [place] [address]    Today   Subjective    Bailey Bishop today has no new complaints          Patient has been seen and examined prior to discharge   Objective   Blood pressure (!) 90/53, pulse 67, temperature 98.1 F (36.7 C), temperature source Oral, resp. rate 20, height 5\' 4"  (1.626 m), weight 68.4 kg, SpO2 97 %.   Intake/Output Summary (Last 24 hours) at 09/24/2019 1100 Last data filed at 09/24/2019 0500 Gross per 24 hour  Intake 825.82 ml  Output 800 ml  Net 25.82 ml    Exam Gen:- Awake Alert, no acute distress  HEENT:- [place].AT, No sclera icterus Neck-Supple Neck,No JVD,.  Lungs-  CTAB , good air movement bilaterally  CV- S1, S2 normal, regular Abd-  +ve B.Sounds, Abd Soft, No tenderness,    Extremity/Skin:- No  edema,   good pulses Psych-affect is appropriate, oriented x3 Neuro-no new focal deficits, no tremors    Data Review   CBC w Diff:  Lab Results  Component Value Date   WBC 8.6 09/18/2019   HGB 8.3 (L) 09/18/2019   HCT 27.0 (L) 09/18/2019   PLT 297 09/18/2019   LYMPHOPCT 10 09/18/2019   MONOPCT 9 09/18/2019   EOSPCT 3 09/18/2019   BASOPCT 0 09/18/2019    CMP:  Lab Results  Component Value Date    NA 132 (L) 09/24/2019   NA 142 10/24/2016   K 4.5 09/24/2019   CL 96 (L) 09/24/2019   CO2 24 09/24/2019   BUN 29 (H) 09/24/2019   BUN 16 10/24/2016   CREATININE 2.35 (H) 09/24/2019   PROT 7.3 09/03/2019   ALBUMIN 3.5 09/03/2019   BILITOT 1.4 (H) 09/03/2019   ALKPHOS 123 09/03/2019   AST 22 09/03/2019   ALT 10 09/03/2019  .   Total Discharge time is about 33 minutes  Roxan Hockey M.D on 09/24/2019 at 11:00 AM  Go to [email or web address] -  for contact info  [facility] - Office  [phone]

## 2019-09-24 NOTE — Progress Notes (Signed)
Repeat CXR without frank edema. We have held diuretics last few days due to AKI and low sodium, given back gentle IVFs. Her SOB has resolved, has not had O2 requirement in several days. Na has improved, mild worsening of renal function despite being off diuretics last few days and receiving IVFs, defer any alternative workup to primary team. Would continue to hold diuretic, restart her home dose of 40mg  daily in 2 days. Will need repeat labs next week. Soft bp's and renal function have limited her regimen for heart failure. Regarding her afib she remains rate controlled on bisoprolol, dose lowered due to soft bp's. Patient favors being discharged, would require bmet early next week and very close follow up if discharged.    Carlyle Dolly MD

## 2019-09-25 ENCOUNTER — Other Ambulatory Visit: Payer: Self-pay | Admitting: *Deleted

## 2019-09-25 NOTE — Patient Outreach (Signed)
[place] [facility]) Care Management  09/25/2019  [place] 07/05/1940 [id number]  [name] Care coordination- Pt opted out of ALL [facility] services on September 15 2019   [name] RN CM received this patient as a post hospital referral on 09/25/19 This patient has opted out of [facility]  She was last called on 09/15/19 for EMMI general, she opted out of ALL [facility] services on 09/15/19 and a message was sent to [place] requesting she be removed from ALL calls from [facility] as she refused ALL [facility] services  Plan [facility] RN CM updated [facility] CMA and [facility] hospital liaison who sent the referral   [place]. Lavina Hamman, RN, BSN, [place] Coordinator Office number [phone] Mobile number [phone]  Main [facility] number [phone] Fax number [phone]

## 2019-09-25 NOTE — Patient Outreach (Signed)
[place] [facility]) Care Management  09/25/2019  Bailey Bishop 1940-05-08 [id number]   [place] [facility]) Care Management case closure      Case not opened case closed after collaboration with [facility] hospital liaison  Plan [facility] RN CM will close case -Consumer is eligible for program but refused participation on 09/15/19 outreach  Requested to opt out of ALL [facility] services   [place]. Lavina Hamman, RN, BSN, [place] Coordinator Office number [phone] Mobile number [phone]  Main [facility] number [phone] Fax number [phone]

## 2019-09-28 NOTE — Progress Notes (Signed)
Cardiology Office Note  Date: 09/29/2019   ID: Bailey Bishop, DOB 05/09/1940, MRN [id number]  PCP:  Bailey Squibb, MD  Cardiologist:  Kate Sable, MD Electrophysiologist:  [name], MD   Chief Complaint: Dyspnea on exertion/shortness of breath, persistent atrial fibrillation, HTN, HFrEF, Stage IV Renal disease, anemia likely secondary to renal disease.Bailey Bishop  History of Present Illness: Bailey Bishop is a 79 y.o. female with a history of chronic diastolic heart failure, persistent atrial fibrillation (status post unsuccessful DCCV in 02/12/2017 intolerant to amiodarone in the past, started on Tikosyn 11/2017 and failed therapy, intolerant to Lopressor, Toprol, Coreg, Cardizem and Cardizem CD in the past), hypertension, asthma, HTN, asthma, arthritis, GERD, CKD stage 4.  Had seen [name] 02/13/2019 and he reported intermittent palpitations but no progressive symptoms.  He reviewed options for PPM placement and AV node ablation.  These options were not pursued at that time due to the patient being not overly symptomatic.  She called the office 05/16/2019 reporting frequent UTIs and asthma thought to be secondary to her verapamil.  She wished to stop verapamil and restart amiodarone.  Bailey Bishop at atrial fibrillation clinic had her stop verapamil and restart amiodarone 200 mg daily on 04/30/2019.  She showed rate controlled atrial fibrillation after restarting.  Last saw Bailey Bishop, [place] on 07/14/2019.  Feeling well overall and great on amiodarone.  She did report some palpitations but heart rate was typically in the 60s to 80s when checked at home   Recent [name] emergency room visit on 09/10/2019 for shortness of breath.  She had been recently admitted for shortness of breath felt to be related to decreased hemoglobin unexplained via colonoscopy and endoscopy.  She described feeling as bad as when she was admitted.  Hx of at night sleeping sitting on the side of the bed  to catch her breath on several occasions throughout the night.  She had evidence of possible pneumonia on her chest x-ray and a slightly elevated BNP along with pedal edema.   Echocardiogram on 09/18/2019 showed decreased LV EF 35 to 40%.  Global hypokinesis, left ventricular diastolic parameters indeterminate.  Mild left atrial dilation, right atrium severely dilated.  Large pericardial effusion.  Moderate mitral valve and tricuspid valve regurgitation She was treated with Zithromax and an increased dose of Lasix.  Patient was concerned that she had been on amiodarone may have caused lung damage as a result.  A referral was placed for pulmonology for consideration of pulmonary function testing.  She had a creatinine of 1.65 and GFR of 29.  Hemoglobin 8.5 and hematocrit 27.3.  Troponin of 12, BNP 848.   Renal function has been steadily decreasing over the last 8 days.  5 days ago creatinine was 2.35, GFR was 19.  She has never seen a nephrologist.  She complains of increased weight gain approximately 7 pounds since 09/10/2019 when she weighed 145 pounds.  On today's visit she weighs 152 pounds.  Her Lasix dose was decreased on discharge from recent hospital visit to 20 mg daily.  Husband states she gets significantly short of breath when performing normal activities.  Past Medical History:  Diagnosis Date  . Anxiety   . Arthritis   . Asthma   . Atrial fibrillation ([place])    a. s/p unsuccessful DCCV in 01/2017 and intolerant to Amiodarone --> Rate-control strategy pursued since b. failed Tikosyn in 11/2017  . Breast cancer (Bailey [name] 2001   Right  . Essential hypertension   .  GERD (gastroesophageal reflux disease)   . T10 vertebral fracture [facility])     Past Surgical History:  Procedure Laterality Date  . BIOPSY N/A 04/22/2018   Procedure: BIOPSY;  Surgeon: Aviva Signs, MD;  [facility];  Service: Gastroenterology;  Laterality: N/A;  . BIOPSY  09/06/2019   Procedure: BIOPSY;  Surgeon: Daneil Dolin, MD;  Location: [facility];  Service: Endoscopy;;  gastric  . CARDIOVERSION N/A 02/18/2017   Procedure: CARDIOVERSION;  Surgeon: Josue Hector, MD;  Location: [facility] ENDOSCOPY;  Service: Cardiovascular;  Laterality: N/A;  . CARDIOVERSION N/A 12/04/2017   Procedure: CARDIOVERSION;  Surgeon: [name], MD;  Location: [facility];  Service: Cardiovascular;  Laterality: N/A;  . CATARACT EXTRACTION W/PHACO  07/23/2011   Procedure: CATARACT EXTRACTION PHACO AND INTRAOCULAR LENS PLACEMENT ([place]);  Surgeon: Williams Che, MD;  Location: [facility] ORS;  Service: Ophthalmology;  Laterality: Right;  CDE:9.96  . CATARACT EXTRACTION W/PHACO Left 11/13/2018   Procedure: CATARACT EXTRACTION PHACO AND INTRAOCULAR LENS PLACEMENT (I[facility]);  Surgeon: Baruch Goldmann, MD;  Location: [facility] OR[facility];  Service: Ophthalmology;  Laterality: Left;  left, CDE: 7.44  . CHOLECYSTECTOMY N/A 02/14/2015   Procedure: LAPAROSCOPIC CHOLECYSTECTOMY;  Surgeon: Aviva Signs, MD;  Location: [facility] ORS;  Service: General;  Laterality: N/A;  . COLONOSCOPY N/A 04/22/2018   Procedure: COLONOSCOPY;  Surgeon: Aviva Signs, MD;  [facility];  Service: Gastroenterology;  Laterality: N/A;  . COLONOSCOPY WITH PROPOFOL N/A 09/06/2019   Procedure: COLONOSCOPY WITH PROPOFOL;  Surgeon: Daneil Dolin, MD;  [facility];  Service: Endoscopy;  Laterality: N/A;  . ESOPHAGEAL DILATION  09/06/2019   Procedure: ESOPHAGEAL DILATION;  Surgeon: Daneil Dolin, MD;  [facility];  Service: Endoscopy;;  . ESOPHAGOGASTRODUODENOSCOPY N/A 04/22/2018   Procedure: ESOPHAGOGASTRODUODENOSCOPY (EGD);  Surgeon: Aviva Signs, MD;  [facility];  Service: Gastroenterology;  Laterality: N/A;  . ESOPHAGOGASTRODUODENOSCOPY (EGD) WITH PROPOFOL N/A 09/06/2019   Procedure: ESOPHAGOGASTRODUODENOSCOPY (EGD) WITH PROPOFOL;  Surgeon: Daneil Dolin, MD;  [facility];  Service: Endoscopy;  Laterality: N/A;  . MASTECTOMY      bilateral mastectomy-right breast cancer-left taken by choice  . POLYPECTOMY  04/22/2018   Procedure: POLYPECTOMY;  Surgeon: Aviva Signs, MD;  [facility];  Service: Gastroenterology;;  . POLYPECTOMY  09/06/2019   Procedure: POLYPECTOMY;  Surgeon: Daneil Dolin, MD;  [facility];  Service: Endoscopy;;  cecal, ascending, hepatic flexure, sigmoid  . SEPTOPLASTY  1995    Current Outpatient Medications  Medication Sig Dispense Refill  . acetaminophen (TYLENOL) 650 MG CR tablet Take 650 mg by mouth every 8 (eight) hours as needed for pain.    Bailey Bishop albuterol (VENTOLIN HFA) 108 (90 Base) MCG/ACT inhaler Inhale 1-2 puffs into the lungs every 6 (six) hours as needed for wheezing or shortness of breath.    . ALPRAZolam (XANAX) 0.5 MG tablet Take 0.5 mg by mouth at bedtime as needed for anxiety.     Bailey Bishop apixaban (ELIQUIS) 2.5 MG TABS tablet Take 1 tablet (2.5 mg total) by mouth 2 (two) times daily. 60 tablet 3  . bisoprolol (ZEBETA) 5 MG tablet Take 0.5 tablets (2.5 mg total) by mouth daily. 15 tablet 3  . calcium citrate-vitamin D (CITRACAL+D) 315-200 MG-UNIT tablet Take 1 tablet by mouth 2 (two) times daily.    . fluticasone (FLONASE) 50 MCG/ACT nasal spray Place 1 spray into both nostrils daily as needed for allergies.     Bailey Bishop  furosemide (LASIX) 20 MG tablet Take 1 tablet (20 mg total) by mouth daily. Decrease Lasix to 20 mg daily starting on 09/28/2019, do not take Lasix until 09/28/2019 30 tablet 3  . omeprazole (PRILOSEC) 40 MG capsule Take 40 mg by mouth as needed.    . potassium chloride SA (KLOR-CON) 20 MEQ tablet Take 1 tablet (20 mEq total) by mouth daily. Start on 09/28/2019 when you restart Lasix 90 tablet 3  . traZODone (DESYREL) 50 MG tablet Take 1 tablet (50 mg total) by mouth at bedtime as needed for sleep. 30 tablet 5   No current facility-administered medications for this visit.   Allergies:  Cardizem cd [diltiazem hcl er beads], Amiodarone, Metoprolol tartrate, Sulfa  antibiotics, Tomato, Carvedilol, and Chocolate   Social History: The patient  reports that she quit smoking about 36 years ago. Her smoking use included cigarettes. She has a 28.00 pack-year smoking history. She has never used smokeless tobacco. She reports that she does not drink alcohol and does not use drugs.   Family History: The patient's family history includes Aneurysm in her father; Cancer in her mother, sister, and sister.   ROS:  Please see the history of present illness. Otherwise, complete review of systems is positive for none.  All other systems are reviewed and negative.   Physical Exam: VS:  BP 115/60   Pulse 85   Ht 5\' 4"  (1.626 m)   Wt 152 lb 9.6 oz (69.2 kg)   SpO2 97%   BMI 26.19 kg/m , BMI Body mass index is 26.19 kg/m.  Wt Readings from Last 3 Encounters:  09/29/19 152 lb 9.6 oz (69.2 kg)  09/24/19 150 lb 12.7 oz (68.4 kg)  09/10/19 145 lb (65.8 kg)    General: Patient appears comfortable at rest. Neck: Supple, no elevated JVP or carotid bruits, no thyromegaly. Lungs: Clear to auscultation, nonlabored breathing at rest. Cardiac: Irregularly irregular rate and rhythm, no S3 or significant systolic murmur, no pericardial rub. Extremities: Mild pitting edema, distal pulses 2+. Skin: Warm and dry. Musculoskeletal: No kyphosis. Neuropsychiatric: Alert and oriented x3, affect grossly appropriate.  ECG:  EKG on 09/18/2019 showed atrial fibrillation with a rate of 87, paired ventricular premature complexes, left anterior fascicular block, anterior infarct old, minimal ST depression, prolonged QT interval QT/QTc 503/559  Recent Labwork: 09/03/2019: ALT 10; AST 22 09/18/2019: B Natriuretic Peptide 822.0; Hemoglobin 8.3; Magnesium 2.2; Platelets 297; TSH 2.037 09/24/2019: BUN 29; Creatinine, Ser 2.35; Potassium 4.5; Sodium 132     Component Value Date/Time   CHOL 109 09/24/2016 0438   TRIG 66 09/24/2016 0438   HDL 34 (L) 09/24/2016 0438   CHOLHDL 3.2 09/24/2016 0438     VLDL 13 09/24/2016 0438   LDLCALC 62 09/24/2016 0438    Other Studies Reviewed Today:  Echocardiogram 09/18/2019  1. Left ventricular ejection fraction, by estimation, is 35 to 40%. The left ventricle has moderately decreased function. The left ventricle demonstrates global hypokinesis. Left ventricular diastolic parameters are indeterminate. 2. Right ventricular systolic function is mildly reduced. The right ventricular size is mildly enlarged. There is normal pulmonary artery systolic pressure. The estimated right ventricular systolic pressure is 06.2 mmHg. 3. Left atrial size was moderately dilated. 4. Right atrial size was severely dilated. 5. Large pericardial effusion. The pericardial effusion is posterior to the left ventricle. No obvious RV compromise to suggest tamponade and there are signs of chronicity. Small anterior effusion. 6. The mitral valve is grossly normal. Moderate mitral valve regurgitation. 7. Tricuspid  valve regurgitation is moderate. 8. The aortic valve is tricuspid. Aortic valve regurgitation is not visualized. Mild to moderate aortic valve sclerosis/calcification is present, without any evidence of aortic stenosis. Moderate nodular calcification of the noncoronary cusp. 9. The inferior vena cava is dilated in size with <50% respiratory variability, suggesting right atrial pressure of 15 mmHg.   Echocardiogram: 09/2016 Study Conclusions   - Left ventricle: The cavity size was normal. Wall thickness was  normal. Systolic function was normal. The estimated ejection  fraction was in the range of 55% to 60%. Wall motion was normal;  there were no regional wall motion abnormalities.  - Aortic valve: Mildly to moderately calcified annulus. Mildly  thickened leaflets. Valve area (VTI): 2.92 cm^2. Valve area  (Vmax): 2.73 cm^2. Valve area (Vmean): 2.61 cm^2.  - Mitral valve: There was moderate regurgitation.  - Left atrium: The atrium was severely  dilated.  - Right atrium: The atrium was severely dilated.  - Atrial septum: No defect or patent foramen ovale was identified.  - Pulmonary arteries: Systolic pressure was moderately increased.  PA peak pressure: 49 mm Hg (S).  - Pericardium, extracardiac: There is a small circumferential  pericardial effusion.  - Technically adequate study.   Event Monitor: 11/2017 1. Atrial fib with a controlled VR and a RVR 2. NSVT 3. No prolonged pauses or bradycardia  Gregg Taylor,M.D.  Assessment and Plan:  1. DOE (dyspnea on exertion)   2. Chronic diastolic heart failure (HCC)   3. Persistent atrial fibrillation ([place])   4. History of anemia due to chronic kidney disease   5. Chronic kidney disease, stage 4 (severe) (HCC)   6. Symptomatic anemia   7. Stage 3 chronic kidney disease, unspecified whether stage 3a or 3b CKD    1. DOE (dyspnea on exertion) Recent increase complaints of shortness of breath with recent admission to [facility] for atrial fibrillation  2.  HFrEF.  Echocardiogram on 09/18/2019 showed decreased LV EF 35 to 40%.  Global hypokinesis, left ventricular diastolic parameters indeterminate.  Mild left atrial dilation, right atrium severely dilated.  Large pericardial effusion.  Moderate mitral valve and tricuspid valve regurgitation.  Refer to heart failure clinic for management.  Verapamil was discontinued and setting of decreased EF and bisoprolol dose was reduced to 2.5 due to soft blood pressures.  ACE/arms were unable to be used given worsening renal function.  Lasix dose was decreased to 20 and patient was to continue Lasix 20 mg once daily.  We will maintain that dose until patient is seen by heart failure clinic and nephrology due to decreased renal function. Continues with DOE and lower extremity edema, and weight gain  3. Persistent atrial fibrillation (HCC) Currently on bisoprolol 2.5 mg daily.  Eliquis 2.5 mg p.o. twice daily.  Rate is controlled today  with heart rate of 85.  Recent admission to [facility] hospitalization for DOE and atrial fibrillation.  Continue bisoprolol 2.5 mg daily and Eliquis 2.5 mg p.o. twice daily.  4. History of anemia due to chronic kidney disease Recent colonoscopy and EGD for anemia.  Recent hemoglobin and hematocrit were 8.5 and 27.3 respectively.  Likely anemia from chronic renal disease.  Have referred to Dr. Theador Hawthorne for evidence of stage IV renal failure.  5.  Stage IV renal failure. Recent decreased renal function.  Most recent creatinine 2.34 and GFR 19.  Please refer to Dr. Theador Hawthorne nephrology for stage IV renal failure.  Get repeat basic metabolic panel and magnesium today.  Medication Adjustments/Labs and Tests Ordered: Current medicines are reviewed at length with the patient today.  Concerns regarding medicines are outlined above.   Disposition: Follow-up with cardiology or APP in 3 months  Signed, Levell July, NP 09/29/2019 12:25 PM    [facility] at [place], [place], [place] [address] Phone: [phone]; Fax: [phone]

## 2019-09-29 ENCOUNTER — Ambulatory Visit: Payer: Medicare Other | Admitting: Family Medicine

## 2019-09-29 ENCOUNTER — Telehealth: Payer: Self-pay

## 2019-09-29 ENCOUNTER — Telehealth: Payer: Self-pay | Admitting: Internal Medicine

## 2019-09-29 ENCOUNTER — Encounter: Payer: Self-pay | Admitting: Family Medicine

## 2019-09-29 ENCOUNTER — Other Ambulatory Visit: Payer: Self-pay

## 2019-09-29 VITALS — BP 115/60 | HR 85 | Ht 64.0 in | Wt 152.6 lb

## 2019-09-29 DIAGNOSIS — N189 Chronic kidney disease, unspecified: Secondary | ICD-10-CM | POA: Diagnosis not present

## 2019-09-29 DIAGNOSIS — R0600 Dyspnea, unspecified: Secondary | ICD-10-CM | POA: Diagnosis not present

## 2019-09-29 DIAGNOSIS — D649 Anemia, unspecified: Secondary | ICD-10-CM

## 2019-09-29 DIAGNOSIS — N183 Chronic kidney disease, stage 3 unspecified: Secondary | ICD-10-CM

## 2019-09-29 DIAGNOSIS — Z862 Personal history of diseases of the blood and blood-forming organs and certain disorders involving the immune mechanism: Secondary | ICD-10-CM

## 2019-09-29 DIAGNOSIS — N184 Chronic kidney disease, stage 4 (severe): Secondary | ICD-10-CM | POA: Diagnosis not present

## 2019-09-29 DIAGNOSIS — I5032 Chronic diastolic (congestive) heart failure: Secondary | ICD-10-CM

## 2019-09-29 DIAGNOSIS — R0609 Other forms of dyspnea: Secondary | ICD-10-CM

## 2019-09-29 DIAGNOSIS — I4819 Other persistent atrial fibrillation: Secondary | ICD-10-CM

## 2019-09-29 NOTE — Telephone Encounter (Signed)
Spoke with pt. Pt has been having water diarrhea since her TCS on 09/06/19. Pt states that she is having diarrhea several times of day and it's watery each time. Pt wasn't able to tell, how many Bms she is having a day. Pt hasn't tried any otc antidiarrhea medications to stop the diarrhea, pt reports no n/v, no abdominal pain. Pt has seen some blood on her tissue when wiping and states she has some redness, itching and burning on her rectum. Pt is using otc topical Barrier Cream and otc topical hemorrhoid cr.

## 2019-09-29 NOTE — Telephone Encounter (Signed)
Pt had procedure by RMR last month and she is having problems with diarrhea. She would like to speak with the nurse. [phone]

## 2019-09-29 NOTE — Telephone Encounter (Signed)
VM received from pt. Called pt back and lmom. Waiting on a return call.

## 2019-09-29 NOTE — Patient Instructions (Addendum)
Medication Instructions:   Your physician recommends that you continue on your current medications as directed. Please refer to the Current Medication list given to you today.  Labwork:   Your physician recommends that you return for lab work in: TODAY to check your BMET & Mg levels. This may be done at [name] or [facility] Monday-Friday from 8:00 am - 4:00 pm. No appointment is needed.  Testing/Procedures:  NONE  Follow-Up:  Your physician recommends that you schedule a follow-up appointment in: 3 months (office).  Any Other Special Instructions Will Be Listed Below (If Applicable).  You have been referred to Dr. Theador Hawthorne (nephrologist)  You have been referred to the [facility].  If you need a refill on your cardiac medications before your next appointment, please call your pharmacy.

## 2019-09-29 NOTE — Telephone Encounter (Signed)
Pt called back and left a message. Called pt back and wasn't able to get pt. Will call pt back.

## 2019-09-30 ENCOUNTER — Telehealth: Payer: Self-pay | Admitting: Internal Medicine

## 2019-09-30 DIAGNOSIS — I5032 Chronic diastolic (congestive) heart failure: Secondary | ICD-10-CM | POA: Diagnosis not present

## 2019-09-30 DIAGNOSIS — I4819 Other persistent atrial fibrillation: Secondary | ICD-10-CM | POA: Diagnosis not present

## 2019-09-30 DIAGNOSIS — R0600 Dyspnea, unspecified: Secondary | ICD-10-CM | POA: Diagnosis not present

## 2019-09-30 DIAGNOSIS — N184 Chronic kidney disease, stage 4 (severe): Secondary | ICD-10-CM | POA: Diagnosis not present

## 2019-09-30 LAB — BASIC METABOLIC PANEL
BUN/Creatinine Ratio: 13 (calc) (ref 6–22)
BUN: 27 mg/dL — ABNORMAL HIGH (ref 7–25)
CO2: 27 mmol/L (ref 20–32)
Calcium: 8.9 mg/dL (ref 8.6–10.4)
Chloride: 99 mmol/L (ref 98–110)
Creat: 2.15 mg/dL — ABNORMAL HIGH (ref 0.60–0.88)
Glucose, Bld: 116 mg/dL (ref 65–139)
Potassium: 4 mmol/L (ref 3.5–5.3)
Sodium: 136 mmol/L (ref 135–146)

## 2019-09-30 LAB — MAGNESIUM: Magnesium: 2.1 mg/dL (ref 1.5–2.5)

## 2019-09-30 NOTE — Telephone Encounter (Signed)
Patient called inquiring about her prescription being sent in, very upset and said it is really bothering her

## 2019-09-30 NOTE — Telephone Encounter (Signed)
Pt called to follow up from her message yesterday about getting a prescription called in. [phone]

## 2019-10-01 NOTE — Telephone Encounter (Signed)
Noted  

## 2019-10-01 NOTE — Telephone Encounter (Signed)
Routing to LSL. Please review. Pt has called a few more times in reference to her diarrhea and redness/irratation on her rectum from having the diarrhea.

## 2019-10-01 NOTE — Telephone Encounter (Signed)
Glad to hear. 

## 2019-10-01 NOTE — Telephone Encounter (Signed)
Patient seen by EG and RMR during recent hospitalization. Never seen in office.   Does not look like she had diarrhea while in the hospital.   Will forward for EG review but would offer her office visit if persistent diarrhea.

## 2019-10-01 NOTE — Telephone Encounter (Signed)
EG. Pt wants to see if her diarrhea improves on it's own. Pt will call back if not improving.

## 2019-10-01 NOTE — Telephone Encounter (Signed)
Unrelated to procedure.  Lets call her today and see how she is doing.

## 2019-10-01 NOTE — Telephone Encounter (Signed)
Please disregard message. I've spoke with pt since sending the message to LSL. Pt started taking Imodium last night and feels a little better. Pt will call back if the Imodium doesn't help.

## 2019-10-02 ENCOUNTER — Ambulatory Visit: Payer: Medicare Other | Admitting: Family Medicine

## 2019-10-02 ENCOUNTER — Other Ambulatory Visit (HOSPITAL_COMMUNITY): Payer: Self-pay | Admitting: Nephrology

## 2019-10-02 ENCOUNTER — Other Ambulatory Visit: Payer: Self-pay | Admitting: Nephrology

## 2019-10-02 DIAGNOSIS — N1832 Chronic kidney disease, stage 3b: Secondary | ICD-10-CM

## 2019-10-02 DIAGNOSIS — N170 Acute kidney failure with tubular necrosis: Secondary | ICD-10-CM

## 2019-10-02 DIAGNOSIS — N281 Cyst of kidney, acquired: Secondary | ICD-10-CM

## 2019-10-02 DIAGNOSIS — I5022 Chronic systolic (congestive) heart failure: Secondary | ICD-10-CM

## 2019-10-02 DIAGNOSIS — E871 Hypo-osmolality and hyponatremia: Secondary | ICD-10-CM | POA: Diagnosis not present

## 2019-10-05 ENCOUNTER — Ambulatory Visit (HOSPITAL_COMMUNITY): Payer: Medicare Other | Admitting: Nurse Practitioner

## 2019-10-06 ENCOUNTER — Telehealth: Payer: Self-pay | Admitting: *Deleted

## 2019-10-06 NOTE — Telephone Encounter (Signed)
-----   Message from Verta Ellen., NP sent at 10/02/2019  8:08 AM EDT ----- Renal function remains decreased.  She has a referral to Dr. Theador Hawthorne for stage IV renal failure.  I do not know if you need to communicate this to the patient or not.  Its not much different than the last renal function test 8 days ago.  If anything does call her and remind her she needs to see the kidney doctor and ask her if their office has called her yet for an appointment.  Tell her it is very important to see the kidney doctor.  Thanks

## 2019-10-06 NOTE — Telephone Encounter (Signed)
Laurine Blazer, LPN  1/56/1537 9:43 AM EDT Back to Top    [name] (husband) notified & verbalized understanding.    Copy to pcp,

## 2019-10-08 ENCOUNTER — Other Ambulatory Visit: Payer: Self-pay

## 2019-10-08 ENCOUNTER — Ambulatory Visit (HOSPITAL_COMMUNITY)
Admission: RE | Admit: 2019-10-08 | Discharge: 2019-10-08 | Disposition: A | Discharge status: Home / Self Care | Payer: Medicare Other | Source: Ambulatory Visit | Attending: Nephrology | Admitting: Nephrology

## 2019-10-08 ENCOUNTER — Other Ambulatory Visit (HOSPITAL_COMMUNITY)
Admission: RE | Admit: 2019-10-08 | Discharge: 2019-10-08 | Disposition: A | Discharge status: Home / Self Care | Payer: Medicare Other | Source: Ambulatory Visit | Attending: Nephrology | Admitting: Nephrology

## 2019-10-08 ENCOUNTER — Ambulatory Visit (HOSPITAL_COMMUNITY): Payer: Medicare Other

## 2019-10-08 DIAGNOSIS — N1832 Chronic kidney disease, stage 3b: Secondary | ICD-10-CM

## 2019-10-08 DIAGNOSIS — I129 Hypertensive chronic kidney disease with stage 1 through stage 4 chronic kidney disease, or unspecified chronic kidney disease: Secondary | ICD-10-CM | POA: Diagnosis not present

## 2019-10-08 DIAGNOSIS — N184 Chronic kidney disease, stage 4 (severe): Secondary | ICD-10-CM | POA: Diagnosis not present

## 2019-10-08 DIAGNOSIS — N281 Cyst of kidney, acquired: Secondary | ICD-10-CM

## 2019-10-08 DIAGNOSIS — N170 Acute kidney failure with tubular necrosis: Secondary | ICD-10-CM

## 2019-10-08 DIAGNOSIS — I5022 Chronic systolic (congestive) heart failure: Secondary | ICD-10-CM

## 2019-10-08 DIAGNOSIS — Z79899 Other long term (current) drug therapy: Secondary | ICD-10-CM | POA: Diagnosis not present

## 2019-10-08 DIAGNOSIS — J90 Pleural effusion, not elsewhere classified: Secondary | ICD-10-CM | POA: Diagnosis not present

## 2019-10-08 DIAGNOSIS — E871 Hypo-osmolality and hyponatremia: Secondary | ICD-10-CM | POA: Diagnosis not present

## 2019-10-08 DIAGNOSIS — D638 Anemia in other chronic diseases classified elsewhere: Secondary | ICD-10-CM | POA: Insufficient documentation

## 2019-10-08 DIAGNOSIS — Q600 Renal agenesis, unilateral: Secondary | ICD-10-CM | POA: Diagnosis not present

## 2019-10-08 LAB — CBC WITH DIFFERENTIAL/PLATELET
Abs Immature Granulocytes: 0.02 10*3/uL (ref 0.00–0.07)
Basophils Absolute: 0 10*3/uL (ref 0.0–0.1)
Basophils Relative: 1 %
Eosinophils Absolute: 0.2 10*3/uL (ref 0.0–0.5)
Eosinophils Relative: 3 %
HCT: 26.2 % — ABNORMAL LOW (ref 36.0–46.0)
Hemoglobin: 8 g/dL — ABNORMAL LOW (ref 12.0–15.0)
Immature Granulocytes: 0 %
Lymphocytes Relative: 16 %
Lymphs Abs: 0.8 10*3/uL (ref 0.7–4.0)
MCH: 25.8 pg — ABNORMAL LOW (ref 26.0–34.0)
MCHC: 30.5 g/dL (ref 30.0–36.0)
MCV: 84.5 fL (ref 80.0–100.0)
Monocytes Absolute: 0.4 10*3/uL (ref 0.1–1.0)
Monocytes Relative: 8 %
Neutro Abs: 3.9 10*3/uL (ref 1.7–7.7)
Neutrophils Relative %: 72 %
Platelets: 244 10*3/uL (ref 150–400)
RBC: 3.1 MIL/uL — ABNORMAL LOW (ref 3.87–5.11)
RDW: 16.9 % — ABNORMAL HIGH (ref 11.5–15.5)
WBC: 5.4 10*3/uL (ref 4.0–10.5)
nRBC: 0 % (ref 0.0–0.2)

## 2019-10-08 LAB — COMPREHENSIVE METABOLIC PANEL
ALT: 13 U/L (ref 0–44)
AST: 22 U/L (ref 15–41)
Albumin: 3.8 g/dL (ref 3.5–5.0)
Alkaline Phosphatase: 99 U/L (ref 38–126)
Anion gap: 11 (ref 5–15)
BUN: 23 mg/dL (ref 8–23)
CO2: 25 mmol/L (ref 22–32)
Calcium: 8.6 mg/dL — ABNORMAL LOW (ref 8.9–10.3)
Chloride: 96 mmol/L — ABNORMAL LOW (ref 98–111)
Creatinine, Ser: 2.47 mg/dL — ABNORMAL HIGH (ref 0.44–1.00)
GFR calc Af Amer: 21 mL/min — ABNORMAL LOW (ref >60)
GFR calc non Af Amer: 18 mL/min — ABNORMAL LOW (ref >60)
Glucose, Bld: 101 mg/dL — ABNORMAL HIGH (ref 70–99)
Potassium: 3.6 mmol/L (ref 3.5–5.1)
Sodium: 132 mmol/L — ABNORMAL LOW (ref 135–145)
Total Bilirubin: 1.1 mg/dL (ref 0.3–1.2)
Total Protein: 7.5 g/dL (ref 6.5–8.1)

## 2019-10-08 LAB — FERRITIN: Ferritin: 10 ng/mL — ABNORMAL LOW (ref 11–307)

## 2019-10-08 LAB — VITAMIN D 25 HYDROXY (VIT D DEFICIENCY, FRACTURES): Vit D, 25-Hydroxy: 31.94 ng/mL (ref 30–100)

## 2019-10-08 LAB — IRON AND TIBC
Iron: 20 ug/dL — ABNORMAL LOW (ref 28–170)
Saturation Ratios: 4 % — ABNORMAL LOW (ref 10.4–31.8)
TIBC: 458 ug/dL — ABNORMAL HIGH (ref 250–450)
UIBC: 438 ug/dL

## 2019-10-08 LAB — HEPATITIS C ANTIBODY: HCV Ab: NONREACTIVE

## 2019-10-08 LAB — PROTEIN, URINE, 24 HOUR
Collection Interval-UPROT: 24 hours
Protein, Urine: 18 mg/dL
Urine Total Volume-UPROT: 500 mL

## 2019-10-08 LAB — HEMOGLOBIN A1C
Hgb A1c MFr Bld: 5.4 % (ref 4.8–5.6)
Mean Plasma Glucose: 108.28 mg/dL

## 2019-10-08 LAB — VITAMIN B12: Vitamin B-12: 312 pg/mL (ref 180–914)

## 2019-10-08 LAB — CREATININE CLEARANCE, URINE, 24 HOUR
Collection Interval-CRCL: 24 hours
Creatinine Clearance: 15 mL/min — ABNORMAL LOW (ref 75–115)
Creatinine, 24H Ur: 534 mg/d — ABNORMAL LOW (ref 600–1800)
Creatinine, Urine: 106.81 mg/dL
Urine Total Volume-CRCL: 500 mL

## 2019-10-08 LAB — PROTEIN / CREATININE RATIO, URINE
Creatinine, Urine: 106.49 mg/dL
Protein Creatinine Ratio: 0.18 mg/mg{Cre} — ABNORMAL HIGH (ref 0.00–0.15)
Total Protein, Urine: 19 mg/dL

## 2019-10-08 LAB — PHOSPHORUS: Phosphorus: 4.6 mg/dL (ref 2.5–4.6)

## 2019-10-08 LAB — MAGNESIUM: Magnesium: 2.2 mg/dL (ref 1.7–2.4)

## 2019-10-08 LAB — URIC ACID: Uric Acid, Serum: 10.2 mg/dL — ABNORMAL HIGH (ref 2.5–7.1)

## 2019-10-08 LAB — HEPATITIS B SURFACE ANTIGEN: Hepatitis B Surface Ag: NONREACTIVE

## 2019-10-09 ENCOUNTER — Telehealth: Payer: Self-pay | Admitting: Student

## 2019-10-09 ENCOUNTER — Ambulatory Visit (HOSPITAL_COMMUNITY): Payer: Medicare Other

## 2019-10-09 LAB — PTH, INTACT AND CALCIUM
Calcium, Total (PTH): 9 mg/dL (ref 8.7–10.3)
PTH: 109 pg/mL — ABNORMAL HIGH (ref 15–65)

## 2019-10-09 LAB — ANCA TITERS
Atypical P-ANCA titer: <1:20 {titer}
C-ANCA: <1:20 {titer}
P-ANCA: <1:20 {titer}

## 2019-10-09 LAB — PROTEIN ELECTROPHORESIS, SERUM
A/G Ratio: 0.9 (ref 0.7–1.7)
A/G Ratio: 1 (ref 0.7–1.7)
Albumin ELP: 3.4 g/dL (ref 2.9–4.4)
Albumin ELP: 3.5 g/dL (ref 2.9–4.4)
Alpha-1-Globulin: 0.3 g/dL (ref 0.0–0.4)
Alpha-1-Globulin: 0.3 g/dL (ref 0.0–0.4)
Alpha-2-Globulin: 0.7 g/dL (ref 0.4–1.0)
Alpha-2-Globulin: 0.7 g/dL (ref 0.4–1.0)
Beta Globulin: 1.1 g/dL (ref 0.7–1.3)
Beta Globulin: 1.1 g/dL (ref 0.7–1.3)
Gamma Globulin: 1.5 g/dL (ref 0.4–1.8)
Gamma Globulin: 1.6 g/dL (ref 0.4–1.8)
Globulin, Total: 3.6 g/dL (ref 2.2–3.9)
Globulin, Total: 3.7 g/dL (ref 2.2–3.9)
M-Spike, %: 0.3 g/dL — ABNORMAL HIGH
Total Protein ELP: 7.1 g/dL (ref 6.0–8.5)
Total Protein ELP: 7.1 g/dL (ref 6.0–8.5)

## 2019-10-09 LAB — HIV ANTIBODY (ROUTINE TESTING W REFLEX): HIV Screen 4th Generation wRfx: NONREACTIVE

## 2019-10-09 LAB — HEPATITIS B SURFACE ANTIBODY, QUANTITATIVE: Hep B S AB Quant (Post): <3.1 m[IU]/mL — ABNORMAL LOW (ref >9.9)

## 2019-10-09 LAB — GLOMERULAR BASEMENT MEMBRANE ANTIBODIES: GBM Ab: 3 units (ref 0–20)

## 2019-10-09 LAB — C4 COMPLEMENT: Complement C4, Body Fluid: 16 mg/dL (ref 12–38)

## 2019-10-09 LAB — KAPPA/LAMBDA LIGHT CHAINS
Kappa free light chain: 118 mg/L — ABNORMAL HIGH (ref 3.3–19.4)
Kappa, lambda light chain ratio: 1.61 (ref 0.26–1.65)
Lambda free light chains: 73.4 mg/L — ABNORMAL HIGH (ref 5.7–26.3)

## 2019-10-09 LAB — C3 COMPLEMENT: C3 Complement: 100 mg/dL (ref 82–167)

## 2019-10-09 NOTE — Telephone Encounter (Signed)
Husband says they guessed at her weight, they have not weighed her at home.   I asked them to weigh her mow, weight is 150 lbs.   Husband says her feet,legs,abdomen and waist are swollen and she is SOB.    Have reached out to nephrologist , have not heard back

## 2019-10-09 NOTE — Telephone Encounter (Signed)
     She has Stage 3-4 CKD and creatinine was elevated to 2.47 by recent labs on 10/08/2019. Weight was at 152 lbs during her office visit on 09/29/2019 and if now at 150 lbs, this is actually down from her prior baseline.  If she has experienced worsening edema, she can take an extra Lasix tablet today and tomorrow but would recommend reaching out to Nephrology again on Monday for further management given her CKD.  Signed, Erma Heritage, PA-C 10/09/2019, 4:41 PM Pager: [phone]

## 2019-10-09 NOTE — Telephone Encounter (Signed)
New Message    Pt c/o swelling: STAT is pt has developed SOB within 24 hours  1) How much weight have you gained and in what time span? 6-8 lb in a few days  2) If swelling, where is the swelling located? In her lower legs and feet  3) Are you currently taking a fluid pill? yes  4) Are you currently SOB?  yes  5) Do you have a log of your daily weights (if so, list)? no  6) Have you gained 3 pounds in a day or 5 pounds in a week? yes  7) Have you traveled recently? She went to see the kidney doctor and there is nothing wrong with her kidneys , but her feet and legs are swelled over her shoes

## 2019-10-09 NOTE — Telephone Encounter (Signed)
I spoke with patient and spouse.They just spoke with nephrologist and were told top go the ED. They state they will go either tonight or tomorrow. He also said nephrologist had already increased her lasix to 40 mg bib.

## 2019-10-10 ENCOUNTER — Emergency Department (HOSPITAL_COMMUNITY)
Admission: EM | Admit: 2019-10-10 | Discharge: 2019-10-10 | Disposition: A | Discharge status: Home / Self Care | Payer: Medicare Other | Attending: Emergency Medicine | Admitting: Emergency Medicine

## 2019-10-10 ENCOUNTER — Encounter (HOSPITAL_COMMUNITY): Payer: Self-pay | Admitting: Emergency Medicine

## 2019-10-10 ENCOUNTER — Emergency Department (HOSPITAL_COMMUNITY): Payer: Medicare Other

## 2019-10-10 ENCOUNTER — Other Ambulatory Visit: Payer: Self-pay

## 2019-10-10 DIAGNOSIS — R0602 Shortness of breath: Secondary | ICD-10-CM | POA: Diagnosis not present

## 2019-10-10 DIAGNOSIS — Z7951 Long term (current) use of inhaled steroids: Secondary | ICD-10-CM | POA: Diagnosis not present

## 2019-10-10 DIAGNOSIS — Z87891 Personal history of nicotine dependence: Secondary | ICD-10-CM | POA: Insufficient documentation

## 2019-10-10 DIAGNOSIS — Z79899 Other long term (current) drug therapy: Secondary | ICD-10-CM | POA: Diagnosis not present

## 2019-10-10 DIAGNOSIS — J45909 Unspecified asthma, uncomplicated: Secondary | ICD-10-CM | POA: Diagnosis not present

## 2019-10-10 DIAGNOSIS — I4891 Unspecified atrial fibrillation: Secondary | ICD-10-CM | POA: Diagnosis not present

## 2019-10-10 DIAGNOSIS — J449 Chronic obstructive pulmonary disease, unspecified: Secondary | ICD-10-CM | POA: Diagnosis not present

## 2019-10-10 DIAGNOSIS — I5023 Acute on chronic systolic (congestive) heart failure: Secondary | ICD-10-CM | POA: Diagnosis not present

## 2019-10-10 DIAGNOSIS — R600 Localized edema: Secondary | ICD-10-CM | POA: Insufficient documentation

## 2019-10-10 DIAGNOSIS — Z86010 Personal history of colonic polyps: Secondary | ICD-10-CM | POA: Diagnosis not present

## 2019-10-10 DIAGNOSIS — I509 Heart failure, unspecified: Secondary | ICD-10-CM

## 2019-10-10 DIAGNOSIS — I10 Essential (primary) hypertension: Secondary | ICD-10-CM | POA: Diagnosis not present

## 2019-10-10 DIAGNOSIS — I110 Hypertensive heart disease with heart failure: Secondary | ICD-10-CM | POA: Diagnosis not present

## 2019-10-10 LAB — CBC WITH DIFFERENTIAL/PLATELET
Abs Immature Granulocytes: 0.01 10*3/uL (ref 0.00–0.07)
Basophils Absolute: 0 10*3/uL (ref 0.0–0.1)
Basophils Relative: 1 %
Eosinophils Absolute: 0.2 10*3/uL (ref 0.0–0.5)
Eosinophils Relative: 4 %
HCT: 26 % — ABNORMAL LOW (ref 36.0–46.0)
Hemoglobin: 7.9 g/dL — ABNORMAL LOW (ref 12.0–15.0)
Immature Granulocytes: 0 %
Lymphocytes Relative: 18 %
Lymphs Abs: 1 10*3/uL (ref 0.7–4.0)
MCH: 25.2 pg — ABNORMAL LOW (ref 26.0–34.0)
MCHC: 30.4 g/dL (ref 30.0–36.0)
MCV: 83.1 fL (ref 80.0–100.0)
Monocytes Absolute: 0.4 10*3/uL (ref 0.1–1.0)
Monocytes Relative: 8 %
Neutro Abs: 3.6 10*3/uL (ref 1.7–7.7)
Neutrophils Relative %: 69 %
Platelets: 223 10*3/uL (ref 150–400)
RBC: 3.13 MIL/uL — ABNORMAL LOW (ref 3.87–5.11)
RDW: 17 % — ABNORMAL HIGH (ref 11.5–15.5)
WBC: 5.2 10*3/uL (ref 4.0–10.5)
nRBC: 0 % (ref 0.0–0.2)

## 2019-10-10 LAB — BASIC METABOLIC PANEL
Anion gap: 10 (ref 5–15)
BUN: 27 mg/dL — ABNORMAL HIGH (ref 8–23)
CO2: 25 mmol/L (ref 22–32)
Calcium: 8.6 mg/dL — ABNORMAL LOW (ref 8.9–10.3)
Chloride: 99 mmol/L (ref 98–111)
Creatinine, Ser: 2.58 mg/dL — ABNORMAL HIGH (ref 0.44–1.00)
GFR calc Af Amer: 20 mL/min — ABNORMAL LOW (ref >60)
GFR calc non Af Amer: 17 mL/min — ABNORMAL LOW (ref >60)
Glucose, Bld: 92 mg/dL (ref 70–99)
Potassium: 3.5 mmol/L (ref 3.5–5.1)
Sodium: 134 mmol/L — ABNORMAL LOW (ref 135–145)

## 2019-10-10 LAB — TROPONIN I (HIGH SENSITIVITY): Troponin I (High Sensitivity): 13 ng/L (ref <18)

## 2019-10-10 LAB — BRAIN NATRIURETIC PEPTIDE: B Natriuretic Peptide: 1768 pg/mL — ABNORMAL HIGH (ref 0.0–100.0)

## 2019-10-10 MED ORDER — METOLAZONE 2.5 MG PO TABS: 2.5000 mg | ORAL_TABLET | Freq: Every day | ORAL | 0 refills | Status: DC

## 2019-10-10 MED ORDER — METOLAZONE 2.5 MG PO TABS
2.5000 mg | ORAL_TABLET | Freq: Once | ORAL | Status: AC
  Administered 2019-10-10: 2.5 mg via ORAL
  Filled 2019-10-10: qty 1

## 2019-10-10 MED ORDER — FUROSEMIDE 10 MG/ML IJ SOLN
40.0000 mg | INTRAMUSCULAR | Status: AC
  Administered 2019-10-10: 40 mg via INTRAVENOUS
  Filled 2019-10-10: qty 4

## 2019-10-10 MED ORDER — TORSEMIDE 20 MG PO TABS: 20.0000 mg | ORAL_TABLET | Freq: Two times a day (BID) | ORAL | 0 refills | Status: DC

## 2019-10-10 MED ORDER — TORSEMIDE 20 MG PO TABS
20.0000 mg | ORAL_TABLET | Freq: Every day | ORAL | Status: DC
  Administered 2019-10-10: 20 mg via ORAL
  Filled 2019-10-10 (×3): qty 1

## 2019-10-10 NOTE — ED Notes (Signed)
Pt able to ambulated to bathroom without difficulty

## 2019-10-10 NOTE — ED Provider Notes (Signed)
[place] Provider Note   CSN: [id number] Arrival date & time: 10/10/19  6301     History Chief Complaint  Patient presents with  . Shortness of Breath    Bailey Bishop is a 79 y.o. female.  HPI   This patient is an 79 year old female, known history of atrial fibrillation, she also has a history of congestive heart failure, she has been followed by cardiology very closely and in fact had been recently admitted to the hospital several weeks ago with what appeared to be a congestive heart failure exacerbation when she had some increased swelling and shortness of breath.  She reports today that over the last few days she has had increasing swelling in her legs and is now becoming a little more short of breath.  She cannot walk because of the severe swelling and discomfort in her feet.  Her weight today is about 152-1/2, this is up from her baseline which is about 6 to 8 pounds lower than that.  She has been taking Lasix 20 mg twice a day but unfortunately has struggled with chronic renal insufficiency which seems to be getting worse.  Her last creatinine was measured 2 days ago at 2.4, she is being followed by nephrology most recently as she establish care with them and is followed by the cardiology service as well.  The patient denies fevers or chills nausea or vomiting or diarrhea.  Appetite has been okay  Past Medical History:  Diagnosis Date  . Anxiety   . Arthritis   . Asthma   . Atrial fibrillation ([place])    a. s/p unsuccessful DCCV in 01/2017 and intolerant to Amiodarone --> Rate-control strategy pursued since b. failed Tikosyn in 11/2017  . Breast cancer ([place]) 2001   Right  . Essential hypertension   . GERD (gastroesophageal reflux disease)   . T10 vertebral fracture [facility])     Patient Active Problem List   Diagnosis Date Noted  . Acute on chronic diastolic HF (heart failure) ([place]) 09/18/2019  . GI bleed 09/05/2019  . Symptomatic anemia  09/03/2019  . CKD (chronic kidney disease), stage IIIb 09/03/2019  . Iron deficiency anemia   . Benign neoplasm of transverse colon   . Diverticulosis of large intestine without diverticulitis   . Visit for monitoring Tikosyn therapy 12/02/2017  . Atrial fibrillation ([place]) 03/31/2017  . Persistent atrial fibrillation ([place])   . Anticoagulated 10/03/2016  . COPD (chronic obstructive pulmonary disease) ([place]) 10/03/2016  . Hypokalemia 09/24/2016  . Asthma in adult without complication 60/12/9321  . Hyperglycemia 09/23/2016  . Anxiety   . GERD (gastroesophageal reflux disease)   . Shortness of breath   . Essential hypertension   . Status asthmaticus 07/07/2014    Past Surgical History:  Procedure Laterality Date  . BIOPSY N/A 04/22/2018   Procedure: BIOPSY;  Surgeon: Aviva Signs, MD;  [facility];  Service: Gastroenterology;  Laterality: N/A;  . BIOPSY  09/06/2019   Procedure: BIOPSY;  Surgeon: Daneil Dolin, MD;  [facility];  Service: Endoscopy;;  gastric  . CARDIOVERSION N/A 02/18/2017   Procedure: CARDIOVERSION;  Surgeon: Josue Hector, MD;  Location: [facility] ENDOSCOPY;  Service: Cardiovascular;  Laterality: N/A;  . CARDIOVERSION N/A 12/04/2017   Procedure: CARDIOVERSION;  Surgeon: [name], MD;  Location: [place];  Service: Cardiovascular;  Laterality: N/A;  . CATARACT EXTRACTION W/PHACO  07/23/2011   Procedure: CATARACT EXTRACTION PHACO AND INTRAOCULAR LENS PLACEMENT ([place]);  Surgeon: Williams Che,  MD;  Location: [facility] ORS;  Service: Ophthalmology;  Laterality: Right;  CDE:9.96  . CATARACT EXTRACTION W/PHACO Left 11/13/2018   Procedure: CATARACT EXTRACTION PHACO AND INTRAOCULAR LENS PLACEMENT (IOC);  Surgeon: Baruch Goldmann, MD;  Location: [facility];  Service: Ophthalmology;  Laterality: Left;  left, CDE: 7.44  . CHOLECYSTECTOMY N/A 02/14/2015   Procedure: LAPAROSCOPIC CHOLECYSTECTOMY;  Surgeon: Aviva Signs, MD;  Location: [facility] OR[facility];  Service: General;   Laterality: N/A;  . COLONOSCOPY N/A 04/22/2018   Procedure: COLONOSCOPY;  Surgeon: Aviva Signs, MD;  [facility];  Service: Gastroenterology;  Laterality: N/A;  . COLONOSCOPY WITH PROPOFOL N/A 09/06/2019   Procedure: COLONOSCOPY WITH PROPOFOL;  Surgeon: Daneil Dolin, MD;  [facility];  Service: Endoscopy;  Laterality: N/A;  . ESOPHAGEAL DILATION  09/06/2019   Procedure: ESOPHAGEAL DILATION;  Surgeon: Daneil Dolin, MD;  [facility];  Service: Endoscopy;;  . ESOPHAGOGASTRODUODENOSCOPY N/A 04/22/2018   Procedure: ESOPHAGOGASTRODUODENOSCOPY (EGD);  Surgeon: Aviva Signs, MD;  [facility];  Service: Gastroenterology;  Laterality: N/A;  . ESOPHAGOGASTRODUODENOSCOPY (EGD) WITH PROPOFOL N/A 09/06/2019   Procedure: ESOPHAGOGASTRODUODENOSCOPY (EGD) WITH PROPOFOL;  Surgeon: Daneil Dolin, MD;  [facility];  Service: Endoscopy;  Laterality: N/A;  . MASTECTOMY     bilateral mastectomy-right breast cancer-left taken by choice  . POLYPECTOMY  04/22/2018   Procedure: POLYPECTOMY;  Surgeon: Aviva Signs, MD;  [facility];  Service: Gastroenterology;;  . POLYPECTOMY  09/06/2019   Procedure: POLYPECTOMY;  Surgeon: Daneil Dolin, MD;  [facility];  Service: Endoscopy;;  cecal, ascending, hepatic flexure, sigmoid  . SEPTOPLASTY  1995     OB History   No obstetric history on file.     Family History  Problem Relation Age of Onset  . Cancer Mother   . Aneurysm Father   . Cancer Sister   . Cancer Sister   . Anesthesia problems Neg Hx   . Hypotension Neg Hx   . Malignant hyperthermia Neg Hx   . Pseudochol deficiency Neg Hx   . Colon cancer Neg Hx     Social History   Tobacco Use  . Smoking status: Former Smoker    Packs/day: 1.00    Years: 28.00    Pack years: 28.00    Types: Cigarettes    Quit date: 07/17/1983    Years since quitting: 36.2  . Smokeless tobacco: Never Used  Vaping Use  . Vaping Use:  Never used  Substance Use Topics  . Alcohol use: No  . Drug use: No    Home Medications Prior to Admission medications   Medication Sig Start Date End Date Taking? Authorizing Provider  acetaminophen (TYLENOL) 650 MG CR tablet Take 650 mg by mouth every 8 (eight) hours as needed for pain.    [provider]  albuterol (VENTOLIN HFA) 108 (90 Base) MCG/ACT inhaler Inhale 1-2 puffs into the lungs every 6 (six) hours as needed for wheezing or shortness of breath.    [provider]  ALPRAZolam Duanne Moron) 0.5 MG tablet Take 0.5 mg by mouth at bedtime as needed for anxiety.  06/18/14   [provider]  apixaban (ELIQUIS) 2.5 MG TABS tablet Take 1 tablet (2.5 mg total) by mouth 2 (two) times daily. 09/24/19   Roxan Hockey, MD  bisoprolol (ZEBETA) 5 MG tablet Take 0.5 tablets (2.5 mg total) by mouth daily. 09/24/19   Roxan Hockey, MD  calcium citrate-vitamin D (CITRACAL+D) 315-200 MG-UNIT tablet Take  1 tablet by mouth 2 (two) times daily.    [provider]  fluticasone (FLONASE) 50 MCG/ACT nasal spray Place 1 spray into both nostrils daily as needed for allergies.  12/26/18   [provider]  furosemide (LASIX) 20 MG tablet Take 1 tablet (20 mg total) by mouth daily. Decrease Lasix to 20 mg daily starting on 09/28/2019, do not take Lasix until 09/28/2019 09/28/19   Roxan Hockey, MD  omeprazole (PRILOSEC) 40 MG capsule Take 40 mg by mouth as needed.    [provider]  potassium chloride SA (KLOR-CON) 20 MEQ tablet Take 1 tablet (20 mEq total) by mouth daily. Start on 09/28/2019 when you restart Lasix 09/28/19   Roxan Hockey, MD  traZODone (DESYREL) 50 MG tablet Take 1 tablet (50 mg total) by mouth at bedtime as needed for sleep. 09/24/19   Roxan Hockey, MD    Allergies    Cardizem cd [diltiazem hcl er beads], Amiodarone, Metoprolol tartrate, Sulfa antibiotics, Tomato, Carvedilol, and Chocolate  Review of Systems   Review of Systems  All other  systems reviewed and are negative.   Physical Exam Updated Vital Signs BP (!) 113/96 (BP Location: Right Arm)   Pulse 85   Temp 97.8 F (36.6 C) (Oral)   Resp (!) 22   Ht 1.626 m (5\' 4" )   Wt 68.9 kg   SpO2 97%   BMI 26.09 kg/m   Physical Exam Vitals and nursing note reviewed.  Constitutional:      General: She is not in acute distress.    Appearance: She is well-developed.  HENT:     Head: Normocephalic and atraumatic.     Mouth/Throat:     Pharynx: No oropharyngeal exudate.  Eyes:     General: No scleral icterus.       Right eye: No discharge.        Left eye: No discharge.     Conjunctiva/sclera: Conjunctivae normal.     Pupils: Pupils are equal, round, and reactive to light.  Neck:     Thyroid: No thyromegaly.     Vascular: No JVD.  Cardiovascular:     Rate and Rhythm: Normal rate and regular rhythm.     Heart sounds: Normal heart sounds. No murmur heard.  No friction rub. No gallop.   Pulmonary:     Effort: Pulmonary effort is normal. No respiratory distress.     Breath sounds: Wheezing present. No rales.  Abdominal:     General: Bowel sounds are normal. There is no distension.     Palpations: Abdomen is soft. There is no mass.     Tenderness: There is no abdominal tenderness.  Musculoskeletal:        General: No tenderness. Normal range of motion.     Cervical back: Normal range of motion and neck supple.     Right lower leg: Edema present.     Left lower leg: Edema present.  Lymphadenopathy:     Cervical: No cervical adenopathy.  Skin:    General: Skin is warm and dry.     Findings: No erythema or rash.  Neurological:     Mental Status: She is alert.     Coordination: Coordination normal.  Psychiatric:        Behavior: Behavior normal.     ED Results / Procedures / Treatments   Labs (all labs ordered are listed, but only abnormal results are displayed) Labs Reviewed - No data to display  EKG None  Radiology DG  Chest 2 View  Result Date:  10/09/2019 CLINICAL DATA:  Shortness of breath, peripheral edema, chronic kidney disease EXAM: CHEST - 2 VIEW COMPARISON:  09/23/2019 FINDINGS: Stable cardiomegaly with mild vascular congestion. No significant developing edema pattern. Similar chronic perihilar and lower lobe bandlike opacities compatible with atelectasis and or scarring. No significant interval change in aeration. No definite superimposed pneumonia, significant collapse or consolidation. Small pleural effusions remain. Aorta atherosclerotic. Trachea is midline. Chronic appearing compression fractures of the lower thoracic spine. IMPRESSION: Stable chronic chest findings with cardiomegaly, vascular congestion, and similar pattern of bilateral atelectasis/scarring. Persistent small pleural effusions No new process by plain radiography Aortic Atherosclerosis (ICD10-I70.0). Electronically Signed   By: Jerilynn Mages.  Shick M.D.   On: 10/09/2019 15:12   US RENAL  Result Date: 10/09/2019 CLINICAL DATA:  Chronic kidney disease stage 4, hypertension, previous smoker EXAM: RENAL / URINARY TRACT ULTRASOUND COMPLETE COMPARISON:  04/13/2019 FINDINGS: Right Kidney: Renal measurements: 8.4 x 4.2 x 3.6 cm = volume: 66 mL . Echogenicity within normal limits. No mass or hydronephrosis visualized. Left Kidney: Renal measurements: 9.0 x 5.1 x 3.4 cm = volume: 80 mL. Left kidney lower pole anechoic cyst measures 3.6 x 2.9 x 3.4 cm. This correlates with the comparison CT. Bladder: Appears normal for degree of bladder distention. Other: None. IMPRESSION: No acute renal abnormality by ultrasound. Negative for hydronephrosis. 3.6 cm left kidney lower pole cyst. Electronically Signed   By: Jerilynn Mages.  Shick M.D.   On: 10/09/2019 15:15    Procedures Procedures (including critical care time)  Medications Ordered in ED Medications - No data to display  ED Course  I have reviewed the triage vital signs and the nursing notes.  Pertinent labs & imaging results that were available  during my care of the patient were reviewed by me and considered in my medical decision making (see chart for details).    MDM Rules/Calculators/A&P                          This patient has had a very thorough work-up in the past and continues to have decompensated congestive heart failure with increased swelling and edema.  She will need Lasix, her oxygen level is 97% on room air, she is tachypneic with mild wheezing but states that she always has that in the morning.  She is not a smoker and I suspect this is a cardiac wheeze.  I will obtain a chest x-ray as well as a repeat BNP, given her renal dysfunction and the need for diuretics we will check the renal dysfunction as well.  She is agreeable to the plan, she may need admission to the hospital.  The legs are symmetrical, I doubt DVT, she is already on Eliquis for the history of A. fib.  Unlikely to be cellulitis without fever or significant redness, there does appear to be an element of erythema to the bilateral feet likely secondary to stasis dermatitis and her chronic edema  I discussed the case with Dr. Golden Hurter who recommends consultation with nephrology regarding medication management.  I discussed the case with Dr. Posey Pronto on the nephrology service on call who recommends that the patient get 2 days of Zaroxolyn 2.5 mg/day, switch to torsemide 20 mg twice a day instead of furosemide and to have labs repeated in 1 week.  Chest x-ray shows no signs of pulmonary edema, kidney function seems to be at baseline if not slightly slipping with a creatinine of 2.58,  the patient is chronically anemic but no different from baseline.  Albumin was normal on metabolic panel checked 2 days ago.  The patient has ambulated back and forth to the bathroom, feels great, the husband states he feels like she looks much better and is very happy with the care.  They have been informed of the exact treatment plan including changing over to torsemide and  stopping furosemide as well as taking an additional day of Zaroxolyn.  At this time the patient is very stable appearing, oxygen of 98 to 99% on room air, swelling improved  Final Clinical Impression(s) / ED Diagnoses Final diagnoses:  Acute on chronic congestive heart failure, unspecified heart failure type ([place])    Rx / DC Orders ED Discharge Orders         Ordered    metolazone (ZAROXOLYN) 2.5 MG tablet  Daily     Discontinue  Reprint     10/10/19 1457    torsemide (DEMADEX) 20 MG tablet  2 times daily     Discontinue  Reprint     10/10/19 1457           Noemi Chapel, MD 10/10/19 1459

## 2019-10-10 NOTE — ED Triage Notes (Signed)
Patient c/o shortness of breath with swelling in feet bilaterally x2 weeks. Per patient seen nephrologist yesterday and had blood work but has not gotten results back yet. Per patient seen and admitted twice for same thing. Per patient she takes Lasix 20mg  BID. Denies diagnosis of CHF but does have hx of Afib. Denies any chest pain.

## 2019-10-10 NOTE — Discharge Instructions (Signed)
Please take the following medications exactly as prescribed  Metolazone, 1 tablet tomorrow, this should only be taken 1 time  Torsemide, 20 mg by mouth twice a day, this medication will replace furosemide which you had been taking.  Please make sure that you are not taking both torsemide and furosemide but only torsemide.  You should follow-up with your kidney doctor within 1 week for a recheck of your blood work.  Please avoid eating fast food or preprepared meals that are high in sodium, eat a low-salt diet.  You may return at anytime to the emergency department for any severe or worsening symptoms or any other concerns

## 2019-10-12 LAB — HEPATITIS C GENOTYPE

## 2019-10-13 LAB — MISC LABCORP TEST (SEND OUT): LabCorp test name: 141330

## 2019-10-16 DIAGNOSIS — D508 Other iron deficiency anemias: Secondary | ICD-10-CM | POA: Diagnosis not present

## 2019-10-16 DIAGNOSIS — I5022 Chronic systolic (congestive) heart failure: Secondary | ICD-10-CM | POA: Diagnosis not present

## 2019-10-16 DIAGNOSIS — N170 Acute kidney failure with tubular necrosis: Secondary | ICD-10-CM | POA: Diagnosis not present

## 2019-10-16 DIAGNOSIS — E790 Hyperuricemia without signs of inflammatory arthritis and tophaceous disease: Secondary | ICD-10-CM | POA: Diagnosis not present

## 2019-10-16 DIAGNOSIS — E871 Hypo-osmolality and hyponatremia: Secondary | ICD-10-CM | POA: Diagnosis not present

## 2019-10-19 ENCOUNTER — Telehealth (HOSPITAL_COMMUNITY): Payer: Self-pay | Admitting: *Deleted

## 2019-10-19 NOTE — Telephone Encounter (Signed)
pts husband called stating pt is very short of breath and he doesn't think she will "make it" until her appointment on 8/9. Husband said he has to tap pt on the back to make her breathe. Husband thinks patient needs to be admitted to the hospital. He stated he would take pt to the hospital and call if she doesn't get admitted.

## 2019-10-22 ENCOUNTER — Ambulatory Visit
Admission: EM | Admit: 2019-10-22 | Discharge: 2019-10-22 | Disposition: A | Discharge status: Home / Self Care | Payer: Medicare Other | Attending: Emergency Medicine | Admitting: Emergency Medicine

## 2019-10-22 ENCOUNTER — Other Ambulatory Visit: Payer: Self-pay

## 2019-10-22 DIAGNOSIS — H938X3 Other specified disorders of ear, bilateral: Secondary | ICD-10-CM

## 2019-10-22 DIAGNOSIS — J029 Acute pharyngitis, unspecified: Secondary | ICD-10-CM

## 2019-10-22 DIAGNOSIS — R0981 Nasal congestion: Secondary | ICD-10-CM | POA: Diagnosis not present

## 2019-10-22 MED ORDER — FLUTICASONE PROPIONATE 50 MCG/ACT NA SUSP
2.0000 | Freq: Every day | NASAL | 0 refills | Status: DC
Start: 2019-10-22 — End: 2021-01-31

## 2019-10-22 MED ORDER — CETIRIZINE HCL 1 MG/ML PO SOLN
5.0000 mg | Freq: Every day | ORAL | 0 refills | Status: DC
Start: 2019-10-22 — End: 2019-12-25

## 2019-10-22 NOTE — ED Provider Notes (Signed)
[place]   [id number] 10/22/19 Arrival Time: 0903   CC: sore throat  SUBJECTIVE: History from: patient.  Bailey Bishop is a 79 y.o. female who presents with runny nose, congestion, and sore throat x 1 day.  Symptoms began after sitting in front of an [name] unit.  Has tried tylenol with relief.  Symptoms are made worse with swallowing.  Reports previous symptoms in the past with sinus infection.   Denies fever, chills, fatigue, cough, SOB, wheezing, chest pain, nausea, changes in bowel or bladder habits.    ROS: As per HPI.  All other pertinent ROS negative.     Past Medical History:  Diagnosis Date  . Anxiety   . Arthritis   . Asthma   . Atrial fibrillation ([place])    a. s/p unsuccessful DCCV in 01/2017 and intolerant to Amiodarone --> Rate-control strategy pursued since b. failed Tikosyn in 11/2017  . Breast cancer ([place]) 2001   Right  . Essential hypertension   . GERD (gastroesophageal reflux disease)   . T10 vertebral fracture [facility])    Past Surgical History:  Procedure Laterality Date  . BIOPSY N/A 04/22/2018   Procedure: BIOPSY;  Surgeon: Aviva Signs, MD;  [facility];  Service: Gastroenterology;  Laterality: N/A;  . BIOPSY  09/06/2019   Procedure: BIOPSY;  Surgeon: Daneil Dolin, MD;  [facility];  Service: Endoscopy;;  gastric  . CARDIOVERSION N/A 02/18/2017   Procedure: CARDIOVERSION;  Surgeon: Josue Hector, MD;  Location: [facility] ENDOSCOPY;  Service: Cardiovascular;  Laterality: N/A;  . CARDIOVERSION N/A 12/04/2017   Procedure: CARDIOVERSION;  Surgeon: [name], MD;  Location: [place];  Service: Cardiovascular;  Laterality: N/A;  . CATARACT EXTRACTION W/PHACO  07/23/2011   Procedure: CATARACT EXTRACTION PHACO AND INTRAOCULAR LENS PLACEMENT ([place]);  Surgeon: Williams Che, MD;  Location: [facility];  Service: Ophthalmology;  Laterality: Right;  CDE:9.96  . CATARACT EXTRACTION W/PHACO Left 11/13/2018   Procedure: CATARACT  EXTRACTION PHACO AND INTRAOCULAR LENS PLACEMENT ([facility]);  Surgeon: Baruch Goldmann, MD;  Location: [facility];  Service: Ophthalmology;  Laterality: Left;  left, CDE: 7.44  . CHOLECYSTECTOMY N/A 02/14/2015   Procedure: LAPAROSCOPIC CHOLECYSTECTOMY;  Surgeon: Aviva Signs, MD;  Location: [facility];  Service: General;  Laterality: N/A;  . COLONOSCOPY N/A 04/22/2018   Procedure: COLONOSCOPY;  Surgeon: Aviva Signs, MD;  [facility];  Service: Gastroenterology;  Laterality: N/A;  . COLONOSCOPY WITH PROPOFOL N/A 09/06/2019   Procedure: COLONOSCOPY WITH PROPOFOL;  Surgeon: Daneil Dolin, MD;  [facility];  Service: Endoscopy;  Laterality: N/A;  . ESOPHAGEAL DILATION  09/06/2019   Procedure: ESOPHAGEAL DILATION;  Surgeon: Daneil Dolin, MD;  [facility];  Service: Endoscopy;;  . ESOPHAGOGASTRODUODENOSCOPY N/A 04/22/2018   Procedure: ESOPHAGOGASTRODUODENOSCOPY (EGD);  Surgeon: Aviva Signs, MD;  [facility];  Service: Gastroenterology;  Laterality: N/A;  . ESOPHAGOGASTRODUODENOSCOPY (EGD) WITH PROPOFOL N/A 09/06/2019   Procedure: ESOPHAGOGASTRODUODENOSCOPY (EGD) WITH PROPOFOL;  Surgeon: Daneil Dolin, MD;  [facility];  Service: Endoscopy;  Laterality: N/A;  . MASTECTOMY     bilateral mastectomy-right breast cancer-left taken by choice  . POLYPECTOMY  04/22/2018   Procedure: POLYPECTOMY;  Surgeon: Aviva Signs, MD;  [facility];  Service: Gastroenterology;;  . POLYPECTOMY  09/06/2019   Procedure: POLYPECTOMY;  Surgeon: Daneil Dolin, MD;  [facility];  Service: Endoscopy;;  cecal, ascending, hepatic flexure, sigmoid  . SEPTOPLASTY  1995  Allergies  Allergen Reactions  . Cardizem Cd [Diltiazem Hcl Er Beads] Diarrhea  . Amiodarone Nausea And Vomiting  . Metoprolol Tartrate Diarrhea  . Sulfa Antibiotics Nausea And Vomiting  . Tomato Other (See Comments)    Burns stomach  . Carvedilol Diarrhea and Rash  . Chocolate Rash  and Other (See Comments)    Dark chocolate   No current facility-administered medications on file prior to encounter.   Current Outpatient Medications on File Prior to Encounter  Medication Sig Dispense Refill  . acetaminophen (TYLENOL) 650 MG CR tablet Take 650 mg by mouth every 8 (eight) hours as needed for pain.    Marland Kitchen albuterol (VENTOLIN HFA) 108 (90 Base) MCG/ACT inhaler Inhale 1-2 puffs into the lungs every 6 (six) hours as needed for wheezing or shortness of breath.    . ALPRAZolam (XANAX) 0.5 MG tablet Take 0.5 mg by mouth at bedtime as needed for anxiety.     Marland Kitchen apixaban (ELIQUIS) 2.5 MG TABS tablet Take 1 tablet (2.5 mg total) by mouth 2 (two) times daily. 60 tablet 3  . bisoprolol (ZEBETA) 5 MG tablet Take 0.5 tablets (2.5 mg total) by mouth daily. 15 tablet 3  . calcium citrate-vitamin D (CITRACAL+D) 315-200 MG-UNIT tablet Take 1 tablet by mouth 2 (two) times daily.    . furosemide (LASIX) 20 MG tablet Take 1 tablet (20 mg total) by mouth daily. Decrease Lasix to 20 mg daily starting on 09/28/2019, do not take Lasix until 09/28/2019 30 tablet 3  . metolazone (ZAROXOLYN) 2.5 MG tablet Take 1 tablet (2.5 mg total) by mouth daily for 1 day. 1 tablet 0  . metolazone (ZAROXOLYN) 2.5 MG tablet Take 1 tablet (2.5 mg total) by mouth daily. 30 tablet 0  . omeprazole (PRILOSEC) 40 MG capsule Take 40 mg by mouth as needed.    . potassium chloride SA (KLOR-CON) 20 MEQ tablet Take 1 tablet (20 mEq total) by mouth daily. Start on 09/28/2019 when you restart Lasix 90 tablet 3  . torsemide (DEMADEX) 20 MG tablet Take 1 tablet (20 mg total) by mouth 2 (two) times daily. 60 tablet 0  . torsemide (DEMADEX) 20 MG tablet Take 1 tablet (20 mg total) by mouth 2 (two) times daily. 60 tablet 0  . traZODone (DESYREL) 50 MG tablet Take 1 tablet (50 mg total) by mouth at bedtime as needed for sleep. 30 tablet 5   Social History   Socioeconomic History  . Marital status: Married    Spouse name: [name]  . Number of  children: Not on file  . Years of education: Not on file  . Highest education level: Not on file  Occupational History  . Not on file  Tobacco Use  . Smoking status: Former Smoker    Packs/day: 1.00    Years: 28.00    Pack years: 28.00    Types: Cigarettes    Quit date: 07/17/1983    Years since quitting: 36.2  . Smokeless tobacco: Never Used  Vaping Use  . Vaping Use: Never used  Substance and Sexual Activity  . Alcohol use: No  . Drug use: No  . Sexual activity: Not on file  Other Topics Concern  . Not on file  Social History Narrative  . Not on file   Social Determinants of Health   Financial Resource Strain:   . Difficulty of Paying Living Expenses:   Food Insecurity:   . Worried About Charity fundraiser in the Last Year:   .  Ran Out of Food in the Last Year:   Transportation Needs: No Transportation Needs  . Lack of Transportation (Medical): No  . Lack of Transportation (Non-Medical): No  Physical Activity:   . Days of Exercise per Week:   . Minutes of Exercise per Session:   Stress:   . Feeling of Stress :   Social Connections: Unknown  . Frequency of Communication with Friends and Family: Not on file  . Frequency of Social Gatherings with Friends and Family: Not on file  . Attends Religious Services: Not on file  . Active Member of Clubs or Organizations: Not on file  . Attends Archivist Meetings: Not on file  . Marital Status: Married  Human resources officer Violence:   . Fear of Current or Ex-Partner:   . Emotionally Abused:   Marland Kitchen Physically Abused:   . Sexually Abused:    Family History  Problem Relation Age of Onset  . Cancer Mother   . Aneurysm Father   . Cancer Sister   . Cancer Sister   . Anesthesia problems Neg Hx   . Hypotension Neg Hx   . Malignant hyperthermia Neg Hx   . Pseudochol deficiency Neg Hx   . Colon cancer Neg Hx     OBJECTIVE:  Vitals:   10/22/19 0934  BP: 112/65  Pulse: 84  Resp: 18  Temp: 98.3 F (36.8 C)    SpO2: 98%     General appearance: alert; well-appearing, nontoxic; speaking in full sentences and tolerating own secretions HEENT: NCAT; Ears: EACs clear, TMs pearly gray; Eyes: PERRL.  EOM grossly intact. Nose: nares patent without rhinorrhea, Throat: oropharynx erythematous, tonsils erythematous, but not enlarged, uvula midline  Neck: supple without LAD Lungs: unlabored respirations, symmetrical air entry; cough: absent; no respiratory distress; CTAB Heart: regular rate and rhythm.   Skin: warm and dry Psychological: alert and cooperative; normal mood and affect  ASSESSMENT & PLAN:  1. Sinus congestion   2. Ear pressure, bilateral   3. Sore throat     Meds ordered this encounter  Medications  . fluticasone (FLONASE) 50 MCG/ACT nasal spray    Sig: Place 2 sprays into both nostrils daily.    Dispense:  16 g    Refill:  0    Order Specific Question:   Supervising Provider    Answer:   Raylene Everts [[id number]]  . cetirizine HCl (ZYRTEC) 1 MG/ML solution    Sig: Take 5 mLs (5 mg total) by mouth daily.    Dispense:  60 mL    Refill:  0    Order Specific Question:   Supervising Provider    Answer:   Raylene Everts [[id number]]   Declines COVID test at this time Get plenty of rest and push fluids zyrtec for nasal congestion, runny nose, and/or sore throat flonase for nasal congestion and runny nose Use medications daily for symptom relief Use OTC medications like ibuprofen or tylenol as needed fever or pain Follow up with PCP as needed Return, call or go to the ED if you have any new or worsening symptoms such as fever, cough, shortness of breath, chest tightness, chest pain, turning blue, changes in mental status, etc...   Reviewed expectations re: course of current medical issues. Questions answered. Outlined signs and symptoms indicating need for more acute intervention. Patient verbalized understanding. After Visit Summary given.         Stacey Drain Roosevelt,  PA-C 10/22/19 [phone]

## 2019-10-22 NOTE — ED Triage Notes (Signed)
Pt has c/o sore throat with swallowing and ear fullness that began yesterday

## 2019-10-22 NOTE — Discharge Instructions (Signed)
Declines COVID test at this time Get plenty of rest and push fluids zyrtec for nasal congestion, runny nose, and/or sore throat flonase for nasal congestion and runny nose Use medications daily for symptom relief Use OTC medications like ibuprofen or tylenol as needed fever or pain Follow up with PCP as needed Return, call or go to the ED if you have any new or worsening symptoms such as fever, cough, shortness of breath, chest tightness, chest pain, turning blue, changes in mental status, etc..Marland Kitchen

## 2019-10-26 LAB — MISC LABCORP TEST (SEND OUT): Labcorp test code: 520180

## 2019-10-29 ENCOUNTER — Emergency Department (HOSPITAL_COMMUNITY): Payer: Medicare Other

## 2019-10-29 ENCOUNTER — Encounter (HOSPITAL_COMMUNITY): Payer: Self-pay

## 2019-10-29 ENCOUNTER — Inpatient Hospital Stay (HOSPITAL_COMMUNITY)
Admission: EM | Admit: 2019-10-29 | Discharge: 2019-11-02 | DRG: 377 | Disposition: A | Discharge status: Home / Self Care | Payer: Medicare Other | Attending: Family Medicine | Admitting: Family Medicine

## 2019-10-29 ENCOUNTER — Other Ambulatory Visit: Payer: Self-pay

## 2019-10-29 DIAGNOSIS — N183 Chronic kidney disease, stage 3 unspecified: Secondary | ICD-10-CM | POA: Diagnosis present

## 2019-10-29 DIAGNOSIS — I313 Pericardial effusion (noninflammatory): Secondary | ICD-10-CM | POA: Diagnosis not present

## 2019-10-29 DIAGNOSIS — I5043 Acute on chronic combined systolic (congestive) and diastolic (congestive) heart failure: Secondary | ICD-10-CM | POA: Diagnosis present

## 2019-10-29 DIAGNOSIS — Z9011 Acquired absence of right breast and nipple: Secondary | ICD-10-CM | POA: Diagnosis not present

## 2019-10-29 DIAGNOSIS — J90 Pleural effusion, not elsewhere classified: Secondary | ICD-10-CM | POA: Diagnosis not present

## 2019-10-29 DIAGNOSIS — F40240 Claustrophobia: Secondary | ICD-10-CM | POA: Diagnosis not present

## 2019-10-29 DIAGNOSIS — R1314 Dysphagia, pharyngoesophageal phase: Secondary | ICD-10-CM | POA: Diagnosis present

## 2019-10-29 DIAGNOSIS — J449 Chronic obstructive pulmonary disease, unspecified: Secondary | ICD-10-CM | POA: Diagnosis not present

## 2019-10-29 DIAGNOSIS — I10 Essential (primary) hypertension: Secondary | ICD-10-CM

## 2019-10-29 DIAGNOSIS — M1990 Unspecified osteoarthritis, unspecified site: Secondary | ICD-10-CM | POA: Diagnosis not present

## 2019-10-29 DIAGNOSIS — D631 Anemia in chronic kidney disease: Secondary | ICD-10-CM | POA: Diagnosis not present

## 2019-10-29 DIAGNOSIS — K222 Esophageal obstruction: Secondary | ICD-10-CM | POA: Diagnosis present

## 2019-10-29 DIAGNOSIS — D500 Iron deficiency anemia secondary to blood loss (chronic): Secondary | ICD-10-CM | POA: Diagnosis not present

## 2019-10-29 DIAGNOSIS — F419 Anxiety disorder, unspecified: Secondary | ICD-10-CM | POA: Diagnosis present

## 2019-10-29 DIAGNOSIS — K5730 Diverticulosis of large intestine without perforation or abscess without bleeding: Secondary | ICD-10-CM | POA: Diagnosis present

## 2019-10-29 DIAGNOSIS — Z66 Do not resuscitate: Secondary | ICD-10-CM | POA: Diagnosis not present

## 2019-10-29 DIAGNOSIS — D649 Anemia, unspecified: Secondary | ICD-10-CM | POA: Diagnosis not present

## 2019-10-29 DIAGNOSIS — I429 Cardiomyopathy, unspecified: Secondary | ICD-10-CM | POA: Diagnosis not present

## 2019-10-29 DIAGNOSIS — N179 Acute kidney failure, unspecified: Secondary | ICD-10-CM | POA: Diagnosis not present

## 2019-10-29 DIAGNOSIS — R0602 Shortness of breath: Secondary | ICD-10-CM | POA: Diagnosis not present

## 2019-10-29 DIAGNOSIS — N184 Chronic kidney disease, stage 4 (severe): Secondary | ICD-10-CM | POA: Diagnosis not present

## 2019-10-29 DIAGNOSIS — Z5309 Procedure and treatment not carried out because of other contraindication: Secondary | ICD-10-CM | POA: Diagnosis not present

## 2019-10-29 DIAGNOSIS — R509 Fever, unspecified: Secondary | ICD-10-CM | POA: Diagnosis present

## 2019-10-29 DIAGNOSIS — Z9049 Acquired absence of other specified parts of digestive tract: Secondary | ICD-10-CM | POA: Diagnosis not present

## 2019-10-29 DIAGNOSIS — Z91018 Allergy to other foods: Secondary | ICD-10-CM

## 2019-10-29 DIAGNOSIS — E8770 Fluid overload, unspecified: Secondary | ICD-10-CM | POA: Diagnosis not present

## 2019-10-29 DIAGNOSIS — E872 Acidosis: Secondary | ICD-10-CM | POA: Diagnosis not present

## 2019-10-29 DIAGNOSIS — D62 Acute posthemorrhagic anemia: Secondary | ICD-10-CM | POA: Diagnosis not present

## 2019-10-29 DIAGNOSIS — Z853 Personal history of malignant neoplasm of breast: Secondary | ICD-10-CM

## 2019-10-29 DIAGNOSIS — J438 Other emphysema: Secondary | ICD-10-CM | POA: Diagnosis not present

## 2019-10-29 DIAGNOSIS — G9341 Metabolic encephalopathy: Secondary | ICD-10-CM | POA: Diagnosis not present

## 2019-10-29 DIAGNOSIS — N1832 Chronic kidney disease, stage 3b: Secondary | ICD-10-CM | POA: Diagnosis present

## 2019-10-29 DIAGNOSIS — Z8249 Family history of ischemic heart disease and other diseases of the circulatory system: Secondary | ICD-10-CM

## 2019-10-29 DIAGNOSIS — A419 Sepsis, unspecified organism: Secondary | ICD-10-CM | POA: Diagnosis not present

## 2019-10-29 DIAGNOSIS — I4821 Permanent atrial fibrillation: Secondary | ICD-10-CM | POA: Diagnosis not present

## 2019-10-29 DIAGNOSIS — K922 Gastrointestinal hemorrhage, unspecified: Secondary | ICD-10-CM | POA: Diagnosis not present

## 2019-10-29 DIAGNOSIS — R0689 Other abnormalities of breathing: Secondary | ICD-10-CM | POA: Diagnosis not present

## 2019-10-29 DIAGNOSIS — Z20822 Contact with and (suspected) exposure to covid-19: Secondary | ICD-10-CM | POA: Diagnosis present

## 2019-10-29 DIAGNOSIS — Z882 Allergy status to sulfonamides status: Secondary | ICD-10-CM | POA: Diagnosis not present

## 2019-10-29 DIAGNOSIS — Z87891 Personal history of nicotine dependence: Secondary | ICD-10-CM

## 2019-10-29 DIAGNOSIS — I499 Cardiac arrhythmia, unspecified: Secondary | ICD-10-CM | POA: Diagnosis not present

## 2019-10-29 DIAGNOSIS — I130 Hypertensive heart and chronic kidney disease with heart failure and stage 1 through stage 4 chronic kidney disease, or unspecified chronic kidney disease: Secondary | ICD-10-CM | POA: Diagnosis not present

## 2019-10-29 DIAGNOSIS — R339 Retention of urine, unspecified: Secondary | ICD-10-CM | POA: Diagnosis not present

## 2019-10-29 DIAGNOSIS — R6881 Early satiety: Secondary | ICD-10-CM | POA: Diagnosis present

## 2019-10-29 DIAGNOSIS — Z888 Allergy status to other drugs, medicaments and biological substances status: Secondary | ICD-10-CM

## 2019-10-29 DIAGNOSIS — Z743 Need for continuous supervision: Secondary | ICD-10-CM | POA: Diagnosis not present

## 2019-10-29 DIAGNOSIS — Z7901 Long term (current) use of anticoagulants: Secondary | ICD-10-CM

## 2019-10-29 DIAGNOSIS — I5033 Acute on chronic diastolic (congestive) heart failure: Secondary | ICD-10-CM

## 2019-10-29 DIAGNOSIS — Z9013 Acquired absence of bilateral breasts and nipples: Secondary | ICD-10-CM | POA: Diagnosis not present

## 2019-10-29 DIAGNOSIS — D509 Iron deficiency anemia, unspecified: Secondary | ICD-10-CM | POA: Diagnosis present

## 2019-10-29 DIAGNOSIS — I517 Cardiomegaly: Secondary | ICD-10-CM | POA: Diagnosis not present

## 2019-10-29 DIAGNOSIS — Z79899 Other long term (current) drug therapy: Secondary | ICD-10-CM | POA: Diagnosis not present

## 2019-10-29 DIAGNOSIS — R410 Disorientation, unspecified: Secondary | ICD-10-CM | POA: Diagnosis not present

## 2019-10-29 DIAGNOSIS — K254 Chronic or unspecified gastric ulcer with hemorrhage: Secondary | ICD-10-CM | POA: Diagnosis not present

## 2019-10-29 DIAGNOSIS — K635 Polyp of colon: Secondary | ICD-10-CM | POA: Diagnosis not present

## 2019-10-29 DIAGNOSIS — I482 Chronic atrial fibrillation, unspecified: Secondary | ICD-10-CM | POA: Diagnosis not present

## 2019-10-29 DIAGNOSIS — I4891 Unspecified atrial fibrillation: Secondary | ICD-10-CM | POA: Diagnosis present

## 2019-10-29 DIAGNOSIS — K219 Gastro-esophageal reflux disease without esophagitis: Secondary | ICD-10-CM | POA: Diagnosis not present

## 2019-10-29 DIAGNOSIS — J45909 Unspecified asthma, uncomplicated: Secondary | ICD-10-CM | POA: Diagnosis not present

## 2019-10-29 DIAGNOSIS — Z86010 Personal history of colonic polyps: Secondary | ICD-10-CM | POA: Diagnosis not present

## 2019-10-29 DIAGNOSIS — K319 Disease of stomach and duodenum, unspecified: Secondary | ICD-10-CM | POA: Diagnosis present

## 2019-10-29 DIAGNOSIS — E876 Hypokalemia: Secondary | ICD-10-CM | POA: Diagnosis present

## 2019-10-29 DIAGNOSIS — Z531 Procedure and treatment not carried out because of patient's decision for reasons of belief and group pressure: Secondary | ICD-10-CM | POA: Diagnosis present

## 2019-10-29 DIAGNOSIS — J189 Pneumonia, unspecified organism: Secondary | ICD-10-CM | POA: Diagnosis not present

## 2019-10-29 DIAGNOSIS — R197 Diarrhea, unspecified: Secondary | ICD-10-CM | POA: Diagnosis not present

## 2019-10-29 DIAGNOSIS — I4819 Other persistent atrial fibrillation: Secondary | ICD-10-CM | POA: Diagnosis not present

## 2019-10-29 DIAGNOSIS — I5032 Chronic diastolic (congestive) heart failure: Secondary | ICD-10-CM | POA: Diagnosis not present

## 2019-10-29 DIAGNOSIS — R000 Tachycardia, unspecified: Secondary | ICD-10-CM | POA: Diagnosis not present

## 2019-10-29 LAB — PROCALCITONIN: Procalcitonin: <0.1 ng/mL

## 2019-10-29 LAB — CBC WITH DIFFERENTIAL/PLATELET
Abs Immature Granulocytes: 0.04 10*3/uL (ref 0.00–0.07)
Basophils Absolute: 0 10*3/uL (ref 0.0–0.1)
Basophils Relative: 0 %
Eosinophils Absolute: 0.1 10*3/uL (ref 0.0–0.5)
Eosinophils Relative: 1 %
HCT: 21.7 % — ABNORMAL LOW (ref 36.0–46.0)
Hemoglobin: 6.4 g/dL — CL (ref 12.0–15.0)
Immature Granulocytes: 0 %
Lymphocytes Relative: 5 %
Lymphs Abs: 0.5 10*3/uL — ABNORMAL LOW (ref 0.7–4.0)
MCH: 24.8 pg — ABNORMAL LOW (ref 26.0–34.0)
MCHC: 29.5 g/dL — ABNORMAL LOW (ref 30.0–36.0)
MCV: 84.1 fL (ref 80.0–100.0)
Monocytes Absolute: 0.6 10*3/uL (ref 0.1–1.0)
Monocytes Relative: 6 %
Neutro Abs: 8.6 10*3/uL — ABNORMAL HIGH (ref 1.7–7.7)
Neutrophils Relative %: 88 %
Platelets: 198 10*3/uL (ref 150–400)
RBC: 2.58 MIL/uL — ABNORMAL LOW (ref 3.87–5.11)
RDW: 19.3 % — ABNORMAL HIGH (ref 11.5–15.5)
WBC: 9.8 10*3/uL (ref 4.0–10.5)
nRBC: 0 % (ref 0.0–0.2)

## 2019-10-29 LAB — POC OCCULT BLOOD, ED: Fecal Occult Bld: POSITIVE — AB

## 2019-10-29 LAB — URINALYSIS, ROUTINE W REFLEX MICROSCOPIC
Bilirubin Urine: NEGATIVE
Glucose, UA: NEGATIVE mg/dL
Hgb urine dipstick: NEGATIVE
Ketones, ur: NEGATIVE mg/dL
Leukocytes,Ua: NEGATIVE
Nitrite: NEGATIVE
Protein, ur: 30 mg/dL — AB
Specific Gravity, Urine: 1.012 (ref 1.005–1.030)
pH: 5 (ref 5.0–8.0)

## 2019-10-29 LAB — COMPREHENSIVE METABOLIC PANEL
ALT: 9 U/L (ref 0–44)
AST: 24 U/L (ref 15–41)
Albumin: 3.5 g/dL (ref 3.5–5.0)
Alkaline Phosphatase: 89 U/L (ref 38–126)
Anion gap: 16 — ABNORMAL HIGH (ref 5–15)
BUN: 27 mg/dL — ABNORMAL HIGH (ref 8–23)
CO2: 24 mmol/L (ref 22–32)
Calcium: 8.5 mg/dL — ABNORMAL LOW (ref 8.9–10.3)
Chloride: 97 mmol/L — ABNORMAL LOW (ref 98–111)
Creatinine, Ser: 2.8 mg/dL — ABNORMAL HIGH (ref 0.44–1.00)
GFR calc Af Amer: 18 mL/min — ABNORMAL LOW (ref >60)
GFR calc non Af Amer: 15 mL/min — ABNORMAL LOW (ref >60)
Glucose, Bld: 84 mg/dL (ref 70–99)
Potassium: 3 mmol/L — ABNORMAL LOW (ref 3.5–5.1)
Sodium: 137 mmol/L (ref 135–145)
Total Bilirubin: 1.8 mg/dL — ABNORMAL HIGH (ref 0.3–1.2)
Total Protein: 7.3 g/dL (ref 6.5–8.1)

## 2019-10-29 LAB — TROPONIN I (HIGH SENSITIVITY)
Troponin I (High Sensitivity): 18 ng/L — ABNORMAL HIGH (ref <18)
Troponin I (High Sensitivity): 25 ng/L — ABNORMAL HIGH (ref <18)

## 2019-10-29 LAB — D-DIMER, QUANTITATIVE: D-Dimer, Quant: 2.7 ug/mL-FEU — ABNORMAL HIGH (ref 0.00–0.50)

## 2019-10-29 LAB — TRIGLYCERIDES: Triglycerides: 40 mg/dL (ref <150)

## 2019-10-29 LAB — LACTIC ACID, PLASMA
Lactic Acid, Venous: 3.7 mmol/L (ref 0.5–1.9)
Lactic Acid, Venous: 2.7 mmol/L (ref 0.5–1.9)

## 2019-10-29 LAB — SARS CORONAVIRUS 2 BY RT PCR (HOSPITAL ORDER, PERFORMED IN ~~LOC~~ HOSPITAL LAB): SARS Coronavirus 2: NEGATIVE

## 2019-10-29 LAB — LACTATE DEHYDROGENASE: LDH: 161 U/L (ref 98–192)

## 2019-10-29 LAB — BRAIN NATRIURETIC PEPTIDE: B Natriuretic Peptide: 1507 pg/mL — ABNORMAL HIGH (ref 0.0–100.0)

## 2019-10-29 LAB — FIBRINOGEN: Fibrinogen: 206 mg/dL — ABNORMAL LOW (ref 210–475)

## 2019-10-29 LAB — FERRITIN: Ferritin: 9 ng/mL — ABNORMAL LOW (ref 11–307)

## 2019-10-29 LAB — C-REACTIVE PROTEIN: CRP: 1.1 mg/dL — ABNORMAL HIGH (ref <1.0)

## 2019-10-29 MED ORDER — SODIUM CHLORIDE 0.9 % IV SOLN
10.0000 mL/h | Freq: Once | INTRAVENOUS | Status: AC
  Administered 2019-10-30: 10 mL/h via INTRAVENOUS

## 2019-10-29 MED ORDER — BISOPROLOL FUMARATE 5 MG PO TABS
2.5000 mg | ORAL_TABLET | Freq: Every day | ORAL | Status: DC
  Administered 2019-10-30 – 2019-11-02 (×5): 2.5 mg via ORAL
  Filled 2019-10-29: qty 0.5
  Filled 2019-10-29 (×2): qty 1
  Filled 2019-10-29: qty 0.5
  Filled 2019-10-29 (×3): qty 1

## 2019-10-29 MED ORDER — LACTATED RINGERS IV BOLUS (SEPSIS)
1000.0000 mL | Freq: Once | INTRAVENOUS | Status: AC
  Administered 2019-10-29: 1000 mL via INTRAVENOUS

## 2019-10-29 MED ORDER — DIGOXIN 0.25 MG/ML IJ SOLN
0.2500 mg | Freq: Once | INTRAMUSCULAR | Status: AC
  Administered 2019-10-30: 0.25 mg via INTRAVENOUS
  Filled 2019-10-29: qty 2

## 2019-10-29 MED ORDER — METRONIDAZOLE IN NACL 5-0.79 MG/ML-% IV SOLN
500.0000 mg | Freq: Once | INTRAVENOUS | Status: AC
  Administered 2019-10-29: 500 mg via INTRAVENOUS
  Filled 2019-10-29: qty 100

## 2019-10-29 MED ORDER — POTASSIUM CHLORIDE CRYS ER 20 MEQ PO TBCR
40.0000 meq | EXTENDED_RELEASE_TABLET | Freq: Once | ORAL | Status: AC
  Administered 2019-10-30: 40 meq via ORAL
  Filled 2019-10-29: qty 2

## 2019-10-29 MED ORDER — VANCOMYCIN HCL IN DEXTROSE 1-5 GM/200ML-% IV SOLN
1000.0000 mg | Freq: Once | INTRAVENOUS | Status: AC
  Administered 2019-10-29: 1000 mg via INTRAVENOUS
  Filled 2019-10-29: qty 200

## 2019-10-29 MED ORDER — ONDANSETRON HCL 4 MG PO TABS: 4.0000 mg | ORAL_TABLET | Freq: Four times a day (QID) | ORAL | Status: DC | PRN

## 2019-10-29 MED ORDER — SODIUM CHLORIDE 0.9 % IV SOLN
80.0000 mg | Freq: Once | INTRAVENOUS | Status: AC
  Administered 2019-10-29: 80 mg via INTRAVENOUS
  Filled 2019-10-29: qty 80

## 2019-10-29 MED ORDER — METRONIDAZOLE IN NACL 5-0.79 MG/ML-% IV SOLN
500.0000 mg | Freq: Three times a day (TID) | INTRAVENOUS | Status: DC
  Administered 2019-10-30 (×2): 500 mg via INTRAVENOUS
  Filled 2019-10-29 (×2): qty 100

## 2019-10-29 MED ORDER — DARBEPOETIN ALFA 40 MCG/0.4ML IJ SOSY
40.0000 ug | PREFILLED_SYRINGE | Freq: Once | INTRAMUSCULAR | Status: AC
  Administered 2019-10-30: 40 ug via SUBCUTANEOUS
  Filled 2019-10-29 (×2): qty 0.4

## 2019-10-29 MED ORDER — SODIUM CHLORIDE 0.9 % IV SOLN
2.0000 g | INTRAVENOUS | Status: DC
  Administered 2019-10-30: 2 g via INTRAVENOUS
  Filled 2019-10-29: qty 2

## 2019-10-29 MED ORDER — PANTOPRAZOLE SODIUM 40 MG IV SOLR: 40.0000 mg | Freq: Two times a day (BID) | INTRAVENOUS | Status: DC

## 2019-10-29 MED ORDER — PANTOPRAZOLE SODIUM 40 MG IV SOLR
INTRAVENOUS | Status: AC
  Filled 2019-10-29: qty 160

## 2019-10-29 MED ORDER — SODIUM CHLORIDE 0.9 % IV SOLN
2.0000 g | Freq: Once | INTRAVENOUS | Status: AC
  Administered 2019-10-29: 2 g via INTRAVENOUS
  Filled 2019-10-29: qty 2

## 2019-10-29 MED ORDER — VANCOMYCIN HCL IN DEXTROSE 1-5 GM/200ML-% IV SOLN: 1000.0000 mg | INTRAVENOUS | Status: DC

## 2019-10-29 MED ORDER — LACTATED RINGERS IV BOLUS (SEPSIS)
250.0000 mL | Freq: Once | INTRAVENOUS | Status: AC
  Administered 2019-10-29: 250 mL via INTRAVENOUS

## 2019-10-29 MED ORDER — ONDANSETRON HCL 4 MG/2ML IJ SOLN: 4.0000 mg | Freq: Four times a day (QID) | INTRAMUSCULAR | Status: DC | PRN

## 2019-10-29 MED ORDER — LACTATED RINGERS IV BOLUS
1000.0000 mL | Freq: Once | INTRAVENOUS | Status: AC
  Administered 2019-10-29: 1000 mL via INTRAVENOUS

## 2019-10-29 MED ORDER — POTASSIUM CHLORIDE CRYS ER 20 MEQ PO TBCR
20.0000 meq | EXTENDED_RELEASE_TABLET | Freq: Every day | ORAL | Status: DC
  Administered 2019-10-30 – 2019-11-02 (×5): 20 meq via ORAL
  Filled 2019-10-29 (×5): qty 1

## 2019-10-29 MED ORDER — SODIUM CHLORIDE 0.9 % IV SOLN
8.0000 mg/h | INTRAVENOUS | Status: AC
  Administered 2019-10-29 – 2019-10-30 (×3): 8 mg/h via INTRAVENOUS
  Filled 2019-10-29 (×10): qty 80

## 2019-10-29 NOTE — ED Provider Notes (Signed)
[facility]  EMERGENCY DEPARTMENT Provider Note  CSN: [id number] Arrival date & time: 10/29/19 1657    History Chief Complaint  Patient presents with   Shortness of Breath    HPI  Bailey Bishop is a 79 y.o. female with history of afib and CHF presents to the ED via EMS for evaluation of SOB. She has been seen in the ED a few times in recent weeks for same. Most recently about 2 weeks ago had her diuretics adjusted and felt better so she was discharged home. In the last few days she has had sinus congestion and runny nose with some cough. No reported fever. She went to UC on 7/29 ahd was given nasal spray but she declined a Covid test then. She has been vaccinated. Today EMS reported low O2 at home, started on NRB and given solumedrol enroute. She was not particularly hypoxic on arrival here and is maintaining SpO2 on 2L [place] although she is tachypenic and tachycardic. She also reportedly had a fever at home today. She denies chest pain. No worsened leg swelling. She has been taking her medications as prescribed and she has an appointment in the CHF clinic next week.    Past Medical History:  Diagnosis Date   Anxiety    Arthritis    Asthma    Atrial fibrillation ([place])    a. s/p unsuccessful DCCV in 01/2017 and intolerant to Amiodarone --> Rate-control strategy pursued since b. failed Tikosyn in 11/2017   Breast cancer ([place]) 2001   Right   Essential hypertension    GERD (gastroesophageal reflux disease)    T10 vertebral fracture ([place])     Past Surgical History:  Procedure Laterality Date   BIOPSY N/A 04/22/2018   Procedure: BIOPSY;  Surgeon: Aviva Signs, MD;  [facility];  Service: Gastroenterology;  Laterality: N/A;   BIOPSY  09/06/2019   Procedure: BIOPSY;  Surgeon: Daneil Dolin, MD;  [facility];  Service: Endoscopy;;  gastric   CARDIOVERSION N/A 02/18/2017   Procedure: CARDIOVERSION;  Surgeon: Josue Hector, MD;  Location: [facility]  ENDOSCOPY;  Service: Cardiovascular;  Laterality: N/A;   CARDIOVERSION N/A 12/04/2017   Procedure: CARDIOVERSION;  Surgeon: [name], MD;  Location: [place];  Service: Cardiovascular;  Laterality: N/A;   CATARACT EXTRACTION W/PHACO  07/23/2011   Procedure: CATARACT EXTRACTION PHACO AND INTRAOCULAR LENS PLACEMENT ([place]);  Surgeon: Williams Che, MD;  Location: [facility];  Service: Ophthalmology;  Laterality: Right;  CDE:9.96   CATARACT EXTRACTION W/PHACO Left 11/13/2018   Procedure: CATARACT EXTRACTION PHACO AND INTRAOCULAR LENS PLACEMENT ([facility]);  Surgeon: Baruch Goldmann, MD;  Location: [facility];  Service: Ophthalmology;  Laterality: Left;  left, CDE: 7.44   CHOLECYSTECTOMY N/A 02/14/2015   Procedure: LAPAROSCOPIC CHOLECYSTECTOMY;  Surgeon: Aviva Signs, MD;  Location: [facility];  Service: General;  Laterality: N/A;   COLONOSCOPY N/A 04/22/2018   Procedure: COLONOSCOPY;  Surgeon: Aviva Signs, MD;  [facility];  Service: Gastroenterology;  Laterality: N/A;   COLONOSCOPY WITH PROPOFOL N/A 09/06/2019   Procedure: COLONOSCOPY WITH PROPOFOL;  Surgeon: Daneil Dolin, MD;  [facility];  Service: Endoscopy;  Laterality: N/A;   ESOPHAGEAL DILATION  09/06/2019   Procedure: ESOPHAGEAL DILATION;  Surgeon: Daneil Dolin, MD;  [facility];  Service: Endoscopy;;   ESOPHAGOGASTRODUODENOSCOPY N/A 04/22/2018   Procedure: ESOPHAGOGASTRODUODENOSCOPY (EGD);  Surgeon: Aviva Signs, MD;  [facility];  Service: Gastroenterology;  Laterality: N/A;   ESOPHAGOGASTRODUODENOSCOPY (EGD)  WITH PROPOFOL N/A 09/06/2019   Procedure: ESOPHAGOGASTRODUODENOSCOPY (EGD) WITH PROPOFOL;  Surgeon: Daneil Dolin, MD;  [facility];  Service: Endoscopy;  Laterality: N/A;   MASTECTOMY     bilateral mastectomy-right breast cancer-left taken by choice   POLYPECTOMY  04/22/2018   Procedure: POLYPECTOMY;  Surgeon: Aviva Signs, MD;  [facility];  Service:  Gastroenterology;;   POLYPECTOMY  09/06/2019   Procedure: POLYPECTOMY;  Surgeon: Daneil Dolin, MD;  [facility];  Service: Endoscopy;;  cecal, ascending, hepatic flexure, sigmoid   SEPTOPLASTY  1995    Family History  Problem Relation Age of Onset   Cancer Mother    Aneurysm Father    Cancer Sister    Cancer Sister    Anesthesia problems Neg Hx    Hypotension Neg Hx    Malignant hyperthermia Neg Hx    Pseudochol deficiency Neg Hx    Colon cancer Neg Hx     Social History   Tobacco Use   Smoking status: Former Smoker    Packs/day: 1.00    Years: 28.00    Pack years: 28.00    Types: Cigarettes    Quit date: 07/17/1983    Years since quitting: 36.3   Smokeless tobacco: Never Used  Vaping Use   Vaping Use: Never used  Substance Use Topics   Alcohol use: No   Drug use: No     Home Medications Prior to Admission medications   Medication Sig Start Date End Date Taking? Authorizing Provider  acetaminophen (TYLENOL) 650 MG CR tablet Take 650 mg by mouth every 8 (eight) hours as needed for pain.    [provider]  albuterol (VENTOLIN HFA) 108 (90 Base) MCG/ACT inhaler Inhale 1-2 puffs into the lungs every 6 (six) hours as needed for wheezing or shortness of breath.    [provider]  ALPRAZolam Duanne Moron) 0.5 MG tablet Take 0.5 mg by mouth at bedtime as needed for anxiety.  06/18/14   [provider]  apixaban (ELIQUIS) 2.5 MG TABS tablet Take 1 tablet (2.5 mg total) by mouth 2 (two) times daily. 09/24/19   Roxan Hockey, MD  bisoprolol (ZEBETA) 5 MG tablet Take 0.5 tablets (2.5 mg total) by mouth daily. 09/24/19   Roxan Hockey, MD  calcium citrate-vitamin D (CITRACAL+D) 315-200 MG-UNIT tablet Take 1 tablet by mouth 2 (two) times daily.    [provider]  cetirizine HCl (ZYRTEC) 1 MG/ML solution Take 5 mLs (5 mg total) by mouth daily. 10/22/19   Wurst, [place], PA-C  fluticasone (FLONASE) 50 MCG/ACT nasal spray  Place 2 sprays into both nostrils daily. 10/22/19   Wurst, [place], PA-C  furosemide (LASIX) 20 MG tablet Take 1 tablet (20 mg total) by mouth daily. Decrease Lasix to 20 mg daily starting on 09/28/2019, do not take Lasix until 09/28/2019 09/28/19   Roxan Hockey, MD  metolazone (ZAROXOLYN) 2.5 MG tablet Take 1 tablet (2.5 mg total) by mouth daily for 1 day. 10/10/19 10/11/19  Noemi Chapel, MD  metolazone (ZAROXOLYN) 2.5 MG tablet Take 1 tablet (2.5 mg total) by mouth daily. 10/10/19   Daleen Bo, MD  omeprazole (PRILOSEC) 40 MG capsule Take 40 mg by mouth as needed.    [provider]  potassium chloride SA (KLOR-CON) 20 MEQ tablet Take 1 tablet (20 mEq total) by mouth daily. Start on 09/28/2019 when you restart Lasix 09/28/19   Roxan Hockey, MD  torsemide (DEMADEX) 20 MG tablet Take 1 tablet (20 mg total)  by mouth 2 (two) times daily. 10/10/19 11/09/19  Noemi Chapel, MD  torsemide (DEMADEX) 20 MG tablet Take 1 tablet (20 mg total) by mouth 2 (two) times daily. 10/10/19   Daleen Bo, MD  traZODone (DESYREL) 50 MG tablet Take 1 tablet (50 mg total) by mouth at bedtime as needed for sleep. 09/24/19   Roxan Hockey, MD     Allergies    Cardizem cd [diltiazem hcl er beads], Amiodarone, Metoprolol tartrate, Sulfa antibiotics, Tomato, Carvedilol, and Chocolate   Review of Systems   Review of Systems A comprehensive review of systems was completed and negative except as noted in HPI.    Physical Exam BP 101/61    Pulse 84    Temp (!) 103.2 F (39.6 C) (Rectal)    Resp (!) 22    SpO2 97%   Physical Exam Vitals and nursing note reviewed.  Constitutional:      Appearance: Normal appearance.  HENT:     Head: Normocephalic and atraumatic.     Nose: Nose normal.     Mouth/Throat:     Mouth: Mucous membranes are moist.  Eyes:     Extraocular Movements: Extraocular movements intact.     Conjunctiva/sclera: Conjunctivae normal.  Cardiovascular:     Rate and Rhythm: Normal rate.    Pulmonary:     Effort: Pulmonary effort is normal. Tachypnea present.     Breath sounds: Rales present.  Abdominal:     General: Abdomen is flat.     Palpations: Abdomen is soft.     Tenderness: There is no abdominal tenderness.  Musculoskeletal:        General: No swelling. Normal range of motion.     Cervical back: Neck supple.     Comments: Trace edema BLE, symmetric  Skin:    General: Skin is warm and dry.  Neurological:     General: No focal deficit present.     Mental Status: She is alert.  Psychiatric:        Mood and Affect: Mood normal.      ED Results / Procedures / Treatments   Labs (all labs ordered are listed, but only abnormal results are displayed) Labs Reviewed  COMPREHENSIVE METABOLIC PANEL - Abnormal; Notable for the following components:      Result Value   Potassium 3.0 (*)    Chloride 97 (*)    BUN 27 (*)    Creatinine, Ser 2.80 (*)    Calcium 8.5 (*)    Total Bilirubin 1.8 (*)    GFR calc non Af Amer 15 (*)    GFR calc Af Amer 18 (*)    Anion gap 16 (*)    All other components within normal limits  BRAIN NATRIURETIC PEPTIDE - Abnormal; Notable for the following components:   B Natriuretic Peptide 1,507.0 (*)    All other components within normal limits  CBC WITH DIFFERENTIAL/PLATELET - Abnormal; Notable for the following components:   RBC 2.58 (*)    Hemoglobin 6.4 (*)    HCT 21.7 (*)    MCH 24.8 (*)    MCHC 29.5 (*)    RDW 19.3 (*)    Neutro Abs 8.6 (*)    Lymphs Abs 0.5 (*)    All other components within normal limits  LACTIC ACID, PLASMA - Abnormal; Notable for the following components:   Lactic Acid, Venous 3.7 (*)    All other components within normal limits  D-DIMER, QUANTITATIVE (NOT AT [facility]) - Abnormal; Notable for  the following components:   D-Dimer, Quant 2.70 (*)    All other components within normal limits  FERRITIN - Abnormal; Notable for the following components:   Ferritin 9 (*)    All other components within normal  limits  FIBRINOGEN - Abnormal; Notable for the following components:   Fibrinogen 206 (*)    All other components within normal limits  C-REACTIVE PROTEIN - Abnormal; Notable for the following components:   CRP 1.1 (*)    All other components within normal limits  URINALYSIS, ROUTINE W REFLEX MICROSCOPIC - Abnormal; Notable for the following components:   Protein, ur 30 (*)    Bacteria, UA RARE (*)    All other components within normal limits  POC OCCULT BLOOD, ED - Abnormal; Notable for the following components:   Fecal Occult Bld POSITIVE (*)    All other components within normal limits  TROPONIN I (HIGH SENSITIVITY) - Abnormal; Notable for the following components:   Troponin I (High Sensitivity) 18 (*)    All other components within normal limits  SARS CORONAVIRUS 2 BY RT PCR (HOSPITAL ORDER, [facility])  CULTURE, BLOOD (ROUTINE X 2)  CULTURE, BLOOD (ROUTINE X 2)  CULTURE, BLOOD (SINGLE)  PROCALCITONIN  LACTATE DEHYDROGENASE  TRIGLYCERIDES  LACTIC ACID, PLASMA  TROPONIN I (HIGH SENSITIVITY)    EKG EKG Interpretation  Date/Time:  Thursday October 29 2019 17:19:15 EDT Ventricular Rate:  129 PR Interval:    QRS Duration: 90 QT Interval:  366 QTC Calculation: 541 R Axis:   -78 Text Interpretation: Atrial fibrillation Artifact Consecutive pvcs Left anterior fascicular block Anterior infarct, old Borderline repolarization abnormality Prolonged QT interval Since last tracing PVCs now present and consecutive Confirmed by Calvert Cantor [phone]) on 10/29/2019 5:35:12 PM   Radiology DG Chest Port 1 View  Result Date: 10/29/2019 CLINICAL DATA:  Shortness of breath and fever today. EXAM: PORTABLE CHEST 1 VIEW COMPARISON:  Most recent radiograph 10/10/2019. Most recent CT 09/05/2019. FINDINGS: Stable cardiomegaly. Interstitial coarsening appears similar to prior exam. Areas of scarring in both lungs. Suspected small right pleural effusion. Linear atelectasis  in the left mid lung versus fluid in the left inter lobar fissure. No focal airspace disease to suggest pneumonia. No pneumothorax. Bones are under mineralized. IMPRESSION: 1. Stable cardiomegaly. 2. Chronic interstitial coarsening. Probable small right pleural effusion. 3. No radiographic evidence of pneumonia. Electronically Signed   By: Keith Rake M.D.   On: 10/29/2019 17:35    Procedures .Critical Care Performed by: Truddie Hidden, MD Authorized by: Truddie Hidden, MD   Critical care provider statement:    Critical care time (minutes):  60   Critical care time was exclusive of:  Separately billable procedures and treating other patients   Critical care was necessary to treat or prevent imminent or life-threatening deterioration of the following conditions:  Circulatory failure, sepsis and respiratory failure   Critical care was time spent personally by me on the following activities:  Development of treatment plan with patient or surrogate, discussions with consultants, evaluation of patient's response to treatment, examination of patient, obtaining history from patient or surrogate, ordering and performing treatments and interventions, ordering and review of radiographic studies, pulse oximetry, ordering and review of laboratory studies, re-evaluation of patient's condition and review of old charts    Medications Ordered in the ED Medications  0.9 %  sodium chloride infusion (0 mL/hr Intravenous Hold 10/29/19 1839)  lactated ringers bolus 1,000 mL (has no administration in time  range)    And  lactated ringers bolus 250 mL (has no administration in time range)  ceFEPIme (MAXIPIME) 2 g in sodium chloride 0.9 % 100 mL IVPB (has no administration in time range)  metroNIDAZOLE (FLAGYL) IVPB 500 mg (has no administration in time range)  vancomycin (VANCOCIN) IVPB 1000 mg/200 mL premix (has no administration in time range)  lactated ringers bolus 1,000 mL (0 mLs Intravenous Stopped  10/29/19 1940)     MDM Rules/Calculators/A&P MDM Patient with SOB, reported hypoxia in the field, improved on arrival here. Confirmed to be febrile, Covid suspected, sepsis workup also initiated.  ED Course  I have reviewed the triage vital signs and the nursing notes.  Pertinent labs & imaging results that were available during my care of the patient were reviewed by me and considered in my medical decision making (see chart for details).  Clinical Course as of Oct 28 2004  Thu Oct 29, 2019  1736 Patient given APAP by EMS just PTA.    [CS]  1740 CXK images and results reviewed, no definite infiltrates to suggest pneumonia or severe covid.    [CS]  4818 BP now borderline low. Will give a liter of LR, although at this point Covid is most likely diagnosis so will hold off on large volume bolus.    [CS]  5631 CBC shows worsening Hgb, which may account for some of her dyspnea. Will begin transfusion. BNP also elevated. Slow rate for transfusion.    [CS]  4970 Discussed anemia with patient. Per RN, hemoccult is strongly positive. I discussed risks benefits and alternatives of transfusion with the patient and she is refusing. She states 'my blood is my blood'. She understands that with continuing bleeding and no transfusion she will likely get worse. She was reminded she can change her mind at any time if she chooses to.    [CS]  1845 Lactic acid is moderately elevated, but not >4. Will recheck after IVF.    [CS]  1846 Per last Echo, patient's EF is 35-40%.    [CS]  2637 UA is neg for infection.    [CS]  8588 WBC is not elevated, lymphopenia may be due to Covid.    [CS]  1918 Patient's covid is negative, raising concern for bacterial source. Will give full LR bolus, broad spectrum abx and discuss admission with hospitalist.    [CS]  2004 Spoke with Dr. Waldron Labs, Hospitalist who will admit.    [CS]    Clinical Course User Index [CS] Truddie Hidden, MD    Final Clinical  Impression(s) / ED Diagnoses Final diagnoses:  Sepsis, due to unspecified organism, unspecified whether acute organ dysfunction present [facility])  Acute blood loss anemia    Rx / DC Orders ED Discharge Orders    None       Truddie Hidden, MD 10/29/19 2006

## 2019-10-29 NOTE — ED Notes (Signed)
CRITICAL VALUE ALERT  Critical Value:  Hg 6.4  Date & Time Notied:  10/29/19 1832  Provider Notified: Dr. Karle Starch  Orders Received/Actions taken: None yet

## 2019-10-29 NOTE — Progress Notes (Addendum)
Pharmacy Antibiotic Note  Bailey Bishop is a 79 y.o. female admitted on 10/29/2019 with sepsis - unknown source.  Pharmacy has been consulted for vancomycin and cefepime dosing.  Plan: Vancomycin 1000 mg IV every 48 hours.  Goal trough 15-20 mcg/mL.  Cefepime 2 g IV q24h Monitor clinical picture, renal function, vancomycin trough level if indicated F/U C&S, abx deescalation / LOT   Temp (24hrs), Avg:103.2 F (39.6 C), Min:103.2 F (39.6 C), Max:103.2 F (39.6 C)  Recent Labs  Lab 10/29/19 1707 10/29/19 1726  WBC 9.8  --   CREATININE 2.80*  --   LATICACIDVEN  --  3.7*    Ht: 5'4" Wt: last reported 150 lbs Estimated creatinine clearance: 14 ml/min using IBW 54.7kg  Allergies  Allergen Reactions  . Cardizem Cd [Diltiazem Hcl Er Beads] Diarrhea  . Amiodarone Nausea And Vomiting  . Metoprolol Tartrate Diarrhea  . Sulfa Antibiotics Nausea And Vomiting  . Tomato Other (See Comments)    Burns stomach  . Carvedilol Diarrhea and Rash  . Chocolate Rash and Other (See Comments)    Dark chocolate      Thank you for allowing pharmacy to be a part of this patient's care.  Efraim Kaufmann, PharmD, BCPS 10/29/2019 8:10 PM

## 2019-10-29 NOTE — Progress Notes (Signed)
MEDICATION RELATED CONSULT NOTE - INITIAL   Pharmacy Consult for Darbepoetin Alfa Indication: Anemia due to CKD & blood loss and Jehovah Witness  Allergies  Allergen Reactions  . Cardizem Cd [Diltiazem Hcl Er Beads] Diarrhea  . Amiodarone Nausea And Vomiting  . Metoprolol Tartrate Diarrhea  . Sulfa Antibiotics Nausea And Vomiting  . Tomato Other (See Comments)    Burns stomach  . Carvedilol Diarrhea and Rash  . Chocolate Rash and Other (See Comments)    Dark chocolate    Patient Measurements:  Last reported weight: 68 kg  Vital Signs: Temp: 98.8 F (37.1 C) (08/05 2030) Temp Source: Oral (08/05 2030) BP: 111/62 (08/05 2100) Pulse Rate: 86 (08/05 2100)   Labs: Recent Labs    10/29/19 1707  WBC 9.8  HGB 6.4*  HCT 21.7*  PLT 198  CREATININE 2.80*  ALBUMIN 3.5  PROT 7.3  AST 24  ALT 9  ALKPHOS 89  BILITOT 1.8*    Medical History: Past Medical History:  Diagnosis Date  . Anxiety   . Arthritis   . Asthma   . Atrial fibrillation ([place])    a. s/p unsuccessful DCCV in 01/2017 and intolerant to Amiodarone --> Rate-control strategy pursued since b. failed Tikosyn in 11/2017  . Breast cancer ([place]) 2001   Right  . Essential hypertension   . GERD (gastroesophageal reflux disease)   . T10 vertebral fracture [facility])      Assessment: Pharmacy to dose darbepoetin alfa for this Jehovah Witness patient with anemia due to CKD and blood loss.  No previous documented administration of darbopoetin alfa or erythropoetin.   Goal of Therapy:  Avoid RBC transfusions  Plan:  Darbepoetin alfa 40 mcg subq x 1 tomorrow Recommend evaluating iron status and provide supplemental iron if serum ferritin < 100 ng/ml or TSAT < 20%  Efraim Kaufmann, PharmD, BCPS 10/29/2019,9:59 PM

## 2019-10-29 NOTE — ED Notes (Signed)
CRITICAL VALUE ALERT  Critical Value:  Lactic 3.7  Date & Time Notied:  10/29/19 1848  Provider Notified: Dr. Karle Starch   Orders Received/Actions taken: Fluids

## 2019-10-29 NOTE — ED Notes (Signed)
Pt refuses blood transfusion

## 2019-10-29 NOTE — ED Triage Notes (Signed)
EMS reports pt has been in the hospital 4 times in the past 6 weeks for "heart problems."  Pt c/o sob and started having fever today.  Reports has been vaccinated for covid and tested negative for covid last week.  EMS says pt sob today, R lung sounds congested.  Pt on NRB and sat 100% as long as she is still.  Pt alert and oriented, denies any pain.  EMS started 18 g iv in L ac and gave 125mg  solumedrol PTA.

## 2019-10-29 NOTE — H&P (Signed)
TRH H&P   Patient Demographics:    Bailey Bishop, is a 79 y.o. female  MRN: [id number]   DOB - May 20, 1940  Admit Date - 10/29/2019  Outpatient Primary MD for the patient is [name], MD  Referring MD/NP/PA: Dr Karle Starch  Patient coming from: homr  Chief Complaint  Patient presents with  . Shortness of Breath      HPI:    Bailey Bishop  is a 79 y.o. female, with medical history significant of A. Fib (on eliquis), diastolic HF, GERD, anxiety, HTN, CKD stage 3b, hx of right breast cancer (status post bilateral mastectomy), asthma (mild and intermittent), recent admission around 09/03/19--09/06/19 due to SOB and findings of anemia at that time (no transfusion needed and no active source of bleeding appreciated); patient was subsequently seen on 09/10/19 due to similar complaints at the ED and treated for bronchitis and discharge home, and another from 6/25-7/1 due to acute on chronic diastolic CHF.  - patient with recent endoscopy/colonoscopy and 09/06/2019, significant for diverticulosis/polyps, and erosive gastropathy and sketches ring. -Patient presents to ED secondary to complaints of shortness of breath as well she had recent ED visits for the same, most recently about 2 weeks ago where her diuretics has been adjusted and she was discharged home, she has been reporting for last few days some sinus congestion, runny nose and some cough, she denies fever, went to UC on 7/29 where she was given nasal spray, she did decline Covid test then, given she was vaccinated, EMS patient was hypoxic where she was given Solu-Medrol in route, in ED patient only requiring 2 L nasal cannula, she was noted to be febrile of 103, chest x-ray significant for some congestion, her Covid test has been negative, she has negative urine analysis, her hemoglobin was noted to be low at 6.4, reports she is compliant  with Eliquis, she denies any NSAIDs use, denies any coffee-ground emesis, patient did refuse blood transfusion, and she was asked why reports she is Jehovah's Witness and she does not want any blood products transfusion, patient was started on broad-spectrum antibiotics, and [facility] hospitalist consulted to admit.    Review of systems:    In addition to the HPI above, She denies fever, but noted to be febrile 103 in ED, she reports generalized weakness No Headache, No changes with Vision or hearing, No problems swallowing food or Liquids, No Chest pain, reports cough and shortness of breath No Abdominal pain, No Nausea or Vommitting, Bowel movements are regular, No Blood in stool or Urine, No dysuria, No new skin rashes or bruises, No new joints pains-aches,  No new weakness, tingling, numbness in any extremity, No recent weight gain or loss, No polyuria, polydypsia or polyphagia, No significant Mental Stressors.  A full 10 point Review of Systems was done, except as stated above, all other Review of Systems were negative.   With  Past History of the following :    Past Medical History:  Diagnosis Date  . Anxiety   . Arthritis   . Asthma   . Atrial fibrillation ([place])    a. s/p unsuccessful DCCV in 01/2017 and intolerant to Amiodarone --> Rate-control strategy pursued since b. failed Tikosyn in 11/2017  . Breast cancer ([place]) 2001   Right  . Essential hypertension   . GERD (gastroesophageal reflux disease)   . T10 vertebral fracture [facility])       Past Surgical History:  Procedure Laterality Date  . BIOPSY N/A 04/22/2018   Procedure: BIOPSY;  Surgeon: Aviva Signs, MD;  [facility];  Service: Gastroenterology;  Laterality: N/A;  . BIOPSY  09/06/2019   Procedure: BIOPSY;  Surgeon: Daneil Dolin, MD;  [facility];  Service: Endoscopy;;  gastric  . CARDIOVERSION N/A 02/18/2017   Procedure: CARDIOVERSION;  Surgeon: Josue Hector, MD;  Location: [facility]  ENDOSCOPY;  Service: Cardiovascular;  Laterality: N/A;  . CARDIOVERSION N/A 12/04/2017   Procedure: CARDIOVERSION;  Surgeon: [name], MD;  Location: [place];  Service: Cardiovascular;  Laterality: N/A;  . CATARACT EXTRACTION W/PHACO  07/23/2011   Procedure: CATARACT EXTRACTION PHACO AND INTRAOCULAR LENS PLACEMENT ([place]);  Surgeon: Williams Che, MD;  Location: [facility];  Service: Ophthalmology;  Laterality: Right;  CDE:9.96  . CATARACT EXTRACTION W/PHACO Left 11/13/2018   Procedure: CATARACT EXTRACTION PHACO AND INTRAOCULAR LENS PLACEMENT (I[facility]);  Surgeon: Baruch Goldmann, MD;  Location: [facility];  Service: Ophthalmology;  Laterality: Left;  left, CDE: 7.44  . CHOLECYSTECTOMY N/A 02/14/2015   Procedure: LAPAROSCOPIC CHOLECYSTECTOMY;  Surgeon: Aviva Signs, MD;  Location: [facility];  Service: General;  Laterality: N/A;  . COLONOSCOPY N/A 04/22/2018   Procedure: COLONOSCOPY;  Surgeon: Aviva Signs, MD;  [facility];  Service: Gastroenterology;  Laterality: N/A;  . COLONOSCOPY WITH PROPOFOL N/A 09/06/2019   Procedure: COLONOSCOPY WITH PROPOFOL;  Surgeon: Daneil Dolin, MD;  [facility];  Service: Endoscopy;  Laterality: N/A;  . ESOPHAGEAL DILATION  09/06/2019   Procedure: ESOPHAGEAL DILATION;  Surgeon: Daneil Dolin, MD;  [facility];  Service: Endoscopy;;  . ESOPHAGOGASTRODUODENOSCOPY N/A 04/22/2018   Procedure: ESOPHAGOGASTRODUODENOSCOPY (EGD);  Surgeon: Aviva Signs, MD;  [facility];  Service: Gastroenterology;  Laterality: N/A;  . ESOPHAGOGASTRODUODENOSCOPY (EGD) WITH PROPOFOL N/A 09/06/2019   Procedure: ESOPHAGOGASTRODUODENOSCOPY (EGD) WITH PROPOFOL;  Surgeon: Daneil Dolin, MD;  [facility];  Service: Endoscopy;  Laterality: N/A;  . MASTECTOMY     bilateral mastectomy-right breast cancer-left taken by choice  . POLYPECTOMY  04/22/2018   Procedure: POLYPECTOMY;  Surgeon: Aviva Signs, MD;  [facility];  Service:  Gastroenterology;;  . POLYPECTOMY  09/06/2019   Procedure: POLYPECTOMY;  Surgeon: Daneil Dolin, MD;  [facility];  Service: Endoscopy;;  cecal, ascending, hepatic flexure, sigmoid  . SEPTOPLASTY  1995      Social History:     Social History   Tobacco Use  . Smoking status: Former Smoker    Packs/day: 1.00    Years: 28.00    Pack years: 28.00    Types: Cigarettes    Quit date: 07/17/1983    Years since quitting: 36.3  . Smokeless tobacco: Never Used  Substance Use Topics  . Alcohol use: No       Family History :     Family History  Problem Relation Age of Onset  . Cancer Mother   .  Aneurysm Father   . Cancer Sister   . Cancer Sister   . Anesthesia problems Neg Hx   . Hypotension Neg Hx   . Malignant hyperthermia Neg Hx   . Pseudochol deficiency Neg Hx   . Colon cancer Neg Hx      Home Medications:   Prior to Admission medications   Medication Sig Start Date End Date Taking? Authorizing Provider  acetaminophen (TYLENOL) 650 MG CR tablet Take 650 mg by mouth every 8 (eight) hours as needed for pain.    [provider]  albuterol (VENTOLIN HFA) 108 (90 Base) MCG/ACT inhaler Inhale 1-2 puffs into the lungs every 6 (six) hours as needed for wheezing or shortness of breath.    [provider]  ALPRAZolam Duanne Moron) 0.5 MG tablet Take 0.5 mg by mouth at bedtime as needed for anxiety.  06/18/14   [provider]  apixaban (ELIQUIS) 2.5 MG TABS tablet Take 1 tablet (2.5 mg total) by mouth 2 (two) times daily. 09/24/19   Roxan Hockey, MD  bisoprolol (ZEBETA) 5 MG tablet Take 0.5 tablets (2.5 mg total) by mouth daily. 09/24/19   Roxan Hockey, MD  calcium citrate-vitamin D (CITRACAL+D) 315-200 MG-UNIT tablet Take 1 tablet by mouth 2 (two) times daily.    [provider]  cetirizine HCl (ZYRTEC) 1 MG/ML solution Take 5 mLs (5 mg total) by mouth daily. 10/22/19   Wurst, [place], PA-C  fluticasone (FLONASE) 50 MCG/ACT nasal spray  Place 2 sprays into both nostrils daily. 10/22/19   Wurst, [place], PA-C  furosemide (LASIX) 20 MG tablet Take 1 tablet (20 mg total) by mouth daily. Decrease Lasix to 20 mg daily starting on 09/28/2019, do not take Lasix until 09/28/2019 09/28/19   Roxan Hockey, MD  metolazone (ZAROXOLYN) 2.5 MG tablet Take 1 tablet (2.5 mg total) by mouth daily for 1 day. 10/10/19 10/11/19  Noemi Chapel, MD  metolazone (ZAROXOLYN) 2.5 MG tablet Take 1 tablet (2.5 mg total) by mouth daily. 10/10/19   Daleen Bo, MD  omeprazole (PRILOSEC) 40 MG capsule Take 40 mg by mouth as needed.    [provider]  potassium chloride SA (KLOR-CON) 20 MEQ tablet Take 1 tablet (20 mEq total) by mouth daily. Start on 09/28/2019 when you restart Lasix 09/28/19   Roxan Hockey, MD  torsemide (DEMADEX) 20 MG tablet Take 1 tablet (20 mg total) by mouth 2 (two) times daily. 10/10/19 11/09/19  Noemi Chapel, MD  torsemide (DEMADEX) 20 MG tablet Take 1 tablet (20 mg total) by mouth 2 (two) times daily. 10/10/19   Daleen Bo, MD  traZODone (DESYREL) 50 MG tablet Take 1 tablet (50 mg total) by mouth at bedtime as needed for sleep. 09/24/19   Roxan Hockey, MD     Allergies:     Allergies  Allergen Reactions  . Cardizem Cd [Diltiazem Hcl Er Beads] Diarrhea  . Amiodarone Nausea And Vomiting  . Metoprolol Tartrate Diarrhea  . Sulfa Antibiotics Nausea And Vomiting  . Tomato Other (See Comments)    Burns stomach  . Carvedilol Diarrhea and Rash  . Chocolate Rash and Other (See Comments)    Dark chocolate     Physical Exam:   Vitals  Blood pressure 101/61, pulse 84, temperature (!) 103.2 F (39.6 C), temperature source Rectal, resp. rate (!) 22, SpO2 97 %.   1. General elderly female, laying in bed, in no apparent distress  2. Normal affect and insight, Not Suicidal or Homicidal, Awake Alert, Oriented X 3.  3.  No F.N deficits, ALL C.Nerves Intact, Strength 5/5 all 4 extremities, Sensation intact all 4 extremities,  Plantars down going.  4. Ears and Eyes appear Normal, Conjunctivae clear, PERRLA. Moist Oral Mucosa.  5. Supple Neck, No JVD, No cervical lymphadenopathy appriciated, No Carotid Bruits.  6. Symmetrical Chest wall movement, Good air movement bilaterally, no wheezing.  7. IRR IRR , No Gallops, Rubs or Murmurs, No Parasternal Heave, has trace edema  8. Positive Bowel Sounds, Abdomen Soft, No tenderness, No organomegaly appriciated,No rebound -guarding or rigidity.  9.  No Cyanosis, Normal Skin Turgor, No Skin Rash or Bruise.  10. Good muscle tone,  joints appear normal , no effusions, Normal ROM.  11. No Palpable Lymph Nodes in Neck or Axillae    Data Review:    CBC Recent Labs  Lab 10/29/19 1707  WBC 9.8  HGB 6.4*  HCT 21.7*  PLT 198  MCV 84.1  MCH 24.8*  MCHC 29.5*  RDW 19.3*  LYMPHSABS 0.5*  MONOABS 0.6  EOSABS 0.1  BASOSABS 0.0   ------------------------------------------------------------------------------------------------------------------  Chemistries  Recent Labs  Lab 10/29/19 1707  NA 137  K 3.0*  CL 97*  CO2 24  GLUCOSE 84  BUN 27*  CREATININE 2.80*  CALCIUM 8.5*  AST 24  ALT 9  ALKPHOS 89  BILITOT 1.8*   ------------------------------------------------------------------------------------------------------------------ CrCl cannot be calculated (Unknown ideal weight.). ------------------------------------------------------------------------------------------------------------------ No results for input(s): TSH, T4TOTAL, T3FREE, THYROIDAB in the last 72 hours.  Invalid input(s): FREET3  Coagulation profile No results for input(s): INR, PROTIME in the last 168 hours. ------------------------------------------------------------------------------------------------------------------- Recent Labs    10/29/19 1726  DDIMER 2.70*    -------------------------------------------------------------------------------------------------------------------  Cardiac Enzymes No results for input(s): CKMB, TROPONINI, MYOGLOBIN in the last 168 hours.  Invalid input(s): CK ------------------------------------------------------------------------------------------------------------------    Component Value Date/Time   BNP 1,507.0 (H) 10/29/2019 1707     ---------------------------------------------------------------------------------------------------------------  Urinalysis    Component Value Date/Time   COLORURINE YELLOW 10/29/2019 [place] 10/29/2019 1735   LABSPEC 1.012 10/29/2019 1735   PHURINE 5.0 10/29/2019 1735   GLUCOSEU NEGATIVE 10/29/2019 1735   HGBUR NEGATIVE 10/29/2019 1735   BILIRUBINUR NEGATIVE 10/29/2019 1735   KETONESUR NEGATIVE 10/29/2019 1735   PROTEINUR 30 (A) 10/29/2019 1735   NITRITE NEGATIVE 10/29/2019 1735   LEUKOCYTESUR NEGATIVE 10/29/2019 1735    ----------------------------------------------------------------------------------------------------------------   Imaging Results:    DG Chest Port 1 View  Result Date: 10/29/2019 CLINICAL DATA:  Shortness of breath and fever today. EXAM: PORTABLE CHEST 1 VIEW COMPARISON:  Most recent radiograph 10/10/2019. Most recent CT 09/05/2019. FINDINGS: Stable cardiomegaly. Interstitial coarsening appears similar to prior exam. Areas of scarring in both lungs. Suspected small right pleural effusion. Linear atelectasis in the left mid lung versus fluid in the left inter lobar fissure. No focal airspace disease to suggest pneumonia. No pneumothorax. Bones are under mineralized. IMPRESSION: 1. Stable cardiomegaly. 2. Chronic interstitial coarsening. Probable small right pleural effusion. 3. No radiographic evidence of pneumonia. Electronically Signed   By: Keith Rake M.D.   On: 10/29/2019 17:35    My personal review of EKG: Rhythm A fib,  Rate  129 /min, QTc 541   Assessment & Plan:    Active Problems:   Essential hypertension   Anticoagulated   COPD (chronic obstructive pulmonary disease) (HCC)   Atrial fibrillation (HCC)   Diverticulosis of large intestine without diverticulitis   CKD (chronic kidney disease), stage IIIb   GI bleed   Fever   Acute blood loss anemia  Fever -No clear etiology, no infectious process on chest x-ray, no urinary symptoms, negative UA, will continue with broad-spectrum antibiotic coverage including vancomycin cefepime and Flagyl, follow on blood cultures. -COVID-19 test is negative, she is vaccinated  Anemia, acute blood loss/chronic kidney disease -Hemoglobin is 6.4, she is Hemoccult positive in ED, patient refused PRBC transfusion, patient reports she is Jehovah's Witness and does not want any blood products, I have explained her anemia, and low hemoglobin, and likelihood of it keeps dropping and the need for hold anticoagulation currently, she understands, and refuses any transfusion -Recent endoscopy significant for erosive gastropathy, will start on Protonix drip and consult GI, keep on clear liquid diet. -We will give Aranesp 1 dose, pharmacy consulted.  Chronic combined systolic/diastolic CHF -appears  to be euvolemic, will continue gentle hydration moderate monitor closely for volume overload specially with holding her diuresis -Continue with bisoprolol unable to use ACE/ARB given worsening renal function and soft BP. -She is supposed to follow-up with Dr. Marigene Ehlers at [place] [facility], first appointment scheduled for Monday, husband was asking if she will be able to be discharged where she can keep that appointment, I have explained to him she is too early in her admission to anticipate her discharge date.  History of A. Fib -We will hold Eliquis for now due to above, will give 1 dose of digoxin for better heart rate control, continue with bisoprolol.  History of asthma -No active  wheezing currently  Prolonged QTC -Correct her hypokalemia, will check magnesium level, and repeat EKG in a.m., will monitor on telemetry   DVT Prophylaxis SCDs   AM Labs Ordered, also please review Full Orders  Family Communication: Admission, patients condition and plan of care including tests being ordered have been discussed with the patient and husband who indicate understanding and agree with the plan and Code Status.  Code Status Full  Likely DC to  Home  Condition GUARDED    Consults called: GI requested in EPIC  Admission status:   inpatient  Time spent in minutes : 60 minutes   Phillips Climes M.D on 10/29/2019 at 8:16 PM   [facility] - Office  [phone]

## 2019-10-30 ENCOUNTER — Encounter (HOSPITAL_COMMUNITY): Payer: Self-pay | Admitting: Internal Medicine

## 2019-10-30 DIAGNOSIS — Z86010 Personal history of colonic polyps: Secondary | ICD-10-CM

## 2019-10-30 DIAGNOSIS — Z7901 Long term (current) use of anticoagulants: Secondary | ICD-10-CM

## 2019-10-30 DIAGNOSIS — K922 Gastrointestinal hemorrhage, unspecified: Secondary | ICD-10-CM

## 2019-10-30 DIAGNOSIS — D62 Acute posthemorrhagic anemia: Secondary | ICD-10-CM

## 2019-10-30 LAB — CBC
HCT: 24.2 % — ABNORMAL LOW (ref 36.0–46.0)
Hemoglobin: 7.2 g/dL — ABNORMAL LOW (ref 12.0–15.0)
MCH: 24.8 pg — ABNORMAL LOW (ref 26.0–34.0)
MCHC: 29.8 g/dL — ABNORMAL LOW (ref 30.0–36.0)
MCV: 83.4 fL (ref 80.0–100.0)
Platelets: 193 10*3/uL (ref 150–400)
RBC: 2.9 MIL/uL — ABNORMAL LOW (ref 3.87–5.11)
RDW: 19.5 % — ABNORMAL HIGH (ref 11.5–15.5)
WBC: 10.3 10*3/uL (ref 4.0–10.5)
nRBC: 0 % (ref 0.0–0.2)

## 2019-10-30 LAB — MAGNESIUM: Magnesium: 1.9 mg/dL (ref 1.7–2.4)

## 2019-10-30 LAB — BASIC METABOLIC PANEL
Anion gap: 15 (ref 5–15)
BUN: 29 mg/dL — ABNORMAL HIGH (ref 8–23)
CO2: 25 mmol/L (ref 22–32)
Calcium: 8.5 mg/dL — ABNORMAL LOW (ref 8.9–10.3)
Chloride: 96 mmol/L — ABNORMAL LOW (ref 98–111)
Creatinine, Ser: 2.67 mg/dL — ABNORMAL HIGH (ref 0.44–1.00)
GFR calc Af Amer: 19 mL/min — ABNORMAL LOW (ref >60)
GFR calc non Af Amer: 16 mL/min — ABNORMAL LOW (ref >60)
Glucose, Bld: 124 mg/dL — ABNORMAL HIGH (ref 70–99)
Potassium: 3.8 mmol/L (ref 3.5–5.1)
Sodium: 136 mmol/L (ref 135–145)

## 2019-10-30 MED ORDER — PANTOPRAZOLE SODIUM 40 MG PO TBEC
40.0000 mg | DELAYED_RELEASE_TABLET | Freq: Two times a day (BID) | ORAL | Status: DC
  Administered 2019-10-31 – 2019-11-02 (×4): 40 mg via ORAL
  Filled 2019-10-30 (×4): qty 1

## 2019-10-30 MED ORDER — PANTOPRAZOLE SODIUM 40 MG IV SOLR
INTRAVENOUS | Status: AC
  Filled 2019-10-30: qty 80

## 2019-10-30 NOTE — Care Management Important Message (Signed)
Important Message  Patient Details  Name: Bailey Bishop MRN: [id number] Date of Birth: 1941-02-02   Medicare Important Message Given:  Yes     Tommy Medal 10/30/2019, 3:54 PM

## 2019-10-30 NOTE — Evaluation (Signed)
Physical Therapy Evaluation Patient Details Name: Bailey Bishop MRN: [id number] DOB: Jan 07, 1941 Today's Date: 10/30/2019   History of Present Illness  Bailey Bishop  is a 79 y.o. female, with medical history significant of A. Fib (on eliquis), diastolic HF, GERD, anxiety, HTN, CKD stage 3b, hx of right breast cancer (status post bilateral mastectomy), asthma (mild and intermittent), recent admission around 09/03/19--09/06/19 due to SOB and findings of anemia at that time (no transfusion needed and no active source of bleeding appreciated); patient was subsequently seen on 09/10/19 due to similar complaints at the ED and treated for bronchitis and discharge home, and another from 6/25-7/1 due to acute on chronic diastolic CHF. - patient with recent endoscopy/colonoscopy and 09/06/2019, significant for diverticulosis/polyps, and erosive gastropathy and sketches ring.-Patient presents to ED secondary to complaints of shortness of breath as well she had recent ED visits for the same, most recently about 2 weeks ago where her diuretics has been adjusted and she was discharged home, she has been reporting for last few days some sinus congestion, runny nose and some cough, she denies fever, went to UC on 7/29 where she was given nasal spray, she did decline Covid test then, given she was vaccinated, EMS patient was hypoxic where she was given Solu-Medrol in route, in ED patient only requiring 2 L nasal cannula, she was noted to be febrile of 103, chest x-ray significant for some congestion, her Covid test has been negative, she has negative urine analysis, her hemoglobin was noted to be low at 6.4, reports she is compliant with Eliquis, she denies any NSAIDs use, denies any coffee-ground emesis, patient did refuse blood transfusion, and she was asked why reports she is Jehovah's Witness and she does not want any blood products transfusion, patient was started on broad-spectrum antibiotics, and [facility] hospitalist  consulted to admit.    Clinical Impression  Patient functioning near/at baseline for functional mobility and gait, limited mostly due to c/o fatigue and lightheadedness more likely due to low HGB.  Patient safer for short distances in room, but fatigues easily when attempting longer distances and advised to pace self to avoid falling understanding acknowledged.  Patient tolerated sitting up at bedside with her spouse present in room after therapy.  Patient will benefit from continued physical therapy in hospital and recommended venue below to increase strength, balance, endurance for safe ADLs and gait.     Follow Up Recommendations Home health PT    Equipment Recommendations  Cane    Recommendations for Other Services       Precautions / Restrictions Precautions Precautions: Fall Restrictions Weight Bearing Restrictions: No      Mobility  Bed Mobility Overal bed mobility: Modified Independent             General bed mobility comments: Pt seated at EOB upon OT arrival  Transfers Overall transfer level: Modified independent Equipment used: None Transfers: Sit to/from [facility] Sit to Stand: Supervision Stand pivot transfers: Supervision          Ambulation/Gait Ambulation/Gait assistance: Min guard;Supervision Gait Distance (Feet): 40 Feet Assistive device: None Gait Pattern/deviations: Decreased step length - right;Decreased step length - left;Decreased stride length Gait velocity: decreased   General Gait Details: slightly labored cadence with frequent leaning on nearby objects for support once fatigue, good balance for short distances  Stairs            Wheelchair Mobility    Modified Rankin (Stroke Patients Only)  Balance Overall balance assessment: Mild deficits observed, not formally tested                                           Pertinent Vitals/Pain Pain Assessment: No/denies pain    Home  Living Family/patient expects to be discharged to:: Private residence Living Arrangements: Spouse/significant other Available Help at Discharge: Family;Available 24 hours/day Type of Home: House Home Access: Stairs to enter Entrance Stairs-Rails: Right;Left;Can reach both Entrance Stairs-Number of Steps: 5 Home Layout: Two level Home Equipment: Shower seat;Grab bars - tub/shower      Prior Function Level of Independence: Independent         Comments: Hydrographic surveyor, drives     Hand Dominance   Dominant Hand: Right    Extremity/Trunk Assessment   Upper Extremity Assessment Upper Extremity Assessment: Defer to OT evaluation    Lower Extremity Assessment Lower Extremity Assessment: Generalized weakness    Cervical / Trunk Assessment Cervical / Trunk Assessment: Normal  Communication   Communication: No difficulties  Cognition Arousal/Alertness: Awake/alert Behavior During Therapy: WFL for tasks assessed/performed Overall Cognitive Status: Within Functional Limits for tasks assessed                                        General Comments      Exercises     Assessment/Plan    PT Assessment Patient needs continued PT services  PT Problem List Decreased strength;Decreased activity tolerance;Decreased balance;Decreased mobility       PT Treatment Interventions Balance training;Gait training;Stair training;Functional mobility training;Therapeutic activities;Therapeutic exercise;Patient/family education    PT Goals (Current goals can be found in the Care Plan section)  Acute Rehab PT Goals Patient Stated Goal: return home with family to assist PT Goal Formulation: With patient Time For Goal Achievement: 11/02/19 Potential to Achieve Goals: Good    Frequency Min 2X/week   Barriers to discharge        Co-evaluation               AM-PAC PT "6 Clicks" Mobility  Outcome Measure Help needed turning from your back to your side  while in a flat bed without using bedrails?: None Help needed moving from lying on your back to sitting on the side of a flat bed without using bedrails?: None Help needed moving to and from a bed to a chair (including a wheelchair)?: None Help needed standing up from a chair using your arms (e.g., wheelchair or bedside chair)?: None Help needed to walk in hospital room?: A Little Help needed climbing 3-5 steps with a railing? : A Little 6 Click Score: 22    End of Session   Activity Tolerance: Patient tolerated treatment well;Patient limited by fatigue Patient left: in bed;with call bell/phone within reach;with family/visitor present Nurse Communication: Mobility status PT Visit Diagnosis: Unsteadiness on feet (R26.81);Other abnormalities of gait and mobility (R26.89);Muscle weakness (generalized) (M62.81)    Time: 8003-4917 PT Time Calculation (min) (ACUTE ONLY): 23 min   Charges:   PT Evaluation $PT Eval Moderate Complexity: 1 Mod PT Treatments $Therapeutic Activity: 23-37 mins        9:50 AM, 10/30/19 Lonell Grandchild, MPT Physical Therapist with [facility] [phone] office [phone] mobile phone

## 2019-10-30 NOTE — Consult Note (Signed)
Maylon Peppers, M.D. Gastroenterology & Hepatology                                           Patient Name: Bailey Bishop Account #: '@[id number]' @   MRN: [id number] Admission Date: 10/29/2019 Date of Evaluation:  10/30/2019 Time of Evaluation: 2:22 PM   Referring Physician: Roxan Hockey, MD  Chief Complaint: Anemia and positive FOBT  HPI:  This is a 79 y.o. female with medical history significant ofA. Fib (on eliquis), systolic HF with EF 40-98%, GERD, anxiety, HTN, CKD stage 3b, hx of right breast cancer status post bilateral mastectomy, asthma, who was admitted to the hospital for worsening shortness of breath and fatigue.  She was found to have severe iron deficiency anemia.  Gastroenterology was consulted after patient had positive FOBT.  The patient reports that she has been presenting worsening shortness of breath for the last couple months.  The patient denied having any episodes of melena, hematochezia or rectal bleeding.  She reports having dark brown or green stools but never has seen tarry stools.  She states that otherwise she has been feeling fine. The patient denies having any nausea, vomiting, fever, chills, abdominal distention, abdominal pain, diarrhea, jaundice, pruritus or weight loss. She reports she has been on Eliquis for the last 3 years last dose was taken yesterday night. The patient underwent a recent EGD and colonoscopy on 09/06/2019 - EGD showed Schatzki's ring s/p dilation, gastric erosions - biopsies showed chronic gastritis and GIM neg for H. pylori. Colonoscopy showed - Eight 4 to 8 mm polyps in the sigmoid colon, in the descending colon, at the hepatic flexure and in the cecum, one 13 mm polyp in the descending colon (some TA). No evidence of any lesions to explain anemia were present.  I personally reviewed and interpreted the available lab results which showed: Patient had mild anemia since July 2018 with a hemoglobin in the range between 11 and 12.  However, in  June 2021 was noted that her hemoglobin dropped down to 8.9.  Her hemoglobin has remained in the 8.0 range but upon admission she was found to have a hemoglobin of 6.4.  Recheck today was 7.2 with MCV of 83.  Also, iron stores showed a ferritin of 9 low (on 10/08/2019 it was 10), TIBC 458 elevated , iron 20 low, folic acid 11.9 normal, B12 312 normal.  The patient is a Jeovah's witness, will not receive transfusion of PRBC.  Other labs upon admission showed a BMP with a creatinine of 2.6 elevated, BUN 29 which is at baseline, normal electrolytes.  CMP showed a total bilirubin of 1.8 elevated with rest of hepatic panel within normal limits. I personally reviewed and interpreted the available imaging which showed CXR with left sided pleural effusion but no other acute alterations.  FHx: neg for any gastrointestinal/liver disease, sisters breast cancer x2 Social: former smoke quit 35 years ago , neg alcohol or illicit drug use Surgical: cholecystectomy  Past Medical History: SEE CHRONIC ISSSUES: Past Medical History:  Diagnosis Date  . Anxiety   . Arthritis   . Asthma   . Atrial fibrillation ([place])    a. s/p unsuccessful DCCV in 01/2017 and intolerant to Amiodarone --> Rate-control strategy pursued since b. failed Tikosyn in 11/2017  . Breast cancer ([place]) 2001   Right  . Essential hypertension   . GERD (gastroesophageal reflux  disease)   . T10 vertebral fracture [facility])    Past Surgical History:  Past Surgical History:  Procedure Laterality Date  . BIOPSY N/A 04/22/2018   Procedure: BIOPSY;  Surgeon: Aviva Signs, MD;  [facility];  Service: Gastroenterology;  Laterality: N/A;  . BIOPSY  09/06/2019   Procedure: BIOPSY;  Surgeon: Daneil Dolin, MD;  [facility];  Service: Endoscopy;;  gastric  . CARDIOVERSION N/A 02/18/2017   Procedure: CARDIOVERSION;  Surgeon: Josue Hector, MD;  Location: [name] ENDOSCOPY;  Service: Cardiovascular;  Laterality: N/A;  . CARDIOVERSION N/A  12/04/2017   Procedure: CARDIOVERSION;  Surgeon: [name], MD;  Location: [facility];  Service: Cardiovascular;  Laterality: N/A;  . CATARACT EXTRACTION W/PHACO  07/23/2011   Procedure: CATARACT EXTRACTION PHACO AND INTRAOCULAR LENS PLACEMENT ([place]);  Surgeon: Williams Che, MD;  Location: [facility];  Service: Ophthalmology;  Laterality: Right;  CDE:9.96  . CATARACT EXTRACTION W/PHACO Left 11/13/2018   Procedure: CATARACT EXTRACTION PHACO AND INTRAOCULAR LENS PLACEMENT (IOC);  Surgeon: Baruch Goldmann, MD;  Location: [facility];  Service: Ophthalmology;  Laterality: Left;  left, CDE: 7.44  . CHOLECYSTECTOMY N/A 02/14/2015   Procedure: LAPAROSCOPIC CHOLECYSTECTOMY;  Surgeon: Aviva Signs, MD;  Location: [facility];  Service: General;  Laterality: N/A;  . COLONOSCOPY N/A 04/22/2018   Procedure: COLONOSCOPY;  Surgeon: Aviva Signs, MD;  [facility];  Service: Gastroenterology;  Laterality: N/A;  . COLONOSCOPY WITH PROPOFOL N/A 09/06/2019   Procedure: COLONOSCOPY WITH PROPOFOL;  Surgeon: Daneil Dolin, MD;  [facility];  Service: Endoscopy;  Laterality: N/A;  . ESOPHAGEAL DILATION  09/06/2019   Procedure: ESOPHAGEAL DILATION;  Surgeon: Daneil Dolin, MD;  [facility];  Service: Endoscopy;;  . ESOPHAGOGASTRODUODENOSCOPY N/A 04/22/2018   Procedure: ESOPHAGOGASTRODUODENOSCOPY (EGD);  Surgeon: Aviva Signs, MD;  [facility];  Service: Gastroenterology;  Laterality: N/A;  . ESOPHAGOGASTRODUODENOSCOPY (EGD) WITH PROPOFOL N/A 09/06/2019   Procedure: ESOPHAGOGASTRODUODENOSCOPY (EGD) WITH PROPOFOL;  Surgeon: Daneil Dolin, MD;  [facility];  Service: Endoscopy;  Laterality: N/A;  . MASTECTOMY     bilateral mastectomy-right breast cancer-left taken by choice  . POLYPECTOMY  04/22/2018   Procedure: POLYPECTOMY;  Surgeon: Aviva Signs, MD;  [facility];  Service: Gastroenterology;;  . POLYPECTOMY  09/06/2019   Procedure: POLYPECTOMY;   Surgeon: Daneil Dolin, MD;  [facility];  Service: Endoscopy;;  cecal, ascending, hepatic flexure, sigmoid  . SEPTOPLASTY  1995   Family History:  Family History  Problem Relation Age of Onset  . Cancer Mother   . Aneurysm Father   . Cancer Sister   . Cancer Sister   . Anesthesia problems Neg Hx   . Hypotension Neg Hx   . Malignant hyperthermia Neg Hx   . Pseudochol deficiency Neg Hx   . Colon cancer Neg Hx    Social History:  Social History   Tobacco Use  . Smoking status: Former Smoker    Packs/day: 1.00    Years: 28.00    Pack years: 28.00    Types: Cigarettes    Quit date: 07/17/1983    Years since quitting: 36.3  . Smokeless tobacco: Never Used  Vaping Use  . Vaping Use: Never used  Substance Use Topics  . Alcohol use: No  . Drug use: No    Home Medications:  Prior to Admission medications   Medication Sig Start Date End Date Taking? Authorizing Provider  acetaminophen (TYLENOL)  650 MG CR tablet Take 650 mg by mouth every 8 (eight) hours as needed for pain.   Yes [provider]  albuterol (VENTOLIN HFA) 108 (90 Base) MCG/ACT inhaler Inhale 1-2 puffs into the lungs every 6 (six) hours as needed for wheezing or shortness of breath.   Yes [provider]  ALPRAZolam Duanne Moron) 0.5 MG tablet Take 0.5 mg by mouth at bedtime as needed for anxiety.  06/18/14  Yes [provider]  apixaban (ELIQUIS) 2.5 MG TABS tablet Take 1 tablet (2.5 mg total) by mouth 2 (two) times daily. 09/24/19  Yes Emokpae, Courage, MD  bisoprolol (ZEBETA) 5 MG tablet Take 0.5 tablets (2.5 mg total) by mouth daily. 09/24/19  Yes Emokpae, Courage, MD  calcium citrate-vitamin D (CITRACAL+D) 315-200 MG-UNIT tablet Take 1 tablet by mouth 2 (two) times daily.   Yes [provider]  cetirizine HCl (ZYRTEC) 1 MG/ML solution Take 5 mLs (5 mg total) by mouth daily. 10/22/19  Yes Wurst, [place], PA-C  fluticasone (FLONASE) 50 MCG/ACT nasal spray Place 2 sprays into  both nostrils daily. 10/22/19  Yes Wurst, [place], PA-C  omeprazole (PRILOSEC) 40 MG capsule Take 40 mg by mouth as needed.   Yes [provider]  potassium chloride SA (KLOR-CON) 20 MEQ tablet Take 1 tablet (20 mEq total) by mouth daily. Start on 09/28/2019 when you restart Lasix 09/28/19  Yes Emokpae, Courage, MD  torsemide (DEMADEX) 20 MG tablet Take 1 tablet (20 mg total) by mouth 2 (two) times daily. 10/10/19  Yes Daleen Bo, MD  traZODone (DESYREL) 50 MG tablet Take 1 tablet (50 mg total) by mouth at bedtime as needed for sleep. 09/24/19  Yes Emokpae, Courage, MD  furosemide (LASIX) 20 MG tablet Take 1 tablet (20 mg total) by mouth daily. Decrease Lasix to 20 mg daily starting on 09/28/2019, do not take Lasix until 09/28/2019 Patient not taking: Reported on 10/29/2019 09/28/19   Roxan Hockey, MD    Inpatient Medications:  Current Facility-Administered Medications:  .  0.9 %  sodium chloride infusion, 10 mL/hr, Intravenous, Once, Elgergawy, Silver Huguenin, MD, Held at 10/29/19 1839 .  bisoprolol (ZEBETA) tablet 2.5 mg, 2.5 mg, Oral, Daily, Elgergawy, Silver Huguenin, MD, 2.5 mg at 10/30/19 0912 .  ceFEPIme (MAXIPIME) 2 g in sodium chloride 0.9 % 100 mL IVPB, 2 g, Intravenous, Q24H, Elgergawy, Silver Huguenin, MD .  Darbepoetin Alfa (ARANESP) injection 40 mcg, 40 mcg, Subcutaneous, Once, Efraim Kaufmann, RPH .  metroNIDAZOLE (FLAGYL) IVPB 500 mg, 500 mg, Intravenous, Q8H, Elgergawy, Silver Huguenin, MD, Last Rate: 100 mL/hr at 10/30/19 0619, 500 mg at 10/30/19 0619 .  ondansetron (ZOFRAN) tablet 4 mg, 4 mg, Oral, Q6H PRN **OR** ondansetron (ZOFRAN) injection 4 mg, 4 mg, Intravenous, Q6H PRN, Elgergawy, Dawood S, MD .  pantoprazole (PROTONIX) 80 mg in sodium chloride 0.9 % 100 mL (0.8 mg/mL) infusion, 8 mg/hr, Intravenous, Continuous, Elgergawy, Silver Huguenin, MD, Last Rate: 10 mL/hr at 10/30/19 0732, 8 mg/hr at 10/30/19 0732 .  [START ON 11/02/2019] pantoprazole (PROTONIX) injection 40 mg, 40 mg, Intravenous, Q12H,  Elgergawy, Dawood S, MD .  potassium chloride SA (KLOR-CON) CR tablet 20 mEq, 20 mEq, Oral, Daily, Elgergawy, Silver Huguenin, MD, 20 mEq at 10/30/19 0912 .  [START ON 10/31/2019] vancomycin (VANCOCIN) IVPB 1000 mg/200 mL premix, 1,000 mg, Intravenous, Q48H, Elgergawy, Silver Huguenin, MD Allergies: Cardizem cd [diltiazem hcl er beads], Amiodarone, Metoprolol tartrate, Sulfa antibiotics, Tomato, Carvedilol, and Chocolate  Complete Review of Systems: GENERAL: negative for malaise, night sweats  HEENT: No changes in hearing or vision, no nose bleeds or other nasal problems. NECK: Negative for lumps, goiter, pain and significant neck swelling RESPIRATORY: Negative for cough, wheezing CARDIOVASCULAR: Negative for chest pain, leg swelling, palpitations, orthopnea GI: SEE HPI MUSCULOSKELETAL: Negative for joint pain or swelling, back pain, and muscle pain. SKIN: Negative for lesions, rash PSYCH: Negative for sleep disturbance, mood disorder and recent psychosocial stressors. HEMATOLOGY Negative for prolonged bleeding, bruising easily, and swollen nodes. ENDOCRINE: Negative for cold or heat intolerance, polyuria, polydipsia and goiter. NEURO: negative for tremor, gait imbalance, syncope and seizures. The remainder of the review of systems is noncontributory.  Physical Exam: BP (!) 105/49 (BP Location: Right Arm)   Pulse 72   Temp 97.8 F (36.6 C) (Oral)   Resp 16   Ht '5\' 4"'  (1.626 m)   Wt 66.9 kg   SpO2 97%   BMI 25.32 kg/m  GENERAL: The patient is AO x3, in no acute distress. Elder. HEENT: Head is normocephalic and atraumatic. EOMI are intact. Mouth is well hydrated and without lesions. Looks pale. NECK: Supple. No masses LUNGS: Clear to auscultation. No presence of rhonchi/wheezing/rales. Adequate chest expansion HEART: RRR, normal s1 and s2. ABDOMEN: Soft, nontender, no guarding, no peritoneal signs, and nondistended. BS +. No masses. RECTAL EXAM: deferred as patient had recent BM in toilet - there  was green stool in it EXTREMITIES: Without any cyanosis, clubbing, rash, lesions or edema. NEUROLOGIC: AOx3, no focal motor deficit. SKIN: no jaundice, no rashes  Laboratory Data CBC:     Component Value Date/Time   WBC 10.3 10/30/2019 0630   RBC 2.90 (L) 10/30/2019 0630   HGB 7.2 (L) 10/30/2019 0630   HCT 24.2 (L) 10/30/2019 0630   PLT 193 10/30/2019 0630   MCV 83.4 10/30/2019 0630   MCH 24.8 (L) 10/30/2019 0630   MCHC 29.8 (L) 10/30/2019 0630   RDW 19.5 (H) 10/30/2019 0630   LYMPHSABS 0.5 (L) 10/29/2019 1707   MONOABS 0.6 10/29/2019 1707   EOSABS 0.1 10/29/2019 1707   BASOSABS 0.0 10/29/2019 1707   COAG:  Lab Results  Component Value Date   INR 2.4 08/25/2018   INR 2.7 08/11/2018   INR 1.2 (A) 07/23/2018    BMP:  BMP Latest Ref Rng & Units 10/30/2019 10/29/2019 10/10/2019  Glucose 70 - 99 mg/dL 124(H) 84 92  BUN 8 - 23 mg/dL 29(H) 27(H) 27(H)  Creatinine 0.44 - 1.00 mg/dL 2.67(H) 2.80(H) 2.58(H)  BUN/Creat Ratio 6 - 22 (calc) - - -  Sodium 135 - 145 mmol/L 136 137 134(L)  Potassium 3.5 - 5.1 mmol/L 3.8 3.0(L) 3.5  Chloride 98 - 111 mmol/L 96(L) 97(L) 99  CO2 22 - 32 mmol/L '25 24 25  ' Calcium 8.9 - 10.3 mg/dL 8.5(L) 8.5(L) 8.6(L)    HEPATIC:  Hepatic Function Latest Ref Rng & Units 10/29/2019 10/08/2019 09/03/2019  Total Protein 6.5 - 8.1 g/dL 7.3 7.5 7.3  Albumin 3.5 - 5.0 g/dL 3.5 3.8 3.5  AST 15 - 41 U/L '24 22 22  ' ALT 0 - 44 U/L '9 13 10  ' Alk Phosphatase 38 - 126 U/L 89 99 123  Total Bilirubin 0.3 - 1.2 mg/dL 1.8(H) 1.1 1.4(H)  Bilirubin, Direct 0.1 - 0.5 mg/dL - - -    CARDIAC:  Lab Results  Component Value Date   TROPONINI <0.03 08/29/2017     Imaging: I personally reviewed and interpreted the available imaging.  Assessment & Plan: Bailey Bishop is a 79 y.o. female with  medical history significant ofA. Fib, systolic HF with EF 43-73%, GERD, anxiety, HTN, CKD stage 3b, hx of right breast cancer status post bilateral mastectomy, asthma, who was admitted to  the hospital for worsening shortness of breath and fatigue, found to have severe anemia with positive FOBT in the ER.  The patient cannot receive blood products given her religious believes.  The patient has not presented any evidence of overt gastrointestinal bleeding but has presented progressive anemia in the setting of chronic anticoagulation.  She has had recent endoscopic investigations that have not shown any source for her iron losses as she is clearly presenting iron deficiency anemia.  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  The patient does not require to be on an IV PPI at this moment, she can go back to her home PPI dose.  I agree that his anticoagulation should be held for now until her hemoglobin stabilizes.  She would benefit from receiving IV iron during this hospitalization to boost up her iron stores.  # Iron deficiency anemia  Recs: - Repeat CBC qday - Stop IV, pantoprazole, restart home PO PPI - 2 large bore IV lines - Keep NPO after MN - Capsule endoscopy tomorrow in AM - Please give IV iron during this hospitalization - Avoid NSAIDs  Harvel Quale, MD Gastroenterology and Hepatology [facility]   Note: Occasional unusual wording and randomly placed punctuation marks may result from the use of speech recognition technology to transcribe this document

## 2019-10-30 NOTE — TOC Initial Note (Signed)
Transition of Care [facility]) - Initial/Assessment Note    Patient Details  Name: Bailey Bishop MRN: [id number] Date of Birth: [birth year]  Transition of Care [facility]) CM/SW Contact:    Shade Flood, LCSW Phone Number: 10/30/2019, 12:26 PM  Clinical Narrative:                  Pt admitted from home. She lives with her husband. Pt has high readmission risk score. Spoke with pt/husband today to assess. Pt is mostly independent in ADLs at home. Her husband assists as needed. Pt is established with a PCP and is able to obtain medications and get to appointments.   Offered HH RN and PT though pt and husband refuse. Offered to arrange for a cane, as recommended by PT, and pt declines.   Will refer to [facility] CM for follow up.   No other TOC needs identified for dc.  Expected Discharge Plan: Home/Self Care Barriers to Discharge: Continued Medical Work up   Patient Goals and CMS Choice        Expected Discharge Plan and Services Expected Discharge Plan: Home/Self Care In-house Referral: [name]     Living arrangements for the past 2 months: Single Family Home                                      Prior Living Arrangements/Services Living arrangements for the past 2 months: Single Family Home Lives with:: Spouse Patient language and need for interpreter reviewed:: Yes Do you feel safe going back to the place where you live?: Yes      Need for Family Participation in Patient Care: Yes (Comment) Care giver support system in place?: Yes (comment) Current home services: DME Criminal Activity/Legal Involvement Pertinent to Current Situation/Hospitalization: No - Comment as needed  Activities of Daily Living Home Assistive Devices/Equipment: None ADL Screening (condition at time of admission) Patient's cognitive ability adequate to safely complete daily activities?: Yes Is the patient deaf or have difficulty hearing?: No Does the patient have difficulty seeing, even when wearing  glasses/contacts?: No Does the patient have difficulty concentrating, remembering, or making decisions?: No Patient able to express need for assistance with ADLs?: Yes Does the patient have difficulty dressing or bathing?: No Independently performs ADLs?: Yes (appropriate for developmental age) Does the patient have difficulty walking or climbing stairs?: No Weakness of Legs: None Weakness of Arms/Hands: None  Permission Sought/Granted                  Emotional Assessment       Orientation: : Oriented to Self, Oriented to Place, Oriented to  Time, Oriented to Situation Alcohol / Substance Use: Not Applicable Psych Involvement: No (comment)  Admission diagnosis:  Acute blood loss anemia [D62] SOB (shortness of breath) [R06.02] Fever [R50.9] Sepsis, due to unspecified organism, unspecified whether acute organ dysfunction present [facility]) [A41.9] Patient Active Problem List   Diagnosis Date Noted  . Fever 10/29/2019  . Acute blood loss anemia 10/29/2019  . Acute on chronic diastolic HF (heart failure) ([place]) 09/18/2019  . GI bleed 09/05/2019  . Symptomatic anemia 09/03/2019  . CKD (chronic kidney disease), stage IIIb 09/03/2019  . Iron deficiency anemia   . Benign neoplasm of transverse colon   . Diverticulosis of large intestine without diverticulitis   . Visit for monitoring Tikosyn therapy 12/02/2017  . Atrial fibrillation ([place]) 03/31/2017  . Persistent atrial fibrillation ([place])   .  Anticoagulated 10/03/2016  . COPD (chronic obstructive pulmonary disease) ([place]) 10/03/2016  . Hypokalemia 09/24/2016  . Asthma in adult without complication 39/05/90  . Hyperglycemia 09/23/2016  . Anxiety   . GERD (gastroesophageal reflux disease)   . Shortness of breath   . Essential hypertension   . Status asthmaticus 07/07/2014   PCP:  [name], MD Pharmacy:   [place], [place] [address] Phone: [phone] Fax:  [phone]  [place], [place] [address] Phone: [phone] Fax: [phone]  [id number] [place] [place] [address] Phone: [phone] Fax: [phone]     Social Determinants of Health (SDOH) Interventions    Readmission Risk Interventions Readmission Risk Prevention Plan 10/30/2019  Transportation Screening Complete  Medication Review Press photographer) Complete  PCP or Specialist appointment within 3-5 days of discharge Complete  HRI or Home Care Consult Patient refused  SW Recovery Care/Counseling Consult Complete  [place] Not Applicable  Some recent data might be hidden

## 2019-10-30 NOTE — Evaluation (Signed)
Occupational Therapy Evaluation Patient Details Name: Bailey STANFILL MRN: [id number] DOB: [birth year] Today's Date: 10/30/2019    History of Present Illness Bailey Bishop  is a 79 y.o. female, Patient presents to ED secondary to complaints of shortness of breath.   Clinical Impression   Pt agreeable to OT evaluation this am, reports improvement in SOB today. Pt performing ADLs and mobility tasks with assist for managing IV pole. Pt with no complaints of SOB or fatigue during session. Pt is at baseline with ADL completion, no further OT services required at this time.      Follow Up Recommendations  No OT follow up    Equipment Recommendations  None recommended by OT       Precautions / Restrictions Precautions Precautions: None Restrictions Weight Bearing Restrictions: No      Mobility Bed Mobility               General bed mobility comments: Pt seated at EOB upon OT arrival  Transfers Overall transfer level: Needs assistance Equipment used: None Transfers: Sit to/from Stand;Stand Pivot Transfers Sit to Stand: Supervision Stand pivot transfers: Supervision                ADL either performed or assessed with clinical judgement   ADL Overall ADL's : Needs assistance/impaired Eating/Feeding: Modified independent;Sitting                   Lower Body Dressing: Supervision/safety;Sitting/lateral leans Lower Body Dressing Details (indicate cue type and reason): donning socks at EOB Toilet Transfer: Copy Details (indicate cue type and reason): supervision for managing IV pole Toileting- Clothing Manipulation and Hygiene: Modified independent;Sitting/lateral lean       Functional mobility during ADLs: Supervision/safety       Vision Baseline Vision/History: Wears glasses Wears Glasses: Reading only Patient Visual Report: No change from baseline Vision Assessment?: No apparent visual deficits             Pertinent Vitals/Pain Pain Assessment: No/denies pain     Hand Dominance Right   Extremity/Trunk Assessment Upper Extremity Assessment Upper Extremity Assessment: Overall WFL for tasks assessed   Lower Extremity Assessment Lower Extremity Assessment: Defer to PT evaluation   Cervical / Trunk Assessment Cervical / Trunk Assessment: Normal   Communication Communication Communication: No difficulties   Cognition Arousal/Alertness: Awake/alert Behavior During Therapy: WFL for tasks assessed/performed Overall Cognitive Status: Within Functional Limits for tasks assessed                                                Home Living Family/patient expects to be discharged to:: Private residence Living Arrangements: Spouse/significant other Available Help at Discharge: Family;Available 24 hours/day Type of Home: House Home Access: Level entry     Home Layout: Two level Alternate Level Stairs-Number of Steps: 13   Bathroom Shower/Tub: Occupational psychologist: Standard     Home Equipment: Shower seat;Grab bars - tub/shower          Prior Functioning/Environment Level of Independence: Independent        Comments: pt reports independence in mobility, ADLs, and driving        OT Problem List: Decreased activity tolerance          End of Session    Activity Tolerance: Patient tolerated treatment well Patient left: in chair;with call  bell/phone within reach  OT Visit Diagnosis: Muscle weakness (generalized) (M62.81)                Time: 1364-3837 OT Time Calculation (min): 11 min Charges:  OT General Charges $OT Visit: 1 Visit OT Evaluation $OT Eval Low Complexity: [place], OTR/L  [phone] 10/30/2019, 8:24 AM

## 2019-10-30 NOTE — Progress Notes (Signed)
Patient Demographics:    Bailey Bishop, is a 79 y.o. female, DOB - January 15, 1941, TKZ:[id number]  Admit date - 10/29/2019   Admitting Physician Albertine Patricia, MD  Outpatient Primary MD for the patient is [name], MD  LOS - 1   Chief Complaint  Patient presents with   Shortness of Breath        Subjective:    Bailey Bishop today has no further fevers, no emesis,  No chest pain,   -Husband at bedside, questions answered,  --fatigue, dizziness and dyspnea on exertion and generalized weakness persist  Assessment  & Plan :    Active Problems:   Essential hypertension   COPD (chronic obstructive pulmonary disease) (HCC)   Anticoagulated   Atrial fibrillation (HCC)   Diverticulosis of large intestine without diverticulitis   CKD (chronic kidney disease), stage IIIb   GI bleed   Fever   Acute blood loss anemia  Brief Summary:- 79 y.o. female with medical history significant ofA. Fib (on eliquis), systolic HF with EF 57-32%, GERD, anxiety, HTN, CKD stage 3b, hx of right breast cancer status post bilateral mastectomy, asthma admitted on 10/29/2019 with fatigue, dizziness, fevers, generalized weakness and Hemoccult positive stool with hemoglobin down to 6.4   A/p 1Acute on chronic symptomatic Anemia/acute GI bleed---suspect chronic anemia of CKD worsened by acute blood loss/GI bleed --patient is a Jehovah witness and declines transfusion Hgb 6.4 >>7.2 (w/o transfusion)---patient did receive Aranesp -patient underwent a recent EGD and colonoscopy on 09/06/2019 - EGD showed Schatzki's ring s/p dilation, gastric erosions - biopsies showed chronic gastritis and GIM neg for H. pylori. Colonoscopy showed - Eight 4 to 8 mm polyps in the sigmoid colon, in the descending colon, at the hepatic flexure and in the cecum, one 13 mm polyp in the descending colon (some TA). No evidence of any lesions to  explain anemia were present during endoluminal evaluation on 09/06/2019 -GI consult appreciated, plan is for capsule endoscopy on 10/31/2019 -Eliquis on hold -Given chronic anemia in a patient with CKD-May benefit from Procrit/ESA injections every couple of weeks -Okay to change IV Protonix to p.o.  2)-HFrEF--acute on chronic combined systolicand diastolicheart failure -Echo from June 2021 with EF of 35 to 40%  and severe global hypokinesis -Continue bisoprolol to 2.5 mg daily  -unable to use ACE/ARB given worsening renal function and soft BP. -Restart Lasix at 20 mg daily upon discharge home -Patient has appointment with Dr. Aundra Dubin at the heart failure clinic on 11/02/2019 at 240 pm--- she is hoping to be discharged prior to that so she can make an appointment  3-history of atrial fibrillation -Rate controlled and stable -Okay to hold Eliquis/Apixaban for now, upon discharge home consider discharging on Eliquis 2.5 mg twice daily for secondary prevention as long as he is hemodynamically stable and no concerns about ongoing GI bleed -Previously was using verapamil;switched over tobisoprolol 2.5 mg dailydue to low EF of 35 to 40%   4-Fevers--had fevers above 103 on admission UA and chest x-ray without definite infection, blood cultures NGTD, Flagyl has been discontinued, consider stopping Vanco and cefepime on 10/31/2019 if cultures are still negative   5-history of asthma -No wheezing currently -Continue monitoring. -Continue as needed bronchodilator nebulizer tx.  6--AKI----acute  kidney injury on CKD stage -IIIb--worsening renal function appears to be due to diuresis -creatinine on admission=2.80, baseline creatinine = 1.4 to 1.6, creatinine is now= 2.67 --renally adjust medications, avoid nephrotoxic agents / dehydration / hypotension   7-history of breast cancer -Status post bilateral mastectomy -Continue surveillance as an outpatient  8-history of anxiety -stable  mood -Continue as needed Xanax.   Code Status:Full code Family Communication:Husband at bedside Disposition: Home  Disposition/Need for in-Hospital Stay- patient unable to be discharged at this time due to --symptomatic anemia with dizziness and dyspnea on exertion requiring further endoluminal evaluation -Fevers and concerns for infection requiring IV antibiotics pending culture results  Status is: Inpatient  Remains inpatient appropriate because:symptomatic anemia with dizziness and dyspnea on exertion requiring further endoluminal evaluation   Disposition: The patient is from: Home              Anticipated d/c is to: Home              Anticipated d/c date is: 2 days              Patient currently is not medically stable to d/c. Barriers: Not Clinically Stable- -symptomatic anemia with dizziness and dyspnea on exertion requiring further endoluminal evaluation -Fevers and concerns for infection requiring IV antibiotics pending culture results  Code Status : full code  Family Communication:   (patient is alert, awake and coherent)  --Discussed with husband at bedside  Consults  :  Gi consult  DVT Prophylaxis  :    - SCDs -Eliquis on hold due to GI bleed  Lab Results  Component Value Date   PLT 193 10/30/2019    Inpatient Medications  Scheduled Meds:  bisoprolol  2.5 mg Oral Daily   [START ON 11/02/2019] pantoprazole  40 mg Intravenous Q12H   potassium chloride SA  20 mEq Oral Daily   Continuous Infusions:  sodium chloride Stopped (10/29/19 1839)   ceFEPime (MAXIPIME) IV     metronidazole 500 mg (10/30/19 1455)   pantoprozole (PROTONIX) infusion 8 mg/hr (10/30/19 1618)   [START ON 10/31/2019] vancomycin     PRN Meds:.ondansetron **OR** ondansetron (ZOFRAN) IV    Anti-infectives (From admission, onward)   Start     Dose/Rate Route Frequency Ordered Stop   10/31/19 2200  vancomycin (VANCOCIN) IVPB 1000 mg/200 mL premix     Discontinue     1,000 mg 200  mL/hr over 60 Minutes Intravenous Every 48 hours 10/29/19 2009     10/30/19 2000  ceFEPIme (MAXIPIME) 2 g in sodium chloride 0.9 % 100 mL IVPB     Discontinue     2 g 200 mL/hr over 30 Minutes Intravenous Every 24 hours 10/29/19 2009     10/30/19 0600  metroNIDAZOLE (FLAGYL) IVPB 500 mg     Discontinue     500 mg 100 mL/hr over 60 Minutes Intravenous Every 8 hours 10/29/19 2016     10/29/19 1930  ceFEPIme (MAXIPIME) 2 g in sodium chloride 0.9 % 100 mL IVPB        2 g 200 mL/hr over 30 Minutes Intravenous  Once 10/29/19 1917 10/29/19 2121   10/29/19 1930  metroNIDAZOLE (FLAGYL) IVPB 500 mg        500 mg 100 mL/hr over 60 Minutes Intravenous  Once 10/29/19 1917 10/29/19 2111   10/29/19 1930  vancomycin (VANCOCIN) IVPB 1000 mg/200 mL premix        1,000 mg 200 mL/hr over 60 Minutes Intravenous  Once 10/29/19 1917  10/29/19 2248        Objective:   Vitals:   10/29/19 2357 10/30/19 0000 10/30/19 0600 10/30/19 1504  BP: 109/79  (!) 105/49 (!) 100/56  Pulse: 92  72 89  Resp: 20  16 16   Temp: 98.5 F (36.9 C)  97.8 F (36.6 C) 97.7 F (36.5 C)  TempSrc: Oral  Oral Oral  SpO2:   97% 98%  Weight:  66.9 kg    Height:  5\' 4"  (1.626 m)      Wt Readings from Last 3 Encounters:  10/30/19 66.9 kg  10/10/19 68.9 kg  09/29/19 69.2 kg     Intake/Output Summary (Last 24 hours) at 10/30/2019 1827 Last data filed at 10/30/2019 0500 Gross per 24 hour  Intake 1304.17 ml  Output --  Net 1304.17 ml     Physical Exam  Gen:- Awake Alert, in no apparent distress  HEENT:- [place].AT, No sclera icterus Neck-Supple Neck,No JVD,.  Lungs-  CTAB , fair symmetrical air movement CV- S1, S2 normal, regular, dizziness and DOE persist Abd-  +ve B.Sounds, Abd Soft, No tenderness,    Extremity/Skin:- No  edema, pedal pulses present Psych-affect is appropriate, oriented x3 Neuro-generalized weakness and fatigue, no new focal deficits, no tremors   Data Review:   Micro Results Recent Results (from the  past 240 hour(s))  SARS Coronavirus 2 by RT PCR (hospital order, performed in [facility] hospital lab) Nasopharyngeal Nasopharyngeal Swab     Status: None   Collection Time: 10/29/19  5:26 PM   Specimen: Nasopharyngeal Swab  Result Value Ref Range Status   SARS Coronavirus 2 NEGATIVE NEGATIVE Final    Comment: (NOTE) SARS-CoV-2 target nucleic acids are NOT DETECTED.  The SARS-CoV-2 RNA is generally detectable in upper and lower respiratory specimens during the acute phase of infection. The lowest concentration of SARS-CoV-2 viral copies this assay can detect is 250 copies / mL. A negative result does not preclude SARS-CoV-2 infection and should not be used as the sole basis for treatment or other patient management decisions.  A negative result may occur with improper specimen collection / handling, submission of specimen other than nasopharyngeal swab, presence of viral mutation(s) within the areas targeted by this assay, and inadequate number of viral copies (<250 copies / mL). A negative result must be combined with clinical observations, patient history, and epidemiological information.  Fact Sheet for Patients:   StrictlyIdeas.no  Fact Sheet for Healthcare Providers: BankingDealers.co.za  This test is not yet approved or  cleared by the [place] FDA and has been authorized for detection and/or diagnosis of SARS-CoV-2 by FDA under an Emergency Use Authorization (EUA).  This EUA will remain in effect (meaning this test can be used) for the duration of the COVID-19 declaration under Section 564(b)(1) of the Act, 21 U.S.C. section 360bbb-3(b)(1), unless the authorization is terminated or revoked sooner.  Performed at [facility], [address], [place], [place] [address]   Blood Culture (routine x 2)     Status: None (Preliminary result)   Collection Time: 10/29/19  5:26 PM   Specimen: BLOOD LEFT FOREARM  Result Value Ref  Range Status   Specimen Description BLOOD LEFT FOREARM  Final   Special Requests   Final    BOTTLES DRAWN AEROBIC AND ANAEROBIC Blood Culture adequate volume   Culture   Final    NO GROWTH < 24 HOURS Performed at [facility], [address] [place], [place] [address]    Report Status PENDING  Incomplete  Blood Culture (routine x 2)     Status: None (Preliminary result)   Collection Time: 10/29/19  5:56 PM   Specimen: Left Antecubital; Blood  Result Value Ref Range Status   Specimen Description LEFT ANTECUBITAL  Final   Special Requests   Final    BOTTLES DRAWN AEROBIC AND ANAEROBIC Blood Culture adequate volume   Culture   Final    NO GROWTH < 24 HOURS Performed at [facility], [address], [place], [place] [address]    Report Status PENDING  Incomplete    Radiology Reports DG Chest 2 View  Result Date: 10/09/2019 CLINICAL DATA:  Shortness of breath, peripheral edema, chronic kidney disease EXAM: CHEST - 2 VIEW COMPARISON:  09/23/2019 FINDINGS: Stable cardiomegaly with mild vascular congestion. No significant developing edema pattern. Similar chronic perihilar and lower lobe bandlike opacities compatible with atelectasis and or scarring. No significant interval change in aeration. No definite superimposed pneumonia, significant collapse or consolidation. Small pleural effusions remain. Aorta atherosclerotic. Trachea is midline. Chronic appearing compression fractures of the lower thoracic spine. IMPRESSION: Stable chronic chest findings with cardiomegaly, vascular congestion, and similar pattern of bilateral atelectasis/scarring. Persistent small pleural effusions No new process by plain radiography Aortic Atherosclerosis (ICD10-I70.0). Electronically Signed   By: Jerilynn Mages.  Shick M.D.   On: 10/09/2019 15:12   US RENAL  Result Date: 10/09/2019 CLINICAL DATA:  Chronic kidney disease stage 4, hypertension, previous smoker EXAM: RENAL / URINARY TRACT ULTRASOUND COMPLETE COMPARISON:   04/13/2019 FINDINGS: Right Kidney: Renal measurements: 8.4 x 4.2 x 3.6 cm = volume: 66 mL . Echogenicity within normal limits. No mass or hydronephrosis visualized. Left Kidney: Renal measurements: 9.0 x 5.1 x 3.4 cm = volume: 80 mL. Left kidney lower pole anechoic cyst measures 3.6 x 2.9 x 3.4 cm. This correlates with the comparison CT. Bladder: Appears normal for degree of bladder distention. Other: None. IMPRESSION: No acute renal abnormality by ultrasound. Negative for hydronephrosis. 3.6 cm left kidney lower pole cyst. Electronically Signed   By: Jerilynn Mages.  Shick M.D.   On: 10/09/2019 15:15   DG Chest Port 1 View  Result Date: 10/29/2019 CLINICAL DATA:  Shortness of breath and fever today. EXAM: PORTABLE CHEST 1 VIEW COMPARISON:  Most recent radiograph 10/10/2019. Most recent CT 09/05/2019. FINDINGS: Stable cardiomegaly. Interstitial coarsening appears similar to prior exam. Areas of scarring in both lungs. Suspected small right pleural effusion. Linear atelectasis in the left mid lung versus fluid in the left inter lobar fissure. No focal airspace disease to suggest pneumonia. No pneumothorax. Bones are under mineralized. IMPRESSION: 1. Stable cardiomegaly. 2. Chronic interstitial coarsening. Probable small right pleural effusion. 3. No radiographic evidence of pneumonia. Electronically Signed   By: Keith Rake M.D.   On: 10/29/2019 17:35   DG Chest Port 1 View  Result Date: 10/10/2019 CLINICAL DATA:  Shortness of breath. EXAM: PORTABLE CHEST 1 VIEW COMPARISON:  10/08/2019 FINDINGS: Stable enlargement of the heart. Coarse lung markings are suggestive for chronic changes. Trachea is midline. Atherosclerotic calcifications at the aortic arch. Surgical clips in the right upper chest and axillary region. No focal airspace disease or pulmonary edema. IMPRESSION: Chronic lung changes without acute findings. Electronically Signed   By: Markus Daft M.D.   On: 10/10/2019 09:54     CBC Recent Labs  Lab  10/29/19 1707 10/30/19 0630  WBC 9.8 10.3  HGB 6.4* 7.2*  HCT 21.7* 24.2*  PLT 198 193  MCV 84.1 83.4  MCH 24.8* 24.8*  MCHC 29.5*  29.8*  RDW 19.3* 19.5*  LYMPHSABS 0.5*  --   MONOABS 0.6  --   EOSABS 0.1  --   BASOSABS 0.0  --     Chemistries  Recent Labs  Lab 10/29/19 1707 10/30/19 0630  NA 137 136  K 3.0* 3.8  CL 97* 96*  CO2 24 25  GLUCOSE 84 124*  BUN 27* 29*  CREATININE 2.80* 2.67*  CALCIUM 8.5* 8.5*  MG  --  1.9  AST 24  --   ALT 9  --   ALKPHOS 89  --   BILITOT 1.8*  --    ------------------------------------------------------------------------------------------------------------------ Recent Labs    10/29/19 1726  TRIG 40    Lab Results  Component Value Date   HGBA1C 5.4 10/08/2019   ------------------------------------------------------------------------------------------------------------------ No results for input(s): TSH, T4TOTAL, T3FREE, THYROIDAB in the last 72 hours.  Invalid input(s): FREET3 ------------------------------------------------------------------------------------------------------------------ Recent Labs    10/29/19 1726  FERRITIN 9*    Coagulation profile No results for input(s): INR, PROTIME in the last 168 hours.  Recent Labs    10/29/19 1726  DDIMER 2.70*    Cardiac Enzymes No results for input(s): CKMB, TROPONINI, MYOGLOBIN in the last 168 hours.  Invalid input(s): CK ------------------------------------------------------------------------------------------------------------------    Component Value Date/Time   BNP 1,507.0 (H) 10/29/2019 1707     Roxan Hockey M.D on 10/30/2019 at 6:27 PM  Go to [email or web address] - for contact info  [facility] - Office  [phone]

## 2019-10-30 NOTE — Plan of Care (Signed)
  Problem: Acute Rehab PT Goals(only PT should resolve) Goal: Pt Will Ambulate Outcome: Progressing Flowsheets (Taken 10/30/2019 0954) Pt will Ambulate:  75 feet  with modified independence  with cane Note: Or without AD if safe   9:55 AM, 10/30/19 Lonell Grandchild, MPT Physical Therapist with [facility] [phone] office [phone] mobile phone

## 2019-10-31 ENCOUNTER — Encounter (HOSPITAL_COMMUNITY)
Admission: EM | Disposition: A | Discharge status: Home / Self Care | Payer: Self-pay | Source: Home / Self Care | Attending: Internal Medicine

## 2019-10-31 ENCOUNTER — Encounter (HOSPITAL_COMMUNITY): Payer: Self-pay | Admitting: Internal Medicine

## 2019-10-31 DIAGNOSIS — Z7901 Long term (current) use of anticoagulants: Secondary | ICD-10-CM

## 2019-10-31 DIAGNOSIS — R451 Restlessness and agitation: Secondary | ICD-10-CM

## 2019-10-31 DIAGNOSIS — K922 Gastrointestinal hemorrhage, unspecified: Secondary | ICD-10-CM

## 2019-10-31 DIAGNOSIS — Z86010 Personal history of colonic polyps: Secondary | ICD-10-CM

## 2019-10-31 DIAGNOSIS — N183 Chronic kidney disease, stage 3 unspecified: Secondary | ICD-10-CM

## 2019-10-31 DIAGNOSIS — D62 Acute posthemorrhagic anemia: Secondary | ICD-10-CM

## 2019-10-31 HISTORY — PX: GIVENS CAPSULE STUDY: SHX5432

## 2019-10-31 LAB — BASIC METABOLIC PANEL
Anion gap: 13 (ref 5–15)
BUN: 38 mg/dL — ABNORMAL HIGH (ref 8–23)
CO2: 26 mmol/L (ref 22–32)
Calcium: 8.9 mg/dL (ref 8.9–10.3)
Chloride: 98 mmol/L (ref 98–111)
Creatinine, Ser: 2.9 mg/dL — ABNORMAL HIGH (ref 0.44–1.00)
GFR calc Af Amer: 17 mL/min — ABNORMAL LOW (ref >60)
GFR calc non Af Amer: 15 mL/min — ABNORMAL LOW (ref >60)
Glucose, Bld: 99 mg/dL (ref 70–99)
Potassium: 3.6 mmol/L (ref 3.5–5.1)
Sodium: 137 mmol/L (ref 135–145)

## 2019-10-31 LAB — CBC
HCT: 25.8 % — ABNORMAL LOW (ref 36.0–46.0)
Hemoglobin: 7.6 g/dL — ABNORMAL LOW (ref 12.0–15.0)
MCH: 24.4 pg — ABNORMAL LOW (ref 26.0–34.0)
MCHC: 29.5 g/dL — ABNORMAL LOW (ref 30.0–36.0)
MCV: 83 fL (ref 80.0–100.0)
Platelets: 279 10*3/uL (ref 150–400)
RBC: 3.11 MIL/uL — ABNORMAL LOW (ref 3.87–5.11)
RDW: 19.7 % — ABNORMAL HIGH (ref 11.5–15.5)
WBC: 12.8 10*3/uL — ABNORMAL HIGH (ref 4.0–10.5)
nRBC: 0 % (ref 0.0–0.2)

## 2019-10-31 SURGERY — IMAGING PROCEDURE, GI TRACT, INTRALUMINAL, VIA CAPSULE

## 2019-10-31 MED ORDER — SODIUM CHLORIDE 0.9 % IV SOLN
510.0000 mg | Freq: Once | INTRAVENOUS | Status: AC
  Administered 2019-10-31: 510 mg via INTRAVENOUS
  Filled 2019-10-31: qty 510

## 2019-10-31 MED ORDER — LORAZEPAM 2 MG/ML IJ SOLN
0.5000 mg | Freq: Once | INTRAMUSCULAR | Status: AC | PRN
  Administered 2019-10-31: 0.5 mg via INTRAVENOUS
  Filled 2019-10-31: qty 1

## 2019-10-31 MED ORDER — LORAZEPAM 2 MG/ML IJ SOLN
0.5000 mg | Freq: Once | INTRAMUSCULAR | Status: AC
  Administered 2019-10-31: 0.5 mg via INTRAVENOUS
  Filled 2019-10-31: qty 1

## 2019-10-31 MED ORDER — MAGNESIUM SULFATE 2 GM/50ML IV SOLN
2.0000 g | Freq: Once | INTRAVENOUS | Status: AC
  Administered 2019-10-31: 2 g via INTRAVENOUS
  Filled 2019-10-31: qty 50

## 2019-10-31 MED ORDER — POTASSIUM CHLORIDE 10 MEQ/100ML IV SOLN
10.0000 meq | INTRAVENOUS | Status: AC
  Administered 2019-10-31: 10 meq via INTRAVENOUS
  Filled 2019-10-31: qty 100

## 2019-10-31 NOTE — Progress Notes (Signed)
Patient continues to be combative and agitated. She is getting out of bed multiple times while pulling the pump across her bed, she has consistently ripped off her tele monitor >12 times in the last 2hrs. I have again informed floor coverage of behaviors. She poses a great risk for falls. Bed alarm has been initiated and I continue to be close to room to help reduce risk for fall/injury

## 2019-10-31 NOTE — Progress Notes (Signed)
Patient continues to be restless and confused. She has pulled 2 IV's out in 0700. MD notified.Mitts applied per ordered. One on one sitter request in.

## 2019-10-31 NOTE — Progress Notes (Signed)
Patient Demographics:    Bailey Bishop, is a 79 y.o. female, DOB - 1940/09/04, QBH:[id number]  Admit date - 10/29/2019   Admitting Physician Albertine Patricia, MD  Outpatient Primary MD for the patient is [name], MD  LOS - 2   Chief Complaint  Patient presents with  . Shortness of Breath        Subjective:    Bailey Bishop patient was agitated overnight and required multiple doses of Ativan.  She is somnolent this morning, but is calm and wakes up to voice and answers questions appropriately.  Assessment  & Plan :    Active Problems:   Essential hypertension   Anticoagulated   COPD (chronic obstructive pulmonary disease) (HCC)   Atrial fibrillation (HCC)   Diverticulosis of large intestine without diverticulitis   CKD (chronic kidney disease), stage IIIb   GI bleed   Fever   Acute blood loss anemia  Brief Summary:- 79 y.o. female with medical history significant ofA. Fib (on eliquis), systolic HF with EF 09-73%, GERD, anxiety, HTN, CKD stage 3b, hx of right breast cancer status post bilateral mastectomy, asthma admitted on 10/29/2019 with fatigue, dizziness, fevers, generalized weakness and Hemoccult positive stool with hemoglobin down to 6.4   A/p 1Acute on chronic symptomatic Anemia/acute GI bleed---suspect chronic anemia of CKD worsened by acute blood loss/GI bleed --patient is a Jehovah witness and declines transfusion Hgb 6.4 >>7.6 (w/o transfusion)---patient did receive Aranesp -patient underwent a recent EGD and colonoscopy on 09/06/2019 - EGD showed Schatzki's ring s/p dilation, gastric erosions - biopsies showed chronic gastritis and GIM neg for H. pylori. Colonoscopy showed - Eight 4 to 8 mm polyps in the sigmoid colon, in the descending colon, at the hepatic flexure and in the cecum, one 13 mm polyp in the descending colon (some TA). No evidence of any lesions to explain  anemia were present during endoluminal evaluation on 09/06/2019 -GI consult appreciated, attempted capsule endoscopy on 8/7 unsuccessful since patient regurgitated capsule -Eliquis on hold -Given chronic anemia in a patient with CKD-May benefit from Procrit/ESA injections every couple of weeks -Okay to change IV Protonix to p.o. -Patient has been agreeable to iron infusion  2)-HFrEF--acute on chronic combined systolicand diastolicheart failure -Echo from June 2021 with EF of 35 to 40%  and severe global hypokinesis -Continue bisoprolol to 2.5 mg daily  -unable to use ACE/ARB given worsening renal function and soft BP. -Restart Lasix at 20 mg daily upon discharge home -Patient has appointment with Dr. Aundra Dubin at the heart failure clinic on 11/02/2019 at 240 pm--- she is hoping to be discharged prior to that so she can make an appointment  3-history of atrial fibrillation -Rate controlled and stable -Okay to hold Eliquis/Apixaban for now, upon discharge home consider discharging on Eliquis 2.5 mg twice daily for secondary prevention as long as he is hemodynamically stable and no concerns about ongoing GI bleed -Previously was using verapamil;switched over tobisoprolol 2.5 mg dailydue to low EF of 35 to 40%   4-Fevers--had fevers above 103 on admission UA and chest x-ray without definite infection, blood cultures NGTD, Flagyl has been discontinued.  Since blood cultures are negative at 48 hours and no further fevers, will discontinue further antibiotics and monitor.  5-history of asthma -No wheezing currently -Continue monitoring. -Continue as needed bronchodilator nebulizer tx.  6--AKI----acute kidney injury on CKD stage -IIIb--worsening renal function appears to be due to diuresis/anemia -creatinine on admission=2.80, baseline creatinine = 1.4 to 1.6, creatinine is now= 2.9 --renally adjust medications, avoid nephrotoxic agents / dehydration / hypotension   7-history  of breast cancer -Status post bilateral mastectomy -Continue surveillance as an outpatient  8-history of anxiety -stable mood -Continue as needed Xanax.  9-social/ethics.  Advance care planning discussion with patient and husband -Agree to DNR status  10-acute encephalopathy.  Unclear etiology.  Repeat urinalysis.  She did require Ativan/Haldol overnight.  Appears to be calm at this time.  Possible hospital psychosis versus sundowning.  QTC noted to be prolonged on initial EKG.  Will repeat to reassess.  If QTC has normalized, can consider Seroquel nightly.   Code Status:DNR Family Communication:Husband at bedside Disposition: Home  Disposition/Need for in-Hospital Stay- patient unable to be discharged at this time due to --symptomatic anemia with dizziness and dyspnea on exertion requiring further endoluminal evaluation   Status is: Inpatient  Remains inpatient appropriate because:symptomatic anemia with dizziness and dyspnea on exertion requiring further endoluminal evaluation   Disposition: The patient is from: Home              Anticipated d/c is to: Home              Anticipated d/c date is: 2 days              Patient currently is not medically stable to d/c. Barriers: Not Clinically Stable- -symptomatic anemia with dizziness and dyspnea on exertion requiring further endoluminal evaluation   Code Status : DNR  Family Communication:   (patient is alert, awake and coherent)  --Discussed with husband at bedside  Consults  :  Gi consult  DVT Prophylaxis  :    - SCDs -Eliquis on hold due to GI bleed  Lab Results  Component Value Date   PLT 279 10/31/2019    Inpatient Medications  Scheduled Meds: . bisoprolol  2.5 mg Oral Daily  . pantoprazole  40 mg Oral BID AC  . potassium chloride SA  20 mEq Oral Daily   Continuous Infusions: . ceFEPime (MAXIPIME) IV 2 g (10/30/19 2114)  . vancomycin     PRN Meds:.ondansetron **OR** ondansetron (ZOFRAN)  IV    Anti-infectives (From admission, onward)   Start     Dose/Rate Route Frequency Ordered Stop   10/31/19 2200  vancomycin (VANCOCIN) IVPB 1000 mg/200 mL premix     Discontinue     1,000 mg 200 mL/hr over 60 Minutes Intravenous Every 48 hours 10/29/19 2009     10/30/19 2000  ceFEPIme (MAXIPIME) 2 g in sodium chloride 0.9 % 100 mL IVPB     Discontinue     2 g 200 mL/hr over 30 Minutes Intravenous Every 24 hours 10/29/19 2009     10/30/19 0600  metroNIDAZOLE (FLAGYL) IVPB 500 mg  Status:  Discontinued        500 mg 100 mL/hr over 60 Minutes Intravenous Every 8 hours 10/29/19 2016 10/30/19 1850   10/29/19 1930  ceFEPIme (MAXIPIME) 2 g in sodium chloride 0.9 % 100 mL IVPB        2 g 200 mL/hr over 30 Minutes Intravenous  Once 10/29/19 1917 10/29/19 2121   10/29/19 1930  metroNIDAZOLE (FLAGYL) IVPB 500 mg        500 mg 100 mL/hr over 60 Minutes Intravenous  Once 10/29/19 1917 10/29/19 2111   10/29/19 1930  vancomycin (VANCOCIN) IVPB 1000 mg/200 mL premix        1,000 mg 200 mL/hr over 60 Minutes Intravenous  Once 10/29/19 1917 10/29/19 2248        Objective:   Vitals:   10/30/19 2221 10/31/19 0741 10/31/19 1336 10/31/19 1500  BP: (!) 109/53  113/68 (!) 114/52  Pulse: 72  81 73  Resp: 20  18   Temp: 98.2 F (36.8 C)  98 F (36.7 C)   TempSrc: Oral  Oral   SpO2: 98%  96%   Weight:  66.9 kg    Height:  5\' 4"  (1.626 m)      Wt Readings from Last 3 Encounters:  10/31/19 66.9 kg  10/10/19 68.9 kg  09/29/19 69.2 kg     Intake/Output Summary (Last 24 hours) at 10/31/2019 1953 Last data filed at 10/31/2019 1730 Gross per 24 hour  Intake 920 ml  Output --  Net 920 ml     Physical Exam  General exam: Somnolent, calm, no distress Respiratory system: Diminished breath sounds at bases. Respiratory effort normal. Cardiovascular system:RRR. No murmurs, rubs, gallops. Gastrointestinal system: Abdomen is nondistended, soft and nontender. No organomegaly or masses felt. Normal  bowel sounds heard. Central nervous system: Alert and oriented. No focal neurological deficits. Extremities: 1+ edema bilaterally Skin: No rashes, lesions or ulcers Psychiatry: Somnolent, but wakes up to voice and answers questions.    Data Review:   Micro Results Recent Results (from the past 240 hour(s))  SARS Coronavirus 2 by RT PCR (hospital order, performed in [facility] hospital lab) Nasopharyngeal Nasopharyngeal Swab     Status: None   Collection Time: 10/29/19  5:26 PM   Specimen: Nasopharyngeal Swab  Result Value Ref Range Status   SARS Coronavirus 2 NEGATIVE NEGATIVE Final    Comment: (NOTE) SARS-CoV-2 target nucleic acids are NOT DETECTED.  The SARS-CoV-2 RNA is generally detectable in upper and lower respiratory specimens during the acute phase of infection. The lowest concentration of SARS-CoV-2 viral copies this assay can detect is 250 copies / mL. A negative result does not preclude SARS-CoV-2 infection and should not be used as the sole basis for treatment or other patient management decisions.  A negative result may occur with improper specimen collection / handling, submission of specimen other than nasopharyngeal swab, presence of viral mutation(s) within the areas targeted by this assay, and inadequate number of viral copies (<250 copies / mL). A negative result must be combined with clinical observations, patient history, and epidemiological information.  Fact Sheet for Patients:   StrictlyIdeas.no  Fact Sheet for Healthcare Providers: BankingDealers.co.za  This test is not yet approved or  cleared by the [place] FDA and has been authorized for detection and/or diagnosis of SARS-CoV-2 by FDA under an Emergency Use Authorization (EUA).  This EUA will remain in effect (meaning this test can be used) for the duration of the COVID-19 declaration under Section 564(b)(1) of the Act, 21 U.S.C. section  360bbb-3(b)(1), unless the authorization is terminated or revoked sooner.  Performed at [facility], [address], [place], [place] [address]   Blood Culture (routine x 2)     Status: None (Preliminary result)   Collection Time: 10/29/19  5:26 PM   Specimen: BLOOD LEFT FOREARM  Result Value Ref Range Status   Specimen Description BLOOD LEFT FOREARM  Final   Special Requests   Final    BOTTLES DRAWN AEROBIC AND ANAEROBIC  Blood Culture adequate volume   Culture   Final    NO GROWTH 2 DAYS Performed at [facility], [address], [place], [place] [address]    Report Status PENDING  Incomplete  Blood Culture (routine x 2)     Status: None (Preliminary result)   Collection Time: 10/29/19  5:56 PM   Specimen: Left Antecubital; Blood  Result Value Ref Range Status   Specimen Description LEFT ANTECUBITAL  Final   Special Requests   Final    BOTTLES DRAWN AEROBIC AND ANAEROBIC Blood Culture adequate volume   Culture   Final    NO GROWTH 2 DAYS Performed at [facility], [address] [place], [place] [address]    Report Status PENDING  Incomplete    Radiology Reports DG Chest 2 View  Result Date: 10/09/2019 CLINICAL DATA:  Shortness of breath, peripheral edema, chronic kidney disease EXAM: CHEST - 2 VIEW COMPARISON:  09/23/2019 FINDINGS: Stable cardiomegaly with mild vascular congestion. No significant developing edema pattern. Similar chronic perihilar and lower lobe bandlike opacities compatible with atelectasis and or scarring. No significant interval change in aeration. No definite superimposed pneumonia, significant collapse or consolidation. Small pleural effusions remain. Aorta atherosclerotic. Trachea is midline. Chronic appearing compression fractures of the lower thoracic spine. IMPRESSION: Stable chronic chest findings with cardiomegaly, vascular congestion, and similar pattern of bilateral atelectasis/scarring. Persistent small pleural effusions No new process by plain  radiography Aortic Atherosclerosis (ICD10-I70.0). Electronically Signed   By: Jerilynn Mages.  Shick M.D.   On: 10/09/2019 15:12   US RENAL  Result Date: 10/09/2019 CLINICAL DATA:  Chronic kidney disease stage 4, hypertension, previous smoker EXAM: RENAL / URINARY TRACT ULTRASOUND COMPLETE COMPARISON:  04/13/2019 FINDINGS: Right Kidney: Renal measurements: 8.4 x 4.2 x 3.6 cm = volume: 66 mL . Echogenicity within normal limits. No mass or hydronephrosis visualized. Left Kidney: Renal measurements: 9.0 x 5.1 x 3.4 cm = volume: 80 mL. Left kidney lower pole anechoic cyst measures 3.6 x 2.9 x 3.4 cm. This correlates with the comparison CT. Bladder: Appears normal for degree of bladder distention. Other: None. IMPRESSION: No acute renal abnormality by ultrasound. Negative for hydronephrosis. 3.6 cm left kidney lower pole cyst. Electronically Signed   By: Jerilynn Mages.  Shick M.D.   On: 10/09/2019 15:15   DG Chest Port 1 View  Result Date: 10/29/2019 CLINICAL DATA:  Shortness of breath and fever today. EXAM: PORTABLE CHEST 1 VIEW COMPARISON:  Most recent radiograph 10/10/2019. Most recent CT 09/05/2019. FINDINGS: Stable cardiomegaly. Interstitial coarsening appears similar to prior exam. Areas of scarring in both lungs. Suspected small right pleural effusion. Linear atelectasis in the left mid lung versus fluid in the left inter lobar fissure. No focal airspace disease to suggest pneumonia. No pneumothorax. Bones are under mineralized. IMPRESSION: 1. Stable cardiomegaly. 2. Chronic interstitial coarsening. Probable small right pleural effusion. 3. No radiographic evidence of pneumonia. Electronically Signed   By: Keith Rake M.D.   On: 10/29/2019 17:35   DG Chest Port 1 View  Result Date: 10/10/2019 CLINICAL DATA:  Shortness of breath. EXAM: PORTABLE CHEST 1 VIEW COMPARISON:  10/08/2019 FINDINGS: Stable enlargement of the heart. Coarse lung markings are suggestive for chronic changes. Trachea is midline. Atherosclerotic  calcifications at the aortic arch. Surgical clips in the right upper chest and axillary region. No focal airspace disease or pulmonary edema. IMPRESSION: Chronic lung changes without acute findings. Electronically Signed   By: Markus Daft M.D.   On: 10/10/2019 09:54     CBC Recent  Labs  Lab 10/29/19 1707 10/30/19 0630 10/31/19 0744  WBC 9.8 10.3 12.8*  HGB 6.4* 7.2* 7.6*  HCT 21.7* 24.2* 25.8*  PLT 198 193 279  MCV 84.1 83.4 83.0  MCH 24.8* 24.8* 24.4*  MCHC 29.5* 29.8* 29.5*  RDW 19.3* 19.5* 19.7*  LYMPHSABS 0.5*  --   --   MONOABS 0.6  --   --   EOSABS 0.1  --   --   BASOSABS 0.0  --   --     Chemistries  Recent Labs  Lab 10/29/19 1707 10/30/19 0630 10/31/19 0744  NA 137 136 137  K 3.0* 3.8 3.6  CL 97* 96* 98  CO2 24 25 26   GLUCOSE 84 124* 99  BUN 27* 29* 38*  CREATININE 2.80* 2.67* 2.90*  CALCIUM 8.5* 8.5* 8.9  MG  --  1.9  --   AST 24  --   --   ALT 9  --   --   ALKPHOS 89  --   --   BILITOT 1.8*  --   --    ------------------------------------------------------------------------------------------------------------------ Recent Labs    10/29/19 1726  TRIG 40    Lab Results  Component Value Date   HGBA1C 5.4 10/08/2019   ------------------------------------------------------------------------------------------------------------------ No results for input(s): TSH, T4TOTAL, T3FREE, THYROIDAB in the last 72 hours.  Invalid input(s): FREET3 ------------------------------------------------------------------------------------------------------------------ Recent Labs    10/29/19 1726  FERRITIN 9*    Coagulation profile No results for input(s): INR, PROTIME in the last 168 hours.  Recent Labs    10/29/19 1726  DDIMER 2.70*    Cardiac Enzymes No results for input(s): CKMB, TROPONINI, MYOGLOBIN in the last 168 hours.  Invalid input(s):  CK ------------------------------------------------------------------------------------------------------------------    Component Value Date/Time   BNP 1,507.0 (H) 10/29/2019 1707     Kathie Dike M.D on 10/31/2019 at 7:53 PM  Go to [email or web address] - for contact info  [facility] - Office  [phone]

## 2019-10-31 NOTE — Progress Notes (Addendum)
Patient has become confused and combative. She has been jumping out of bed and attempting to walk down the hallway. She is continually removing tele from attachment. I have informed floor coverage of patients  above behaviors. Anitvan 0.5mg  IV has been given as ordered. I will continue to monitor patient

## 2019-10-31 NOTE — Progress Notes (Signed)
[facility] night shift.  The staff reports that the patient has been restless, confused and combative.  She has been trying to get out of bed and attempted to walk in the hallway.  Please see notes from Maryjean Morn, RN for further details.  Ativan 0.5 mg IVP x2 administered 0233 and 0358.  One-to-one sitter requested.  Tennis Must, MD

## 2019-10-31 NOTE — Progress Notes (Signed)
Arrived to patients room to administer Givens Capsule. Patient was sitting with primary nurse and both given instructions about procedure. Both verbalized understanding. Patient attempted to swallow capsule with Mylicon water. After thought ingestion of capsule, patient was stood up to standing position with balance difficulty and slurred speech. Patient was escorted to bed with this nurse and primary nurse. After getting patient in the bed, found the Givens Capsule in patients hand. Patient began to shake, unresponsive. Applied nasal cannula. O2 saturation was 86% after application of nasal cannula. Blood pressure machine connected to patient and blood pressure initiated. Primary nurse called a rapid response. Rapid team at bedside. Dr. Laural Golden was notified of attempted Givens Capsule administration. Verbal order to discontinue Givens Capsule study.

## 2019-10-31 NOTE — Progress Notes (Addendum)
Subjective:  According to patient's husband she was fine last night but became confused this morning.  He states she had similar state when he called ambulance 2 days ago.  When she was seen by Endo nurses Ms. Lurline Del, RN and [employer] RN she appeared to be stable and responded appropriately to questions.  She was therefore advised to swallow given capsule.  Apparently she did swallow it but then she regurgitated.  By now she was confused and tremulous and therefore capsule was not given to her again. No melena or rectal bleeding reported according to nursing staff. Husband states that her swallowing improved for few days after esophageal dilation about 7 weeks ago.  She complains of dysphagia and points to upper mid sternal area site of bolus obstruction and also complains of early satiety.  Current Medications:  Current Facility-Administered Medications:  .  [MAR Hold] bisoprolol (ZEBETA) tablet 2.5 mg, 2.5 mg, Oral, Daily, Elgergawy, Silver Huguenin, MD, 2.5 mg at 10/30/19 0912 .  [MAR Hold] ceFEPIme (MAXIPIME) 2 g in sodium chloride 0.9 % 100 mL IVPB, 2 g, Intravenous, Q24H, Elgergawy, Silver Huguenin, MD, Last Rate: 200 mL/hr at 10/30/19 2114, 2 g at 10/30/19 2114 .  [MAR Hold] ondansetron (ZOFRAN) tablet 4 mg, 4 mg, Oral, Q6H PRN **OR** [MAR Hold] ondansetron (ZOFRAN) injection 4 mg, 4 mg, Intravenous, Q6H PRN, Elgergawy, Silver Huguenin, MD .  Doug Sou Hold] pantoprazole (PROTONIX) EC tablet 40 mg, 40 mg, Oral, BID AC, Emokpae, Courage, MD .  Doug Sou Hold] potassium chloride SA (KLOR-CON) CR tablet 20 mEq, 20 mEq, Oral, Daily, Elgergawy, Silver Huguenin, MD, 20 mEq at 10/30/19 0912 .  [MAR Hold] vancomycin (VANCOCIN) IVPB 1000 mg/200 mL premix, 1,000 mg, Intravenous, Q48H, Elgergawy, Silver Huguenin, MD   Objective: Blood pressure (!) 109/53, pulse 72, temperature 98.2 F (36.8 C), temperature source Oral, resp. rate 20, height _0  (1.626 m), weight 66.9 kg, SpO2 98 %. Patient is sleeping in a dark room with a sitter  and her husband by her side. I did not attempt to wake her up. Her breathing rate does not appear to be rapid.   Labs/studies Results:  CBC Latest Ref Rng & Units 10/31/2019 10/30/2019 10/29/2019  WBC 4.0 - 10.5 K/uL 12.8(H) 10.3 9.8  Hemoglobin 12.0 - 15.0 g/dL 7.6(L) 7.2(L) 6.4(LL)  Hematocrit 36 - 46 % 25.8(L) 24.2(L) 21.7(L)  Platelets 150 - 400 K/uL 279 193 198    CMP Latest Ref Rng & Units 10/31/2019 10/30/2019 10/29/2019  Glucose 70 - 99 mg/dL 99 124(H) 84  BUN 8 - 23 mg/dL 38(H) 29(H) 27(H)  Creatinine 0.44 - 1.00 mg/dL 2.90(H) 2.67(H) 2.80(H)  Sodium 135 - 145 mmol/L 137 136 137  Potassium 3.5 - 5.1 mmol/L 3.6 3.8 3.0(L)  Chloride 98 - 111 mmol/L 98 96(L) 97(L)  CO2 22 - 32 mmol/L _1 Calcium 8.9 - 10.3 mg/dL 8.9 8.5(L) 8.5(L)  Total Protein 6.5 - 8.1 g/dL - - 7.3  Total Bilirubin 0.3 - 1.2 mg/dL - - 1.8(H)  Alkaline Phos 38 - 126 U/L - - 89  AST 15 - 41 U/L - - 24  ALT 0 - 44 U/L - - 9    Hepatic Function Latest Ref Rng & Units 10/29/2019 10/08/2019 09/03/2019  Total Protein 6.5 - 8.1 g/dL 7.3 7.5 7.3  Albumin 3.5 - 5.0 g/dL 3.5 3.8 3.5  AST 15 - 41 U/L _2 ALT 0 - 44 U/L _3 Alk Phosphatase 38 -  126 U/L 89 99 123  Total Bilirubin 0.3 - 1.2 mg/dL 1.8(H) 1.1 1.4(H)  Bilirubin, Direct 0.1 - 0.5 mg/dL - - -   Serum ferritin 9 on 10/29/2019 and on 10/08/2019. Blood cultures negative on day 2.  Assessment:  #1.  Iron deficiency anemia secondary to GI bleed which appears to be primarily occult although she has had hematochezia in the past.  She is chronically anticoagulated for atrial fibrillation.  She had recent EGD and colonoscopy by Dr. Gala Romney on 09/06/2019.  She had erosive gastropathy without stigmata of bleed and biopsy was negative for H. Pylori.  Colonoscopy revealed sigmoid diverticulosis and multiple colonic adenomas.  We felt she could be losing blood from small bowel.  Given capsule study was attempted this morning but she was not able to swallow capsule.   Therefore study not possible. Hemoglobin is coming up.  It was 6.4 on admission and now 7.6.  She received single dose of Aranesp.  She would benefit from parenteral iron.  Patient's husband has some concerns about iron infusion.  Would consider iron infusion if he agrees when patient is back to her baseline regarding mentation.  #2.  Esophageal dysphagia.  Patient remains with dysphagia despite recent esophageal dilation for nonobstructive Schatzki's ring.  Suspect esophageal motility disorder.  #3.  Febrile illness and lactic acidosis.  Blood cultures are negative.  Patient is on empiric antibiotic therapy.  #4.  Confusion.  Patient became confused this morning.  She is now sleeping.  #5.  History of CHF.  She was short of breath on admission.  Shortness of breath would appear to be due to acute on chronic anemia and CHF.  Chest film on admission revealed stable cardiomyopathy small right pleural effusion and interstitial coarsening unchanged from previous exam.  #6.  Atrial fibrillation.  Anticoagulant/apixaban on hold.  #7.  Chronic kidney disease.  Renal function is close to baseline.  Recommendations  Resume heart healthy diet.  May change to mechanical soft letter. CBC in a.m. Would consider parenteral iron if her husband agrees.

## 2019-10-31 NOTE — Progress Notes (Signed)
Patient has 4 runs of Potassium ordered. New IV started and flushed without difficulty. Halfway into 1st run of Potassium she began c/o of pain. IV site WNL and flushes without difficulty. Turned rate of Potassium down and patient continues to c/o pain with Potassium. IV Potassium stopped.  Mid-level paged. Will continue to monitor.

## 2019-11-01 ENCOUNTER — Encounter (HOSPITAL_COMMUNITY): Payer: Self-pay | Admitting: Internal Medicine

## 2019-11-01 LAB — CBC
HCT: 25.7 % — ABNORMAL LOW (ref 36.0–46.0)
Hemoglobin: 7.5 g/dL — ABNORMAL LOW (ref 12.0–15.0)
MCH: 24.2 pg — ABNORMAL LOW (ref 26.0–34.0)
MCHC: 29.2 g/dL — ABNORMAL LOW (ref 30.0–36.0)
MCV: 82.9 fL (ref 80.0–100.0)
Platelets: 246 10*3/uL (ref 150–400)
RBC: 3.1 MIL/uL — ABNORMAL LOW (ref 3.87–5.11)
RDW: 19.7 % — ABNORMAL HIGH (ref 11.5–15.5)
WBC: 7.7 10*3/uL (ref 4.0–10.5)
nRBC: 0 % (ref 0.0–0.2)

## 2019-11-01 LAB — BASIC METABOLIC PANEL
Anion gap: 15 (ref 5–15)
BUN: 41 mg/dL — ABNORMAL HIGH (ref 8–23)
CO2: 26 mmol/L (ref 22–32)
Calcium: 8.9 mg/dL (ref 8.9–10.3)
Chloride: 98 mmol/L (ref 98–111)
Creatinine, Ser: 2.77 mg/dL — ABNORMAL HIGH (ref 0.44–1.00)
GFR calc Af Amer: 18 mL/min — ABNORMAL LOW (ref >60)
GFR calc non Af Amer: 15 mL/min — ABNORMAL LOW (ref >60)
Glucose, Bld: 93 mg/dL (ref 70–99)
Potassium: 3.5 mmol/L (ref 3.5–5.1)
Sodium: 139 mmol/L (ref 135–145)

## 2019-11-01 MED ORDER — POTASSIUM CHLORIDE 2 MEQ/ML IV SOLN
INTRAVENOUS | Status: AC
  Filled 2019-11-01: qty 1000

## 2019-11-01 MED ORDER — LOPERAMIDE HCL 2 MG PO CAPS
2.0000 mg | ORAL_CAPSULE | Freq: Two times a day (BID) | ORAL | Status: DC
  Administered 2019-11-01 – 2019-11-02 (×3): 2 mg via ORAL
  Filled 2019-11-01 (×3): qty 1

## 2019-11-01 NOTE — Progress Notes (Signed)
Subjective:  Patient complains of diarrhea.  She says she has had diarrhea intermittently at home for the past few months and when it occurs she takes Imodium which helps.  She denies abdominal pain melena or rectal bleeding.  She does complain of problem because of diarrhea.  Her appetite is fair.  She ate all of her breakfast but no more than one third of her lunch.  She did not experience dysphagia today. Patient says she does not remember anything about yesterday morning. She reports no shortness of breath or itching with iron infusion. She complains of perineal irritation secondary diarrhea.  She is using barrier cream.  Current Medications:  Current Facility-Administered Medications:  .  bisoprolol (ZEBETA) tablet 2.5 mg, 2.5 mg, Oral, Daily, Elgergawy, Silver Huguenin, MD, 2.5 mg at 11/01/19 0824 .  lactated ringers 1,000 mL with potassium chloride 40 mEq infusion, , Intravenous, Continuous, Memon, Jehanzeb, MD, Last Rate: 75 mL/hr at 11/01/19 1437, New Bag at 11/01/19 1437 .  loperamide (IMODIUM) capsule 2 mg, 2 mg, Oral, BID, Antione Obar U, MD, 2 mg at 11/01/19 1304 .  ondansetron (ZOFRAN) tablet 4 mg, 4 mg, Oral, Q6H PRN **OR** ondansetron (ZOFRAN) injection 4 mg, 4 mg, Intravenous, Q6H PRN, Elgergawy, Dawood S, MD .  pantoprazole (PROTONIX) EC tablet 40 mg, 40 mg, Oral, BID AC, Emokpae, Courage, MD, 40 mg at 11/01/19 0825 .  potassium chloride SA (KLOR-CON) CR tablet 20 mEq, 20 mEq, Oral, Daily, Elgergawy, Silver Huguenin, MD, 20 mEq at 11/01/19 0824   Objective: Blood pressure 114/65, pulse 64, temperature 98.5 F (36.9 C), temperature source Oral, resp. rate 20, height '5\' 4"'  (1.626 m), weight 66.9 kg, SpO2 97 %. Patient is alert and sitting in reclining chair. She does not appear to be in any distress. Conjunctivae is pale. Abdomen is full.  Bowel sounds are normal.  On palpation is soft and nontender with organomegaly or masses. No LE edema noted.   Labs/studies Results:   CBC  Latest Ref Rng & Units 11/01/2019 10/31/2019 10/30/2019  WBC 4.0 - 10.5 K/uL 7.7 12.8(H) 10.3  Hemoglobin 12.0 - 15.0 g/dL 7.5(L) 7.6(L) 7.2(L)  Hematocrit 36 - 46 % 25.7(L) 25.8(L) 24.2(L)  Platelets 150 - 400 K/uL 246 279 193    CMP Latest Ref Rng & Units 11/01/2019 10/31/2019 10/30/2019  Glucose 70 - 99 mg/dL 93 99 124(H)  BUN 8 - 23 mg/dL 41(H) 38(H) 29(H)  Creatinine 0.44 - 1.00 mg/dL 2.77(H) 2.90(H) 2.67(H)  Sodium 135 - 145 mmol/L 139 137 136  Potassium 3.5 - 5.1 mmol/L 3.5 3.6 3.8  Chloride 98 - 111 mmol/L 98 98 96(L)  CO2 22 - 32 mmol/L '26 26 25  ' Calcium 8.9 - 10.3 mg/dL 8.9 8.9 8.5(L)  Total Protein 6.5 - 8.1 g/dL - - -  Total Bilirubin 0.3 - 1.2 mg/dL - - -  Alkaline Phos 38 - 126 U/L - - -  AST 15 - 41 U/L - - -  ALT 0 - 44 U/L - - -    Hepatic Function Latest Ref Rng & Units 10/29/2019 10/08/2019 09/03/2019  Total Protein 6.5 - 8.1 g/dL 7.3 7.5 7.3  Albumin 3.5 - 5.0 g/dL 3.5 3.8 3.5  AST 15 - 41 U/L '24 22 22  ' ALT 0 - 44 U/L '9 13 10  ' Alk Phosphatase 38 - 126 U/L 89 99 123  Total Bilirubin 0.3 - 1.2 mg/dL 1.8(H) 1.1 1.4(H)  Bilirubin, Direct 0.1 - 0.5 mg/dL - - -   Serum ferritin  9 on 10/29/2019 and on 10/08/2019. Blood cultures negative on day 2.  Assessment:  #1.  Iron deficiency anemia secondary to GI bleed which appears to be primarily occult although she has had hematochezia in the past.  She is chronically anticoagulated for atrial fibrillation.  She had recent EGD and colonoscopy by Dr. Gala Romney on 09/06/2019.  She had erosive gastropathy without stigmata of bleed and biopsy was negative for H. Pylori.  Colonoscopy revealed sigmoid diverticulosis and multiple colonic adenomas.   Small bowel given capsule study was attempted yesterday but patient was not able to swallow given capsule which is the result of acute confusion.  She may be able to undergo this test on an outpatient basis.  She has received 510 mg of ferumoxytol without side effects.  She should get another dose next  week..  #2.  Esophageal dysphagia.  Suspect esophageal motility disorder.  No dysphagia experienced today.  #3.  Febrile illness and lactic acidosis.  Blood cultures remain negative.  Urinalysis did not suggest urinary tract infection.  Antibiotics have been discontinued.  #4.  Confusion.  Confusion has resolved.  Not sure if it is due to medications.  Apparently she has had spells like this at home.  #5.  History of CHF.  Patient appears to be in compensated state.  #6.  Atrial fibrillation.  Anticoagulant/apixaban on hold because of GI bleed.  #7.  Chronic kidney disease.  Renal function is improving  #8.  Diarrhea.  Doubt C. difficile colitis.  Antibiotics have been discontinued.  She has history of intermittent diarrhea easily controlled with loperamide.  If diarrhea persists will do studies to rule out C. difficile colitis.  Recommendations  CBC in a.m. Loperamide OTC 2 mg p.o. twice daily x4 doses. Consider another dose of ferumoxytol next week.

## 2019-11-01 NOTE — Progress Notes (Addendum)
Patient Demographics:    Bailey Bishop, is a 79 y.o. female, DOB - 18-Dec-1940, XVQ:[id number]  Admit date - 10/29/2019   Admitting Physician Albertine Patricia, MD  Outpatient Primary MD for the patient is [name], MD  LOS - 3   Chief Complaint  Patient presents with   Shortness of Breath        Subjective:    Bailey Bishop had some agitation overnight, but is currently sitting up in chair, calm, answer questions appropriately. She does not recollect her agitation overnight. She is dizzy on standing. She has had 4-5 loose bowel movements this morning. These were semi formed and were not liquid  Assessment  & Plan :    Active Problems:   Essential hypertension   Anticoagulated   COPD (chronic obstructive pulmonary disease) (HCC)   Atrial fibrillation (HCC)   Diverticulosis of large intestine without diverticulitis   CKD (chronic kidney disease), stage IIIb   GI bleed   Fever   Acute blood loss anemia  Brief Summary:- 79 y.o. female with medical history significant of A. Fib (on eliquis), systolic HF with EF 09-32%, GERD, anxiety, HTN, CKD stage 3b, hx of right breast cancer status post bilateral mastectomy, asthma admitted on 10/29/2019 with fatigue, dizziness, fevers, generalized weakness and Hemoccult positive stool with hemoglobin down to 6.4   A/p 1Acute on chronic symptomatic Anemia/acute GI bleed---suspect chronic anemia of CKD worsened by acute blood loss/GI bleed --patient is a Jehovah witness and declines transfusion Hgb 6.4 >>7.5 (w/o transfusion)---patient did receive Aranesp -patient underwent a recent EGD and colonoscopy on 09/06/2019 - EGD showed Schatzki's ring s/p dilation, gastric erosions - biopsies showed chronic gastritis and GIM neg for H. pylori. Colonoscopy showed - Eight 4 to 8 mm polyps in the sigmoid colon, in the descending colon, at the hepatic flexure and in the  cecum, one 13 mm polyp in the descending colon (some TA). No evidence of any lesions to explain anemia were present during endoluminal evaluation on 09/06/2019 -GI consult appreciated, attempted capsule endoscopy on 8/7 unsuccessful since patient regurgitated capsule -Eliquis on hold -Given chronic anemia in a patient with CKD-May benefit from Procrit/ESA injections every couple of weeks -Okay to change IV Protonix to p.o. -Received iron infusion on 8/7  2)-HFrEF--acute on chronic combined systolic and diastolic heart failure -Echo from June 2021 with EF of 35 to 40%  and severe global hypokinesis -Continue bisoprolol to 2.5 mg daily  -unable to use ACE/ARB given worsening renal function and soft BP. -Restart Lasix at 20 mg daily upon discharge home -Patient has appointment with Dr. Aundra Dubin at the heart failure clinic on 11/02/2019 at 240 pm--- she is hoping to be discharged prior to that so she can make an appointment    3-Permanent atrial fibrillation -Rate controlled and stable -discussed with Dr. Laural Golden and recommendations are to continue to hold anticoagulation until GI tract can be further evaluated and Hgb has had further chance to recover and is above 9 -Previously was using verapamil; switched over to bisoprolol 2.5 mg daily due to low EF of 35 to 40%    4-Fevers--had fevers above 103 on admission UA and chest x-ray without definite infection, blood cultures NGTD, Flagyl has been discontinued.  Since  blood cultures are negative at 48 hours and no further fevers, will discontinue further antibiotics and monitor.   5-history of asthma -No wheezing currently -Continue monitoring. -Continue as needed bronchodilator nebulizer tx.   6--AKI----acute kidney injury on CKD stage -  IIIb--worsening renal function appears to be due to diuresis/anemia - creatinine on admission=2.80  , baseline creatinine = 1.4 to 1.6   , creatinine is now= 2.9 -- renally adjust medications, avoid nephrotoxic  agents / dehydration  / hypotension -provide gentle hydration overnight and recheck labs in AM    7-history of breast cancer -Status post bilateral mastectomy -Continue surveillance as an outpatient   8-history of anxiety -stable mood -Continue as needed Xanax.  9-social/ethics.  Advance care planning discussion with patient and husband -Agree to DNR status  10-acute encephalopathy.  Unclear etiology.  No signs of UTI.  Appears to be calm at this time.  Possible hospital psychosis versus sundowning.  QTC noted to be prolonged, so would avoid antipsychotics     Code Status: DNR Family Communication: Husband at bedside Disposition:  Home  Disposition/Need for in-Hospital Stay- patient unable to be discharged at this time due to --symptomatic anemia with dizziness and dyspnea on exertion requiring further endoluminal evaluation   Status is: Inpatient  Remains inpatient appropriate because: symptomatic anemia with dizziness and dyspnea on exertion requiring further endoluminal evaluation   Disposition: The patient is from: Home              Anticipated d/c is to: Home              Anticipated d/c date is: 2 days              Patient currently is not medically stable to d/c. Barriers: Not Clinically Stable- -symptomatic anemia with dizziness and dyspnea on exertion requiring further endoluminal evaluation   Code Status : DNR  Family Communication:   (patient is alert, awake and coherent)  --Discussed with husband at bedside  Consults  :  Gi consult  DVT Prophylaxis  :    - SCDs -Eliquis on hold due to GI bleed  Lab Results  Component Value Date   PLT 246 11/01/2019    Inpatient Medications  Scheduled Meds:  bisoprolol  2.5 mg Oral Daily   loperamide  2 mg Oral BID   pantoprazole  40 mg Oral BID AC   potassium chloride SA  20 mEq Oral Daily   Continuous Infusions:  lactated ringers with kcl 75 mL/hr at 11/01/19 1437   PRN Meds:.ondansetron **OR** ondansetron  (ZOFRAN) IV    Anti-infectives (From admission, onward)    Start     Dose/Rate Route Frequency Ordered Stop   10/31/19 2200  vancomycin (VANCOCIN) IVPB 1000 mg/200 mL premix  Status:  Discontinued        1,000 mg 200 mL/hr over 60 Minutes Intravenous Every 48 hours 10/29/19 2009 10/31/19 1955   10/30/19 2000  ceFEPIme (MAXIPIME) 2 g in sodium chloride 0.9 % 100 mL IVPB  Status:  Discontinued        2 g 200 mL/hr over 30 Minutes Intravenous Every 24 hours 10/29/19 2009 10/31/19 1955   10/30/19 0600  metroNIDAZOLE (FLAGYL) IVPB 500 mg  Status:  Discontinued        500 mg 100 mL/hr over 60 Minutes Intravenous Every 8 hours 10/29/19 2016 10/30/19 1850   10/29/19 1930  ceFEPIme (MAXIPIME) 2 g in sodium chloride 0.9 % 100 mL IVPB  2 g 200 mL/hr over 30 Minutes Intravenous  Once 10/29/19 1917 10/29/19 2121   10/29/19 1930  metroNIDAZOLE (FLAGYL) IVPB 500 mg        500 mg 100 mL/hr over 60 Minutes Intravenous  Once 10/29/19 1917 10/29/19 2111   10/29/19 1930  vancomycin (VANCOCIN) IVPB 1000 mg/200 mL premix        1,000 mg 200 mL/hr over 60 Minutes Intravenous  Once 10/29/19 1917 10/29/19 2248         Objective:   Vitals:   11/01/19 0452 11/01/19 1057 11/01/19 1100 11/01/19 1700  BP: 125/66 (!) 108/59 114/65 120/66  Pulse: 63 64  84  Resp: 20 20  19   Temp: 98.5 F (36.9 C) 98.5 F (36.9 C)  98.6 F (37 C)  TempSrc:  Oral  Oral  SpO2: 100% 97%  94%  Weight:      Height:        Wt Readings from Last 3 Encounters:  10/31/19 66.9 kg  10/10/19 68.9 kg  09/29/19 69.2 kg     Intake/Output Summary (Last 24 hours) at 11/01/2019 2011 Last data filed at 11/01/2019 1500 Gross per 24 hour  Intake 507 ml  Output --  Net 507 ml     Physical Exam  General exam: Alert, awake, oriented x 3 Respiratory system: Clear to auscultation. Respiratory effort normal. Cardiovascular system:RRR. No murmurs, rubs, gallops. Gastrointestinal system: Abdomen is nondistended, soft and  nontender. No organomegaly or masses felt. Normal bowel sounds heard. Central nervous system: Alert and oriented. No focal neurological deficits. Extremities: trace ti 1+ edema bilaterally Skin: No rashes, lesions or ulcers Psychiatry: Judgement and insight appear normal. Mood & affect appropriate.      Data Review:   Micro Results Recent Results (from the past 240 hour(s))  SARS Coronavirus 2 by RT PCR (hospital order, performed in [name] hospital lab) Nasopharyngeal Nasopharyngeal Swab     Status: None   Collection Time: 10/29/19  5:26 PM   Specimen: Nasopharyngeal Swab  Result Value Ref Range Status   SARS Coronavirus 2 NEGATIVE NEGATIVE Final    Comment: (NOTE) SARS-CoV-2 target nucleic acids are NOT DETECTED.  The SARS-CoV-2 RNA is generally detectable in upper and lower respiratory specimens during the acute phase of infection. The lowest concentration of SARS-CoV-2 viral copies this assay can detect is 250 copies / mL. A negative result does not preclude SARS-CoV-2 infection and should not be used as the sole basis for treatment or other patient management decisions.  A negative result may occur with improper specimen collection / handling, submission of specimen other than nasopharyngeal swab, presence of viral mutation(s) within the areas targeted by this assay, and inadequate number of viral copies (<250 copies / mL). A negative result must be combined with clinical observations, patient history, and epidemiological information.  Fact Sheet for Patients:   StrictlyIdeas.no  Fact Sheet for Healthcare Providers: BankingDealers.co.za  This test is not yet approved or  cleared by the [place] FDA and has been authorized for detection and/or diagnosis of SARS-CoV-2 by FDA under an Emergency Use Authorization (EUA).  This EUA will remain in effect (meaning this test can be used) for the duration of the COVID-19  declaration under Section 564(b)(1) of the Act, 21 U.S.C. section 360bbb-3(b)(1), unless the authorization is terminated or revoked sooner.  Performed at [facility], [address] [place], [place] [address]   Blood Culture (routine x 2)     Status: None (Preliminary result)  Collection Time: 10/29/19  5:26 PM   Specimen: BLOOD LEFT FOREARM  Result Value Ref Range Status   Specimen Description BLOOD LEFT FOREARM  Final   Special Requests   Final    BOTTLES DRAWN AEROBIC AND ANAEROBIC Blood Culture adequate volume   Culture   Final    NO GROWTH 2 DAYS Performed at [facility], [address] [place], [place] [address]    Report Status PENDING  Incomplete  Blood Culture (routine x 2)     Status: None (Preliminary result)   Collection Time: 10/29/19  5:56 PM   Specimen: Left Antecubital; Blood  Result Value Ref Range Status   Specimen Description LEFT ANTECUBITAL  Final   Special Requests   Final    BOTTLES DRAWN AEROBIC AND ANAEROBIC Blood Culture adequate volume   Culture   Final    NO GROWTH 2 DAYS Performed at [facility], [address] [place], [place] [address]    Report Status PENDING  Incomplete    Radiology Reports DG Chest 2 View  Result Date: 10/09/2019 CLINICAL DATA:  Shortness of breath, peripheral edema, chronic kidney disease EXAM: CHEST - 2 VIEW COMPARISON:  09/23/2019 FINDINGS: Stable cardiomegaly with mild vascular congestion. No significant developing edema pattern. Similar chronic perihilar and lower lobe bandlike opacities compatible with atelectasis and or scarring. No significant interval change in aeration. No definite superimposed pneumonia, significant collapse or consolidation. Small pleural effusions remain. Aorta atherosclerotic. Trachea is midline. Chronic appearing compression fractures of the lower thoracic spine. IMPRESSION: Stable chronic chest findings with cardiomegaly, vascular congestion, and similar pattern of bilateral  atelectasis/scarring. Persistent small pleural effusions No new process by plain radiography Aortic Atherosclerosis (ICD10-I70.0). Electronically Signed   By: Jerilynn Mages.  Shick M.D.   On: 10/09/2019 15:12   US RENAL  Result Date: 10/09/2019 CLINICAL DATA:  Chronic kidney disease stage 4, hypertension, previous smoker EXAM: RENAL / URINARY TRACT ULTRASOUND COMPLETE COMPARISON:  04/13/2019 FINDINGS: Right Kidney: Renal measurements: 8.4 x 4.2 x 3.6 cm = volume: 66 mL . Echogenicity within normal limits. No mass or hydronephrosis visualized. Left Kidney: Renal measurements: 9.0 x 5.1 x 3.4 cm = volume: 80 mL. Left kidney lower pole anechoic cyst measures 3.6 x 2.9 x 3.4 cm. This correlates with the comparison CT. Bladder: Appears normal for degree of bladder distention. Other: None. IMPRESSION: No acute renal abnormality by ultrasound. Negative for hydronephrosis. 3.6 cm left kidney lower pole cyst. Electronically Signed   By: Jerilynn Mages.  Shick M.D.   On: 10/09/2019 15:15   DG Chest Port 1 View  Result Date: 10/29/2019 CLINICAL DATA:  Shortness of breath and fever today. EXAM: PORTABLE CHEST 1 VIEW COMPARISON:  Most recent radiograph 10/10/2019. Most recent CT 09/05/2019. FINDINGS: Stable cardiomegaly. Interstitial coarsening appears similar to prior exam. Areas of scarring in both lungs. Suspected small right pleural effusion. Linear atelectasis in the left mid lung versus fluid in the left inter lobar fissure. No focal airspace disease to suggest pneumonia. No pneumothorax. Bones are under mineralized. IMPRESSION: 1. Stable cardiomegaly. 2. Chronic interstitial coarsening. Probable small right pleural effusion. 3. No radiographic evidence of pneumonia. Electronically Signed   By: Keith Rake M.D.   On: 10/29/2019 17:35   DG Chest Port 1 View  Result Date: 10/10/2019 CLINICAL DATA:  Shortness of breath. EXAM: PORTABLE CHEST 1 VIEW COMPARISON:  10/08/2019 FINDINGS: Stable enlargement of the heart. Coarse lung markings  are suggestive for chronic changes. Trachea is midline. Atherosclerotic calcifications at the aortic arch. Surgical clips  in the right upper chest and axillary region. No focal airspace disease or pulmonary edema. IMPRESSION: Chronic lung changes without acute findings. Electronically Signed   By: Markus Daft M.D.   On: 10/10/2019 09:54     CBC Recent Labs  Lab 10/29/19 1707 10/30/19 0630 10/31/19 0744 11/01/19 0735  WBC 9.8 10.3 12.8* 7.7  HGB 6.4* 7.2* 7.6* 7.5*  HCT 21.7* 24.2* 25.8* 25.7*  PLT 198 193 279 246  MCV 84.1 83.4 83.0 82.9  MCH 24.8* 24.8* 24.4* 24.2*  MCHC 29.5* 29.8* 29.5* 29.2*  RDW 19.3* 19.5* 19.7* 19.7*  LYMPHSABS 0.5*  --   --   --   MONOABS 0.6  --   --   --   EOSABS 0.1  --   --   --   BASOSABS 0.0  --   --   --     Chemistries  Recent Labs  Lab 10/29/19 1707 10/30/19 0630 10/31/19 0744 11/01/19 0735  NA 137 136 137 139  K 3.0* 3.8 3.6 3.5  CL 97* 96* 98 98  CO2 24 25 26 26   GLUCOSE 84 124* 99 93  BUN 27* 29* 38* 41*  CREATININE 2.80* 2.67* 2.90* 2.77*  CALCIUM 8.5* 8.5* 8.9 8.9  MG  --  1.9  --   --   AST 24  --   --   --   ALT 9  --   --   --   ALKPHOS 89  --   --   --   BILITOT 1.8*  --   --   --    ------------------------------------------------------------------------------------------------------------------ No results for input(s): CHOL, HDL, LDLCALC, TRIG, CHOLHDL, LDLDIRECT in the last 72 hours.  Lab Results  Component Value Date   HGBA1C 5.4 10/08/2019   ------------------------------------------------------------------------------------------------------------------ No results for input(s): TSH, T4TOTAL, T3FREE, THYROIDAB in the last 72 hours.  Invalid input(s): FREET3 ------------------------------------------------------------------------------------------------------------------ No results for input(s): VITAMINB12, FOLATE, FERRITIN, TIBC, IRON, RETICCTPCT in the last 72 hours.  Coagulation profile No results for  input(s): INR, PROTIME in the last 168 hours.  No results for input(s): DDIMER in the last 72 hours.  Cardiac Enzymes No results for input(s): CKMB, TROPONINI, MYOGLOBIN in the last 168 hours.  Invalid input(s): CK ------------------------------------------------------------------------------------------------------------------    Component Value Date/Time   BNP 1,507.0 (H) 10/29/2019 1707     Kathie Dike M.D on 11/01/2019 at 8:11 PM  Go to [email or web address] - for contact info  [facility] - Office  [phone]

## 2019-11-01 NOTE — Progress Notes (Signed)
Pharmacy Antibiotic Note  Bailey Bishop is a 79 y.o. female admitted on 10/29/2019 with sepsis - unknown source.  Pharmacy has been consulted for vancomycin and cefepime dosing.  8/5 BCx: NGTD 8/5 Covid 19: negative   Plan: Vancomycin 1000 mg IV every 48 hours.  Goal trough 15-20 mcg/mL.  Cefepime 2 g IV q24h Monitor clinical picture, renal function, vancomycin trough level if indicated F/U C&S, abx deescalation / LOT   Temp (24hrs), Avg:98.2 F (36.8 C), Min:98 F (36.7 C), Max:98.5 F (36.9 C)  Recent Labs  Lab 10/29/19 1707 10/29/19 1726 10/29/19 2014 10/30/19 0630 10/31/19 0744  WBC 9.8  --   --  10.3 12.8*  CREATININE 2.80*  --   --  2.67* 2.90*  LATICACIDVEN  --  3.7* 2.7*  --   --     Ht: 5'4" Wt: last reported 150 lbs Estimated creatinine clearance: 14 ml/min using IBW 54.7kg  Allergies  Allergen Reactions  . Cardizem Cd [Diltiazem Hcl Er Beads] Diarrhea  . Amiodarone Nausea And Vomiting  . Metoprolol Tartrate Diarrhea  . Sulfa Antibiotics Nausea And Vomiting  . Tomato Other (See Comments)    Burns stomach  . Carvedilol Diarrhea and Rash  . Chocolate Rash and Other (See Comments)    Dark chocolate      Thank you for allowing pharmacy to be a part of this patient's care.  Thomasenia Sales, PharmD, MBA, [place] Clinical Pharmacist

## 2019-11-01 NOTE — Progress Notes (Addendum)
Patient pulled IV out and is refusing to let nursing staff restart another IV. She has also pulled off telemetry and refuses to let staff replace.

## 2019-11-02 ENCOUNTER — Ambulatory Visit (HOSPITAL_BASED_OUTPATIENT_CLINIC_OR_DEPARTMENT_OTHER)
Admission: RE | Admit: 2019-11-02 | Discharge: 2019-11-02 | Disposition: A | Discharge status: Home / Self Care | Payer: Medicare Other | Source: Ambulatory Visit | Attending: Cardiology | Admitting: Cardiology

## 2019-11-02 ENCOUNTER — Telehealth: Payer: Self-pay | Admitting: Gastroenterology

## 2019-11-02 ENCOUNTER — Encounter (HOSPITAL_COMMUNITY): Payer: Medicare Other | Admitting: Cardiology

## 2019-11-02 ENCOUNTER — Encounter: Payer: Self-pay | Admitting: *Deleted

## 2019-11-02 ENCOUNTER — Encounter (HOSPITAL_COMMUNITY): Payer: Self-pay | Admitting: Cardiology

## 2019-11-02 VITALS — BP 102/58 | HR 90 | Ht 64.0 in | Wt 150.2 lb

## 2019-11-02 DIAGNOSIS — J90 Pleural effusion, not elsewhere classified: Secondary | ICD-10-CM | POA: Diagnosis not present

## 2019-11-02 DIAGNOSIS — I5023 Acute on chronic systolic (congestive) heart failure: Secondary | ICD-10-CM | POA: Insufficient documentation

## 2019-11-02 DIAGNOSIS — Z9013 Acquired absence of bilateral breasts and nipples: Secondary | ICD-10-CM | POA: Insufficient documentation

## 2019-11-02 DIAGNOSIS — I429 Cardiomyopathy, unspecified: Secondary | ICD-10-CM | POA: Diagnosis not present

## 2019-11-02 DIAGNOSIS — D649 Anemia, unspecified: Secondary | ICD-10-CM

## 2019-11-02 DIAGNOSIS — I4819 Other persistent atrial fibrillation: Secondary | ICD-10-CM | POA: Diagnosis not present

## 2019-11-02 DIAGNOSIS — R0602 Shortness of breath: Secondary | ICD-10-CM | POA: Diagnosis not present

## 2019-11-02 DIAGNOSIS — J45909 Unspecified asthma, uncomplicated: Secondary | ICD-10-CM | POA: Insufficient documentation

## 2019-11-02 DIAGNOSIS — Z87891 Personal history of nicotine dependence: Secondary | ICD-10-CM | POA: Insufficient documentation

## 2019-11-02 DIAGNOSIS — I5043 Acute on chronic combined systolic (congestive) and diastolic (congestive) heart failure: Secondary | ICD-10-CM | POA: Diagnosis not present

## 2019-11-02 DIAGNOSIS — Z79899 Other long term (current) drug therapy: Secondary | ICD-10-CM | POA: Insufficient documentation

## 2019-11-02 DIAGNOSIS — Z20822 Contact with and (suspected) exposure to covid-19: Secondary | ICD-10-CM | POA: Diagnosis not present

## 2019-11-02 DIAGNOSIS — R339 Retention of urine, unspecified: Secondary | ICD-10-CM | POA: Diagnosis not present

## 2019-11-02 DIAGNOSIS — I130 Hypertensive heart and chronic kidney disease with heart failure and stage 1 through stage 4 chronic kidney disease, or unspecified chronic kidney disease: Secondary | ICD-10-CM | POA: Insufficient documentation

## 2019-11-02 DIAGNOSIS — Z006 Encounter for examination for normal comparison and control in clinical research program: Secondary | ICD-10-CM

## 2019-11-02 DIAGNOSIS — R410 Disorientation, unspecified: Secondary | ICD-10-CM | POA: Diagnosis not present

## 2019-11-02 DIAGNOSIS — Z7901 Long term (current) use of anticoagulants: Secondary | ICD-10-CM | POA: Diagnosis not present

## 2019-11-02 DIAGNOSIS — I313 Pericardial effusion (noninflammatory): Secondary | ICD-10-CM | POA: Diagnosis not present

## 2019-11-02 DIAGNOSIS — I482 Chronic atrial fibrillation, unspecified: Secondary | ICD-10-CM | POA: Insufficient documentation

## 2019-11-02 DIAGNOSIS — K219 Gastro-esophageal reflux disease without esophagitis: Secondary | ICD-10-CM | POA: Insufficient documentation

## 2019-11-02 DIAGNOSIS — I50810 Right heart failure, unspecified: Secondary | ICD-10-CM | POA: Diagnosis not present

## 2019-11-02 DIAGNOSIS — E8770 Fluid overload, unspecified: Secondary | ICD-10-CM | POA: Diagnosis not present

## 2019-11-02 DIAGNOSIS — I10 Essential (primary) hypertension: Secondary | ICD-10-CM | POA: Diagnosis not present

## 2019-11-02 DIAGNOSIS — N184 Chronic kidney disease, stage 4 (severe): Secondary | ICD-10-CM | POA: Insufficient documentation

## 2019-11-02 DIAGNOSIS — Z9049 Acquired absence of other specified parts of digestive tract: Secondary | ICD-10-CM | POA: Diagnosis not present

## 2019-11-02 DIAGNOSIS — I5032 Chronic diastolic (congestive) heart failure: Secondary | ICD-10-CM | POA: Diagnosis not present

## 2019-11-02 DIAGNOSIS — I110 Hypertensive heart disease with heart failure: Secondary | ICD-10-CM | POA: Diagnosis not present

## 2019-11-02 DIAGNOSIS — I50813 Acute on chronic right heart failure: Secondary | ICD-10-CM | POA: Diagnosis not present

## 2019-11-02 DIAGNOSIS — I4821 Permanent atrial fibrillation: Secondary | ICD-10-CM | POA: Diagnosis not present

## 2019-11-02 DIAGNOSIS — D631 Anemia in chronic kidney disease: Secondary | ICD-10-CM | POA: Insufficient documentation

## 2019-11-02 DIAGNOSIS — Z9011 Acquired absence of right breast and nipple: Secondary | ICD-10-CM | POA: Diagnosis not present

## 2019-11-02 DIAGNOSIS — Z8719 Personal history of other diseases of the digestive system: Secondary | ICD-10-CM | POA: Insufficient documentation

## 2019-11-02 DIAGNOSIS — Z853 Personal history of malignant neoplasm of breast: Secondary | ICD-10-CM | POA: Diagnosis not present

## 2019-11-02 LAB — RENAL FUNCTION PANEL
Albumin: 3.5 g/dL (ref 3.5–5.0)
Anion gap: 12 (ref 5–15)
BUN: 33 mg/dL — ABNORMAL HIGH (ref 8–23)
CO2: 26 mmol/L (ref 22–32)
Calcium: 8.9 mg/dL (ref 8.9–10.3)
Chloride: 100 mmol/L (ref 98–111)
Creatinine, Ser: 2.35 mg/dL — ABNORMAL HIGH (ref 0.44–1.00)
GFR calc Af Amer: 22 mL/min — ABNORMAL LOW (ref >60)
GFR calc non Af Amer: 19 mL/min — ABNORMAL LOW (ref >60)
Glucose, Bld: 96 mg/dL (ref 70–99)
Phosphorus: 2.4 mg/dL — ABNORMAL LOW (ref 2.5–4.6)
Potassium: 3.9 mmol/L (ref 3.5–5.1)
Sodium: 138 mmol/L (ref 135–145)

## 2019-11-02 LAB — CBC
HCT: 24.9 % — ABNORMAL LOW (ref 36.0–46.0)
Hemoglobin: 7.5 g/dL — ABNORMAL LOW (ref 12.0–15.0)
MCH: 25.1 pg — ABNORMAL LOW (ref 26.0–34.0)
MCHC: 30.1 g/dL (ref 30.0–36.0)
MCV: 83.3 fL (ref 80.0–100.0)
Platelets: 248 10*3/uL (ref 150–400)
RBC: 2.99 MIL/uL — ABNORMAL LOW (ref 3.87–5.11)
RDW: 19.9 % — ABNORMAL HIGH (ref 11.5–15.5)
WBC: 6.2 10*3/uL (ref 4.0–10.5)
nRBC: 0 % (ref 0.0–0.2)

## 2019-11-02 LAB — BASIC METABOLIC PANEL
Anion gap: 13 (ref 5–15)
BUN: 28 mg/dL — ABNORMAL HIGH (ref 8–23)
CO2: 26 mmol/L (ref 22–32)
Calcium: 9.1 mg/dL (ref 8.9–10.3)
Chloride: 100 mmol/L (ref 98–111)
Creatinine, Ser: 2.38 mg/dL — ABNORMAL HIGH (ref 0.44–1.00)
GFR calc Af Amer: 21 mL/min — ABNORMAL LOW (ref >60)
GFR calc non Af Amer: 19 mL/min — ABNORMAL LOW (ref >60)
Glucose, Bld: 98 mg/dL (ref 70–99)
Potassium: 4.4 mmol/L (ref 3.5–5.1)
Sodium: 139 mmol/L (ref 135–145)

## 2019-11-02 MED ORDER — POTASSIUM CHLORIDE CRYS ER 20 MEQ PO TBCR
20.0000 meq | EXTENDED_RELEASE_TABLET | Freq: Two times a day (BID) | ORAL | 3 refills | Status: DC
Start: 2019-11-02 — End: 2020-04-28

## 2019-11-02 MED ORDER — LOPERAMIDE HCL 2 MG PO CAPS: 2.0000 mg | ORAL_CAPSULE | Freq: Three times a day (TID) | ORAL | 0 refills | Status: DC | PRN

## 2019-11-02 MED ORDER — TORSEMIDE 20 MG PO TABS: 40.0000 mg | ORAL_TABLET | Freq: Two times a day (BID) | ORAL | 3 refills | Status: DC

## 2019-11-02 MED ORDER — APIXABAN 2.5 MG PO TABS: 2.5000 mg | ORAL_TABLET | Freq: Two times a day (BID) | ORAL | 3 refills | Status: DC

## 2019-11-02 NOTE — Discharge Instructions (Signed)
1)Very low-salt diet advised 2)Weigh yourself daily, call if you gain more than 3 pounds in 1 day or more than 5 pounds in 1 week as your diuretic medications may need to be adjusted 3)Limit your Fluid  intake to no more than 50 ounces (1.5 Liters) per day 4)Avoid ibuprofen/Advil/Aleve/Motrin/Goody Powders/Naproxen/BC powders/Meloxicam/Diclofenac/Indomethacin and other Nonsteroidal anti-inflammatory medications as these will make you more likely to bleed and can cause stomach ulcers, can also cause Kidney problems.  5)Please Restart Eliquis/apixaban on Wednesday  11/04/19 6)Please keep your appointment with heart failure cardiologist Dr. Aundra Dubin on  Monday 11/02/2019 at 2:40 PM 7)You will need weekly CBC and BMP every Thursday starting 11/05/2019 8)Follow-up with Gastroenterologist for outpatient capsule endoscopy and for iron infusion from time to time

## 2019-11-02 NOTE — Progress Notes (Signed)
PCP: [name], MD HF Cardiology: Dr. Aundra Dubin  79 y.o. with history of CKD 4, chronic atrial fibrillation, anemia, and chronic systolic CHF: was referred by [name] for evaluation of CHF.  Patient has a complex past history.  She has a long history of atrial fibrillation, now chronic as she has failed DCCV and Tikosyn and did not tolerate amiodarone.  Her last echo in 6/21 showed EF 35-40% with mild RV dysfunction, large pericardial effusion without tamponade, moderate MR, moderate TR.  Cause of cardiomyopathy is not certain.    She has been admitted multiple times this year to [facility]. She feels like hospitalizations have not helped her shortness of breath.  Most recently, she was admitted to [facility] 10/29/19.  She was found to be anemic with hgb 6.4, FOBT+.  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  Hgb was 7.5 at time of discharge.  Eliquis was helped for several days, she was told to restart it on 8/11.  No further melena/BRPBR.  She has also been short of breath walking "5 feet."  She is orthopneic.  No chest pain.  She is rarely lightheaded with standing, no falls.  She was just discharged from the hospital today.   ECG (personally reviewed): atrial fibrillation, left axis deviation, low voltage.   Labs (8/21): K 3.9, creatinine 2.35, hgb 7.5, FOBT+  PMH: 1. CKD stage 4 2. Chronic atrial fibrillation: Failed DCCV in 11/18.  - Failed Tikosyn in 9/19.  - Intolerant of calcium channel blockers.  - Intolerant of amiodarone.  3. HTN 4. Asthma 5. GERD 6. Chronic systolic CHF: Uncertain etiology.  - Echo (6/21): EF 35-40%, global hypokinesis, mildly decreased RV systolic function with mild RV dilation, biatrial enlargement, large pericardial effusion without tamponade, moderate MR, moderate TR, IVC dilated.  7. Anemia of chronic renal disease.  8. Breast cancer: Bilateral mastectomy.  9. GI bleeding: Admission in 8/21. EGD/c-scope in 6/21 negative.   Social History    Socioeconomic History  . Marital status: Married    Spouse name: [name]  . Number of children: Not on file  . Years of education: Not on file  . Highest education level: Not on file  Occupational History  . Not on file  Tobacco Use  . Smoking status: Former Smoker    Packs/day: 1.00    Years: 28.00    Pack years: 28.00    Types: Cigarettes    Quit date: 07/17/1983    Years since quitting: 36.3  . Smokeless tobacco: Never Used  Vaping Use  . Vaping Use: Never used  Substance and Sexual Activity  . Alcohol use: No  . Drug use: No  . Sexual activity: Not on file  Other Topics Concern  . Not on file  Social History Narrative  . Not on file   Social Determinants of Health   Financial Resource Strain:   . Difficulty of Paying Living Expenses:   Food Insecurity:   . Worried About Charity fundraiser in the Last Year:   . Arboriculturist in the Last Year:   Transportation Needs: No Transportation Needs  . Lack of Transportation (Medical): No  . Lack of Transportation (Non-Medical): No  Physical Activity:   . Days of Exercise per Week:   . Minutes of Exercise per Session:   Stress:   . Feeling of Stress :   Social Connections: Unknown  . Frequency of Communication with Friends and Family: Not on file  .  Frequency of Social Gatherings with Friends and Family: Not on file  . Attends Religious Services: Not on file  . Active Member of Clubs or Organizations: Not on file  . Attends Archivist Meetings: Not on file  . Marital Status: Married  Human resources officer Violence:   . Fear of Current or Ex-Partner:   . Emotionally Abused:   Marland Kitchen Physically Abused:   . Sexually Abused:    Family History  Problem Relation Age of Onset  . Cancer Mother   . Aneurysm Father   . Cancer Sister   . Cancer Sister   . Anesthesia problems Neg Hx   . Hypotension Neg Hx   . Malignant hyperthermia Neg Hx   . Pseudochol deficiency Neg Hx   . Colon cancer Neg Hx    ROS: All  systems reviewed and negative except as per HPI.  Current Outpatient Medications  Medication Sig Dispense Refill  . acetaminophen (TYLENOL) 650 MG CR tablet Take 650 mg by mouth every 8 (eight) hours as needed for pain.    Marland Kitchen albuterol (VENTOLIN HFA) 108 (90 Base) MCG/ACT inhaler Inhale 1-2 puffs into the lungs every 6 (six) hours as needed for wheezing or shortness of breath.    . ALPRAZolam (XANAX) 0.5 MG tablet Take 0.5 mg by mouth at bedtime as needed for anxiety.     Derrill Memo ON 11/04/2019] apixaban (ELIQUIS) 2.5 MG TABS tablet Take 1 tablet (2.5 mg total) by mouth 2 (two) times daily. Restart on Wednesday  11/04/19 60 tablet 3  . bisoprolol (ZEBETA) 5 MG tablet Take 0.5 tablets (2.5 mg total) by mouth daily. 15 tablet 3  . calcium citrate-vitamin D (CITRACAL+D) 315-200 MG-UNIT tablet Take 1 tablet by mouth 2 (two) times daily.    . cetirizine HCl (ZYRTEC) 1 MG/ML solution Take 5 mLs (5 mg total) by mouth daily. 60 mL 0  . fluticasone (FLONASE) 50 MCG/ACT nasal spray Place 2 sprays into both nostrils daily. 16 g 0  . loperamide (IMODIUM) 2 MG capsule Take 1 capsule (2 mg total) by mouth every 8 (eight) hours as needed for diarrhea or loose stools. 15 capsule 0  . omeprazole (PRILOSEC) 40 MG capsule Take 40 mg by mouth as needed.    . potassium chloride SA (KLOR-CON) 20 MEQ tablet Take 1 tablet (20 mEq total) by mouth 2 (two) times daily. Start on 09/28/2019 when you restart Lasix 60 tablet 3  . torsemide (DEMADEX) 20 MG tablet Take 2 tablets (40 mg total) by mouth 2 (two) times daily. 120 tablet 3  . traZODone (DESYREL) 50 MG tablet Take 1 tablet (50 mg total) by mouth at bedtime as needed for sleep. 30 tablet 5   No current facility-administered medications for this encounter.   BP (!) 102/58   Pulse 90   Ht 5\' 4"  (1.626 m)   Wt 68.1 kg (150 lb 3.2 oz)   SpO2 97%   BMI 25.78 kg/m  General: NAD Neck: JVP 14+ cm, no thyromegaly or thyroid nodule.  Lungs: Clear to auscultation bilaterally  with normal respiratory effort. CV: Nondisplaced PMI.  Heart irregular S1/S2, no S3/S4, no murmur.  2+ edema to knees.  No carotid bruit.  Unable to palpate pedal pulses due to edema.  Abdomen: Soft, nontender, no hepatosplenomegaly, no distention.  Skin: Intact without lesions or rashes.  Neurologic: Alert and oriented x 3.  Psych: Normal affect. Extremities: No clubbing or cyanosis.  HEENT: Normal.   Assessment/Plan: 1. Acute on chronic systolic  CHF: Echo in 6/21 with EF 35-40% with mild RV dysfunction, large pericardial effusion without tamponade, moderate MR, moderate TR.  Cause of cardiomyopathy is not certain. No chest pain.  She is quite volume overloaded on exam with NYHA class IIIb symptoms.  It seems that she was prematurely released from [facility] this morning. She has been on torsemide 20 mg bid.  - I will have her increase torsemide to 60 qam/40 qpm x 2 days then 40 mg bid. BMET in 1 week.  - Increase KCl to 20 bid.  - Wear graded compression stockings.   - Continue bisoprolol 2.5 mg daily.  - No digoxin, Entresto, spironolactone for now with CKD stage IV.  - In absence of ACS, would not plan for LHC/RHC given CKD stage IV.  2. Atrial fibrillation: Chronic, has failed DCCV and Tikosyn.  She has not tolerated amiodarone.  Rate control good on bisoprolol 2.5 mg daily.  - Continue bisoprolol 2.5 daily.  - Restart apixaban 2.5 mg bid on 8/11 if no further melena/BRBPR.  3. Anemia: Multifactorial, suspect due to renal disease as well as GI bleeding.  Hgb down to 6.5 recently in setting of GI bleeding with FOBT+.  Hgb up to 7.5 today.    - To followup with GI for capsule endoscopy.  - No blood products ([employer]).  - Will refer to hematology at [facility] for anemia evaluation, Aranesp/feraheme.  4. CKD: Stage IV.  Watch creatinine closely with diuresis.  - She has a nephrologist.  5. Pericardial effusion: Large on 6/21 echo without tamponade.  - Repeat limited echo for  pericardial effusion at followup in 2 wks.   [name] 11/03/2019

## 2019-11-02 NOTE — Progress Notes (Signed)
Nsg Discharge Note  Admit Date:  10/29/2019 Discharge date: 11/02/2019   Bailey Bishop to be D/C'd Home per MD order.  AVS completed.  Copy for chart, and copy for patient signed, and dated. Patient's caregiver able to verbalize understanding.  Discharge Medication: Allergies as of 11/02/2019      Reactions   Cardizem Cd [diltiazem Hcl Er Beads] Diarrhea   Amiodarone Nausea And Vomiting   Metoprolol Tartrate Diarrhea   Sulfa Antibiotics Nausea And Vomiting   Tomato Other (See Comments)   Burns stomach   Carvedilol Diarrhea, Rash   Chocolate Rash, Other (See Comments)   Dark chocolate      Medication List    STOP taking these medications   furosemide 20 MG tablet Commonly known as: LASIX     TAKE these medications   acetaminophen 650 MG CR tablet Commonly known as: TYLENOL Take 650 mg by mouth every 8 (eight) hours as needed for pain.   albuterol 108 (90 Base) MCG/ACT inhaler Commonly known as: VENTOLIN HFA Inhale 1-2 puffs into the lungs every 6 (six) hours as needed for wheezing or shortness of breath.   ALPRAZolam 0.5 MG tablet Commonly known as: XANAX Take 0.5 mg by mouth at bedtime as needed for anxiety.   apixaban 2.5 MG Tabs tablet Commonly known as: ELIQUIS Take 1 tablet (2.5 mg total) by mouth 2 (two) times daily. Restart on Wednesday  11/04/19 Start taking on: November 04, 2019 What changed:   additional instructions  These instructions start on November 04, 2019. If you are unsure what to do until then, ask your doctor or other care provider.   bisoprolol 5 MG tablet Commonly known as: ZEBETA Take 0.5 tablets (2.5 mg total) by mouth daily.   calcium citrate-vitamin D 315-200 MG-UNIT tablet Commonly known as: CITRACAL+D Take 1 tablet by mouth 2 (two) times daily.   cetirizine HCl 1 MG/ML solution Commonly known as: ZYRTEC Take 5 mLs (5 mg total) by mouth daily.   fluticasone 50 MCG/ACT nasal spray Commonly known as: FLONASE Place 2 sprays into both  nostrils daily.   loperamide 2 MG capsule Commonly known as: IMODIUM Take 1 capsule (2 mg total) by mouth every 8 (eight) hours as needed for diarrhea or loose stools.   omeprazole 40 MG capsule Commonly known as: PRILOSEC Take 40 mg by mouth as needed.   potassium chloride SA 20 MEQ tablet Commonly known as: KLOR-CON Take 1 tablet (20 mEq total) by mouth daily. Start on 09/28/2019 when you restart Lasix   torsemide 20 MG tablet Commonly known as: Demadex Take 1 tablet (20 mg total) by mouth 2 (two) times daily.   traZODone 50 MG tablet Commonly known as: DESYREL Take 1 tablet (50 mg total) by mouth at bedtime as needed for sleep.       Discharge Assessment: Vitals:   11/01/19 1955 11/01/19 2103  BP:  (!) 111/46  Pulse:  85  Resp:  16  Temp:  98.1 F (36.7 C)  SpO2: 98% 99%   Skin clean, dry and intact without evidence of skin break down, no evidence of skin tears noted. IV catheter discontinued intact. Site without signs and symptoms of complications - no redness or edema noted at insertion site, patient denies c/o pain - only slight tenderness at site.  Dressing with slight pressure applied.  D/c Instructions-Education: Discharge instructions given to patient's husband Bailey Bishop with verbalized understanding. D/c education completed with patient/family including follow up instructions, medication list, d/c activities  limitations if indicated, with other d/c instructions as indicated by MD - patient able to verbalize understanding, all questions fully answered. Patient instructed to return to ED, call 911, or call MD for any changes in condition.  Patient escorted via [place], and D/C home via private auto.  Bailey Gaal Loletha Grayer, RN 11/02/2019 11:17 AM

## 2019-11-02 NOTE — Discharge Summary (Signed)
Bailey Bishop, is a 79 y.o. female  DOB [birth year]  MRN [id number].  Admission date:  10/29/2019  Admitting Physician  Albertine Patricia, MD  Discharge Date:  11/02/2019   Primary MD  [name], MD  Recommendations for primary care physician for things to follow:   1)Very low-salt diet advised 2)Weigh yourself daily, call if you gain more than 3 pounds in 1 day or more than 5 pounds in 1 week as your diuretic medications may need to be adjusted 3)Limit your Fluid  intake to no more than 60 ounces (1.8 Liters) per day 4)Avoid ibuprofen/Advil/Aleve/Motrin/Goody Powders/Naproxen/BC powders/Meloxicam/Diclofenac/Indomethacin and other Nonsteroidal anti-inflammatory medications as these will make you more likely to bleed and can cause stomach ulcers, can also cause Kidney problems.  5)Please Restart Eliquis/apixaban on Wednesday  11/04/19 6)Please keep your appointment with heart failure cardiologist Dr. Aundra Dubin on  Monday 11/02/2019 at 2:40 PM 7)You will need weekly CBC and BMP every Thursday starting 11/05/2019 8)Follow-up with Gastroenterologist for outpatient capsule endoscopy and for iron infusion from time to time  Admission Diagnosis  Acute blood loss anemia [D62] SOB (shortness of breath) [R06.02] Fever [R50.9] Sepsis, due to unspecified organism, unspecified whether acute organ dysfunction present [name]) [A41.9]   Discharge Diagnosis  Acute blood loss anemia [D62] SOB (shortness of breath) [R06.02] Fever [R50.9] Sepsis, due to unspecified organism, unspecified whether acute organ dysfunction present ([place]) [A41.9]    Active Problems:   Essential hypertension   COPD (chronic obstructive pulmonary disease) ([place])   Anticoagulated   Atrial fibrillation ([place])   Diverticulosis of large intestine without diverticulitis   CKD (chronic kidney disease), stage IIIb   GI bleed   Fever   Acute blood loss  anemia      Past Medical History:  Diagnosis Date  . Anxiety   . Arthritis   . Asthma   . Atrial fibrillation ([place])    a. s/p unsuccessful DCCV in 01/2017 and intolerant to Amiodarone --> Rate-control strategy pursued since b. failed Tikosyn in 11/2017  . Breast cancer ([place]) 2001   Right  . Essential hypertension   . GERD (gastroesophageal reflux disease)   . T10 vertebral fracture [facility])     Past Surgical History:  Procedure Laterality Date  . BIOPSY N/A 04/22/2018   Procedure: BIOPSY;  Surgeon: Aviva Signs, MD;  [facility];  Service: Gastroenterology;  Laterality: N/A;  . BIOPSY  09/06/2019   Procedure: BIOPSY;  Surgeon: Daneil Dolin, MD;  [facility];  Service: Endoscopy;;  gastric  . CARDIOVERSION N/A 02/18/2017   Procedure: CARDIOVERSION;  Surgeon: Josue Hector, MD;  Location: [facility]COPY;  Service: Cardiovascular;  Laterality: N/A;  . CARDIOVERSION N/A 12/04/2017   Procedure: CARDIOVERSION;  Surgeon: [name], MD;  Location: [place];  Service: Cardiovascular;  Laterality: N/A;  . CATARACT EXTRACTION W/PHACO  07/23/2011   Procedure: CATARACT EXTRACTION PHACO AND INTRAOCULAR LENS PLACEMENT ([place]);  Surgeon: Williams Che, MD;  Location: [facility];  Service: Ophthalmology;  Laterality: Right;  CDE:9.96  . CATARACT EXTRACTION W/PHACO Left 11/13/2018   Procedure: CATARACT EXTRACTION PHACO AND INTRAOCULAR LENS PLACEMENT (IOC);  Surgeon: Baruch Goldmann, MD;  Location: [facility];  Service: Ophthalmology;  Laterality: Left;  left, CDE: 7.44  . CHOLECYSTECTOMY N/A 02/14/2015   Procedure: LAPAROSCOPIC CHOLECYSTECTOMY;  Surgeon: Aviva Signs, MD;  Location[facility];  Service: General;  Laterality: N/A;  . COLONOSCOPY N/A 04/22/2018   Procedure: COLONOSCOPY;  Surgeon: Aviva Signs, MD;  [facility];  Service: Gastroenterology;  Laterality: N/A;  . COLONOSCOPY WITH PROPOFOL N/A 09/06/2019   Procedure: COLONOSCOPY WITH PROPOFOL;  Surgeon:  Daneil Dolin, MD;  [facility];  Service: Endoscopy;  Laterality: N/A;  . ESOPHAGEAL DILATION  09/06/2019   Procedure: ESOPHAGEAL DILATION;  Surgeon: Daneil Dolin, MD;  [facility];  Service: Endoscopy;;  . ESOPHAGOGASTRODUODENOSCOPY N/A 04/22/2018   Procedure: ESOPHAGOGASTRODUODENOSCOPY (EGD);  Surgeon: Aviva Signs, MD;  [facility];  Service: Gastroenterology;  Laterality: N/A;  . ESOPHAGOGASTRODUODENOSCOPY (EGD) WITH PROPOFOL N/A 09/06/2019   Procedure: ESOPHAGOGASTRODUODENOSCOPY (EGD) WITH PROPOFOL;  Surgeon: Daneil Dolin, MD;  [facility];  Service: Endoscopy;  Laterality: N/A;  . MASTECTOMY     bilateral mastectomy-right breast cancer-left taken by choice  . POLYPECTOMY  04/22/2018   Procedure: POLYPECTOMY;  Surgeon: Aviva Signs, MD;  [facility];  Service: Gastroenterology;;  . POLYPECTOMY  09/06/2019   Procedure: POLYPECTOMY;  Surgeon: Daneil Dolin, MD;  [facility];  Service: Endoscopy;;  cecal, ascending, hepatic flexure, sigmoid  . SEPTOPLASTY  1995       HPI  from the history and physical done on the day of admission:     Bailey Bishop  is a 79 y.o. female, with medical history significant ofA. Fib (on eliquis), diastolic HF, GERD, anxiety, HTN, CKD stage 3b, hx of right breast cancer (status post bilateral mastectomy), asthma (mild and intermittent), recent admission around 09/03/19--09/06/19 due to SOB and findings of anemia at that time (no transfusion needed and no active source of bleeding appreciated); patient was subsequently seen on 09/10/19 due to similar complaints at the ED and treated for bronchitis and discharge home, and another from 6/25-7/1 due to acute on chronic diastolic CHF.  - patient with recent endoscopy/colonoscopy and 09/06/2019, significant for diverticulosis/polyps, and erosive gastropathy and sketches ring. -Patient presents to ED secondary to complaints of shortness of  breath as well she had recent ED visits for the same, most recently about 2 weeks ago where her diuretics has been adjusted and she was discharged home, she has been reporting for last few days some sinus congestion, runny nose and some cough, she denies fever, went to UC on 7/29 where she was given nasal spray, she did decline Covid test then, given she was vaccinated, EMS patient was hypoxic where she was given Solu-Medrol in route, in ED patient only requiring 2 L nasal cannula, she was noted to be febrile of 103, chest x-ray significant for some congestion, her Covid test has been negative, she has negative urine analysis, her hemoglobin was noted to be low at 6.4, reports she is compliant with Eliquis, she denies any NSAIDs use, denies any coffee-ground emesis, patient did refuse blood transfusion, and she was asked why reports she is Jehovah's Witness and she does not want any blood products transfusion, patient was started on broad-spectrum antibiotics, and [facility] hospitalist consulted to admit.     Hospital Course:   Brief Summary:- 79 y.o.femalewith  medical history significant ofA. Fib (on eliquis),systolicHFwith EF 81-19%, GERD, anxiety, HTN, CKD stage 3b, hx of right breast cancer status post bilateral mastectomy, asthma admitted on 10/29/2019 with fatigue, dizziness, fevers, generalized weakness and Hemoccult positive stool with hemoglobin down to 6.4   A/p 1Acute on chronic symptomatic Anemia/acute GI bleed---suspect chronic anemia of CKD worsened by acute blood loss/GI bleed --patient is a Jehovah witness and declines transfusion Hgb 6.4 >>7.5 (w/o transfusion)---patient did receive Aranesp -patient underwent a recent EGD and colonoscopy on 09/06/2019 - EGD showed Schatzki's ring s/p dilation, gastric erosions - biopsies showed chronic gastritis and GIM neg for H. pylori. Colonoscopy showed- Eight 4 to 8 mm polyps in the sigmoid colon, in the descending colon, at the hepatic flexure  and in the cecum,one 13 mm polyp in the descending colon(some TA). No evidence of any lesions to explain anemia were present during endoluminal evaluation on 09/06/2019 -GI consult appreciated, attempted capsule endoscopy on 8/7 unsuccessful since patient regurgitated capsule -Eliquis on hold -Given chronic anemia in a patient with CKD-May benefit from Procrit/ESA injections every couple of weeks Continue PPI. -Received iron infusion on 8/7  2)-HFrEF--acute on chronic combined systolicand diastolicheart failure -Echo from June 2021 with EF of 35 to 40%  and severe global hypokinesis -Continue bisoprolol to 2.5 mg daily  -unable to use ACE/ARB given worsening renal function and soft BP. -Restart Lasix at 20 mg daily upon discharge home -Patient has appointment with Dr. Aundra Dubin at the heart failure clinic on 11/02/2019 at 240 pm--- she is hoping to be discharged prior to that so she can make an appointment  3-Permanent atrial fibrillation--- has failed DCCV and Tikosyn. She has not tolerated amiodarone -Rate controlled and stable --Previously was using verapamil;switched over tobisoprolol 2.5 mg dailydue to low EF of 35 to 40 -discussed with Dr. Laural Golden  -May restart Eliquis on 11/04/2019 if no further bleeding  4-Fevers--had fevers above 103 on admission UA and chest x-ray without definite infection, blood cultures NGTD, Flagyl has been discontinued.  Since blood cultures are negative at 48 hours and no further fevers, will discontinue further antibiotics   5-history of asthma -No wheezing currently -Continue as needed bronchodilator nebulizer tx.  6--AKI----acute kidney injury on CKD stage -IIIb--worsening renal function appears to be due to diuresis/anemia -creatinine on admission=2.80, baseline creatinine = 1.4 to 1.6, creatinine is now= 2.9, creatinine improved to 2.3 --renally adjust medications, avoid nephrotoxic agents / dehydration / hypotension  7-history of  breast cancer -Status post bilateral mastectomy -Continue surveillance as an outpatient  8-history of anxiety -stable mood -Continue as needed Xanax.  9-social/ethics.  Advance care planning discussion with patient and husband -Agree to DNR status  10-acute encephalopathy.  Unclear etiology.  No signs of UTI.  Appears to be calm at this time.  Possible hospital psychosis versus sundowning.  QTC noted to be prolonged, so would avoid antipsychotics --Overall as per husband patient's mentation appears to be back to baseline at this time   Code Status:DNR Family Communication:Husband at bedside Disposition:Home--outpatient follow-up with heart failure cardiologist  Consults  :  Gi consult  Discharge Condition: stable  Follow UP   Follow-up Information    [name], MD. Schedule an appointment as soon as possible for a visit in 3 day(s).   Specialty: Internal Medicine Contact information: [address] [place] [facility] [address] [phone]        Herminio Commons, MD .   Specialty: Cardiology Contact information: [place]  [place] [address] [phone]        Evans Lance, MD .   Specialty: Cardiology Contact information: [place] [address] [phone]               Diet and Activity recommendation:  As advised  Discharge Instructions     Discharge Instructions    AMB referral to CHF clinic   Complete by: As directed    Call MD for:  difficulty breathing, headache or visual disturbances   Complete by: As directed    Call MD for:  persistant dizziness or light-headedness   Complete by: As directed    Call MD for:  persistant nausea and vomiting   Complete by: As directed    Call MD for:  severe uncontrolled pain   Complete by: As directed    Call MD for:  temperature >100.4   Complete by: As directed    Diet - low sodium heart healthy   Complete by: As directed    Discharge instructions   Complete  by: As directed    1)Very low-salt diet advised 2)Weigh yourself daily, call if you gain more than 3 pounds in 1 day or more than 5 pounds in 1 week as your diuretic medications may need to be adjusted 3)Limit your Fluid  intake to no more than 60 ounces (1.8 Liters) per day 4)Avoid ibuprofen/Advil/Aleve/Motrin/Goody Powders/Naproxen/BC powders/Meloxicam/Diclofenac/Indomethacin and other Nonsteroidal anti-inflammatory medications as these will make you more likely to bleed and can cause stomach ulcers, can also cause Kidney problems.  5)Please Restart Eliquis/apixaban on Wednesday  11/04/19 6)Please keep your appointment with heart failure cardiologist Dr. Aundra Dubin on  Monday 11/02/2019 at 2:40 PM 7)You will need weekly CBC and BMP every Thursday starting 11/05/2019 8)Follow-up with Gastroenterologist for outpatient capsule endoscopy and for iron infusion from time to time   Increase activity slowly   Complete by: As directed         Discharge Medications     Allergies as of 11/02/2019      Reactions   Cardizem Cd [diltiazem Hcl Er Beads] Diarrhea   Amiodarone Nausea And Vomiting   Metoprolol Tartrate Diarrhea   Sulfa Antibiotics Nausea And Vomiting   Tomato Other (See Comments)   Burns stomach   Carvedilol Diarrhea, Rash   Chocolate Rash, Other (See Comments)   Dark chocolate      Medication List    STOP taking these medications   furosemide 20 MG tablet Commonly known as: LASIX     TAKE these medications   acetaminophen 650 MG CR tablet Commonly known as: TYLENOL Take 650 mg by mouth every 8 (eight) hours as needed for pain.   albuterol 108 (90 Base) MCG/ACT inhaler Commonly known as: VENTOLIN HFA Inhale 1-2 puffs into the lungs every 6 (six) hours as needed for wheezing or shortness of breath.   ALPRAZolam 0.5 MG tablet Commonly known as: XANAX Take 0.5 mg by mouth at bedtime as needed for anxiety.   apixaban 2.5 MG Tabs tablet Commonly known as: ELIQUIS Take 1 tablet  (2.5 mg total) by mouth 2 (two) times daily. Restart on Wednesday  11/04/19 Start taking on: November 04, 2019 What changed:   additional instructions  These instructions start on November 04, 2019. If you are unsure what to do until then, ask your doctor or other care provider.   bisoprolol 5 MG tablet Commonly known as: ZEBETA Take 0.5 tablets (2.5 mg total) by mouth daily.   calcium citrate-vitamin D  315-200 MG-UNIT tablet Commonly known as: CITRACAL+D Take 1 tablet by mouth 2 (two) times daily.   cetirizine HCl 1 MG/ML solution Commonly known as: ZYRTEC Take 5 mLs (5 mg total) by mouth daily.   fluticasone 50 MCG/ACT nasal spray Commonly known as: FLONASE Place 2 sprays into both nostrils daily.   loperamide 2 MG capsule Commonly known as: IMODIUM Take 1 capsule (2 mg total) by mouth every 8 (eight) hours as needed for diarrhea or loose stools.   omeprazole 40 MG capsule Commonly known as: PRILOSEC Take 40 mg by mouth as needed.   potassium chloride SA 20 MEQ tablet Commonly known as: KLOR-CON Take 1 tablet (20 mEq total) by mouth daily. Start on 09/28/2019 when you restart Lasix   torsemide 20 MG tablet Commonly known as: Demadex Take 1 tablet (20 mg total) by mouth 2 (two) times daily.   traZODone 50 MG tablet Commonly known as: DESYREL Take 1 tablet (50 mg total) by mouth at bedtime as needed for sleep.       Major procedures and Radiology Reports - PLEASE review detailed and final reports for all details, in brief -    DG Chest 2 View  Result Date: 10/09/2019 CLINICAL DATA:  Shortness of breath, peripheral edema, chronic kidney disease EXAM: CHEST - 2 VIEW COMPARISON:  09/23/2019 FINDINGS: Stable cardiomegaly with mild vascular congestion. No significant developing edema pattern. Similar chronic perihilar and lower lobe bandlike opacities compatible with atelectasis and or scarring. No significant interval change in aeration. No definite superimposed pneumonia,  significant collapse or consolidation. Small pleural effusions remain. Aorta atherosclerotic. Trachea is midline. Chronic appearing compression fractures of the lower thoracic spine. IMPRESSION: Stable chronic chest findings with cardiomegaly, vascular congestion, and similar pattern of bilateral atelectasis/scarring. Persistent small pleural effusions No new process by plain radiography Aortic Atherosclerosis (ICD10-I70.0). Electronically Signed   By: Jerilynn Mages.  Shick M.D.   On: 10/09/2019 15:12   US RENAL  Result Date: 10/09/2019 CLINICAL DATA:  Chronic kidney disease stage 4, hypertension, previous smoker EXAM: RENAL / URINARY TRACT ULTRASOUND COMPLETE COMPARISON:  04/13/2019 FINDINGS: Right Kidney: Renal measurements: 8.4 x 4.2 x 3.6 cm = volume: 66 mL . Echogenicity within normal limits. No mass or hydronephrosis visualized. Left Kidney: Renal measurements: 9.0 x 5.1 x 3.4 cm = volume: 80 mL. Left kidney lower pole anechoic cyst measures 3.6 x 2.9 x 3.4 cm. This correlates with the comparison CT. Bladder: Appears normal for degree of bladder distention. Other: None. IMPRESSION: No acute renal abnormality by ultrasound. Negative for hydronephrosis. 3.6 cm left kidney lower pole cyst. Electronically Signed   By: Jerilynn Mages.  Shick M.D.   On: 10/09/2019 15:15   DG Chest Port 1 View  Result Date: 10/29/2019 CLINICAL DATA:  Shortness of breath and fever today. EXAM: PORTABLE CHEST 1 VIEW COMPARISON:  Most recent radiograph 10/10/2019. Most recent CT 09/05/2019. FINDINGS: Stable cardiomegaly. Interstitial coarsening appears similar to prior exam. Areas of scarring in both lungs. Suspected small right pleural effusion. Linear atelectasis in the left mid lung versus fluid in the left inter lobar fissure. No focal airspace disease to suggest pneumonia. No pneumothorax. Bones are under mineralized. IMPRESSION: 1. Stable cardiomegaly. 2. Chronic interstitial coarsening. Probable small right pleural effusion. 3. No radiographic  evidence of pneumonia. Electronically Signed   By: Keith Rake M.D.   On: 10/29/2019 17:35   DG Chest Port 1 View  Result Date: 10/10/2019 CLINICAL DATA:  Shortness of breath. EXAM: PORTABLE CHEST 1 VIEW COMPARISON:  10/08/2019  FINDINGS: Stable enlargement of the heart. Coarse lung markings are suggestive for chronic changes. Trachea is midline. Atherosclerotic calcifications at the aortic arch. Surgical clips in the right upper chest and axillary region. No focal airspace disease or pulmonary edema. IMPRESSION: Chronic lung changes without acute findings. Electronically Signed   By: Markus Daft M.D.   On: 10/10/2019 09:54    Micro Results   Recent Results (from the past 240 hour(s))  SARS Coronavirus 2 by RT PCR (hospital order, performed in [facility] hospital lab) Nasopharyngeal Nasopharyngeal Swab     Status: None   Collection Time: 10/29/19  5:26 PM   Specimen: Nasopharyngeal Swab  Result Value Ref Range Status   SARS Coronavirus 2 NEGATIVE NEGATIVE Final    Comment: (NOTE) SARS-CoV-2 target nucleic acids are NOT DETECTED.  The SARS-CoV-2 RNA is generally detectable in upper and lower respiratory specimens during the acute phase of infection. The lowest concentration of SARS-CoV-2 viral copies this assay can detect is 250 copies / mL. A negative result does not preclude SARS-CoV-2 infection and should not be used as the sole basis for treatment or other patient management decisions.  A negative result may occur with improper specimen collection / handling, submission of specimen other than nasopharyngeal swab, presence of viral mutation(s) within the areas targeted by this assay, and inadequate number of viral copies (<250 copies / mL). A negative result must be combined with clinical observations, patient history, and epidemiological information.  Fact Sheet for Patients:   StrictlyIdeas.no  Fact Sheet for Healthcare  Providers: BankingDealers.co.za  This test is not yet approved or  cleared by the [place] FDA and has been authorized for detection and/or diagnosis of SARS-CoV-2 by FDA under an Emergency Use Authorization (EUA).  This EUA will remain in effect (meaning this test can be used) for the duration of the COVID-19 declaration under Section 564(b)(1) of the Act, 21 U.S.C. section 360bbb-3(b)(1), unless the authorization is terminated or revoked sooner.  Performed at [facility], [address], [place], [place] [address]   Blood Culture (routine x 2)     Status: None (Preliminary result)   Collection Time: 10/29/19  5:26 PM   Specimen: BLOOD LEFT FOREARM  Result Value Ref Range Status   Specimen Description BLOOD LEFT FOREARM  Final   Special Requests   Final    BOTTLES DRAWN AEROBIC AND ANAEROBIC Blood Culture adequate volume   Culture   Final    NO GROWTH 2 DAYS Performed at [facility], [address], [place], [place] [address]    Report Status PENDING  Incomplete  Blood Culture (routine x 2)     Status: None (Preliminary result)   Collection Time: 10/29/19  5:56 PM   Specimen: Left Antecubital; Blood  Result Value Ref Range Status   Specimen Description LEFT ANTECUBITAL  Final   Special Requests   Final    BOTTLES DRAWN AEROBIC AND ANAEROBIC Blood Culture adequate volume   Culture   Final    NO GROWTH 2 DAYS Performed at [facility], [address], [place], [place] [address]    Report Status PENDING  Incomplete       Today   Subjective    Bailey Bishop today has no new complaints No fever  Or chills   No Nausea, Vomiting or Diarrhea         Patient has been seen and examined prior to discharge   Objective   Blood pressure (!) 111/46, pulse 85, temperature 98.1  F (36.7 C), temperature source Oral, resp. rate 16, height 5\' 4"  (1.626 m), weight 66.9 kg, SpO2 99 %.   Intake/Output Summary (Last 24 hours) at 11/02/2019 1100 Last  data filed at 11/01/2019 1500 Gross per 24 hour  Intake 267 ml  Output --  Net 267 ml    Exam Gen:- Awake Alert, no acute distress  HEENT:- [place].AT, No sclera icterus Neck-Supple Neck, ,.  Lungs-  CTAB , good air movement bilaterally  CV- S1, S2 normal, irregularly irregular Abd-  +ve B.Sounds, Abd Soft, No tenderness,    Extremity/Skin:- trace  edema,   good pulses Psych-affect is appropriate, oriented x3 Neuro-no new focal deficits, no tremors    Data Review   CBC w Diff:  Lab Results  Component Value Date   WBC 6.2 11/02/2019   HGB 7.5 (L) 11/02/2019   HCT 24.9 (L) 11/02/2019   PLT 248 11/02/2019   LYMPHOPCT 5 10/29/2019   MONOPCT 6 10/29/2019   EOSPCT 1 10/29/2019   BASOPCT 0 10/29/2019    CMP:  Lab Results  Component Value Date   NA 138 11/02/2019   NA 142 10/24/2016   K 3.9 11/02/2019   CL 100 11/02/2019   CO2 26 11/02/2019   BUN 33 (H) 11/02/2019   BUN 16 10/24/2016   CREATININE 2.35 (H) 11/02/2019   CREATININE 2.15 (H) 09/30/2019   PROT 7.3 10/29/2019   ALBUMIN 3.5 11/02/2019   BILITOT 1.8 (H) 10/29/2019   ALKPHOS 89 10/29/2019   AST 24 10/29/2019   ALT 9 10/29/2019  .   Total Discharge time is about 33 minutes  Roxan Hockey M.D on 11/02/2019 at 11:00 AM  Go to [email or web address] -  for contact info  [facility] - Office  [phone]

## 2019-11-02 NOTE — Telephone Encounter (Signed)
Noted. Lmom, will see where pt wants to have labs completed Thursday.

## 2019-11-02 NOTE — Research (Addendum)
Subject Name: Bailey Bishop  Subject met inclusion and exclusion criteria.  The informed consent form, study requirements and expectations were reviewed with the subject and questions and concerns were addressed prior to the signing of the consent form.  The subject verbalized understanding of the trial requirements.  The subject agreed to participate in the AT HOME trial and signed the informed consent at 1537 on 11/02/19  The informed consent was obtained prior to performance of any protocol-specific procedures for the subject.  A copy of the signed informed consent was given to the subject and a copy was placed in the subject's medical record.   Bailey Bishop          [facility] Cardiopulmonary Examination & Physical Examination Summary    Cardiopulmonary Exam  Was the CP Exam Performed? '[x]'  Yes (Complete Below) '[]'  No (Complete Protocol Deviation)      Heart Sounds  S3: '[x]'  Absent '[]'  Present   S4: '[x]'  Absent '[]'  Present  Murmur  Murmur: '[]'  Yes '[x]'  No   If Yes:  '[]'  Systolic '[]'  Diastolic   If yes select grade: '[]'  I/VI '[]'  II/VI '[]'  III/VI '[]'  IV/VI '[]'  V/VI '[]'  VI/VI   Chest Sounds  Rales: '[]'  Yes '[x]'  No   If yes check one: '[]'  ? 1/3 of lung fields full '[]'  1/2 of lung fields full '[]'  > 1/2 of lung fields full  If yes check one: '[]'  Left '[]'  Right '[]'  Bilateral    Edema   Edema (pedal): '[]'  Absent '[]'  Trace '[]'  1+ (slight) '[x]'  2+ (moderate) '[]'  3+ (severe)    Hepatomegaly  Hepatomegaly: '[x]'  None '[]'  < 2 cm '[]'  2-4 cm '[]'  > 4 cm  Peripheral Pulses  Peripheral Pulses: '[x]'  Normal '[]'  Abnormal   If Abnormal, explain:             Physical Exam   Was the physical Exam Performed?  '[x]'  Yes        '[]'  No (complete protocol deviation)    . Lungs/Chest?     '[x]'  Normal        '[]'  Abnormal        '[]'  Not Done  If abnormal, was it clinically significant?     '[]'  Yes   '[]'  No       . Dermatologic (Abdominal Area):   '[x]'  Normal        '[]'   Abnormal        '[]'  Not Done   If abnormal, was it clinically significant?  '[]'  Yes   '[]'  No       . Periphery:     '[x]'  Normal          '[]'  Abnormal         '[]'  Not Done   If abnormal, was it clinically significant?  '[]'  Yes    '[]'  No      . Other Body System Examined?  '[]'  Yes        '[x]'  No          New York Heart Association (NYHA) Heart Failure Classification Indicate the subject's current NYHA Classification at the present time (check only one) Was the NYHA Performed:    '[x]'  Yes    '[]' No (Complete Protocol Deviation)     '[]'   Class I:  Patients have cardiac disease but without the resulting limitations of physical activity. Ordinary physical activity does not cause undue fatigue, palpitation, dyspnea or anginal pain.     '[]'   Class  II:    Patients have cardiac disease resulting in slight limitation of physical activity. They are comfortable at rest. Ordinary physical activity results in fatigue, palpitation, dyspnea or anginal pain.     '[x]'   Class III:  Patients have cardiac disease resulting in marked limitation of physical activity. They are comfortable at rest. Less than ordinary physical activity causes fatigue, palpitation, dyspnea or anginal pain.     '[]'   Class IV: Patients have cardiac disease resulting in inability to carry on any physical activity without discomfort. Symptoms of cardiac insufficiency or of the anginal syndrome may be present even at rest. If any physical activity is undertaken, discomfort is increased.

## 2019-11-02 NOTE — Patient Instructions (Addendum)
Increase Torsemide to to 60 mg (3 tabs) every AM and 40 mg (2 tabs) in PM FOR 2 DAYS, then take 40 mg (2 tabs) Twice daily   Increase Potassium to 20 meq Twice daily   Please wear your compression hose daily, place them on as soon as you get up in the morning and remove before you go to bed at night.  You have been referred to Hematology at [facility], they will call you for an appointment  Your physician recommends that you schedule a follow-up appointment in:    Tomorrow 8/10 at 11:00 AM, parking code [place]    Thursday 8/12 at 11:20 AM, parking code [place]    Monday 8/16 at 2:15 PM, for echocardiogram         And 3:30 with [place], PA, parking code [place]    Wednesday 9/8 at 10:30 AM, parking code [place]  If you have any questions or concerns before your next appointment please send [place] a message through [employer] or call our office at [phone].    TO LEAVE A MESSAGE FOR THE NURSE SELECT OPTION 2, PLEASE LEAVE A MESSAGE INCLUDING: . YOUR NAME . DATE OF BIRTH . CALL BACK NUMBER . REASON FOR CALL**this is important as we prioritize the call backs  [place] AS LONG AS YOU CALL BEFORE 4:00 PM  At the [facility], you and your health needs are our priority. As part of our continuing mission to provide you with exceptional heart care, we have created designated Provider Care Teams. These Care Teams include your primary Cardiologist (physician) and Advanced Practice Providers (APPs- Physician Assistants and Nurse Practitioners) who all work together to provide you with the care you need, when you need it.   You may see any of the following providers on your designated Care Team at your next follow up: Marland Kitchen Dr Glori Bickers . Dr [name] . Darrick Grinder, NP . Lyda Jester, PA . Audry Riles, PharmD   Please be sure to bring in all your medications bottles to every appointment.

## 2019-11-02 NOTE — Telephone Encounter (Signed)
Bailey Bishop, please arrange hospital follow-up with [place] in 3-4 weeks.   [name]: needs CBC on Thursday.

## 2019-11-03 ENCOUNTER — Telehealth (HOSPITAL_COMMUNITY): Payer: Self-pay | Admitting: Cardiology

## 2019-11-03 ENCOUNTER — Telehealth: Payer: Self-pay | Admitting: Family Medicine

## 2019-11-03 ENCOUNTER — Encounter (HOSPITAL_COMMUNITY): Payer: Medicare Other

## 2019-11-03 ENCOUNTER — Other Ambulatory Visit: Payer: Self-pay | Admitting: *Deleted

## 2019-11-03 ENCOUNTER — Encounter (HOSPITAL_COMMUNITY): Payer: Self-pay | Admitting: Pharmacy Technician

## 2019-11-03 ENCOUNTER — Telehealth: Payer: Self-pay | Admitting: *Deleted

## 2019-11-03 ENCOUNTER — Inpatient Hospital Stay (HOSPITAL_COMMUNITY)
Admission: EM | Admit: 2019-11-03 | Discharge: 2019-11-11 | DRG: 286 | Disposition: A | Discharge status: Home Health | Payer: Medicare Other | Attending: Cardiology | Admitting: Cardiology

## 2019-11-03 ENCOUNTER — Other Ambulatory Visit: Payer: Self-pay

## 2019-11-03 ENCOUNTER — Emergency Department (HOSPITAL_COMMUNITY): Payer: Medicare Other

## 2019-11-03 DIAGNOSIS — Z87891 Personal history of nicotine dependence: Secondary | ICD-10-CM

## 2019-11-03 DIAGNOSIS — Z853 Personal history of malignant neoplasm of breast: Secondary | ICD-10-CM

## 2019-11-03 DIAGNOSIS — I509 Heart failure, unspecified: Principal | ICD-10-CM

## 2019-11-03 DIAGNOSIS — I313 Pericardial effusion (noninflammatory): Secondary | ICD-10-CM | POA: Diagnosis present

## 2019-11-03 DIAGNOSIS — R410 Disorientation, unspecified: Secondary | ICD-10-CM | POA: Diagnosis present

## 2019-11-03 DIAGNOSIS — I5043 Acute on chronic combined systolic (congestive) and diastolic (congestive) heart failure: Secondary | ICD-10-CM | POA: Diagnosis present

## 2019-11-03 DIAGNOSIS — R339 Retention of urine, unspecified: Secondary | ICD-10-CM | POA: Diagnosis present

## 2019-11-03 DIAGNOSIS — Z79899 Other long term (current) drug therapy: Secondary | ICD-10-CM

## 2019-11-03 DIAGNOSIS — I130 Hypertensive heart and chronic kidney disease with heart failure and stage 1 through stage 4 chronic kidney disease, or unspecified chronic kidney disease: Principal | ICD-10-CM | POA: Diagnosis present

## 2019-11-03 DIAGNOSIS — D631 Anemia in chronic kidney disease: Secondary | ICD-10-CM | POA: Diagnosis present

## 2019-11-03 DIAGNOSIS — Z9049 Acquired absence of other specified parts of digestive tract: Secondary | ICD-10-CM

## 2019-11-03 DIAGNOSIS — Z20822 Contact with and (suspected) exposure to covid-19: Secondary | ICD-10-CM | POA: Diagnosis present

## 2019-11-03 DIAGNOSIS — N184 Chronic kidney disease, stage 4 (severe): Secondary | ICD-10-CM | POA: Diagnosis present

## 2019-11-03 DIAGNOSIS — E8770 Fluid overload, unspecified: Secondary | ICD-10-CM | POA: Diagnosis present

## 2019-11-03 DIAGNOSIS — K219 Gastro-esophageal reflux disease without esophagitis: Secondary | ICD-10-CM | POA: Diagnosis present

## 2019-11-03 DIAGNOSIS — I5023 Acute on chronic systolic (congestive) heart failure: Secondary | ICD-10-CM | POA: Diagnosis present

## 2019-11-03 DIAGNOSIS — J45909 Unspecified asthma, uncomplicated: Secondary | ICD-10-CM | POA: Diagnosis present

## 2019-11-03 DIAGNOSIS — Z9011 Acquired absence of right breast and nipple: Secondary | ICD-10-CM

## 2019-11-03 DIAGNOSIS — Z7901 Long term (current) use of anticoagulants: Secondary | ICD-10-CM

## 2019-11-03 DIAGNOSIS — I482 Chronic atrial fibrillation, unspecified: Secondary | ICD-10-CM | POA: Diagnosis present

## 2019-11-03 DIAGNOSIS — I429 Cardiomyopathy, unspecified: Secondary | ICD-10-CM | POA: Diagnosis present

## 2019-11-03 DIAGNOSIS — I3139 Other pericardial effusion (noninflammatory): Secondary | ICD-10-CM

## 2019-11-03 DIAGNOSIS — F40240 Claustrophobia: Secondary | ICD-10-CM | POA: Diagnosis present

## 2019-11-03 HISTORY — DX: Heart failure, unspecified: I50.9

## 2019-11-03 LAB — CBC
HCT: 24.8 % — ABNORMAL LOW (ref 36.0–46.0)
Hemoglobin: 7.4 g/dL — ABNORMAL LOW (ref 12.0–15.0)
MCH: 25 pg — ABNORMAL LOW (ref 26.0–34.0)
MCHC: 29.8 g/dL — ABNORMAL LOW (ref 30.0–36.0)
MCV: 83.8 fL (ref 80.0–100.0)
Platelets: 258 10*3/uL (ref 150–400)
RBC: 2.96 MIL/uL — ABNORMAL LOW (ref 3.87–5.11)
RDW: 19.9 % — ABNORMAL HIGH (ref 11.5–15.5)
WBC: 7.3 10*3/uL (ref 4.0–10.5)
nRBC: 0.8 % — ABNORMAL HIGH (ref 0.0–0.2)

## 2019-11-03 LAB — CULTURE, BLOOD (ROUTINE X 2)
Culture: NO GROWTH
Culture: NO GROWTH
Special Requests: ADEQUATE
Special Requests: ADEQUATE

## 2019-11-03 LAB — TROPONIN I (HIGH SENSITIVITY)
Troponin I (High Sensitivity): 18 ng/L — ABNORMAL HIGH (ref <18)
Troponin I (High Sensitivity): 17 ng/L (ref <18)

## 2019-11-03 LAB — BASIC METABOLIC PANEL
Anion gap: 10 (ref 5–15)
BUN: 28 mg/dL — ABNORMAL HIGH (ref 8–23)
CO2: 27 mmol/L (ref 22–32)
Calcium: 9.2 mg/dL (ref 8.9–10.3)
Chloride: 102 mmol/L (ref 98–111)
Creatinine, Ser: 2.46 mg/dL — ABNORMAL HIGH (ref 0.44–1.00)
GFR calc Af Amer: 21 mL/min — ABNORMAL LOW (ref >60)
GFR calc non Af Amer: 18 mL/min — ABNORMAL LOW (ref >60)
Glucose, Bld: 99 mg/dL (ref 70–99)
Potassium: 4.2 mmol/L (ref 3.5–5.1)
Sodium: 139 mmol/L (ref 135–145)

## 2019-11-03 NOTE — Telephone Encounter (Signed)
Just got off the phone with patient and her husband. Mrs. Wonders was very upset with the fact that she didn't receive the medical device in the research study and did not like the changes that were made to her medications. She felt like she needed to be admitted to the hospital. I made patient aware that I was very sorry that she didn't receive the device but ultimately it is up to the charting system to decide if she randomizes for device or medical management. I talked with her and her husband in length about how great our heart failure clinic is and encouraged her to come back in today to be seen again. Patient refused and said she will need to find somewhere closer. Patient would no longer like to do the research study either.

## 2019-11-03 NOTE — Patient Outreach (Signed)
[place] [name]) Care Management  11/03/2019  [place] 07-20-1940 [id number]   Referral received from inpatient LCSW and hospital liaison due to increased readmission risk.  Member has 3 admissions in the last 6 months, discharged yesterday.  Noted she is currently back in the ED.  Hospital liaison notified, will follow up with member once she is discharged back home.  Valente David, [place], MSN [place] [phone]

## 2019-11-03 NOTE — Telephone Encounter (Signed)
Mr. Droz called stating that patient continues to retain fluid. States that she has been to [facility] for 5 times but no better.States that she is very short of breath and has so much fluid. Wants to know if patient can be sent to [name] to see if they can draw off fluid.  Please call [phone].please see notes from visit with Dr. Aundra Dubin on 11-02-2019.

## 2019-11-03 NOTE — Telephone Encounter (Signed)
Patient husband stated they want to cancel all appts made and participation in research, pt feel the adjusts DM made in meds will not do any good, they will go another route, and will return all equipment the next time they take a trip to [place]. Please advise

## 2019-11-03 NOTE — ED Triage Notes (Signed)
Pt here with husband with reports of increased shob worsening over the last day. Pt spouse reports pt with hx afib. Also states her hgb was 7 however pt refuses blood products. Pt reports increased fatigue.

## 2019-11-03 NOTE — ED Notes (Signed)
[name], husband, [phone] would like an update when available

## 2019-11-03 NOTE — ED Notes (Signed)
Husband call want pt to call back  [phone]

## 2019-11-03 NOTE — Telephone Encounter (Signed)
Reports SOB is worse today ands she feels worse today than yesterday Reports having dizziness associated with SOB Reports mouth being very sore especially top of mouth preventing normal speech and eating Denies chest pain Advised to go to the ED now for an evaluation Verbalized understanding of plan

## 2019-11-03 NOTE — ED Notes (Signed)
[name] (husband) left because he can't drive in the dark. He would like to be notified with any updates.  [phone]

## 2019-11-03 NOTE — Telephone Encounter (Signed)
Dr Aundra Dubin is aware, see phone note from [place].

## 2019-11-04 ENCOUNTER — Emergency Department (HOSPITAL_COMMUNITY): Payer: Medicare Other

## 2019-11-04 ENCOUNTER — Encounter: Payer: Self-pay | Admitting: Gastroenterology

## 2019-11-04 DIAGNOSIS — Z853 Personal history of malignant neoplasm of breast: Secondary | ICD-10-CM | POA: Diagnosis not present

## 2019-11-04 DIAGNOSIS — I50813 Acute on chronic right heart failure: Secondary | ICD-10-CM | POA: Diagnosis not present

## 2019-11-04 DIAGNOSIS — I5023 Acute on chronic systolic (congestive) heart failure: Secondary | ICD-10-CM

## 2019-11-04 DIAGNOSIS — J90 Pleural effusion, not elsewhere classified: Secondary | ICD-10-CM | POA: Diagnosis not present

## 2019-11-04 DIAGNOSIS — Z20822 Contact with and (suspected) exposure to covid-19: Secondary | ICD-10-CM | POA: Diagnosis not present

## 2019-11-04 DIAGNOSIS — E8770 Fluid overload, unspecified: Secondary | ICD-10-CM | POA: Diagnosis present

## 2019-11-04 DIAGNOSIS — I313 Pericardial effusion (noninflammatory): Secondary | ICD-10-CM

## 2019-11-04 DIAGNOSIS — Z7901 Long term (current) use of anticoagulants: Secondary | ICD-10-CM | POA: Diagnosis not present

## 2019-11-04 DIAGNOSIS — R410 Disorientation, unspecified: Secondary | ICD-10-CM | POA: Diagnosis present

## 2019-11-04 DIAGNOSIS — F40240 Claustrophobia: Secondary | ICD-10-CM | POA: Diagnosis present

## 2019-11-04 DIAGNOSIS — N184 Chronic kidney disease, stage 4 (severe): Secondary | ICD-10-CM | POA: Diagnosis not present

## 2019-11-04 DIAGNOSIS — I50810 Right heart failure, unspecified: Secondary | ICD-10-CM | POA: Diagnosis not present

## 2019-11-04 DIAGNOSIS — D631 Anemia in chronic kidney disease: Secondary | ICD-10-CM | POA: Diagnosis present

## 2019-11-04 DIAGNOSIS — I130 Hypertensive heart and chronic kidney disease with heart failure and stage 1 through stage 4 chronic kidney disease, or unspecified chronic kidney disease: Secondary | ICD-10-CM | POA: Diagnosis not present

## 2019-11-04 DIAGNOSIS — I517 Cardiomegaly: Secondary | ICD-10-CM | POA: Diagnosis not present

## 2019-11-04 DIAGNOSIS — I5043 Acute on chronic combined systolic (congestive) and diastolic (congestive) heart failure: Secondary | ICD-10-CM | POA: Diagnosis not present

## 2019-11-04 DIAGNOSIS — Z9049 Acquired absence of other specified parts of digestive tract: Secondary | ICD-10-CM | POA: Diagnosis not present

## 2019-11-04 DIAGNOSIS — K219 Gastro-esophageal reflux disease without esophagitis: Secondary | ICD-10-CM | POA: Diagnosis present

## 2019-11-04 DIAGNOSIS — D649 Anemia, unspecified: Secondary | ICD-10-CM | POA: Diagnosis not present

## 2019-11-04 DIAGNOSIS — I482 Chronic atrial fibrillation, unspecified: Secondary | ICD-10-CM | POA: Diagnosis not present

## 2019-11-04 DIAGNOSIS — I509 Heart failure, unspecified: Secondary | ICD-10-CM | POA: Diagnosis not present

## 2019-11-04 DIAGNOSIS — I429 Cardiomyopathy, unspecified: Secondary | ICD-10-CM | POA: Diagnosis present

## 2019-11-04 DIAGNOSIS — R0602 Shortness of breath: Secondary | ICD-10-CM | POA: Diagnosis not present

## 2019-11-04 DIAGNOSIS — Z9011 Acquired absence of right breast and nipple: Secondary | ICD-10-CM | POA: Diagnosis not present

## 2019-11-04 DIAGNOSIS — Z87891 Personal history of nicotine dependence: Secondary | ICD-10-CM | POA: Diagnosis not present

## 2019-11-04 DIAGNOSIS — Z79899 Other long term (current) drug therapy: Secondary | ICD-10-CM | POA: Diagnosis not present

## 2019-11-04 DIAGNOSIS — I4821 Permanent atrial fibrillation: Secondary | ICD-10-CM | POA: Diagnosis not present

## 2019-11-04 DIAGNOSIS — J45909 Unspecified asthma, uncomplicated: Secondary | ICD-10-CM | POA: Diagnosis present

## 2019-11-04 DIAGNOSIS — R339 Retention of urine, unspecified: Secondary | ICD-10-CM | POA: Diagnosis present

## 2019-11-04 LAB — BASIC METABOLIC PANEL
Anion gap: 13 (ref 5–15)
BUN: 26 mg/dL — ABNORMAL HIGH (ref 8–23)
CO2: 28 mmol/L (ref 22–32)
Calcium: 9.2 mg/dL (ref 8.9–10.3)
Chloride: 100 mmol/L (ref 98–111)
Creatinine, Ser: 2.43 mg/dL — ABNORMAL HIGH (ref 0.44–1.00)
GFR calc Af Amer: 21 mL/min — ABNORMAL LOW (ref >60)
GFR calc non Af Amer: 18 mL/min — ABNORMAL LOW (ref >60)
Glucose, Bld: 90 mg/dL (ref 70–99)
Potassium: 4.2 mmol/L (ref 3.5–5.1)
Sodium: 141 mmol/L (ref 135–145)

## 2019-11-04 LAB — CBC
HCT: 26.5 % — ABNORMAL LOW (ref 36.0–46.0)
Hemoglobin: 7.7 g/dL — ABNORMAL LOW (ref 12.0–15.0)
MCH: 24.6 pg — ABNORMAL LOW (ref 26.0–34.0)
MCHC: 29.1 g/dL — ABNORMAL LOW (ref 30.0–36.0)
MCV: 84.7 fL (ref 80.0–100.0)
Platelets: 254 10*3/uL (ref 150–400)
RBC: 3.13 MIL/uL — ABNORMAL LOW (ref 3.87–5.11)
RDW: 21.3 % — ABNORMAL HIGH (ref 11.5–15.5)
WBC: 7.4 10*3/uL (ref 4.0–10.5)
nRBC: 0.3 % — ABNORMAL HIGH (ref 0.0–0.2)

## 2019-11-04 LAB — ECHOCARDIOGRAM COMPLETE
Area-P 1/2: 6.17 cm2
S' Lateral: 1.89 cm

## 2019-11-04 LAB — IRON AND TIBC
Iron: 74 ug/dL (ref 28–170)
Saturation Ratios: 20 % (ref 10.4–31.8)
TIBC: 374 ug/dL (ref 250–450)
UIBC: 300 ug/dL

## 2019-11-04 LAB — SARS CORONAVIRUS 2 BY RT PCR (HOSPITAL ORDER, PERFORMED IN ~~LOC~~ HOSPITAL LAB): SARS Coronavirus 2: NEGATIVE

## 2019-11-04 LAB — TSH: TSH: 1.887 u[IU]/mL (ref 0.350–4.500)

## 2019-11-04 LAB — FERRITIN: Ferritin: 311 ng/mL — ABNORMAL HIGH (ref 11–307)

## 2019-11-04 LAB — BRAIN NATRIURETIC PEPTIDE: B Natriuretic Peptide: 1511.3 pg/mL — ABNORMAL HIGH (ref 0.0–100.0)

## 2019-11-04 MED ORDER — ONDANSETRON HCL 4 MG/2ML IJ SOLN: 4.0000 mg | Freq: Four times a day (QID) | INTRAMUSCULAR | Status: DC | PRN

## 2019-11-04 MED ORDER — POTASSIUM CHLORIDE CRYS ER 20 MEQ PO TBCR
20.0000 meq | EXTENDED_RELEASE_TABLET | Freq: Two times a day (BID) | ORAL | Status: DC
  Administered 2019-11-04 – 2019-11-11 (×15): 20 meq via ORAL
  Filled 2019-11-04 (×15): qty 1

## 2019-11-04 MED ORDER — ACETAMINOPHEN 325 MG PO TABS: 650.0000 mg | ORAL_TABLET | Freq: Four times a day (QID) | ORAL | Status: DC | PRN

## 2019-11-04 MED ORDER — SODIUM CHLORIDE 0.9% FLUSH: 3.0000 mL | INTRAVENOUS | Status: DC | PRN

## 2019-11-04 MED ORDER — FUROSEMIDE 10 MG/ML IJ SOLN
80.0000 mg | Freq: Once | INTRAMUSCULAR | Status: AC
  Administered 2019-11-04: 80 mg via INTRAVENOUS
  Filled 2019-11-04: qty 8

## 2019-11-04 MED ORDER — SODIUM CHLORIDE 0.9 % IV SOLN: 250.0000 mL | INTRAVENOUS | Status: DC | PRN

## 2019-11-04 MED ORDER — FUROSEMIDE 10 MG/ML IJ SOLN
80.0000 mg | Freq: Two times a day (BID) | INTRAMUSCULAR | Status: DC
  Administered 2019-11-04 – 2019-11-11 (×13): 80 mg via INTRAVENOUS
  Filled 2019-11-04 (×14): qty 8

## 2019-11-04 MED ORDER — SODIUM CHLORIDE 0.9% FLUSH
3.0000 mL | Freq: Two times a day (BID) | INTRAVENOUS | Status: DC
  Administered 2019-11-04 – 2019-11-11 (×12): 3 mL via INTRAVENOUS

## 2019-11-04 MED ORDER — BISOPROLOL FUMARATE 5 MG PO TABS
2.5000 mg | ORAL_TABLET | Freq: Every day | ORAL | Status: DC
  Administered 2019-11-04 – 2019-11-11 (×8): 2.5 mg via ORAL
  Filled 2019-11-04: qty 0.5
  Filled 2019-11-04 (×8): qty 1

## 2019-11-04 NOTE — ED Provider Notes (Addendum)
[facility] Emergency Department Provider Note MRN:  [id number]  Arrival date & time: 11/27/19     Chief Complaint   Shortness of Breath   History of Present Illness   Bailey Bishop is a 79 y.o. year-old female with a history of CHF, A. fib presenting to the ED with chief complaint of shortness of breath.  Worsening shortness of breath since returning home from the hospital yesterday. Associated with pressure-like chest pain. Denies fever or cough. Worsening lower extremity edema now up to the abdomen. No abdominal pain. Unable to walk since January. Lasix dose was increased upon discharge.  Review of Systems  A complete 10 system review of systems was obtained and all systems are negative except as noted in the HPI and PMH.   Patient's Health History    Past Medical History:  Diagnosis Date  . Anxiety   . Arthritis   . Asthma   . Atrial fibrillation ([place])    a. s/p unsuccessful DCCV in 01/2017 and intolerant to Amiodarone --> Rate-control strategy pursued since b. failed Tikosyn in 11/2017  . Breast cancer ([place]) 2001   Right  . CHF (congestive heart failure) ([place])   . Essential hypertension   . GERD (gastroesophageal reflux disease)   . T10 vertebral fracture [facility])     Past Surgical History:  Procedure Laterality Date  . BIOPSY N/A 04/22/2018   Procedure: BIOPSY;  Surgeon: Aviva Signs, MD;  [facility];  Service: Gastroenterology;  Laterality: N/A;  . BIOPSY  09/06/2019   Procedure: BIOPSY;  Surgeon: Daneil Dolin, MD;  [facility];  Service: Endoscopy;;  gastric  . CARDIOVERSION N/A 02/18/2017   Procedure: CARDIOVERSION;  Surgeon: Josue Hector, MD;  Location: [facility] ENDOSCOPY;  Service: Cardiovascular;  Laterality: N/A;  . CARDIOVERSION N/A 12/04/2017   Procedure: CARDIOVERSION;  Surgeon: [name], MD;  Location: [place];  Service: Cardiovascular;  Laterality: N/A;  . CATARACT EXTRACTION W/PHACO   07/23/2011   Procedure: CATARACT EXTRACTION PHACO AND INTRAOCULAR LENS PLACEMENT ([place]);  Surgeon: Williams Che, MD;  Location: [facility];  Service: Ophthalmology;  Laterality: Right;  CDE:9.96  . CATARACT EXTRACTION W/PHACO Left 11/13/2018   Procedure: CATARACT EXTRACTION PHACO AND INTRAOCULAR LENS PLACEMENT (IOC);  Surgeon: Baruch Goldmann, MD;  Location: [facility];  Service: Ophthalmology;  Laterality: Left;  left, CDE: 7.44  . CHOLECYSTECTOMY N/A 02/14/2015   Procedure: LAPAROSCOPIC CHOLECYSTECTOMY;  Surgeon: Aviva Signs, MD;  Location: [facility];  Service: General;  Laterality: N/A;  . COLONOSCOPY N/A 04/22/2018   Procedure: COLONOSCOPY;  Surgeon: Aviva Signs, MD;  [facility];  Service: Gastroenterology;  Laterality: N/A;  . COLONOSCOPY WITH PROPOFOL N/A 09/06/2019   Procedure: COLONOSCOPY WITH PROPOFOL;  Surgeon: Daneil Dolin, MD;  [facility];  Service: Endoscopy;  Laterality: N/A;  . ESOPHAGEAL DILATION  09/06/2019   Procedure: ESOPHAGEAL DILATION;  Surgeon: Daneil Dolin, MD;  [facility];  Service: Endoscopy;;  . ESOPHAGOGASTRODUODENOSCOPY N/A 04/22/2018   Procedure: ESOPHAGOGASTRODUODENOSCOPY (EGD);  Surgeon: Aviva Signs, MD;  [facility];  Service: Gastroenterology;  Laterality: N/A;  . ESOPHAGOGASTRODUODENOSCOPY (EGD) WITH PROPOFOL N/A 09/06/2019   Procedure: ESOPHAGOGASTRODUODENOSCOPY (EGD) WITH PROPOFOL;  Surgeon: Daneil Dolin, MD;  [facility];  Service: Endoscopy;  Laterality: N/A;  . GIVENS CAPSULE STUDY N/A 10/31/2019   Procedure: GIVENS CAPSULE STUDY;  Surgeon: Harvel Quale, MD;  [facility];  Service: Gastroenterology;  Laterality: N/A;  .  MASTECTOMY     bilateral mastectomy-right breast cancer-left taken by choice  . POLYPECTOMY  04/22/2018   Procedure: POLYPECTOMY;  Surgeon: Aviva Signs, MD;  [facility];  Service: Gastroenterology;;  . POLYPECTOMY  09/06/2019   Procedure:  POLYPECTOMY;  Surgeon: Daneil Dolin, MD;  [facility];  Service: Endoscopy;;  cecal, ascending, hepatic flexure, sigmoid  . RIGHT HEART CATH N/A 11/09/2019   Procedure: RIGHT HEART CATH;  Surgeon: Jolaine Artist, MD;  Location: [facility];  Service: Cardiovascular;  Laterality: N/A;  . SEPTOPLASTY  1995    Family History  Problem Relation Age of Onset  . Cancer Mother   . Aneurysm Father   . Cancer Sister   . Cancer Sister   . Anesthesia problems Neg Hx   . Hypotension Neg Hx   . Malignant hyperthermia Neg Hx   . Pseudochol deficiency Neg Hx   . Colon cancer Neg Hx     Social History   Socioeconomic History  . Marital status: Married    Spouse name: [name]  . Number of children: 1  . Years of education: Not on file  . Highest education level: Not on file  Occupational History  . Occupation: retired  Tobacco Use  . Smoking status: Former Smoker    Packs/day: 1.00    Years: 28.00    Pack years: 28.00    Types: Cigarettes    Quit date: 07/17/1983    Years since quitting: 36.3  . Smokeless tobacco: Never Used  Vaping Use  . Vaping Use: Never used  Substance and Sexual Activity  . Alcohol use: No  . Drug use: No  . Sexual activity: Not on file  Other Topics Concern  . Not on file  Social History Narrative  . Not on file   Social Determinants of Health   Financial Resource Strain:   . Difficulty of Paying Living Expenses: Not on file  Food Insecurity: No Food Insecurity  . Worried About Charity fundraiser in the Last Year: Never true  . Ran Out of Food in the Last Year: Never true  Transportation Needs: No Transportation Needs  . Lack of Transportation (Medical): No  . Lack of Transportation (Non-Medical): No  Physical Activity:   . Days of Exercise per Week: Not on file  . Minutes of Exercise per Session: Not on file  Stress:   . Feeling of Stress : Not on file  Social Connections: Moderately Integrated  . Frequency of Communication  with Friends and Family: Once a week  . Frequency of Social Gatherings with Friends and Family: Once a week  . Attends Religious Services: 1 to 4 times per year  . Active Member of Clubs or Organizations: No  . Attends Archivist Meetings: 1 to 4 times per year  . Marital Status: Married  Human resources officer Violence:   . Fear of Current or Ex-Partner: Not on file  . Emotionally Abused: Not on file  . Physically Abused: Not on file  . Sexually Abused: Not on file     Physical Exam   Vitals:   11/11/19 0757 11/11/19 0846  BP: 103/63 (!) 98/56  Pulse: 79 79  Resp: 18   Temp: 98.9 F (37.2 C)   SpO2: 97% 96%    CONSTITUTIONAL: Chronically ill-appearing, NAD NEURO:  Alert and oriented x 3, no focal deficits EYES:  eyes equal and reactive ENT/NECK:  no LAD, no JVD CARDIO: Regular rate, well-perfused, normal  S1 and S2 PULM:  CTAB no wheezing or rhonchi GI/GU:  normal bowel sounds, non-distended, non-tender MSK/SPINE:  No gross deformities pitting edema to the lower extremities up to the lower abdomen SKIN:  no rash, atraumatic PSYCH:  Appropriate speech and behavior  *Additional and/or pertinent findings included in MDM below  Diagnostic and Interventional Summary    EKG Interpretation  Date/Time:  Tuesday November 03 2019 12:13:15 EDT Ventricular Rate:  82 PR Interval:    QRS Duration: 84 QT Interval:  314 QTC Calculation: 366 R Axis:   -13 Text Interpretation: Undetermined rhythm Low voltage QRS Possible Anterolateral infarct , age undetermined Abnormal ECG When compared with ECG of 11/02/2019, Premature ventricular complexes are no longer present Confirmed by Delora Fuel ([id number]) on 11/03/2019 11:14:08 PM      Labs Reviewed  BASIC METABOLIC PANEL - Abnormal; Notable for the following components:      Result Value   BUN 28 (*)    Creatinine, Ser 2.46 (*)    GFR calc non Af Amer 18 (*)    GFR calc Af Amer 21 (*)    All other components within normal limits    CBC - Abnormal; Notable for the following components:   RBC 2.96 (*)    Hemoglobin 7.4 (*)    HCT 24.8 (*)    MCH 25.0 (*)    MCHC 29.8 (*)    RDW 19.9 (*)    nRBC 0.8 (*)    All other components within normal limits  BRAIN NATRIURETIC PEPTIDE - Abnormal; Notable for the following components:   B Natriuretic Peptide 1,511.3 (*)    All other components within normal limits  BASIC METABOLIC PANEL - Abnormal; Notable for the following components:   BUN 26 (*)    Creatinine, Ser 2.43 (*)    GFR calc non Af Amer 18 (*)    GFR calc Af Amer 21 (*)    All other components within normal limits  CBC - Abnormal; Notable for the following components:   RBC 3.13 (*)    Hemoglobin 7.7 (*)    HCT 26.5 (*)    MCH 24.6 (*)    MCHC 29.1 (*)    RDW 21.3 (*)    nRBC 0.3 (*)    All other components within normal limits  FERRITIN - Abnormal; Notable for the following components:   Ferritin 311 (*)    All other components within normal limits  BASIC METABOLIC PANEL - Abnormal; Notable for the following components:   BUN 24 (*)    Creatinine, Ser 2.55 (*)    GFR calc non Af Amer 17 (*)    GFR calc Af Amer 20 (*)    All other components within normal limits  CBC - Abnormal; Notable for the following components:   RBC 3.08 (*)    Hemoglobin 7.5 (*)    HCT 25.8 (*)    MCH 24.4 (*)    MCHC 29.1 (*)    RDW 21.8 (*)    All other components within normal limits  BASIC METABOLIC PANEL - Abnormal; Notable for the following components:   Creatinine, Ser 2.40 (*)    GFR calc non Af Amer 18 (*)    GFR calc Af Amer 21 (*)    All other components within normal limits  CBC - Abnormal; Notable for the following components:   RBC 3.08 (*)    Hemoglobin 7.8 (*)    HCT 26.3 (*)    MCH 25.3 (*)  MCHC 29.7 (*)    RDW 23.0 (*)    All other components within normal limits  BASIC METABOLIC PANEL - Abnormal; Notable for the following components:   Creatinine, Ser 2.35 (*)    GFR calc non Af Amer 19 (*)     GFR calc Af Amer 22 (*)    All other components within normal limits  CBC - Abnormal; Notable for the following components:   RBC 3.05 (*)    Hemoglobin 7.6 (*)    HCT 26.1 (*)    MCH 24.9 (*)    MCHC 29.1 (*)    RDW 23.9 (*)    All other components within normal limits  BASIC METABOLIC PANEL - Abnormal; Notable for the following components:   Creatinine, Ser 2.21 (*)    GFR calc non Af Amer 20 (*)    GFR calc Af Amer 23 (*)    All other components within normal limits  CBC - Abnormal; Notable for the following components:   RBC 3.36 (*)    Hemoglobin 8.6 (*)    HCT 28.7 (*)    MCH 25.6 (*)    RDW 24.6 (*)    All other components within normal limits  BASIC METABOLIC PANEL - Abnormal; Notable for the following components:   Chloride 97 (*)    Creatinine, Ser 2.23 (*)    Calcium 8.8 (*)    GFR calc non Af Amer 20 (*)    GFR calc Af Amer 23 (*)    All other components within normal limits  CBC - Abnormal; Notable for the following components:   RBC 3.20 (*)    Hemoglobin 8.3 (*)    HCT 27.5 (*)    MCH 25.9 (*)    RDW 25.1 (*)    All other components within normal limits  BASIC METABOLIC PANEL - Abnormal; Notable for the following components:   Glucose, Bld 117 (*)    Creatinine, Ser 2.15 (*)    Calcium 8.8 (*)    GFR calc non Af Amer 21 (*)    GFR calc Af Amer 24 (*)    All other components within normal limits  CBC - Abnormal; Notable for the following components:   RBC 3.38 (*)    Hemoglobin 8.5 (*)    HCT 29.4 (*)    MCH 25.1 (*)    MCHC 28.9 (*)    RDW 25.3 (*)    All other components within normal limits  BASIC METABOLIC PANEL - Abnormal; Notable for the following components:   CO2 33 (*)    Creatinine, Ser 2.11 (*)    GFR calc non Af Amer 21 (*)    GFR calc Af Amer 25 (*)    All other components within normal limits  POCT I-STAT EG7 - Abnormal; Notable for the following components:   pH, Ven 7.441 (*)    Bicarbonate 33.1 (*)    TCO2 35 (*)     Acid-Base Excess 8.0 (*)    Calcium, Ion 1.05 (*)    HCT 30.0 (*)    Hemoglobin 10.2 (*)    All other components within normal limits  POCT I-STAT EG7 - Abnormal; Notable for the following components:   Bicarbonate 32.3 (*)    TCO2 34 (*)    Acid-Base Excess 7.0 (*)    Sodium 148 (*)    Calcium, Ion 0.96 (*)    HCT 28.0 (*)    Hemoglobin 9.5 (*)    All other components  within normal limits  POCT I-STAT EG7 - Abnormal; Notable for the following components:   pH, Ven 7.445 (*)    Bicarbonate 34.6 (*)    TCO2 36 (*)    Acid-Base Excess 9.0 (*)    Calcium, Ion 1.13 (*)    HCT 31.0 (*)    Hemoglobin 10.5 (*)    All other components within normal limits  TROPONIN I (HIGH SENSITIVITY) - Abnormal; Notable for the following components:   Troponin I (High Sensitivity) 18 (*)    All other components within normal limits  SARS CORONAVIRUS 2 BY RT PCR (HOSPITAL ORDER, [place] [facility])  TSH  IRON AND TIBC  MAGNESIUM  MAGNESIUM  TROPONIN I (HIGH SENSITIVITY)    NM Pulmonary Perfusion  Final Result    DG CHEST PORT 1 VIEW  Final Result    DG Chest 2 View  Final Result      Medications  labetalol (NORMODYNE) injection 10 mg (has no administration in time range)  hydrALAZINE (APRESOLINE) injection 10 mg (has no administration in time range)  furosemide (LASIX) injection 80 mg (80 mg Intravenous Given 11/04/19 0945)  metolazone (ZAROXOLYN) tablet 2.5 mg (2.5 mg Oral Given 11/06/19 0920)  technetium albumin aggregated (MAA) injection solution 4.1 millicurie (4.1 millicuries Intravenous Contrast Given 11/06/19 1459)  aspirin chewable tablet 81 mg (81 mg Oral Given 11/09/19 0503)     Procedures  /  Critical Care Ultrasound ED Echo  Date/Time: 11/04/2019 9:45 AM Performed by: Maudie Flakes, MD Authorized by: Maudie Flakes, MD   Procedure details:    Indications: dyspnea     Views: subxiphoid, parasternal long axis view, parasternal short axis view and apical  4 chamber view     Images: archived     Limitations:  Acoustic shadowing Findings:    Pericardium: moderate pericardial effusion     LV Function: depressed (30 - 50%)     RV Diameter: normal    .Critical Care Performed by: Maudie Flakes, MD Authorized by: Maudie Flakes, MD   Critical care provider statement:    Critical care time (minutes):  35   Critical care was necessary to treat or prevent imminent or life-threatening deterioration of the following conditions:  Circulatory failure (Large pericardial effusion)   Critical care was time spent personally by me on the following activities:  Discussions with consultants, evaluation of patient's response to treatment, examination of patient, ordering and performing treatments and interventions, ordering and review of laboratory studies, ordering and review of radiographic studies, pulse oximetry, re-evaluation of patient's condition, obtaining history from patient or surrogate and review of old charts    ED Course and Medical Decision Making  I have reviewed the triage vital signs, the nursing notes, and pertinent available records from the EMR.  Listed above are laboratory and imaging tests that I personally ordered, reviewed, and interpreted and then considered in my medical decision making (see below for details).      Shortness of breath, anasarca, seems to be due to CHF. Patient was prescribed an increased dose of her torsemide, but when she was feeling worse she came to the hospital and now she has been without her medications for nearly 24 hours due to the time in the waiting room. She is in no acute distress on my exam with normal vital signs. Her most recent echo shows a large pericardial effusion, still present on my bedside exam today. Husband is adamant that they keep changing her  Lasix medication and its not helping. Will consult cardiology for recommendations with regard to fluid and how to manage this effusion.  Patient  admitted to cardiology for further care.  Barth Kirks. Sedonia Small, [place] [email or web address]  Final Clinical Impressions(s) / ED Diagnoses     ICD-10-CM   1. Acute on chronic congestive heart failure, unspecified heart failure type ([place])  I50.9   2. Pericardial effusion  I31.3   3. CHF (congestive heart failure) ([facility])  I50.9 DG CHEST PORT 1 VIEW    DG CHEST PORT 1 VIEW    ED Discharge Orders         Ordered    torsemide (DEMADEX) 20 MG tablet  2 times daily,   Status:  Discontinued       Note to Pharmacy: Please cancel all previous orders for current medication. Change in dosage or pill size.   11/11/19 1103    Diet - low sodium heart healthy        11/11/19 1103    Increase activity slowly        11/11/19 1103    (HEART FAILURE PATIENTS) Call MD:  Anytime you have any of the following symptoms: 1) 3 pound weight gain in 24 hours or 5 pounds in 1 week 2) shortness of breath, with or without a dry hacking cough 3) swelling in the hands, feet or stomach 4) if you have to sleep on extra pillows at night in order to breathe.        11/11/19 1103           Discharge Instructions Discussed with and Provided to Patient:   Discharge Instructions   None       Maudie Flakes, MD 11/04/19 1521    Maudie Flakes, MD 11/27/19 1739

## 2019-11-04 NOTE — H&P (Addendum)
Advanced Heart Failure Team History and Physical Note   PCP:  [name], MD  PCP-Cardiology: Dr. Aundra Dubin   Reason for Admission: Acute on Chronic Systolic Heart Failure and Pericardial Effusion    HPI:    79 y.o. with history of CKD 4, chronic atrial fibrillation, anemia, and chronic systolic CHF.  Was initially referred to [facility] by Levell July, NP for evaluation of CHF.  Patient has a complex past history.  She has a long history of atrial fibrillation, now chronic as she has failed DCCV and Tikosyn and did not tolerate amiodarone.  Her last echo in 6/21 showed EF 35-40% with mild RV dysfunction, large pericardial effusion without tamponade, moderate MR, moderate TR. Cause of cardiomyopathy is not certain.    She has been admitted multiple times this year to [facility]. She feels like hospitalizations have not helped her shortness of breath.  Most recently, she was admitted to [facility] 10/29/19.  She was found to be anemic with hgb 6.4, FOBT+.  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  Hgb was 7.5 at time of discharge.  Eliquis was helded for several days, she was told to restart it on 8/11.    She had f/u in [facility] on 8/9, the same day she was discharged from [name]. Was still SOB waking short distances and orthopneic. Denied CP. Was volume overloaded on exam. She was instructed to increase her home torsemide dose to 60 qam/40 qpm x 2 days then 40 mg bid (previous home regimen torsemide 20 mg bid). She was also scheduled for repeat echo to reassess her pericardial effusion.   Pt returned home and unfortunately did not increase her torsemide as instructed. Developed worsening dyspnea and now presents to the ED for further evaluation.   CXR shows moderate cardiomegaly, small pleural effusions and bilateral interstitial thickening suggestive of mild CHF. No evidence of PNA. F/u BNP not ordered (BNP on 8/5 was 1,507). Hs troponin 18>>17. CBC w/ stable anemia, Hgb 7.4 (unchanged from  recent hospital discharge). SCr 2.46 c/w baseline. K 4.2. EKG w/ low voltage diffusely c/w pericardial effusion. Rhythm undetermined. Voltage too low to see p-waves. HR 82 bpm. She is normotensive. SBPs 102-130.   2D Echo 09/18/19 1. Left ventricular ejection fraction, by estimation, is 35 to 40%. The left ventricle has moderately decreased function. The left ventricle demonstrates global hypokinesis. Left ventricular diastolic parameters are indeterminate. 2. Right ventricular systolic function is mildly reduced. The right ventricular size is mildly enlarged. There is normal pulmonary artery systolic pressure. The estimated right ventricular systolic pressure is 60.4 mmHg. 3. Left atrial size was moderately dilated. 4. Right atrial size was severely dilated. 5. Large pericardial effusion. The pericardial effusion is posterior to the left ventricle. No obvious RV compromise to suggest tamponade and there are signs of chronicity. Small anterior effusion. 6. The mitral valve is grossly normal. Moderate mitral valve regurgitation. 7. Tricuspid valve regurgitation is moderate. 8. The aortic valve is tricuspid. Aortic valve regurgitation is not visualized. Mild to moderate aortic valve sclerosis/calcification is present, without any evidence of aortic stenosis. Moderate nodular calcification of the noncoronary cusp. 9. The inferior vena cava is dilated in size with <50% respiratory variability, suggesting right atrial pressure of 15 mmHg.  Repeat Echo 8/11 Pending     Review of Systems: [y] = yes, [ ]  = no   General: Weight gain [ ] ; Weight loss [ ] ; Anorexia [ ] ; Fatigue [ ] ; Fever [ ] ;  Chills [ ] ; Weakness [ ]   Cardiac: Chest pain/pressure [ ] ; Resting SOB [ ] ; Exertional SOB [Y ]; Orthopnea [Y ]; Pedal Edema [ Y]; Palpitations [ ] ; Syncope [ ] ; Presyncope [ ] ; Paroxysmal nocturnal dyspnea[ ]   Pulmonary: Cough [ ] ; Wheezing[ ] ; Hemoptysis[ ] ; Sputum [ ] ; Snoring [ ]   GI: Vomiting[ ] ;  Dysphagia[ ] ; Melena[ ] ; Hematochezia [ ] ; Heartburn[ ] ; Abdominal pain [ ] ; Constipation [ ] ; Diarrhea [ ] ; BRBPR [ ]   GU: Hematuria[ ] ; Dysuria [ ] ; Nocturia[ ]   Vascular: Pain in legs with walking [ ] ; Pain in feet with lying flat [ ] ; Non-healing sores [ ] ; Stroke [ ] ; TIA [ ] ; Slurred speech [ ] ;  Neuro: Headaches[ ] ; Vertigo[ ] ; Seizures[ ] ; Paresthesias[ ] ;Blurred vision [ ] ; Diplopia [ ] ; Vision changes [ ]   Ortho/Skin: Arthritis [ ] ; Joint pain [ ] ; Muscle pain [ ] ; Joint swelling [ ] ; Back Pain [ ] ; Rash [ ]   Psych: Depression[ ] ; Anxiety[ ]   Heme: Bleeding problems [ ] ; Clotting disorders [ ] ; Anemia [ ]   Endocrine: Diabetes [ ] ; Thyroid dysfunction[ ]    Home Medications Prior to Admission medications   Medication Sig Start Date End Date Taking? Authorizing Provider  bisoprolol (ZEBETA) 5 MG tablet Take 0.5 tablets (2.5 mg total) by mouth daily. 09/24/19  Yes Emokpae, Courage, MD  loperamide (IMODIUM) 2 MG capsule Take 1 capsule (2 mg total) by mouth every 8 (eight) hours as needed for diarrhea or loose stools. 11/02/19  Yes Roxan Hockey, MD  acetaminophen (TYLENOL) 650 MG CR tablet Take 650 mg by mouth every 8 (eight) hours as needed for pain.    [provider]  albuterol (VENTOLIN HFA) 108 (90 Base) MCG/ACT inhaler Inhale 1-2 puffs into the lungs every 6 (six) hours as needed for wheezing or shortness of breath.    [provider]  ALPRAZolam Duanne Moron) 0.5 MG tablet Take 0.5 mg by mouth at bedtime as needed for anxiety.  06/18/14   [provider]  apixaban (ELIQUIS) 2.5 MG TABS tablet Take 1 tablet (2.5 mg total) by mouth 2 (two) times daily. Restart on Wednesday  11/04/19 11/04/19   Roxan Hockey, MD  calcium citrate-vitamin D (CITRACAL+D) 315-200 MG-UNIT tablet Take 1 tablet by mouth 2 (two) times daily.    [provider]  cetirizine HCl (ZYRTEC) 1 MG/ML solution Take 5 mLs (5 mg total) by mouth daily. 10/22/19   Wurst, [place], PA-C    fluticasone (FLONASE) 50 MCG/ACT nasal spray Place 2 sprays into both nostrils daily. 10/22/19   Wurst, [place], PA-C  omeprazole (PRILOSEC) 40 MG capsule Take 40 mg by mouth as needed.    [provider]  potassium chloride SA (KLOR-CON) 20 MEQ tablet Take 1 tablet (20 mEq total) by mouth 2 (two) times daily. Start on 09/28/2019 when you restart Lasix 11/02/19   Larey Dresser, MD  torsemide (DEMADEX) 20 MG tablet Take 2 tablets (40 mg total) by mouth 2 (two) times daily. 11/02/19   Larey Dresser, MD  traZODone (DESYREL) 50 MG tablet Take 1 tablet (50 mg total) by mouth at bedtime as needed for sleep. 09/24/19   Roxan Hockey, MD    Past Medical History: Past Medical History:  Diagnosis Date   Anxiety    Arthritis    Asthma    Atrial fibrillation ([place])    a. s/p unsuccessful DCCV in 01/2017 and intolerant to Amiodarone --> Rate-control strategy pursued since b. failed Tikosyn in 11/2017  Breast cancer ([place]) 2001   Right   Essential hypertension    GERD (gastroesophageal reflux disease)    T10 vertebral fracture [facility])     Past Surgical History: Past Surgical History:  Procedure Laterality Date   BIOPSY N/A 04/22/2018   Procedure: BIOPSY;  Surgeon: Aviva Signs, MD;  [facility];  Service: Gastroenterology;  Laterality: N/A;   BIOPSY  09/06/2019   Procedure: BIOPSY;  Surgeon: Daneil Dolin, MD;  Location: [facility];  Service: Endoscopy;;  gastric   CARDIOVERSION N/A 02/18/2017   Procedure: CARDIOVERSION;  Surgeon: Josue Hector, MD;  Location: [facility] ENDOSCOPY;  Service: Cardiovascular;  Laterality: N/A;   CARDIOVERSION N/A 12/04/2017   Procedure: CARDIOVERSION;  Surgeon: [name], MD;  Location: [place];  Service: Cardiovascular;  Laterality: N/A;   CATARACT EXTRACTION W/PHACO  07/23/2011   Procedure: CATARACT EXTRACTION PHACO AND INTRAOCULAR LENS PLACEMENT ([place]);  Surgeon: Williams Che, MD;  Location: [facility] ORS;  Service:  Ophthalmology;  Laterality: Right;  CDE:9.96   CATARACT EXTRACTION W/PHACO Left 11/13/2018   Procedure: CATARACT EXTRACTION PHACO AND INTRAOCULAR LENS PLACEMENT (IOC);  Surgeon: Baruch Goldmann, MD;  Location: [facility] ORS;  Service: Ophthalmology;  Laterality: Left;  left, CDE: 7.44   CHOLECYSTECTOMY N/A 02/14/2015   Procedure: LAPAROSCOPIC CHOLECYSTECTOMY;  Surgeon: Aviva Signs, MD;  Location: [facility] OR[facility];  Service: General;  Laterality: N/A;   COLONOSCOPY N/A 04/22/2018   Procedure: COLONOSCOPY;  Surgeon: Aviva Signs, MD;  [facility];  Service: Gastroenterology;  Laterality: N/A;   COLONOSCOPY WITH PROPOFOL N/A 09/06/2019   Procedure: COLONOSCOPY WITH PROPOFOL;  Surgeon: Daneil Dolin, MD;  [facility];  Service: Endoscopy;  Laterality: N/A;   ESOPHAGEAL DILATION  09/06/2019   Procedure: ESOPHAGEAL DILATION;  Surgeon: Daneil Dolin, MD;  [facility];  Service: Endoscopy;;   ESOPHAGOGASTRODUODENOSCOPY N/A 04/22/2018   Procedure: ESOPHAGOGASTRODUODENOSCOPY (EGD);  Surgeon: Aviva Signs, MD;  [facility];  Service: Gastroenterology;  Laterality: N/A;   ESOPHAGOGASTRODUODENOSCOPY (EGD) WITH PROPOFOL N/A 09/06/2019   Procedure: ESOPHAGOGASTRODUODENOSCOPY (EGD) WITH PROPOFOL;  Surgeon: Daneil Dolin, MD;  [facility];  Service: Endoscopy;  Laterality: N/A;   GIVENS CAPSULE STUDY N/A 10/31/2019   Procedure: GIVENS CAPSULE STUDY;  Surgeon: Harvel Quale, MD;  [facility];  Service: Gastroenterology;  Laterality: N/A;   MASTECTOMY     bilateral mastectomy-right breast cancer-left taken by choice   POLYPECTOMY  04/22/2018   Procedure: POLYPECTOMY;  Surgeon: Aviva Signs, MD;  [facility];  Service: Gastroenterology;;   POLYPECTOMY  09/06/2019   Procedure: POLYPECTOMY;  Surgeon: Daneil Dolin, MD;  [facility];  Service: Endoscopy;;  cecal, ascending, hepatic flexure, sigmoid   SEPTOPLASTY   1995    Family History:  Family History  Problem Relation Age of Onset   Cancer Mother    Aneurysm Father    Cancer Sister    Cancer Sister    Anesthesia problems Neg Hx    Hypotension Neg Hx    Malignant hyperthermia Neg Hx    Pseudochol deficiency Neg Hx    Colon cancer Neg Hx     Social History: Social History   Socioeconomic History   Marital status: Married    Spouse name: [name]   Number of children: Not on file   Years of education: Not on file   Highest education level: Not on file  Occupational History   Not on file  Tobacco  Use   Smoking status: Former Smoker    Packs/day: 1.00    Years: 28.00    Pack years: 28.00    Types: Cigarettes    Quit date: 07/17/1983    Years since quitting: 36.3   Smokeless tobacco: Never Used  Vaping Use   Vaping Use: Never used  Substance and Sexual Activity   Alcohol use: No   Drug use: No   Sexual activity: Not on file  Other Topics Concern   Not on file  Social History Narrative   Not on file   Social Determinants of Health   Financial Resource Strain:    Difficulty of Paying Living Expenses:   Food Insecurity:    Worried About Charity fundraiser in the Last Year:    Arboriculturist in the Last Year:   Transportation Needs: No Transportation Needs   Lack of Transportation (Medical): No   Lack of Transportation (Non-Medical): No  Physical Activity:    Days of Exercise per Week:    Minutes of Exercise per Session:   Stress:    Feeling of Stress :   Social Connections: Unknown   Frequency of Communication with Friends and Family: Not on file   Frequency of Social Gatherings with Friends and Family: Not on file   Attends Religious Services: Not on file   Active Member of Clubs or Organizations: Not on file   Attends Archivist Meetings: Not on file   Marital Status: Married    Allergies:  Allergies  Allergen Reactions   Cardizem Cd [Diltiazem Hcl Er Beads]  Diarrhea   Amiodarone Nausea And Vomiting   Metoprolol Tartrate Diarrhea   Sulfa Antibiotics Nausea And Vomiting   Tomato Other (See Comments)    Burns stomach   Carvedilol Diarrhea and Rash   Chocolate Rash and Other (See Comments)    Dark chocolate    Objective:    Vital Signs:   Temp:  [98.1 F (36.7 C)-98.7 F (37.1 C)] 98.6 F (37 C) (08/11 0808) Pulse Rate:  [75-94] 91 (08/11 0931) Resp:  [13-22] 17 (08/11 0931) BP: (102-130)/(46-105) 123/68 (08/11 0931) SpO2:  [94 %-100 %] 97 % (08/11 0931)   There were no vitals filed for this visit.   Physical Exam     General:  Elderly WF. No respiratory difficulty HEENT: Normal Neck: Supple. Neck veins distended JVD elevated to jawline. Carotids 2+ bilat; no bruits. No lymphadenopathy or thyromegaly appreciated. Cor: PMI nondisplaced. Irregularly irregular rthythm. No rubs, gallops or murmurs. Lungs: decreased BS at the bases bilaterally  Abdomen: Soft, mildly tender w/ palpation in lower abd, nondistended. No hepatosplenomegaly. No bruits or masses. Good bowel sounds. Extremities: No cyanosis, clubbing, rash, 2+ bilateral LE edema Neuro: Alert & oriented x 3, cranial nerves grossly intact. moves all 4 extremities w/o difficulty. Affect pleasant.   Telemetry   afib w/ PVCs 90s   EKG   low voltage diffusely c/w pericardial effusion. Rhythm undetermined. Voltage too low to see p-waves. HR 82 bpm.  Labs     Basic Metabolic Panel: Recent Labs  Lab 10/30/19 0630 10/30/19 0630 10/31/19 0744 10/31/19 0744 11/01/19 0735 11/01/19 0735 11/02/19 0602 11/02/19 1541 11/03/19 1235  NA 136   < > 137  --  139  --  138 139 139  K 3.8   < > 3.6  --  3.5  --  3.9 4.4 4.2  CL 96*   < > 98  --  98  --  100 100 102  CO2 25   < > 26  --  26  --  26 26 27   GLUCOSE 124*   < > 99  --  93  --  96 98 99  BUN 29*   < > 38*  --  41*  --  33* 28* 28*  CREATININE 2.67*   < > 2.90*  --  2.77*  --  2.35* 2.38* 2.46*  CALCIUM 8.5*    < > 8.9   < > 8.9   < > 8.9 9.1 9.2  MG 1.9  --   --   --   --   --   --   --   --   PHOS  --   --   --   --   --   --  2.4*  --   --    < > = values in this interval not displayed.    Liver Function Tests: Recent Labs  Lab 10/29/19 1707 11/02/19 0602  AST 24  --   ALT 9  --   ALKPHOS 89  --   BILITOT 1.8*  --   PROT 7.3  --   ALBUMIN 3.5 3.5   No results for input(s): LIPASE, AMYLASE in the last 168 hours. No results for input(s): AMMONIA in the last 168 hours.  CBC: Recent Labs  Lab 10/29/19 1707 10/29/19 1707 10/30/19 0630 10/31/19 0744 11/01/19 0735 11/02/19 0602 11/03/19 1235  WBC 9.8   < > 10.3 12.8* 7.7 6.2 7.3  NEUTROABS 8.6*  --   --   --   --   --   --   HGB 6.4*   < > 7.2* 7.6* 7.5* 7.5* 7.4*  HCT 21.7*   < > 24.2* 25.8* 25.7* 24.9* 24.8*  MCV 84.1   < > 83.4 83.0 82.9 83.3 83.8  PLT 198   < > 193 279 246 248 258   < > = values in this interval not displayed.    Cardiac Enzymes: No results for input(s): CKTOTAL, CKMB, CKMBINDEX, TROPONINI in the last 168 hours.  BNP: BNP (last 3 results) Recent Labs    09/18/19 1116 10/10/19 0907 10/29/19 1707  BNP 822.0* 1,768.0* 1,507.0*    ProBNP (last 3 results) No results for input(s): PROBNP in the last 8760 hours.   CBG: No results for input(s): GLUCAP in the last 168 hours.  Coagulation Studies: No results for input(s): LABPROT, INR in the last 72 hours.  Imaging: DG Chest 2 View  Result Date: 11/03/2019 CLINICAL DATA:  Afib x 2 weeks, SOB x 3-4 weeks ago, pt states she feels pressure on her chest, HTN, former smoker EXAM: CHEST - 2 VIEW COMPARISON:  10/29/2019 FINDINGS: Moderate enlargement of the cardiopericardial silhouette. No mediastinal or hilar masses. No evidence of adenopathy. Left upper lobe linear opacity noted consistent with atelectasis or scarring, similar to the prior study. Mild bilateral interstitial thickening is stable. There is lung base opacity consistent with atelectasis  associated with small effusions. No convincing pneumonia. Chronic appearing compression fractures at the thoracolumbar junction present on a chest CT dated 09/05/2019. No acute skeletal abnormality. IMPRESSION: 1. Moderate cardiomegaly, small effusions and bilateral interstitial thickening suggest mild congestive heart failure, similar to the prior chest radiograph. No evidence of pneumonia. Electronically Signed   By: Lajean Manes M.D.   On: 11/03/2019 13:04     Assessment/Plan   1. Acute on Chronic Systolic Heart Failure  -  Echo in  6/21 with EF 35-40% with mild RV dysfunction, large pericardial effusion without tamponade, moderate MR, moderate TR.  Cause of cardiomyopathy is not certain. LHC has been deferred given lack of ischemic CP and CKD.   - Admit for NYHA IIIb symptoms + volume overload.  - Plan IV Lasix 80 mg bid. If poor response, may need trial of metolazone vs lasix gtt given baseline CKD  - strict I/Os, daily wts and daily BMPs - place TEE hoses  - No digoxin, Entresto, spironolactone for now with CKD stage IV.  - may consider SGLT2i (GFR >25) - if BP remains stable, can add low dose Bidil  - repeat echo pending    2. Large Pericardial Effusion  - Large on 6/21 echo without tamponade.  - hemodynamically stable. Normotensive. No syncope/ near syncope - repeat stat limited echo. If significant progression/ tamponade physiology, may need therapeutic pericardiocentesis - diurese per above    - Check TSH   3. CKD, Stage IV - SCr 2.46 c/w baseline - monitor closely w/ diuresis   4. Chronic Anemia - Multifactorial, suspect due to renal disease as well as GI bleeding.  Hgb down to 6.5 recently in setting of GI bleeding with FOBT+. Refuses blood transfusions ([employer]). Recently recieved iron infusion.  - Hgb 7.4 today, stable over the last week  - check Fe, ferritin and TIBC. Can give feraheme if needed  - planning outpatient GI w/u (capsule endoscopy)  - No blood  products ([employer]).  5. Chronic Afib - has failed DCCV and Tikosyn.  She has not tolerated amiodarone.  Rate control good on bisoprolol 2.5 mg daily.  - Continue bisoprolol 2.5 daily.  - she has been off apixaban since her hospitalization at [facility] 8/5-8/9  - continue to hold apixaban for now until we decide on potential pericardiocentesis  - defer IV heparin to MD   6. Urinary Retention - has received 1x dose of 80 mg IV Lasix in ED w/ minimal UOP but "bladder feels full". Will bladder scan and place foley  - she has no other s/s suggestive of UTI   7. Moderate MR/Moderate TR - likely functional from BiV heart failure - diuresis w/ IV Lasix   8. VTE Prophylaxis - Eliquis on hold per above - order SCDs    Lyda Jester, PA-C 11/04/2019, 10:53 AM  Advanced Heart Failure Team Pager [phone] (M-F; 7a - 4p)  Please contact [facility] for night-coverage after hours (4p -7a ) and weekends on [email or web address]  Patient seen with PA, agree with the above note.   Please see my note from 11/02/19.  She returned to the ER with ongoing dyspnea and has been in the ER for about 24 hours.  Hgb 2.4, creatinine 2.46 yesterday, no labs yet today.  BP stable.  She overall feels ok at rest.  No chest pain.   Echo was done today and reviewed: EF 50% with septal HK, RV severely enlarged with mildly decreased systolic function and D-shaped septum.  Mild-moderate MR, severe TR with incomplete coaptation of tricuspid valve, IVC dilated. The pericardial effusion was moderate to large but primarily lateral and inferior, tamponade was not present.   General: NAD Neck: JVP 14-16 cm, no thyromegaly or thyroid nodule.  Lungs: Clear to auscultation bilaterally with normal respiratory effort. CV: Nondisplaced PMI.  Heart irregular S1/S2, no S3/S4, no murmur.  1+ edema to knees.  No carotid bruit.  Normal pedal pulses.  Abdomen: Soft, nontender, no hepatosplenomegaly, no distention.  Skin:  Intact without  lesions or rashes.  Neurologic: Alert and oriented x 3.  Psych: Normal affect. Extremities: No clubbing or cyanosis.  HEENT: Normal.   1. Acute on chronic heart failure with mid range EF and RV failure:  Echo this admission with EF 50% with septal HK, RV severely enlarged with mildly decreased systolic function and D-shaped septum.  Mild-moderate MR, severe TR with incomplete coaptation of tricuspid valve, IVC dilated. The pericardial effusion was moderate to large but primarily lateral and inferior, tamponade was not present.  Cause of cardiomyopathy primarily involving the RV is not certain.  PA pressure is not significantly elevated by echo. No chest pain.  She is quite volume overloaded on exam with NYHA class IIIb symptoms.   - Start with Lasix 80 mg IV bid and follow response and creatinine.  - [name].    - Continue bisoprolol 2.5 mg daily.  - No digoxin, Entresto, spironolactone for now with CKD stage IV.  - In absence of ACS, would not plan for coronary angiography given CKD stage IV. She will eventually need RHC.  - Cause of RV predominant failure uncertain, would get V/Q scan to look for chronic PEs though echo measurement for PA pressure is not significantly elevated.  2. Atrial fibrillation: Chronic, has failed DCCV and Tikosyn.  She has not tolerated amiodarone.  Rate control good on bisoprolol 2.5 mg daily.  - Continue bisoprolol 2.5 daily.  - Holding apixaban for now with significant anemia.  3. Anemia: Multifactorial, suspect due to renal disease as well as GI bleeding.  Hgb down to 6.5 recently in setting of GI bleeding with FOBT+.  Hgb up to 7.4 today.    - To followup with GI for capsule endoscopy eventually.  - No blood products ([employer]).  - Check Fe studies.  4. CKD: Stage IV.  Watch creatinine closely with diuresis.  - She has a nephrologist.  5. Pericardial effusion: Large on 6/21 echo without tamponade.  Today's echo shows moderate to large primarily  lateral and inferior effusion without tamponade.   [name] 11/04/2019 3:34 PM

## 2019-11-04 NOTE — Progress Notes (Signed)
Orthopedic Tech Progress Note Patient Details:  Bailey Bishop February 11, 1941 [id number] Patient [name] will be applied as PA requested on certain days 7/42. Went and had a conversation with Therapist, sports. [name], female   DOB: Apr 24, 1940, 79 y.o.   MRN: [id number]   Bailey Bishop 11/04/2019, 5:03 PM

## 2019-11-04 NOTE — Progress Notes (Signed)
°  Echocardiogram 2D Echocardiogram has been performed.  Bailey Bishop 11/04/2019, 11:42 AM

## 2019-11-05 ENCOUNTER — Encounter (HOSPITAL_COMMUNITY): Payer: Medicare Other | Admitting: Cardiology

## 2019-11-05 DIAGNOSIS — I5023 Acute on chronic systolic (congestive) heart failure: Secondary | ICD-10-CM | POA: Diagnosis not present

## 2019-11-05 LAB — CBC
HCT: 25.8 % — ABNORMAL LOW (ref 36.0–46.0)
Hemoglobin: 7.5 g/dL — ABNORMAL LOW (ref 12.0–15.0)
MCH: 24.4 pg — ABNORMAL LOW (ref 26.0–34.0)
MCHC: 29.1 g/dL — ABNORMAL LOW (ref 30.0–36.0)
MCV: 83.8 fL (ref 80.0–100.0)
Platelets: 229 10*3/uL (ref 150–400)
RBC: 3.08 MIL/uL — ABNORMAL LOW (ref 3.87–5.11)
RDW: 21.8 % — ABNORMAL HIGH (ref 11.5–15.5)
WBC: 6.7 10*3/uL (ref 4.0–10.5)
nRBC: 0 % (ref 0.0–0.2)

## 2019-11-05 LAB — BASIC METABOLIC PANEL
Anion gap: 12 (ref 5–15)
BUN: 24 mg/dL — ABNORMAL HIGH (ref 8–23)
CO2: 28 mmol/L (ref 22–32)
Calcium: 9 mg/dL (ref 8.9–10.3)
Chloride: 101 mmol/L (ref 98–111)
Creatinine, Ser: 2.55 mg/dL — ABNORMAL HIGH (ref 0.44–1.00)
GFR calc Af Amer: 20 mL/min — ABNORMAL LOW (ref >60)
GFR calc non Af Amer: 17 mL/min — ABNORMAL LOW (ref >60)
Glucose, Bld: 89 mg/dL (ref 70–99)
Potassium: 4.1 mmol/L (ref 3.5–5.1)
Sodium: 141 mmol/L (ref 135–145)

## 2019-11-05 LAB — MAGNESIUM: Magnesium: 2.1 mg/dL (ref 1.7–2.4)

## 2019-11-05 MED ORDER — ENOXAPARIN SODIUM 30 MG/0.3ML ~~LOC~~ SOLN
30.0000 mg | SUBCUTANEOUS | Status: DC
  Administered 2019-11-05: 30 mg via SUBCUTANEOUS
  Filled 2019-11-05: qty 0.3

## 2019-11-05 MED ORDER — QUETIAPINE FUMARATE 25 MG PO TABS
25.0000 mg | ORAL_TABLET | Freq: Every day | ORAL | Status: DC
  Administered 2019-11-05 – 2019-11-10 (×6): 25 mg via ORAL
  Filled 2019-11-05 (×6): qty 1

## 2019-11-05 NOTE — Progress Notes (Addendum)
Upon morning assessment, patient only oriented to self, disoriented X3. Patient is aggressively refusing to let this RN assess lung and heart sounds, skin, and most of head to toe assessment; becoming agitated as bed alarm sounds off, has fully dressed herself in night gown, slippers, and jacket from home. Patient is also refusing medications, has removed 2 peripheral IVs, husband currently at bedside and is requesting to take patient home and speak with MD. Francee Gentile PA paged and states she will come to bedside speak with patient husband. Patient refusing telemetry monitoring.  Upon second attempt at head to toe assessment, patient's husband able to persuade patient to let this RN assess incomplete parts of assessment- has been documented in flowsheets. Patient mental status has not changed from previous assessment. Patient's husband also able to persuade patient to take morning scheduled PO medications. No PIV access available at this time to give scheduled morning dose of IV Lasix. Husband at bedside states they are currently waiting to speak with Aundra Dubin, MD.

## 2019-11-05 NOTE — Progress Notes (Signed)
Orthopedic Tech Progress Note Patient Details:  Bailey Bishop 07-21-1940 [id number]  Ortho Devices Type of Ortho Device: Haematologist Ortho Device/Splint Location: Bilateral Ortho Device/Splint Interventions: Application, Ordered   Post Interventions Patient Tolerated: Well Instructions Provided: Care of device   Bailey Bishop A Maan Zarcone 11/05/2019, 12:43 PM

## 2019-11-05 NOTE — Progress Notes (Signed)
Patient and husband agreeable to patient staying in hospital after speaking with MD. Patient currently lying in bed, telemetry monitoring on, bed alarm and yellow fall risk socks are on, peripheral IV inserted in left forearm and morning scheduled Lasix given. Patient's husband still at bedside and states he will stay as long as possible. Patient continues to be alert and oriented to self only, disoriented X3. Call bell and phone within reach, purse and glasses at bedside within reach.

## 2019-11-05 NOTE — Progress Notes (Addendum)
Advanced Heart Failure Rounding Note  PCP-Cardiologist: No primary care provider on file.   Subjective:    Suboptimal response to 81 IV Lasix yesterday. Only 425 cc charted.  Refusing therapies today. Pulled her IV out. No lasix given this am. Refussing tele. Delirious this morning and hostile.  Out of hospital gown and in home clothes sitting up on side of bed. Adamant that she wants to go home today.   Hgb remains low but stable at 7.5. Denies resting dyspnea. BP stable.    Objective:   Weight Range: 65.1 kg Body mass index is 24.65 kg/m.   Vital Signs:   Temp:  [98 F (36.7 C)-98.6 F (37 C)] 98.6 F (37 C) (08/12 0311) Pulse Rate:  [80-91] 83 (08/12 0311) Resp:  [16-24] 19 (08/12 0311) BP: (105-133)/(40-78) 111/61 (08/12 0311) SpO2:  [93 %-99 %] 94 % (08/12 0311) Weight:  [65.1 kg-65.5 kg] 65.1 kg (08/12 0311) Last BM Date: 11/04/19  Weight change: Filed Weights   11/04/19 1646 11/05/19 0311  Weight: 65.5 kg 65.1 kg    Intake/Output:   Intake/Output Summary (Last 24 hours) at 11/05/2019 1026 Last data filed at 11/05/2019 6812 Gross per 24 hour  Intake 180 ml  Output 425 ml  Net -245 ml      Physical Exam    General:  Elderly female, sitting up on side of bed. No resp difficulty HEENT: Normal Neck: Supple. JVP elevated to jawline. Carotids 2+ bilat; no bruits. No lymphadenopathy or thyromegaly appreciated. Cor: PMI nondisplaced. irregularly irregular rhythm, regular rate. No rubs, gallops or murmurs. Lungs: decreased BS at the bases bilaterally  Abdomen: Soft, nontender, nondistended. No hepatosplenomegaly. No bruits or masses. Good bowel sounds. Extremities: No cyanosis, clubbing, rash, 1+ bilateral LE edema Neuro: Alert & orientedx3, cranial nerves grossly intact. moves all 4 extremities w/o difficulty. Affect pleasant   Telemetry   Currently off tele. Pt refusing   EKG    Atrial Fibrillation 81 bpm, low voltage   Labs    CBC Recent Labs     11/04/19 1640 11/05/19 0448  WBC 7.4 6.7  HGB 7.7* 7.5*  HCT 26.5* 25.8*  MCV 84.7 83.8  PLT 254 751   Basic Metabolic Panel Recent Labs    11/04/19 1640 11/05/19 0448  NA 141 141  K 4.2 4.1  CL 100 101  CO2 28 28  GLUCOSE 90 89  BUN 26* 24*  CREATININE 2.43* 2.55*  CALCIUM 9.2 9.0  MG  --  2.1   Liver Function Tests No results for input(s): AST, ALT, ALKPHOS, BILITOT, PROT, ALBUMIN in the last 72 hours. No results for input(s): LIPASE, AMYLASE in the last 72 hours. Cardiac Enzymes No results for input(s): CKTOTAL, CKMB, CKMBINDEX, TROPONINI in the last 72 hours.  BNP: BNP (last 3 results) Recent Labs    10/10/19 0907 10/29/19 1707 11/04/19 1700  BNP 1,768.0* 1,507.0* 1,511.3*    ProBNP (last 3 results) No results for input(s): PROBNP in the last 8760 hours.   D-Dimer No results for input(s): DDIMER in the last 72 hours. Hemoglobin A1C No results for input(s): HGBA1C in the last 72 hours. Fasting Lipid Panel No results for input(s): CHOL, HDL, LDLCALC, TRIG, CHOLHDL, LDLDIRECT in the last 72 hours. Thyroid Function Tests Recent Labs    11/04/19 1700  TSH 1.887    Other results:   Imaging    ECHOCARDIOGRAM COMPLETE  Result Date: 11/04/2019    ECHOCARDIOGRAM REPORT   Patient Name:   Bailey Bishop  Kristian Covey Date of Exam: 11/04/2019 Medical Rec #:  [id number]          Height:       64.0 in Accession #:    [id number]         Weight:       150.2 lb Date of Birth:  [birth year]          BSA:          1.732 m Patient Age:    79 years           BP:           123/68 mmHg Patient Gender: F                  HR:           91 bpm. Exam Location:  Inpatient Procedure: 2D Echo STAT ECHO Indications:    pericardial effusion  History:        Patient has prior history of Echocardiogram examinations, most                 recent 09/18/2019. Arrythmias:Atrial Fibrillation; Risk                 Factors:Hypertension and Former Smoker.  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 IMPRESSIONS  1. Left ventricular ejection fraction, by estimation, is 50%. The left ventricle has mildly decreased function. The left ventricle demonstrates regional wall motion abnormalities with septal hypokinesis. Left ventricular diastolic parameters are indeterminate due to atrial fibrillation.  2. Right ventricular systolic function is mildly reduced. The right ventricular size is severely enlarged. D-shaped interventricular septum suggests RV pressure/volume overload.  3. The mitral valve is normal in structure. Mild to moderate mitral valve regurgitation. No evidence of mitral stenosis.  4. Incomplete coaptation of the tricuspid valve. Tricuspid valve regurgitation is severe. Estimated PA systolic pressure 32 mmHg.  5. The aortic valve is tricuspid. Aortic valve regurgitation is not visualized. Mild to moderate aortic valve sclerosis/calcification is present, without any evidence of aortic stenosis.  6. Right atrial size was severely dilated.  7. The inferior vena cava is dilated in size with <50% respiratory variability, suggesting right atrial pressure of 15 mmHg.  8. Moderate to large primarily posterior and inferior pericardial effusion. I do not think tamponade is present. The IVC is dilated but there is no RV diastolic collapse and there is <25% respirophasic variation of mitral E inflow doppler signal. FINDINGS  Left Ventricle: Left ventricular ejection fraction, by estimation, is 50%. The left ventricle has mildly decreased function. The left ventricle demonstrates regional wall motion abnormalities. The left ventricular internal cavity size was normal in size. There is no left ventricular hypertrophy. Left ventricular diastolic parameters are indeterminate. Right Ventricle: The right ventricular size is severely enlarged. No increase in right ventricular wall thickness. Right ventricular  systolic function is mildly reduced. There is normal pulmonary artery systolic pressure. The tricuspid regurgitant velocity is 2.05 m/s, and with an assumed right atrial pressure of 15 mmHg, the estimated right ventricular systolic pressure is 10.2 mmHg. Left Atrium: Left atrial size was normal in size. Right Atrium: Right atrial size was severely dilated. Pericardium: Moderate-large primarily posterior and inferior pericardial effusion. A large pericardial effusion is present. Mitral Valve: The mitral valve is normal in structure. There is mild calcification of the mitral valve leaflet(s). Mild mitral annular calcification. Mild to moderate mitral  valve regurgitation. No evidence of mitral valve stenosis. Tricuspid Valve: Incomplete coaptation of the tricuspid valve. The tricuspid valve is abnormal. Tricuspid valve regurgitation is severe. Aortic Valve: The aortic valve is tricuspid. Aortic valve regurgitation is not visualized. Mild to moderate aortic valve sclerosis/calcification is present, without any evidence of aortic stenosis. Pulmonic Valve: The pulmonic valve was not well visualized. Pulmonic valve regurgitation is not visualized. Aorta: The aortic root is normal in size and structure. Venous: The inferior vena cava is dilated in size with less than 50% respiratory variability, suggesting right atrial pressure of 15 mmHg. IAS/Shunts: No atrial level shunt detected by color flow Doppler.  LEFT VENTRICLE PLAX 2D LVIDd:         4.03 cm LVIDs:         1.88 cm LV PW:         0.95 cm LV IVS:        0.87 cm LVOT diam:     1.80 cm LV SV:         44 LV SV Index:   25 LVOT Area:     2.54 cm  RIGHT VENTRICLE RV S prime:     9.68 cm/s TAPSE (M-mode): 1.0 cm LEFT ATRIUM             Index       RIGHT ATRIUM           Index LA diam:        4.00 cm 2.31 cm/m  RA Area:     32.40 cm LA Vol (A2C):   45.0 ml 25.98 ml/m RA Volume:   105.00 ml 60.62 ml/m LA Vol (A4C):   26.5 ml 15.30 ml/m LA Biplane Vol: 35.5 ml 20.49  ml/m  AORTIC VALVE LVOT Vmax:   83.25 cm/s LVOT Vmean:  57.850 cm/s LVOT VTI:    0.173 m  AORTA Ao Root diam: 2.60 cm MITRAL VALVE               TRICUSPID VALVE MV Area (PHT): 6.17 cm    TR Peak grad:   16.8 mmHg MV Decel Time: 123 msec    TR Vmax:        205.00 cm/s MV E velocity: 87.40 cm/s                            SHUNTS                            Systemic VTI:  0.17 m                            Systemic Diam: 1.80 cm [name] MD Electronically signed by [name] MD Signature Date/Time: 11/04/2019/12:59:36 PM    Final       Medications:     Scheduled Medications: . bisoprolol  2.5 mg Oral Daily  . furosemide  80 mg Intravenous BID  . potassium chloride  20 mEq Oral BID  . sodium chloride flush  3 mL Intravenous Q12H     Infusions: . sodium chloride       PRN Medications:  sodium chloride, acetaminophen, ondansetron (ZOFRAN) IV, sodium chloride flush   Assessment/Plan   1. Acute on chronic heart failure with mid range EF and RV failure:  Echo this admission with EF 50% with septal HK, RV severely enlarged with mildly decreased systolic  function and D-shaped septum.  Mild-moderate MR, severe TR with incomplete coaptation of tricuspid valve, IVC dilated. The pericardial effusion was moderate to large but primarily lateral and inferior, tamponade was not present. Cause of cardiomyopathy primarily involving the RV is not certain.  PA pressure is not significantly elevated by echo. No chest pain. She remains quite volume overloaded on exam with NYHA class IIIb symptoms but wants to go home and refusing additional hospital treatment. Pulled out IV and refused IV Lasix today  - Can try further increasing her home torsemide to 60 qam/40 qpm x 2 days then 40 mg bid (previously only on 20 mg bid). Unfortunately since she lives in [facility], she is out of range for [facility] program for home IV Lasix.  - Continue bisoprolol 2.5 mg daily.  - No digoxin,  Entresto, spironolactone for now with CKD stage IV.  - In absence of ACS, would not plan for coronary angiography given CKD stage IV. Recommend RHC, but doubt pt will consent.  - Cause of RV predominant failure uncertain, recommend V/Q scan to look for chronic PEs though echo measurement for PA pressure is not significantly elevated. Pt unwilling to complete study today.  2. Atrial fibrillation: Chronic, has failed DCCV and Tikosyn. She has not tolerated amiodarone. Rate control good on bisoprolol 2.5 mg daily.  - Continue bisoprolol 2.5 daily.  - Holding apixaban for now with significant anemia.  3. Anemia: Multifactorial, suspect due to renal disease as well as GI bleeding. Hgb down to 6.5 recently in setting of GI bleeding with FOBT+. Hgb up to 7.5 today.  - To followup with GI for capsule endoscopy eventually.  - No blood products ([employer]).  - Fe studies ok, Ferritin 311, Fe 74, Sats 20% 4. CKD: Stage IV. SCr up slightly after dose of IV Lasix, 2.43>>2.55 - She has a nephrologist.  5. Pericardial effusion: Large on 6/21 echo without tamponade.  Repeat echo yesterday showed moderate to large primarily lateral and inferior effusion without tamponade.  - hemodynamically stable. TSH WNL   Length of Stay: 1  Brittainy Simmons, PA-C  11/05/2019, 10:26 AM  Advanced Heart Failure Team Pager [phone] (M-F; 7a - 4p)  Please contact [facility] for night-coverage after hours (4p -7a ) and weekends on [email or web address]  Patient seen with PA, agree with the above note.   She became delirious and combative overnight, she is much better currently.  She is oriented to place/person/time.  Her husband is here with her.  We talked about the benefits of remaining in the hospital for treatment and she agrees.    Denies dyspnea at rest.   General: NAD Neck: JVP 16+ cm, no thyromegaly or thyroid nodule.  Lungs: Clear to auscultation bilaterally with normal respiratory effort. CV:  Nondisplaced PMI.  Heart irregular S1/S2, no S3/S4, 2/6 HSM LLSB.  1+ edema to thighs.   Abdomen: Soft, nontender, no hepatosplenomegaly, no distention.  Skin: Intact without lesions or rashes.  Neurologic: Alert and oriented x 3.  Psych: Normal affect. Extremities: No clubbing or cyanosis.  HEENT: Normal.   Creatinine 2.55 today, mildly higher.  I/Os not recorded in ER.  Still very volume overloaded, as above seems primarily right-sided CHF with severe TR.  - Continue Lasix 80 mg IV bid today, give 2 doses and follow response closely.  - RHC may be helpful but not sure she would agree.  - With RV failure, plan to get V/Q scan to assess for chronic  PE.  - [name].   Has chronic afib, off apixaban for the time being with recent admission with symptomatic anemia.  If hgb remains stable (stable at 7.5 today), can restart at 2.5 mg bid.  - She does not appear to be actively bleeding so will use Lovenox DVT prophylaxis for now.   Delirium overnight, now clear.  May be sundowning. - Have husband here as much as possible to re-orient.  - CXR w/o PNA at admission.  - Check UA/culture.   - Can use low dose Seroquel at bedtime.   [name] 11/05/2019 12:51 PM

## 2019-11-05 NOTE — Progress Notes (Signed)
Spoke with PT husband. He was very upset by patient phone call this morning.  Husband thinks it would be good idea to bring son to visit as Pt suspicious of husband.

## 2019-11-05 NOTE — Progress Notes (Signed)
Pt woke up confused and paranoid. Pt refusing treatment at this time. Pt states she has her own medications in her purse. Staff did not see medication in PT belongings.  PT removed tele leads.  Pt refuses telemetry monitoring. Pt refuses SCD.   Attempted to contact husband to calm patient.  Pt speaking to husband now.

## 2019-11-06 ENCOUNTER — Encounter (HOSPITAL_COMMUNITY): Payer: Self-pay | Admitting: Cardiology

## 2019-11-06 ENCOUNTER — Inpatient Hospital Stay (HOSPITAL_COMMUNITY): Payer: Medicare Other

## 2019-11-06 DIAGNOSIS — I5023 Acute on chronic systolic (congestive) heart failure: Secondary | ICD-10-CM | POA: Diagnosis not present

## 2019-11-06 LAB — CBC
HCT: 26.3 % — ABNORMAL LOW (ref 36.0–46.0)
Hemoglobin: 7.8 g/dL — ABNORMAL LOW (ref 12.0–15.0)
MCH: 25.3 pg — ABNORMAL LOW (ref 26.0–34.0)
MCHC: 29.7 g/dL — ABNORMAL LOW (ref 30.0–36.0)
MCV: 85.4 fL (ref 80.0–100.0)
Platelets: 221 10*3/uL (ref 150–400)
RBC: 3.08 MIL/uL — ABNORMAL LOW (ref 3.87–5.11)
RDW: 23 % — ABNORMAL HIGH (ref 11.5–15.5)
WBC: 5.6 10*3/uL (ref 4.0–10.5)
nRBC: 0 % (ref 0.0–0.2)

## 2019-11-06 LAB — BASIC METABOLIC PANEL
Anion gap: 10 (ref 5–15)
BUN: 21 mg/dL (ref 8–23)
CO2: 30 mmol/L (ref 22–32)
Calcium: 9.1 mg/dL (ref 8.9–10.3)
Chloride: 102 mmol/L (ref 98–111)
Creatinine, Ser: 2.4 mg/dL — ABNORMAL HIGH (ref 0.44–1.00)
GFR calc Af Amer: 21 mL/min — ABNORMAL LOW (ref >60)
GFR calc non Af Amer: 18 mL/min — ABNORMAL LOW (ref >60)
Glucose, Bld: 98 mg/dL (ref 70–99)
Potassium: 4.1 mmol/L (ref 3.5–5.1)
Sodium: 142 mmol/L (ref 135–145)

## 2019-11-06 LAB — MAGNESIUM: Magnesium: 2.1 mg/dL (ref 1.7–2.4)

## 2019-11-06 MED ORDER — METOLAZONE 2.5 MG PO TABS
2.5000 mg | ORAL_TABLET | Freq: Once | ORAL | Status: AC
  Administered 2019-11-06: 2.5 mg via ORAL
  Filled 2019-11-06: qty 1

## 2019-11-06 MED ORDER — APIXABAN 2.5 MG PO TABS: 2.5000 mg | ORAL_TABLET | Freq: Two times a day (BID) | ORAL | Status: DC

## 2019-11-06 MED ORDER — HALOPERIDOL LACTATE 5 MG/ML IJ SOLN
1.0000 mg | Freq: Once | INTRAMUSCULAR | Status: DC
  Filled 2019-11-06: qty 1

## 2019-11-06 MED ORDER — TECHNETIUM TO 99M ALBUMIN AGGREGATED
4.1000 | Freq: Once | INTRAVENOUS | Status: AC | PRN
  Administered 2019-11-06: 4.1 via INTRAVENOUS

## 2019-11-06 NOTE — Progress Notes (Signed)
Various attempts have been made to keep patient on monitor including changing devices, leads, stickers, etc. Each falsely reports bradycardia before reaching asystole. Patient is currently on standby.

## 2019-11-06 NOTE — Progress Notes (Addendum)
Advanced Heart Failure Rounding Note  PCP-Cardiologist: No primary care provider on file.   Subjective:    -1.4L out yesterday w/ BID IV Lasix. Remains volume overloaded but breathing ok, primarily R>L sided HF.  Scr down some w/ diuresis, 2.55>>2.40.   Hgb stable at 7.8.   Chronic afib. HR 70s.   Delirious yesterday but much improved today. Husband present at bedside. A&Ox3.    Objective:   Weight Range: 64.9 kg Body mass index is 24.56 kg/m.   Vital Signs:   Temp:  [97.9 F (36.6 C)-98.6 F (37 C)] 97.9 F (36.6 C) (08/13 0723) Pulse Rate:  [71-120] 120 (08/13 0318) Resp:  [14-24] 16 (08/13 0723) BP: (108-127)/(55-82) 127/82 (08/13 0854) SpO2:  [95 %-96 %] 96 % (08/13 0318) Weight:  [64.9 kg] 64.9 kg (08/13 0318) Last BM Date: 11/04/19  Weight change: Filed Weights   11/04/19 1646 11/05/19 0311 11/06/19 0318  Weight: 65.5 kg 65.1 kg 64.9 kg    Intake/Output:   Intake/Output Summary (Last 24 hours) at 11/06/2019 1123 Last data filed at 11/06/2019 1000 Gross per 24 hour  Intake 883 ml  Output 1600 ml  Net -717 ml      Physical Exam    General:  Elderly female resting in bed. No resp difficulty HEENT: Normal Neck: Supple. JVP elevated to jaw Carotids 2+ bilat; no bruits. No lymphadenopathy or thyromegaly appreciated. Cor: PMI nondisplaced. irregularly irregular rhythm, regular rate. No rubs, gallops or murmurs. Lungs: decreased BS at the bases bilaterally  Abdomen: Soft, nontender, nondistended. No hepatosplenomegaly. No bruits or masses. Good bowel sounds. Extremities: No cyanosis, clubbing, rash, trace-1+ bilateral LE edema + unna boots Neuro: Alert & orientedx3, cranial nerves grossly intact. moves all 4 extremities w/o difficulty. Affect pleasant   Telemetry   Afib 70s   EKG    No new EKG to review   Labs    CBC Recent Labs    11/05/19 0448 11/06/19 0433  WBC 6.7 5.6  HGB 7.5* 7.8*  HCT 25.8* 26.3*  MCV 83.8 85.4  PLT 229 505    Basic Metabolic Panel Recent Labs    11/05/19 0448 11/06/19 0433  NA 141 142  K 4.1 4.1  CL 101 102  CO2 28 30  GLUCOSE 89 98  BUN 24* 21  CREATININE 2.55* 2.40*  CALCIUM 9.0 9.1  MG 2.1 2.1   Liver Function Tests No results for input(s): AST, ALT, ALKPHOS, BILITOT, PROT, ALBUMIN in the last 72 hours. No results for input(s): LIPASE, AMYLASE in the last 72 hours. Cardiac Enzymes No results for input(s): CKTOTAL, CKMB, CKMBINDEX, TROPONINI in the last 72 hours.  BNP: BNP (last 3 results) Recent Labs    10/10/19 0907 10/29/19 1707 11/04/19 1700  BNP 1,768.0* 1,507.0* 1,511.3*    ProBNP (last 3 results) No results for input(s): PROBNP in the last 8760 hours.   D-Dimer No results for input(s): DDIMER in the last 72 hours. Hemoglobin A1C No results for input(s): HGBA1C in the last 72 hours. Fasting Lipid Panel No results for input(s): CHOL, HDL, LDLCALC, TRIG, CHOLHDL, LDLDIRECT in the last 72 hours. Thyroid Function Tests Recent Labs    11/04/19 1700  TSH 1.887    Other results:   Imaging    No results found.   Medications:     Scheduled Medications: . bisoprolol  2.5 mg Oral Daily  . enoxaparin (LOVENOX) injection  30 mg Subcutaneous Q24H  . furosemide  80 mg Intravenous BID  . haloperidol  lactate  1 mg Intravenous Once  . potassium chloride  20 mEq Oral BID  . QUEtiapine  25 mg Oral QHS  . sodium chloride flush  3 mL Intravenous Q12H    Infusions: . sodium chloride      PRN Medications: sodium chloride, acetaminophen, ondansetron (ZOFRAN) IV, sodium chloride flush   Assessment/Plan   1. Acute on chronic heart failure with mid range EF and RV failure:  Echo this admission with EF 50% with septal HK, RV severely enlarged with mildly decreased systolic function and D-shaped septum.  Mild-moderate MR, severe TR with incomplete coaptation of tricuspid valve, IVC dilated. The pericardial effusion was moderate to large but primarily lateral  and inferior, tamponade was not present. Cause of cardiomyopathy primarily involving the RV is not certain.  PA pressure is not significantly elevated by echo. No chest pain. Had some diuresis yesterday w/ IV Lasix but only 1.4L in UOP. Remains fluid overloaded,  seems primarily right-sided CHF with severe TR. SCr improving w/diuresis - Continue 80 IV lasix BID today  - add metolazone 2.5 mg x 1 today  - continue unna boots  - Continue bisoprolol 2.5 mg daily.  - No digoxin, Entresto, spironolactone for now with CKD stage IV.  - In absence of ACS, would not plan for coronary angiography given CKD stage IV.  - RHC may be helpful but not sure she would agree.  - Cause of RV predominant failure uncertain, recommend V/Q scan to look for chronic PEs  2. Atrial fibrillation: Chronic, has failed DCCV and Tikosyn. She has not tolerated amiodarone. Rate control good on bisoprolol 2.5 mg daily.  - Continue bisoprolol 2.5 daily.  - Hgb has been stable, 7.8 today. No signs of active bleeding.  - resume Eliquis 2.5 mg bid today  3. Anemia: Multifactorial, suspect due to renal disease as well as GI bleeding. Hgb down to 6.5 recently in setting of GI bleeding with FOBT+. Hgb up to 7.8 today.  - To followup with GI for capsule endoscopy eventually.  - No blood products (J[employer]ovah's [employer]).  - Fe studies ok, Ferritin 311, Fe 74, Sats 20% 4. CKD: Stage IV. SCr trending down w/ diuresis, 2.55>>2.40 today  - followed by outpatient nephrology  5. Pericardial effusion: Large on 6/21 echo without tamponade.  Repeat echo 8/11 showed moderate to large primarily lateral and inferior effusion without tamponade.  - hemodynamically stable. TSH WNL  6. Delirium  - much improved today  - husband present at beside, encouraged frequent re-orientation  - continue Seroquel at bedtime.  Length of Stay: 2  Bailey Jester, PA-C  11/06/2019, 11:23 AM  Advanced Heart Failure Team Pager [phone] (M-F; 7a - 4p)   Please contact [facility] for night-coverage after hours (4p -7a ) and weekends on [email or web address]  Patient seen with PA, agree with the above note.   She appears to have diuresed some yesterday, not everything was recorded.  Breathing is better.  Creatinine lower at 2.4. Clear this morning, no e/o delirium (husband is here).   General: NAD Neck:JVP 14 cm, no thyromegaly or thyroid nodule.  Lungs: Clear to auscultation bilaterally with normal respiratory effort. CV: Nondisplaced PMI.  Heart regular S1/S2, no S3/S4, no murmur.  1+ edema to knees.   Abdomen: Soft, nontender, no hepatosplenomegaly, no distention.  Skin: Intact without lesions or rashes.  Neurologic: Alert and oriented x 3.  Psych: Normal affect. Extremities: [name]  HEENT: Normal.   Still volume overloaded, creatinine stable.  Will give  Lasix 80 mg IV bid today with dose of metolazone 2.5 x 1.  RHC would be helpful eventually if she would agree.   V/Q scan for chronic PEs with predominant RV failure.   Hgb has been stably low.  With recent overt GI bleeding without source found and low hgb, think that we should keep her off apixaban for the time being.  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   Delirium improved, getting qhs Seroquel and husband with her.   [name] 11/06/2019 12:26 PM

## 2019-11-06 NOTE — Progress Notes (Addendum)
Patient has become agitated. Assessed patient when bed alarm activated to find her standing next to her bed. Patient stated "give me some clothes or kill me". Patient then wandered to bedside table, picked up several cups of water, and began throwing them. Patient then went limp. Secured patient by bilateral axilla whereupon she attempted to bite RN arms. Directed patient to chair.  MD notified. See new orders.

## 2019-11-07 ENCOUNTER — Other Ambulatory Visit: Payer: Self-pay

## 2019-11-07 DIAGNOSIS — I50813 Acute on chronic right heart failure: Secondary | ICD-10-CM

## 2019-11-07 DIAGNOSIS — I4821 Permanent atrial fibrillation: Secondary | ICD-10-CM

## 2019-11-07 LAB — BASIC METABOLIC PANEL
Anion gap: 12 (ref 5–15)
BUN: 20 mg/dL (ref 8–23)
CO2: 31 mmol/L (ref 22–32)
Calcium: 9.3 mg/dL (ref 8.9–10.3)
Chloride: 100 mmol/L (ref 98–111)
Creatinine, Ser: 2.35 mg/dL — ABNORMAL HIGH (ref 0.44–1.00)
GFR calc Af Amer: 22 mL/min — ABNORMAL LOW (ref >60)
GFR calc non Af Amer: 19 mL/min — ABNORMAL LOW (ref >60)
Glucose, Bld: 94 mg/dL (ref 70–99)
Potassium: 3.7 mmol/L (ref 3.5–5.1)
Sodium: 143 mmol/L (ref 135–145)

## 2019-11-07 LAB — CBC
HCT: 26.1 % — ABNORMAL LOW (ref 36.0–46.0)
Hemoglobin: 7.6 g/dL — ABNORMAL LOW (ref 12.0–15.0)
MCH: 24.9 pg — ABNORMAL LOW (ref 26.0–34.0)
MCHC: 29.1 g/dL — ABNORMAL LOW (ref 30.0–36.0)
MCV: 85.6 fL (ref 80.0–100.0)
Platelets: 203 10*3/uL (ref 150–400)
RBC: 3.05 MIL/uL — ABNORMAL LOW (ref 3.87–5.11)
RDW: 23.9 % — ABNORMAL HIGH (ref 11.5–15.5)
WBC: 5 10*3/uL (ref 4.0–10.5)
nRBC: 0 % (ref 0.0–0.2)

## 2019-11-07 NOTE — Plan of Care (Signed)
°  Problem: Education: °Goal: Ability to demonstrate management of disease process will improve °Outcome: Progressing °  °Problem: Education: °Goal: Ability to verbalize understanding of medication therapies will improve °Outcome: Progressing °  °Problem: Activity: °Goal: Capacity to carry out activities will improve °Outcome: Progressing °  °

## 2019-11-07 NOTE — Progress Notes (Signed)
Advanced Heart Failure Rounding Note  PCP-Cardiologist: [name], MD   Subjective:    Husband at bedside. She is completely lucid and clear today. Remains short of breath, though swelling much improved. No chest pain. We discussed right heart cath, see below.  Objective:   Weight Range: 61.8 kg Body mass index is 23.38 kg/m.   Vital Signs:   Temp:  [98 F (36.7 C)-98.8 F (37.1 C)] 98.6 F (37 C) (08/14 1131) Pulse Rate:  [55-99] 55 (08/14 1131) Resp:  [16-18] 18 (08/14 1131) BP: (106-131)/(64-86) 113/64 (08/14 1131) SpO2:  [91 %-98 %] 91 % (08/14 1131) Weight:  [61.8 kg] 61.8 kg (08/14 0250) Last BM Date: 11/07/19  Weight change: Filed Weights   11/05/19 0311 11/06/19 0318 11/07/19 0250  Weight: 65.1 kg 64.9 kg 61.8 kg    Intake/Output:   Intake/Output Summary (Last 24 hours) at 11/07/2019 1209 Last data filed at 11/07/2019 0942 Gross per 24 hour  Intake 520 ml  Output 350 ml  Net 170 ml      Physical Exam    GEN: Elderly woman, in no acute distress NECK: JVD elevated to low neck at 60 degrees CARDIAC: irregularly irregular rhythm, normal S1 and S2, no rubs or gallops. No murmur appreciated. VASCULAR: Radial pulses 2+ bilaterally.  RESPIRATORY:  Clear with slight wheeze in upper fields, rhoncorous at bases ABDOMEN: Soft, non-tender, non-distended MUSCULOSKELETAL:  Moves all 4 limbs independently SKIN: Warm and dry, trivial bilateral LE edema NEUROLOGIC:  No focal neuro deficits noted. PSYCHIATRIC:  Normal affect    Telemetry   Atrial fibrillation, average 60s-70s  EKG    No new EKG since 11/05/19 to review   Labs    CBC Recent Labs    11/06/19 0433 11/07/19 0441  WBC 5.6 5.0  HGB 7.8* 7.6*  HCT 26.3* 26.1*  MCV 85.4 85.6  PLT 221 737   Basic Metabolic Panel Recent Labs    11/05/19 0448 11/05/19 0448 11/06/19 0433 11/07/19 0441  NA 141   < > 142 143  K 4.1   < > 4.1 3.7  CL 101   < > 102 100  CO2 28   < > 30 31  GLUCOSE  89   < > 98 94  BUN 24*   < > 21 20  CREATININE 2.55*   < > 2.40* 2.35*  CALCIUM 9.0   < > 9.1 9.3  MG 2.1  --  2.1  --    < > = values in this interval not displayed.   Liver Function Tests No results for input(s): AST, ALT, ALKPHOS, BILITOT, PROT, ALBUMIN in the last 72 hours. No results for input(s): LIPASE, AMYLASE in the last 72 hours. Cardiac Enzymes No results for input(s): CKTOTAL, CKMB, CKMBINDEX, TROPONINI in the last 72 hours.  BNP: BNP (last 3 results) Recent Labs    10/10/19 0907 10/29/19 1707 11/04/19 1700  BNP 1,768.0* 1,507.0* 1,511.3*    ProBNP (last 3 results) No results for input(s): PROBNP in the last 8760 hours.   D-Dimer No results for input(s): DDIMER in the last 72 hours. Hemoglobin A1C No results for input(s): HGBA1C in the last 72 hours. Fasting Lipid Panel No results for input(s): CHOL, HDL, LDLCALC, TRIG, CHOLHDL, LDLDIRECT in the last 72 hours. Thyroid Function Tests Recent Labs    11/04/19 1700  TSH 1.887    Other results:   Imaging    NM Pulmonary Perfusion  Result Date: 11/06/2019 CLINICAL DATA:  Right heart  failure EXAM: NUCLEAR MEDICINE PERFUSION LUNG SCAN TECHNIQUE: Perfusion images were obtained in multiple projections after intravenous injection of radiopharmaceutical. Ventilation scans intentionally deferred if perfusion scan and chest x-ray adequate for interpretation during COVID 19 epidemic. RADIOPHARMACEUTICALS:  4.1 mCi Tc-80m MAA IV COMPARISON:  Chest x-ray from earlier in the same day as well as chest CT from 09/05/2019 FINDINGS: Adequate uptake is noted throughout both lungs although large central defect is noted related to cardiac enlargement and pulmonary arterial enlargement. No focal perfusion defect to suggest acute or chronic pulmonary embolism is identified. IMPRESSION: No findings to suggest acute or chronic pulmonary embolus. Cardiomegaly with vascular prominence. Electronically Signed   By: Inez Catalina M.D.   On:  11/06/2019 15:32   DG CHEST PORT 1 VIEW  Result Date: 11/06/2019 CLINICAL DATA:  CHF and shortness of breath. EXAM: PORTABLE CHEST 1 VIEW COMPARISON:  11/03/2019 FINDINGS: Lungs are adequately inflated without focal airspace consolidation or effusion. Moderate stable cardiomegaly. Remainder of the exam is unchanged. IMPRESSION: 1. No acute cardiopulmonary disease. 2. Stable moderate cardiomegaly. Electronically Signed   By: Marin Olp M.D.   On: 11/06/2019 12:40     Medications:     Scheduled Medications: . bisoprolol  2.5 mg Oral Daily  . furosemide  80 mg Intravenous BID  . haloperidol lactate  1 mg Intravenous Once  . potassium chloride  20 mEq Oral BID  . QUEtiapine  25 mg Oral QHS  . sodium chloride flush  3 mL Intravenous Q12H    Infusions: . sodium chloride      PRN Medications: sodium chloride, acetaminophen, ondansetron (ZOFRAN) IV, sodium chloride flush   Assessment/Plan   1. Acute on chronic heart failure with mid range EF and RV failure:   -Echo this admission with EF 50% with septal HK, RV severely enlarged with mildly decreased systolic function and D-shaped septum.  Mild-moderate MR, severe TR with incomplete coaptation of tricuspid valve, IVC dilated. The pericardial effusion was moderate to large but primarily lateral and inferior, tamponade was not present.  -Cause of cardiomyopathy primarily involving the RV is not certain.  PA pressure is not significantly elevated by echo. VQ scan unremarkable for acute or chronic PE -Cr improving with diuresis - Continue 80 IV lasix BID today  - Continue bisoprolol 2.5 mg daily.  - No digoxin, Entresto, spironolactone for now with CKD stage IV.  - In absence of ACS, would not plan for coronary angiography given CKD stage IV.  -weight today 61.8 kg, admission weight 65.5 kg, does not seem to match I/O with only net negative 900 cc -I discussed right heart cath at length with them today. They are interested in getting  more information and potentially driving management options.  2. Atrial fibrillation: Chronic, has failed DCCV and Tikosyn. She has not tolerated amiodarone. Rate control good on bisoprolol 2.5 mg daily.  - Continue bisoprolol 2.5 daily.  - Hgb has been largely stable, 7.6 today. Denies active bleeding.  - holding apixaban given anemia. Continue to hold given potential right heart cath 11/09/19 -CHA2DS2/VAS Stroke Risk Points= 5  3. Anemia: Multifactorial, suspect due to renal disease as well as GI bleeding.  - To followup with GI for capsule endoscopy eventually.  - No blood products (Jehovah's Witness).   4. CKD: Stage IV. SCr trending down w/ diuresis, 2.55>>2.40 >> 2.35 today  - followed by outpatient nephrology   5. Pericardial effusion: Large on 6/21 echo without tamponade.  Repeat echo 8/11 showed moderate to large  primarily lateral and inferior effusion without tamponade.  - hemodynamically stable. TSH WNL   Length of Stay: [place], MD  11/07/2019, 12:09 PM  Advanced Heart Failure Team Pager [phone] (M-F; [place])  Please contact [facility] for night-coverage after hours (4p -7a ) and weekends on [email or web address]

## 2019-11-07 NOTE — Progress Notes (Signed)
Orthopedic Tech Progress Note Patient Details:  Bailey Bishop 1940/08/06 [id number]  Ortho Devices Type of Ortho Device: Haematologist Ortho Device/Splint Location: Bilateral Lower Extremity Ortho Device/Splint Interventions: Ordered, Adjustment, Application   Post Interventions Patient Tolerated: Well Instructions Provided: Adjustment of device, Care of device, Poper ambulation with device   Bailey Bishop P [name] 11/07/2019, 4:07 PM

## 2019-11-08 LAB — CBC
HCT: 28.7 % — ABNORMAL LOW (ref 36.0–46.0)
Hemoglobin: 8.6 g/dL — ABNORMAL LOW (ref 12.0–15.0)
MCH: 25.6 pg — ABNORMAL LOW (ref 26.0–34.0)
MCHC: 30 g/dL (ref 30.0–36.0)
MCV: 85.4 fL (ref 80.0–100.0)
Platelets: 193 10*3/uL (ref 150–400)
RBC: 3.36 MIL/uL — ABNORMAL LOW (ref 3.87–5.11)
RDW: 24.6 % — ABNORMAL HIGH (ref 11.5–15.5)
WBC: 5.1 10*3/uL (ref 4.0–10.5)
nRBC: 0 % (ref 0.0–0.2)

## 2019-11-08 LAB — BASIC METABOLIC PANEL
Anion gap: 12 (ref 5–15)
BUN: 17 mg/dL (ref 8–23)
CO2: 32 mmol/L (ref 22–32)
Calcium: 9.3 mg/dL (ref 8.9–10.3)
Chloride: 98 mmol/L (ref 98–111)
Creatinine, Ser: 2.21 mg/dL — ABNORMAL HIGH (ref 0.44–1.00)
GFR calc Af Amer: 23 mL/min — ABNORMAL LOW (ref >60)
GFR calc non Af Amer: 20 mL/min — ABNORMAL LOW (ref >60)
Glucose, Bld: 85 mg/dL (ref 70–99)
Potassium: 3.7 mmol/L (ref 3.5–5.1)
Sodium: 142 mmol/L (ref 135–145)

## 2019-11-08 MED ORDER — SODIUM CHLORIDE 0.9 % IV SOLN: INTRAVENOUS | Status: DC

## 2019-11-08 MED ORDER — SODIUM CHLORIDE 0.9% FLUSH: 3.0000 mL | Freq: Two times a day (BID) | INTRAVENOUS | Status: DC

## 2019-11-08 MED ORDER — SODIUM CHLORIDE 0.9% FLUSH: 3.0000 mL | INTRAVENOUS | Status: DC | PRN

## 2019-11-08 MED ORDER — SODIUM CHLORIDE 0.9 % IV SOLN: 250.0000 mL | INTRAVENOUS | Status: DC | PRN

## 2019-11-08 MED ORDER — ASPIRIN 81 MG PO CHEW
81.0000 mg | CHEWABLE_TABLET | ORAL | Status: AC
  Administered 2019-11-09: 81 mg via ORAL
  Filled 2019-11-08: qty 1

## 2019-11-08 NOTE — Plan of Care (Signed)
  Problem: Education: Goal: Knowledge of General Education information will improve Description Including pain rating scale, medication(s)/side effects and non-pharmacologic comfort measures Outcome: Progressing   

## 2019-11-08 NOTE — Progress Notes (Signed)
Advanced Heart Failure Rounding Note  PCP-Cardiologist: [name], MD   Subjective:    Feels that her breathing is slowly improving. We discussed RHC again, she is amenable as she would like more information and to see if there are additional treatment options.  Objective:   Weight Range: 59.1 kg Body mass index is 22.35 kg/m.   Vital Signs:   Temp:  [98.2 F (36.8 C)-98.6 F (37 C)] 98.4 F (36.9 C) (08/15 0745) Pulse Rate:  [55-93] 76 (08/15 0916) Resp:  [16-18] 18 (08/15 0745) BP: (98-117)/(59-86) 110/64 (08/15 0916) SpO2:  [91 %-97 %] 97 % (08/15 0745) Weight:  [59.1 kg] 59.1 kg (08/15 0010) Last BM Date: 11/07/19  Weight change: Filed Weights   11/06/19 0318 11/07/19 0250 11/08/19 0010  Weight: 64.9 kg 61.8 kg 59.1 kg    Intake/Output:   Intake/Output Summary (Last 24 hours) at 11/08/2019 0941 Last data filed at 11/08/2019 0900 Gross per 24 hour  Intake 600 ml  Output 1600 ml  Net -1000 ml      Physical Exam    GEN: Elderly in no acute distress NECK: JVD at clavicle at 60 degrees CARDIAC: irregularly irregular rhythm, normal S1 and S2, no rubs or gallops. 1/6 systolic murmur. VASCULAR: Radial pulses 2+ bilaterally.  RESPIRATORY:  Mildly rhoncorous at bases ABDOMEN: Soft, non-tender, non-distended MUSCULOSKELETAL:  Moves all 4 limbs independently SKIN: Warm and dry, trivial bilateral LE edema NEUROLOGIC:  No focal neuro deficits noted. PSYCHIATRIC:  Normal affect   Telemetry   Atrial fibrillation--personally reviewed  EKG    No new EKG since 11/05/19 to review   Labs    CBC Recent Labs    11/07/19 0441 11/08/19 0629  WBC 5.0 5.1  HGB 7.6* 8.6*  HCT 26.1* 28.7*  MCV 85.6 85.4  PLT 203 161   Basic Metabolic Panel Recent Labs    11/06/19 0433 11/06/19 0433 11/07/19 0441 11/08/19 0629  NA 142   < > 143 142  K 4.1   < > 3.7 3.7  CL 102   < > 100 98  CO2 30   < > 31 32  GLUCOSE 98   < > 94 85  BUN 21   < > 20 17    CREATININE 2.40*   < > 2.35* 2.21*  CALCIUM 9.1   < > 9.3 9.3  MG 2.1  --   --   --    < > = values in this interval not displayed.   Liver Function Tests No results for input(s): AST, ALT, ALKPHOS, BILITOT, PROT, ALBUMIN in the last 72 hours. No results for input(s): LIPASE, AMYLASE in the last 72 hours. Cardiac Enzymes No results for input(s): CKTOTAL, CKMB, CKMBINDEX, TROPONINI in the last 72 hours.  BNP: BNP (last 3 results) Recent Labs    10/10/19 0907 10/29/19 1707 11/04/19 1700  BNP 1,768.0* 1,507.0* 1,511.3*    ProBNP (last 3 results) No results for input(s): PROBNP in the last 8760 hours.   D-Dimer No results for input(s): DDIMER in the last 72 hours. Hemoglobin A1C No results for input(s): HGBA1C in the last 72 hours. Fasting Lipid Panel No results for input(s): CHOL, HDL, LDLCALC, TRIG, CHOLHDL, LDLDIRECT in the last 72 hours. Thyroid Function Tests No results for input(s): TSH, T4TOTAL, T3FREE, THYROIDAB in the last 72 hours.  Invalid input(s): FREET3  Other results:   Imaging    No results found.   Medications:     Scheduled Medications: . bisoprolol  2.5 mg Oral Daily  . furosemide  80 mg Intravenous BID  . haloperidol lactate  1 mg Intravenous Once  . potassium chloride  20 mEq Oral BID  . QUEtiapine  25 mg Oral QHS  . sodium chloride flush  3 mL Intravenous Q12H    Infusions: . sodium chloride      PRN Medications: sodium chloride, acetaminophen, ondansetron (ZOFRAN) IV, sodium chloride flush   Assessment/Plan   1. Acute on chronic heart failure with mid range EF and RV failure:   -Echo this admission with EF 50% with septal HK, RV severely enlarged with mildly decreased systolic function and D-shaped septum.  Mild-moderate MR, severe TR with incomplete coaptation of tricuspid valve, IVC dilated. The pericardial effusion was moderate to large but primarily lateral and inferior, tamponade was not present.  -Cause of cardiomyopathy  primarily involving the RV is not certain.  PA pressure is not significantly elevated by echo. VQ scan unremarkable for acute or chronic PE -Cr improving with diuresis, 2.21 today - Continue 80 IV lasix BID today, diuretics to be determined by RHC tomorrow - Continue bisoprolol 2.5 mg daily.  - No digoxin, Entresto, spironolactone for now with CKD stage IV.  - In absence of ACS, would not plan for coronary angiography given CKD stage IV.  -weight today 59.1 kg kg, admission weight 65.5 kg, does not seem to match I/O with only net negative 1600 cc -they are amenable to RHC tomorrow with [name]. She clinically appears to be nearing euvolemia. Will be helpful to know if there is pulmonary hypertension and if her pressures are improved post diuresis. Risks and benefits of cardiac catheterization have been discussed with the patient.  These include bleeding, infection, kidney damage, stroke, heart attack, death.  The patient understands these risks and is willing to proceed.  2. Atrial fibrillation: Chronic, has failed DCCV and Tikosyn. She has not tolerated amiodarone. Rate control good on bisoprolol 2.5 mg daily.  - Continue bisoprolol 2.5 daily.  - Hgb has been largely stable, 8.6 today, from 7.6 yesterday. Denies active bleeding.  - holding apixaban given anemia. Continue to hold given right heart cath 11/09/19 -CHA2DS2/VAS Stroke Risk Points= 5  3. Anemia: Multifactorial, suspect due to renal disease as well as GI bleeding.  - To followup with GI for capsule endoscopy eventually.  - No blood products (Jehovah's Witness).   4. CKD: Stage IV. SCr trending down w/ diuresis, 2.55>>2.40 >> 2.35 >> 2.21 today  - followed by outpatient nephrology  -K 3.7, stable  5. Pericardial effusion: Large on 6/21 echo without tamponade.  Repeat echo 8/11 showed moderate to large primarily lateral and inferior effusion without tamponade.  - hemodynamically stable. TSH WNL   Length of Stay:  [place], MD  11/08/2019, 9:41 AM  Advanced Heart Failure Team Pager [phone] (M-F; [place])  Please contact [facility] for night-coverage after hours (4p -7a ) and weekends on [email or web address]

## 2019-11-08 NOTE — Progress Notes (Signed)
Patient removed unna boots for the 2nd time. Will try to replace when patient is more alert.

## 2019-11-09 ENCOUNTER — Encounter (HOSPITAL_COMMUNITY)
Admission: EM | Disposition: A | Discharge status: Home / Self Care | Payer: Self-pay | Source: Home / Self Care | Attending: Cardiology

## 2019-11-09 ENCOUNTER — Other Ambulatory Visit (HOSPITAL_COMMUNITY): Payer: Medicare Other

## 2019-11-09 ENCOUNTER — Encounter (HOSPITAL_COMMUNITY): Payer: Medicare Other

## 2019-11-09 ENCOUNTER — Encounter (HOSPITAL_COMMUNITY): Payer: Self-pay | Admitting: Cardiology

## 2019-11-09 DIAGNOSIS — I50810 Right heart failure, unspecified: Secondary | ICD-10-CM

## 2019-11-09 HISTORY — PX: RIGHT HEART CATH: CATH118263

## 2019-11-09 LAB — POCT I-STAT EG7
Acid-Base Excess: 8 mmol/L — ABNORMAL HIGH (ref 0.0–2.0)
Acid-Base Excess: 7 mmol/L — ABNORMAL HIGH (ref 0.0–2.0)
Bicarbonate: 33.1 mmol/L — ABNORMAL HIGH (ref 20.0–28.0)
Bicarbonate: 32.3 mmol/L — ABNORMAL HIGH (ref 20.0–28.0)
Calcium, Ion: 1.05 mmol/L — ABNORMAL LOW (ref 1.15–1.40)
Calcium, Ion: 0.96 mmol/L — ABNORMAL LOW (ref 1.15–1.40)
HCT: 30 % — ABNORMAL LOW (ref 36.0–46.0)
HCT: 28 % — ABNORMAL LOW (ref 36.0–46.0)
Hemoglobin: 10.2 g/dL — ABNORMAL LOW (ref 12.0–15.0)
Hemoglobin: 9.5 g/dL — ABNORMAL LOW (ref 12.0–15.0)
O2 Saturation: 66 %
O2 Saturation: 66 %
Potassium: 4.1 mmol/L (ref 3.5–5.1)
Potassium: 3.8 mmol/L (ref 3.5–5.1)
Sodium: 145 mmol/L (ref 135–145)
Sodium: 148 mmol/L — ABNORMAL HIGH (ref 135–145)
TCO2: 35 mmol/L — ABNORMAL HIGH (ref 22–32)
TCO2: 34 mmol/L — ABNORMAL HIGH (ref 22–32)
pCO2, Ven: 48.5 mmHg (ref 44.0–60.0)
pCO2, Ven: 50.2 mmHg (ref 44.0–60.0)
pH, Ven: 7.441 — ABNORMAL HIGH (ref 7.250–7.430)
pH, Ven: 7.417 (ref 7.250–7.430)
pO2, Ven: 34 mmHg (ref 32.0–45.0)
pO2, Ven: 35 mmHg (ref 32.0–45.0)

## 2019-11-09 LAB — BASIC METABOLIC PANEL
Anion gap: 11 (ref 5–15)
BUN: 15 mg/dL (ref 8–23)
CO2: 32 mmol/L (ref 22–32)
Calcium: 8.8 mg/dL — ABNORMAL LOW (ref 8.9–10.3)
Chloride: 97 mmol/L — ABNORMAL LOW (ref 98–111)
Creatinine, Ser: 2.23 mg/dL — ABNORMAL HIGH (ref 0.44–1.00)
GFR calc Af Amer: 23 mL/min — ABNORMAL LOW (ref >60)
GFR calc non Af Amer: 20 mL/min — ABNORMAL LOW (ref >60)
Glucose, Bld: 87 mg/dL (ref 70–99)
Potassium: 3.7 mmol/L (ref 3.5–5.1)
Sodium: 140 mmol/L (ref 135–145)

## 2019-11-09 LAB — CBC
HCT: 27.5 % — ABNORMAL LOW (ref 36.0–46.0)
Hemoglobin: 8.3 g/dL — ABNORMAL LOW (ref 12.0–15.0)
MCH: 25.9 pg — ABNORMAL LOW (ref 26.0–34.0)
MCHC: 30.2 g/dL (ref 30.0–36.0)
MCV: 85.9 fL (ref 80.0–100.0)
Platelets: 180 10*3/uL (ref 150–400)
RBC: 3.2 MIL/uL — ABNORMAL LOW (ref 3.87–5.11)
RDW: 25.1 % — ABNORMAL HIGH (ref 11.5–15.5)
WBC: 5 10*3/uL (ref 4.0–10.5)
nRBC: 0 % (ref 0.0–0.2)

## 2019-11-09 SURGERY — RIGHT HEART CATH
Anesthesia: LOCAL

## 2019-11-09 MED ORDER — ACETAMINOPHEN 325 MG PO TABS: 650.0000 mg | ORAL_TABLET | ORAL | Status: DC | PRN

## 2019-11-09 MED ORDER — HYDRALAZINE HCL 20 MG/ML IJ SOLN: 10.0000 mg | INTRAMUSCULAR | Status: AC | PRN

## 2019-11-09 MED ORDER — LIDOCAINE HCL (PF) 1 % IJ SOLN
INTRAMUSCULAR | Status: DC | PRN
  Administered 2019-11-09: 2 mL

## 2019-11-09 MED ORDER — ALPRAZOLAM 0.5 MG PO TABS: 0.5000 mg | ORAL_TABLET | Freq: Every evening | ORAL | Status: DC | PRN

## 2019-11-09 MED ORDER — SODIUM CHLORIDE 0.9 % IV SOLN: 250.0000 mL | INTRAVENOUS | Status: DC | PRN

## 2019-11-09 MED ORDER — ONDANSETRON HCL 4 MG/2ML IJ SOLN: 4.0000 mg | Freq: Four times a day (QID) | INTRAMUSCULAR | Status: DC | PRN

## 2019-11-09 MED ORDER — SODIUM CHLORIDE 0.9% FLUSH: 3.0000 mL | INTRAVENOUS | Status: DC | PRN

## 2019-11-09 MED ORDER — LABETALOL HCL 5 MG/ML IV SOLN: 10.0000 mg | INTRAVENOUS | Status: AC | PRN

## 2019-11-09 MED ORDER — HEPARIN (PORCINE) IN NACL 1000-0.9 UT/500ML-% IV SOLN
INTRAVENOUS | Status: AC
  Filled 2019-11-09: qty 500

## 2019-11-09 MED ORDER — SODIUM CHLORIDE 0.9% FLUSH
3.0000 mL | Freq: Two times a day (BID) | INTRAVENOUS | Status: DC
  Administered 2019-11-10: 3 mL via INTRAVENOUS

## 2019-11-09 MED ORDER — HEPARIN (PORCINE) IN NACL 1000-0.9 UT/500ML-% IV SOLN
INTRAVENOUS | Status: DC | PRN
  Administered 2019-11-09: 500 mL

## 2019-11-09 MED ORDER — ALPRAZOLAM 0.5 MG PO TABS
0.5000 mg | ORAL_TABLET | Freq: Every evening | ORAL | Status: DC | PRN
  Administered 2019-11-10: 0.5 mg via ORAL
  Filled 2019-11-09: qty 1

## 2019-11-09 MED ORDER — LIDOCAINE HCL (PF) 1 % IJ SOLN
INTRAMUSCULAR | Status: AC
  Filled 2019-11-09: qty 30

## 2019-11-09 SURGICAL SUPPLY — 6 items
CATH BALLN WEDGE 5F 110CM (CATHETERS) ×1 IMPLANT
PACK CARDIAC CATHETERIZATION (CUSTOM PROCEDURE TRAY) ×2 IMPLANT
SHEATH GLIDE SLENDER 4/5FR (SHEATH) ×1 IMPLANT
TRANSDUCER W/STOPCOCK (MISCELLANEOUS) ×2 IMPLANT
TUBING ART PRESS 72  MALE/FEM (TUBING) ×2
TUBING ART PRESS 72 MALE/FEM (TUBING) IMPLANT

## 2019-11-09 NOTE — Progress Notes (Signed)
Orthopedic Tech Progress Note Patient Details:  Bailey Bishop 1940-06-27 [id number] Went to apply [name] to patient, but she has an appointment with the CATH LAB. Asked RN to let me know when patient returns so she could get her scheduled [name] on. [name], female   DOB: 12/14/1940, 79 y.o.   MRN: [id number]   Janit Pagan 11/09/2019, 2:26 PM

## 2019-11-09 NOTE — Progress Notes (Addendum)
Advanced Heart Failure Rounding Note  PCP-Cardiologist: [name], MD   Subjective:    Remains on IV Lasix 80 mg bid. -1.4 L in UOP charted yesterday. Wt down an additional 4 lb. Breathing improving w/ diuresis. SCr better, 2.2 today (2.6 on admit),  On for RHC today.   Hgb up to 8.3 today.   No complaints today. Sitting up in bed. No resting dyspnea. Husband present at bedside.    Objective:   Weight Range: 57.5 kg Body mass index is 21.75 kg/m.   Vital Signs:   Temp:  [98.4 F (36.9 C)-98.8 F (37.1 C)] 98.5 F (36.9 C) (08/16 0755) Pulse Rate:  [70-98] 75 (08/16 0755) Resp:  [15-18] 16 (08/16 0755) BP: (90-111)/(54-68) 102/55 (08/16 0755) SpO2:  [93 %-98 %] 93 % (08/16 0755) Weight:  [57.5 kg] 57.5 kg (08/16 0253) Last BM Date: 11/08/19  Weight change: Filed Weights   11/07/19 0250 11/08/19 0010 11/09/19 0253  Weight: 61.8 kg 59.1 kg 57.5 kg    Intake/Output:   Intake/Output Summary (Last 24 hours) at 11/09/2019 0859 Last data filed at 11/09/2019 1610 Gross per 24 hour  Intake 1200.54 ml  Output 1352 ml  Net -151.46 ml      Physical Exam    General:  Elderly female. No respiratory difficulty HEENT: normal Neck: supple.  JVD 10-12 cm. Carotids 2+ bilat; no bruits. No lymphadenopathy or thyromegaly appreciated. Cor: PMI nondisplaced. Irregularly iregular rhythm. No rubs, gallops or murmurs. Lungs: diffuse expiratory wheezing bilaterally, no crackles nor rhonchi  Abdomen: soft, nontender, nondistended. No hepatosplenomegaly. No bruits or masses. Good bowel sounds. Extremities: no cyanosis, clubbing, rash, edema Neuro: alert & oriented x 3, cranial nerves grossly intact. moves all 4 extremities w/o difficulty. Affect pleasant.   Telemetry   Chronic Afib 70s Personally reviewed   EKG    No new EKG to review   Labs    CBC Recent Labs    11/08/19 0629 11/09/19 0410  WBC 5.1 5.0  HGB 8.6* 8.3*  HCT 28.7* 27.5*  MCV 85.4 85.9  PLT 193  960   Basic Metabolic Panel Recent Labs    11/08/19 0629 11/09/19 0410  NA 142 140  K 3.7 3.7  CL 98 97*  CO2 32 32  GLUCOSE 85 87  BUN 17 15  CREATININE 2.21* 2.23*  CALCIUM 9.3 8.8*   Liver Function Tests No results for input(s): AST, ALT, ALKPHOS, BILITOT, PROT, ALBUMIN in the last 72 hours. No results for input(s): LIPASE, AMYLASE in the last 72 hours. Cardiac Enzymes No results for input(s): CKTOTAL, CKMB, CKMBINDEX, TROPONINI in the last 72 hours.  BNP: BNP (last 3 results) Recent Labs    10/10/19 0907 10/29/19 1707 11/04/19 1700  BNP 1,768.0* 1,507.0* 1,511.3*    ProBNP (last 3 results) No results for input(s): PROBNP in the last 8760 hours.   D-Dimer No results for input(s): DDIMER in the last 72 hours. Hemoglobin A1C No results for input(s): HGBA1C in the last 72 hours. Fasting Lipid Panel No results for input(s): CHOL, HDL, LDLCALC, TRIG, CHOLHDL, LDLDIRECT in the last 72 hours. Thyroid Function Tests No results for input(s): TSH, T4TOTAL, T3FREE, THYROIDAB in the last 72 hours.  Invalid input(s): FREET3  Other results:   Imaging    No results found.   Medications:     Scheduled Medications: . bisoprolol  2.5 mg Oral Daily  . furosemide  80 mg Intravenous BID  . haloperidol lactate  1 mg Intravenous Once  .  potassium chloride  20 mEq Oral BID  . QUEtiapine  25 mg Oral QHS  . sodium chloride flush  3 mL Intravenous Q12H  . sodium chloride flush  3 mL Intravenous Q12H    Infusions: . sodium chloride    . sodium chloride    . sodium chloride 10 mL/hr at 11/09/19 0523    PRN Medications: sodium chloride, sodium chloride, acetaminophen, ondansetron (ZOFRAN) IV, sodium chloride flush, sodium chloride flush   Assessment/Plan   1. Acute on chronic heart failure with mid range EF and RV failure:  Echo this admission with EF 50% with septal HK, RV severely enlarged with mildly decreased systolic function and D-shaped septum.   Mild-moderate MR, severe TR with incomplete coaptation of tricuspid valve, IVC dilated. The pericardial effusion was moderate to large but primarily lateral and inferior, tamponade was not present. Cause of cardiomyopathy primarily involving the RV is not certain.  PA pressure is not significantly elevated by echo. No chest pain. VQ scan negative for acute and chronic PEs. Good response to IV diuretics thus far. On for RHC today to assess etiology of RV failure and to check filling pressure to help guide further diuresis  - Continue 80 IV lasix BID. Further diuresis based on RHC today  - Continue bisoprolol 2.5 mg daily.  - No digoxin, Entresto, spironolactone for now with CKD stage IV.  - In absence of ACS, would not plan for coronary angiography given CKD stage IV.  2. Atrial fibrillation: Chronic, has failed DCCV and Tikosyn. She has not tolerated amiodarone. Rate control good on bisoprolol 2.5 mg daily.  - Continue bisoprolol 2.5 daily.  - Hgb has been stable, 8.3 today. No signs of active bleeding.  - keep of of a/c for now given chronic anemia and refusal to accept blood transfusion  3. Anemia: Multifactorial, suspect due to renal disease as well as GI bleeding. Hgb down to 6.5 recently in setting of GI bleeding with FOBT+. Hgb up to 8.3  today.  - To followup with GI for capsule endoscopy eventually.  - No blood products (Jehovah's Witness).  - Fe studies ok, Ferritin 311, Fe 74, Sats 20% 4. CKD: Stage IV. SCr trending down w/ diuresis, 2.55>>2.2 today  - followed by outpatient nephrology  5. Pericardial effusion: Large on 6/21 echo without tamponade.  Repeat echo 8/11 showed moderate to large primarily lateral and inferior effusion without tamponade.  - hemodynamically stable. TSH WNL  6. Delirium  - much improved today  - continue Seroquel at bedtime. - continue frequent reorientation. Husband at bedside   Length of Stay: [phone] Bailey Bishop  11/09/2019, 8:59  AM  Advanced Heart Failure Team Pager [phone] (M-F; [place])  Please contact [facility] for night-coverage after hours (4p -7a ) and weekends on [email or web address]  Patient seen and examined with the above-signed Advanced Practice Provider and/or Housestaff. I personally reviewed laboratory data, imaging studies and relevant notes. I independently examined the patient and formulated the important aspects of the plan. I have edited the note to reflect any of my changes or salient points. I have personally discussed the plan with the patient and/or family.  Has diuresed well. Breathing better. Creatinine improved. Hgb 8.3  General:  Elderly No resp difficulty HEENT: normal Neck: supple. JVP to jaw Carotids 2+ bilat; no bruits. No lymphadenopathy or thryomegaly appreciated. Cor: PMI nondisplaced. Irregular rate & rhythm. 2/6 TR Lungs: + wheeze Abdomen: soft, nontender, nondistended. No hepatosplenomegaly. No bruits or masses. Good  bowel sounds. Extremities: no cyanosis, clubbing, rash, tr-1+ edema Neuro: alert & orientedx3, cranial nerves grossly intact. moves all 4 extremities w/o difficulty. Affect pleasant  She has prominent RV failure. Unclear etiology. Will plan RHC today to further evaluate. Suspect she may have cor pulmonale in setting of previous tobacco use but amyloid also a possibility. Will need PFTs. Consider cMRI.   Glori Bickers, MD  2:30 PM

## 2019-11-09 NOTE — Consult Note (Signed)
   [name] Inpatient Consult   11/09/2019  Bailey Bishop 19-Feb-1941 [id number]   [place] [employer] [ACO] Patient:[employer] Medicare   Patient was assessed for [place] Management for community services. Patient is less than 7 days readmission with acute on chronic systolic HF exacerbation.   Patient is currently pending with [facility] Coordinator for follow up and was readmitted prior to engagement.  Plan: Patient could benefit from PT/OT evaluation when medically able.   Will follow for progress and disposition needs from inpatient and alert [facility] community of disposition and needs.  Of note, [facility] Care Management services does not replace or interfere with any services that are arranged by inpatient [facility] care management team.   For additional questions or referrals please contact:   Natividad Brood, RN BSN [facility] Liaison  [phone] business mobile phone Toll free office [phone]  Fax number: [phone] [email or web address]

## 2019-11-09 NOTE — H&P (View-Only) (Signed)
Advanced Heart Failure Rounding Note  PCP-Cardiologist: [name], MD   Subjective:    Remains on IV Lasix 80 mg bid. -1.4 L in UOP charted yesterday. Wt down an additional 4 lb. Breathing improving w/ diuresis. SCr better, 2.2 today (2.6 on admit),  On for RHC today.   Hgb up to 8.3 today.   No complaints today. Sitting up in bed. No resting dyspnea. Husband present at bedside.    Objective:   Weight Range: 57.5 kg Body mass index is 21.75 kg/m.   Vital Signs:   Temp:  [98.4 F (36.9 C)-98.8 F (37.1 C)] 98.5 F (36.9 C) (08/16 0755) Pulse Rate:  [70-98] 75 (08/16 0755) Resp:  [15-18] 16 (08/16 0755) BP: (90-111)/(54-68) 102/55 (08/16 0755) SpO2:  [93 %-98 %] 93 % (08/16 0755) Weight:  [57.5 kg] 57.5 kg (08/16 0253) Last BM Date: 11/08/19  Weight change: Filed Weights   11/07/19 0250 11/08/19 0010 11/09/19 0253  Weight: 61.8 kg 59.1 kg 57.5 kg    Intake/Output:   Intake/Output Summary (Last 24 hours) at 11/09/2019 0859 Last data filed at 11/09/2019 8921 Gross per 24 hour  Intake 1200.54 ml  Output 1352 ml  Net -151.46 ml      Physical Exam    General:  Elderly female. No respiratory difficulty HEENT: normal Neck: supple.  JVD 10-12 cm. Carotids 2+ bilat; no bruits. No lymphadenopathy or thyromegaly appreciated. Cor: PMI nondisplaced. Irregularly iregular rhythm. No rubs, gallops or murmurs. Lungs: diffuse expiratory wheezing bilaterally, no crackles nor rhonchi  Abdomen: soft, nontender, nondistended. No hepatosplenomegaly. No bruits or masses. Good bowel sounds. Extremities: no cyanosis, clubbing, rash, edema Neuro: alert & oriented x 3, cranial nerves grossly intact. moves all 4 extremities w/o difficulty. Affect pleasant.   Telemetry   Chronic Afib 70s Personally reviewed   EKG    No new EKG to review   Labs    CBC Recent Labs    11/08/19 0629 11/09/19 0410  WBC 5.1 5.0  HGB 8.6* 8.3*  HCT 28.7* 27.5*  MCV 85.4 85.9  PLT 193  194   Basic Metabolic Panel Recent Labs    11/08/19 0629 11/09/19 0410  NA 142 140  K 3.7 3.7  CL 98 97*  CO2 32 32  GLUCOSE 85 87  BUN 17 15  CREATININE 2.21* 2.23*  CALCIUM 9.3 8.8*   Liver Function Tests No results for input(s): AST, ALT, ALKPHOS, BILITOT, PROT, ALBUMIN in the last 72 hours. No results for input(s): LIPASE, AMYLASE in the last 72 hours. Cardiac Enzymes No results for input(s): CKTOTAL, CKMB, CKMBINDEX, TROPONINI in the last 72 hours.  BNP: BNP (last 3 results) Recent Labs    10/10/19 0907 10/29/19 1707 11/04/19 1700  BNP 1,768.0* 1,507.0* 1,511.3*    ProBNP (last 3 results) No results for input(s): PROBNP in the last 8760 hours.   D-Dimer No results for input(s): DDIMER in the last 72 hours. Hemoglobin A1C No results for input(s): HGBA1C in the last 72 hours. Fasting Lipid Panel No results for input(s): CHOL, HDL, LDLCALC, TRIG, CHOLHDL, LDLDIRECT in the last 72 hours. Thyroid Function Tests No results for input(s): TSH, T4TOTAL, T3FREE, THYROIDAB in the last 72 hours.  Invalid input(s): FREET3  Other results:   Imaging    No results found.   Medications:     Scheduled Medications: . bisoprolol  2.5 mg Oral Daily  . furosemide  80 mg Intravenous BID  . haloperidol lactate  1 mg Intravenous Once  .  potassium chloride  20 mEq Oral BID  . QUEtiapine  25 mg Oral QHS  . sodium chloride flush  3 mL Intravenous Q12H  . sodium chloride flush  3 mL Intravenous Q12H    Infusions: . sodium chloride    . sodium chloride    . sodium chloride 10 mL/hr at 11/09/19 0523    PRN Medications: sodium chloride, sodium chloride, acetaminophen, ondansetron (ZOFRAN) IV, sodium chloride flush, sodium chloride flush   Assessment/Plan   1. Acute on chronic heart failure with mid range EF and RV failure:  Echo this admission with EF 50% with septal HK, RV severely enlarged with mildly decreased systolic function and D-shaped septum.   Mild-moderate MR, severe TR with incomplete coaptation of tricuspid valve, IVC dilated. The pericardial effusion was moderate to large but primarily lateral and inferior, tamponade was not present. Cause of cardiomyopathy primarily involving the RV is not certain.  PA pressure is not significantly elevated by echo. No chest pain. VQ scan negative for acute and chronic PEs. Good response to IV diuretics thus far. On for RHC today to assess etiology of RV failure and to check filling pressure to help guide further diuresis  - Continue 80 IV lasix BID. Further diuresis based on RHC today  - Continue bisoprolol 2.5 mg daily.  - No digoxin, Entresto, spironolactone for now with CKD stage IV.  - In absence of ACS, would not plan for coronary angiography given CKD stage IV.  2. Atrial fibrillation: Chronic, has failed DCCV and Tikosyn. She has not tolerated amiodarone. Rate control good on bisoprolol 2.5 mg daily.  - Continue bisoprolol 2.5 daily.  - Hgb has been stable, 8.3 today. No signs of active bleeding.  - keep of of a/c for now given chronic anemia and refusal to accept blood transfusion  3. Anemia: Multifactorial, suspect due to renal disease as well as GI bleeding. Hgb down to 6.5 recently in setting of GI bleeding with FOBT+. Hgb up to 8.3  today.  - To followup with GI for capsule endoscopy eventually.  - No blood products ([employer]).  - Fe studies ok, Ferritin 311, Fe 74, Sats 20% 4. CKD: Stage IV. SCr trending down w/ diuresis, 2.55>>2.2 today  - followed by outpatient nephrology  5. Pericardial effusion: Large on 6/21 echo without tamponade.  Repeat echo 8/11 showed moderate to large primarily lateral and inferior effusion without tamponade.  - hemodynamically stable. TSH WNL  6. Delirium  - much improved today  - continue Seroquel at bedtime. - continue frequent reorientation. Husband at bedside   Length of Stay: [address] Ladoris Gene  11/09/2019, 8:59  AM  Advanced Heart Failure Team Pager [phone] (M-F; [place])  Please contact [facility] for night-coverage after hours (4p -7a ) and weekends on [email or web address]  Patient seen and examined with the above-signed Advanced Practice Provider and/or Housestaff. I personally reviewed laboratory data, imaging studies and relevant notes. I independently examined the patient and formulated the important aspects of the plan. I have edited the note to reflect any of my changes or salient points. I have personally discussed the plan with the patient and/or family.  Has diuresed well. Breathing better. Creatinine improved. Hgb 8.3  General:  Elderly No resp difficulty HEENT: normal Neck: supple. JVP to jaw Carotids 2+ bilat; no bruits. No lymphadenopathy or thryomegaly appreciated. Cor: PMI nondisplaced. Irregular rate & rhythm. 2/6 TR Lungs: + wheeze Abdomen: soft, nontender, nondistended. No hepatosplenomegaly. No bruits or masses. Good  bowel sounds. Extremities: no cyanosis, clubbing, rash, tr-1+ edema Neuro: alert & orientedx3, cranial nerves grossly intact. moves all 4 extremities w/o difficulty. Affect pleasant  She has prominent RV failure. Unclear etiology. Will plan RHC today to further evaluate. Suspect she may have cor pulmonale in setting of previous tobacco use but amyloid also a possibility. Will need PFTs. Consider cMRI.   Glori Bickers, MD  2:30 PM

## 2019-11-09 NOTE — Interval H&P Note (Signed)
History and Physical Interval Note:  11/09/2019 3:02 PM  Bailey Bishop  has presented today for surgery, with the diagnosis of RV failure.  The various methods of treatment have been discussed with the patient and family. After consideration of risks, benefits and other options for treatment, the patient has consented to  Procedure(s): RIGHT HEART CATH (N/A) as a surgical intervention.  The patient's history has been reviewed, patient examined, no change in status, stable for surgery.  I have reviewed the patient's chart and labs.  Questions were answered to the patient's satisfaction.     [name]

## 2019-11-09 NOTE — Progress Notes (Signed)
Patient has been taking own xanax throughout hospitalization. Medication is listed under PTA medications but has not been added to [facility] during course of stay. States she has been taking xanax for "21 years, since my breast cancer surgery". States that she has taken the last of her xanax tonight already when informed of need to take home medications to pharmacy. Notified MD. No new orders as of this time.

## 2019-11-10 ENCOUNTER — Encounter (HOSPITAL_COMMUNITY): Payer: Self-pay | Admitting: Internal Medicine

## 2019-11-10 ENCOUNTER — Ambulatory Visit (HOSPITAL_COMMUNITY): Payer: Medicare Other | Admitting: Hematology

## 2019-11-10 LAB — CBC
HCT: 29.4 % — ABNORMAL LOW (ref 36.0–46.0)
Hemoglobin: 8.5 g/dL — ABNORMAL LOW (ref 12.0–15.0)
MCH: 25.1 pg — ABNORMAL LOW (ref 26.0–34.0)
MCHC: 28.9 g/dL — ABNORMAL LOW (ref 30.0–36.0)
MCV: 87 fL (ref 80.0–100.0)
Platelets: 158 10*3/uL (ref 150–400)
RBC: 3.38 MIL/uL — ABNORMAL LOW (ref 3.87–5.11)
RDW: 25.3 % — ABNORMAL HIGH (ref 11.5–15.5)
WBC: 5.9 10*3/uL (ref 4.0–10.5)
nRBC: 0 % (ref 0.0–0.2)

## 2019-11-10 LAB — POCT I-STAT EG7
Acid-Base Excess: 9 mmol/L — ABNORMAL HIGH (ref 0.0–2.0)
Bicarbonate: 34.6 mmol/L — ABNORMAL HIGH (ref 20.0–28.0)
Calcium, Ion: 1.13 mmol/L — ABNORMAL LOW (ref 1.15–1.40)
HCT: 31 % — ABNORMAL LOW (ref 36.0–46.0)
Hemoglobin: 10.5 g/dL — ABNORMAL LOW (ref 12.0–15.0)
O2 Saturation: 67 %
Potassium: 4.3 mmol/L (ref 3.5–5.1)
Sodium: 144 mmol/L (ref 135–145)
TCO2: 36 mmol/L — ABNORMAL HIGH (ref 22–32)
pCO2, Ven: 50.4 mmHg (ref 44.0–60.0)
pH, Ven: 7.445 — ABNORMAL HIGH (ref 7.250–7.430)
pO2, Ven: 34 mmHg (ref 32.0–45.0)

## 2019-11-10 LAB — BASIC METABOLIC PANEL
Anion gap: 9 (ref 5–15)
BUN: 18 mg/dL (ref 8–23)
CO2: 32 mmol/L (ref 22–32)
Calcium: 8.8 mg/dL — ABNORMAL LOW (ref 8.9–10.3)
Chloride: 98 mmol/L (ref 98–111)
Creatinine, Ser: 2.15 mg/dL — ABNORMAL HIGH (ref 0.44–1.00)
GFR calc Af Amer: 24 mL/min — ABNORMAL LOW (ref >60)
GFR calc non Af Amer: 21 mL/min — ABNORMAL LOW (ref >60)
Glucose, Bld: 117 mg/dL — ABNORMAL HIGH (ref 70–99)
Potassium: 4 mmol/L (ref 3.5–5.1)
Sodium: 139 mmol/L (ref 135–145)

## 2019-11-10 NOTE — Discharge Summary (Addendum)
Advanced Heart Failure Team  Discharge Summary   [name] MRN: [id number], DOB/AGE: February 28, 1941 79 y.o. 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 Was initially referred to [facility] by Levell July, NP for evaluation of CHF. Patient has a complex past history. She has a long history of atrial fibrillation, now chronic as she has failed DCCV and Tikosyn and did not tolerate amiodarone. Her last echo in 6/21 showed EF 35-40% with mild RV dysfunction, large pericardial effusion without tamponade, moderate MR, moderate TR. Cause of cardiomyopathy is not certain.   She has been admitted multiple times this year to [facility]. She feels like hospitalizations have not helped her shortness of breath. Most recently, she was admitted to [facility] 10/29/19. She was found to be anemic with hgb 6.4, FOBT+. 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 Hgb was 7.5 at time of discharge. Eliquis was helded for several days, she was told to restart it on 8/11.   She had f/u in [facility] on 8/9, the same day she was discharged from [facility]. Was still SOB waking short distances and orthopneic. Denied CP. Was volume overloaded on exam. She was instructed to increase her home torsemide dose to 60 qam/40 qpm x 2 days then 40 mg bid (previous home regimen torsemide 20 mg bid). She was also scheduled for repeat echo to reassess her pericardial effusion.   Pt returned home and unfortunately did not increase her torsemide as instructed. Developed worsening dyspnea and came to the [facility] on 8/11 for  further evaluation. Re-admitetd for a/c systolic heart failure and started on IV Lasix. Hgb 7.4 on admit. STAT echo repeated to reassess her pericardial effusion. Study showed EF 50% with septal HK, RV severely enlarged with mildly decreased systolic function and D-shaped septum.  Mild-moderate MR, severe TR with incomplete coaptation of tricuspid valve, IVC dilated. The pericardial effusion was moderate to large but primarily lateral and inferior, tamponade was not present.   She responded well to IV diuretics. RHC c/w advanced RV failure w/ PAPi <1.0 however normal CO. Normal left sided filling pressures. Unsure if this is primary RV issue vs burned out PAH.  V/Q scan negative for acute and chronic PEs. PFTs ordered (pending). There was concern for possible cardiac amyloid, however pt declined cMRI. Once adequately diuresed, she was placed back on PO diuretics. CBC was followed closely and hgb trended up. It was decided to permamently discontinue Eliquis given her chronic anemia, bleed risk and refusal to accept blood products.   On 11/11/19, she was seen and examined by Dr. Phillip Heal and felt stable for d/c home.  [facility] post hospital f/u has been arranged.   See below for detailed problem list.   1. Acute on chronicheart failure with mid range EF and RV failure:Echo this admission with EF 50% with septal HK, RV severely enlarged with mildly decreased systolic function and D-shaped septum. Mild-moderate MR, severe TR with incomplete coaptation of tricuspid valve, IVC dilated. The pericardial effusion was moderate to large but primarily lateral and inferior, tamponade was not present.Cause of cardiomyopathyprimarily involving the RVis not  certain.PA pressure is not significantly elevated by echo.No chest pain. VQ scan negative for acute and chronic PEs. RHC this admit c/w advanced RV failure w/ PAPi <1.0 however normal CO and normal left sided filling pressures.  In absence of ACS and CKD stage IV,  LHC was deferred.  -Diuresed with IV lasix. Plan to transition to torsemide 60 mg twice a day starting 11/12/19/  - ?Amyloid as potential etiology, recommend cMRI but she declines (claustophobia)  - Continue bisoprolol 2.5 mg daily.  - No digoxin, Entresto, spironolactone for now with CKD stage IV.  - order PFTs  2. Atrial fibrillation: Chronic, has failed DCCV and Tikosyn. She has not tolerated amiodarone. Rate control good on bisoprolol 2.5 mg daily.  - Continue bisoprolol 2.5 daily.  - Hgb trending up, 8.5 . No signs of active bleeding.  - keep of of a/c for now given chronic anemia and refusal to accept blood transfusion  3. Anemia: Multifactorial, suspect due to renal disease as well as GI bleeding. Hgb down to 6.5 recently in setting of GI bleeding with FOBT+. Hgb 8.5.    - To followup with GI for capsule endoscopyeventually.  - No blood products ([employer]).  -Fe studies ok, Ferritin 311, Fe 74, Sats 20% 4. CKD: Stage IV. Renal function followed closely and remained stable.  - followed by outpatient nephrology  5. Pericardial effusion: Large on 6/21 echo without tamponade.Repeat echo 8/11 showed moderate to large primarily lateral and inferior effusion without tamponade. - hemodynamically stable. TSH WNL  6. Delirium  - Resolved.  - continue Seroquel at bedtime.  Discharge Vitals: Blood pressure (!) 98/56, pulse 79, temperature 98.9 F (37.2 C), temperature source Oral, resp. rate 18, height 5\' 4"  (1.626 m), weight 56.6 kg, SpO2 96 %.  Labs: Lab Results  Component Value Date   WBC 5.9 11/10/2019   HGB 8.5 (L) 11/10/2019   HCT 29.4 (L) 11/10/2019   MCV 87.0 11/10/2019   PLT 158 11/10/2019    Recent Labs  Lab 11/11/19 0919  NA 139  K 3.9  CL 99  CO2 33*  BUN 16  CREATININE 2.11*  CALCIUM 8.9  GLUCOSE 98   Lab Results  Component Value Date   CHOL 109 09/24/2016   HDL 34 (L) 09/24/2016   LDLCALC 62 09/24/2016   TRIG 40 10/29/2019   BNP (last  3 results) Recent Labs    10/10/19 0907 10/29/19 1707 11/04/19 1700  BNP 1,768.0* 1,507.0* 1,511.3*    ProBNP (last 3 results) No results for input(s): PROBNP in the last 8760 hours.   Diagnostic Studies/Procedures   CARDIAC CATHETERIZATION  Result Date: 11/09/2019 Findings: RA = 18 RV = 34/14 PA = 35/18 (25) PCW = 16 Fick cardiac output/index = 5.8/3.6 PVR = 1.5 WU FA sat = 99% PA sat = 66%, 67% High SVC = 66% PAPi = 0.9 Assessment: 1. Predominant RV failure 2. Mild PAH 3. Normal left-sided pressures 4. Normal cardiac output Plan/Discussion: Hemodynamics suggest advanced RV failure with PAPi < 1.0 however CO is normal. Etiology of RV failure unclear. Primary RV issue versus burned out PAH. If it was the laltter would expect CO to be down. Consider cMRI if she can tolerate. Diurese slowly. Glori Bickers, MD 3:40 PM   Discharge Medications   Allergies as of 11/11/2019      Reactions   Cardizem Cd [diltiazem Hcl Er Beads] Diarrhea   Amiodarone Nausea And Vomiting   Metoprolol Tartrate Diarrhea   Sulfa Antibiotics Nausea And  Vomiting   Tomato Other (See Comments)   Burns stomach   Carvedilol Diarrhea, Rash   Chocolate Rash, Other (See Comments)   Dark chocolate      Medication List    STOP taking these medications   apixaban 2.5 MG Tabs tablet Commonly known as: ELIQUIS     TAKE these medications   acetaminophen 650 MG CR tablet Commonly known as: TYLENOL Take 650 mg by mouth every 8 (eight) hours as needed for pain.   albuterol 108 (90 Base) MCG/ACT inhaler Commonly known as: VENTOLIN HFA Inhale 1-2 puffs into the lungs every 6 (six) hours as needed for wheezing or shortness of breath.   ALPRAZolam 0.5 MG tablet Commonly known as: XANAX Take 0.5 mg by mouth at bedtime as needed for anxiety.   bisoprolol 5 MG tablet Commonly known as: ZEBETA Take 0.5 tablets (2.5 mg total) by mouth daily.   calcium citrate-vitamin D 315-200 MG-UNIT tablet Commonly known as:  CITRACAL+D Take 1 tablet by mouth 2 (two) times daily.   cetirizine HCl 1 MG/ML solution Commonly known as: ZYRTEC Take 5 mLs (5 mg total) by mouth daily.   fluticasone 50 MCG/ACT nasal spray Commonly known as: FLONASE Place 2 sprays into both nostrils daily.   loperamide 2 MG capsule Commonly known as: IMODIUM Take 1 capsule (2 mg total) by mouth every 8 (eight) hours as needed for diarrhea or loose stools.   omeprazole 40 MG capsule Commonly known as: PRILOSEC Take 40 mg by mouth as needed.   potassium chloride SA 20 MEQ tablet Commonly known as: KLOR-CON Take 1 tablet (20 mEq total) by mouth 2 (two) times daily. Start on 09/28/2019 when you restart Lasix   torsemide 20 MG tablet Commonly known as: Demadex Take 3 tablets (60 mg total) by mouth 2 (two) times daily. What changed: how much to take   traZODone 50 MG tablet Commonly known as: DESYREL Take 1 tablet (50 mg total) by mouth at bedtime as needed for sleep.       Disposition   The patient will be discharged in stable condition to home. Discharge Instructions    (HEART FAILURE PATIENTS) Call MD:  Anytime you have any of the following symptoms: 1) 3 pound weight gain in 24 hours or 5 pounds in 1 week 2) shortness of breath, with or without a dry hacking cough 3) swelling in the hands, feet or stomach 4) if you have to sleep on extra pillows at night in order to breathe.   Complete by: As directed    Diet - low sodium heart healthy   Complete by: As directed    Increase activity slowly   Complete by: As directed       Follow-up Information    [facility] Follow up on 11/17/2019.   Specialty: Cardiology Why: 2:30 PM  Advanced Heart Failure [place]  Contact information: [address] [id number] [place] [phone]                Duration of Discharge Encounter: Greater than 35 minutes    Signed, Darrick Grinder  11/11/2019, 11:03 AM   Agree with above. [place] for d/c today. F/u in [facility] Clinic.  Glori Bickers, MD  9:14 PM

## 2019-11-10 NOTE — Progress Notes (Signed)
MD notified of hypotension. Patient denies symptoms besides being "just sleepy". No obvious signs of bleeding at cath site. No new orders at this time.

## 2019-11-10 NOTE — Care Management Important Message (Signed)
Important Message  Patient Details  Name: Bailey Bishop MRN: [id number] Date of Birth: 1940-06-01   Medicare Important Message Given:  Yes     Shelda Altes 11/10/2019, 1:45 PM

## 2019-11-10 NOTE — Progress Notes (Addendum)
Advanced Heart Failure Rounding Note  PCP-Cardiologist: [name], MD   Subjective:    RHC done yesterday c/w advanced RV failure w/ PAPi <1.0 however normal CO. Normal left sided filling pressures.   Appears she only got one does of IV Lasix yesterday, only 875 cc in UOP charted. Wt unchanged at 126 lb. Just received 80 IV lasix this am. BMP pending.   She feels ok at rest. Laying in bed watching TV. We discussed cMRI but she feels that she would have a hard time participating due to claustrophobia.    RHC Findings:  RA = 18 RV = 34/14 PA = 35/18 (25) PCW = 16 Fick cardiac output/index = 5.8/3.6 PVR = 1.5 WU FA sat = 99% PA sat = 66%, 67% High SVC = 66% PAPi = 0.9  Assessment: 1. Predominant RV failure 2. Mild PAH 3. Normal left-sided pressures 4. Normal cardiac output  Plan/Discussion:  Hemodynamics suggest advanced RV failure with PAPi < 1.0 however CO is normal. Etiology of RV failure unclear. Primary RV issue versus burned out PAH. If it was the laltter would expect CO to be down. Consider cMRI if she can tolerate. Diurese slowly.     Objective:   Weight Range: 57.3 kg Body mass index is 21.7 kg/m.   Vital Signs:   Temp:  [98.3 F (36.8 C)-98.7 F (37.1 C)] 98.3 F (36.8 C) (08/17 0807) Pulse Rate:  [61-134] 81 (08/17 0807) Resp:  [12-21] 17 (08/17 0726) BP: (84-127)/(44-82) 94/76 (08/17 0807) SpO2:  [92 %-100 %] 96 % (08/17 0807) Weight:  [57.3 kg] 57.3 kg (08/17 0407) Last BM Date: 11/08/19  Weight change: Filed Weights   11/08/19 0010 11/09/19 0253 11/10/19 0407  Weight: 59.1 kg 57.5 kg 57.3 kg    Intake/Output:   Intake/Output Summary (Last 24 hours) at 11/10/2019 0820 Last data filed at 11/10/2019 0749 Gross per 24 hour  Intake 643 ml  Output 875 ml  Net -232 ml      Physical Exam    General:  Elderly female, resting in bed. No respiratory difficulty HEENT: normal Neck: supple.  JVD elevated to jaw-line. Carotids 2+  bilat; no bruits. No lymphadenopathy or thyromegaly appreciated. Cor: PMI nondisplaced. Irregularly iregular rhythm, regular rate. No rubs, gallops or murmurs. Lungs: clear, faint bilateral expiratory wheezing at the bases bilaterally  Abdomen: soft, nontender, nondistended. No hepatosplenomegaly. No bruits or masses. Good bowel sounds. Extremities: no cyanosis, clubbing, rash, edema Neuro: alert & oriented x 3, cranial nerves grossly intact. moves all 4 extremities w/o difficulty. Affect pleasant.   Telemetry   Chronic Afib 60s- 70s Personally reviewed   EKG    No new EKG to review   Labs    CBC Recent Labs    11/08/19 0629 11/08/19 0629 11/09/19 0410 11/09/19 0410 11/09/19 1525 11/09/19 1530  WBC 5.1  --  5.0  --   --   --   HGB 8.6*   < > 8.3*   < > 10.2* 9.5*  HCT 28.7*   < > 27.5*   < > 30.0* 28.0*  MCV 85.4  --  85.9  --   --   --   PLT 193  --  180  --   --   --    < > = values in this interval not displayed.   Basic Metabolic Panel Recent Labs    11/08/19 0629 11/08/19 0629 11/09/19 0410 11/09/19 0410 11/09/19 1525 11/09/19 1530  NA 142   < >  140   < > 145 148*  K 3.7   < > 3.7   < > 4.1 3.8  CL 98  --  97*  --   --   --   CO2 32  --  32  --   --   --   GLUCOSE 85  --  87  --   --   --   BUN 17  --  15  --   --   --   CREATININE 2.21*  --  2.23*  --   --   --   CALCIUM 9.3  --  8.8*  --   --   --    < > = values in this interval not displayed.   Liver Function Tests No results for input(s): AST, ALT, ALKPHOS, BILITOT, PROT, ALBUMIN in the last 72 hours. No results for input(s): LIPASE, AMYLASE in the last 72 hours. Cardiac Enzymes No results for input(s): CKTOTAL, CKMB, CKMBINDEX, TROPONINI in the last 72 hours.  BNP: BNP (last 3 results) Recent Labs    10/10/19 0907 10/29/19 1707 11/04/19 1700  BNP 1,768.0* 1,507.0* 1,511.3*    ProBNP (last 3 results) No results for input(s): PROBNP in the last 8760 hours.   D-Dimer No results for  input(s): DDIMER in the last 72 hours. Hemoglobin A1C No results for input(s): HGBA1C in the last 72 hours. Fasting Lipid Panel No results for input(s): CHOL, HDL, LDLCALC, TRIG, CHOLHDL, LDLDIRECT in the last 72 hours. Thyroid Function Tests No results for input(s): TSH, T4TOTAL, T3FREE, THYROIDAB in the last 72 hours.  Invalid input(s): FREET3  Other results:   Imaging    CARDIAC CATHETERIZATION  Result Date: 11/09/2019 Findings: RA = 18 RV = 34/14 PA = 35/18 (25) PCW = 16 Fick cardiac output/index = 5.8/3.6 PVR = 1.5 WU FA sat = 99% PA sat = 66%, 67% High SVC = 66% PAPi = 0.9 Assessment: 1. Predominant RV failure 2. Mild PAH 3. Normal left-sided pressures 4. Normal cardiac output Plan/Discussion: Hemodynamics suggest advanced RV failure with PAPi < 1.0 however CO is normal. Etiology of RV failure unclear. Primary RV issue versus burned out PAH. If it was the laltter would expect CO to be down. Consider cMRI if she can tolerate. Diurese slowly. Glori Bickers, MD 3:40 PM    Medications:     Scheduled Medications: . bisoprolol  2.5 mg Oral Daily  . furosemide  80 mg Intravenous BID  . haloperidol lactate  1 mg Intravenous Once  . potassium chloride  20 mEq Oral BID  . QUEtiapine  25 mg Oral QHS  . sodium chloride flush  3 mL Intravenous Q12H  . sodium chloride flush  3 mL Intravenous Q12H    Infusions: . sodium chloride    . sodium chloride      PRN Medications: sodium chloride, sodium chloride, acetaminophen, ALPRAZolam, ondansetron (ZOFRAN) IV, sodium chloride flush, sodium chloride flush   Assessment/Plan   1. Acute on chronic heart failure with mid range EF and RV failure:  Echo this admission with EF 50% with septal HK, RV severely enlarged with mildly decreased systolic function and D-shaped septum.  Mild-moderate MR, severe TR with incomplete coaptation of tricuspid valve, IVC dilated. The pericardial effusion was moderate to large but primarily lateral and  inferior, tamponade was not present. Cause of cardiomyopathy primarily involving the RV is not certain.  PA pressure is not significantly elevated by echo. No chest pain. VQ scan negative for acute and  chronic PEs. RHC this admit c/w advanced RV failure w/ PAPi <1.0 however normal CO and normal left sided filling pressures.  In absence of ACS and CKD stage IV, LHC was deferred.  - continue to diureses slowly, continue 80 IV lasix BID. - ?Amyloid as potential etiology, recommend cMRI but she declines (claustophobia)  - Continue bisoprolol 2.5 mg daily.  - No digoxin, Entresto, spironolactone for now with CKD stage IV.  - order PFTs  2. Atrial fibrillation: Chronic, has failed DCCV and Tikosyn. She has not tolerated amiodarone. Rate control good on bisoprolol 2.5 mg daily.  - Continue bisoprolol 2.5 daily.  - Hgb trending up, 9.5. No signs of active bleeding.  - keep of of a/c for now given chronic anemia and refusal to accept blood transfusion  3. Anemia: Multifactorial, suspect due to renal disease as well as GI bleeding. Hgb down to 6.5 recently in setting of GI bleeding with FOBT+. Hgb up to 9.5   - To followup with GI for capsule endoscopy eventually.  - No blood products ([employer]ovah[employer]).  - Fe studies ok, Ferritin 311, Fe 74, Sats 20% 4. CKD: Stage IV. monitor w/ diuresis, BMP pending  - followed by outpatient nephrology  5. Pericardial effusion: Large on 6/21 echo without tamponade.  Repeat echo 8/11 showed moderate to large primarily lateral and inferior effusion without tamponade.  - hemodynamically stable. TSH WNL  6. Delirium  - much improved - - continue Seroquel at bedtime. - continue frequent reorientation.   Length of Stay: [address], PA-C  11/10/2019, 8:20 AM  Advanced Heart Failure Team Pager [phone] (M-F; 7a - 4p)  Please contact [facility] for night-coverage after hours (4p -7a ) and weekends on [email or web address]  Patient seen and examined with the  above-signed Advanced Practice Provider and/or Housestaff. I personally reviewed laboratory data, imaging studies and relevant notes. I independently examined the patient and formulated the important aspects of the plan. I have edited the note to reflect any of my changes or salient points. I have personally discussed the plan with the patient and/or family.  RHC results reviewed with her. She feels better today. No SOB, orthopnea or PND. Refuses cMRI due to claustrophobia  General:  Elderly . No resp difficulty HEENT: normal Neck: supple.JVP to jaw  Carotids 2+ bilat; no bruits. No lymphadenopathy or thryomegaly appreciated. Cor: PMI nondisplaced. Regular rate & rhythm. No rubs, gallops or murmurs. Lungs: clear decreased throughout  Abdomen: soft, nontender, nondistended. No hepatosplenomegaly. No bruits or masses. Good bowel sounds. Extremities: no cyanosis, clubbing, rash, edema Neuro: alert & orientedx3, cranial nerves grossly intact. moves all 4 extremities w/o difficulty. Affect pleasant  Etiology of RV failure remains unclear.. Unable to get cMRI due to claustrophobia. Will diurese one more day. Possible d/c home tomorrow. Hgb remains stable.   Glori Bickers, MD  4:39 PM

## 2019-11-10 NOTE — TOC Initial Note (Signed)
Transition of Care [name]) - Initial/Assessment Note    Patient Details  Name: Bailey Bishop MRN: [id number] Date of Birth: 02-05-1941  Transition of Care [facility]) CM/SW Contact:    Zenon Mayo, RN Phone Number: 11/10/2019, 9:40 AM  Clinical Narrative:                 NCM spoke with patient and her spouse at bedside, she was up walking around pretty well. She states she does not need any HH services. She states she does not need any DME and she does not have any DME at home.   She conts on iv lasix at this time.  TOC will continue to follow .  Expected Discharge Plan: [place] Barriers to Discharge: Continued Medical Work up   Patient Goals and CMS Choice Patient states their goals for this hospitalization and ongoing recovery are:: to get better      Expected Discharge Plan and Services Expected Discharge Plan: [place]   Discharge Planning Services: CM Consult   Living arrangements for the past 2 months: Single Family Home                   DME Agency: NA       HH Arranged: NA          Prior Living Arrangements/Services Living arrangements for the past 2 months: Single Family Home Lives with:: Spouse Patient language and need for interpreter reviewed:: Yes Do you feel safe going back to the place where you live?: Yes      Need for Family Participation in Patient Care: Yes (Comment) Care giver support system in place?: Yes (comment)   Criminal Activity/Legal Involvement Pertinent to Current Situation/Hospitalization: No - Comment as needed  Activities of Daily Living Home Assistive Devices/Equipment: None ADL Screening (condition at time of admission) Patient's cognitive ability adequate to safely complete daily activities?: Yes Is the patient deaf or have difficulty hearing?: No Does the patient have difficulty seeing, even when wearing glasses/contacts?: No Does the patient have difficulty concentrating,  remembering, or making decisions?: No Patient able to express need for assistance with ADLs?: Yes Does the patient have difficulty dressing or bathing?: No Independently performs ADLs?: Yes (appropriate for developmental age) Does the patient have difficulty walking or climbing stairs?: No Weakness of Legs: None Weakness of Arms/Hands: None  Permission Sought/Granted                  Emotional Assessment Appearance:: Appears stated age Attitude/Demeanor/Rapport: Engaged Affect (typically observed): Appropriate Orientation: : Oriented to Self, Oriented to Place, Oriented to  Time, Oriented to Situation Alcohol / Substance Use: Not Applicable Psych Involvement: No (comment)  Admission diagnosis:  Pericardial effusion [I31.3] Acute on chronic systolic heart failure, NYHA class 3 (HCC) [I50.23] Acute on chronic congestive heart failure, unspecified heart failure type ([place]) [I50.9] Patient Active Problem List   Diagnosis Date Noted  . Acute on chronic systolic heart failure, NYHA class 3 ([place]) 11/04/2019  . Fever 10/29/2019  . Acute blood loss anemia 10/29/2019  . Acute on chronic diastolic HF (heart failure) ([place]) 09/18/2019  . GI bleed 09/05/2019  . Symptomatic anemia 09/03/2019  . CKD (chronic kidney disease), stage IIIb 09/03/2019  . Iron deficiency anemia   . Benign neoplasm of transverse colon   . Diverticulosis of large intestine without diverticulitis   . Visit for monitoring Tikosyn therapy 12/02/2017  . Atrial fibrillation ([place]) 03/31/2017  . Persistent atrial  fibrillation ([place])   . Anticoagulated 10/03/2016  . COPD (chronic obstructive pulmonary disease) ([place]) 10/03/2016  . Hypokalemia 09/24/2016  . Asthma in adult without complication 49/82/6415  . Hyperglycemia 09/23/2016  . Anxiety   . GERD (gastroesophageal reflux disease)   . Shortness of breath   . Essential hypertension   . Status asthmaticus 07/07/2014   PCP:  [name], MD Pharmacy:   [facility], [address] [address] Phone: [phone] Fax: [phone]  [facility], [place] [address] [place] [address] Phone: [phone] Fax: [phone]  [id number] - [place], [address] [place] [address] Phone: [phone] Fax: [phone]     Social Determinants of Health (SDOH) Interventions Food Insecurity Interventions: Intervention Not Indicated Social Connections Interventions: Intervention Not Indicated  Readmission Risk Interventions Readmission Risk Prevention Plan 11/10/2019 10/30/2019  Transportation Screening Complete Complete  Medication Review Press photographer) Complete Complete  PCP or Specialist appointment within 3-5 days of discharge - Complete  HRI or Home Care Consult Complete Patient refused  SW Recovery Care/Counseling Consult Complete Complete  Palliative Care Screening Not Applicable Not [place] Not Applicable Not Applicable  Some recent data might be hidden

## 2019-11-11 LAB — BASIC METABOLIC PANEL
Anion gap: 7 (ref 5–15)
BUN: 16 mg/dL (ref 8–23)
CO2: 33 mmol/L — ABNORMAL HIGH (ref 22–32)
Calcium: 8.9 mg/dL (ref 8.9–10.3)
Chloride: 99 mmol/L (ref 98–111)
Creatinine, Ser: 2.11 mg/dL — ABNORMAL HIGH (ref 0.44–1.00)
GFR calc Af Amer: 25 mL/min — ABNORMAL LOW (ref >60)
GFR calc non Af Amer: 21 mL/min — ABNORMAL LOW (ref >60)
Glucose, Bld: 98 mg/dL (ref 70–99)
Potassium: 3.9 mmol/L (ref 3.5–5.1)
Sodium: 139 mmol/L (ref 135–145)

## 2019-11-11 MED ORDER — TORSEMIDE 20 MG PO TABS: 60.0000 mg | ORAL_TABLET | Freq: Two times a day (BID) | ORAL | 3 refills | Status: DC

## 2019-11-11 MED ORDER — TORSEMIDE 20 MG PO TABS: 60.0000 mg | ORAL_TABLET | Freq: Two times a day (BID) | ORAL | Status: DC

## 2019-11-11 NOTE — Progress Notes (Addendum)
Advanced Heart Failure Rounding Note  PCP-Cardiologist: [name], MD   Subjective:   Feeling better. Wants to go home.   RHC Findings: RA = 18 RV = 34/14 PA = 35/18 (25) PCW = 16 Fick cardiac output/index = 5.8/3.6 PVR = 1.5 WU FA sat = 99% PA sat = 66%, 67% High SVC = 66% PAPi = 0.9 Assessment: 1. Predominant RV failure 2. Mild PAH 3. Normal left-sided pressures 4. Normal cardiac output Plan/Discussion: Hemodynamics suggest advanced RV failure with PAPi < 1.0 however CO is normal. Etiology of RV failure unclear. Primary RV issue versus burned out PAH. If it was the laltter would expect CO to be down. Consider cMRI if she can tolerate. Diurese slowly.     Objective:   Weight Range: 56.6 kg Body mass index is 21.4 kg/m.   Vital Signs:   Temp:  [98.3 F (36.8 C)-99.2 F (37.3 C)] 98.9 F (37.2 C) (08/18 0757) Pulse Rate:  [71-85] 79 (08/18 0846) Resp:  [16-20] 18 (08/18 0757) BP: (90-116)/(52-70) 98/56 (08/18 0846) SpO2:  [93 %-98 %] 96 % (08/18 0846) Weight:  [56.6 kg] 56.6 kg (08/18 0336) Last BM Date: 11/10/19  Weight change: Filed Weights   11/09/19 0253 11/10/19 0407 11/11/19 0336  Weight: 57.5 kg 57.3 kg 56.6 kg    Intake/Output:   Intake/Output Summary (Last 24 hours) at 11/11/2019 1000 Last data filed at 11/11/2019 0845 Gross per 24 hour  Intake 600 ml  Output 1000 ml  Net -400 ml      Physical Exam   General:  Elderly.  No resp difficulty HEENT: normal Neck: supple. JVP 8-9 . Carotids 2+ bilat; no bruits. No lymphadenopathy or thryomegaly appreciated. Cor: PMI nondisplaced. Irregular rate & rhythm. No rubs, gallops or murmurs. Lungs: clear Abdomen: soft, nontender, nondistended. No hepatosplenomegaly. No bruits or masses. Good bowel sounds. Extremities: no cyanosis, clubbing, rash, edema Neuro: alert & orientedx3, cranial nerves grossly intact. moves all 4 extremities w/o difficulty. Affect pleasant   Telemetry   A fib  80s   EKG    No new EKG to review   Labs    CBC Recent Labs    11/09/19 0410 11/09/19 1525 11/09/19 1530 11/10/19 0900  WBC 5.0  --   --  5.9  HGB 8.3*   < > 9.5* 8.5*  HCT 27.5*   < > 28.0* 29.4*  MCV 85.9  --   --  87.0  PLT 180  --   --  158   < > = values in this interval not displayed.   Basic Metabolic Panel Recent Labs    11/09/19 0410 11/09/19 1525 11/09/19 1530 11/10/19 0900  NA 140   < > 148* 139  K 3.7   < > 3.8 4.0  CL 97*  --   --  98  CO2 32  --   --  32  GLUCOSE 87  --   --  117*  BUN 15  --   --  18  CREATININE 2.23*  --   --  2.15*  CALCIUM 8.8*  --   --  8.8*   < > = values in this interval not displayed.   Liver Function Tests No results for input(s): AST, ALT, ALKPHOS, BILITOT, PROT, ALBUMIN in the last 72 hours. No results for input(s): LIPASE, AMYLASE in the last 72 hours. Cardiac Enzymes No results for input(s): CKTOTAL, CKMB, CKMBINDEX, TROPONINI in the last 72 hours.  BNP: BNP (last 3 results)  Recent Labs    10/10/19 0907 10/29/19 1707 11/04/19 1700  BNP 1,768.0* 1,507.0* 1,511.3*    ProBNP (last 3 results) No results for input(s): PROBNP in the last 8760 hours.   D-Dimer No results for input(s): DDIMER in the last 72 hours. Hemoglobin A1C No results for input(s): HGBA1C in the last 72 hours. Fasting Lipid Panel No results for input(s): CHOL, HDL, LDLCALC, TRIG, CHOLHDL, LDLDIRECT in the last 72 hours. Thyroid Function Tests No results for input(s): TSH, T4TOTAL, T3FREE, THYROIDAB in the last 72 hours.  Invalid input(s): FREET3  Other results:   Imaging    No results found.   Medications:     Scheduled Medications: . bisoprolol  2.5 mg Oral Daily  . furosemide  80 mg Intravenous BID  . haloperidol lactate  1 mg Intravenous Once  . potassium chloride  20 mEq Oral BID  . QUEtiapine  25 mg Oral QHS  . sodium chloride flush  3 mL Intravenous Q12H  . sodium chloride flush  3 mL Intravenous Q12H     Infusions: . sodium chloride    . sodium chloride      PRN Medications: sodium chloride, sodium chloride, acetaminophen, ALPRAZolam, ondansetron (ZOFRAN) IV, sodium chloride flush, sodium chloride flush   Assessment/Plan   1. Acute on chronic heart failure with mid range EF and RV failure:  Echo this admission with EF 50% with septal HK, RV severely enlarged with mildly decreased systolic function and D-shaped septum.  Mild-moderate MR, severe TR with incomplete coaptation of tricuspid valve, IVC dilated. The pericardial effusion was moderate to large but primarily lateral and inferior, tamponade was not present. Cause of cardiomyopathy primarily involving the RV is not certain.  PA pressure is not significantly elevated by echo. No chest pain. VQ scan negative for acute and chronic PEs. RHC this admit c/w advanced RV failure w/ PAPi <1.0 however normal CO and normal left sided filling pressures.  In absence of ACS and CKD stage IV, LHC was deferred.  -Diuresed with IV lasix. Plan to transition to torsemide 60 mg twice a day starting 11/12/19/  - ?Amyloid as potential etiology, recommend cMRI but she declines (claustophobia)  - Continue bisoprolol 2.5 mg daily.  - No digoxin, Entresto, spironolactone for now with CKD stage IV.  - order PFTs  2. Atrial fibrillation: Chronic, has failed DCCV and Tikosyn. She has not tolerated amiodarone. Rate control good on bisoprolol 2.5 mg daily.  - Continue bisoprolol 2.5 daily.  - Hgb trending up, 8.5 . No signs of active bleeding.  - keep of of a/c for now given chronic anemia and refusal to accept blood transfusion  3. Anemia: Multifactorial, suspect due to renal disease as well as GI bleeding. Hgb down to 6.5 recently in setting of GI bleeding with FOBT+. Hgb 8.5.    - To followup with GI for capsule endoscopy eventually.  - No blood products (Jehovah's Witness).  - Fe studies ok, Ferritin 311, Fe 74, Sats 20% 4. CKD: Stage IV. Renal  function followed closely and remained stable.  - followed by outpatient nephrology  5. Pericardial effusion: Large on 6/21 echo without tamponade.  Repeat echo 8/11 showed moderate to large primarily lateral and inferior effusion without tamponade.  - hemodynamically stable. TSH WNL  6. Delirium  - Resolved.  - continue Seroquel at bedtime.   Home today.    Length of Stay: [place], NP  11/11/2019, 10:00 AM  Advanced Heart Failure Team Pager [phone] (M-F; 7a -  4p)  Please contact [facility] for night-coverage after hours (4p -7a ) and weekends on [email or web address]  Patient seen and examined with the above-signed Advanced Practice Provider and/or Housestaff. I personally reviewed laboratory data, imaging studies and relevant notes. I independently examined the patient and formulated the important aspects of the plan. I have edited the note to reflect any of my changes or salient points. I have personally discussed the plan with the patient and/or family.  She feels great today. No sob, orthopnea or PND.   General:  Lying flat in bed No resp difficulty HEENT: normal Neck: supple. jvp to jaw. Carotids 2+ bilat; no bruits. No lymphadenopathy or thryomegaly appreciated. Cor: PMI nondisplaced. irregular rate & rhythm.2/6 TR Lungs: clear with decreased breath sounds throughout Abdomen: soft, nontender, nondistended. No hepatosplenomegaly. No bruits or masses. Good bowel sounds. Extremities: no cyanosis, clubbing, rash, edema Neuro: alert & orientedx3, cranial nerves grossly intact. moves all 4 extremities w/o difficulty. Affect pleasant  Volume status much better.but neck veins are still up. Etiology of RV failure is unclear. Unable to do cMRI due to claustrophobia. Will plan d/c today. Increase torsemide. F/u in HF Clinic.  Glori Bickers, MD  9:12 PM

## 2019-11-11 NOTE — Progress Notes (Signed)
D/C instructions given and reviewed. Questions answered. Encouraged to call with further questions/concerns. Husband at bedside to transport.

## 2019-11-11 NOTE — Plan of Care (Signed)

## 2019-11-11 NOTE — TOC Transition Note (Signed)
Transition of Care [facility]) - CM/SW Discharge Note   Patient Details  Name: BRECKLYN GALVIS MRN: [id number] Date of Birth: Jan 23, 1941  Transition [facility] Care [facility]) CM/SW Contact:  Zenon Mayo, RN Phone Number: 11/11/2019, 12:05 PM   Clinical Narrative:    NCM spoke with patient and her spouse at bedside, she was up walking around pretty well. She states she does not need any HH services. She states she does not need any DME and she does not have any DME at home.   She conts on iv lasix at this time.  TOC will continue to follow .   Final next level of care: Home/Self Care Barriers to Discharge: No Barriers Identified   Patient Goals and CMS Choice Patient states their goals for this hospitalization and ongoing recovery are:: get better      Discharge Placement                       Discharge Plan and Services   Discharge Planning Services: CM Consult              DME Agency: NA       HH Arranged: NA          Social Determinants of Health (SDOH) Interventions Food Insecurity Interventions: Intervention Not Indicated Social Connections Interventions: Intervention Not Indicated   Readmission Risk Interventions Readmission Risk Prevention Plan 11/10/2019 10/30/2019  Transportation Screening Complete Complete  Medication Review Press photographer) Complete Complete  PCP or Specialist appointment within 3-5 days of discharge - Complete  HRI or [place] Complete Patient refused  SW Recovery Care/Counseling Consult Complete Complete  Palliative Care Screening Not Applicable Not [place] Not Applicable Not Applicable  Some recent data might be hidden

## 2019-11-12 ENCOUNTER — Other Ambulatory Visit: Payer: Self-pay | Admitting: *Deleted

## 2019-11-12 NOTE — Patient Outreach (Signed)
[name]) Care Management  11/12/2019  Bailey Bishop [birth year] [id number]   Notified that member was discharged home on 8/18.  Call placed to member for assessment of needs following frequent admissions.  This care manager introduced self and stated purpose of call. [facility] care management services explained.  Member declined involvement with program, state she doesn't need anyone to help with management of her healthcare.  Attempted to review benefits of program, she again declines.  Offered to mail brochure and this care manager's contact information for future purposes, she declines stating she already has some information.  Will not open case at this time as member declined services.  Valente David, [place], MSN [place] [phone]

## 2019-11-13 ENCOUNTER — Telehealth: Payer: Self-pay | Admitting: Family Medicine

## 2019-11-13 NOTE — Telephone Encounter (Signed)
Per d/c note by [name], the pt was taken off her Eliquis while in the hospital. Discharge note states "It was decided to permamently discontinue Eliquis given her chronic anemia, bleed risk and refusal to accept blood products".   Will leave note for Lattie Haw to call her on Monday.

## 2019-11-13 NOTE — Telephone Encounter (Signed)
New message     Patient would like you to call her Monday , she said that Dr Aundra Dubin at [place] [facility] took her off of all blood thinners, can you please check into this ?

## 2019-11-16 NOTE — Telephone Encounter (Signed)
Spoke with pt after reviewing D/C Summary.  Explained again why Dr Haroldine Laws discontinued Eliquis.  Told her I agreed with plan due to risk factors mentioned.  She is in agreement and was appreciative of call.

## 2019-11-17 ENCOUNTER — Ambulatory Visit (HOSPITAL_COMMUNITY)
Admit: 2019-11-17 | Discharge: 2019-11-17 | Disposition: A | Discharge status: Home / Self Care | Payer: Medicare Other | Attending: Internal Medicine | Admitting: Internal Medicine

## 2019-11-17 ENCOUNTER — Other Ambulatory Visit (HOSPITAL_COMMUNITY): Payer: Self-pay | Admitting: *Deleted

## 2019-11-17 ENCOUNTER — Other Ambulatory Visit: Payer: Self-pay

## 2019-11-17 ENCOUNTER — Encounter (HOSPITAL_COMMUNITY): Payer: Self-pay

## 2019-11-17 VITALS — BP 118/64 | HR 82 | Wt 124.4 lb

## 2019-11-17 DIAGNOSIS — J90 Pleural effusion, not elsewhere classified: Secondary | ICD-10-CM

## 2019-11-17 DIAGNOSIS — K219 Gastro-esophageal reflux disease without esophagitis: Secondary | ICD-10-CM | POA: Diagnosis not present

## 2019-11-17 DIAGNOSIS — I130 Hypertensive heart and chronic kidney disease with heart failure and stage 1 through stage 4 chronic kidney disease, or unspecified chronic kidney disease: Secondary | ICD-10-CM | POA: Insufficient documentation

## 2019-11-17 DIAGNOSIS — I482 Chronic atrial fibrillation, unspecified: Secondary | ICD-10-CM | POA: Insufficient documentation

## 2019-11-17 DIAGNOSIS — Z87891 Personal history of nicotine dependence: Secondary | ICD-10-CM | POA: Diagnosis not present

## 2019-11-17 DIAGNOSIS — I429 Cardiomyopathy, unspecified: Secondary | ICD-10-CM | POA: Diagnosis not present

## 2019-11-17 DIAGNOSIS — D631 Anemia in chronic kidney disease: Secondary | ICD-10-CM | POA: Insufficient documentation

## 2019-11-17 DIAGNOSIS — I5032 Chronic diastolic (congestive) heart failure: Secondary | ICD-10-CM

## 2019-11-17 DIAGNOSIS — D649 Anemia, unspecified: Secondary | ICD-10-CM

## 2019-11-17 DIAGNOSIS — J45909 Unspecified asthma, uncomplicated: Secondary | ICD-10-CM | POA: Diagnosis not present

## 2019-11-17 DIAGNOSIS — Z79899 Other long term (current) drug therapy: Secondary | ICD-10-CM | POA: Diagnosis not present

## 2019-11-17 DIAGNOSIS — Z853 Personal history of malignant neoplasm of breast: Secondary | ICD-10-CM | POA: Diagnosis not present

## 2019-11-17 DIAGNOSIS — N184 Chronic kidney disease, stage 4 (severe): Secondary | ICD-10-CM | POA: Insufficient documentation

## 2019-11-17 DIAGNOSIS — Z9013 Acquired absence of bilateral breasts and nipples: Secondary | ICD-10-CM | POA: Diagnosis not present

## 2019-11-17 DIAGNOSIS — F40240 Claustrophobia: Secondary | ICD-10-CM | POA: Diagnosis not present

## 2019-11-17 DIAGNOSIS — I4819 Other persistent atrial fibrillation: Secondary | ICD-10-CM | POA: Diagnosis not present

## 2019-11-17 DIAGNOSIS — I5022 Chronic systolic (congestive) heart failure: Secondary | ICD-10-CM | POA: Diagnosis not present

## 2019-11-17 DIAGNOSIS — D62 Acute posthemorrhagic anemia: Secondary | ICD-10-CM

## 2019-11-17 MED ORDER — TORSEMIDE 20 MG PO TABS: 60.0000 mg | ORAL_TABLET | Freq: Two times a day (BID) | ORAL | 3 refills | Status: DC

## 2019-11-17 NOTE — Progress Notes (Signed)
PCP: [name], MD HF Cardiology: Dr. Aundra Dubin Nephrology: Dr Synetta Shadow  79 y.o. with history of CKD 4, chronic atrial fibrillation, anemia, and chronic systolic CHF: was referred by [name] for evaluation of CHF.  Patient has a complex past history.  She has a long history of atrial fibrillation, now chronic as she has failed DCCV and Tikosyn and did not tolerate amiodarone.  Her last echo in 6/21 showed EF 35-40% with mild RV dysfunction, large pericardial effusion without tamponade, moderate MR, moderate TR.  Cause of cardiomyopathy is not certain.    She has been admitted multiple times this year to [facility]. She feels like hospitalizations have not helped her shortness of breath.  Most recently, she was admitted to [facility] 10/29/[facility].  She was found to be anemic with hgb 6.4, FOBT+.  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  Hgb was 7.5 at time of discharge.  Eliquis was helped for several days, she was told to restart it on 8/11.   Admitted 5/00/37 a/c systolic heart failure and started on IV Lasix. Hgb 7.4 on admit. STAT echo repeated to reassess her pericardial effusion. Study showed EF 50% with septal HK, RV severely enlarged with mildly decreased systolic function and D-shaped septum. Mild-moderate MR, severe TR with incomplete coaptation of tricuspid valve, IVC dilated. The pericardial effusion was moderate to large but primarily lateral and inferior, tamponade was not present. RHC c/w advanced RV failure w/ PAPi <1.0 however normal CO.Normal left sided filling pressures.Unsure if this is primary RV issue vs burned out PAH.  V/Q scan negative for acute and chronic PEs. PFTs ordered (pending). There was concern for possible cardiac amyloid, however pt declined cMRI. Once adequately diuresed, she was placed back on PO diuretics. It was decided to permamently discontinue Eliquis given her chronic anemia, bleed risk and refusal to accept blood products.   Today she returns for  HF follow up.Overall feeling much stronger. Says some mornings she has some dizziness.  Denies SOB/PND/Orthopnea. No BRBPR. Appetite ok. No fever or chills. Weight at home 124  pounds. Taking all medications. Lives with her husband.   Labs 11/10/19 : K 3.9 Creatinine 2.11 Hgb 8.5  Labs (8/21): K 3.9, creatinine 2.35, hgb 7.5, FOBT+ Labs (7/15 1/21): Kappa light chain 118 , lamda 73  No  Spike   PMH: 1. CKD stage 4 2. Chronic atrial fibrillation: Failed DCCV in 11/18.  - Failed Tikosyn in 9/19.  - Intolerant of calcium channel blockers.  - Intolerant of amiodarone.  3. HTN 4. Asthma 5. GERD 6. Chronic systolic CHF: Uncertain etiology.  - Echo (6/21): EF 35-40%, global hypokinesis, mildly decreased RV systolic function with mild RV dilation, biatrial enlargement, large pericardial effusion without tamponade, moderate MR, moderate TR, IVC dilated.  7. Anemia of chronic renal disease.  8. Breast cancer: Bilateral mastectomy.  9. GI bleeding: Admission in 8/21. EGD/c-scope in 6/21 negative.   Social History   Socioeconomic History  . Marital status: Married    Spouse name: Bailey Bishop  . Number of children: Not on file  . Years of education: Not on file  . Highest education level: Not on file  Occupational History  . Not on file  Tobacco Use  . Smoking status: Former Smoker    Packs/day: 1.00    Years: 28.00    Pack years: 28.00    Types: Cigarettes    Quit date: 07/17/1983    Years since quitting: 36.3  . Smokeless  tobacco: Never Used  Vaping Use  . Vaping Use: Never used  Substance and Sexual Activity  . Alcohol use: No  . Drug use: No  . Sexual activity: Not on file  Other Topics Concern  . Not on file  Social History Narrative  . Not on file   Social Determinants of Health   Financial Resource Strain:   . Difficulty of Paying Living Expenses: Not on file  Food Insecurity: No Food Insecurity  . Worried About Charity fundraiser in the Last Year: Never true  . Ran Out  of Food in the Last Year: Never true  Transportation Needs: No Transportation Needs  . Lack of Transportation (Medical): No  . Lack of Transportation (Non-Medical): No  Physical Activity:   . Days of Exercise per Week: Not on file  . Minutes of Exercise per Session: Not on file  Stress:   . Feeling of Stress : Not on file  Social Connections: Moderately Integrated  . Frequency of Communication with Friends and Family: Once a week  . Frequency of Social Gatherings with Friends and Family: Once a week  . Attends Religious Services: 1 to 4 times per year  . Active Member of Clubs or Organizations: No  . Attends Archivist Meetings: 1 to 4 times per year  . Marital Status: Married  Human resources officer Violence:   . Fear of Current or Ex-Partner: Not on file  . Emotionally Abused: Not on file  . Physically Abused: Not on file  . Sexually Abused: Not on file   Family History  Problem Relation Age of Onset  . Cancer Mother   . Aneurysm Father   . Cancer Sister   . Cancer Sister   . Anesthesia problems Neg Hx   . Hypotension Neg Hx   . Malignant hyperthermia Neg Hx   . Pseudochol deficiency Neg Hx   . Colon cancer Neg Hx    ROS: All systems reviewed and negative except as per HPI.  Current Outpatient Medications  Medication Sig Dispense Refill  . acetaminophen (TYLENOL) 650 MG CR tablet Take 650 mg by mouth every 8 (eight) hours as needed for pain.    Marland Kitchen albuterol (VENTOLIN HFA) 108 (90 Base) MCG/ACT inhaler Inhale 1-2 puffs into the lungs every 6 (six) hours as needed for wheezing or shortness of breath.    . ALPRAZolam (XANAX) 0.5 MG tablet Take 0.5 mg by mouth at bedtime as needed for anxiety.     . bisoprolol (ZEBETA) 5 MG tablet Take 0.5 tablets (2.5 mg total) by mouth daily. 15 tablet 3  . calcium citrate-vitamin D (CITRACAL+D) 315-200 MG-UNIT tablet Take 1 tablet by mouth 2 (two) times daily.    . cetirizine HCl (ZYRTEC) 1 MG/ML solution Take 5 mLs (5 mg total) by  mouth daily. 60 mL 0  . fluticasone (FLONASE) 50 MCG/ACT nasal spray Place 2 sprays into both nostrils daily. 16 g 0  . loperamide (IMODIUM) 2 MG capsule Take 1 capsule (2 mg total) by mouth every 8 (eight) hours as needed for diarrhea or loose stools. 15 capsule 0  . omeprazole (PRILOSEC) 40 MG capsule Take 40 mg by mouth as needed.    . potassium chloride SA (KLOR-CON) 20 MEQ tablet Take 1 tablet (20 mEq total) by mouth 2 (two) times daily. Start on 09/28/2019 when you restart Lasix 60 tablet 3  . torsemide (DEMADEX) 20 MG tablet Take 3 tablets (60 mg total) by mouth 2 (two) times daily. [place]  tablet 3  . traZODone (DESYREL) 50 MG tablet Take 1 tablet (50 mg total) by mouth at bedtime as needed for sleep. 30 tablet 5   No current facility-administered medications for this encounter.   BP 118/64   Pulse 82   Wt 56.4 kg   SpO2 97%   BMI 21.35 kg/m   Wt Readings from Last 3 Encounters:  11/17/19 56.4 kg  11/11/19 56.6 kg  11/02/19 68.1 kg   General:  Well appearing. No resp difficulty HEENT: normal Neck: supple. no JVD. Carotids 2+ bilat; no bruits. No lymphadenopathy or thryomegaly appreciated. Cor: PMI nondisplaced. Irregular rate & rhythm. No rubs, gallops or murmurs. Lungs: clear Abdomen: soft, nontender, nondistended. No hepatosplenomegaly. No bruits or masses. Good bowel sounds. Extremities: no cyanosis, clubbing, rash, edema Neuro: alert & orientedx3, cranial nerves grossly intact. moves all 4 extremities w/o difficulty. Affect pleasant  Assessment/Plan: 1. Chronic systolic CHF: Echo in 7/51 with EF 35-40% with mild RV dysfunction, large pericardial effusion without tamponade, moderate MR, moderate TR.  Cause of cardiomyopathy is not certain.  - Unable to obtain MRI due to claustrophobia.  - NYHA II.  Volume status stable.  Torsemide 60 mg twice a day.  - Continue bisoprolol 2.5 mg daily.  - No digoxin, Entresto, spironolactone for now with CKD stage IV.  - In absence of ACS,  would not plan for LHC/RHC given CKD stage IV.  - Plan for BMET at Nephrology.  2. Atrial fibrillation: Chronic, has failed DCCV and Tikosyn.  She has not tolerated amiodarone.  - Rate controlled.  - Continue bisoprolol 2.5 daily.  - No eliquis with recent pericardial effusion + jehovahs witness.  3. Anemia: Multifactorial, suspect due to renal disease as well as GI bleeding.  Hgb down to 6.5 recently in setting of GI bleeding with FOBT+.  Most recent Hgb 8.5. No obvious source of bleeding.  - To follow up with GI for capsule endoscopy.  - No blood products (Jehovah's Witness).  - Referred to hematology in [place] for anemia.   4. CKD: Stage IV.  Watch creatinine closely with diuresis.  - She has a nephrologist in [place] with follow up later this week.  5. Pericardial effusion: Large on 6/21 echo without tamponade.  Repeat echo 8/11 showed moderate to large primarily lateral and inferior effusion without tamponade. Repeat ECHO in 6 weeks.   She is getting CBC and BMET this week at Nephrology.  Today she was referred to hematology.   Try to set up PYP scan.    Follow up 4-6 with Dr Aundra Dubin. Will need to repeat ECHO at that time to reassess pericardial effusion.   Winson Eichorn NP-C  11/17/2019

## 2019-11-18 NOTE — Addendum Note (Signed)
Encounter addended by: Kerry Dory, CMA on: 11/18/2019 9:00 AM  Actions taken: Visit diagnoses modified, Order list changed, Diagnosis association updated

## 2019-11-19 DIAGNOSIS — E211 Secondary hyperparathyroidism, not elsewhere classified: Secondary | ICD-10-CM | POA: Diagnosis not present

## 2019-11-19 DIAGNOSIS — D508 Other iron deficiency anemias: Secondary | ICD-10-CM | POA: Diagnosis not present

## 2019-11-19 DIAGNOSIS — N184 Chronic kidney disease, stage 4 (severe): Secondary | ICD-10-CM | POA: Diagnosis not present

## 2019-11-19 DIAGNOSIS — D638 Anemia in other chronic diseases classified elsewhere: Secondary | ICD-10-CM | POA: Diagnosis not present

## 2019-11-19 DIAGNOSIS — I129 Hypertensive chronic kidney disease with stage 1 through stage 4 chronic kidney disease, or unspecified chronic kidney disease: Secondary | ICD-10-CM | POA: Diagnosis not present

## 2019-11-25 NOTE — Progress Notes (Signed)
[facility] [address], [place] [address]   CLINIC:  Medical Oncology/Hematology  Patient Care Team: [name], MD as PCP - General (Internal Medicine) Evans Lance, MD as PCP - Electrophysiology (Cardiology) Larey Dresser, MD as PCP - Cardiology (Cardiology) Vallarie Mare, MD as Consulting Physician (Neurosurgery)  CHIEF COMPLAINTS/PURPOSE OF CONSULTATION:  Evaluation of symptomatic anemia in a Jehovah witness.  HISTORY OF PRESENTING ILLNESS:  Bailey Bishop 79 y.o. female is here because of evaluation of symptomatic anemia, at the request of Darrick Grinder, NP from [facility]. She has a history of CKD stage IV, chronic a-fib, anemia and chronic CHF. Her hemoglobin has been consistently low since 09/24/2016. She received 510 mg of Feraheme on 10/31/2019.  Today she is accompanied by her husband. She reports that she was taken off Eliquis about 3 weeks ago. She was started on daily iron tablets last week and denies having constipation with it. She denies having melena, hematochezia, or hematuria. She has a history of breast cancer in her right breast and had bilateral mastectomy 21 years ago and had 6 months of chemo with Dr. Abran Duke. She denies any history of past blood clots. She denies having numbness or tingling.  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 She is able to do her ADL's, though she has to move a bit slower. She used to work in [employer]. She quit smoking at age 24, smoking 1 PPD for 25 years. She denies history of anemia in her family. Her sister deceased from breast cancer; her other sister has breast cancer.   MEDICAL HISTORY:  Past Medical History:  Diagnosis Date  . Anxiety   . Arthritis   . Asthma   . Atrial fibrillation ([place])    a. s/p unsuccessful DCCV in 01/2017 and intolerant to Amiodarone --> Rate-control strategy pursued since b. failed Tikosyn in 11/2017  . Breast cancer ([place]) 2001   Right  .  CHF (congestive heart failure) ([place])   . Essential hypertension   . GERD (gastroesophageal reflux disease)   . T10 vertebral fracture ([place])     SURGICAL HISTORY: Past Surgical History:  Procedure Laterality Date  . BIOPSY N/A 04/22/2018   Procedure: BIOPSY;  Surgeon: Aviva Signs, MD;  [facility];  Service: Gastroenterology;  Laterality: N/A;  . BIOPSY  09/06/2019   Procedure: BIOPSY;  Surgeon: Daneil Dolin, MD;  [facility];  Service: Endoscopy;;  gastric  . CARDIOVERSION N/A 02/18/2017   Procedure: CARDIOVERSION;  Surgeon: Josue Hector, MD;  Location: [name] ENDOSCOPY;  Service: Cardiovascular;  Laterality: N/A;  . CARDIOVERSION N/A 12/04/2017   Procedure: CARDIOVERSION;  Surgeon: [name], MD;  Location: [facility];  Service: Cardiovascular;  Laterality: N/A;  . CATARACT EXTRACTION W/PHACO  07/23/2011   Procedure: CATARACT EXTRACTION PHACO AND INTRAOCULAR LENS PLACEMENT ([place]);  Surgeon: Williams Che, MD;  Location: [facility] ORS;  Service: Ophthalmology;  Laterality: Right;  CDE:9.96  . CATARACT EXTRACTION W/PHACO Left 11/13/2018   Procedure: CATARACT EXTRACTION PHACO AND INTRAOCULAR LENS PLACEMENT (I[facility]);  Surgeon: Baruch Goldmann, MD;  Location: [facility];  Service: Ophthalmology;  Laterality: Left;  left, CDE: 7.44  . CHOLECYSTECTOMY N/A 02/14/2015   Procedure: LAPAROSCOPIC CHOLECYSTECTOMY;  Surgeon: Aviva Signs, MD;  Location: [facility];  Service: General;  Laterality: N/A;  . COLONOSCOPY N/A 04/22/2018   Procedure: COLONOSCOPY;  Surgeon: Aviva Signs, MD;  [facility];  Service: Gastroenterology;  Laterality: N/A;  . COLONOSCOPY WITH PROPOFOL N/A 09/06/2019   Procedure: COLONOSCOPY WITH PROPOFOL;  Surgeon: Daneil Dolin, MD;  [facility];  Service: Endoscopy;  Laterality: N/A;  . ESOPHAGEAL DILATION  09/06/2019   Procedure: ESOPHAGEAL DILATION;  Surgeon: Daneil Dolin, MD;  [facility];  Service: Endoscopy;;  .  ESOPHAGOGASTRODUODENOSCOPY N/A 04/22/2018   Procedure: ESOPHAGOGASTRODUODENOSCOPY (EGD);  Surgeon: Aviva Signs, MD;  [facility];  Service: Gastroenterology;  Laterality: N/A;  . ESOPHAGOGASTRODUODENOSCOPY (EGD) WITH PROPOFOL N/A 09/06/2019   Procedure: ESOPHAGOGASTRODUODENOSCOPY (EGD) WITH PROPOFOL;  Surgeon: Daneil Dolin, MD;  [facility];  Service: Endoscopy;  Laterality: N/A;  . GIVENS CAPSULE STUDY N/A 10/31/2019   Procedure: GIVENS CAPSULE STUDY;  Surgeon: Harvel Quale, MD;  [facility];  Service: Gastroenterology;  Laterality: N/A;  . MASTECTOMY     bilateral mastectomy-right breast cancer-left taken by choice  . POLYPECTOMY  04/22/2018   Procedure: POLYPECTOMY;  Surgeon: Aviva Signs, MD;  [facility];  Service: Gastroenterology;;  . POLYPECTOMY  09/06/2019   Procedure: POLYPECTOMY;  Surgeon: Daneil Dolin, MD;  [facility];  Service: Endoscopy;;  cecal, ascending, hepatic flexure, sigmoid  . RIGHT HEART CATH N/A 11/09/2019   Procedure: RIGHT HEART CATH;  Surgeon: Jolaine Artist, MD;  Location: [facility];  Service: Cardiovascular;  Laterality: N/A;  . SEPTOPLASTY  1995    SOCIAL HISTORY: Social History   Socioeconomic History  . Marital status: Married    Spouse name: [name]  . Number of children: 1  . Years of education: Not on file  . Highest education level: Not on file  Occupational History  . Occupation: retired  Tobacco Use  . Smoking status: Former Smoker    Packs/day: 1.00    Years: 28.00    Pack years: 28.00    Types: Cigarettes    Quit date: 07/17/1983    Years since quitting: 36.3  . Smokeless tobacco: Never Used  Vaping Use  . Vaping Use: Never used  Substance and Sexual Activity  . Alcohol use: No  . Drug use: No  . Sexual activity: Not on file  Other Topics Concern  . Not on file  Social History Narrative  . Not on file   Social Determinants of Health    Financial Resource Strain:   . Difficulty of Paying Living Expenses: Not on file  Food Insecurity: No Food Insecurity  . Worried About Charity fundraiser in the Last Year: Never true  . Ran Out of Food in the Last Year: Never true  Transportation Needs: No Transportation Needs  . Lack of Transportation (Medical): No  . Lack of Transportation (Non-Medical): No  Physical Activity:   . Days of Exercise per Week: Not on file  . Minutes of Exercise per Session: Not on file  Stress:   . Feeling of Stress : Not on file  Social Connections: Moderately Integrated  . Frequency of Communication with Friends and Family: Once a week  . Frequency of Social Gatherings with Friends and Family: Once a week  . Attends Religious Services: 1 to 4 times per year  . Active Member of Clubs or Organizations: No  . Attends Archivist Meetings: 1 to 4 times per year  . Marital Status: Married  Human resources officer Violence:   . Fear of Current or Ex-Partner: Not on file  . Emotionally Abused: Not on file  . Physically  Abused: Not on file  . Sexually Abused: Not on file    FAMILY HISTORY: Family History  Problem Relation Age of Onset  . Cancer Mother   . Aneurysm Father   . Cancer Sister   . Cancer Sister   . Anesthesia problems Neg Hx   . Hypotension Neg Hx   . Malignant hyperthermia Neg Hx   . Pseudochol deficiency Neg Hx   . Colon cancer Neg Hx     ALLERGIES:  is allergic to cardizem cd [diltiazem hcl er beads], amiodarone, metoprolol tartrate, sulfa antibiotics, tomato, carvedilol, and chocolate.  MEDICATIONS:  Current Outpatient Medications  Medication Sig Dispense Refill  . ALPRAZolam (XANAX) 0.5 MG tablet Take 0.5 mg by mouth at bedtime.     . bisoprolol (ZEBETA) 5 MG tablet Take 0.5 tablets (2.5 mg total) by mouth daily. 15 tablet 3  . calcium citrate-vitamin D (CITRACAL+D) 315-200 MG-UNIT tablet Take 1 tablet by mouth 2 (two) times daily.    . cetirizine HCl (ZYRTEC) 1 MG/ML  solution Take 5 mLs (5 mg total) by mouth daily. 60 mL 0  . epoetin alfa (EPOGEN) 3000 UNIT/ML injection Inject into the skin.    . fluticasone (FLONASE) 50 MCG/ACT nasal spray Place 2 sprays into both nostrils daily. 16 g 0  . omeprazole (PRILOSEC) 40 MG capsule Take 40 mg by mouth as needed.    . potassium chloride SA (KLOR-CON) 20 MEQ tablet Take 1 tablet (20 mEq total) by mouth 2 (two) times daily. Start on 09/28/2019 when you restart Lasix 60 tablet 3  . torsemide (DEMADEX) 20 MG tablet Take 3 tablets (60 mg total) by mouth 2 (two) times daily. 180 tablet 3  . acetaminophen (TYLENOL) 650 MG CR tablet Take 650 mg by mouth every 8 (eight) hours as needed for pain. (Patient not taking: Reported on 11/26/2019)    . albuterol (VENTOLIN HFA) 108 (90 Base) MCG/ACT inhaler Inhale 1-2 puffs into the lungs every 6 (six) hours as needed for wheezing or shortness of breath. (Patient not taking: Reported on 11/26/2019)    . loperamide (IMODIUM) 2 MG capsule Take 1 capsule (2 mg total) by mouth every 8 (eight) hours as needed for diarrhea or loose stools. (Patient not taking: Reported on 11/26/2019) 15 capsule 0   No current facility-administered medications for this visit.    REVIEW OF SYSTEMS:   Review of Systems  Constitutional: Positive for appetite change (mildly decreased) and fatigue (severe).  HENT:   Positive for mouth sores.   Respiratory: Positive for shortness of breath.   Gastrointestinal: Negative for blood in stool and constipation.  Genitourinary: Negative for hematuria.   Neurological: Positive for dizziness (occasional). Negative for numbness.  All other systems reviewed and are negative.    PHYSICAL EXAMINATION: ECOG PERFORMANCE STATUS: 1 - Symptomatic but completely ambulatory  Vitals:   11/26/19 0818  BP: 105/70  Pulse: 75  Resp: 18  Temp: 98.1 F (36.7 C)  SpO2: 98%   Filed Weights   11/26/19 0818  Weight: 126 lb (57.2 kg)   Physical Exam Vitals reviewed.    Constitutional:      Appearance: Normal appearance.  Cardiovascular:     Rate and Rhythm: Normal rate and regular rhythm.     Pulses: Normal pulses.     Heart sounds: Normal heart sounds.  Pulmonary:     Effort: Pulmonary effort is normal.     Breath sounds: Normal breath sounds.  Chest:     Breasts:  Right: Absent.        Left: Absent.  Abdominal:     Palpations: Abdomen is soft. There is no hepatomegaly, splenomegaly or mass.     Tenderness: There is no abdominal tenderness.     Hernia: No hernia is present.  Musculoskeletal:     Right lower leg: No edema.     Left lower leg: No edema.  Neurological:     General: No focal deficit present.     Mental Status: She is alert and oriented to person, place, and time.  Psychiatric:        Mood and Affect: Mood normal.        Behavior: Behavior normal.      LABORATORY DATA:  I have reviewed the data as listed Recent Results (from the past 2160 hour(s))  SARS CORONAVIRUS 2 (TAT 6-24 HRS) Nasopharyngeal Nasopharyngeal Swab     Status: None   Collection Time: 09/03/19  9:31 AM   Specimen: Nasopharyngeal Swab  Result Value Ref Range   SARS Coronavirus 2 NEGATIVE NEGATIVE    Comment: (NOTE) SARS-CoV-2 target nucleic acids are NOT DETECTED.  The SARS-CoV-2 RNA is generally detectable in upper and lower respiratory specimens during the acute phase of infection. Negative results do not preclude SARS-CoV-2 infection, do not rule out co-infections with other pathogens, and should not be used as the sole basis for treatment or other patient management decisions. Negative results must be combined with clinical observations, patient history, and epidemiological information. The expected result is Negative.  Fact Sheet for Patients: SugarRoll.be  Fact Sheet for Healthcare Providers: https://www.woods-mathews.com/  This test is not yet approved or cleared by the [place] FDA and   has been authorized for detection and/or diagnosis of SARS-CoV-2 by FDA under an Emergency Use Authorization (EUA). This EUA will remain  in effect (meaning this test can be used) for the duration of the COVID-19 declaration under Se ction 564(b)(1) of the Act, 21 U.S.C. section 360bbb-3(b)(1), unless the authorization is terminated or revoked sooner.  Performed at [facility], [place] [address], [place], [place] [address]   CBC with Differential     Status: Abnormal   Collection Time: 09/03/19  9:55 AM  Result Value Ref Range   WBC 6.7 4.0 - 10.5 K/uL   RBC 3.12 (L) 3.87 - 5.11 MIL/uL   Hemoglobin 8.9 (L) 12.0 - 15.0 g/dL   HCT 28.1 (L) 36 - 46 %   MCV 90.1 80.0 - 100.0 fL   MCH 28.5 26.0 - 34.0 pg   MCHC 31.7 30.0 - 36.0 g/dL   RDW 14.6 11.5 - 15.5 %   Platelets 229 150 - 400 K/uL   nRBC 0.0 0.0 - 0.2 %   Neutrophils Relative % 72 %   Neutro Abs 4.8 1.7 - 7.7 K/uL   Lymphocytes Relative 12 %   Lymphs Abs 0.8 0.7 - 4.0 K/uL   Monocytes Relative 11 %   Monocytes Absolute 0.7 0 - 1 K/uL   Eosinophils Relative 4 %   Eosinophils Absolute 0.3 0 - 0 K/uL   Basophils Relative 1 %   Basophils Absolute 0.0 0 - 0 K/uL   Immature Granulocytes 0 %   Abs Immature Granulocytes 0.02 0.00 - 0.07 K/uL    Comment: Performed at [facility], [address] [place], [place] [address]  Brain natriuretic peptide     Status: Abnormal   Collection Time: 09/03/19  9:55 AM  Result Value Ref Range  B Natriuretic Peptide 715.0 (H) 0.0 - 100.0 pg/mL    Comment: Performed at [facility], [address] [place], [place] [address]  Troponin I (High Sensitivity)     Status: None   Collection Time: 09/03/19  9:55 AM  Result Value Ref Range   Troponin I (High Sensitivity) 15 <18 ng/L    Comment: (NOTE) Elevated high sensitivity troponin I (hsTnI) values and significant  changes across serial measurements may suggest ACS but many other  chronic and acute conditions are known to elevate hsTnI  results.  Refer to the Links section for chest pain algorithms and additional  guidance. Performed at [facility], [address] [place], [place] [address]   Comprehensive metabolic panel     Status: Abnormal   Collection Time: 09/03/19  9:55 AM  Result Value Ref Range   Sodium 136 135 - 145 mmol/L   Potassium 2.7 (LL) 3.5 - 5.1 mmol/L    Comment: CRITICAL RESULT CALLED TO, READ BACK BY AND VERIFIED WITH: MOORE,M AT 10:55AM ON 09/03/19 BY FESTERMAN,C    Chloride 97 (L) 98 - 111 mmol/L   CO2 27 22 - 32 mmol/L   Glucose, Bld 100 (H) 70 - 99 mg/dL    Comment: Glucose reference range applies only to samples taken after fasting for at least 8 hours.   BUN 11 8 - 23 mg/dL   Creatinine, Ser 1.43 (H) 0.44 - 1.00 mg/dL   Calcium 8.6 (L) 8.9 - 10.3 mg/dL   Total Protein 7.3 6.5 - 8.1 g/dL   Albumin 3.5 3.5 - 5.0 g/dL   AST 22 15 - 41 U/L   ALT 10 0 - 44 U/L   Alkaline Phosphatase 123 38 - 126 U/L   Total Bilirubin 1.4 (H) 0.3 - 1.2 mg/dL   GFR calc non Af Amer 35 (L) >60 mL/min   GFR calc Af Amer 40 (L) >60 mL/min   Anion gap 12 5 - 15    Comment: Performed at [facility], [address], [place], [place] [address]  POC occult blood, ED     Status: Abnormal   Collection Time: 09/03/19 11:56 AM  Result Value Ref Range   Fecal Occult Bld POSITIVE (A) NEGATIVE  Magnesium     Status: None   Collection Time: 09/03/19 12:06 PM  Result Value Ref Range   Magnesium 2.2 1.7 - 2.4 mg/dL    Comment: Performed at [facility], [address], [place], [place] [address]  Troponin I (High Sensitivity)     Status: None   Collection Time: 09/03/19 12:06 PM  Result Value Ref Range   Troponin I (High Sensitivity) 13 <18 ng/L    Comment: (NOTE) Elevated high sensitivity troponin I (hsTnI) values and significant  changes across serial measurements may suggest ACS but many other  chronic and acute conditions are known to elevate hsTnI results.  Refer to the "Links" section for chest pain algorithms  and additional  guidance. Performed at [facility], [address] [place], [place] [address]   Basic metabolic panel     Status: Abnormal   Collection Time: 09/04/19  7:54 AM  Result Value Ref Range   Sodium 135 135 - 145 mmol/L   Potassium 4.4 3.5 - 5.1 mmol/L    Comment: DELTA CHECK NOTED   Chloride 100 98 - 111 mmol/L   CO2 25 22 - 32 mmol/L   Glucose, Bld 92 70 - 99 mg/dL    Comment: Glucose reference range applies only  to samples taken after fasting for at least 8 hours.   BUN 11 8 - 23 mg/dL   Creatinine, Ser 1.51 (H) 0.44 - 1.00 mg/dL   Calcium 8.7 (L) 8.9 - 10.3 mg/dL   GFR calc non Af Amer 32 (L) >60 mL/min   GFR calc Af Amer 37 (L) >60 mL/min   Anion gap 10 5 - 15    Comment: Performed at [name], [address], [place], [place] [address]  CBC     Status: Abnormal   Collection Time: 09/04/19  7:54 AM  Result Value Ref Range   WBC 6.8 4.0 - 10.5 K/uL   RBC 3.01 (L) 3.87 - 5.11 MIL/uL   Hemoglobin 8.4 (L) 12.0 - 15.0 g/dL   HCT 27.3 (L) 36 - 46 %   MCV 90.7 80.0 - 100.0 fL   MCH 27.9 26.0 - 34.0 pg   MCHC 30.8 30.0 - 36.0 g/dL   RDW 14.6 11.5 - 15.5 %   Platelets 233 150 - 400 K/uL   nRBC 0.0 0.0 - 0.2 %    Comment: Performed at [facility], [address], [place], [place] [address]  Glucose, capillary     Status: None   Collection Time: 09/04/19  8:18 AM  Result Value Ref Range   Glucose-Capillary 89 70 - 99 mg/dL    Comment: Glucose reference range applies only to samples taken after fasting for at least 8 hours.  MRSA PCR Screening     Status: None   Collection Time: 09/04/19  3:21 PM   Specimen: Nasal Mucosa; Nasopharyngeal  Result Value Ref Range   MRSA by PCR NEGATIVE NEGATIVE    Comment:        The GeneXpert MRSA Assay (FDA approved for NASAL specimens only), is one component of a comprehensive MRSA colonization surveillance program. It is not intended to diagnose MRSA infection nor to guide or monitor treatment for MRSA  infections. Performed at [facility], [address] [place], [place] [address]   CBC     Status: Abnormal   Collection Time: 09/05/19  6:05 AM  Result Value Ref Range   WBC 7.1 4.0 - 10.5 K/uL   RBC 2.93 (L) 3.87 - 5.11 MIL/uL   Hemoglobin 8.2 (L) 12.0 - 15.0 g/dL   HCT 26.8 (L) 36 - 46 %   MCV 91.5 80.0 - 100.0 fL   MCH 28.0 26.0 - 34.0 pg   MCHC 30.6 30.0 - 36.0 g/dL   RDW 14.7 11.5 - 15.5 %   Platelets 238 150 - 400 K/uL   nRBC 0.0 0.0 - 0.2 %    Comment: Performed at [facility], [address] [place], [place] [address]  Basic metabolic panel     Status: Abnormal   Collection Time: 09/05/19  6:05 AM  Result Value Ref Range   Sodium 137 135 - 145 mmol/L   Potassium 4.1 3.5 - 5.1 mmol/L   Chloride 100 98 - 111 mmol/L   CO2 26 22 - 32 mmol/L   Glucose, Bld 110 (H) 70 - 99 mg/dL    Comment: Glucose reference range applies only to samples taken after fasting for at least 8 hours.   BUN 14 8 - 23 mg/dL   Creatinine, Ser 1.58 (H) 0.44 - 1.00 mg/dL   Calcium 9.1 8.9 - 10.3 mg/dL   GFR calc non Af Amer 31 (L) >60 mL/min   GFR calc Af Amer 35 (L) >60 mL/min   Anion gap  11 5 - 15    Comment: Performed at [facility], [address], [place], [place] [address]  CBC     Status: Abnormal   Collection Time: 09/06/19  8:01 AM  Result Value Ref Range   WBC 7.2 4.0 - 10.5 K/uL   RBC 2.94 (L) 3.87 - 5.11 MIL/uL   Hemoglobin 8.1 (L) 12.0 - 15.0 g/dL   HCT 26.7 (L) 36 - 46 %   MCV 90.8 80.0 - 100.0 fL   MCH 27.6 26.0 - 34.0 pg   MCHC 30.3 30.0 - 36.0 g/dL   RDW 14.8 11.5 - 15.5 %   Platelets 260 150 - 400 K/uL   nRBC 0.0 0.0 - 0.2 %    Comment: Performed at [facility], [address], [place], [place] [address]  Basic metabolic panel     Status: Abnormal   Collection Time: 09/06/19  8:01 AM  Result Value Ref Range   Sodium 131 (L) 135 - 145 mmol/L   Potassium 4.1 3.5 - 5.1 mmol/L   Chloride 98 98 - 111 mmol/L   CO2 20 (L) 22 - 32 mmol/L   Glucose, Bld 92 70 - 99 mg/dL     Comment: Glucose reference range applies only to samples taken after fasting for at least 8 hours.   BUN 16 8 - 23 mg/dL   Creatinine, Ser 1.66 (H) 0.44 - 1.00 mg/dL   Calcium 8.6 (L) 8.9 - 10.3 mg/dL   GFR calc non Af Amer 29 (L) >60 mL/min   GFR calc Af Amer 33 (L) >60 mL/min   Anion gap 13 5 - 15    Comment: Performed at [facility], [address] [place], [place] [address]  Surgical pathology     Status: None   Collection Time: 09/06/19 11:09 AM  Result Value Ref Range   SURGICAL PATHOLOGY      SURGICAL PATHOLOGY CASE: [id number] PATIENT: [name] Surgical Pathology Report     Clinical History: Dysphagia, gastrointestinal bleed     FINAL MICROSCOPIC DIAGNOSIS:  A. STOMACH, BIOPSY: - Gastric antral type mucosa showing mildly active chronic gastritis with atrophy, micronodular ECL cell hyperplasia and focal intestinal metaplasia.  See comment - Negative for dysplasia - Warthin-Starry stain is negative Helicobacter pylori  B. COLON, DESCENDING, POLYPECTOMY: - Tubular adenoma(s) without high-grade dysplasia or malignancy - Hyperplastic polyp(s) - Fragment of polypoid colonic mucosa with mucosal lipoma.  See comment  C. COLON, HEPATIC FLEXURE, POLYPECTOMY: - Tubular adenoma without high-grade dysplasia or malignancy - Hyperplastic polyp(s)  D. COLON, CECUM, POLYPECTOMY: - Tubular adenoma(s) without high-grade dysplasia or malignancy - Hyperplastic polyp(s)  E. COLON, SIGMOID, POLYPECTOMY: - Tubular adenoma(s) without high-grade dyspla sia or malignancy - Hyperplastic polyp   COMMENT:  A.  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  Clinical correlation is suggested.  B.  Though majority of mucosal lipomas are sporadic, some can be associated with Cowden syndrome (PTEN hamartoma tumor syndrome). Clinical correlation is suggested    GROSS DESCRIPTION:  A.  Received in formalin are tan, soft tissue fragments  that are submitted in toto. Number: 2 Size: 0.2 cm each Blocks: 1  B.  Received in formalin are tan, soft tissue fragments that are submitted in toto. Number: Multiple Size: 0.2-0.8 cm Blocks: 1, largest bisected  C.  Received in formalin are tan, soft tissue fragments that are submitted in toto. Number: Multiple Size: 0.2-0.8 cm blocks: 1, largest bisected  D.  Received in formalin are  tan, soft tissue fragments that are submitted in toto. Number: Multiple Size: 0.1-1 cm Blocks: 1, largest bisected  E.  Received in formalin are tan, soft tissue fragm ents that are submitted in toto. Number: Multiple Size: 0.1-0.4 cm Blocks: 1 (AK 09/07/2019)    Final Diagnosis performed by Jaquita Folds, MD.   Electronically signed 09/08/2019 Technical component performed at [facility], [place] [address], [place], [place] [address].  Professional component performed at [facility], [place] [address] [place], [place] [address].  Immunohistochemistry Technical component (if applicable) was performed at [facility]. [address] [place], [place], [place] [address].   IMMUNOHISTOCHEMISTRY DISCLAIMER (if applicable): Some of these immunohistochemical stains may have been developed and the performance characteristics determine by [facility]. Some may not have been cleared or approved by the U.S. Food and Drug Administration. The FDA has determined that such clearance or approval is not necessary. This test is used for clinical purposes. It should not be regarded a s investigational or for research. This laboratory is certified under the [place] (CLIA-88) as qualified to perform high complexity clinical laboratory testing.  The controls stained appropriately.   Basic metabolic panel     Status: Abnormal   Collection Time: 09/10/19  5:02 PM  Result Value Ref Range   Sodium 132 (L)  135 - 145 mmol/L   Potassium 3.6 3.5 - 5.1 mmol/L   Chloride 94 (L) 98 - 111 mmol/L   CO2 26 22 - 32 mmol/L   Glucose, Bld 105 (H) 70 - 99 mg/dL    Comment: Glucose reference range applies only to samples taken after fasting for at least 8 hours.   BUN 15 8 - 23 mg/dL   Creatinine, Ser 1.65 (H) 0.44 - 1.00 mg/dL   Calcium 8.5 (L) 8.9 - 10.3 mg/dL   GFR calc non Af Amer 29 (L) >60 mL/min   GFR calc Af Amer 34 (L) >60 mL/min   Anion gap 12 5 - 15    Comment: Performed at [facility], [address] [place], [place] [address]  CBC with Differential     Status: Abnormal   Collection Time: 09/10/19  5:02 PM  Result Value Ref Range   WBC 8.0 4.0 - 10.5 K/uL   RBC 3.09 (L) 3.87 - 5.11 MIL/uL   Hemoglobin 8.5 (L) 12.0 - 15.0 g/dL   HCT 27.3 (L) 36 - 46 %   MCV 88.3 80.0 - 100.0 fL   MCH 27.5 26.0 - 34.0 pg   MCHC 31.1 30.0 - 36.0 g/dL   RDW 15.0 11.5 - 15.5 %   Platelets 334 150 - 400 K/uL   nRBC 0.0 0.0 - 0.2 %   Neutrophils Relative % 77 %   Neutro Abs 6.1 1.7 - 7.7 K/uL   Lymphocytes Relative 11 %   Lymphs Abs 0.9 0.7 - 4.0 K/uL   Monocytes Relative 10 %   Monocytes Absolute 0.8 0 - 1 K/uL   Eosinophils Relative 2 %   Eosinophils Absolute 0.1 0 - 0 K/uL   Basophils Relative 0 %   Basophils Absolute 0.0 0 - 0 K/uL   Immature Granulocytes 0 %   Abs Immature Granulocytes 0.02 0.00 - 0.07 K/uL    Comment: Performed at [name], [address], [place], [place] [address]  Troponin I (High Sensitivity)     Status: None   Collection Time: 09/10/19  5:02 PM  Result  Value Ref Range   Troponin I (High Sensitivity) 12 <18 ng/L    Comment: (NOTE) Elevated high sensitivity troponin I (hsTnI) values and significant  changes across serial measurements may suggest ACS but many other  chronic and acute conditions are known to elevate hsTnI results.  Refer to the "Links" section for chest pain algorithms and additional  guidance. Performed at [name], [address]  [place], [place] [address]   Brain natriuretic peptide     Status: Abnormal   Collection Time: 09/10/19  5:02 PM  Result Value Ref Range   B Natriuretic Peptide 848.0 (H) 0.0 - 100.0 pg/mL    Comment: Performed at [name], [address], [place], [place] [address]  Basic metabolic panel     Status: Abnormal   Collection Time: 09/18/19 11:16 AM  Result Value Ref Range   Sodium 133 (L) 135 - 145 mmol/L   Potassium 2.9 (L) 3.5 - 5.1 mmol/L   Chloride 94 (L) 98 - 111 mmol/L   CO2 28 22 - 32 mmol/L   Glucose, Bld 97 70 - 99 mg/dL    Comment: Glucose reference range applies only to samples taken after fasting for at least 8 hours.   BUN 11 8 - 23 mg/dL   Creatinine, Ser 1.50 (H) 0.44 - 1.00 mg/dL   Calcium 8.4 (L) 8.9 - 10.3 mg/dL   GFR calc non Af Amer 33 (L) >60 mL/min   GFR calc Af Amer 38 (L) >60 mL/min   Anion gap 11 5 - 15    Comment: Performed at [facility], [address] [place], [place] [address]  Brain natriuretic peptide     Status: Abnormal   Collection Time: 09/18/19 11:16 AM  Result Value Ref Range   B Natriuretic Peptide 822.0 (H) 0.0 - 100.0 pg/mL    Comment: Performed at [facility], [address], [place], [place] [address]  Magnesium     Status: None   Collection Time: 09/18/19 11:16 AM  Result Value Ref Range   Magnesium 2.0 1.7 - 2.4 mg/dL    Comment: Performed at [facility], [address], [place], [place] [address]  Lactic acid, plasma     Status: None   Collection Time: 09/18/19 11:17 AM  Result Value Ref Range   Lactic Acid, Venous 1.2 0.5 - 1.9 mmol/L    Comment: Performed at [facility], [address], [place], [place] [address]  CBC with Differential     Status: Abnormal   Collection Time: 09/18/19 11:17 AM  Result Value Ref Range   WBC 8.6 4.0 - 10.5 K/uL   RBC 3.11 (L) 3.87 - 5.11 MIL/uL   Hemoglobin 8.3 (L) 12.0 - 15.0 g/dL   HCT 27.0 (L) 36 - 46 %   MCV 86.8 80.0 - 100.0 fL   MCH 26.7 26.0 - 34.0 pg   MCHC 30.7 30.0 - 36.0 g/dL   RDW  15.6 (H) 11.5 - 15.5 %   Platelets 297 150 - 400 K/uL   nRBC 0.0 0.0 - 0.2 %   Neutrophils Relative % 78 %   Neutro Abs 6.6 1.7 - 7.7 K/uL   Lymphocytes Relative 10 %   Lymphs Abs 0.9 0.7 - 4.0 K/uL   Monocytes Relative 9 %   Monocytes Absolute 0.8 0 - 1 K/uL   Eosinophils Relative 3 %   Eosinophils Absolute 0.3 0 - 0 K/uL   Basophils Relative 0 %   Basophils Absolute 0.0 0 - 0  K/uL   Immature Granulocytes 0 %   Abs Immature Granulocytes 0.03 0.00 - 0.07 K/uL    Comment: Performed at [facility], [address] [place], [place] [address]  SARS Coronavirus 2 by RT PCR (hospital order, performed in [name] hospital lab) Nasopharyngeal Nasopharyngeal Swab     Status: None   Collection Time: 09/18/19  1:27 PM   Specimen: Nasopharyngeal Swab  Result Value Ref Range   SARS Coronavirus 2 NEGATIVE NEGATIVE    Comment: (NOTE) SARS-CoV-2 target nucleic acids are NOT DETECTED.  The SARS-CoV-2 RNA is generally detectable in upper and lower respiratory specimens during the acute phase of infection. The lowest concentration of SARS-CoV-2 viral copies this assay can detect is 250 copies / mL. A negative result does not preclude SARS-CoV-2 infection and should not be used as the sole basis for treatment or other patient management decisions.  A negative result may occur with improper specimen collection / handling, submission of specimen other than nasopharyngeal swab, presence of viral mutation(s) within the areas targeted by this assay, and inadequate number of viral copies (<250 copies / mL). A negative result must be combined with clinical observations, patient history, and epidemiological information.  Fact Sheet for Patients:   StrictlyIdeas.no  Fact Sheet for Healthcare Providers: BankingDealers.co.za  This test is not yet approved or  cleared by the [place] FDA and has been authorized for detection and/or diagnosis of  SARS-CoV-2 by FDA under an Emergency Use Authorization (EUA).  This EUA will remain in effect (meaning this test can be used) for the duration of the COVID-19 declaration under Section 564(b)(1) of the Act, 21 U.S.C. section 360bbb-3(b)(1), unless the authorization is terminated or revoked sooner.  Performed at [facility], [address] [place], [place] [address]   ECHOCARDIOGRAM COMPLETE     Status: None   Collection Time: 09/18/19  2:50 PM  Result Value Ref Range   Weight 2,320 oz   Height 64 in   BP 105/64 mmHg  Magnesium     Status: None   Collection Time: 09/18/19  4:24 PM  Result Value Ref Range   Magnesium 2.2 1.7 - 2.4 mg/dL    Comment: Performed at [facility], [address], [place], [place] [address]  TSH     Status: None   Collection Time: 09/18/19  4:24 PM  Result Value Ref Range   TSH 2.037 0.350 - 4.500 uIU/mL    Comment: Performed by a 3rd Generation assay with a functional sensitivity of <=0.01 uIU/mL. Performed at [name], [address], [place], [place] [address]   Basic metabolic panel     Status: Abnormal   Collection Time: 09/19/19  4:51 AM  Result Value Ref Range   Sodium 135 135 - 145 mmol/L   Potassium 3.8 3.5 - 5.1 mmol/L    Comment: DELTA CHECK NOTED   Chloride 97 (L) 98 - 111 mmol/L   CO2 26 22 - 32 mmol/L   Glucose, Bld 78 70 - 99 mg/dL    Comment: Glucose reference range applies only to samples taken after fasting for at least 8 hours.   BUN 12 8 - 23 mg/dL   Creatinine, Ser 1.49 (H) 0.44 - 1.00 mg/dL   Calcium 8.7 (L) 8.9 - 10.3 mg/dL   GFR calc non Af Amer 33 (L) >60 mL/min   GFR calc Af Amer 38 (L) >60 mL/min   Anion gap 12 5 - 15    Comment: Performed at [facility]  [address] [place], [place] [address]  Basic metabolic panel     Status: Abnormal   Collection Time: 09/20/19  4:48 AM  Result Value Ref Range   Sodium 134 (L) 135 - 145 mmol/L   Potassium 3.8 3.5 - 5.1 mmol/L   Chloride 95 (L) 98 - 111 mmol/L   CO2 27 22 -  32 mmol/L   Glucose, Bld 83 70 - 99 mg/dL    Comment: Glucose reference range applies only to samples taken after fasting for at least 8 hours.   BUN 14 8 - 23 mg/dL   Creatinine, Ser 1.61 (H) 0.44 - 1.00 mg/dL   Calcium 8.9 8.9 - 10.3 mg/dL   GFR calc non Af Amer 30 (L) >60 mL/min   GFR calc Af Amer 35 (L) >60 mL/min   Anion gap 12 5 - 15    Comment: Performed at [facility], [address] [place], [place] [address]  Basic metabolic panel     Status: Abnormal   Collection Time: 09/21/19  5:09 AM  Result Value Ref Range   Sodium 133 (L) 135 - 145 mmol/L   Potassium 3.8 3.5 - 5.1 mmol/L   Chloride 94 (L) 98 - 111 mmol/L   CO2 27 22 - 32 mmol/L   Glucose, Bld 103 (H) 70 - 99 mg/dL    Comment: Glucose reference range applies only to samples taken after fasting for at least 8 hours.   BUN 17 8 - 23 mg/dL   Creatinine, Ser 1.94 (H) 0.44 - 1.00 mg/dL   Calcium 8.8 (L) 8.9 - 10.3 mg/dL   GFR calc non Af Amer 24 (L) >60 mL/min   GFR calc Af Amer 28 (L) >60 mL/min   Anion gap 12 5 - 15    Comment: Performed at [facility], [address] [place], [place] [address]  Basic metabolic panel     Status: Abnormal   Collection Time: 09/22/19  4:55 AM  Result Value Ref Range   Sodium 131 (L) 135 - 145 mmol/L   Potassium 3.8 3.5 - 5.1 mmol/L   Chloride 94 (L) 98 - 111 mmol/L   CO2 25 22 - 32 mmol/L   Glucose, Bld 112 (H) 70 - 99 mg/dL    Comment: Glucose reference range applies only to samples taken after fasting for at least 8 hours.   BUN 20 8 - 23 mg/dL   Creatinine, Ser 1.97 (H) 0.44 - 1.00 mg/dL   Calcium 8.8 (L) 8.9 - 10.3 mg/dL   GFR calc non Af Amer 23 (L) >60 mL/min   GFR calc Af Amer 27 (L) >60 mL/min   Anion gap 12 5 - 15    Comment: Performed at [facility], [address] [place], [place] [address]  Basic metabolic panel     Status: Abnormal   Collection Time: 09/23/19  5:14 AM  Result Value Ref Range   Sodium 129 (L) 135 - 145 mmol/L   Potassium 4.1 3.5 - 5.1 mmol/L    Chloride 92 (L) 98 - 111 mmol/L   CO2 24 22 - 32 mmol/L   Glucose, Bld 106 (H) 70 - 99 mg/dL    Comment: Glucose reference range applies only to samples taken after fasting for at least 8 hours.   BUN 25 (H) 8 - 23 mg/dL   Creatinine, Ser 2.20 (H) 0.44 - 1.00 mg/dL   Calcium 8.9 8.9 - 10.3 mg/dL   GFR calc non Af Amer 20 (L) >  60 mL/min   GFR calc Af Amer 24 (L) >60 mL/min   Anion gap 13 5 - 15    Comment: Performed at [name], [address], [place], [place] [address]  Basic metabolic panel     Status: Abnormal   Collection Time: 09/24/19  5:17 AM  Result Value Ref Range   Sodium 132 (L) 135 - 145 mmol/L   Potassium 4.5 3.5 - 5.1 mmol/L   Chloride 96 (L) 98 - 111 mmol/L   CO2 24 22 - 32 mmol/L   Glucose, Bld 87 70 - 99 mg/dL    Comment: Glucose reference range applies only to samples taken after fasting for at least 8 hours.   BUN 29 (H) 8 - 23 mg/dL   Creatinine, Ser 2.35 (H) 0.44 - 1.00 mg/dL   Calcium 8.9 8.9 - 10.3 mg/dL   GFR calc non Af Amer 19 (L) >60 mL/min   GFR calc Af Amer 22 (L) >60 mL/min   Anion gap 12 5 - 15    Comment: Performed at [name], [address], [place], [place] [address]  Magnesium     Status: None   Collection Time: 09/30/19  8:07 AM  Result Value Ref Range   Magnesium 2.1 1.5 - 2.5 mg/dL  Basic metabolic panel     Status: Abnormal   Collection Time: 09/30/19  8:07 AM  Result Value Ref Range   Glucose, Bld 116 65 - 139 mg/dL    Comment: .        Non-fasting reference interval .    BUN 27 (H) 7 - 25 mg/dL   Creat 2.15 (H) 0.60 - 0.88 mg/dL    Comment: For patients >35 years of age, the reference limit for Creatinine is approximately 13% higher for people identified as African-American. .    BUN/Creatinine Ratio 13 6 - 22 (calc)   Sodium 136 135 - 146 mmol/L   Potassium 4.0 3.5 - 5.3 mmol/L   Chloride 99 98 - 110 mmol/L   CO2 27 20 - 32 mmol/L   Calcium 8.9 8.6 - 10.4 mg/dL  HIV Antibody (routine testing w rflx)     Status: None    Collection Time: 10/08/19  1:33 PM  Result Value Ref Range   HIV Screen 4th Generation wRfx Non Reactive Non Reactive    Comment: Performed at [facility], [place] [address] [place], [place] [address]  Creatinine clearance, urine, 24 hour     Status: Abnormal   Collection Time: 10/08/19  1:50 PM  Result Value Ref Range   Urine Total Volume-CRCL 500 mL   Collection Interval-CRCL 24 hours   Creatinine, Urine 106.81 mg/dL   Creatinine, 24H Ur 534 (L) 600 - 1,800 mg/day   Creatinine Clearance 15 (L) 75 - 115 mL/min    Comment: Performed at [facility], [address], [place], [place] [address]  Protein, urine, 24 hour     Status: None   Collection Time: 10/08/19  1:50 PM  Result Value Ref Range   Urine Total Volume-UPROT 500 mL   Collection Interval-UPROT 24 hours   Protein, Urine 18 mg/dL   Protein, 24H Urine NOT CALCULATED 50 - 100 mg/day    Comment: Performed at [facility], [address], [place], [place] [address]  Protein electrophoresis, serum     Status: None   Collection Time: 10/08/19  1:54 PM  Result Value Ref Range   Total Protein ELP 7.1 6.0 - 8.5 g/dL  Albumin ELP 3.5 2.9 - 4.4 g/dL   Alpha-1-Globulin 0.3 0.0 - 0.4 g/dL   Alpha-2-Globulin 0.7 0.4 - 1.0 g/dL   Beta Globulin 1.1 0.7 - 1.3 g/dL   Gamma Globulin 1.5 0.4 - 1.8 g/dL   M-Spike, % Not Observed Not Observed g/dL   SPE Interp. Comment     Comment: (NOTE) The SPE pattern appears unremarkable. Evidence of monoclonal protein is not apparent. Performed At: [facility] [place], [place] [id number] Rush Farmer MD [id number]    Comment Comment     Comment: (NOTE) Protein electrophoresis scan will follow via computer, mail, or courier delivery.    Globulin, Total 3.6 2.2 - 3.9 g/dL   A/G Ratio 1.0 0.7 - 1.7  ANCA titers     Status: None   Collection Time: 10/08/19  1:54 PM  Result Value Ref Range   C-ANCA <1:20 Neg:<1:20 titer   P-ANCA <1:20 Neg:<1:20 titer     Comment: (NOTE) The presence of positive fluorescence exhibiting P-ANCA or C-ANCA patterns alone is not specific for the diagnosis of Wegener's Granulomatosis (WG) or microscopic polyangiitis. Decisions about treatment should not be based solely on ANCA IFA results.  The International ANCA Group Consensus recommends follow up testing of positive sera with both PR-3 and MPO-ANCA enzyme immunoassays. As many as 5% serum samples are positive only by EIA. Ref. AM J Clin Pathol 1999;111:507-513.    Atypical P-ANCA titer <1:20 Neg:<1:20 titer    Comment: (NOTE) The atypical pANCA pattern has been observed in a significant percentage of patients with ulcerative colitis, primary sclerosing cholangitis and autoimmune hepatitis. Performed At: [facility] [place], [place] [id number] Rush Farmer MD [id number]   CBC with Differential/Platelet     Status: Abnormal   Collection Time: 10/08/19  1:54 PM  Result Value Ref Range   WBC 5.4 4.0 - 10.5 K/uL   RBC 3.10 (L) 3.87 - 5.11 MIL/uL   Hemoglobin 8.0 (L) 12.0 - 15.0 g/dL   HCT 26.2 (L) 36 - 46 %   MCV 84.5 80.0 - 100.0 fL   MCH 25.8 (L) 26.0 - 34.0 pg   MCHC 30.5 30.0 - 36.0 g/dL   RDW 16.9 (H) 11.5 - 15.5 %   Platelets 244 150 - 400 K/uL   nRBC 0.0 0.0 - 0.2 %   Neutrophils Relative % 72 %   Neutro Abs 3.9 1.7 - 7.7 K/uL   Lymphocytes Relative 16 %   Lymphs Abs 0.8 0.7 - 4.0 K/uL   Monocytes Relative 8 %   Monocytes Absolute 0.4 0 - 1 K/uL   Eosinophils Relative 3 %   Eosinophils Absolute 0.2 0 - 0 K/uL   Basophils Relative 1 %   Basophils Absolute 0.0 0 - 0 K/uL   Immature Granulocytes 0 %   Abs Immature Granulocytes 0.02 0.00 - 0.07 K/uL    Comment: Performed at [facility], [address], [place], [place] [address]  Comprehensive metabolic panel     Status: Abnormal   Collection Time: 10/08/19  1:54 PM  Result Value Ref Range   Sodium 132 (L) 135 - 145 mmol/L   Potassium 3.6 3.5 - 5.1 mmol/L    Chloride 96 (L) 98 - 111 mmol/L   CO2 25 22 - 32 mmol/L   Glucose, Bld 101 (H) 70 - 99 mg/dL    Comment: Glucose reference range applies only to samples taken after fasting for at least 8 hours.   BUN 23 8 -  23 mg/dL   Creatinine, Ser 2.47 (H) 0.44 - 1.00 mg/dL   Calcium 8.6 (L) 8.9 - 10.3 mg/dL   Total Protein 7.5 6.5 - 8.1 g/dL   Albumin 3.8 3.5 - 5.0 g/dL   AST 22 15 - 41 U/L   ALT 13 0 - 44 U/L   Alkaline Phosphatase 99 38 - 126 U/L   Total Bilirubin 1.1 0.3 - 1.2 mg/dL   GFR calc non Af Amer 18 (L) >60 mL/min   GFR calc Af Amer 21 (L) >60 mL/min   Anion gap 11 5 - 15    Comment: Performed at [facility], [address] [place], [place] [address]  Ferritin     Status: Abnormal   Collection Time: 10/08/19  1:54 PM  Result Value Ref Range   Ferritin 10 (L) 11 - 307 ng/mL    Comment: Performed at [name], [address] [place], [place] [address]  Hemoglobin A1c     Status: None   Collection Time: 10/08/19  1:54 PM  Result Value Ref Range   Hgb A1c MFr Bld 5.4 4.8 - 5.6 %    Comment: (NOTE) Pre diabetes:          5.7%-6.4%  Diabetes:              >6.4%  Glycemic control for   <7.0% adults with diabetes    Mean Plasma Glucose 108.28 mg/dL    Comment: Performed at [facility], [place] [address] [place], [place] [address]  Iron and TIBC     Status: Abnormal   Collection Time: 10/08/19  1:54 PM  Result Value Ref Range   Iron 20 (L) 28 - 170 ug/dL   TIBC 458 (H) 250 - 450 ug/dL   Saturation Ratios 4 (L) 10.4 - 31.8 %   UIBC 438 ug/dL    Comment: Performed at [facility], [address] [place], [place] [address]  Magnesium     Status: None   Collection Time: 10/08/19  1:54 PM  Result Value Ref Range   Magnesium 2.2 1.7 - 2.4 mg/dL    Comment: Performed at [facility], [address] [place], [place] [address]  Phosphorus     Status: None   Collection Time: 10/08/19  1:54 PM  Result Value Ref Range   Phosphorus 4.6 2.5 - 4.6 mg/dL    Comment: Performed at  [facility], [address], [place], [place] [address]  PTH, intact and calcium     Status: Abnormal   Collection Time: 10/08/19  1:54 PM  Result Value Ref Range   PTH 109 (H) 15 - 65 pg/mL   Calcium, Total (PTH) 9.0 8.7 - 10.3 mg/dL   PTH Interp Comment     Comment: (NOTE) Interpretation                 Intact PTH    Calcium                                (pg/mL)      (mg/dL) Normal                          15 - 65     8.6 - 10.2 Primary Hyperparathyroidism         >65          >10.2 Secondary Hyperparathyroidism       >  65          <10.2 Non-Parathyroid Hypercalcemia       <65          >10.2 Hypoparathyroidism                  <15          < 8.6 Non-Parathyroid Hypocalcemia    15 - 65          < 8.6 Performed At: [facility] [place], [place] [id number] Rush Farmer MD [id number]   Uric acid     Status: Abnormal   Collection Time: 10/08/19  1:54 PM  Result Value Ref Range   Uric Acid, Serum 10.2 (H) 2.5 - 7.1 mg/dL    Comment: Performed at [facility], [address], [place], [place] [address]  Protein / creatinine ratio, urine     Status: Abnormal   Collection Time: 10/08/19  1:54 PM  Result Value Ref Range   Creatinine, Urine 106.49 mg/dL   Total Protein, Urine 19 mg/dL    Comment: NO NORMAL RANGE ESTABLISHED FOR THIS TEST   Protein Creatinine Ratio 0.18 (H) 0.00 - 0.15 mg/mg[Cre]    Comment: Performed at [facility], [address] [place], [place] [address]  VITAMIN D 25 Hydroxy (Vit-D Deficiency, Fractures)     Status: None   Collection Time: 10/08/19  1:54 PM  Result Value Ref Range   Vit D, 25-Hydroxy 31.94 30 - 100 ng/mL    Comment: (NOTE) Vitamin D deficiency has been defined by the [place] practice guideline as a level of serum 25-OH  vitamin D less than 20 ng/mL (1,2). The Endocrine Society went on to  further define vitamin D insufficiency as a level between 21 and 29  ng/mL (2).  1. IOM  (Institute of Medicine). 2010. Dietary reference intakes for  calcium and D. [place]: The [employer]. 2. Holick MF, Binkley Redfield, Bischoff-Ferrari HA, et al. Evaluation,  treatment, and prevention of vitamin D deficiency: an Endocrine  Society clinical practice guideline, JCEM. 2011 Jul; 96(7): 1911-30.  Performed at [facility], [place] [address], [place], [place] [address]   Vitamin B12     Status: None   Collection Time: 10/08/19  1:54 PM  Result Value Ref Range   Vitamin B-12 312 180 - 914 pg/mL    Comment: (NOTE) This assay is not validated for testing neonatal or myeloproliferative syndrome specimens for Vitamin B12 levels. Performed at [facility], [address] [place], [place] [address]   Protein electrophoresis, serum     Status: Abnormal   Collection Time: 10/08/19  1:54 PM  Result Value Ref Range   Total Protein ELP 7.1 6.0 - 8.5 g/dL   Albumin ELP 3.4 2.9 - 4.4 g/dL   Alpha-1-Globulin 0.3 0.0 - 0.4 g/dL   Alpha-2-Globulin 0.7 0.4 - 1.0 g/dL   Beta Globulin 1.1 0.7 - 1.3 g/dL   Gamma Globulin 1.6 0.4 - 1.8 g/dL   M-Spike, % 0.3 (H) Not Observed g/dL   SPE Interp. Comment     Comment: (NOTE) Faint band in gamma region suspicious for monoclonal immunoglobulin. This band may represent a benign spike as seen in older people or could be a paraprotein as seen in Multiple Myeloma, Waldenstrom's Macroglobulinemia or Lymphoma. Depending on clinical circumstances, further diagnostic studies may include serum immunofixation or serum free light chain quantitation. Performed At: [facility] [address] [place], [place] [id number] Rush Farmer MD [id number]    Comment Comment     Comment: (NOTE) Protein electrophoresis scan will follow via computer, mail, or courier delivery.    Globulin, Total 3.7 2.2 - 3.9 g/dL   A/G Ratio 0.9 0.7 - 1.7  Kappa/lambda light chains     Status: Abnormal   Collection Time: 10/08/19  1:54 PM  Result  Value Ref Range   Kappa free light chain 118.0 (H) 3.3 - 19.4 mg/L   Lamda free light chains 73.4 (H) 5.7 - 26.3 mg/L   Kappa, lamda light chain ratio 1.61 0.26 - 1.65    Comment: (NOTE) Performed At: [facility] [place], [place] [id number] Rush Farmer MD [id number]   Hepatitis C antibody     Status: None   Collection Time: 10/08/19  1:54 PM  Result Value Ref Range   HCV Ab NON REACTIVE NON REACTIVE    Comment: (NOTE) Nonreactive HCV antibody screen is consistent with no HCV infections,  unless recent infection is suspected or other evidence exists to indicate HCV infection.  Performed at [facility], [place] [address] [place], [place] [address]   Hepatitis B surface antigen     Status: None   Collection Time: 10/08/19  1:54 PM  Result Value Ref Range   Hepatitis B Surface Ag NON REACTIVE NON REACTIVE    Comment: Performed at [place] [address], [place], [place] [address]  Hepatitis B surface antibody     Status: Abnormal   Collection Time: 10/08/19  1:54 PM  Result Value Ref Range   Hepatitis B-Post <3.1 (L) Immunity>9.9 mIU/mL    Comment: (NOTE)  Status of Immunity                     Anti-HBs Level  ------------------                     -------------- Inconsistent with Immunity                   0.0 - 9.9 Consistent with Immunity                          >9.9 Performed At: [facility] [place], [place] [id number] Rush Farmer MD [id number]   Hepatitis C genotype     Status: None   Collection Time: 10/08/19  1:54 PM  Result Value Ref Range   HCV Genotype Comment     Comment: (NOTE) Specimen has insufficient hepatitis C virus RNA to obtain genotyping results. This genotyping assay should only be used for known HCV positive patients with HCV RNA levels above 1000 IU/mL.    Please Note (HCV): Comment     Comment: (NOTE) This test was developed and its performance  characteristics determined by [facility].  It has not been cleared or approved by the U.S. Food and Drug Administration. The FDA has determined that such clearance or approval is not necessary. This test is used for clinical purposes.  It should not be regarded as investigational or for research. Performed At: [facility] [place], [place] [id number] Rush Farmer MD [id number]   C3 complement     Status: None   Collection Time: 10/08/19  1:54 PM  Result Value Ref Range   C3 Complement 100 82 - 167 mg/dL    Comment: (NOTE) Performed At: [place], [place] [id number] Rush Farmer MD [id number]   C4 complement     Status: None   Collection Time: 10/08/19  1:54 PM  Result Value Ref Range   Complement C4, Body Fluid 16 12 - 38 mg/dL    Comment: (NOTE) Performed At: [facility] [place], [place] [id number] Rush Farmer MD [id number]   Glomerular basement membrane antibodies     Status: None   Collection Time: 10/08/19  1:54 PM  Result Value Ref Range   GBM Ab 3 0 - 20 units    Comment: (NOTE)                   Negative                   0 - 20                   Weak Positive             21 - 30                   Moderate to Strong Positive   >30 Performed At: [facility] [place], [place] [id number] Rush Farmer MD [id number]   Miscellaneous [facility] test (send-out)     Status: None   Collection Time: 10/08/19  1:54 PM  Result Value Ref Range   [facility] test code PHOSPHOLIPASE A2 RECEPTOR ANTIBODIES    [facility] test name [phone]     Comment: Performed at [facility], [address], [place], [place] [address]   [place] result COMMENT     Comment: (NOTE) Performed At: [facility] [place], [place] [id number] Rush Farmer MD [id number]   Miscellaneous [facility] test (send-out)     Status: None   Collection Time: 10/08/19  1:54  PM  Result Value Ref Range   [facility] test code [id number]     Comment: CORRECTED ON 08/02 AT 1253: PREVIOUSLY REPORTED AS ANA 12 PLUS PROFILE    [facility] test name ANA 12 PLUS PROFILE     Comment: Performed at [facility], [place] [address] [place], [place] [address] CORRECTED ON 08/02 AT 1253: PREVIOUSLY REPORTED AS [id number]    Misc [facility] result COMMENT     Comment: (NOTE) Performed At: [name] [place], [place] [id number] Rush Farmer MD [id number]   Basic metabolic panel     Status: Abnormal   Collection Time: 10/10/19  9:07 AM  Result Value Ref Range   Sodium 134 (L) 135 - 145 mmol/L   Potassium 3.5 3.5 - 5.1 mmol/L   Chloride 99 98 - 111 mmol/L   CO2 25 22 - 32 mmol/L   Glucose, Bld 92 70 - 99 mg/dL    Comment: Glucose reference range applies only to samples taken after fasting for at least 8 hours.   BUN 27 (H) 8 - 23 mg/dL   Creatinine, Ser 2.58 (H) 0.44 - 1.00 mg/dL   Calcium 8.6 (L) 8.9 - 10.3 mg/dL   GFR calc non Af Amer 17 (L) >60 mL/min   GFR calc Af Amer 20 (L) >60 mL/min   Anion gap 10 5 - 15    Comment: Performed at [facility], [address] [place], [place] [address]  Brain natriuretic peptide     Status: Abnormal   Collection Time: 10/10/19  9:07 AM  Result Value Ref Range   B Natriuretic  Peptide 1,768.0 (H) 0.0 - 100.0 pg/mL    Comment: Performed at [facility], [address] [place], [place] [address]  CBC with Differential/Platelet     Status: Abnormal   Collection Time: 10/10/19  9:07 AM  Result Value Ref Range   WBC 5.2 4.0 - 10.5 K/uL   RBC 3.13 (L) 3.87 - 5.11 MIL/uL   Hemoglobin 7.9 (L) 12.0 - 15.0 g/dL   HCT 26.0 (L) 36 - 46 %   MCV 83.1 80.0 - 100.0 fL   MCH 25.2 (L) 26.0 - 34.0 pg   MCHC 30.4 30.0 - 36.0 g/dL   RDW 17.0 (H) 11.5 - 15.5 %   Platelets 223 150 - 400 K/uL   nRBC 0.0 0.0 - 0.2 %   Neutrophils Relative % 69 %   Neutro Abs 3.6 1.7 - 7.7 K/uL   Lymphocytes Relative 18 %   Lymphs Abs 1.0 0.7 -  4.0 K/uL   Monocytes Relative 8 %   Monocytes Absolute 0.4 0 - 1 K/uL   Eosinophils Relative 4 %   Eosinophils Absolute 0.2 0 - 0 K/uL   Basophils Relative 1 %   Basophils Absolute 0.0 0 - 0 K/uL   Immature Granulocytes 0 %   Abs Immature Granulocytes 0.01 0.00 - 0.07 K/uL    Comment: Performed at [facility], [address], [place], [place] [address]  Troponin I (High Sensitivity)     Status: None   Collection Time: 10/10/19  9:07 AM  Result Value Ref Range   Troponin I (High Sensitivity) 13 <18 ng/L    Comment: (NOTE) Elevated high sensitivity troponin I (hsTnI) values and significant  changes across serial measurements may suggest ACS but many other  chronic and acute conditions are known to elevate hsTnI results.  Refer to the "Links" section for chest pain algorithms and additional  guidance. Performed at [facility], [address] [place], [place] [address]   Comprehensive metabolic panel     Status: Abnormal   Collection Time: 10/29/19  5:07 PM  Result Value Ref Range   Sodium 137 135 - 145 mmol/L   Potassium 3.0 (L) 3.5 - 5.1 mmol/L   Chloride 97 (L) 98 - 111 mmol/L   CO2 24 22 - 32 mmol/L   Glucose, Bld 84 70 - 99 mg/dL    Comment: Glucose reference range applies only to samples taken after fasting for at least 8 hours.   BUN 27 (H) 8 - 23 mg/dL   Creatinine, Ser 2.80 (H) 0.44 - 1.00 mg/dL   Calcium 8.5 (L) 8.9 - 10.3 mg/dL   Total Protein 7.3 6.5 - 8.1 g/dL   Albumin 3.5 3.5 - 5.0 g/dL   AST 24 15 - 41 U/L   ALT 9 0 - 44 U/L   Alkaline Phosphatase 89 38 - 126 U/L   Total Bilirubin 1.8 (H) 0.3 - 1.2 mg/dL   GFR calc non Af Amer 15 (L) >60 mL/min   GFR calc Af Amer 18 (L) >60 mL/min   Anion gap 16 (H) 5 - 15    Comment: Performed at [facility], [address] [place], [place] [address]  Brain natriuretic peptide     Status: Abnormal   Collection Time: 10/29/19  5:07 PM  Result Value Ref Range   B Natriuretic Peptide 1,507.0 (H) 0.0 - 100.0 pg/mL     Comment: Performed at [facility], [address], [place], [place] [address]  Troponin I (High Sensitivity)  Status: Abnormal   Collection Time: 10/29/19  5:07 PM  Result Value Ref Range   Troponin I (High Sensitivity) 18 (H) <18 ng/L    Comment: (NOTE) Elevated high sensitivity troponin I (hsTnI) values and significant  changes across serial measurements may suggest ACS but many other  chronic and acute conditions are known to elevate hsTnI results.  Refer to the "Links" section for chest pain algorithms and additional  guidance. Performed at [facility], [address], [place], [place] [address]   CBC with Differential     Status: Abnormal   Collection Time: 10/29/19  5:07 PM  Result Value Ref Range   WBC 9.8 4.0 - 10.5 K/uL   RBC 2.58 (L) 3.87 - 5.11 MIL/uL   Hemoglobin 6.4 (LL) 12.0 - 15.0 g/dL    Comment: REPEATED TO VERIFY THIS CRITICAL RESULT HAS VERIFIED AND BEEN CALLED TO FARMER,E BY BOBBIE MATTHEWS ON 08 05 2021 AT 1830, AND HAS BEEN READ BACK.     HCT 21.7 (L) 36 - 46 %   MCV 84.1 80.0 - 100.0 fL   MCH 24.8 (L) 26.0 - 34.0 pg   MCHC 29.5 (L) 30.0 - 36.0 g/dL   RDW 19.3 (H) 11.5 - 15.5 %   Platelets 198 150 - 400 K/uL   nRBC 0.0 0.0 - 0.2 %   Neutrophils Relative % 88 %   Neutro Abs 8.6 (H) 1.7 - 7.7 K/uL   Lymphocytes Relative 5 %   Lymphs Abs 0.5 (L) 0.7 - 4.0 K/uL   Monocytes Relative 6 %   Monocytes Absolute 0.6 0 - 1 K/uL   Eosinophils Relative 1 %   Eosinophils Absolute 0.1 0 - 0 K/uL   Basophils Relative 0 %   Basophils Absolute 0.0 0 - 0 K/uL   Immature Granulocytes 0 %   Abs Immature Granulocytes 0.04 0.00 - 0.07 K/uL    Comment: Performed at [name], [address], [place], [place] [address]  SARS Coronavirus 2 by RT PCR (hospital order, performed in [facility] hospital lab) Nasopharyngeal Nasopharyngeal Swab     Status: None   Collection Time: 10/29/19  5:26 PM   Specimen: Nasopharyngeal Swab  Result Value Ref Range   SARS Coronavirus 2  NEGATIVE NEGATIVE    Comment: (NOTE) SARS-CoV-2 target nucleic acids are NOT DETECTED.  The SARS-CoV-2 RNA is generally detectable in upper and lower respiratory specimens during the acute phase of infection. The lowest concentration of SARS-CoV-2 viral copies this assay can detect is 250 copies / mL. A negative result does not preclude SARS-CoV-2 infection and should not be used as the sole basis for treatment or other patient management decisions.  A negative result may occur with improper specimen collection / handling, submission of specimen other than nasopharyngeal swab, presence of viral mutation(s) within the areas targeted by this assay, and inadequate number of viral copies (<250 copies / mL). A negative result must be combined with clinical observations, patient history, and epidemiological information.  Fact Sheet for Patients:   StrictlyIdeas.no  Fact Sheet for Healthcare Providers: BankingDealers.co.za  This test is not yet approved or  cleared by the [place] FDA and has been authorized for detection and/or diagnosis of SARS-CoV-2 by FDA under an Emergency Use Authorization (EUA).  This EUA will remain in effect (meaning this test can be used) for the duration of the COVID-19 declaration under Section 564(b)(1) of the Act, 21 U.S.C. section 360bbb-3(b)(1), unless the authorization is terminated or revoked sooner.  Performed at [name], [address], [place], [place] [address]   Lactic acid, plasma     Status: Abnormal   Collection Time: 10/29/19  5:26 PM  Result Value Ref Range   Lactic Acid, Venous 3.7 (HH) 0.5 - 1.9 mmol/L    Comment: CRITICAL RESULT CALLED TO, READ BACK BY AND VERIFIED WITH: WILEY,E ON 10/29/19 AT 1840 BY LOY,C Performed at [facility], [address], [place], [place] [address]   Blood Culture (routine x 2)     Status: None   Collection Time: 10/29/19  5:26 PM   Specimen: BLOOD LEFT  FOREARM  Result Value Ref Range   Specimen Description BLOOD LEFT FOREARM    Special Requests      BOTTLES DRAWN AEROBIC AND ANAEROBIC Blood Culture adequate volume   Culture      NO GROWTH 5 DAYS Performed at [facility], [address], [place], [place] [address]    Report Status 11/03/2019 FINAL   D-dimer, quantitative     Status: Abnormal   Collection Time: 10/29/19  5:26 PM  Result Value Ref Range   D-Dimer, Quant 2.70 (H) 0.00 - 0.50 ug/mL-FEU    Comment: (NOTE) At the manufacturer cut-off of 0.50 ug/mL FEU, this assay has been documented to exclude PE with a sensitivity and negative predictive value of 97 to 99%.  At this time, this assay has not been approved by the FDA to exclude DVT/VTE. Results should be correlated with clinical presentation. Performed at [facility], [address], [place], [place] [address]   Procalcitonin     Status: None   Collection Time: 10/29/19  5:26 PM  Result Value Ref Range   Procalcitonin <0.10 ng/mL    Comment:        Interpretation: PCT (Procalcitonin) <= 0.5 ng/mL: Systemic infection (sepsis) is not likely. Local bacterial infection is possible. (NOTE)       Sepsis PCT Algorithm           Lower Respiratory Tract                                      Infection PCT Algorithm    ----------------------------     ----------------------------         PCT < 0.25 ng/mL                PCT < 0.10 ng/mL          Strongly encourage             Strongly discourage   discontinuation of antibiotics    initiation of antibiotics    ----------------------------     -----------------------------       PCT 0.25 - 0.50 ng/mL            PCT 0.10 - 0.25 ng/mL               OR       >80% decrease in PCT            Discourage initiation of                                            antibiotics      Encourage discontinuation           of antibiotics    ----------------------------     -----------------------------  PCT >= 0.50 ng/mL               PCT 0.26 - 0.50 ng/mL               AND        <80% decrease in PCT             Encourage initiation of                                             antibiotics       Encourage continuation           of antibiotics    ----------------------------     -----------------------------        PCT >= 0.50 ng/mL                  PCT > 0.50 ng/mL               AND         increase in PCT                  Strongly encourage                                      initiation of antibiotics    Strongly encourage escalation           of antibiotics                                     -----------------------------                                           PCT <= 0.25 ng/mL                                                 OR                                        > 80% decrease in PCT                                      Discontinue / Do not initiate                                             antibiotics  Performed at [facility], [address], [place], [place] [address]   Lactate dehydrogenase     Status: None   Collection Time: 10/29/19  5:26 PM  Result Value Ref Range   LDH 161 98 - 192 U/L    Comment: Performed at [facility], [address] [place],  [address]  Ferritin  Status: Abnormal   Collection Time: 10/29/19  5:26 PM  Result Value Ref Range   Ferritin 9 (L) 11 - 307 ng/mL    Comment: Performed at [facility], [address] [place], [place] [address]  Triglycerides     Status: None   Collection Time: 10/29/19  5:26 PM  Result Value Ref Range   Triglycerides 40 <150 mg/dL    Comment: Performed at [facility], [address], [place], [place] [address]  Fibrinogen     Status: Abnormal   Collection Time: 10/29/19  5:26 PM  Result Value Ref Range   Fibrinogen 206 (L) 210 - 475 mg/dL    Comment: Performed at [facility], [address], [place], [place] [address]  C-reactive protein     Status: Abnormal   Collection Time: 10/29/19  5:26 PM  Result Value Ref Range   CRP  1.1 (H) <1.0 mg/dL    Comment: Performed at [facility], [address] [place], [place] [address]  Urinalysis, Routine w reflex microscopic Urine, Catheterized     Status: Abnormal   Collection Time: 10/29/19  5:35 PM  Result Value Ref Range   Color, Urine YELLOW YELLOW   APPearance CLEAR CLEAR   Specific Gravity, Urine 1.012 1.005 - 1.030   pH 5.0 5.0 - 8.0   Glucose, UA NEGATIVE NEGATIVE mg/dL   Hgb urine dipstick NEGATIVE NEGATIVE   Bilirubin Urine NEGATIVE NEGATIVE   Ketones, ur NEGATIVE NEGATIVE mg/dL   Protein, ur 30 (A) NEGATIVE mg/dL   Nitrite NEGATIVE NEGATIVE   Leukocytes,Ua NEGATIVE NEGATIVE   WBC, UA 0-5 0 - 5 WBC/hpf   Bacteria, UA RARE (A) NONE SEEN   Squamous Epithelial / LPF 0-5 0 - 5   Mucus PRESENT    Hyaline Casts, UA PRESENT     Comment: Performed at [name], [address] [place], [place] [address]  Blood Culture (routine x 2)     Status: None   Collection Time: 10/29/19  5:56 PM   Specimen: Left Antecubital; Blood  Result Value Ref Range   Specimen Description LEFT ANTECUBITAL    Special Requests      BOTTLES DRAWN AEROBIC AND ANAEROBIC Blood Culture adequate volume   Culture      NO GROWTH 5 DAYS Performed at [facility], [address], [place], [place] [address]    Report Status 11/03/2019 FINAL   POC occult blood, ED     Status: Abnormal   Collection Time: 10/29/19  6:41 PM  Result Value Ref Range   Fecal Occult Bld POSITIVE (A) NEGATIVE  Lactic acid, plasma     Status: Abnormal   Collection Time: 10/29/19  8:14 PM  Result Value Ref Range   Lactic Acid, Venous 2.7 (HH) 0.5 - 1.9 mmol/L    Comment: CRITICAL VALUE NOTED.  VALUE IS CONSISTENT WITH PREVIOUSLY REPORTED AND CALLED VALUE. Performed at [facility], [address], [place], [place] [address]   Troponin I (High Sensitivity)     Status: Abnormal   Collection Time: 10/29/19  8:14 PM  Result Value Ref Range   Troponin I (High Sensitivity) 25 (H) <18 ng/L    Comment:  (NOTE) Elevated high sensitivity troponin I (hsTnI) values and significant  changes across serial measurements may suggest ACS but many other  chronic and acute conditions are known to elevate hsTnI results.  Refer to the "Links" section for chest pain algorithms and additional  guidance. Performed at [name], [address] [place],  [place] [address]   Basic metabolic panel     Status: Abnormal   Collection Time: 10/30/19  6:30 AM  Result Value Ref Range   Sodium 136 135 - 145 mmol/L   Potassium 3.8 3.5 - 5.1 mmol/L    Comment: DELTA CHECK NOTED   Chloride 96 (L) 98 - 111 mmol/L   CO2 25 22 - 32 mmol/L   Glucose, Bld 124 (H) 70 - 99 mg/dL    Comment: Glucose reference range applies only to samples taken after fasting for at least 8 hours.   BUN 29 (H) 8 - 23 mg/dL   Creatinine, Ser 2.67 (H) 0.44 - 1.00 mg/dL   Calcium 8.5 (L) 8.9 - 10.3 mg/dL   GFR calc non Af Amer 16 (L) >60 mL/min   GFR calc Af Amer 19 (L) >60 mL/min   Anion gap 15 5 - 15    Comment: Performed at [facility], [address] [place], [place] [address]  CBC     Status: Abnormal   Collection Time: 10/30/19  6:30 AM  Result Value Ref Range   WBC 10.3 4.0 - 10.5 K/uL   RBC 2.90 (L) 3.87 - 5.11 MIL/uL   Hemoglobin 7.2 (L) 12.0 - 15.0 g/dL   HCT 24.2 (L) 36 - 46 %   MCV 83.4 80.0 - 100.0 fL   MCH 24.8 (L) 26.0 - 34.0 pg   MCHC 29.8 (L) 30.0 - 36.0 g/dL   RDW 19.5 (H) 11.5 - 15.5 %   Platelets 193 150 - 400 K/uL   nRBC 0.0 0.0 - 0.2 %    Comment: Performed at [facility], [address], [place], [place] [address]  Magnesium     Status: None   Collection Time: 10/30/19  6:30 AM  Result Value Ref Range   Magnesium 1.9 1.7 - 2.4 mg/dL    Comment: Performed at [facility], [address], [place], [place] [address]  CBC     Status: Abnormal   Collection Time: 10/31/19  7:44 AM  Result Value Ref Range   WBC 12.8 (H) 4.0 - 10.5 K/uL   RBC 3.11 (L) 3.87 - 5.11 MIL/uL   Hemoglobin 7.6 (L) 12.0 - 15.0 g/dL    HCT 25.8 (L) 36 - 46 %   MCV 83.0 80.0 - 100.0 fL   MCH 24.4 (L) 26.0 - 34.0 pg   MCHC 29.5 (L) 30.0 - 36.0 g/dL   RDW 19.7 (H) 11.5 - 15.5 %   Platelets 279 150 - 400 K/uL   nRBC 0.0 0.0 - 0.2 %    Comment: Performed at [facility], [address] [place], [place] [address]  Basic metabolic panel     Status: Abnormal   Collection Time: 10/31/19  7:44 AM  Result Value Ref Range   Sodium 137 135 - 145 mmol/L   Potassium 3.6 3.5 - 5.1 mmol/L   Chloride 98 98 - 111 mmol/L   CO2 26 22 - 32 mmol/L   Glucose, Bld 99 70 - 99 mg/dL    Comment: Glucose reference range applies only to samples taken after fasting for at least 8 hours.   BUN 38 (H) 8 - 23 mg/dL   Creatinine, Ser 2.90 (H) 0.44 - 1.00 mg/dL   Calcium 8.9 8.9 - 10.3 mg/dL   GFR calc non Af Amer 15 (L) >60 mL/min   GFR calc Af Amer 17 (L) >60 mL/min   Anion gap 13 5 - 15    Comment:  Performed at [facility], [address] [place], [place] [address]  CBC     Status: Abnormal   Collection Time: 11/01/19  7:35 AM  Result Value Ref Range   WBC 7.7 4.0 - 10.5 K/uL   RBC 3.10 (L) 3.87 - 5.11 MIL/uL   Hemoglobin 7.5 (L) 12.0 - 15.0 g/dL   HCT 25.7 (L) 36 - 46 %   MCV 82.9 80.0 - 100.0 fL   MCH 24.2 (L) 26.0 - 34.0 pg   MCHC 29.2 (L) 30.0 - 36.0 g/dL   RDW 19.7 (H) 11.5 - 15.5 %   Platelets 246 150 - 400 K/uL   nRBC 0.0 0.0 - 0.2 %    Comment: Performed at [name], [address], [place], [place] [address]  Basic metabolic panel     Status: Abnormal   Collection Time: 11/01/19  7:35 AM  Result Value Ref Range   Sodium 139 135 - 145 mmol/L   Potassium 3.5 3.5 - 5.1 mmol/L   Chloride 98 98 - 111 mmol/L   CO2 26 22 - 32 mmol/L   Glucose, Bld 93 70 - 99 mg/dL    Comment: Glucose reference range applies only to samples taken after fasting for at least 8 hours.   BUN 41 (H) 8 - 23 mg/dL   Creatinine, Ser 2.77 (H) 0.44 - 1.00 mg/dL   Calcium 8.9 8.9 - 10.3 mg/dL   GFR calc non Af Amer 15 (L) >60 mL/min   GFR calc Af  Amer 18 (L) >60 mL/min   Anion gap 15 5 - 15    Comment: Performed at [facility], [address], [place], [place] [address]  CBC     Status: Abnormal   Collection Time: 11/02/19  6:02 AM  Result Value Ref Range   WBC 6.2 4.0 - 10.5 K/uL   RBC 2.99 (L) 3.87 - 5.11 MIL/uL   Hemoglobin 7.5 (L) 12.0 - 15.0 g/dL   HCT 24.9 (L) 36 - 46 %   MCV 83.3 80.0 - 100.0 fL   MCH 25.1 (L) 26.0 - 34.0 pg   MCHC 30.1 30.0 - 36.0 g/dL   RDW 19.9 (H) 11.5 - 15.5 %   Platelets 248 150 - 400 K/uL   nRBC 0.0 0.0 - 0.2 %    Comment: Performed at [facility], [address] [place], [place] [address]  Renal function panel     Status: Abnormal   Collection Time: 11/02/19  6:02 AM  Result Value Ref Range   Sodium 138 135 - 145 mmol/L   Potassium 3.9 3.5 - 5.1 mmol/L   Chloride 100 98 - 111 mmol/L   CO2 26 22 - 32 mmol/L   Glucose, Bld 96 70 - 99 mg/dL    Comment: Glucose reference range applies only to samples taken after fasting for at least 8 hours.   BUN 33 (H) 8 - 23 mg/dL   Creatinine, Ser 2.35 (H) 0.44 - 1.00 mg/dL   Calcium 8.9 8.9 - 10.3 mg/dL   Phosphorus 2.4 (L) 2.5 - 4.6 mg/dL   Albumin 3.5 3.5 - 5.0 g/dL   GFR calc non Af Amer 19 (L) >60 mL/min   GFR calc Af Amer 22 (L) >60 mL/min   Anion gap 12 5 - 15    Comment: Performed at [facility], [address] [place], [place] [address]  Basic metabolic panel     Status: Abnormal   Collection Time: 11/02/19  3:41 PM  Result  Value Ref Range   Sodium 139 135 - 145 mmol/L   Potassium 4.4 3.5 - 5.1 mmol/L   Chloride 100 98 - 111 mmol/L   CO2 26 22 - 32 mmol/L   Glucose, Bld 98 70 - 99 mg/dL    Comment: Glucose reference range applies only to samples taken after fasting for at least 8 hours.   BUN 28 (H) 8 - 23 mg/dL   Creatinine, Ser 2.38 (H) 0.44 - 1.00 mg/dL   Calcium 9.1 8.9 - 10.3 mg/dL   GFR calc non Af Amer 19 (L) >60 mL/min   GFR calc Af Amer 21 (L) >60 mL/min   Anion gap 13 5 - 15    Comment: Performed at [place] [address] [place], [place] [address]  Basic metabolic panel     Status: Abnormal   Collection Time: 11/03/19 12:35 PM  Result Value Ref Range   Sodium 139 135 - 145 mmol/L   Potassium 4.2 3.5 - 5.1 mmol/L   Chloride 102 98 - 111 mmol/L   CO2 27 22 - 32 mmol/L   Glucose, Bld 99 70 - 99 mg/dL    Comment: Glucose reference range applies only to samples taken after fasting for at least 8 hours.   BUN 28 (H) 8 - 23 mg/dL   Creatinine, Ser 2.46 (H) 0.44 - 1.00 mg/dL   Calcium 9.2 8.9 - 10.3 mg/dL   GFR calc non Af Amer 18 (L) >60 mL/min   GFR calc Af Amer 21 (L) >60 mL/min   Anion gap 10 5 - 15    Comment: Performed at [place] [address], [place], [place] [address]  CBC     Status: Abnormal   Collection Time: 11/03/19 12:35 PM  Result Value Ref Range   WBC 7.3 4.0 - 10.5 K/uL   RBC 2.96 (L) 3.87 - 5.11 MIL/uL   Hemoglobin 7.4 (L) 12.0 - 15.0 g/dL   HCT 24.8 (L) 36 - 46 %   MCV 83.8 80.0 - 100.0 fL   MCH 25.0 (L) 26.0 - 34.0 pg   MCHC 29.8 (L) 30.0 - 36.0 g/dL   RDW 19.9 (H) 11.5 - 15.5 %   Platelets 258 150 - 400 K/uL   nRBC 0.8 (H) 0.0 - 0.2 %    Comment: Performed at [place] [address] [place], [place] [address]  Troponin I (High Sensitivity)     Status: Abnormal   Collection Time: 11/03/19 12:35 PM  Result Value Ref Range   Troponin I (High Sensitivity) 18 (H) <18 ng/L    Comment: (NOTE) Elevated high sensitivity troponin I (hsTnI) values and significant  changes across serial measurements may suggest ACS but many other  chronic and acute conditions are known to elevate hsTnI results.  Refer to the "Links" section for chest pain algorithms and additional  guidance. Performed at [facility], [place] [address] [place], [place] [address]   Troponin I (High Sensitivity)     Status: None   Collection Time: 11/03/19  7:59 PM  Result Value Ref Range   Troponin I (High Sensitivity) 17 <18 ng/L    Comment: (NOTE) Elevated high sensitivity  troponin I (hsTnI) values and significant  changes across serial measurements may suggest ACS but many other  chronic and acute conditions are known to elevate hsTnI results.  Refer to the "Links" section for chest pain algorithms and additional  guidance. Performed at [facility], [place] [address] [place], [place] [address]   ECHOCARDIOGRAM COMPLETE     Status: None   Collection Time: 11/04/19 11:42 AM  Result Value Ref Range   BP 123/68 mmHg   S' Lateral 1.89 cm   Area-P 1/2 6.17 cm2  SARS Coronavirus 2 by RT PCR (hospital order, performed in [place] hospital lab) Nasopharyngeal Nasopharyngeal Swab     Status: None   Collection Time: 11/04/19  2:44 PM   Specimen: Nasopharyngeal Swab  Result Value Ref Range   SARS Coronavirus 2 NEGATIVE NEGATIVE    Comment: (NOTE) SARS-CoV-2 target nucleic acids are NOT DETECTED.  The SARS-CoV-2 RNA is generally detectable in upper and lower respiratory specimens during the acute phase of infection. The lowest concentration of SARS-CoV-2 viral copies this assay can detect is 250 copies / mL. A negative result does not preclude SARS-CoV-2 infection and should not be used as the sole basis for treatment or other patient management decisions.  A negative result may occur with improper specimen collection / handling, submission of specimen other than nasopharyngeal swab, presence of viral mutation(s) within the areas targeted by this assay, and inadequate number of viral copies (<250 copies / mL). A negative result must be combined with clinical observations, patient history, and epidemiological information.  Fact Sheet for Patients:   StrictlyIdeas.no  Fact Sheet for Healthcare Providers: BankingDealers.co.za  This test is not yet approved or  cleared by the [place] FDA and has been authorized for detection and/or diagnosis of SARS-CoV-2 by FDA under an Emergency Use Authorization  (EUA).  This EUA will remain in effect (meaning this test can be used) for the duration of the COVID-19 declaration under Section 564(b)(1) of the Act, 21 U.S.C. section 360bbb-3(b)(1), unless the authorization is terminated or revoked sooner.  Performed at [facility], [place] [address], [place],  [address]   Basic metabolic panel     Status: Abnormal   Collection Time: 11/04/19  4:40 PM  Result Value Ref Range   Sodium 141 135 - 145 mmol/L   Potassium 4.2 3.5 - 5.1 mmol/L   Chloride 100 98 - 111 mmol/L   CO2 28 22 - 32 mmol/L   Glucose, Bld 90 70 - 99 mg/dL    Comment: Glucose reference range applies only to samples taken after fasting for at least 8 hours.   BUN 26 (H) 8 - 23 mg/dL   Creatinine, Ser 2.43 (H) 0.44 - 1.00 mg/dL   Calcium 9.2 8.9 - 10.3 mg/dL   GFR calc non Af Amer 18 (L) >60 mL/min   GFR calc Af Amer 21 (L) >60 mL/min   Anion gap 13 5 - 15    Comment: Performed at [facility] [address], [place], [place] [address]  CBC     Status: Abnormal   Collection Time: 11/04/19  4:40 PM  Result Value Ref Range   WBC 7.4 4.0 - 10.5 K/uL   RBC 3.13 (L) 3.87 - 5.11 MIL/uL   Hemoglobin 7.7 (L) 12.0 - 15.0 g/dL   HCT 26.5 (L) 36 - 46 %   MCV 84.7 80.0 - 100.0 fL   MCH 24.6 (L) 26.0 - 34.0 pg   MCHC 29.1 (L) 30.0 - 36.0 g/dL   RDW 21.3 (H) 11.5 - 15.5 %   Platelets 254 150 - 400 K/uL   nRBC 0.3 (H) 0.0 - 0.2 %    Comment: Performed at [facility], [address] [place], [place] [address]  Iron and TIBC     Status: None   Collection Time: 11/04/19  4:40 PM  Result Value Ref Range   Iron 74 28 - 170 ug/dL   TIBC 374 250 - 450 ug/dL   Saturation Ratios 20 10.4 - 31.8 %   UIBC 300 ug/dL    Comment: Performed at [facility] [address], [place], [place] [address]  Ferritin     Status: Abnormal   Collection Time: 11/04/19  4:40 PM  Result Value Ref Range   Ferritin 311 (H) 11 - 307 ng/mL    Comment: Performed at [facility], [place] [address], [place], [place] [address]  TSH     Status: None   Collection Time: 11/04/19  5:00 PM  Result Value Ref Range   TSH 1.887 0.350 - 4.500 uIU/mL    Comment: Performed by a 3rd Generation assay with a functional sensitivity of <=0.01 uIU/mL. Performed at [facility], [place] [address], [place], [place] [address]   Brain natriuretic peptide     Status: Abnormal   Collection Time: 11/04/19  5:00 PM  Result Value Ref Range   B Natriuretic Peptide 1,511.3 (H) 0.0 - 100.0 pg/mL    Comment: Performed at [facility] [address] [place], [place] [address]  Basic metabolic panel     Status: Abnormal   Collection Time: 11/05/19  4:48 AM  Result Value Ref Range   Sodium 141 135 - 145 mmol/L   Potassium 4.1 3.5 - 5.1 mmol/L   Chloride 101 98 - 111 mmol/L   CO2 28 22 - 32 mmol/L   Glucose, Bld 89 70 - 99 mg/dL    Comment: Glucose reference range applies only to samples taken after fasting for at least 8 hours.   BUN 24 (H) 8 - 23 mg/dL   Creatinine, Ser 2.55 (H) 0.44 - 1.00 mg/dL   Calcium 9.0 8.9 - 10.3 mg/dL   GFR calc non Af Amer 17 (L) >60 mL/min   GFR calc Af Amer 20 (L) >60 mL/min   Anion gap 12 5 - 15    Comment: Performed at [facility] [address] [place], [place] [address]  Magnesium     Status: None   Collection Time: 11/05/19  4:48 AM  Result Value Ref Range   Magnesium 2.1 1.7 - 2.4 mg/dL    Comment: Performed at [facility] [address], [place], [place] [address]  CBC     Status: Abnormal   Collection Time: 11/05/19  4:48 AM  Result Value Ref Range   WBC 6.7 4.0 - 10.5 K/uL   RBC 3.08 (L) 3.87 - 5.11 MIL/uL   Hemoglobin 7.5 (L) 12.0 - 15.0 g/dL   HCT 25.8 (L) 36 - 46 %   MCV 83.8 80.0 - 100.0 fL   MCH 24.4 (L) 26.0 - 34.0 pg   MCHC 29.1 (L) 30.0 - 36.0 g/dL   RDW 21.8 (H) 11.5 - 15.5 %   Platelets 229 150 - 400 K/uL   nRBC 0.0 0.0 - 0.2 %    Comment: Performed at [facility], [place] [address]  [place],  [address]  Basic metabolic panel     Status: Abnormal   Collection Time: 11/06/19  4:33 AM  Result Value Ref Range   Sodium 142 135 - 145 mmol/L   Potassium 4.1 3.5 - 5.1  mmol/L   Chloride 102 98 - 111 mmol/L   CO2 30 22 - 32 mmol/L   Glucose, Bld 98 70 - 99 mg/dL    Comment: Glucose reference range applies only to samples taken after fasting for at least 8 hours.   BUN 21 8 - 23 mg/dL   Creatinine, Ser 2.40 (H) 0.44 - 1.00 mg/dL   Calcium 9.1 8.9 - 10.3 mg/dL   GFR calc non Af Amer 18 (L) >60 mL/min   GFR calc Af Amer 21 (L) >60 mL/min   Anion gap 10 5 - 15    Comment: Performed at [facility] [address], [place], [place] [address]  CBC     Status: Abnormal   Collection Time: 11/06/19  4:33 AM  Result Value Ref Range   WBC 5.6 4.0 - 10.5 K/uL   RBC 3.08 (L) 3.87 - 5.11 MIL/uL   Hemoglobin 7.8 (L) 12.0 - 15.0 g/dL   HCT 26.3 (L) 36 - 46 %   MCV 85.4 80.0 - 100.0 fL   MCH 25.3 (L) 26.0 - 34.0 pg   MCHC 29.7 (L) 30.0 - 36.0 g/dL   RDW 23.0 (H) 11.5 - 15.5 %   Platelets 221 150 - 400 K/uL   nRBC 0.0 0.0 - 0.2 %    Comment: Performed at [facility], [place] [address], [place], [place] [address]  Magnesium     Status: None   Collection Time: 11/06/19  4:33 AM  Result Value Ref Range   Magnesium 2.1 1.7 - 2.4 mg/dL    Comment: Performed at [facility] [address] [place], [place] [address]  Basic metabolic panel     Status: Abnormal   Collection Time: 11/07/19  4:41 AM  Result Value Ref Range   Sodium 143 135 - 145 mmol/L   Potassium 3.7 3.5 - 5.1 mmol/L   Chloride 100 98 - 111 mmol/L   CO2 31 22 - 32 mmol/L   Glucose, Bld 94 70 - 99 mg/dL    Comment: Glucose reference range applies only to samples taken after fasting for at least 8 hours.   BUN 20 8 - 23 mg/dL   Creatinine, Ser 2.35 (H) 0.44 - 1.00 mg/dL   Calcium 9.3 8.9 - 10.3 mg/dL   GFR calc non Af Amer 19 (L) >60 mL/min   GFR calc Af Amer 22 (L) >60 mL/min   Anion gap 12 5 - 15     Comment: Performed at [facility] [address], [place], [place] [address]  CBC     Status: Abnormal   Collection Time: 11/07/19  4:41 AM  Result Value Ref Range   WBC 5.0 4.0 - 10.5 K/uL   RBC 3.05 (L) 3.87 - 5.11 MIL/uL   Hemoglobin 7.6 (L) 12.0 - 15.0 g/dL   HCT 26.1 (L) 36 - 46 %   MCV 85.6 80.0 - 100.0 fL   MCH 24.9 (L) 26.0 - 34.0 pg   MCHC 29.1 (L) 30.0 - 36.0 g/dL   RDW 23.9 (H) 11.5 - 15.5 %   Platelets 203 150 - 400 K/uL   nRBC 0.0 0.0 - 0.2 %    Comment: Performed at [facility], [place] [address] [place], [place] [address]  Basic metabolic panel     Status: Abnormal   Collection Time: 11/08/19  6:29 AM  Result Value Ref Range   Sodium 142 135 - 145 mmol/L  Potassium 3.7 3.5 - 5.1 mmol/L   Chloride 98 98 - 111 mmol/L   CO2 32 22 - 32 mmol/L   Glucose, Bld 85 70 - 99 mg/dL    Comment: Glucose reference range applies only to samples taken after fasting for at least 8 hours.   BUN 17 8 - 23 mg/dL   Creatinine, Ser 2.21 (H) 0.44 - 1.00 mg/dL   Calcium 9.3 8.9 - 10.3 mg/dL   GFR calc non Af Amer 20 (L) >60 mL/min   GFR calc Af Amer 23 (L) >60 mL/min   Anion gap 12 5 - 15    Comment: Performed at [facility] [address] [place], [place] [address]  CBC     Status: Abnormal   Collection Time: 11/08/19  6:29 AM  Result Value Ref Range   WBC 5.1 4.0 - 10.5 K/uL   RBC 3.36 (L) 3.87 - 5.11 MIL/uL   Hemoglobin 8.6 (L) 12.0 - 15.0 g/dL   HCT 28.7 (L) 36 - 46 %   MCV 85.4 80.0 - 100.0 fL   MCH 25.6 (L) 26.0 - 34.0 pg   MCHC 30.0 30.0 - 36.0 g/dL   RDW 24.6 (H) 11.5 - 15.5 %   Platelets 193 150 - 400 K/uL   nRBC 0.0 0.0 - 0.2 %    Comment: Performed at [facility], [place] [address] [place], [place] [address]  Basic metabolic panel     Status: Abnormal   Collection Time: 11/09/19  4:10 AM  Result Value Ref Range   Sodium 140 135 - 145 mmol/L   Potassium 3.7 3.5 - 5.1 mmol/L   Chloride 97 (L) 98 - 111 mmol/L   CO2 32 22 - 32 mmol/L    Glucose, Bld 87 70 - 99 mg/dL    Comment: Glucose reference range applies only to samples taken after fasting for at least 8 hours.   BUN 15 8 - 23 mg/dL   Creatinine, Ser 2.23 (H) 0.44 - 1.00 mg/dL   Calcium 8.8 (L) 8.9 - 10.3 mg/dL   GFR calc non Af Amer 20 (L) >60 mL/min   GFR calc Af Amer 23 (L) >60 mL/min   Anion gap 11 5 - 15    Comment: Performed at [facility] [address], [place], [place] [address]  CBC     Status: Abnormal   Collection Time: 11/09/19  4:10 AM  Result Value Ref Range   WBC 5.0 4.0 - 10.5 K/uL   RBC 3.20 (L) 3.87 - 5.11 MIL/uL   Hemoglobin 8.3 (L) 12.0 - 15.0 g/dL   HCT 27.5 (L) 36 - 46 %   MCV 85.9 80.0 - 100.0 fL   MCH 25.9 (L) 26.0 - 34.0 pg   MCHC 30.2 30.0 - 36.0 g/dL   RDW 25.1 (H) 11.5 - 15.5 %   Platelets 180 150 - 400 K/uL   nRBC 0.0 0.0 - 0.2 %    Comment: Performed at [facility], [place] [address], [place], [place] [address]  POCT I-Stat EG7     Status: Abnormal   Collection Time: 11/09/19  3:30 PM  Result Value Ref Range   pH, Ven 7.417 7.25 - 7.43   pCO2, Ven 50.2 44 - 60 mmHg   pO2, Ven 35.0 32 - 45 mmHg   Bicarbonate 32.3 (H) 20.0 - 28.0 mmol/L   TCO2 34 (H) 22 - 32 mmol/L   O2 Saturation 66.0 %   Acid-Base Excess 7.0 (H) 0.0 - 2.0 mmol/L   Sodium 148 (H) 135 - 145 mmol/L   Potassium 3.8 3.5 - 5.1 mmol/L   Calcium, Ion 0.96 (L) 1.15 - 1.40 mmol/L   HCT 28.0 (L) 36 - 46 %   Hemoglobin 9.5 (L) 12.0 - 15.0 g/dL   Collection site art line    Drawn by Nurse    Sample type VENOUS   Basic metabolic panel     Status: Abnormal   Collection Time: 11/10/19  9:00 AM  Result Value Ref Range   Sodium 139 135 - 145 mmol/L   Potassium 4.0 3.5 - 5.1 mmol/L   Chloride 98 98 - 111 mmol/L   CO2 32 22 - 32 mmol/L   Glucose, Bld 117  (H) 70 - 99 mg/dL    Comment: Glucose reference range applies only to samples taken after fasting for at least 8 hours.   BUN 18 8 - 23 mg/dL   Creatinine, Ser 2.15 (H) 0.44 - 1.00 mg/dL   Calcium 8.8 (L) 8.9 - 10.3 mg/dL   GFR calc non Af Amer 21 (L) >60 mL/min   GFR calc Af Amer 24 (L) >60 mL/min   Anion gap 9 5 - 15    Comment: Performed at [facility] [address], [place], [place] [address]  CBC     Status: Abnormal   Collection Time: 11/10/19  9:00 AM  Result Value Ref Range   WBC 5.9 4.0 - 10.5 K/uL   RBC 3.38 (L) 3.87 - 5.11 MIL/uL   Hemoglobin 8.5 (L) 12.0 - 15.0 g/dL   HCT 29.4 (L) 36 - 46 %   MCV 87.0 80.0 - 100.0 fL   MCH 25.1 (L) 26.0 - 34.0 pg   MCHC 28.9 (L) 30.0 - 36.0 g/dL   RDW 25.3 (H) 11.5 - 15.5 %   Platelets 158 150 - 400 K/uL   nRBC 0.0 0.0 - 0.2 %    Comment: Performed at [facility], [place] [address] [place], [place] [address]  Basic metabolic panel     Status: Abnormal   Collection Time: 11/11/19  9:19 AM  Result Value Ref Range   Sodium 139 135 - 145 mmol/L   Potassium 3.9 3.5 - 5.1 mmol/L   Chloride 99 98 - 111 mmol/L   CO2 33 (H) 22 - 32 mmol/L   Glucose, Bld 98 70 - 99 mg/dL    Comment: Glucose reference range applies only to samples taken after fasting for at least 8 hours.   BUN 16 8 - 23 mg/dL   Creatinine, Ser 2.11 (H) 0.44 - 1.00 mg/dL   Calcium 8.9 8.9 - 10.3 mg/dL   GFR calc non Af Amer 21 (L) >60 mL/min   GFR calc Af Amer 25 (L) >60 mL/min   Anion gap 7 5 - 15    Comment: Performed at [facility] [address], [place], [place] [address]    RADIOGRAPHIC STUDIES: I have personally reviewed the radiological images as listed and agreed  with the findings in the report. DG Chest 2 View  Result Date: 11/03/2019 CLINICAL DATA:  Afib x 2 weeks, SOB x 3-4 weeks ago, pt states she feels pressure on her chest, HTN, former smoker EXAM: CHEST - 2 VIEW COMPARISON:  10/29/2019 FINDINGS: Moderate enlargement of the  cardiopericardial silhouette. No mediastinal or hilar masses. No evidence of adenopathy. Left upper lobe linear opacity noted consistent with atelectasis or scarring, similar to the prior study. Mild bilateral interstitial thickening is stable. There is lung base opacity consistent with atelectasis associated with small effusions. No convincing pneumonia. Chronic appearing compression fractures at the thoracolumbar junction present on a chest CT dated 09/05/2019. No acute skeletal abnormality. IMPRESSION: 1. Moderate cardiomegaly, small effusions and bilateral interstitial thickening suggest mild congestive heart failure, similar to the prior chest radiograph. No evidence of pneumonia. Electronically Signed   By: Lajean Manes M.D.   On: 11/03/2019 13:04    ASSESSMENT:  1.  Normocytic anemia: -Recent admission to the hospital with anemia.  On 10/29/2019, ferritin was 9. -Received Feraheme on 10/31/2019. 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  Complains of fatigue.  Denies any bleeding per rectum or melena. -Eliquis discontinued 3 weeks ago. -Patient started taking iron tablet daily last week. -Colonoscopy on 09/06/2019 shows diverticulosis in the sigmoid and descending colon.  Eight 4-8 mm polyps in the sigmoid colon, descending colon at hepatic flexure and in the cecum.  13 mm polyp in the descending colon. -EGD on 09/06/2019 showed mild Schatzki ring, dilated.  There is a gastropathy with no stigmata of bleeding.  Normal duodenum.  2.  Right breast cancer: -Reportedly diagnosed 21 years ago and underwent bilateral mastectomy followed by 6 months of chemotherapy.  3.  Social/family history: -She worked in [employer].  Quit smoking at age 24.  Smoked half pack to 1 pack/day for 25 years. -1 sister died of metastatic breast cancer.  Another sister had breast cancer.    PLAN:  1.  Normocytic anemia: -I have recommended a repeat CBC, ferritin, iron panel.  We will also check for other nutritional deficiencies  including N22, folic acid, methylmalonic acid and copper levels.  We will check for hemolysis with LDH, reticulocyte count and haptoglobin. -I have recommended Feraheme x1. -I will reevaluate her in 4 weeks.  I plan to repeat CBC at that time to evaluate response. -She was told to continue taking iron tablet daily.  2.  CKD: -Her creatinine is around 2.0-2.4. -She follows up with Dr. Theador Hawthorne.  3.  Atrial fibrillation: -Eliquis was discontinued 3 weeks ago.  4.  Family history: -Because of personal history of breast cancer and family history of breast cancer in her 2 sisters, I have recommended genetic testing.   All questions were answered. The patient knows to call the clinic with any problems, questions or concerns.   Derek Jack, MD 11/26/19 8:36 AM  [facility] [phone]   I, Milinda Antis, am acting as a scribe for Dr. Sanda Linger.  I, Derek Jack MD, have reviewed the above documentation for accuracy and completeness, and I agree with the above.

## 2019-11-26 ENCOUNTER — Inpatient Hospital Stay (HOSPITAL_COMMUNITY): Payer: Medicare Other | Attending: Hematology | Admitting: Hematology

## 2019-11-26 ENCOUNTER — Encounter (HOSPITAL_COMMUNITY): Payer: Self-pay | Admitting: Hematology

## 2019-11-26 ENCOUNTER — Inpatient Hospital Stay (HOSPITAL_COMMUNITY): Payer: Medicare Other

## 2019-11-26 ENCOUNTER — Other Ambulatory Visit: Payer: Self-pay

## 2019-11-26 VITALS — BP 105/70 | HR 75 | Temp 98.1°F | Resp 18 | Ht 64.0 in | Wt 126.0 lb

## 2019-11-26 DIAGNOSIS — N184 Chronic kidney disease, stage 4 (severe): Secondary | ICD-10-CM | POA: Insufficient documentation

## 2019-11-26 DIAGNOSIS — I482 Chronic atrial fibrillation, unspecified: Secondary | ICD-10-CM | POA: Diagnosis not present

## 2019-11-26 DIAGNOSIS — I509 Heart failure, unspecified: Secondary | ICD-10-CM | POA: Insufficient documentation

## 2019-11-26 DIAGNOSIS — Z87891 Personal history of nicotine dependence: Secondary | ICD-10-CM | POA: Insufficient documentation

## 2019-11-26 DIAGNOSIS — Z79899 Other long term (current) drug therapy: Secondary | ICD-10-CM | POA: Diagnosis not present

## 2019-11-26 DIAGNOSIS — K219 Gastro-esophageal reflux disease without esophagitis: Secondary | ICD-10-CM | POA: Diagnosis not present

## 2019-11-26 DIAGNOSIS — Z853 Personal history of malignant neoplasm of breast: Secondary | ICD-10-CM | POA: Diagnosis not present

## 2019-11-26 DIAGNOSIS — Z803 Family history of malignant neoplasm of breast: Secondary | ICD-10-CM | POA: Diagnosis not present

## 2019-11-26 DIAGNOSIS — I110 Hypertensive heart disease with heart failure: Secondary | ICD-10-CM | POA: Diagnosis not present

## 2019-11-26 DIAGNOSIS — E538 Deficiency of other specified B group vitamins: Secondary | ICD-10-CM | POA: Diagnosis not present

## 2019-11-26 DIAGNOSIS — D649 Anemia, unspecified: Secondary | ICD-10-CM | POA: Diagnosis not present

## 2019-11-26 DIAGNOSIS — Z9013 Acquired absence of bilateral breasts and nipples: Secondary | ICD-10-CM | POA: Diagnosis not present

## 2019-11-26 DIAGNOSIS — Z9221 Personal history of antineoplastic chemotherapy: Secondary | ICD-10-CM | POA: Insufficient documentation

## 2019-11-26 LAB — CBC WITH DIFFERENTIAL/PLATELET
Abs Immature Granulocytes: 0.01 10*3/uL (ref 0.00–0.07)
Basophils Absolute: 0 10*3/uL (ref 0.0–0.1)
Basophils Relative: 1 %
Eosinophils Absolute: 0.3 10*3/uL (ref 0.0–0.5)
Eosinophils Relative: 5 %
HCT: 32.8 % — ABNORMAL LOW (ref 36.0–46.0)
Hemoglobin: 9.8 g/dL — ABNORMAL LOW (ref 12.0–15.0)
Immature Granulocytes: 0 %
Lymphocytes Relative: 21 %
Lymphs Abs: 1.2 10*3/uL (ref 0.7–4.0)
MCH: 27.4 pg (ref 26.0–34.0)
MCHC: 29.9 g/dL — ABNORMAL LOW (ref 30.0–36.0)
MCV: 91.6 fL (ref 80.0–100.0)
Monocytes Absolute: 0.6 10*3/uL (ref 0.1–1.0)
Monocytes Relative: 10 %
Neutro Abs: 3.7 10*3/uL (ref 1.7–7.7)
Neutrophils Relative %: 63 %
Platelets: 183 10*3/uL (ref 150–400)
RBC: 3.58 MIL/uL — ABNORMAL LOW (ref 3.87–5.11)
RDW: 25.9 % — ABNORMAL HIGH (ref 11.5–15.5)
WBC: 5.9 10*3/uL (ref 4.0–10.5)
nRBC: 0 % (ref 0.0–0.2)

## 2019-11-26 LAB — COMPREHENSIVE METABOLIC PANEL
ALT: 12 U/L (ref 0–44)
AST: 26 U/L (ref 15–41)
Albumin: 3.7 g/dL (ref 3.5–5.0)
Alkaline Phosphatase: 98 U/L (ref 38–126)
Anion gap: 10 (ref 5–15)
BUN: 32 mg/dL — ABNORMAL HIGH (ref 8–23)
CO2: 29 mmol/L (ref 22–32)
Calcium: 9.1 mg/dL (ref 8.9–10.3)
Chloride: 96 mmol/L — ABNORMAL LOW (ref 98–111)
Creatinine, Ser: 2.44 mg/dL — ABNORMAL HIGH (ref 0.44–1.00)
GFR calc Af Amer: 21 mL/min — ABNORMAL LOW (ref >60)
GFR calc non Af Amer: 18 mL/min — ABNORMAL LOW (ref >60)
Glucose, Bld: 97 mg/dL (ref 70–99)
Potassium: 3.7 mmol/L (ref 3.5–5.1)
Sodium: 135 mmol/L (ref 135–145)
Total Bilirubin: 1.3 mg/dL — ABNORMAL HIGH (ref 0.3–1.2)
Total Protein: 7.8 g/dL (ref 6.5–8.1)

## 2019-11-26 LAB — IRON AND TIBC
Iron: 67 ug/dL (ref 28–170)
Saturation Ratios: 19 % (ref 10.4–31.8)
TIBC: 353 ug/dL (ref 250–450)
UIBC: 286 ug/dL

## 2019-11-26 LAB — RETICULOCYTES
Immature Retic Fract: 6.4 % (ref 2.3–15.9)
RBC.: 3.59 MIL/uL — ABNORMAL LOW (ref 3.87–5.11)
Retic Count, Absolute: 35.5 10*3/uL (ref 19.0–186.0)
Retic Ct Pct: 1 % (ref 0.4–3.1)

## 2019-11-26 LAB — LACTATE DEHYDROGENASE: LDH: 153 U/L (ref 98–192)

## 2019-11-26 LAB — VITAMIN B12: Vitamin B-12: 356 pg/mL (ref 180–914)

## 2019-11-26 LAB — VITAMIN D 25 HYDROXY (VIT D DEFICIENCY, FRACTURES): Vit D, 25-Hydroxy: 36.98 ng/mL (ref 30–100)

## 2019-11-26 LAB — FERRITIN: Ferritin: 47 ng/mL (ref 11–307)

## 2019-11-26 NOTE — Patient Instructions (Signed)
[place] at [facility] Discharge Instructions  You were seen today by [name]. He went over your recent results. You had labs drawn today for further analysis. You will be referred to a geneticist for genetic analysis. You will have one more iron infusion. [name] will see you back in 4 weeks for labs and follow up.   Thank you for choosing [place] at [facility] to provide your oncology and hematology care.  To afford each patient quality time with our provider, please arrive at least 15 minutes before your scheduled appointment time.   If you have a lab appointment with the [facility] please come in thru the Main Entrance and check in at the main information desk  You need to re-schedule your appointment should you arrive 10 or more minutes late.  We strive to give you quality time with our providers, and arriving late affects you and other patients whose appointments are after yours.  Also, if you no show three or more times for appointments you may be dismissed from the clinic at the providers discretion.     Again, thank you for choosing [facility].  Our hope is that these requests will decrease the amount of time that you wait before being seen by our physicians.       _____________________________________________________________  Should you have questions after your visit to [name], please contact our office at [phone] between the hours of 8:00 a.m. and 4:30 p.m.  Voicemails left after 4:00 p.m. will not be returned until the following business day.  For prescription refill requests, have your pharmacy contact our office and allow 72 hours.    Cancer Center Support Programs:   > Cancer Support Group  2nd Tuesday of the month 1pm-2pm, Journey Room

## 2019-11-27 DIAGNOSIS — N390 Urinary tract infection, site not specified: Secondary | ICD-10-CM | POA: Diagnosis not present

## 2019-11-27 LAB — HAPTOGLOBIN: Haptoglobin: 143 mg/dL (ref 41–333)

## 2019-11-27 LAB — PTH, INTACT AND CALCIUM
Calcium, Total (PTH): 9.4 mg/dL (ref 8.7–10.3)
PTH: 58 pg/mL (ref 15–65)

## 2019-11-27 LAB — PROTEIN ELECTROPHORESIS, SERUM
A/G Ratio: 1 (ref 0.7–1.7)
Albumin ELP: 3.5 g/dL (ref 2.9–4.4)
Alpha-1-Globulin: 0.3 g/dL (ref 0.0–0.4)
Alpha-2-Globulin: 0.6 g/dL (ref 0.4–1.0)
Beta Globulin: 1 g/dL (ref 0.7–1.3)
Gamma Globulin: 1.6 g/dL (ref 0.4–1.8)
Globulin, Total: 3.5 g/dL (ref 2.2–3.9)
Total Protein ELP: 7 g/dL (ref 6.0–8.5)

## 2019-12-01 ENCOUNTER — Other Ambulatory Visit (HOSPITAL_COMMUNITY): Payer: Self-pay | Admitting: Cardiology

## 2019-12-01 MED ORDER — BISOPROLOL FUMARATE 5 MG PO TABS: 2.5000 mg | ORAL_TABLET | Freq: Every day | ORAL | 3 refills | Status: DC

## 2019-12-02 ENCOUNTER — Encounter (HOSPITAL_COMMUNITY): Payer: Medicare Other | Admitting: Cardiology

## 2019-12-03 ENCOUNTER — Other Ambulatory Visit (HOSPITAL_COMMUNITY): Payer: Self-pay | Admitting: Hematology

## 2019-12-03 LAB — COPPER, SERUM: Copper: 135 ug/dL (ref 80–158)

## 2019-12-04 ENCOUNTER — Other Ambulatory Visit: Payer: Self-pay

## 2019-12-04 ENCOUNTER — Emergency Department (HOSPITAL_COMMUNITY)
Admission: EM | Admit: 2019-12-04 | Discharge: 2019-12-04 | Disposition: A | Discharge status: Home / Self Care | Payer: Medicare Other | Attending: Emergency Medicine | Admitting: Emergency Medicine

## 2019-12-04 ENCOUNTER — Encounter (HOSPITAL_COMMUNITY): Payer: Self-pay

## 2019-12-04 ENCOUNTER — Inpatient Hospital Stay (HOSPITAL_COMMUNITY): Payer: Medicare Other

## 2019-12-04 VITALS — BP 152/108 | HR 138 | Temp 97.9°F | Resp 24

## 2019-12-04 DIAGNOSIS — I5043 Acute on chronic combined systolic (congestive) and diastolic (congestive) heart failure: Secondary | ICD-10-CM | POA: Diagnosis not present

## 2019-12-04 DIAGNOSIS — I130 Hypertensive heart and chronic kidney disease with heart failure and stage 1 through stage 4 chronic kidney disease, or unspecified chronic kidney disease: Secondary | ICD-10-CM | POA: Diagnosis not present

## 2019-12-04 DIAGNOSIS — Z87891 Personal history of nicotine dependence: Secondary | ICD-10-CM | POA: Insufficient documentation

## 2019-12-04 DIAGNOSIS — Z79899 Other long term (current) drug therapy: Secondary | ICD-10-CM | POA: Insufficient documentation

## 2019-12-04 DIAGNOSIS — J449 Chronic obstructive pulmonary disease, unspecified: Secondary | ICD-10-CM | POA: Insufficient documentation

## 2019-12-04 DIAGNOSIS — J45909 Unspecified asthma, uncomplicated: Secondary | ICD-10-CM | POA: Diagnosis not present

## 2019-12-04 DIAGNOSIS — N184 Chronic kidney disease, stage 4 (severe): Secondary | ICD-10-CM | POA: Insufficient documentation

## 2019-12-04 DIAGNOSIS — R0602 Shortness of breath: Secondary | ICD-10-CM | POA: Diagnosis not present

## 2019-12-04 DIAGNOSIS — T7840XA Allergy, unspecified, initial encounter: Secondary | ICD-10-CM | POA: Diagnosis not present

## 2019-12-04 DIAGNOSIS — N1832 Chronic kidney disease, stage 3b: Secondary | ICD-10-CM | POA: Insufficient documentation

## 2019-12-04 DIAGNOSIS — D631 Anemia in chronic kidney disease: Secondary | ICD-10-CM | POA: Insufficient documentation

## 2019-12-04 DIAGNOSIS — D509 Iron deficiency anemia, unspecified: Secondary | ICD-10-CM

## 2019-12-04 MED ORDER — HEPARIN SOD (PORK) LOCK FLUSH 100 UNIT/ML IV SOLN: 500.0000 [IU] | Freq: Once | INTRAVENOUS | Status: DC | PRN

## 2019-12-04 MED ORDER — AEROCHAMBER PLUS FLO-VU MEDIUM MISC
1.0000 | Freq: Once | Status: DC
  Filled 2019-12-04: qty 1

## 2019-12-04 MED ORDER — ONDANSETRON 8 MG PO TBDP
8.0000 mg | ORAL_TABLET | Freq: Once | ORAL | Status: AC
  Administered 2019-12-04: 8 mg via ORAL

## 2019-12-04 MED ORDER — ONDANSETRON HCL 4 MG PO TABS: 8.0000 mg | ORAL_TABLET | Freq: Once | ORAL | Status: DC

## 2019-12-04 MED ORDER — SODIUM CHLORIDE 0.9 % IV SOLN: Freq: Once | INTRAVENOUS | Status: AC

## 2019-12-04 MED ORDER — FAMOTIDINE IN NACL 20-0.9 MG/50ML-% IV SOLN: 20.0000 mg | Freq: Once | INTRAVENOUS | Status: DC | PRN

## 2019-12-04 MED ORDER — METHYLPREDNISOLONE SODIUM SUCC 125 MG IJ SOLR
INTRAMUSCULAR | Status: AC
  Filled 2019-12-04: qty 2

## 2019-12-04 MED ORDER — SODIUM CHLORIDE 0.9% FLUSH: 10.0000 mL | Freq: Once | INTRAVENOUS | Status: DC | PRN

## 2019-12-04 MED ORDER — FAMOTIDINE IN NACL 20-0.9 MG/50ML-% IV SOLN
INTRAVENOUS | Status: AC
  Filled 2019-12-04: qty 50

## 2019-12-04 MED ORDER — FAMOTIDINE IN NACL 20-0.9 MG/50ML-% IV SOLN
20.0000 mg | Freq: Once | INTRAVENOUS | Status: DC
  Filled 2019-12-04: qty 50

## 2019-12-04 MED ORDER — ALBUTEROL SULFATE HFA 108 (90 BASE) MCG/ACT IN AERS
2.0000 | INHALATION_SPRAY | Freq: Once | RESPIRATORY_TRACT | Status: DC
  Filled 2019-12-04: qty 6.7

## 2019-12-04 MED ORDER — HEPARIN SOD (PORK) LOCK FLUSH 100 UNIT/ML IV SOLN: 250.0000 [IU] | Freq: Once | INTRAVENOUS | Status: DC | PRN

## 2019-12-04 MED ORDER — PREDNISONE 10 MG PO TABS: 40.0000 mg | ORAL_TABLET | Freq: Every day | ORAL | 0 refills | Status: AC

## 2019-12-04 MED ORDER — DIPHENHYDRAMINE HCL 50 MG/ML IJ SOLN
INTRAMUSCULAR | Status: AC
  Filled 2019-12-04: qty 1

## 2019-12-04 MED ORDER — ONDANSETRON 8 MG PO TBDP
ORAL_TABLET | ORAL | Status: AC
  Filled 2019-12-04: qty 1

## 2019-12-04 MED ORDER — SODIUM CHLORIDE 0.9% FLUSH: 3.0000 mL | Freq: Once | INTRAVENOUS | Status: DC | PRN

## 2019-12-04 MED ORDER — ALTEPLASE 2 MG IJ SOLR: 2.0000 mg | Freq: Once | INTRAMUSCULAR | Status: DC | PRN

## 2019-12-04 MED ORDER — METHYLPREDNISOLONE SODIUM SUCC 125 MG IJ SOLR
125.0000 mg | Freq: Once | INTRAMUSCULAR | Status: AC | PRN
  Administered 2019-12-04: 125 mg via INTRAVENOUS

## 2019-12-04 MED ORDER — SODIUM CHLORIDE 0.9 % IV SOLN
510.0000 mg | Freq: Once | INTRAVENOUS | Status: AC
  Administered 2019-12-04: 510 mg via INTRAVENOUS
  Filled 2019-12-04: qty 510

## 2019-12-04 MED ORDER — DIPHENHYDRAMINE HCL 50 MG/ML IJ SOLN
50.0000 mg | Freq: Once | INTRAMUSCULAR | Status: AC | PRN
  Administered 2019-12-04: 25 mg via INTRAVENOUS

## 2019-12-04 NOTE — Discharge Instructions (Addendum)
Follow up with your cardiologist- clarify your Eliquis instructions.  Return to ER for new or worsening symptoms. Take Prednisone as prescribed starting tomorrow.

## 2019-12-04 NOTE — ED Triage Notes (Signed)
Pt brought to ED by [facility]. Pt received Feraheme 510 mg at 1230 and started having chest pain, SOB, tachypnea, nausea and vomiting. Pt was given Benadryl 25 mg at 1255, Solumedrol 125 at 1255 and Zofran 8 mg ODT. Pt was started on 4L per [place]. Pt sent for evaluation

## 2019-12-04 NOTE — ED Provider Notes (Signed)
[facility] EMERGENCY DEPARTMENT Provider Note   CSN: [id number] Arrival date & time: 12/04/19  1345     History Chief Complaint  Patient presents with  . Allergic Reaction    Bailey Bishop is a 79 y.o. female.  79 year old female with history of a fib, CHF, brought to the ER from the cancer center. Patient received Feraheme 510 mg at 1230 and started having chest pain, SOB, tachypnea, nausea and vomiting and throat swelling. Pt was given Benadryl 25 mg at 1255, Solumedrol 125 at 1255 and Zofran 8 mg ODT. Patient was started on 4L per [place]. Patient reports feeling better, still feels tight in her chest with mild throat discomfort.   Patient has a history of a fib, recently stopped her Eliquis for her heart cath last month.         Past Medical History:  Diagnosis Date  . Anxiety   . Arthritis   . Asthma   . Atrial fibrillation ([place])    a. s/p unsuccessful DCCV in 01/2017 and intolerant to Amiodarone --> Rate-control strategy pursued since b. failed Tikosyn in 11/2017  . Breast cancer ([place]) 2001   Right  . CHF (congestive heart failure) ([place])   . Essential hypertension   . GERD (gastroesophageal reflux disease)   . T10 vertebral fracture [name])     Patient Active Problem List   Diagnosis Date Noted  . Normocytic anemia 11/26/2019  . Acute on chronic systolic heart failure, NYHA class 3 ([place]) 11/04/2019  . Fever 10/29/2019  . Acute blood loss anemia 10/29/2019  . Acute on chronic diastolic HF (heart failure) ([place]) 09/18/2019  . GI bleed 09/05/2019  . Symptomatic anemia 09/03/2019  . CKD (chronic kidney disease), stage IIIb 09/03/2019  . Iron deficiency anemia   . Benign neoplasm of transverse colon   . Diverticulosis of large intestine without diverticulitis   . Visit for monitoring Tikosyn therapy 12/02/2017  . Atrial fibrillation ([place]) 03/31/2017  . Persistent atrial fibrillation ([place])   . Anticoagulated 10/03/2016  . COPD (chronic obstructive pulmonary  disease) ([place]) 10/03/2016  . Hypokalemia 09/24/2016  . Asthma in adult without complication 98/92/1194  . Hyperglycemia 09/23/2016  . Anxiety   . GERD (gastroesophageal reflux disease)   . Shortness of breath   . Essential hypertension   . Status asthmaticus 07/07/2014    Past Surgical History:  Procedure Laterality Date  . BIOPSY N/A 04/22/2018   Procedure: BIOPSY;  Surgeon: Aviva Signs, MD;  [facility];  Service: Gastroenterology;  Laterality: N/A;  . BIOPSY  09/06/2019   Procedure: BIOPSY;  Surgeon: Daneil Dolin, MD;  Location[facility];  Service: Endoscopy;;  gastric  . CARDIOVERSION N/A 02/18/2017   Procedure: CARDIOVERSION;  Surgeon: Josue Hector, MD;  Location: [facility] ENDOSCOPY;  Service: Cardiovascular;  Laterality: N/A;  . CARDIOVERSION N/A 12/04/2017   Procedure: CARDIOVERSION;  Surgeon: [name], MD;  Location: [place];  Service: Cardiovascular;  Laterality: N/A;  . CATARACT EXTRACTION W/PHACO  07/23/2011   Procedure: CATARACT EXTRACTION PHACO AND INTRAOCULAR LENS PLACEMENT ([place]);  Surgeon: Williams Che, MD;  Location: [facility] ORS;  Service: Ophthalmology;  Laterality: Right;  CDE:9.96  . CATARACT EXTRACTION W/PHACO Left 11/13/2018   Procedure: CATARACT EXTRACTION PHACO AND INTRAOCULAR LENS PLACEMENT (IOC);  Surgeon: Baruch Goldmann, MD;  Location: [facility] ORS;  Service: Ophthalmology;  Laterality: Left;  left, CDE: 7.44  . CHOLECYSTECTOMY N/A 02/14/2015   Procedure: LAPAROSCOPIC CHOLECYSTECTOMY;  Surgeon: Aviva Signs, MD;  Location:  [facility] ORS;  Service: General;  Laterality: N/A;  . COLONOSCOPY N/A 04/22/2018   Procedure: COLONOSCOPY;  Surgeon: Aviva Signs, MD;  [facility];  Service: Gastroenterology;  Laterality: N/A;  . COLONOSCOPY WITH PROPOFOL N/A 09/06/2019   Procedure: COLONOSCOPY WITH PROPOFOL;  Surgeon: Daneil Dolin, MD;  [facility];  Service: Endoscopy;  Laterality: N/A;  . ESOPHAGEAL DILATION  09/06/2019    Procedure: ESOPHAGEAL DILATION;  Surgeon: Daneil Dolin, MD;  [facility];  Service: Endoscopy;;  . ESOPHAGOGASTRODUODENOSCOPY N/A 04/22/2018   Procedure: ESOPHAGOGASTRODUODENOSCOPY (EGD);  Surgeon: Aviva Signs, MD;  [facility];  Service: Gastroenterology;  Laterality: N/A;  . ESOPHAGOGASTRODUODENOSCOPY (EGD) WITH PROPOFOL N/A 09/06/2019   Procedure: ESOPHAGOGASTRODUODENOSCOPY (EGD) WITH PROPOFOL;  Surgeon: Daneil Dolin, MD;  [facility];  Service: Endoscopy;  Laterality: N/A;  . GIVENS CAPSULE STUDY N/A 10/31/2019   Procedure: GIVENS CAPSULE STUDY;  Surgeon: Harvel Quale, MD;  [facility];  Service: Gastroenterology;  Laterality: N/A;  . MASTECTOMY     bilateral mastectomy-right breast cancer-left taken by choice  . POLYPECTOMY  04/22/2018   Procedure: POLYPECTOMY;  Surgeon: Aviva Signs, MD;  [facility];  Service: Gastroenterology;;  . POLYPECTOMY  09/06/2019   Procedure: POLYPECTOMY;  Surgeon: Daneil Dolin, MD;  [facility];  Service: Endoscopy;;  cecal, ascending, hepatic flexure, sigmoid  . RIGHT HEART CATH N/A 11/09/2019   Procedure: RIGHT HEART CATH;  Surgeon: Jolaine Artist, MD;  Location: [place] [facility];  Service: Cardiovascular;  Laterality: N/A;  . SEPTOPLASTY  1995     OB History   No obstetric history on file.     Family History  Problem Relation Age of Onset  . Cancer Mother   . Aneurysm Father   . Cancer Sister   . Cancer Sister   . Anesthesia problems Neg Hx   . Hypotension Neg Hx   . Malignant hyperthermia Neg Hx   . Pseudochol deficiency Neg Hx   . Colon cancer Neg Hx     Social History   Tobacco Use  . Smoking status: Former Smoker    Packs/day: 1.00    Years: 28.00    Pack years: 28.00    Types: Cigarettes    Quit date: 07/17/1983    Years since quitting: 36.4  . Smokeless tobacco: Never Used  Vaping Use  . Vaping Use: Never used  Substance Use Topics   . Alcohol use: No  . Drug use: No    Home Medications Prior to Admission medications   Medication Sig Start Date End Date Taking? Authorizing Provider  acetaminophen (TYLENOL) 650 MG CR tablet Take 650 mg by mouth every 8 (eight) hours as needed for pain. Patient not taking: Reported on 11/26/2019    [provider]  albuterol (VENTOLIN HFA) 108 (90 Base) MCG/ACT inhaler Inhale 1-2 puffs into the lungs every 6 (six) hours as needed for wheezing or shortness of breath. Patient not taking: Reported on 11/26/2019    [provider]  ALPRAZolam Duanne Moron) 0.5 MG tablet Take 0.5 mg by mouth at bedtime.  06/18/14   [provider]  bisoprolol (ZEBETA) 5 MG tablet Take 0.5 tablets (2.5 mg total) by mouth daily. 12/01/19   Larey Dresser, MD  calcium citrate-vitamin D (CITRACAL+D) 315-200 MG-UNIT tablet Take 1 tablet by mouth 2 (two) times daily.    [provider]  cetirizine HCl (ZYRTEC) 1 MG/ML solution Take  5 mLs (5 mg total) by mouth daily. 10/22/19   Wurst, [place], PA-C  epoetin alfa (EPOGEN) 3000 UNIT/ML injection Inject into the skin. 11/19/19   [provider]  fluticasone (FLONASE) 50 MCG/ACT nasal spray Place 2 sprays into both nostrils daily. 10/22/19   Wurst, [place], PA-C  loperamide (IMODIUM) 2 MG capsule Take 1 capsule (2 mg total) by mouth every 8 (eight) hours as needed for diarrhea or loose stools. Patient not taking: Reported on 11/26/2019 11/02/19   Roxan Hockey, MD  omeprazole (PRILOSEC) 40 MG capsule Take 40 mg by mouth as needed.    [provider]  potassium chloride SA (KLOR-CON) 20 MEQ tablet Take 1 tablet (20 mEq total) by mouth 2 (two) times daily. Start on 09/28/2019 when you restart Lasix 11/02/19   Larey Dresser, MD  predniSONE (DELTASONE) 10 MG tablet Take 4 tablets (40 mg total) by mouth daily for 5 days. 12/04/19 12/09/19  Tacy Learn, PA-C  torsemide (DEMADEX) 20 MG tablet Take 3 tablets (60 mg total) by mouth 2 (two)  times daily. 12/01/19   Larey Dresser, MD    Allergies    Cardizem cd [diltiazem hcl er beads], Amiodarone, Metoprolol tartrate, Sulfa antibiotics, Tomato, Carvedilol, and Chocolate  Review of Systems   Review of Systems  Constitutional: Negative for fever.  HENT: Positive for trouble swallowing.   Respiratory: Positive for chest tightness, shortness of breath and wheezing.   Cardiovascular: Positive for palpitations. Negative for chest pain.  Gastrointestinal: Positive for nausea and vomiting.  Musculoskeletal: Negative for arthralgias and myalgias.  Skin: Negative for rash.  Neurological: Negative for weakness.  Psychiatric/Behavioral: Negative for confusion.  All other systems reviewed and are negative.   Physical Exam Updated Vital Signs BP (!) 101/59   Pulse 100   Temp (!) 97.5 F (36.4 C) (Oral)   Resp 16   Ht 5\' 4"  (1.626 m)   Wt 55.8 kg   SpO2 96%   BMI 21.11 kg/m   Physical Exam Vitals and nursing note reviewed.  Constitutional:      General: She is not in acute distress.    Appearance: She is well-developed. She is not diaphoretic.  HENT:     Head: Normocephalic and atraumatic.     Mouth/Throat:     Mouth: Mucous membranes are moist.     Pharynx: Uvula swelling present. No oropharyngeal exudate or posterior oropharyngeal erythema.  Eyes:     Conjunctiva/sclera: Conjunctivae normal.  Cardiovascular:     Rate and Rhythm: Normal rate. Rhythm irregular.     Heart sounds: Normal heart sounds.  Pulmonary:     Effort: Pulmonary effort is normal.     Breath sounds: Wheezing present.  Abdominal:     Palpations: Abdomen is soft.     Tenderness: There is no abdominal tenderness.  Skin:    General: Skin is warm and dry.     Findings: No erythema or rash.  Neurological:     Mental Status: She is alert and oriented to person, place, and time.  Psychiatric:        Behavior: Behavior normal.     ED Results / Procedures / Treatments   Labs (all labs ordered  are listed, but only abnormal results are displayed) Labs Reviewed - No data to display  EKG None  Radiology No results found.  Procedures Procedures (including critical care time)  Medications Ordered in ED Medications  famotidine (PEPCID) IVPB 20 mg premix (20 mg Intravenous Not Given 12/04/19  1524)  albuterol (VENTOLIN HFA) 108 (90 Base) MCG/ACT inhaler 2 puff (2 puffs Inhalation Not Given 12/04/19 1524)  AeroChamber Plus Flo-Vu Medium MISC 1 each (1 each Other Not Given 12/04/19 1524)    ED Course  I have reviewed the triage vital signs and the nursing notes.  Pertinent labs & imaging results that were available during my care of the patient were reviewed by me and considered in my medical decision making (see chart for details).  Clinical Course as of Dec 03 1601  Fri Dec 04, 6330  3011 79 year old female brought to the ER from the cancer center for reaction to iron infusion. Patient was given Solumedrol, Benadryl, Zofran with improvement of symptoms. On exam, mild wheezing on exam with mild swelling of the uvula.  Ordered albuterol inhaler as well as pepcid.  Notified by nursing staff patient had not received these medications, was feeling better and ready for dc. On recheck, symptoms have resolved, patient would like to be dc. Advised to follow up with her PCP, return to the ER for worsening or concerning symptoms. Also recommend she contact her cardiologist to discuss her Eliquis, states she was told she no longer needs this but is uncertain.    [LM]  1603 EKG reviewed with Dr. [name], ER attending, a fib, not RVR.   [LM]    Clinical Course User Index [LM] Roque Lias   MDM Rules/Calculators/A&P                          Final Clinical Impression(s) / ED Diagnoses Final diagnoses:  Allergic reaction, initial encounter    Rx / DC Orders ED Discharge Orders         Ordered    predniSONE (DELTASONE) 10 MG tablet  Daily        12/04/19 1538             Tacy Learn, PA-C 12/04/19 1603    Milton Ferguson, MD 12/05/19 1445

## 2019-12-04 NOTE — Progress Notes (Signed)
At 1250 pt and husband rang the call bell and pt c/o of SOB and then had nausea with vomiting. Dr. Raliegh Ip gave verbal orders to given Benadryl 25mg  IV, Zofran 8mg  ODT, and Solu-medrol 125mg  IV due to possible allergic reaction. At 1300 PT states she still has the SOB but nausea has subsided and feels anxious. 1315 PT still complaining of SOB and oxygen saturation now 84%. PT put on 4L oxygen via n/c and sats increased to 89% but respirations were still 26. AT 1340 pt was taken to the ED for further eval due to tachycardia and tachypnea requiring oxygen. Report given to [facility] (triage nurse) and Dr. [name] EDP at that time. PT put into ED room 3 and put on cardiac monitor. Husband left with pt at bedside. IV in place and saline locked for ED use.

## 2019-12-04 NOTE — ED Notes (Signed)
RN entered room to give Pepcid and albuterol treatment. Pt informed RN she "felt a lot better and is ready to go home". RN explained medications and need for them to pt and pt still refused. Will notify provider.

## 2019-12-07 ENCOUNTER — Encounter (HOSPITAL_COMMUNITY): Payer: Self-pay

## 2019-12-07 ENCOUNTER — Encounter (HOSPITAL_COMMUNITY)
Admission: RE | Admit: 2019-12-07 | Discharge: 2019-12-07 | Disposition: A | Discharge status: Home / Self Care | Payer: Medicare Other | Source: Ambulatory Visit | Attending: Nephrology | Admitting: Nephrology

## 2019-12-07 ENCOUNTER — Encounter: Payer: Self-pay | Admitting: Pharmacist

## 2019-12-07 ENCOUNTER — Other Ambulatory Visit: Payer: Self-pay

## 2019-12-07 DIAGNOSIS — N184 Chronic kidney disease, stage 4 (severe): Secondary | ICD-10-CM | POA: Diagnosis not present

## 2019-12-07 DIAGNOSIS — D631 Anemia in chronic kidney disease: Secondary | ICD-10-CM | POA: Diagnosis not present

## 2019-12-07 HISTORY — DX: Anemia, unspecified: D64.9

## 2019-12-07 LAB — POCT HEMOGLOBIN-HEMACUE: Hemoglobin: 10.1 g/dL — ABNORMAL LOW (ref 12.0–15.0)

## 2019-12-07 MED ORDER — EPOETIN ALFA 3000 UNIT/ML IJ SOLN: 3000.0000 [IU] | Freq: Once | INTRAMUSCULAR | Status: DC

## 2019-12-08 ENCOUNTER — Ambulatory Visit (HOSPITAL_COMMUNITY): Payer: Medicare Other | Admitting: Hematology

## 2019-12-08 LAB — METHYLMALONIC ACID, SERUM: Methylmalonic Acid, Quantitative: 2175 nmol/L — ABNORMAL HIGH (ref 0–378)

## 2019-12-09 NOTE — Research (Addendum)
                6 Minute Walk Was the 6 minute walk performed:    [x]  Yes  [] No (Complete Protocol Deviation)  Vitals Before 6 Minute Walk Systolic Blood Pressure (mmHg) Diastolic Blood Pressure (mmHg) Heart Rate (BPM) Respiration Rate (BPM)   102 58 90 22  Vitals After 6 Minute Walk Systolic Blood Pressure (mmHg) Diastolic Blood Pressure (mmHg) Heart Rate (BPM) Respiration Rate (BPM)   115 65 98 22  Measured Before 6 Minute Walk Borg Scale Level of Shortness of breath: Grade 5 - Severe (heavy)  Borg Scale Level of Fatigue: Grade 5 - Severe (heavy)  Measured After 6 Minute Walk Borg Scale Level of Shortness of breath: Grade 7 - Very Severe  Borg Scale Level of Fatigue: Grade 7 - Very Severe   Distance covered in 6 minutes  37 : meters    Did the subject stop or pause before 6 minutes   [x] Yes (Complete Below)  [] No   Reason for stopping Reason Response If Yes, Comments   Shortness of breath (HF-related) Yes[x]   No[]  yes   Light headedness (HF-related) Yes[]   No[x]     Fatigue (HF-related) Yes[x]   No[]  yes   Other HF-related symptom Yes[]   No[x]     Non-HF symptom(s) Yes[]   No[x]

## 2019-12-09 NOTE — Research (Signed)
  Avoiding Treatment in the Hospital with Furoscix for the Management of Congestion in Heart Failure - A Pilot Study  AT HOME-HF Pilot       [x] Informed Consent (done before any of the following procedures were completed)  [x] Limited Physical Exam   [x] NYHA Classification  [x] Eligibility Criteria  [x] Medical History and Subject Demographics   [x] Administration of the Composite Congestion Score (CCS)  [x] Administration of the 5-Point Current Dyspnea Score  [x] Weight & Height  [x] Vital Signs  [x] % Lung Fluid Measurement (ReDS)  [x] Eligibility Criteria Blood work performed along with NT-ProBNP   [] Urine Pregnancy Test on females of childbearing potential  [x] Administration of the [place] Cardiomyopathy Questionnaire (KCCQ-12)  [x] Administration of the patient global assessment visual analog scale (VAS)  [x] Administration of the six minute walk test  [x] Assessment of any Adverse Events  [x] Review of concomitant medications   [x] Scheduling next study visit  

## 2019-12-09 NOTE — Research (Signed)
  [employer] Protocol [id number] AT [name] Pilot  Patient Global Assessment Visual Analog Scale (EQ-VAS)        Subject Number:   16-001  Date Completed:        November 02, 2019  Study Day:   Day 0       . We would like to know how good or bad your health is TODAY. Marland Kitchen This scale is numbered 0 to 100. . 100 means the best health you can imagine.       0 means the worst health you can imagine. . Please let me know the number you feel indicated how your health is TODAY.    VAS SCORE - YOUR HEALTH TODAY : 45

## 2019-12-09 NOTE — Research (Signed)
            Composite Congestion Score (CCS) Indicate the subject's current CCS Score at the present time (select for each signs/symptoms) Was CCS Performed:   ?  [x]Yes ?  []No (Complete Protocol Deviation)     Signs/Symptoms 0 - None  1 -Seldom 2 -Frequent 3 -Continuous  Dyspnoea [] [] [x] []  Orthopnoea [] [] [x] []  Fatigue [] [] [x] []   Signs/Symptoms 0 - <=6  1 - 6-9 2 - 10-15 3 - >=15  JVD (cm H2O) [x] [] [] []   Signs/Symptoms 0 -None  1 -Bases 2 -To <50% 3 -To >50%  Rales [x] [] [] []   Signs/Symptoms 0 -Absent/ trace  1 -Slight 2 -Moderate 3 -Marked  Oedema [x] [] [] []   

## 2019-12-17 ENCOUNTER — Encounter: Payer: Medicare Other | Admitting: Genetic Counselor

## 2019-12-21 ENCOUNTER — Encounter (HOSPITAL_COMMUNITY)
Admission: RE | Admit: 2019-12-21 | Discharge: 2019-12-21 | Disposition: A | Discharge status: Home / Self Care | Payer: Medicare Other | Source: Ambulatory Visit | Attending: Nephrology | Admitting: Nephrology

## 2019-12-21 ENCOUNTER — Encounter (HOSPITAL_COMMUNITY): Admission: RE | Admit: 2019-12-21 | Payer: Medicare Other | Source: Ambulatory Visit

## 2019-12-21 ENCOUNTER — Other Ambulatory Visit: Payer: Self-pay

## 2019-12-21 ENCOUNTER — Encounter (HOSPITAL_COMMUNITY): Payer: Self-pay

## 2019-12-21 DIAGNOSIS — D631 Anemia in chronic kidney disease: Secondary | ICD-10-CM | POA: Diagnosis not present

## 2019-12-21 DIAGNOSIS — N184 Chronic kidney disease, stage 4 (severe): Secondary | ICD-10-CM | POA: Diagnosis not present

## 2019-12-21 LAB — POCT HEMOGLOBIN-HEMACUE: Hemoglobin: 11.4 g/dL — ABNORMAL LOW (ref 12.0–15.0)

## 2019-12-21 MED ORDER — EPOETIN ALFA 3000 UNIT/ML IJ SOLN: 3000.0000 [IU] | Freq: Once | INTRAMUSCULAR | Status: DC

## 2019-12-24 ENCOUNTER — Inpatient Hospital Stay (HOSPITAL_COMMUNITY): Payer: Medicare Other

## 2019-12-24 ENCOUNTER — Inpatient Hospital Stay (HOSPITAL_BASED_OUTPATIENT_CLINIC_OR_DEPARTMENT_OTHER): Payer: Medicare Other | Admitting: Hematology

## 2019-12-24 ENCOUNTER — Other Ambulatory Visit: Payer: Self-pay

## 2019-12-24 VITALS — BP 137/76 | HR 98 | Temp 97.3°F | Resp 16 | Wt 125.0 lb

## 2019-12-24 DIAGNOSIS — D649 Anemia, unspecified: Secondary | ICD-10-CM | POA: Diagnosis not present

## 2019-12-24 DIAGNOSIS — Z87891 Personal history of nicotine dependence: Secondary | ICD-10-CM | POA: Diagnosis not present

## 2019-12-24 DIAGNOSIS — I509 Heart failure, unspecified: Secondary | ICD-10-CM | POA: Diagnosis not present

## 2019-12-24 DIAGNOSIS — N184 Chronic kidney disease, stage 4 (severe): Secondary | ICD-10-CM | POA: Diagnosis not present

## 2019-12-24 DIAGNOSIS — K219 Gastro-esophageal reflux disease without esophagitis: Secondary | ICD-10-CM | POA: Diagnosis not present

## 2019-12-24 DIAGNOSIS — Z853 Personal history of malignant neoplasm of breast: Secondary | ICD-10-CM | POA: Diagnosis not present

## 2019-12-24 DIAGNOSIS — Z79899 Other long term (current) drug therapy: Secondary | ICD-10-CM | POA: Diagnosis not present

## 2019-12-24 DIAGNOSIS — Z9221 Personal history of antineoplastic chemotherapy: Secondary | ICD-10-CM | POA: Diagnosis not present

## 2019-12-24 DIAGNOSIS — E538 Deficiency of other specified B group vitamins: Secondary | ICD-10-CM | POA: Diagnosis not present

## 2019-12-24 DIAGNOSIS — I110 Hypertensive heart disease with heart failure: Secondary | ICD-10-CM | POA: Diagnosis not present

## 2019-12-24 DIAGNOSIS — I482 Chronic atrial fibrillation, unspecified: Secondary | ICD-10-CM | POA: Diagnosis not present

## 2019-12-24 DIAGNOSIS — Z803 Family history of malignant neoplasm of breast: Secondary | ICD-10-CM | POA: Diagnosis not present

## 2019-12-24 DIAGNOSIS — Z9013 Acquired absence of bilateral breasts and nipples: Secondary | ICD-10-CM | POA: Diagnosis not present

## 2019-12-24 LAB — HEMOGLOBIN AND HEMATOCRIT, BLOOD
HCT: 37.9 % (ref 36.0–46.0)
Hemoglobin: 11.7 g/dL — ABNORMAL LOW (ref 12.0–15.0)

## 2019-12-24 MED ORDER — CYANOCOBALAMIN 1000 MCG/ML IJ SOLN
1000.0000 ug | Freq: Once | INTRAMUSCULAR | Status: AC
  Administered 2019-12-24: 1000 ug via INTRAMUSCULAR

## 2019-12-24 MED ORDER — CYANOCOBALAMIN 1000 MCG/ML IJ SOLN
INTRAMUSCULAR | Status: AC
  Filled 2019-12-24: qty 1

## 2019-12-24 NOTE — Progress Notes (Signed)
[place], [place] [address]   CLINIC:  Medical Oncology/Hematology  PCP:  [name], MD [address] Bailey [place] [address]  [phone]  REASON FOR VISIT:  Follow-up for normocytic anemia in a Jehovah witness  PRIOR THERAPY: None  CURRENT THERAPY: Intermittent Feraheme  INTERVAL HISTORY:  Bailey Bishop, a 79 y.o. female, returns for routine follow-up for her normocytic anemia in a Jehovah witness. Bailey Bishop was last seen on 11/26/2019.  Today she is accompanied by her husband. She reports feeling well and denies having hematochezia, melena or hematuria. She is taking multivitamins with iron in them. She is not currently taking vitamin B12. She had a reaction to [facility] on 9/10 and was sent to [facility], but left after 2 hours, and is feeling better after getting the iron infusion.  She is inquiring about getting a flu vaccine and is scheduled for her vaccine tomorrow.   REVIEW OF SYSTEMS:  Review of Systems  Constitutional: Positive for appetite change (75%) and fatigue (75%).  Cardiovascular: Positive for chest pain (w/ exertion).  Gastrointestinal: Negative for blood in stool.  Genitourinary: Negative for hematuria.   Neurological: Positive for dizziness (occasional).  Psychiatric/Behavioral: Positive for sleep disturbance (occasional).    PAST MEDICAL/SURGICAL HISTORY:  Past Medical History:  Diagnosis Date  . Anemia   . Anxiety   . Arthritis   . Asthma   . Atrial fibrillation ([place])    a. s/p unsuccessful DCCV in 01/2017 and intolerant to Amiodarone --> Rate-control strategy pursued since b. failed Tikosyn in 11/2017  . Breast cancer ([place]) 2001   Right  . CHF (congestive heart failure) ([place])   . Essential hypertension   . GERD (gastroesophageal reflux disease)   . T10 vertebral fracture [facility])    Past Surgical History:  Procedure Laterality Date  . BIOPSY N/A 04/22/2018   Procedure: BIOPSY;  Surgeon: Aviva Signs, MD;  [facility];  Service: Gastroenterology;  Laterality: N/A;  . BIOPSY  09/06/2019   Procedure: BIOPSY;  Surgeon: Daneil Dolin, MD;  [facility];  Service: Endoscopy;;  gastric  . CARDIOVERSION N/A 02/18/2017   Procedure: CARDIOVERSION;  Surgeon: Josue Hector, MD;  Location: [name] ENDOSCOPY;  Service: Cardiovascular;  Laterality: N/A;  . CARDIOVERSION N/A 12/04/2017   Procedure: CARDIOVERSION;  Surgeon: [name], MD;  Location: [facility];  Service: Cardiovascular;  Laterality: N/A;  . CATARACT EXTRACTION W/PHACO  07/23/2011   Procedure: CATARACT EXTRACTION PHACO AND INTRAOCULAR LENS PLACEMENT ([place]);  Surgeon: Williams Che, MD;  [facility];  Service: Ophthalmology;  Laterality: Right;  CDE:9.96  . CATARACT EXTRACTION W/PHACO Left 11/13/2018   Procedure: CATARACT EXTRACTION PHACO AND INTRAOCULAR LENS PLACEMENT (IOC);  Surgeon: Baruch Goldmann, MD;  [facility];  Service: Ophthalmology;  Laterality: Left;  left, CDE: 7.44  . CHOLECYSTECTOMY N/A 02/14/2015   Procedure: LAPAROSCOPIC CHOLECYSTECTOMY;  Surgeon: Aviva Signs, MD;  [facility];  Service: General;  Laterality: N/A;  . COLONOSCOPY N/A 04/22/2018   Procedure: COLONOSCOPY;  Surgeon: Aviva Signs, MD;  [facility];  Service: Gastroenterology;  Laterality: N/A;  . COLONOSCOPY WITH PROPOFOL N/A 09/06/2019   Procedure: COLONOSCOPY WITH PROPOFOL;  Surgeon: Daneil Dolin, MD;  [facility];  Service: Endoscopy;  Laterality: N/A;  . ESOPHAGEAL DILATION  09/06/2019   Procedure: ESOPHAGEAL DILATION;  Surgeon: Daneil Dolin, MD;  [facility];  Service: Endoscopy;;  .  ESOPHAGOGASTRODUODENOSCOPY N/A 04/22/2018   Procedure: ESOPHAGOGASTRODUODENOSCOPY (EGD);  Surgeon: Aviva Signs, MD;  [facility];  Service: Gastroenterology;  Laterality: N/A;  . ESOPHAGOGASTRODUODENOSCOPY (EGD) WITH PROPOFOL N/A 09/06/2019   Procedure:  ESOPHAGOGASTRODUODENOSCOPY (EGD) WITH PROPOFOL;  Surgeon: Daneil Dolin, MD;  [facility];  Service: Endoscopy;  Laterality: N/A;  . GIVENS CAPSULE STUDY N/A 10/31/2019   Procedure: GIVENS CAPSULE STUDY;  Surgeon: Harvel Quale, MD;  [facility];  Service: Gastroenterology;  Laterality: N/A;  . MASTECTOMY     bilateral mastectomy-right breast cancer-left taken by choice  . POLYPECTOMY  04/22/2018   Procedure: POLYPECTOMY;  Surgeon: Aviva Signs, MD;  [facility];  Service: Gastroenterology;;  . POLYPECTOMY  09/06/2019   Procedure: POLYPECTOMY;  Surgeon: Daneil Dolin, MD;  [facility];  Service: Endoscopy;;  cecal, ascending, hepatic flexure, sigmoid  . RIGHT HEART CATH N/A 11/09/2019   Procedure: RIGHT HEART CATH;  Surgeon: Jolaine Artist, MD;  Location: [place] CV LAB;  Service: Cardiovascular;  Laterality: N/A;  . SEPTOPLASTY  1995    SOCIAL HISTORY:  Social History   Socioeconomic History  . Marital status: Married    Spouse name: [name]  . Number of children: 1  . Years of education: Not on file  . Highest education level: Not on file  Occupational History  . Occupation: retired  Tobacco Use  . Smoking status: Former Smoker    Packs/day: 1.00    Years: 28.00    Pack years: 28.00    Types: Cigarettes    Quit date: 07/17/1983    Years since quitting: 36.4  . Smokeless tobacco: Never Used  Vaping Use  . Vaping Use: Never used  Substance and Sexual Activity  . Alcohol use: No  . Drug use: No  . Sexual activity: Not on file  Other Topics Concern  . Not on file  Social History Narrative  . Not on file   Social Determinants of Health   Financial Resource Strain:   . Difficulty of Paying Living Expenses: Not on file  Food Insecurity: No Food Insecurity  . Worried About Charity fundraiser in the Last Year: Never true  . Ran Out of Food in the Last Year: Never true  Transportation Needs: No  Transportation Needs  . Lack of Transportation (Medical): No  . Lack of Transportation (Non-Medical): No  Physical Activity:   . Days of Exercise per Week: Not on file  . Minutes of Exercise per Session: Not on file  Stress:   . Feeling of Stress : Not on file  Social Connections: Moderately Integrated  . Frequency of Communication with Friends and Family: Once a week  . Frequency of Social Gatherings with Friends and Family: Once a week  . Attends Religious Services: 1 to 4 times per year  . Active Member of Clubs or Organizations: No  . Attends Archivist Meetings: 1 to 4 times per year  . Marital Status: Married  Human resources officer Violence:   . Fear of Current or Ex-Partner: Not on file  . Emotionally Abused: Not on file  . Physically Abused: Not on file  . Sexually Abused: Not on file    FAMILY HISTORY:  Family History  Problem Relation Age of Onset  . Cancer Mother   . Aneurysm Father   . Cancer Sister   . Cancer Sister   . Anesthesia problems Neg Hx   . Hypotension Neg Hx   .  Malignant hyperthermia Neg Hx   . Pseudochol deficiency Neg Hx   . Colon cancer Neg Hx     CURRENT MEDICATIONS:  Current Outpatient Medications  Medication Sig Dispense Refill  . ALPRAZolam (XANAX) 0.5 MG tablet Take 0.5 mg by mouth at bedtime.     . bisoprolol (ZEBETA) 5 MG tablet Take 0.5 tablets (2.5 mg total) by mouth daily. 45 tablet 3  . calcium citrate-vitamin D (CITRACAL+D) 315-200 MG-UNIT tablet Take 1 tablet by mouth 2 (two) times daily.    . cetirizine HCl (ZYRTEC) 1 MG/ML solution Take 5 mLs (5 mg total) by mouth daily. 60 mL 0  . epoetin alfa (EPOGEN) 3000 UNIT/ML injection Inject into the skin.    . fluticasone (FLONASE) 50 MCG/ACT nasal spray Place 2 sprays into both nostrils daily. 16 g 0  . loperamide (IMODIUM) 2 MG capsule Take 1 capsule (2 mg total) by mouth every 8 (eight) hours as needed for diarrhea or loose stools. 15 capsule 0  . omeprazole (PRILOSEC) 40 MG  capsule Take 40 mg by mouth as needed.    . potassium chloride SA (KLOR-CON) 20 MEQ tablet Take 1 tablet (20 mEq total) by mouth 2 (two) times daily. Start on 09/28/2019 when you restart Lasix 60 tablet 3  . torsemide (DEMADEX) 20 MG tablet Take 3 tablets (60 mg total) by mouth 2 (two) times daily. 540 tablet 3  . acetaminophen (TYLENOL) 650 MG CR tablet Take 650 mg by mouth every 8 (eight) hours as needed for pain.  (Patient not taking: Reported on 12/24/2019)    . albuterol (VENTOLIN HFA) 108 (90 Base) MCG/ACT inhaler Inhale 1-2 puffs into the lungs every 6 (six) hours as needed for wheezing or shortness of breath.  (Patient not taking: Reported on 12/24/2019)     No current facility-administered medications for this visit.    ALLERGIES:  Allergies  Allergen Reactions  . Feraheme [Ferumoxytol] Shortness Of Breath  . Cardizem Cd [Diltiazem Hcl Er Beads] Diarrhea  . Amiodarone Nausea And Vomiting  . Metoprolol Tartrate Diarrhea  . Sulfa Antibiotics Nausea And Vomiting  . Tomato Other (See Comments)    Burns stomach  . Carvedilol Diarrhea and Rash  . Chocolate Rash and Other (See Comments)    Dark chocolate    PHYSICAL EXAM:  Performance status (ECOG): 1 - Symptomatic but completely ambulatory  Vitals:   12/24/19 1505  BP: 137/76  Pulse: 98  Resp: 16  Temp: (!) 97.3 F (36.3 C)  SpO2: 97%   Wt Readings from Last 3 Encounters:  12/24/19 125 lb (56.7 kg)  12/21/19 123 lb 0.3 oz (55.8 kg)  12/04/19 123 lb (55.8 kg)   Physical Exam  LABORATORY DATA:  I have reviewed the labs as listed.  CBC Latest Ref Rng & Units 12/24/2019 12/21/2019 12/07/2019  WBC 4.0 - 10.5 K/uL - - -  Hemoglobin 12.0 - 15.0 g/dL 11.7(L) 11.4(L) 10.1(L)  Hematocrit 36 - 46 % 37.9 - -  Platelets 150 - 400 K/uL - - -   CMP Latest Ref Rng & Units 11/26/2019 11/26/2019 11/11/2019  Glucose 70 - 99 mg/dL 97 - 98  BUN 8 - 23 mg/dL 32(H) - 16  Creatinine 0.44 - 1.00 mg/dL 2.44(H) - 2.11(H)  Sodium 135 - 145 mmol/L  135 - 139  Potassium 3.5 - 5.1 mmol/L 3.7 - 3.9  Chloride 98 - 111 mmol/L 96(L) - 99  CO2 22 - 32 mmol/L 29 - 33(H)  Calcium 8.7 - 10.3 mg/dL 9.1  9.4 8.9  Total Protein 6.5 - 8.1 g/dL 7.8 - -  Total Bilirubin 0.3 - 1.2 mg/dL 1.3(H) - -  Alkaline Phos 38 - 126 U/L 98 - -  AST 15 - 41 U/L 26 - -  ALT 0 - 44 U/L 12 - -      Component Value Date/Time   RBC 3.59 (L) 11/26/2019 0925   RBC 3.58 (L) 11/26/2019 0925   MCV 91.6 11/26/2019 0925   MCH 27.4 11/26/2019 0925   MCHC 29.9 (L) 11/26/2019 0925   RDW 25.9 (H) 11/26/2019 0925   LYMPHSABS 1.2 11/26/2019 0925   MONOABS 0.6 11/26/2019 0925   EOSABS 0.3 11/26/2019 0925   BASOSABS 0.0 11/26/2019 0925   Lab Results  Component Value Date   TIBC 353 11/26/2019   TIBC 374 11/04/2019   TIBC 458 (H) 10/08/2019   FERRITIN 47 11/26/2019   FERRITIN 311 (H) 11/04/2019   FERRITIN 9 (L) 10/29/2019   IRONPCTSAT 19 11/26/2019   IRONPCTSAT 20 11/04/2019   IRONPCTSAT 4 (L) 10/08/2019   Lab Results  Component Value Date   TOTALPROTELP 7.0 11/26/2019   ALBUMINELP 3.5 11/26/2019   A1GS 0.3 11/26/2019   A2GS 0.6 11/26/2019   BETS 1.0 11/26/2019   GAMS 1.6 11/26/2019   MSPIKE Not Observed 11/26/2019   SPEI Comment 11/26/2019    Lab Results  Component Value Date   KPAFRELGTCHN 118.0 (H) 10/08/2019   LAMBDASER 73.4 (H) 10/08/2019   KAPLAMBRATIO 1.61 10/08/2019    DIAGNOSTIC IMAGING:  I have independently reviewed the scans and discussed with the patient. No results found.   ASSESSMENT:  1.  Normocytic anemia: -Recent admission to the hospital with anemia.  On 10/29/2019, ferritin was 9. -Received Feraheme on 10/31/2019. 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  Complains of fatigue.  Denies any bleeding per rectum or melena. -Eliquis discontinued 3 weeks ago. -Patient started taking iron tablet daily last week. -Colonoscopy on 09/06/2019 shows diverticulosis in the sigmoid and descending colon.  Eight 4-8 mm polyps in the sigmoid colon, descending  colon at hepatic flexure and in the cecum.  13 mm polyp in the descending colon. -EGD on 09/06/2019 showed mild Schatzki ring, dilated.  There is a gastropathy with no stigmata of bleeding.  Normal duodenum. -SPEP negative, methylmalonic acid elevated.  LDH normal. -Received Feraheme on 12/04/2019 with reaction.  2.  Right breast cancer: -Reportedly diagnosed 21 years ago and underwent bilateral mastectomy followed by 6 months of chemotherapy.  3.  Social/family history: -She worked in Charity fundraiser.  Quit smoking at age 19.  Smoked half pack to 1 pack/day for 25 years. -1 sister died of metastatic breast cancer.  Another sister had breast cancer.   PLAN:  1.  Normocytic anemia: -CBC on 11/26/2019 showed 9.8 hemoglobin.  Ferritin was 47 and percent saturation 19.  Methylmalonic acid was 2175.  SPEP was negative. -She received Feraheme on 910 2021 but ended up with a reaction and had to go to the ER. -However her hemoglobin improved to 11.7. -No active intervention.  RTC 3 months with repeat labs. -If we need parenteral iron therapy in the future, consider Venofer.  2.  CKD: -Latest creatinine is 2.4.  Follow-up with Dr. Theador Hawthorne.  3.  Atrial fibrillation: -Eliquis was discontinued.  4.  Family history: -I have recommended genetic testing because of personal history and family history of breast cancer.  5.  Vitamin B12 deficiency: -Methylmalonic acid was elevated.  Vitamin B12 injection today. -She will take vitamin B12 1  mg tablet daily.  Orders placed this encounter:  No orders of the defined types were placed in this encounter.    Derek Jack, MD [place] [phone]   I, Milinda Antis, am acting as a scribe for Dr. Sanda Linger.  I, Derek Jack MD, have reviewed the above documentation for accuracy and completeness, and I agree with the above.

## 2019-12-24 NOTE — Patient Instructions (Signed)
[place] at [name] Discharge Instructions  You were seen today by [name]. He went over your recent results. You received a vitamin B12 injection today; purchase vitamin B12 over the counter and take 1 mg every day. [name] will see you back in 3 months for labs and follow up.   Thank you for choosing [place] at [name] to provide your oncology and hematology care.  To afford each patient quality time with our provider, please arrive at least 15 minutes before your scheduled appointment time.   If you have a lab appointment with the [facility] please come in thru the Main Entrance and check in at the main information desk  You need to re-schedule your appointment should you arrive 10 or more minutes late.  We strive to give you quality time with our providers, and arriving late affects you and other patients whose appointments are after yours.  Also, if you no show three or more times for appointments you may be dismissed from the clinic at the providers discretion.     Again, thank you for choosing [facility].  Our hope is that these requests will decrease the amount of time that you wait before being seen by our physicians.       _____________________________________________________________  Should you have questions after your visit to [name], please contact our office at [phone] between the hours of 8:00 a.m. and 4:30 p.m.  Voicemails left after 4:00 p.m. will not be returned until the following business day.  For prescription refill requests, have your pharmacy contact our office and allow 72 hours.    Cancer Center Support Programs:   > Cancer Support Group  2nd Tuesday of the month 1pm-2pm, Journey Room

## 2019-12-24 NOTE — Progress Notes (Signed)
Patient given vitamin B-12 injection per MD orders, see MAR for administration details. Patient discharged ambulatory and in stable condition.  Follow up as scheduled.

## 2019-12-24 NOTE — Progress Notes (Addendum)
Cardiology Office Note  Date: 12/25/2019   ID: Bailey Bishop, DOB [birth year], MRN [id number]  PCP:  [name], MD  Cardiologist:  [name], MD Electrophysiologist:  [name], MD   Chief Complaint: Dyspnea on exertion/shortness of breath, persistent atrial fibrillation, HTN, HFrEF, Stage IV Renal disease, anemia likely secondary to renal disease.  RV failure.  Recent right heart cath and echocardiogram.    History of Present Illness: Bailey Bishop is a 79 y.o. female with a history of chronic diastolic heart failure, persistent atrial fibrillation (status post unsuccessful DCCV in 02/12/2017 intolerant to amiodarone in the past, started on Tikosyn 11/2017 and failed therapy, intolerant to Lopressor, Toprol, Coreg, Cardizem and Cardizem CD in the past), hypertension, asthma, HTN, asthma, arthritis, GERD, CKD stage 4.  Had seen [name] 02/13/2019 and he reported intermittent palpitations but no progressive symptoms.  He reviewed options for PPM placement and AV node ablation.  These options were not pursued at that time due to the patient being not overly symptomatic.  She called the office 05/16/2019 reporting frequent UTIs and asthma thought to be secondary to her verapamil.  She wished to stop verapamil and restart amiodarone.  [name] at atrial fibrillation clinic had her stop verapamil and restart amiodarone 200 mg daily on 04/30/2019.  She showed rate controlled atrial fibrillation after restarting.  Last saw [name], [place] on 07/14/2019.  Feeling well overall and great on amiodarone.  She did report some palpitations but heart rate was typically in the 60s to 80s when checked at home   Recent [facility] emergency room visit on 09/10/2019 for shortness of breath.  She had been recently admitted for shortness of breath felt to be related to decreased hemoglobin unexplained via colonoscopy and endoscopy.  She described feeling as bad as when she was admitted.   Hx of at night sleeping sitting on the side of the bed to catch her breath on several occasions throughout the night.  She had evidence of possible pneumonia on her chest x-ray and a slightly elevated BNP along with pedal edema.   Echocardiogram on 09/18/2019 showed decreased LV EF 35 to 40%.  Global hypokinesis, left ventricular diastolic parameters indeterminate.  Mild left atrial dilation, right atrium severely dilated.  Large pericardial effusion.  Moderate mitral valve and tricuspid valve regurgitation She was treated with Zithromax and an increased dose of Lasix.  Patient was concerned that she had been on amiodarone may have caused lung damage as a result.  A referral was placed for pulmonology for consideration of pulmonary function testing.  She had a creatinine of 1.65 and GFR of 29.  Hemoglobin 8.5 and hematocrit 27.3.  Troponin of 12, BNP 848.   Renal function had been steadily decreasing over the prior 8 days. Creatinine was 2.35, GFR was 19.  She had never seen a nephrologist.  She complained of increased weight gain approximately 7 pounds since 09/10/2019 when she weighed 145 pounds.  On prior visit she weighed 152 pounds.  Her Lasix dose was decreased on discharge from recent hospital visit to 20 mg daily.  Husband stated she she was getting significantly short of breath when performing normal activities.  Recently admitted to [name] on 11/04/2019 with primary diagnosis of acute on chronic combined systolic and diastolic heart failure with predominant RV failure, moderate to large pericardial effusion without tamponade, chronic atrial fibrillation, chronic anemia, stage IV CKD.  Started on IV Lasix.  Had a stat echo to  reassess pericardial effusion.  Study showed EF 50% with septal hypokinesis, RV severely enlarged with mildly decreased systolic function and D-shaped septum, mild to moderate MR, severe TR with incomplete coaptation of tricuspid valve.  IVC dilated.  Pericardial effusion was  moderate to large but primarily lateral and inferior.  No evidence of tamponade.  She responded well to IV diuretics.  Right heart cath consistent with advanced RV failure.  [name] was unsure if this was primary RV issue versus burned-out PAH.  VQ scan negative for acute or chronic PEs.  PFTs were ordered and were pending.  Concern for possible cardiac amyloid however patient declined cardiac MRI.  History of anemia and CBC was followed closely and hemoglobin trended up.  Eliquis was discontinued given her chronic anemia and bleeding risk with refusal to accept blood products secondary to being Jehovah's Witness.  Today she states she feels great.  She states she feels like [name] and his colleagues saved her life.  She denies any anginal or exertional symptoms, orthostatic symptoms, PND, orthopnea, CVA or TIA-like symptoms, DVT or PE-like symptoms, or lower extremity edema.  Her weight is staying stable around 125 to 126 pounds.  She has a pending appointment with nephrology for stage IV renal disease.  She recently had 2 iron infusions with [name] and states her hemoglobin is up to 11 at this point.  She states she is weighing herself every day and taking her diuretics as ordered.  She has a follow-up soon with [name] on October 13 as well as a follow-up echocardiogram.  Past Medical History:  Diagnosis Date  . Anemia   . Anxiety   . Arthritis   . Asthma   . Atrial fibrillation ([place])    a. s/p unsuccessful DCCV in 01/2017 and intolerant to Amiodarone --> Rate-control strategy pursued since b. failed Tikosyn in 11/2017  . Breast cancer ([place]) 2001   Right  . CHF (congestive heart failure) ([place])   . Essential hypertension   . GERD (gastroesophageal reflux disease)   . T10 vertebral fracture [name])     Past Surgical History:  Procedure Laterality Date  . BIOPSY N/A 04/22/2018   Procedure: BIOPSY;  Surgeon: Aviva Signs, MD;  [facility];  Service:  Gastroenterology;  Laterality: N/A;  . BIOPSY  09/06/2019   Procedure: BIOPSY;  Surgeon: Daneil Dolin, MD;  [facility];  Service: Endoscopy;;  gastric  . CARDIOVERSION N/A 02/18/2017   Procedure: CARDIOVERSION;  Surgeon: Josue Hector, MD;  Location: [name] ENDOSCOPY;  Service: Cardiovascular;  Laterality: N/A;  . CARDIOVERSION N/A 12/04/2017   Procedure: CARDIOVERSION;  Surgeon: [name], MD;  Location: [place];  Service: Cardiovascular;  Laterality: N/A;  . CATARACT EXTRACTION W/PHACO  07/23/2011   Procedure: CATARACT EXTRACTION PHACO AND INTRAOCULAR LENS PLACEMENT ([place]);  Surgeon: Williams Che, MD;  Location: [facility];  Service: Ophthalmology;  Laterality: Right;  CDE:9.96  . CATARACT EXTRACTION W/PHACO Left 11/13/2018   Procedure: CATARACT EXTRACTION PHACO AND INTRAOCULAR LENS PLACEMENT (I[facility]);  Surgeon: Baruch Goldmann, MD;  Location: [facility];  Service: Ophthalmology;  Laterality: Left;  left, CDE: 7.44  . CHOLECYSTECTOMY N/A 02/14/2015   Procedure: LAPAROSCOPIC CHOLECYSTECTOMY;  Surgeon: Aviva Signs, MD;  Location: [facility];  Service: General;  Laterality: N/A;  . COLONOSCOPY N/A 04/22/2018   Procedure: COLONOSCOPY;  Surgeon: Aviva Signs, MD;  [facility];  Service: Gastroenterology;  Laterality: N/A;  . COLONOSCOPY WITH PROPOFOL N/A 09/06/2019   Procedure:  COLONOSCOPY WITH PROPOFOL;  Surgeon: Daneil Dolin, MD;  [facility];  Service: Endoscopy;  Laterality: N/A;  . ESOPHAGEAL DILATION  09/06/2019   Procedure: ESOPHAGEAL DILATION;  Surgeon: Daneil Dolin, MD;  [facility];  Service: Endoscopy;;  . ESOPHAGOGASTRODUODENOSCOPY N/A 04/22/2018   Procedure: ESOPHAGOGASTRODUODENOSCOPY (EGD);  Surgeon: Aviva Signs, MD;  [facility];  Service: Gastroenterology;  Laterality: N/A;  . ESOPHAGOGASTRODUODENOSCOPY (EGD) WITH PROPOFOL N/A 09/06/2019   Procedure: ESOPHAGOGASTRODUODENOSCOPY (EGD) WITH PROPOFOL;  Surgeon: Daneil Dolin, MD;  [facility];  Service: Endoscopy;  Laterality: N/A;  . GIVENS CAPSULE STUDY N/A 10/31/2019   Procedure: GIVENS CAPSULE STUDY;  Surgeon: Harvel Quale, MD;  [facility];  Service: Gastroenterology;  Laterality: N/A;  . MASTECTOMY     bilateral mastectomy-right breast cancer-left taken by choice  . POLYPECTOMY  04/22/2018   Procedure: POLYPECTOMY;  Surgeon: Aviva Signs, MD;  [facility];  Service: Gastroenterology;;  . POLYPECTOMY  09/06/2019   Procedure: POLYPECTOMY;  Surgeon: Daneil Dolin, MD;  [facility];  Service: Endoscopy;;  cecal, ascending, hepatic flexure, sigmoid  . RIGHT HEART CATH N/A 11/09/2019   Procedure: RIGHT HEART CATH;  Surgeon: Jolaine Artist, MD;  Location: [place] [facility];  Service: Cardiovascular;  Laterality: N/A;  . SEPTOPLASTY  1995    Current Outpatient Medications  Medication Sig Dispense Refill  . acetaminophen (TYLENOL) 650 MG CR tablet Take 650 mg by mouth every 8 (eight) hours as needed for pain.     Marland Kitchen albuterol (VENTOLIN HFA) 108 (90 Base) MCG/ACT inhaler Inhale 1-2 puffs into the lungs every 6 (six) hours as needed for wheezing or shortness of breath.     . ALPRAZolam (XANAX) 0.5 MG tablet Take 0.5 mg by mouth at bedtime.     . bisoprolol (ZEBETA) 5 MG tablet Take 0.5 tablets (2.5 mg total) by mouth daily. 45 tablet 3  . calcium citrate-vitamin D (CITRACAL+D) 315-200 MG-UNIT tablet Take 1 tablet by mouth 2 (two) times daily.    Marland Kitchen epoetin alfa (EPOGEN) 3000 UNIT/ML injection Inject into the skin.    . fluticasone (FLONASE) 50 MCG/ACT nasal spray Place 2 sprays into both nostrils daily. 16 g 0  . loperamide (IMODIUM) 2 MG capsule Take 1 capsule (2 mg total) by mouth every 8 (eight) hours as needed for diarrhea or loose stools. 15 capsule 0  . omeprazole (PRILOSEC) 40 MG capsule Take 40 mg by mouth as needed.    . potassium chloride SA (KLOR-CON) 20 MEQ tablet Take 1 tablet (20  mEq total) by mouth 2 (two) times daily. Start on 09/28/2019 when you restart Lasix 60 tablet 3  . torsemide (DEMADEX) 20 MG tablet Take 3 tablets (60 mg total) by mouth 2 (two) times daily. 540 tablet 3   No current facility-administered medications for this visit.   Allergies:  Feraheme [ferumoxytol], Cardizem cd [diltiazem hcl er beads], Amiodarone, Metoprolol tartrate, Sulfa antibiotics, Tomato, Carvedilol, and Chocolate   Social History: The patient  reports that she quit smoking about 36 years ago. Her smoking use included cigarettes. She has a 28.00 pack-year smoking history. She has never used smokeless tobacco. She reports that she does not drink alcohol and does not use drugs.   Family History: The patient's family history includes Aneurysm in her father; Cancer in her mother, sister, and sister.   ROS:  Please see the history of present illness. Otherwise, complete review of  systems is positive for none.  All other systems are reviewed and negative.   Physical Exam: VS:  BP 110/60   Pulse 82   Ht 5\' 4"  (1.626 m)   Wt 126 lb 12.8 oz (57.5 kg)   SpO2 98%   BMI 21.77 kg/m , BMI Body mass index is 21.77 kg/m.  Wt Readings from Last 3 Encounters:  12/25/19 126 lb 12.8 oz (57.5 kg)  12/24/19 125 lb (56.7 kg)  12/21/19 123 lb 0.3 oz (55.8 kg)    General: Patient appears comfortable at rest. Neck: Supple, no elevated JVP or carotid bruits, no thyromegaly. Lungs: Clear to auscultation, nonlabored breathing at rest. Cardiac: Irregularly irregular rate and rhythm, no S3 or significant systolic murmur, no pericardial rub. Extremities: No edema, distal pulses 2+. Skin: Warm and dry. Musculoskeletal: No kyphosis. Neuropsychiatric: Alert and oriented x3, affect grossly appropriate.  ECG:  EKG on 12/04/2019 showed atrial fibrillation with a rate of 124, LAFB, anterior infarct old.  Recent Labwork: 11/04/2019: B Natriuretic Peptide 1,511.3; TSH 1.887 11/06/2019: Magnesium 2.1 11/26/2019:  ALT 12; AST 26; BUN 32; Creatinine, Ser 2.44; Platelets 183; Potassium 3.7; Sodium 135 12/24/2019: Hemoglobin 11.7     Component Value Date/Time   CHOL 109 09/24/2016 0438   TRIG 40 10/29/2019 1726   HDL 34 (L) 09/24/2016 0438   CHOLHDL 3.2 09/24/2016 0438   VLDL 13 09/24/2016 0438   LDLCALC 62 09/24/2016 0438    Other Studies Reviewed Today:   Right heart cath 11/09/2019 Findings:  RA = 18 RV = 34/14 PA = 35/18 (25) PCW = 16 Fick cardiac output/index = 5.8/3.6 PVR = 1.5 WU FA sat = 99% PA sat = 66%, 67% High SVC = 66% PAPi = 0.9  Assessment: 1. Predominant RV failure 2. Mild PAH 3. Normal left-sided pressures 4. Normal cardiac output  Plan/Discussion:  Hemodynamics suggest advanced RV failure with PAPi < 1.0 however CO is normal. Etiology of RV failure unclear. Primary RV issue versus burned out PAH. If it was the laltter would expect CO to be down. Consider cMRI if she can tolerate. Diurese slowly.   Echocardiogram 11/04/2019 1. Left ventricular ejection fraction, by estimation, is 50%. The left ventricle has mildly decreased function. The left ventricle demonstrates regional wall motion abnormalities with septal hypokinesis. Left ventricular diastolic parameters are indeterminate due to atrial fibrillation. 2. Right ventricular systolic function is mildly reduced. The right ventricular size is severely enlarged. D-shaped interventricular septum suggests RV pressure/volume overload. 3. The mitral valve is normal in structure. Mild to moderate mitral valve regurgitation. No evidence of mitral stenosis. 4. Incomplete coaptation of the tricuspid valve. Tricuspid valve regurgitation is severe. Estimated PA systolic pressure 32 mmHg. 5. The aortic valve is tricuspid. Aortic valve regurgitation is not visualized. Mild to moderate aortic valve sclerosis/calcification is present, without any evidence of aortic stenosis. 6. Right atrial size was severely dilated. 7.  The inferior vena cava is dilated in size with <50% respiratory variability, suggesting right atrial pressure of 15 mmHg. 8. Moderate to large primarily posterior and inferior pericardial effusion. I do not think tamponade is present. The IVC is dilated but there is no RV diastolic collapse and there is <25% respirophasic variation of mitral E inflow doppler signal.   Echocardiogram 09/18/2019  1. Left ventricular ejection fraction, by estimation, is 35 to 40%. The left ventricle has moderately decreased function. The left ventricle demonstrates global hypokinesis. Left ventricular diastolic parameters are indeterminate. 2. Right ventricular systolic function is mildly reduced. The  right ventricular size is mildly enlarged. There is normal pulmonary artery systolic pressure. The estimated right ventricular systolic pressure is 76.1 mmHg. 3. Left atrial size was moderately dilated. 4. Right atrial size was severely dilated. 5. Large pericardial effusion. The pericardial effusion is posterior to the left ventricle. No obvious RV compromise to suggest tamponade and there are signs of chronicity. Small anterior effusion. 6. The mitral valve is grossly normal. Moderate mitral valve regurgitation. 7. Tricuspid valve regurgitation is moderate. 8. The aortic valve is tricuspid. Aortic valve regurgitation is not visualized. Mild to moderate aortic valve sclerosis/calcification is present, without any evidence of aortic stenosis. Moderate nodular calcification of the noncoronary cusp. 9. The inferior vena cava is dilated in size with <50% respiratory variability, suggesting right atrial pressure of 15 mmHg.   Echocardiogram: 09/2016 Study Conclusions   - Left ventricle: The cavity size was normal. Wall thickness was  normal. Systolic function was normal. The estimated ejection  fraction was in the range of 55% to 60%. Wall motion was normal;  there were no regional wall motion  abnormalities.  - Aortic valve: Mildly to moderately calcified annulus. Mildly  thickened leaflets. Valve area (VTI): 2.92 cm^2. Valve area  (Vmax): 2.73 cm^2. Valve area (Vmean): 2.61 cm^2.  - Mitral valve: There was moderate regurgitation.  - Left atrium: The atrium was severely dilated.  - Right atrium: The atrium was severely dilated.  - Atrial septum: No defect or patent foramen ovale was identified.  - Pulmonary arteries: Systolic pressure was moderately increased.  PA peak pressure: 49 mm Hg (S).  - Pericardium, extracardiac: There is a small circumferential  pericardial effusion.  - Technically adequate study.   Event Monitor: 11/2017 1. Atrial fib with a controlled VR and a RVR 2. NSVT 3. No prolonged pauses or bradycardia  Gregg Taylor,M.D.  Assessment and Plan:   1. DOE (dyspnea on exertion)  Recent hospitalization [facility] for heart failure.  Left heart cath and repeat echocardiogram were performed.  See results above.  Currently denies any dyspnea on exertion.  She had PFTs ordered on 11/10/2019.    2.  HFrEF. Recent admission for acute on chronic combined systolic and diastolic heart failure with predominant right heart failure.  Had right heart cath and repeat echocardiogram.  See results above.  She was discharged on p.o. torsemide 60 mg p.o. twice daily.   Continue bisoprolol 2.5 mg daily make sure to make your appointment with Dr. Theador Hawthorne secondary to your renal failure issues.  3. Persistent atrial fibrillation (HCC) Currently on bisoprolol 2.5 mg daily.  Rate is controlled today with heart rate of 82   Continue bisoprolol 2.5 mg daily.  Eliquis was DC'd due to history of anemia, chronic renal failure, and refusal of blood products secondary to being a Jehovah's Witness  4. History of anemia due to chronic kidney disease Recent colonoscopy and EGD for anemia.  Recent hemoglobin and hematocrit were 8.5 and 27.3 respectively.  Likely anemia from chronic  renal disease. Has been seeing hematology [name] and has received 2 recent iron infusions.  Recent H&H on 12/24/2019: 11.7 and 37.9.  5.  Stage IV renal failure. Recent creatinine 2.44, GFR 18.  Patient has a pending appointment with Dr. Theador Hawthorne.  Medication Adjustments/Labs and Tests Ordered: Current medicines are reviewed at length with the patient today.  Concerns regarding medicines are outlined above.   Disposition: Follow-up with Dr. Harl Bowie or APP 3 months   Signed, Levell July, NP 12/25/2019 12:14 PM  [facility] [place], [place], [place] [address] Phone: [phone]; Fax: [phone]

## 2019-12-25 ENCOUNTER — Encounter: Payer: Self-pay | Admitting: Family Medicine

## 2019-12-25 ENCOUNTER — Ambulatory Visit (INDEPENDENT_AMBULATORY_CARE_PROVIDER_SITE_OTHER): Payer: Medicare Other | Admitting: Family Medicine

## 2019-12-25 VITALS — BP 110/60 | HR 82 | Ht 64.0 in | Wt 126.8 lb

## 2019-12-25 DIAGNOSIS — I4819 Other persistent atrial fibrillation: Secondary | ICD-10-CM | POA: Diagnosis not present

## 2019-12-25 DIAGNOSIS — N184 Chronic kidney disease, stage 4 (severe): Secondary | ICD-10-CM

## 2019-12-25 DIAGNOSIS — R0600 Dyspnea, unspecified: Secondary | ICD-10-CM

## 2019-12-25 DIAGNOSIS — R0609 Other forms of dyspnea: Secondary | ICD-10-CM

## 2019-12-25 DIAGNOSIS — D638 Anemia in other chronic diseases classified elsewhere: Secondary | ICD-10-CM | POA: Diagnosis not present

## 2019-12-25 DIAGNOSIS — I5043 Acute on chronic combined systolic (congestive) and diastolic (congestive) heart failure: Secondary | ICD-10-CM | POA: Diagnosis not present

## 2019-12-25 DIAGNOSIS — I130 Hypertensive heart and chronic kidney disease with heart failure and stage 1 through stage 4 chronic kidney disease, or unspecified chronic kidney disease: Secondary | ICD-10-CM | POA: Diagnosis not present

## 2019-12-25 NOTE — Patient Instructions (Addendum)
Medication Instructions:  Your physician recommends that you continue on your current medications as directed. Please refer to the Current Medication list given to you today.   Labwork: None  Testing/Procedures: None  Follow-Up: Your physician recommends that you schedule a follow-up appointment in: 3 months  Any Other Special Instructions Will Be Listed Below (If Applicable).     If you need a refill on your cardiac medications before your next appointment, please call your pharmacy.   

## 2020-01-04 ENCOUNTER — Other Ambulatory Visit: Payer: Self-pay

## 2020-01-04 ENCOUNTER — Encounter (HOSPITAL_COMMUNITY)
Admission: RE | Admit: 2020-01-04 | Discharge: 2020-01-04 | Disposition: A | Discharge status: Home / Self Care | Payer: Medicare Other | Source: Ambulatory Visit | Attending: Nephrology | Admitting: Nephrology

## 2020-01-04 ENCOUNTER — Encounter (HOSPITAL_COMMUNITY): Payer: Self-pay

## 2020-01-04 DIAGNOSIS — D631 Anemia in chronic kidney disease: Secondary | ICD-10-CM | POA: Diagnosis not present

## 2020-01-04 DIAGNOSIS — N184 Chronic kidney disease, stage 4 (severe): Secondary | ICD-10-CM | POA: Diagnosis not present

## 2020-01-04 HISTORY — DX: Chronic kidney disease, unspecified: N18.9

## 2020-01-04 LAB — POCT HEMOGLOBIN-HEMACUE: Hemoglobin: 11.9 g/dL — ABNORMAL LOW (ref 12.0–15.0)

## 2020-01-04 MED ORDER — EPOETIN ALFA 3000 UNIT/ML IJ SOLN: 3000.0000 [IU] | Freq: Once | INTRAMUSCULAR | Status: DC

## 2020-01-06 ENCOUNTER — Encounter (HOSPITAL_COMMUNITY): Payer: Medicare Other | Admitting: Cardiology

## 2020-01-06 ENCOUNTER — Encounter (HOSPITAL_COMMUNITY): Payer: Self-pay | Admitting: Cardiology

## 2020-01-06 ENCOUNTER — Other Ambulatory Visit: Payer: Self-pay

## 2020-01-06 ENCOUNTER — Ambulatory Visit (HOSPITAL_BASED_OUTPATIENT_CLINIC_OR_DEPARTMENT_OTHER)
Admission: RE | Admit: 2020-01-06 | Discharge: 2020-01-06 | Disposition: A | Discharge status: Home / Self Care | Payer: Medicare Other | Source: Ambulatory Visit | Attending: Cardiology | Admitting: Cardiology

## 2020-01-06 ENCOUNTER — Ambulatory Visit (HOSPITAL_COMMUNITY)
Admission: RE | Admit: 2020-01-06 | Discharge: 2020-01-06 | Disposition: A | Discharge status: Home / Self Care | Payer: Medicare Other | Source: Ambulatory Visit | Attending: Cardiology | Admitting: Cardiology

## 2020-01-06 ENCOUNTER — Ambulatory Visit: Payer: Medicare Other | Admitting: Gastroenterology

## 2020-01-06 VITALS — BP 138/70 | HR 73 | Ht 64.0 in | Wt 124.8 lb

## 2020-01-06 DIAGNOSIS — D631 Anemia in chronic kidney disease: Secondary | ICD-10-CM | POA: Insufficient documentation

## 2020-01-06 DIAGNOSIS — I5032 Chronic diastolic (congestive) heart failure: Secondary | ICD-10-CM

## 2020-01-06 DIAGNOSIS — K219 Gastro-esophageal reflux disease without esophagitis: Secondary | ICD-10-CM | POA: Insufficient documentation

## 2020-01-06 DIAGNOSIS — Z87891 Personal history of nicotine dependence: Secondary | ICD-10-CM | POA: Insufficient documentation

## 2020-01-06 DIAGNOSIS — J90 Pleural effusion, not elsewhere classified: Secondary | ICD-10-CM | POA: Diagnosis not present

## 2020-01-06 DIAGNOSIS — I482 Chronic atrial fibrillation, unspecified: Secondary | ICD-10-CM | POA: Insufficient documentation

## 2020-01-06 DIAGNOSIS — Z853 Personal history of malignant neoplasm of breast: Secondary | ICD-10-CM | POA: Diagnosis not present

## 2020-01-06 DIAGNOSIS — J45909 Unspecified asthma, uncomplicated: Secondary | ICD-10-CM | POA: Insufficient documentation

## 2020-01-06 DIAGNOSIS — Z79899 Other long term (current) drug therapy: Secondary | ICD-10-CM | POA: Insufficient documentation

## 2020-01-06 DIAGNOSIS — N184 Chronic kidney disease, stage 4 (severe): Secondary | ICD-10-CM | POA: Insufficient documentation

## 2020-01-06 DIAGNOSIS — D649 Anemia, unspecified: Secondary | ICD-10-CM | POA: Diagnosis not present

## 2020-01-06 DIAGNOSIS — D638 Anemia in other chronic diseases classified elsewhere: Secondary | ICD-10-CM

## 2020-01-06 DIAGNOSIS — I5042 Chronic combined systolic (congestive) and diastolic (congestive) heart failure: Secondary | ICD-10-CM | POA: Diagnosis not present

## 2020-01-06 DIAGNOSIS — I130 Hypertensive heart and chronic kidney disease with heart failure and stage 1 through stage 4 chronic kidney disease, or unspecified chronic kidney disease: Secondary | ICD-10-CM | POA: Diagnosis not present

## 2020-01-06 DIAGNOSIS — I313 Pericardial effusion (noninflammatory): Secondary | ICD-10-CM | POA: Diagnosis not present

## 2020-01-06 DIAGNOSIS — I4891 Unspecified atrial fibrillation: Secondary | ICD-10-CM | POA: Diagnosis not present

## 2020-01-06 DIAGNOSIS — I4819 Other persistent atrial fibrillation: Secondary | ICD-10-CM | POA: Diagnosis not present

## 2020-01-06 DIAGNOSIS — Z9013 Acquired absence of bilateral breasts and nipples: Secondary | ICD-10-CM | POA: Diagnosis not present

## 2020-01-06 DIAGNOSIS — K922 Gastrointestinal hemorrhage, unspecified: Secondary | ICD-10-CM | POA: Diagnosis not present

## 2020-01-06 DIAGNOSIS — I272 Pulmonary hypertension, unspecified: Secondary | ICD-10-CM | POA: Diagnosis not present

## 2020-01-06 LAB — CBC
HCT: 37.9 % (ref 36.0–46.0)
Hemoglobin: 12 g/dL (ref 12.0–15.0)
MCH: 29.8 pg (ref 26.0–34.0)
MCHC: 31.7 g/dL (ref 30.0–36.0)
MCV: 94 fL (ref 80.0–100.0)
Platelets: 188 10*3/uL (ref 150–400)
RBC: 4.03 MIL/uL (ref 3.87–5.11)
RDW: 17.4 % — ABNORMAL HIGH (ref 11.5–15.5)
WBC: 5.6 10*3/uL (ref 4.0–10.5)
nRBC: 0 % (ref 0.0–0.2)

## 2020-01-06 LAB — ECHOCARDIOGRAM COMPLETE
AR max vel: 2.19 cm2
AV Area VTI: 1.79 cm2
AV Area mean vel: 2.25 cm2
AV Mean grad: 2 mmHg
AV Peak grad: 4.7 mmHg
Ao pk vel: 1.08 m/s
Area-P 1/2: 4.4 cm2
MV M vel: 3.58 m/s
MV Peak grad: 51.1 mmHg
Radius: 0.35 cm
S' Lateral: 3.8 cm

## 2020-01-06 LAB — BASIC METABOLIC PANEL
Anion gap: 12 (ref 5–15)
BUN: 23 mg/dL (ref 8–23)
CO2: 28 mmol/L (ref 22–32)
Calcium: 9.8 mg/dL (ref 8.9–10.3)
Chloride: 98 mmol/L (ref 98–111)
Creatinine, Ser: 2.21 mg/dL — ABNORMAL HIGH (ref 0.44–1.00)
GFR, Estimated: 20 mL/min — ABNORMAL LOW (ref >60)
Glucose, Bld: 108 mg/dL — ABNORMAL HIGH (ref 70–99)
Potassium: 3.8 mmol/L (ref 3.5–5.1)
Sodium: 138 mmol/L (ref 135–145)

## 2020-01-06 NOTE — Patient Instructions (Signed)
Labs done today, we will call you for abnormal results  You have been ordered a PYP Scan.  This is done in the Radiology Department of [facility].  When you come for this test please plan to be there 2-3 hours.  You have been referred to Dr Laural Golden for an endoscopy  Your physician recommends that you schedule a follow-up appointment in: 6-8 weeks  If you have any questions or concerns before your next appointment please send [place] a message through [place] or call our office at [phone].    TO LEAVE A MESSAGE FOR THE NURSE SELECT OPTION 2, PLEASE LEAVE A MESSAGE INCLUDING: . YOUR NAME . DATE OF BIRTH . CALL BACK NUMBER . REASON FOR CALL**this is important as we prioritize the call backs  [place] AS LONG AS YOU CALL BEFORE 4:00 PM  At the [facility], you and your health needs are our priority. As part of our continuing mission to provide you with exceptional heart care, we have created designated Provider Care Teams. These Care Teams include your primary Cardiologist (physician) and Advanced Practice Providers (APPs- Physician Assistants and Nurse Practitioners) who all work together to provide you with the care you need, when you need it.   You may see any of the following providers on your designated Care Team at your next follow up: Marland Kitchen Dr Glori Bickers . Dr [name] . Darrick Grinder, NP . Lyda Jester, PA . Audry Riles, PharmD   Please be sure to bring in all your medications bottles to every appointment.

## 2020-01-06 NOTE — Progress Notes (Signed)
  Echocardiogram 2D Echocardiogram 3D has been performed.  Darlina Sicilian M 01/06/2020, 1:36 PM

## 2020-01-06 NOTE — Progress Notes (Signed)
PCP: [name], MD HF Cardiology: Dr. Aundra Dubin  79 y.o. with history of CKD 4, chronic atrial fibrillation, anemia, and chronic systolic CHF was referred initially by [name] for evaluation of CHF.  Patient has a complex past history.  She has a long history of atrial fibrillation, now chronic as she has failed DCCV and Tikosyn and did not tolerate amiodarone.  Her last echo in 6/21 showed EF 35-40% with mild RV dysfunction, large pericardial effusion without tamponade, moderate MR, moderate TR.  Cause of cardiomyopathy is not certain.    She has been admitted multiple times this year to [facility]. She feels like hospitalizations have not helped her shortness of breath.  She was admitted to [facility] 10/29/19.  She was found to be anemic with hgb 6.4, FOBT+.  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  Hgb was 7.5 at time of discharge.  Eliquis was helped for several days, she was told to restart it on 8/11.    She was admitted again in 8/21 to [facility] with CHF exacerbation.  RHC in 8/21 showed elevated R>L filling pressures with mild pulmonary hypertension and low PVR. PAPi was low suggesting significant RV dysfunction.  Echo done today and reviewed showed EF 50-55% with septal hypokinesis, mild RV enlargement with mildly decreased RV systolic function, PASP 44 mmHg, moderate MR with 2 eccentric jets, moderate-severe TR, moderate lateral pericardial effusion (improved from prior echo).   She returns today for followup of CHF.  Weight is stable compared to last appointment.  She feels better overall.  Significantly less dyspnea, she gets short of breath now only when walking a long distance such as walking through a large store.  No peripheral edema.  No chest pain.  No orthopnea/PND.  No lightheadedness/falls.   ECG (personally reviewed): atrial fibrillation, poor RWP, LAFB  Labs (8/21): K 3.9, creatinine 2.35, hgb 7.5, FOBT+ Labs (9/21): K 3.7, creatinine 2.44 Labs (10/21): hgb  11.9  PMH: 1. CKD stage 4 2. Chronic atrial fibrillation: Failed DCCV in 11/18.  - Failed Tikosyn in 9/19.  - Intolerant of calcium channel blockers.  - Intolerant of amiodarone.  3. HTN 4. Asthma 5. GERD 6. Chronic systolic => diastolic CHF: Uncertain etiology.  - Echo (6/21): EF 35-40%, global hypokinesis, mildly decreased RV systolic function with mild RV dilation, biatrial enlargement, large pericardial effusion without tamponade, moderate MR, moderate TR, IVC dilated.  - RHC (8/21): mean RA 18, PA 35/18, mean PCWP 16, CI 3.6, PVR 1.5 WU, PAPi 0.9 => primarily RV failure - V/Q scan (8/21): Not suggestive of chronic PEs - Echo (10/21): EF 50-55% with septal hypokinesis, mild RV enlargement with mildly decreased RV systolic function, PASP 44 mmHg, moderate MR with 2 eccentric jets, moderate-severe TR, moderate lateral pericardial effusion (improved from prior echo)  7. Anemia of chronic renal disease.  8. Breast cancer: Bilateral mastectomy.  9. GI bleeding: Admission in 8/21. EGD/c-scope in 6/21 negative.   Social History   Socioeconomic History  . Marital status: Married    Spouse name: [name]  . Number of children: 1  . Years of education: Not on file  . Highest education level: Not on file  Occupational History  . Occupation: retired  Tobacco Use  . Smoking status: Former Smoker    Packs/day: 1.00    Years: 28.00    Pack years: 28.00    Types: Cigarettes    Quit date: 07/17/1983    Years since quitting: 36.4  .  Smokeless tobacco: Never Used  Vaping Use  . Vaping Use: Never used  Substance and Sexual Activity  . Alcohol use: No  . Drug use: No  . Sexual activity: Not on file  Other Topics Concern  . Not on file  Social History Narrative  . Not on file   Social Determinants of Health   Financial Resource Strain:   . Difficulty of Paying Living Expenses: Not on file  Food Insecurity: No Food Insecurity  . Worried About Charity fundraiser in the Last Year:  Never true  . Ran Out of Food in the Last Year: Never true  Transportation Needs: No Transportation Needs  . Lack of Transportation (Medical): No  . Lack of Transportation (Non-Medical): No  Physical Activity:   . Days of Exercise per Week: Not on file  . Minutes of Exercise per Session: Not on file  Stress:   . Feeling of Stress : Not on file  Social Connections: Moderately Integrated  . Frequency of Communication with Friends and Family: Once a week  . Frequency of Social Gatherings with Friends and Family: Once a week  . Attends Religious Services: 1 to 4 times per year  . Active Member of Clubs or Organizations: No  . Attends Archivist Meetings: 1 to 4 times per year  . Marital Status: Married  Human resources officer Violence:   . Fear of Current or Ex-Partner: Not on file  . Emotionally Abused: Not on file  . Physically Abused: Not on file  . Sexually Abused: Not on file   Family History  Problem Relation Age of Onset  . Cancer Mother   . Aneurysm Father   . Cancer Sister   . Cancer Sister   . Anesthesia problems Neg Hx   . Hypotension Neg Hx   . Malignant hyperthermia Neg Hx   . Pseudochol deficiency Neg Hx   . Colon cancer Neg Hx    ROS: All systems reviewed and negative except as per HPI.  Current Outpatient Medications  Medication Sig Dispense Refill  . acetaminophen (TYLENOL) 650 MG CR tablet Take 650 mg by mouth every 8 (eight) hours as needed for pain.     Marland Kitchen albuterol (VENTOLIN HFA) 108 (90 Base) MCG/ACT inhaler Inhale 1-2 puffs into the lungs every 6 (six) hours as needed for wheezing or shortness of breath.     . ALPRAZolam (XANAX) 0.5 MG tablet Take 0.5 mg by mouth at bedtime.     . bisoprolol (ZEBETA) 5 MG tablet Take 0.5 tablets (2.5 mg total) by mouth daily. 45 tablet 3  . calcium citrate-vitamin D (CITRACAL+D) 315-200 MG-UNIT tablet Take 1 tablet by mouth 2 (two) times daily.    . fluticasone (FLONASE) 50 MCG/ACT nasal spray Place 2 sprays into both  nostrils daily. 16 g 0  . loperamide (IMODIUM) 2 MG capsule Take 1 capsule (2 mg total) by mouth every 8 (eight) hours as needed for diarrhea or loose stools. 15 capsule 0  . omeprazole (PRILOSEC) 40 MG capsule Take 40 mg by mouth as needed.    . potassium chloride SA (KLOR-CON) 20 MEQ tablet Take 1 tablet (20 mEq total) by mouth 2 (two) times daily. Start on 09/28/2019 when you restart Lasix 60 tablet 3  . torsemide (DEMADEX) 20 MG tablet Take 3 tablets (60 mg total) by mouth 2 (two) times daily. 540 tablet 3   No current facility-administered medications for this encounter.   BP 138/70 (BP Location: Left Arm, Patient Position:  Sitting, Cuff Size: Normal)   Pulse 73   Ht 5\' 4"  (1.626 m)   Wt 56.6 kg (124 lb 12.8 oz)   SpO2 97%   BMI 21.42 kg/m  General: NAD Neck:JVP 7-8 cn, no thyromegaly or thyroid nodule.  Lungs: mild crackles at bases.  CV: Nondisplaced PMI.  Heart irregular S1/S2, no S3/S4, 2/6 HSM LLSB/apex.  Trace ankle edema.  No carotid bruit.  Normal pedal pulses.  Abdomen: Soft, nontender, no hepatosplenomegaly, no distention.  Skin: Intact without lesions or rashes.  Neurologic: Alert and oriented x 3.  Psych: Normal affect. Extremities: No clubbing or cyanosis.  HEENT: Normal.   Assessment/Plan: 1. Chronic systolic => diastolic CHF:  Prominent RV failure.  Echo in 6/21 with EF 35-40% with mild RV dysfunction, large pericardial effusion without tamponade, moderate MR, moderate TR.  RHC in 8/21 with preserved cardiac output, primarily RV failure, mild pulmonary hypertension with low PVR. V/Q scan in 8/21 not suggestive of chronic PEs.  Echo in 10/21 with EF 50-55% with septal hypokinesis, mild RV enlargement with mildly decreased RV systolic function, PASP 44 mmHg, moderate MR with 2 eccentric jets, moderate-severe TR, moderate lateral pericardial effusion (improved from prior echo).  Cause of cardiomyopathy is not certain. No chest pain.  Volume status is much better currently.   NYHA class II symptoms.  - Continue torsemide 60 mg bid, BMET today.   - Continue KCl 20 bid.   - Continue bisoprolol 2.5 mg daily.  - No digoxin, Entresto, spironolactone with improved LV EF and CKD stage IV.  - Would consider cardiac amyloidosis => she does not want to do cardiac MRI due to claustrophobia, so will arrange for PYP scan and check myeloma panel and urine immunofixation.  2. Atrial fibrillation: Chronic, has failed DCCV and Tikosyn.  She has not tolerated amiodarone.  Rate control good on bisoprolol 2.5 mg daily.  - Continue bisoprolol 2.5 daily.  - She has been off apixaban with anemia, GI bleeding.  Hgb recently up to 11.9 => would like her to get followup with Dr. Laural Golden and possible capsule endoscopy, then would consider restarting apixaban 2.5 mg bid in the future.  3. Anemia: Multifactorial, suspect due to renal disease as well as GI bleeding.  Hgb down to 6.5 in 8/21 in setting of GI bleeding with FOBT+.  Hgb up to 11.9 in 10/21.    - Would like her to see GI for possible capsule endoscopy, would consider restarting apixaban 2.5 mg bid if no worrisome lesions.   - No blood products (Jehovah's Witness).  - Following with hematology for Aranesp/feraheme.  4. CKD: Stage IV.  Watch creatinine closely with diuresis. She has a nephrologist.  - BMET today.  5. Pericardial effusion: Large on 6/21 echo without tamponade, echo today showed pericardial effusion moderate laterally (smaller).   Bailey Bishop 01/06/2020

## 2020-01-08 LAB — MULTIPLE MYELOMA PANEL, SERUM
Albumin SerPl Elph-Mcnc: 3.8 g/dL (ref 2.9–4.4)
Albumin/Glob SerPl: 1.1 (ref 0.7–1.7)
Alpha 1: 0.3 g/dL (ref 0.0–0.4)
Alpha2 Glob SerPl Elph-Mcnc: 0.8 g/dL (ref 0.4–1.0)
B-Globulin SerPl Elph-Mcnc: 1.1 g/dL (ref 0.7–1.3)
Gamma Glob SerPl Elph-Mcnc: 1.5 g/dL (ref 0.4–1.8)
Globulin, Total: 3.7 g/dL (ref 2.2–3.9)
IgA: 493 mg/dL — ABNORMAL HIGH (ref 64–422)
IgG (Immunoglobin G), Serum: 1169 mg/dL (ref 586–1602)
IgM (Immunoglobulin M), Srm: 390 mg/dL — ABNORMAL HIGH (ref 26–217)
Total Protein ELP: 7.5 g/dL (ref 6.0–8.5)

## 2020-01-11 LAB — IMMUNOFIXATION, URINE

## 2020-01-14 ENCOUNTER — Telehealth (HOSPITAL_COMMUNITY): Payer: Self-pay | Admitting: Cardiology

## 2020-01-14 NOTE — Telephone Encounter (Signed)
Patient stated she need a referral from [facility] so Dr Aviva Signs can perform her Endoscopy procedure. Please advise

## 2020-01-15 NOTE — Telephone Encounter (Signed)
Lmtrc to clarify what exactly she needs.

## 2020-01-16 DIAGNOSIS — J019 Acute sinusitis, unspecified: Secondary | ICD-10-CM | POA: Diagnosis not present

## 2020-01-18 ENCOUNTER — Encounter (HOSPITAL_COMMUNITY): Payer: Self-pay

## 2020-01-18 ENCOUNTER — Encounter (HOSPITAL_COMMUNITY)
Admission: RE | Admit: 2020-01-18 | Discharge: 2020-01-18 | Disposition: A | Discharge status: Home / Self Care | Payer: Medicare Other | Source: Ambulatory Visit | Attending: Nephrology | Admitting: Nephrology

## 2020-01-18 ENCOUNTER — Other Ambulatory Visit: Payer: Self-pay

## 2020-01-18 DIAGNOSIS — D631 Anemia in chronic kidney disease: Secondary | ICD-10-CM | POA: Diagnosis not present

## 2020-01-18 DIAGNOSIS — N184 Chronic kidney disease, stage 4 (severe): Secondary | ICD-10-CM | POA: Diagnosis not present

## 2020-01-18 LAB — RENAL FUNCTION PANEL
Albumin: 4.1 g/dL (ref 3.5–5.0)
Anion gap: 10 (ref 5–15)
BUN: 27 mg/dL — ABNORMAL HIGH (ref 8–23)
CO2: 30 mmol/L (ref 22–32)
Calcium: 9.7 mg/dL (ref 8.9–10.3)
Chloride: 98 mmol/L (ref 98–111)
Creatinine, Ser: 1.97 mg/dL — ABNORMAL HIGH (ref 0.44–1.00)
GFR, Estimated: 25 mL/min — ABNORMAL LOW (ref >60)
Glucose, Bld: 98 mg/dL (ref 70–99)
Phosphorus: 4.4 mg/dL (ref 2.5–4.6)
Potassium: 3.5 mmol/L (ref 3.5–5.1)
Sodium: 138 mmol/L (ref 135–145)

## 2020-01-18 LAB — CBC
HCT: 39.1 % (ref 36.0–46.0)
Hemoglobin: 12.6 g/dL (ref 12.0–15.0)
MCH: 31.1 pg (ref 26.0–34.0)
MCHC: 32.2 g/dL (ref 30.0–36.0)
MCV: 96.5 fL (ref 80.0–100.0)
Platelets: 172 10*3/uL (ref 150–400)
RBC: 4.05 MIL/uL (ref 3.87–5.11)
RDW: 15.8 % — ABNORMAL HIGH (ref 11.5–15.5)
WBC: 7.8 10*3/uL (ref 4.0–10.5)
nRBC: 0 % (ref 0.0–0.2)

## 2020-01-18 LAB — PROTEIN / CREATININE RATIO, URINE
Creatinine, Urine: 57.86 mg/dL
Protein Creatinine Ratio: 0.16 mg/mg{Cre} — ABNORMAL HIGH (ref 0.00–0.15)
Total Protein, Urine: 9 mg/dL

## 2020-01-18 LAB — IRON AND TIBC
Iron: 92 ug/dL (ref 28–170)
Saturation Ratios: 26 % (ref 10.4–31.8)
TIBC: 355 ug/dL (ref 250–450)
UIBC: 263 ug/dL

## 2020-01-18 LAB — POCT HEMOGLOBIN-HEMACUE: Hemoglobin: 11.6 g/dL — ABNORMAL LOW (ref 12.0–15.0)

## 2020-01-18 LAB — VITAMIN D 25 HYDROXY (VIT D DEFICIENCY, FRACTURES): Vit D, 25-Hydroxy: 34.93 ng/mL (ref 30–100)

## 2020-01-18 MED ORDER — EPOETIN ALFA 3000 UNIT/ML IJ SOLN: 3000.0000 [IU] | Freq: Once | INTRAMUSCULAR | Status: DC

## 2020-01-19 LAB — PARATHYROID HORMONE, INTACT (NO CA): PTH: 100 pg/mL — ABNORMAL HIGH (ref 15–65)

## 2020-01-20 DIAGNOSIS — J441 Chronic obstructive pulmonary disease with (acute) exacerbation: Secondary | ICD-10-CM | POA: Diagnosis not present

## 2020-01-28 DIAGNOSIS — E211 Secondary hyperparathyroidism, not elsewhere classified: Secondary | ICD-10-CM | POA: Diagnosis not present

## 2020-01-28 DIAGNOSIS — I129 Hypertensive chronic kidney disease with stage 1 through stage 4 chronic kidney disease, or unspecified chronic kidney disease: Secondary | ICD-10-CM | POA: Diagnosis not present

## 2020-01-28 DIAGNOSIS — D638 Anemia in other chronic diseases classified elsewhere: Secondary | ICD-10-CM | POA: Diagnosis not present

## 2020-01-28 DIAGNOSIS — N184 Chronic kidney disease, stage 4 (severe): Secondary | ICD-10-CM | POA: Diagnosis not present

## 2020-01-28 DIAGNOSIS — I5022 Chronic systolic (congestive) heart failure: Secondary | ICD-10-CM | POA: Diagnosis not present

## 2020-02-01 ENCOUNTER — Encounter (HOSPITAL_COMMUNITY)
Admission: RE | Admit: 2020-02-01 | Discharge: 2020-02-01 | Disposition: A | Discharge status: Home / Self Care | Payer: Medicare Other | Source: Ambulatory Visit | Attending: Nephrology | Admitting: Nephrology

## 2020-02-01 ENCOUNTER — Encounter (HOSPITAL_COMMUNITY): Payer: Self-pay

## 2020-02-01 ENCOUNTER — Other Ambulatory Visit: Payer: Self-pay

## 2020-02-01 DIAGNOSIS — D631 Anemia in chronic kidney disease: Secondary | ICD-10-CM | POA: Insufficient documentation

## 2020-02-01 DIAGNOSIS — N184 Chronic kidney disease, stage 4 (severe): Secondary | ICD-10-CM | POA: Diagnosis not present

## 2020-02-01 LAB — POCT HEMOGLOBIN-HEMACUE: Hemoglobin: 12.6 g/dL (ref 12.0–15.0)

## 2020-02-01 MED ORDER — EPOETIN ALFA 3000 UNIT/ML IJ SOLN: 3000.0000 [IU] | Freq: Once | INTRAMUSCULAR | Status: DC

## 2020-02-15 ENCOUNTER — Other Ambulatory Visit: Payer: Self-pay

## 2020-02-15 ENCOUNTER — Encounter (HOSPITAL_COMMUNITY): Payer: Self-pay

## 2020-02-15 ENCOUNTER — Encounter (HOSPITAL_COMMUNITY)
Admission: RE | Admit: 2020-02-15 | Discharge: 2020-02-15 | Disposition: A | Discharge status: Home / Self Care | Payer: Medicare Other | Source: Ambulatory Visit | Attending: Nephrology | Admitting: Nephrology

## 2020-02-15 DIAGNOSIS — N184 Chronic kidney disease, stage 4 (severe): Secondary | ICD-10-CM | POA: Diagnosis not present

## 2020-02-15 DIAGNOSIS — D631 Anemia in chronic kidney disease: Secondary | ICD-10-CM | POA: Diagnosis not present

## 2020-02-15 LAB — POCT HEMOGLOBIN-HEMACUE: Hemoglobin: 12.3 g/dL (ref 12.0–15.0)

## 2020-02-15 MED ORDER — EPOETIN ALFA 3000 UNIT/ML IJ SOLN: 3000.0000 [IU] | Freq: Once | INTRAMUSCULAR | Status: DC

## 2020-02-16 ENCOUNTER — Other Ambulatory Visit (HOSPITAL_COMMUNITY): Payer: Medicare Other

## 2020-02-16 ENCOUNTER — Encounter (HOSPITAL_COMMUNITY)
Admission: RE | Admit: 2020-02-16 | Discharge: 2020-02-16 | Disposition: A | Discharge status: Home / Self Care | Payer: Medicare Other | Source: Ambulatory Visit | Attending: Cardiology | Admitting: Cardiology

## 2020-02-16 DIAGNOSIS — I5042 Chronic combined systolic (congestive) and diastolic (congestive) heart failure: Secondary | ICD-10-CM | POA: Insufficient documentation

## 2020-02-16 DIAGNOSIS — I4891 Unspecified atrial fibrillation: Secondary | ICD-10-CM | POA: Diagnosis not present

## 2020-02-16 MED ORDER — TECHNETIUM TC 99M PYROPHOSPHATE
22.0000 | Freq: Once | INTRAVENOUS | Status: AC | PRN
  Administered 2020-02-16: 22 via INTRAVENOUS
  Filled 2020-02-16: qty 22

## 2020-02-26 ENCOUNTER — Ambulatory Visit (HOSPITAL_COMMUNITY)
Admission: RE | Admit: 2020-02-26 | Discharge: 2020-02-26 | Disposition: A | Discharge status: Home / Self Care | Payer: Medicare Other | Source: Ambulatory Visit | Attending: Cardiology | Admitting: Cardiology

## 2020-02-26 ENCOUNTER — Other Ambulatory Visit: Payer: Self-pay

## 2020-02-26 ENCOUNTER — Encounter (HOSPITAL_COMMUNITY): Payer: Self-pay | Admitting: Cardiology

## 2020-02-26 VITALS — BP 144/78 | HR 92 | Ht 64.0 in | Wt 124.4 lb

## 2020-02-26 DIAGNOSIS — I130 Hypertensive heart and chronic kidney disease with heart failure and stage 1 through stage 4 chronic kidney disease, or unspecified chronic kidney disease: Secondary | ICD-10-CM | POA: Insufficient documentation

## 2020-02-26 DIAGNOSIS — J4520 Mild intermittent asthma, uncomplicated: Secondary | ICD-10-CM | POA: Diagnosis not present

## 2020-02-26 DIAGNOSIS — D631 Anemia in chronic kidney disease: Secondary | ICD-10-CM | POA: Insufficient documentation

## 2020-02-26 DIAGNOSIS — I313 Pericardial effusion (noninflammatory): Secondary | ICD-10-CM | POA: Insufficient documentation

## 2020-02-26 DIAGNOSIS — I482 Chronic atrial fibrillation, unspecified: Secondary | ICD-10-CM | POA: Diagnosis not present

## 2020-02-26 DIAGNOSIS — I272 Pulmonary hypertension, unspecified: Secondary | ICD-10-CM | POA: Insufficient documentation

## 2020-02-26 DIAGNOSIS — J45909 Unspecified asthma, uncomplicated: Secondary | ICD-10-CM | POA: Diagnosis not present

## 2020-02-26 DIAGNOSIS — I4819 Other persistent atrial fibrillation: Secondary | ICD-10-CM | POA: Diagnosis not present

## 2020-02-26 DIAGNOSIS — Z87891 Personal history of nicotine dependence: Secondary | ICD-10-CM | POA: Insufficient documentation

## 2020-02-26 DIAGNOSIS — Z79899 Other long term (current) drug therapy: Secondary | ICD-10-CM | POA: Diagnosis not present

## 2020-02-26 DIAGNOSIS — I5022 Chronic systolic (congestive) heart failure: Secondary | ICD-10-CM

## 2020-02-26 DIAGNOSIS — I5042 Chronic combined systolic (congestive) and diastolic (congestive) heart failure: Secondary | ICD-10-CM | POA: Insufficient documentation

## 2020-02-26 DIAGNOSIS — K922 Gastrointestinal hemorrhage, unspecified: Secondary | ICD-10-CM | POA: Insufficient documentation

## 2020-02-26 DIAGNOSIS — Z760 Encounter for issue of repeat prescription: Secondary | ICD-10-CM | POA: Diagnosis not present

## 2020-02-26 DIAGNOSIS — I5032 Chronic diastolic (congestive) heart failure: Secondary | ICD-10-CM | POA: Diagnosis not present

## 2020-02-26 DIAGNOSIS — I43 Cardiomyopathy in diseases classified elsewhere: Secondary | ICD-10-CM | POA: Diagnosis not present

## 2020-02-26 DIAGNOSIS — N184 Chronic kidney disease, stage 4 (severe): Secondary | ICD-10-CM | POA: Diagnosis not present

## 2020-02-26 DIAGNOSIS — D500 Iron deficiency anemia secondary to blood loss (chronic): Secondary | ICD-10-CM | POA: Diagnosis not present

## 2020-02-26 DIAGNOSIS — Z7901 Long term (current) use of anticoagulants: Secondary | ICD-10-CM | POA: Insufficient documentation

## 2020-02-26 LAB — CBC
HCT: 38 % (ref 36.0–46.0)
Hemoglobin: 12.5 g/dL (ref 12.0–15.0)
MCH: 32.2 pg (ref 26.0–34.0)
MCHC: 32.9 g/dL (ref 30.0–36.0)
MCV: 97.9 fL (ref 80.0–100.0)
Platelets: 194 10*3/uL (ref 150–400)
RBC: 3.88 MIL/uL (ref 3.87–5.11)
RDW: 13.2 % (ref 11.5–15.5)
WBC: 7.1 10*3/uL (ref 4.0–10.5)
nRBC: 0 % (ref 0.0–0.2)

## 2020-02-26 LAB — BASIC METABOLIC PANEL
Anion gap: 13 (ref 5–15)
BUN: 26 mg/dL — ABNORMAL HIGH (ref 8–23)
CO2: 29 mmol/L (ref 22–32)
Calcium: 9.2 mg/dL (ref 8.9–10.3)
Chloride: 96 mmol/L — ABNORMAL LOW (ref 98–111)
Creatinine, Ser: 2.04 mg/dL — ABNORMAL HIGH (ref 0.44–1.00)
GFR, Estimated: 24 mL/min — ABNORMAL LOW (ref >60)
Glucose, Bld: 104 mg/dL — ABNORMAL HIGH (ref 70–99)
Potassium: 3.6 mmol/L (ref 3.5–5.1)
Sodium: 138 mmol/L (ref 135–145)

## 2020-02-26 MED ORDER — APIXABAN 2.5 MG PO TABS: 2.5000 mg | ORAL_TABLET | Freq: Two times a day (BID) | ORAL | 11 refills | Status: DC

## 2020-02-26 MED ORDER — PREDNISONE 20 MG PO TABS: 20.0000 mg | ORAL_TABLET | Freq: Every day | ORAL | 0 refills | Status: DC

## 2020-02-26 MED ORDER — ALBUTEROL SULFATE HFA 108 (90 BASE) MCG/ACT IN AERS
1.0000 | INHALATION_SPRAY | Freq: Four times a day (QID) | RESPIRATORY_TRACT | 3 refills | Status: AC | PRN
Start: 2020-02-26 — End: ?

## 2020-02-26 NOTE — Patient Instructions (Addendum)
Labs done today. We will contact you only if your labs are abnormal.  START Eliquis 2.5mg  (1 tablet) by mouth 2 times daily.   START Prednisone 20mg  (1 tablet) by mouth daily for 5 days.  Your Albuterol was refilled and sent into your local Pharmacy.  No other medication changes were made. Please continue all other medications as prescribed.  You have been referred to [facility] in [place]. They will contact you to schedule an appointment.    Your physician recommends that you schedule a follow-up appointment in: 2 months  If you have any questions or concerns before your next appointment please send [place] a message through [place] or call our office at [phone].    TO LEAVE A MESSAGE FOR THE NURSE SELECT OPTION 2, PLEASE LEAVE A MESSAGE INCLUDING: . YOUR NAME . DATE OF BIRTH . CALL BACK NUMBER . REASON FOR CALL**this is important as we prioritize the call backs  YOU WILL RECEIVE A CALL BACK THE SAME DAY AS LONG AS YOU CALL BEFORE 4:00 PM   Do the following things EVERYDAY: 1) Weigh yourself in the morning before breakfast. Write it down and keep it in a log. 2) Take your medicines as prescribed 3) Eat low salt foods--Limit salt (sodium) to 2000 mg per day.  4) Stay as active as you can everyday 5) Limit all fluids for the day to less than 2 liters   At the [facility], you and your health needs are our priority. As part of our continuing mission to provide you with exceptional heart care, we have created designated Provider Care Teams. These Care Teams include your primary Cardiologist (physician) and Advanced Practice Providers (APPs- Physician Assistants and Nurse Practitioners) who all work together to provide you with the care you need, when you need it.   You may see any of the following providers on your designated Care Team at your next follow up: Marland Kitchen Dr Glori Bickers . Dr [name] . Darrick Grinder, NP . Lyda Jester, PA . Audry Riles, PharmD   Please be sure to bring in all your medications bottles to every appointment.

## 2020-02-27 NOTE — Progress Notes (Signed)
PCP: [name], MD HF Cardiology: Dr. Aundra Dubin  79 y.o. with history of CKD 4, chronic atrial fibrillation, anemia, and chronic systolic CHF was referred initially by [name] for evaluation of CHF.  Patient has a complex past history.  She has a long history of atrial fibrillation, now chronic as she has failed DCCV and Tikosyn and did not tolerate amiodarone.  Her last echo in 6/21 showed EF 35-40% with mild RV dysfunction, large pericardial effusion without tamponade, moderate MR, moderate TR.  Cause of cardiomyopathy is not certain.    She has been admitted multiple times this year to [facility]. She feels like hospitalizations have not helped her shortness of breath.  She was admitted to [facility] 10/29/19.  She was found to be anemic with hgb 6.4, FOBT+.  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  Hgb was 7.5 at time of discharge.  Eliquis was helped for several days, she was told to restart it on 8/11.    She was admitted again in 8/21 to [facility] with CHF exacerbation.  RHC in 8/21 showed elevated R>L filling pressures with mild pulmonary hypertension and low PVR. PAPi was low suggesting significant RV dysfunction.  Echo done today and reviewed showed EF 50-55% with septal hypokinesis, mild RV enlargement with mildly decreased RV systolic function, PASP 44 mmHg, moderate MR with 2 eccentric jets, moderate-severe TR, moderate lateral pericardial effusion (improved from prior echo).   PYP scan in 11/21 was grade 2 with H/CL 1.36. Negative myeloma panel and urine immunofixation.   She returns today for followup of CHF.  Weight is stable. She has a history of asthma and and has been wheezing recently, feels like she has an exacerbation.  No dyspnea walking on flat ground.  No orthopnea/PND.  No chest pain.  No palpitations. No BRBPR/melena.    ECG (personally reviewed): atrial fibrillation at 78, LAFB  Labs (8/21): K 3.9, creatinine 2.35, hgb 7.5, FOBT+ Labs (9/21): K 3.7, creatinine  2.44 Labs (10/21): hgb 11.9, K 3.5, creatinine 1.97 Las (11/21): hgb 12.3  PMH: 1. CKD stage 4 2. Chronic atrial fibrillation: Failed DCCV in 11/18.  - Failed Tikosyn in 9/19.  - Intolerant of calcium channel blockers.  - Intolerant of amiodarone.  3. HTN 4. Asthma 5. GERD 6. Chronic systolic => diastolic CHF: Uncertain etiology.  - Echo (6/21): EF 35-40%, global hypokinesis, mildly decreased RV systolic function with mild RV dilation, biatrial enlargement, large pericardial effusion without tamponade, moderate MR, moderate TR, IVC dilated.  - RHC (8/21): mean RA 18, PA 35/18, mean PCWP 16, CI 3.6, PVR 1.5 WU, PAPi 0.9 => primarily RV failure - V/Q scan (8/21): Not suggestive of chronic PEs - Echo (10/21): EF 50-55% with septal hypokinesis, mild RV enlargement with mildly decreased RV systolic function, PASP 44 mmHg, moderate MR with 2 eccentric jets, moderate-severe TR, moderate lateral pericardial effusion (improved from prior echo)  - PYP scan (11/21): grade 2 with H/CL 1.36.  Myeloma panel negative, urine immunofixation negative.  7. Anemia of chronic renal disease.  8. Breast cancer: Bilateral mastectomy.  9. GI bleeding: Admission in 8/21. EGD/c-scope in 6/21 negative.   Social History   Socioeconomic History  . Marital status: Married    Spouse name: [name]  . Number of children: 1  . Years of education: Not on file  . Highest education level: Not on file  Occupational History  . Occupation: retired  Tobacco Use  . Smoking status: Former Smoker  Packs/day: 1.00    Years: 28.00    Pack years: 28.00    Types: Cigarettes    Quit date: 07/17/1983    Years since quitting: 36.6  . Smokeless tobacco: Never Used  Vaping Use  . Vaping Use: Never used  Substance and Sexual Activity  . Alcohol use: No  . Drug use: No  . Sexual activity: Not on file  Other Topics Concern  . Not on file  Social History Narrative  . Not on file   Social Determinants of Health    Financial Resource Strain:   . Difficulty of Paying Living Expenses: Not on file  Food Insecurity: No Food Insecurity  . Worried About Charity fundraiser in the Last Year: Never true  . Ran Out of Food in the Last Year: Never true  Transportation Needs: No Transportation Needs  . Lack of Transportation (Medical): No  . Lack of Transportation (Non-Medical): No  Physical Activity:   . Days of Exercise per Week: Not on file  . Minutes of Exercise per Session: Not on file  Stress:   . Feeling of Stress : Not on file  Social Connections: Moderately Integrated  . Frequency of Communication with Friends and Family: Once a week  . Frequency of Social Gatherings with Friends and Family: Once a week  . Attends Religious Services: 1 to 4 times per year  . Active Member of Clubs or Organizations: No  . Attends Archivist Meetings: 1 to 4 times per year  . Marital Status: Married  Human resources officer Violence:   . Fear of Current or Ex-Partner: Not on file  . Emotionally Abused: Not on file  . Physically Abused: Not on file  . Sexually Abused: Not on file   Family History  Problem Relation Age of Onset  . Cancer Mother   . Aneurysm Father   . Cancer Sister   . Cancer Sister   . Anesthesia problems Neg Hx   . Hypotension Neg Hx   . Malignant hyperthermia Neg Hx   . Pseudochol deficiency Neg Hx   . Colon cancer Neg Hx    ROS: All systems reviewed and negative except as per HPI.  Current Outpatient Medications  Medication Sig Dispense Refill  . acetaminophen (TYLENOL) 650 MG CR tablet Take 650 mg by mouth every 8 (eight) hours as needed for pain.     Marland Kitchen albuterol (VENTOLIN HFA) 108 (90 Base) MCG/ACT inhaler Inhale 1-2 puffs into the lungs every 6 (six) hours as needed for wheezing or shortness of breath. 18 g 3  . ALPRAZolam (XANAX) 0.5 MG tablet Take 0.5 mg by mouth at bedtime.     . bisoprolol (ZEBETA) 5 MG tablet Take 0.5 tablets (2.5 mg total) by mouth daily. 45 tablet 3  .  calcium citrate-vitamin D (CITRACAL+D) 315-200 MG-UNIT tablet Take 1 tablet by mouth 2 (two) times daily.    . fluticasone (FLONASE) 50 MCG/ACT nasal spray Place 2 sprays into both nostrils daily. 16 g 0  . loperamide (IMODIUM) 2 MG capsule Take 1 capsule (2 mg total) by mouth every 8 (eight) hours as needed for diarrhea or loose stools. 15 capsule 0  . Multiple Vitamins-Minerals (MULTIVITAMINS THER. W/MINERALS) TABS tablet Take 1 tablet by mouth daily.    Marland Kitchen omeprazole (PRILOSEC) 40 MG capsule Take 40 mg by mouth as needed.    . potassium chloride SA (KLOR-CON) 20 MEQ tablet Take 1 tablet (20 mEq total) by mouth 2 (two) times daily. Start  on 09/28/2019 when you restart Lasix 60 tablet 3  . torsemide (DEMADEX) 20 MG tablet Take 3 tablets (60 mg total) by mouth 2 (two) times daily. 540 tablet 3  . vitamin B-12 (CYANOCOBALAMIN) 500 MCG tablet Take 500 mcg by mouth daily.    Marland Kitchen apixaban (ELIQUIS) 2.5 MG TABS tablet Take 1 tablet (2.5 mg total) by mouth 2 (two) times daily. 60 tablet 11  . predniSONE (DELTASONE) 20 MG tablet Take 1 tablet (20 mg total) by mouth daily with breakfast for 5 days. 5 tablet 0   No current facility-administered medications for this encounter.   BP (!) 144/78   Pulse 92   Ht 5\' 4"  (1.626 m)   Wt 56.4 kg (124 lb 6.4 oz)   SpO2 97%   BMI 21.35 kg/m  General: NAD Neck: No JVD, no thyromegaly or thyroid nodule.  Lungs: Bilateral wheezes CV: Nondisplaced PMI.  Heart regular S1/S2, no S3/S4, no murmur.  No peripheral edema.  No carotid bruit.  Normal pedal pulses.  Abdomen: Soft, nontender, no hepatosplenomegaly, no distention.  Skin: Intact without lesions or rashes.  Neurologic: Alert and oriented x 3.  Psych: Normal affect. Extremities: No clubbing or cyanosis.  HEENT: Normal.   Assessment/Plan: 1. Chronic systolic => diastolic CHF:  Prominent RV failure.  Echo in 6/21 with EF 35-40% with mild RV dysfunction, large pericardial effusion without tamponade, moderate MR,  moderate TR.  RHC in 8/21 with preserved cardiac output, primarily RV failure, mild pulmonary hypertension with low PVR. V/Q scan in 8/21 not suggestive of chronic PEs.  Echo in 10/21 with EF 50-55% with septal hypokinesis, mild RV enlargement with mildly decreased RV systolic function, PASP 44 mmHg, moderate MR with 2 eccentric jets, moderate-severe TR, moderate lateral pericardial effusion (improved from prior echo).  PYP scan was equivocal for TTR cardiac amyloidosis (grade 2 but H/CL < 1.5).  Cause of cardiomyopathy is not certain. No chest pain.  Volume status is much better currently.  NYHA class II symptoms.  - Continue torsemide 60 mg bid, BMET today.   - Continue KCl 20 bid.   - Continue bisoprolol 2.5 mg daily.  - No digoxin, Entresto, spironolactone with improved LV EF and CKD stage IV.  - With equivocal PYP scan, I will repeat PYP in 6 months.  In the meantime, I will send [facility] genetic testing for TTR amyloidosis.  2. Atrial fibrillation: Chronic, has failed DCCV and Tikosyn.  She has not tolerated amiodarone.  Rate control good on bisoprolol 2.5 mg daily.  - Continue bisoprolol 2.5 daily.  - With excellent recent hgb and no overt bleeding, I will start her back on Eliquis 2.5 mg bid.   3. Anemia: Multifactorial, suspect due to renal disease as well as GI bleeding.  Hgb down to 6.5 in 8/21 in setting of GI bleeding with FOBT+.  Hgb up to 12.3 in 11/21.    - Would like her to see GI for possible capsule endoscopy - No blood products (Jehovah's Witness).  - Following with hematology for Aranesp/feraheme.  - At this point, I think it is reasonable for her to start apixaban 2.5 mg bid as above.  4. CKD: Stage IV.  Watch creatinine closely with diuresis. She has a nephrologist.  - BMET today.  5. Pericardial effusion: Large on 6/21 echo without tamponade, echo in 10/21 showed pericardial effusion moderate laterally (smaller).  6. Asthma: Suspect exacerbation with prominent wheezing.  -  Refill albuterol.  - I will give him a  5 day prednisone burst.  - Pulmonary referral for asthma management.   [name] 02/27/2020

## 2020-02-29 ENCOUNTER — Encounter (HOSPITAL_COMMUNITY)
Admission: RE | Admit: 2020-02-29 | Discharge: 2020-02-29 | Disposition: A | Discharge status: Home / Self Care | Payer: Medicare Other | Source: Ambulatory Visit | Attending: Nephrology | Admitting: Nephrology

## 2020-02-29 ENCOUNTER — Encounter (HOSPITAL_COMMUNITY): Payer: Self-pay

## 2020-02-29 ENCOUNTER — Other Ambulatory Visit: Payer: Self-pay

## 2020-02-29 DIAGNOSIS — N184 Chronic kidney disease, stage 4 (severe): Secondary | ICD-10-CM | POA: Diagnosis not present

## 2020-02-29 DIAGNOSIS — D631 Anemia in chronic kidney disease: Secondary | ICD-10-CM | POA: Insufficient documentation

## 2020-02-29 LAB — POCT HEMOGLOBIN-HEMACUE: Hemoglobin: 12.9 g/dL (ref 12.0–15.0)

## 2020-02-29 MED ORDER — EPOETIN ALFA 3000 UNIT/ML IJ SOLN: 3000.0000 [IU] | Freq: Once | INTRAMUSCULAR | Status: DC

## 2020-03-02 ENCOUNTER — Ambulatory Visit (INDEPENDENT_AMBULATORY_CARE_PROVIDER_SITE_OTHER): Payer: Medicare Other | Admitting: Pulmonary Disease

## 2020-03-02 ENCOUNTER — Other Ambulatory Visit: Payer: Self-pay

## 2020-03-02 ENCOUNTER — Encounter: Payer: Self-pay | Admitting: Pulmonary Disease

## 2020-03-02 VITALS — BP 122/72 | HR 88 | Temp 97.8°F | Ht 64.0 in | Wt 120.0 lb

## 2020-03-02 DIAGNOSIS — R062 Wheezing: Secondary | ICD-10-CM

## 2020-03-02 MED ORDER — PREDNISONE 10 MG PO TABS: ORAL_TABLET | ORAL | 0 refills | Status: AC

## 2020-03-02 MED ORDER — MONTELUKAST SODIUM 10 MG PO TABS: 10.0000 mg | ORAL_TABLET | Freq: Every day | ORAL | 5 refills | Status: DC

## 2020-03-02 MED ORDER — MOMETASONE FURO-FORMOTEROL FUM 100-5 MCG/ACT IN AERO: 2.0000 | INHALATION_SPRAY | Freq: Two times a day (BID) | RESPIRATORY_TRACT | 0 refills | Status: DC

## 2020-03-02 NOTE — Patient Instructions (Signed)
Mometasone-formoterol (dulera) two puffs in the morning and two puffs in the evening and rinse your mouth after each use  Montelukast (singulair) 10 mg pill nightly  Proair two puffs every 6 hours as needed for cough, wheeze, chest congestion, or shortness of breath  Prednisone 10 mg pill >> 2 pills for 2 days, 1 pill for 2 days, 1/2 pill for 2 days  Will arrange for pulmonary function test  Follow up in 4 weeks

## 2020-03-02 NOTE — Progress Notes (Signed)
[facility]  Chief Complaint  Patient presents with  . Consult    "feels like I have cold in my chest" productive cough with clear phlegm    Constitutional:  BP 122/72 (BP Location: Left Arm, Cuff Size: Normal)   Pulse 88   Temp 97.8 F (36.6 C) (Other (Comment)) Comment (Src): wrist  Ht 5\' 4"  (1.626 m)   Wt 120 lb (54.4 kg)   SpO2 99% Comment: Room air  BMI 20.60 kg/m   Past Medical History:  CKD 4, A fib, Anemia, CHF, Valvular heart disease, Anxiety, Breast cancer 2001, HTN, GERD, Psoriasis  Past Surgical History:  Her  has a past surgical history that includes Septoplasty (1995); Mastectomy; Cataract extraction w/PHACO (07/23/2011); Cholecystectomy (N/A, 02/14/2015); Cardioversion (N/A, 02/18/2017); Cardioversion (N/A, 12/04/2017); Esophagogastroduodenoscopy (N/A, 04/22/2018); biopsy (N/A, 04/22/2018); Colonoscopy (N/A, 04/22/2018); polypectomy (04/22/2018); Cataract extraction w/PHACO (Left, 11/13/2018); Colonoscopy with propofol (N/A, 09/06/2019); Esophagogastroduodenoscopy (egd) with propofol (N/A, 09/06/2019); Esophageal dilation (09/06/2019); polypectomy (09/06/2019); biopsy (09/06/2019); Givens capsule study (N/A, 10/31/2019); and RIGHT HEART CATH (N/A, 11/09/2019).  Brief Summary:  Bailey Bishop is a 79 y.o. female former smoker with wheezing.  She is [employer].      Subjective:   She has history of allergies and asthma.  She used to smoke cigarettes and her husband was a heavy smoker.  She started getting sinus congestion and drainage a couple months ago.  This settled into her chest.  She then started getting wheeze and chest tightness.  She had cough with clear sputum.  She was started on proair and prednisone.  These helped, but wheezing persists.  She also noticed her symptoms started around the time she was started on bisoprolol.  She is from [place] but has lived in [place] for 75 years.  She worked in [employer] with  lots of dust exposure.  No prior allergy testing.  No food allergies.  Denies allergy to aspirin.  No history of pneumonia or TB.  No bird exposures.  Physical Exam:   Appearance - well kempt   ENMT - no sinus tenderness, no oral exudate, no LAN, Mallampati 2 airway, no stridor, poor dentition  Respiratory - prolonged exhalation, b/l wheezing  CV - s1s2 regular rate and rhythm, 2/6 SM  Ext - no clubbing, no edema  Skin - no rashes  Psych - normal mood and affect   Pulmonary testing:    Chest Imaging:   V/Q scan 11/06/19 >> no findings of PE  Sleep Tests:    Cardiac Tests:   Echo 01/06/20 >> EF 50 to 55%, mild RV enlargement, RVSP 43.8 mmHg, mod MR, mod LA dilation, severe RV dilation, mod/severe TR, mod pericardial effusion  Social History:  She  reports that she quit smoking about 36 years ago. Her smoking use included cigarettes. She has a 28.00 pack-year smoking history. She has never used smokeless tobacco. She reports that she does not drink alcohol and does not use drugs.  Family History:  Her family history includes Aneurysm in her father; Cancer in her mother, sister, and sister.    Discussion:  She has history of allergies and asthma.  She had persistent wheezing with productive cough likely from asthma.  She reports her symptoms have progressed since she was started on bisoprolol.  Assessment/Plan:   Allergic asthma with acute exacerbation. - will give he another course of prednisone - will have her start dulera and singulair - continue prn proair - will arrange for PFT -  might need additional allergy testing  Systolic CHF, A fib, Valvular heart disease, Pericardial effusion. - followed by Dr. [name] with [facility] Heart Failure team - if symptoms of wheezing persist, then might need to consider trial off bisoprolol  Normocytic anemia, Rt breast cancer. - followed by [name] with Hematology/Oncology  CKD 4. - followed by Dr. Ulice Bold with [facility]  Time Spent Involved in Patient Care on Day of Examination:  34 minutes  Follow up:  Patient Instructions  Mometasone-formoterol (dulera) two puffs in the morning and two puffs in the evening and rinse your mouth after each use  Montelukast (singulair) 10 mg pill nightly  Proair two puffs every 6 hours as needed for cough, wheeze, chest congestion, or shortness of breath  Prednisone 10 mg pill >> 2 pills for 2 days, 1 pill for 2 days, 1/2 pill for 2 days  Will arrange for pulmonary function test  Follow up in 4 weeks   Medication List:   Allergies as of 03/02/2020      Reactions   Feraheme [ferumoxytol] Shortness Of Breath   Cardizem Cd [diltiazem Hcl Er Beads] Diarrhea   Amiodarone Nausea And Vomiting   Metoprolol Tartrate Diarrhea   Sulfa Antibiotics Nausea And Vomiting   Tomato Other (See Comments)   Burns stomach   Carvedilol Diarrhea, Rash   Chocolate Rash, Other (See Comments)   Dark chocolate      Medication List       Accurate as of March 02, 2020 11:03 AM. If you have any questions, ask your nurse or doctor.        acetaminophen 650 MG CR tablet Commonly known as: TYLENOL Take 650 mg by mouth every 8 (eight) hours as needed for pain.   albuterol 108 (90 Base) MCG/ACT inhaler Commonly known as: VENTOLIN HFA Inhale 1-2 puffs into the lungs every 6 (six) hours as needed for wheezing or shortness of breath.   ALPRAZolam 0.5 MG tablet Commonly known as: XANAX Take 0.5 mg by mouth at bedtime.   apixaban 2.5 MG Tabs tablet Commonly known as: ELIQUIS Take 1 tablet (2.5 mg total) by mouth 2 (two) times daily.   bisoprolol 5 MG tablet Commonly known as: ZEBETA Take 0.5 tablets (2.5 mg total) by mouth daily.   calcium citrate-vitamin D 315-200 MG-UNIT tablet Commonly known as: CITRACAL+D Take 1 tablet by mouth 2 (two) times daily.   fluticasone 50 MCG/ACT nasal spray Commonly known as: FLONASE Place 2 sprays into  both nostrils daily.   loperamide 2 MG capsule Commonly known as: IMODIUM Take 1 capsule (2 mg total) by mouth every 8 (eight) hours as needed for diarrhea or loose stools.   mometasone-formoterol 100-5 MCG/ACT Aero Commonly known as: DULERA Inhale 2 puffs into the lungs in the morning and at bedtime. Started by: Chesley Mires, MD   montelukast 10 MG tablet Commonly known as: SINGULAIR Take 1 tablet (10 mg total) by mouth at bedtime. Started by: Chesley Mires, MD   multivitamins ther. w/minerals Tabs tablet Take 1 tablet by mouth daily.   omeprazole 40 MG capsule Commonly known as: PRILOSEC Take 40 mg by mouth as needed.   potassium chloride SA 20 MEQ tablet Commonly known as: KLOR-CON Take 1 tablet (20 mEq total) by mouth 2 (two) times daily. Start on 09/28/2019 when you restart Lasix   predniSONE 10 MG tablet Commonly known as: DELTASONE Take 2 tablets (20 mg total) by mouth daily with breakfast for 2 days, THEN  1 tablet (10 mg total) daily with breakfast for 2 days, THEN 0.5 tablets (5 mg total) daily with breakfast for 2 days. Start taking on: March 02, 2020 What changed:   medication strength  See the new instructions. Changed by: Chesley Mires, MD   torsemide 20 MG tablet Commonly known as: DEMADEX Take 3 tablets (60 mg total) by mouth 2 (two) times daily.   vitamin B-12 500 MCG tablet Commonly known as: CYANOCOBALAMIN Take 500 mcg by mouth daily.       Signature:  Chesley Mires, MD [place] Pager - [phone] 03/02/2020, 11:03 AM

## 2020-03-11 ENCOUNTER — Other Ambulatory Visit: Payer: Self-pay

## 2020-03-11 ENCOUNTER — Other Ambulatory Visit (HOSPITAL_COMMUNITY)
Admission: RE | Admit: 2020-03-11 | Discharge: 2020-03-11 | Disposition: A | Discharge status: Home / Self Care | Payer: Medicare Other | Source: Ambulatory Visit | Attending: Pulmonary Disease | Admitting: Pulmonary Disease

## 2020-03-11 DIAGNOSIS — Z20822 Contact with and (suspected) exposure to covid-19: Secondary | ICD-10-CM | POA: Diagnosis not present

## 2020-03-11 DIAGNOSIS — Z01812 Encounter for preprocedural laboratory examination: Secondary | ICD-10-CM | POA: Insufficient documentation

## 2020-03-12 LAB — SARS CORONAVIRUS 2 (TAT 6-24 HRS): SARS Coronavirus 2: NEGATIVE

## 2020-03-14 ENCOUNTER — Encounter (HOSPITAL_COMMUNITY): Payer: Self-pay

## 2020-03-14 ENCOUNTER — Encounter (HOSPITAL_COMMUNITY)
Admission: RE | Admit: 2020-03-14 | Discharge: 2020-03-14 | Disposition: A | Discharge status: Home / Self Care | Payer: Medicare Other | Source: Ambulatory Visit | Attending: Nephrology | Admitting: Nephrology

## 2020-03-14 ENCOUNTER — Other Ambulatory Visit: Payer: Self-pay

## 2020-03-14 DIAGNOSIS — D631 Anemia in chronic kidney disease: Secondary | ICD-10-CM | POA: Diagnosis not present

## 2020-03-14 DIAGNOSIS — N184 Chronic kidney disease, stage 4 (severe): Secondary | ICD-10-CM | POA: Diagnosis not present

## 2020-03-14 LAB — POCT HEMOGLOBIN-HEMACUE: Hemoglobin: 12 g/dL (ref 12.0–15.0)

## 2020-03-14 MED ORDER — EPOETIN ALFA 3000 UNIT/ML IJ SOLN: 3000.0000 [IU] | Freq: Once | INTRAMUSCULAR | Status: DC

## 2020-03-15 ENCOUNTER — Ambulatory Visit (HOSPITAL_COMMUNITY)
Admission: RE | Admit: 2020-03-15 | Discharge: 2020-03-15 | Disposition: A | Discharge status: Home / Self Care | Payer: Medicare Other | Source: Ambulatory Visit | Attending: Pulmonary Disease | Admitting: Pulmonary Disease

## 2020-03-15 ENCOUNTER — Other Ambulatory Visit: Payer: Self-pay

## 2020-03-15 DIAGNOSIS — R062 Wheezing: Secondary | ICD-10-CM | POA: Insufficient documentation

## 2020-03-15 LAB — PULMONARY FUNCTION TEST
FEF 25-75 Pre: 0.73 L/sec
FEF2575-%Pred-Pre: 53 %
FEV1-%Pred-Pre: 65 %
FEV1-Pre: 1.26 L
FEV1FVC-%Pred-Pre: 94 %
FEV6-%Pred-Pre: 74 %
FEV6-Pre: 1.81 L
FEV6FVC-%Pred-Pre: 105 %
FVC-%Pred-Pre: 70 %
FVC-Pre: 1.81 L
Pre FEV1/FVC ratio: 69 %
Pre FEV6/FVC Ratio: 100 %
RV % pred: 76 %
RV: 1.85 L
TLC % pred: 75 %
TLC: 3.83 L

## 2020-03-29 ENCOUNTER — Encounter (HOSPITAL_COMMUNITY)
Admission: RE | Admit: 2020-03-29 | Discharge: 2020-03-29 | Disposition: A | Discharge status: Home / Self Care | Payer: Medicare Other | Source: Ambulatory Visit | Attending: Nephrology | Admitting: Nephrology

## 2020-03-29 ENCOUNTER — Encounter (HOSPITAL_COMMUNITY): Payer: Self-pay

## 2020-03-29 ENCOUNTER — Other Ambulatory Visit (HOSPITAL_COMMUNITY): Payer: Medicare Other

## 2020-03-29 ENCOUNTER — Other Ambulatory Visit: Payer: Self-pay

## 2020-03-29 DIAGNOSIS — N184 Chronic kidney disease, stage 4 (severe): Secondary | ICD-10-CM | POA: Insufficient documentation

## 2020-03-29 DIAGNOSIS — D649 Anemia, unspecified: Secondary | ICD-10-CM

## 2020-03-29 DIAGNOSIS — D631 Anemia in chronic kidney disease: Secondary | ICD-10-CM | POA: Diagnosis not present

## 2020-03-29 LAB — CBC WITH DIFFERENTIAL/PLATELET
Abs Immature Granulocytes: 0.01 10*3/uL (ref 0.00–0.07)
Basophils Absolute: 0.1 10*3/uL (ref 0.0–0.1)
Basophils Relative: 1 %
Eosinophils Absolute: 0.6 10*3/uL — ABNORMAL HIGH (ref 0.0–0.5)
Eosinophils Relative: 10 %
HCT: 39.6 % (ref 36.0–46.0)
Hemoglobin: 12.7 g/dL (ref 12.0–15.0)
Immature Granulocytes: 0 %
Lymphocytes Relative: 30 %
Lymphs Abs: 2 10*3/uL (ref 0.7–4.0)
MCH: 32.3 pg (ref 26.0–34.0)
MCHC: 32.1 g/dL (ref 30.0–36.0)
MCV: 100.8 fL — ABNORMAL HIGH (ref 80.0–100.0)
Monocytes Absolute: 0.6 10*3/uL (ref 0.1–1.0)
Monocytes Relative: 9 %
Neutro Abs: 3.3 10*3/uL (ref 1.7–7.7)
Neutrophils Relative %: 50 %
Platelets: 198 10*3/uL (ref 150–400)
RBC: 3.93 MIL/uL (ref 3.87–5.11)
RDW: 12.6 % (ref 11.5–15.5)
WBC: 6.7 10*3/uL (ref 4.0–10.5)
nRBC: 0 % (ref 0.0–0.2)

## 2020-03-29 LAB — IRON AND TIBC
Iron: 111 ug/dL (ref 28–170)
Saturation Ratios: 31 % (ref 10.4–31.8)
TIBC: 359 ug/dL (ref 250–450)
UIBC: 248 ug/dL

## 2020-03-29 LAB — POCT HEMOGLOBIN-HEMACUE: Hemoglobin: 12.5 g/dL (ref 12.0–15.0)

## 2020-03-29 LAB — MAGNESIUM: Magnesium: 2.3 mg/dL (ref 1.7–2.4)

## 2020-03-29 LAB — VITAMIN B12: Vitamin B-12: 1225 pg/mL — ABNORMAL HIGH (ref 180–914)

## 2020-03-29 LAB — FERRITIN: Ferritin: 146 ng/mL (ref 11–307)

## 2020-03-29 MED ORDER — EPOETIN ALFA 3000 UNIT/ML IJ SOLN: 3000.0000 [IU] | Freq: Once | INTRAMUSCULAR | Status: DC

## 2020-03-30 NOTE — Progress Notes (Unsigned)
Cardiology Office Note  Date: 03/31/2020   ID: Bailey Bishop, DOB 06-08-1940, MRN [id number]  PCP:  [name], MD  Cardiologist:  [name], MD Electrophysiologist:  [name], MD   Chief Complaint: Dyspnea on exertion/shortness of breath, persistent atrial fibrillation, HTN, HFrEF, Stage IV Renal disease, anemia likely secondary to renal disease.  RV failure.  Recent right heart cath and echocardiogram.    History of Present Illness: Bailey Bishop is a 80 y.o. female with a history of chronic diastolic heart failure, persistent atrial fibrillation (status post unsuccessful DCCV in 02/12/2017 intolerant to amiodarone in the past, started on Tikosyn 11/2017 and failed therapy, intolerant to Lopressor, Toprol, Coreg, Cardizem and Cardizem CD in the past), hypertension, asthma, HTN, asthma, arthritis, GERD, CKD stage 4.  Had seen [name] 02/13/2019 and he reported intermittent palpitations but no progressive symptoms.  He reviewed options for PPM placement and AV node ablation.  These options were not pursued at that time due to the patient being not overly symptomatic.  She called the office 05/16/2019 reporting frequent UTIs and asthma thought to be secondary to her verapamil.  She wished to stop verapamil and restart amiodarone.  [name] at atrial fibrillation clinic had her stop verapamil and restart amiodarone 200 mg daily on 04/30/2019.  She showed rate controlled atrial fibrillation after restarting.  Last saw [name], [place] on 07/14/2019.  Feeling well overall and great on amiodarone.  She did report some palpitations but heart rate was typically in the 60s to 80s when checked at home   Recent [name] emergency room visit on 09/10/2019 for shortness of breath.  She had been recently admitted for shortness of breath felt to be related to decreased hemoglobin unexplained via colonoscopy and endoscopy.  She described feeling as bad as when she was admitted.   Hx of at night sleeping sitting on the side of the bed to catch her breath on several occasions throughout the night.  She had evidence of possible pneumonia on her chest x-ray and a slightly elevated BNP along with pedal edema.   Echocardiogram on 09/18/2019 showed decreased LV EF 35 to 40%.  Global hypokinesis, left ventricular diastolic parameters indeterminate.  Mild left atrial dilation, right atrium severely dilated.  Large pericardial effusion.  Moderate mitral valve and tricuspid valve regurgitation She was treated with Zithromax and an increased dose of Lasix.  Patient was concerned that she had been on amiodarone and may have caused lung damage as a result.  A referral was placed for pulmonology for consideration of pulmonary function testing.  She had a creatinine of 1.65 and GFR of 29.  Hemoglobin 8.5 and hematocrit 27.3.  Troponin of 12, BNP 848.   Renal function had been steadily decreasing over the prior 8 days. Creatinine was 2.35, GFR was 19.  She had never seen a nephrologist.  She complained of increased weight gain approximately 7 pounds since 09/10/2019 when she weighed 145 pounds.  On prior visit she weighed 152 pounds.  Her Lasix dose was decreased on discharge from recent hospital visit to 20 mg daily.  Husband stated she she was getting significantly short of breath when performing normal activities.  Recently admitted to [facility] on 11/04/2019 with primary diagnosis of acute on chronic combined systolic and diastolic heart failure with predominant RV failure, moderate to large pericardial effusion without tamponade, chronic atrial fibrillation, chronic anemia, stage IV CKD.  Started on IV Lasix.  Had a stat echo  to reassess pericardial effusion.  Study showed EF 50% with septal hypokinesis, RV severely enlarged with mildly decreased systolic function and D-shaped septum, mild to moderate MR, severe TR with incomplete coaptation of tricuspid valve.  IVC dilated.  Pericardial effusion  was moderate to large but primarily lateral and inferior.  No evidence of tamponade.  She responded well to IV diuretics.  Right heart cath consistent with advanced RV failure.  [name] was unsure if this was primary RV issue versus burned-out PAH.  VQ scan negative for acute or chronic PEs.  PFTs were ordered and were pending.  Concern for possible cardiac amyloid however patient declined cardiac MRI.  History of anemia and CBC was followed closely and hemoglobin trended up.  Eliquis was discontinued given her chronic anemia and bleeding risk with refusal to accept blood products secondary to being Jehovah's Witness.  At last visit she stated she felt great.  She stated she felt like [name] and his colleagues saved her life.  She denied any anginal or exertional symptoms, orthostatic symptoms, PND, orthopnea, CVA or TIA-like symptoms, DVT or PE-like symptoms, or lower extremity edema.  Her weight was stable around 125 to 126 pounds.  She had a pending appointment with nephrology for stage IV renal disease.  She recently had 2 iron infusions with [name] and stated her hemoglobin was up to 11 at this point.  She stated she was weighing herself every day and taking her diuretics as ordered.  She had a follow-up soon with Dr. Aundra Dubin on October 13 as well as a follow-up echocardiogram.  She is here for 58-month follow-up today.  Again she states she feels great and better than she has felt in quite a long time.  She denies any anginal or exertional symptoms, sensation of palpitations or arrhythmias although she is in atrial fibrillation.  Denies any orthostatic symptoms, CVA or TIA-like symptoms, bleeding, PND, orthopnea, lower extremity edema, DVT or PE-like symptoms.  States she recently had pulmonary function studies and has follow-up with pulmonologist Dr. Halford Chessman later on in January.  States her anemia is improving with iron infusions and she continues to see [name] for anemia.  She has  a follow-up at the heart failure clinic with Dr. Aundra Dubin in February.  She dates her weights are remaining stable.   Past Medical History:  Diagnosis Date  . Anemia   . Anxiety   . Arthritis   . Asthma   . Atrial fibrillation ([place])    a. s/p unsuccessful DCCV in 01/2017 and intolerant to Amiodarone --> Rate-control strategy pursued since b. failed Tikosyn in 11/2017  . Breast cancer ([place]) 2001   Right  . CHF (congestive heart failure) ([place])   . Chronic kidney disease   . Essential hypertension   . GERD (gastroesophageal reflux disease)   . T10 vertebral fracture [facility])     Past Surgical History:  Procedure Laterality Date  . BIOPSY N/A 04/22/2018   Procedure: BIOPSY;  Surgeon: Aviva Signs, MD;  [facility];  Service: Gastroenterology;  Laterality: N/A;  . BIOPSY  09/06/2019   Procedure: BIOPSY;  Surgeon: Daneil Dolin, MD;  [facility];  Service: Endoscopy;;  gastric  . CARDIOVERSION N/A 02/18/2017   Procedure: CARDIOVERSION;  Surgeon: Josue Hector, MD;  Location: [facility] ENDOSCOPY;  Service: Cardiovascular;  Laterality: N/A;  . CARDIOVERSION N/A 12/04/2017   Procedure: CARDIOVERSION;  Surgeon: [name], MD;  Location: [place];  Service: Cardiovascular;  Laterality: N/A;  .  CATARACT EXTRACTION W/PHACO  07/23/2011   Procedure: CATARACT EXTRACTION PHACO AND INTRAOCULAR LENS PLACEMENT (IOC);  Surgeon: Williams Che, MD;  Location: [facility];  Service: Ophthalmology;  Laterality: Right;  CDE:9.96  . CATARACT EXTRACTION W/PHACO Left 11/13/2018   Procedure: CATARACT EXTRACTION PHACO AND INTRAOCULAR LENS PLACEMENT (IOC);  Surgeon: Baruch Goldmann, MD;  Location: [facility];  Service: Ophthalmology;  Laterality: Left;  left, CDE: 7.44  . CHOLECYSTECTOMY N/A 02/14/2015   Procedure: LAPAROSCOPIC CHOLECYSTECTOMY;  Surgeon: Aviva Signs, MD;  Location: [facility];  Service: General;  Laterality: N/A;  . COLONOSCOPY N/A 04/22/2018   Procedure: COLONOSCOPY;  Surgeon:  Aviva Signs, MD;  [facility];  Service: Gastroenterology;  Laterality: N/A;  . COLONOSCOPY WITH PROPOFOL N/A 09/06/2019   Procedure: COLONOSCOPY WITH PROPOFOL;  Surgeon: Daneil Dolin, MD;  [facility];  Service: Endoscopy;  Laterality: N/A;  . ESOPHAGEAL DILATION  09/06/2019   Procedure: ESOPHAGEAL DILATION;  Surgeon: Daneil Dolin, MD;  [facility];  Service: Endoscopy;;  . ESOPHAGOGASTRODUODENOSCOPY N/A 04/22/2018   Procedure: ESOPHAGOGASTRODUODENOSCOPY (EGD);  Surgeon: Aviva Signs, MD;  [facility];  Service: Gastroenterology;  Laterality: N/A;  . ESOPHAGOGASTRODUODENOSCOPY (EGD) WITH PROPOFOL N/A 09/06/2019   Procedure: ESOPHAGOGASTRODUODENOSCOPY (EGD) WITH PROPOFOL;  Surgeon: Daneil Dolin, MD;  [facility];  Service: Endoscopy;  Laterality: N/A;  . GIVENS CAPSULE STUDY N/A 10/31/2019   Procedure: GIVENS CAPSULE STUDY;  Surgeon: Harvel Quale, MD;  [facility];  Service: Gastroenterology;  Laterality: N/A;  . MASTECTOMY     bilateral mastectomy-right breast cancer-left taken by choice  . POLYPECTOMY  04/22/2018   Procedure: POLYPECTOMY;  Surgeon: Aviva Signs, MD;  [facility];  Service: Gastroenterology;;  . POLYPECTOMY  09/06/2019   Procedure: POLYPECTOMY;  Surgeon: Daneil Dolin, MD;  [facility];  Service: Endoscopy;;  cecal, ascending, hepatic flexure, sigmoid  . RIGHT HEART CATH N/A 11/09/2019   Procedure: RIGHT HEART CATH;  Surgeon: Jolaine Artist, MD;  Location: [place] CV LAB;  Service: Cardiovascular;  Laterality: N/A;  . SEPTOPLASTY  1995    Current Outpatient Medications  Medication Sig Dispense Refill  . acetaminophen (TYLENOL) 650 MG CR tablet Take 650 mg by mouth every 8 (eight) hours as needed for pain.     Marland Kitchen albuterol (VENTOLIN HFA) 108 (90 Base) MCG/ACT inhaler Inhale 1-2 puffs into the lungs every 6 (six) hours as needed for wheezing or shortness  of breath. 18 g 3  . ALPRAZolam (XANAX) 0.5 MG tablet Take 0.5 mg by mouth at bedtime.    Marland Kitchen apixaban (ELIQUIS) 2.5 MG TABS tablet Take 1 tablet (2.5 mg total) by mouth 2 (two) times daily. 60 tablet 11  . bisoprolol (ZEBETA) 5 MG tablet Take 0.5 tablets (2.5 mg total) by mouth daily. 45 tablet 3  . calcium citrate-vitamin D (CITRACAL+D) 315-200 MG-UNIT tablet Take 1 tablet by mouth 2 (two) times daily.    . fluticasone (FLONASE) 50 MCG/ACT nasal spray Place 2 sprays into both nostrils daily. 16 g 0  . loperamide (IMODIUM) 2 MG capsule Take 1 capsule (2 mg total) by mouth every 8 (eight) hours as needed for diarrhea or loose stools. 15 capsule 0  . mometasone-formoterol (DULERA) 100-5 MCG/ACT AERO Inhale 2 puffs into the lungs in the morning and at bedtime. 1 each 0  . montelukast (SINGULAIR) 10 MG tablet Take 1 tablet (10 mg total) by mouth at bedtime. 30 tablet 5  .  Multiple Vitamins-Minerals (MULTIVITAMINS THER. W/MINERALS) TABS tablet Take 1 tablet by mouth daily.    Marland Kitchen omeprazole (PRILOSEC) 40 MG capsule Take 40 mg by mouth as needed.    . potassium chloride SA (KLOR-CON) 20 MEQ tablet Take 1 tablet (20 mEq total) by mouth 2 (two) times daily. Start on 09/28/2019 when you restart Lasix 60 tablet 3  . torsemide (DEMADEX) 20 MG tablet Take 3 tablets (60 mg total) by mouth 2 (two) times daily. 540 tablet 3  . vitamin B-12 (CYANOCOBALAMIN) 500 MCG tablet Take 500 mcg by mouth daily.     No current facility-administered medications for this visit.   Allergies:  Feraheme [ferumoxytol], Cardizem cd [diltiazem hcl er beads], Amiodarone, Metoprolol tartrate, Sulfa antibiotics, Tomato, Carvedilol, and Chocolate   Social History: The patient  reports that she quit smoking about 36 years ago. Her smoking use included cigarettes. She has a 28.00 pack-year smoking history. She has never used smokeless tobacco. She reports that she does not drink alcohol and does not use drugs.   Family History: The patient's  family history includes Aneurysm in her father; Cancer in her mother, sister, and sister.   ROS:  Please see the history of present illness. Otherwise, complete review of systems is positive for none.  All other systems are reviewed and negative.   Physical Exam: VS:  BP 130/68   Pulse 80   Ht 5\' 4"  (1.626 m)   Wt 123 lb (55.8 kg)   SpO2 97%   BMI 21.11 kg/m , BMI Body mass index is 21.11 kg/m.  Wt Readings from Last 3 Encounters:  03/31/20 123 lb (55.8 kg)  03/02/20 120 lb (54.4 kg)  02/26/20 124 lb 6.4 oz (56.4 kg)    General: Patient appears comfortable at rest. Neck: Supple, no elevated JVP or carotid bruits, no thyromegaly. Lungs: Clear to auscultation, nonlabored breathing at rest. Cardiac: Irregularly irregular rate and rhythm, no S3 or significant systolic murmur, no pericardial rub. Extremities: No edema, distal pulses 2+. Skin: Warm and dry. Musculoskeletal: No kyphosis. Neuropsychiatric: Alert and oriented x3, affect grossly appropriate.  ECG:  EKG on 12/04/2019 showed atrial fibrillation with a rate of 124, LAFB, anterior infarct old.  Recent Labwork: 11/04/2019: B Natriuretic Peptide 1,511.3; TSH 1.887 11/26/2019: ALT 12; AST 26 02/26/2020: BUN 26; Creatinine, Ser 2.04; Potassium 3.6; Sodium 138 03/29/2020: Hemoglobin 12.5; Magnesium 2.3; Platelets 198     Component Value Date/Time   CHOL 109 09/24/2016 0438   TRIG 40 10/29/2019 1726   HDL 34 (L) 09/24/2016 0438   CHOLHDL 3.2 09/24/2016 0438   VLDL 13 09/24/2016 0438   LDLCALC 62 09/24/2016 0438    Other Studies Reviewed Today:  Echocardiogram 01/06/2020 1. Left ventricular ejection fraction, by estimation, is 50 to 55%. The left ventricle has low normal function. The left ventricle demonstrates regional wall motion abnormalities with mild septal hypokinesis. Left ventricular diastolic parameters are indeterminate. 2. Right ventricular systolic function is mildly reduced. The right ventricular size is  mildly enlarged. There is mildly elevated pulmonary artery systolic pressure. The estimated right ventricular systolic pressure is 16.1 mmHg. 3. The mitral valve is degenerative. Moderate mitral valve regurgitation, 2 eccentric jets. No evidence of mitral stenosis. 4. The aortic valve is tricuspid. Aortic valve regurgitation is not visualized. Mild aortic valve sclerosis is present, with no evidence of aortic valve stenosis. 5. Left atrial size was moderately dilated. 6. Right atrial size was severely dilated. 7. Tricuspid valve regurgitation is moderate to severe. 8. Moderate pericardial  effusion, primarily lateral to heart. 9. The inferior vena cava is dilated in size with >50% respiratory variability, suggesting right atrial pressure of 8 mmHg.    Right heart cath 11/09/2019 Findings:  RA = 18 RV = 34/14 PA = 35/18 (25) PCW = 16 Fick cardiac output/index = 5.8/3.6 PVR = 1.5 WU FA sat = 99% PA sat = 66%, 67% High SVC = 66% PAPi = 0.9  Assessment: 1. Predominant RV failure 2. Mild PAH 3. Normal left-sided pressures 4. Normal cardiac output  Plan/Discussion:  Hemodynamics suggest advanced RV failure with PAPi < 1.0 however CO is normal. Etiology of RV failure unclear. Primary RV issue versus burned out PAH. If it was the laltter would expect CO to be down. Consider cMRI if she can tolerate. Diurese slowly.   Echocardiogram 11/04/2019 1. Left ventricular ejection fraction, by estimation, is 50%. The left ventricle has mildly decreased function. The left ventricle demonstrates regional wall motion abnormalities with septal hypokinesis. Left ventricular diastolic parameters are indeterminate due to atrial fibrillation. 2. Right ventricular systolic function is mildly reduced. The right ventricular size is severely enlarged. D-shaped interventricular septum suggests RV pressure/volume overload. 3. The mitral valve is normal in structure. Mild to moderate mitral valve  regurgitation. No evidence of mitral stenosis. 4. Incomplete coaptation of the tricuspid valve. Tricuspid valve regurgitation is severe. Estimated PA systolic pressure 32 mmHg. 5. The aortic valve is tricuspid. Aortic valve regurgitation is not visualized. Mild to moderate aortic valve sclerosis/calcification is present, without any evidence of aortic stenosis. 6. Right atrial size was severely dilated. 7. The inferior vena cava is dilated in size with <50% respiratory variability, suggesting right atrial pressure of 15 mmHg. 8. Moderate to large primarily posterior and inferior pericardial effusion. I do not think tamponade is present. The IVC is dilated but there is no RV diastolic collapse and there is <25% respirophasic variation of mitral E inflow doppler signal.   Echocardiogram 09/18/2019  1. Left ventricular ejection fraction, by estimation, is 35 to 40%. The left ventricle has moderately decreased function. The left ventricle demonstrates global hypokinesis. Left ventricular diastolic parameters are indeterminate. 2. Right ventricular systolic function is mildly reduced. The right ventricular size is mildly enlarged. There is normal pulmonary artery systolic pressure. The estimated right ventricular systolic pressure is 59.9 mmHg. 3. Left atrial size was moderately dilated. 4. Right atrial size was severely dilated. 5. Large pericardial effusion. The pericardial effusion is posterior to the left ventricle. No obvious RV compromise to suggest tamponade and there are signs of chronicity. Small anterior effusion. 6. The mitral valve is grossly normal. Moderate mitral valve regurgitation. 7. Tricuspid valve regurgitation is moderate. 8. The aortic valve is tricuspid. Aortic valve regurgitation is not visualized. Mild to moderate aortic valve sclerosis/calcification is present, without any evidence of aortic stenosis. Moderate nodular calcification of the noncoronary cusp. 9. The  inferior vena cava is dilated in size with <50% respiratory variability, suggesting right atrial pressure of 15 mmHg.   Echocardiogram: 09/2016 Study Conclusions   - Left ventricle: The cavity size was normal. Wall thickness was  normal. Systolic function was normal. The estimated ejection  fraction was in the range of 55% to 60%. Wall motion was normal;  there were no regional wall motion abnormalities.  - Aortic valve: Mildly to moderately calcified annulus. Mildly  thickened leaflets. Valve area (VTI): 2.92 cm^2. Valve area  (Vmax): 2.73 cm^2. Valve area (Vmean): 2.61 cm^2.  - Mitral valve: There was moderate regurgitation.  - Left  atrium: The atrium was severely dilated.  - Right atrium: The atrium was severely dilated.  - Atrial septum: No defect or patent foramen ovale was identified.  - Pulmonary arteries: Systolic pressure was moderately increased.  PA peak pressure: 49 mm Hg (S).  - Pericardium, extracardiac: There is a small circumferential  pericardial effusion.  - Technically adequate study.   Event Monitor: 11/2017 1. Atrial fib with a controlled VR and a RVR 2. NSVT 3. No prolonged pauses or bradycardia  Gregg Taylor,M.D.  Assessment and Plan:   1. DOE (dyspnea on exertion) Recent hospitalization [facility] for heart failure.  Left heart cath and repeat echocardiogram were performed.  See results above.  Currently denies any dyspnea on exertion.  She had PFTs ordered on 11/10/2019.  Today she denies any dyspnea at rest or with exertion.  She recently had PFTs for pulmonary and has a follow-up upcoming later on in January with Dr. Halford Chessman pulmonology.  2.  HFrEF. Recent admission for acute on chronic combined systolic and diastolic heart failure with predominant right heart failure.  Had right heart cath and repeat echocardiogram.  See results above.  She denies any recent weight gain, dyspnea at rest or on exertion or lower extremity edema.  Her weight  is remaining stable.  Continue bisoprolol 2.5 mg daily.  Continue torsemide 60 mg p.o. twice daily.   3. Persistent atrial fibrillation (HCC) Currently on bisoprolol 2.5 mg daily.  Rate is controlled today with heart rate of 82   Continue bisoprolol 2.5 mg daily.  Eliquis was DC'd due to history of anemia, chronic renal failure, and refusal of blood products secondary to being a Jehovah's Witness  4. History of anemia due to chronic kidney disease Recent colonoscopy and EGD for anemia.  Previous hemoglobin and hematocrit were 8.5 and 27.3 respectively.  Likely anemia from chronic renal disease. Has been seeing hematology [name] and has received 2 recent iron infusions.  Recent H&H on 12/24/2019: 11.7 and 37.9.  Most recent hemoglobin on [facility] test 03/29/2020 was 12.5.  Continuing to see [name]  5.  Stage IV renal failure. Previous creatinine 2.44, GFR 18 on 11/26/2019.  Patient has recently seen Dr. Theador Hawthorne and is following with him.  Creatinine 2.04 and GFR 24 on most recent lab work 02/26/2020.  Medication Adjustments/Labs and Tests Ordered: Current medicines are reviewed at length with the patient today.  Concerns regarding medicines are outlined above.   Disposition: Follow-up with Dr. Harl Bowie or APP 6 months  Signed, Levell July, NP 03/31/2020 2:01 PM    [facility] [place], [place], [place] [address] Phone: [phone]; Fax: [phone]

## 2020-03-31 ENCOUNTER — Encounter: Payer: Self-pay | Admitting: Family Medicine

## 2020-03-31 ENCOUNTER — Ambulatory Visit: Payer: Medicare Other | Admitting: Family Medicine

## 2020-03-31 ENCOUNTER — Other Ambulatory Visit: Payer: Self-pay

## 2020-03-31 ENCOUNTER — Ambulatory Visit: Payer: Medicare Other | Admitting: Cardiology

## 2020-03-31 VITALS — BP 130/68 | HR 80 | Ht 64.0 in | Wt 123.0 lb

## 2020-03-31 DIAGNOSIS — Z862 Personal history of diseases of the blood and blood-forming organs and certain disorders involving the immune mechanism: Secondary | ICD-10-CM | POA: Diagnosis not present

## 2020-03-31 DIAGNOSIS — N189 Chronic kidney disease, unspecified: Secondary | ICD-10-CM | POA: Diagnosis not present

## 2020-03-31 DIAGNOSIS — R0600 Dyspnea, unspecified: Secondary | ICD-10-CM | POA: Diagnosis not present

## 2020-03-31 DIAGNOSIS — N184 Chronic kidney disease, stage 4 (severe): Secondary | ICD-10-CM

## 2020-03-31 DIAGNOSIS — I5022 Chronic systolic (congestive) heart failure: Secondary | ICD-10-CM

## 2020-03-31 DIAGNOSIS — I4819 Other persistent atrial fibrillation: Secondary | ICD-10-CM | POA: Diagnosis not present

## 2020-03-31 DIAGNOSIS — R0609 Other forms of dyspnea: Secondary | ICD-10-CM

## 2020-03-31 NOTE — Patient Instructions (Signed)
Medication Instructions:  Your physician recommends that you continue on your current medications as directed. Please refer to the Current Medication list given to you today.  *If you need a refill on your cardiac medications before your next appointment, please call your pharmacy*   Lab Work: NONE   If you have labs (blood work) drawn today and your tests are completely normal, you will receive your results only by: Marland Kitchen MyChart Message (if you have MyChart) OR . A paper copy in the mail If you have any lab test that is abnormal or we need to change your treatment, we will call you to review the results.   Testing/Procedures: NONE    Follow-Up: At [facility], you and your health needs are our priority.  As part of our continuing mission to provide you with exceptional heart care, we have created designated Provider Care Teams.  These Care Teams include your primary Cardiologist (physician) and Advanced Practice Providers (APPs -  Physician Assistants and Nurse Practitioners) who all work together to provide you with the care you need, when you need it.  We recommend signing up for the patient portal called "MyChart".  Sign up information is provided on this After Visit Summary.  MyChart is used to connect with patients for Virtual Visits (Telemedicine).  Patients are able to view lab/test results, encounter notes, upcoming appointments, etc.  Non-urgent messages can be sent to your provider as well.   To learn more about what you can do with MyChart, go to [email or web address].    Your next appointment:   6 month(s)  The format for your next appointment:   In Person  Provider:   You may see [name], MD or the following Advanced Practice Provider on your designated Care Team:    Katina Dung, NP    Other Instructions Thank you for choosing [facility]!

## 2020-04-01 ENCOUNTER — Ambulatory Visit: Payer: Medicare Other | Admitting: Pulmonary Disease

## 2020-04-01 DIAGNOSIS — D638 Anemia in other chronic diseases classified elsewhere: Secondary | ICD-10-CM | POA: Diagnosis not present

## 2020-04-01 DIAGNOSIS — I129 Hypertensive chronic kidney disease with stage 1 through stage 4 chronic kidney disease, or unspecified chronic kidney disease: Secondary | ICD-10-CM | POA: Diagnosis not present

## 2020-04-01 DIAGNOSIS — E211 Secondary hyperparathyroidism, not elsewhere classified: Secondary | ICD-10-CM | POA: Diagnosis not present

## 2020-04-01 DIAGNOSIS — I5022 Chronic systolic (congestive) heart failure: Secondary | ICD-10-CM | POA: Diagnosis not present

## 2020-04-01 DIAGNOSIS — N184 Chronic kidney disease, stage 4 (severe): Secondary | ICD-10-CM | POA: Diagnosis not present

## 2020-04-06 ENCOUNTER — Inpatient Hospital Stay (HOSPITAL_COMMUNITY): Payer: Medicare Other | Attending: Hematology | Admitting: Hematology

## 2020-04-06 ENCOUNTER — Other Ambulatory Visit: Payer: Self-pay

## 2020-04-06 VITALS — BP 131/67 | HR 85 | Temp 97.1°F | Resp 17 | Wt 123.0 lb

## 2020-04-06 DIAGNOSIS — Z803 Family history of malignant neoplasm of breast: Secondary | ICD-10-CM | POA: Diagnosis not present

## 2020-04-06 DIAGNOSIS — K219 Gastro-esophageal reflux disease without esophagitis: Secondary | ICD-10-CM | POA: Diagnosis not present

## 2020-04-06 DIAGNOSIS — N189 Chronic kidney disease, unspecified: Secondary | ICD-10-CM | POA: Diagnosis not present

## 2020-04-06 DIAGNOSIS — J45909 Unspecified asthma, uncomplicated: Secondary | ICD-10-CM | POA: Diagnosis not present

## 2020-04-06 DIAGNOSIS — F419 Anxiety disorder, unspecified: Secondary | ICD-10-CM | POA: Diagnosis not present

## 2020-04-06 DIAGNOSIS — Z87891 Personal history of nicotine dependence: Secondary | ICD-10-CM | POA: Insufficient documentation

## 2020-04-06 DIAGNOSIS — I509 Heart failure, unspecified: Secondary | ICD-10-CM | POA: Diagnosis not present

## 2020-04-06 DIAGNOSIS — Z853 Personal history of malignant neoplasm of breast: Secondary | ICD-10-CM | POA: Insufficient documentation

## 2020-04-06 DIAGNOSIS — I129 Hypertensive chronic kidney disease with stage 1 through stage 4 chronic kidney disease, or unspecified chronic kidney disease: Secondary | ICD-10-CM | POA: Insufficient documentation

## 2020-04-06 DIAGNOSIS — E538 Deficiency of other specified B group vitamins: Secondary | ICD-10-CM | POA: Insufficient documentation

## 2020-04-06 DIAGNOSIS — D649 Anemia, unspecified: Secondary | ICD-10-CM | POA: Diagnosis not present

## 2020-04-06 DIAGNOSIS — Z79899 Other long term (current) drug therapy: Secondary | ICD-10-CM | POA: Insufficient documentation

## 2020-04-06 DIAGNOSIS — Z7901 Long term (current) use of anticoagulants: Secondary | ICD-10-CM | POA: Insufficient documentation

## 2020-04-06 NOTE — Progress Notes (Signed)
[place], [place] [address]   CLINIC:  Medical Oncology/Hematology  PCP:  [name], MD [address] [place] [address]  [phone]  REASON FOR VISIT:  Follow-up for normocytic anemia in a Jehovah witness  PRIOR THERAPY: None  CURRENT THERAPY: Intermittent Feraheme last on 12/04/2019  INTERVAL HISTORY:  Bailey Bishop, a 80 y.o. female, returns for routine follow-up for her normocytic anemia in a Jehovah witness. Josalyn was last seen on 12/24/2019.  Today she reports feeling well. Her energy levels are okay, though she will get exhausted if she exerts herself to much. When she received her previous Feraheme, her energy levels increased overall. She started taking Eliquis 2.5 mg BID, vitamin B12 and iron supplement daily. She denies being constipated and denies nosebleeds, hematuria, hematochezia or leg swelling.   REVIEW OF SYSTEMS:  Review of Systems  Constitutional: Positive for fatigue (75%). Negative for appetite change.  HENT:   Negative for nosebleeds.   Cardiovascular: Negative for leg swelling.  Gastrointestinal: Negative for blood in stool and constipation.  Genitourinary: Negative for hematuria.   All other systems reviewed and are negative.   PAST MEDICAL/SURGICAL HISTORY:  Past Medical History:  Diagnosis Date  . Anemia   . Anxiety   . Arthritis   . Asthma   . Atrial fibrillation ([place])    a. s/p unsuccessful DCCV in 01/2017 and intolerant to Amiodarone --> Rate-control strategy pursued since b. failed Tikosyn in 11/2017  . Breast cancer ([place]) 2001   Right  . CHF (congestive heart failure) ([place])   . Chronic kidney disease   . Essential hypertension   . GERD (gastroesophageal reflux disease)   . T10 vertebral fracture [name])    Past Surgical History:  Procedure Laterality Date  . BIOPSY N/A 04/22/2018   Procedure: BIOPSY;  Surgeon: Aviva Signs, MD;  [facility];  Service:  Gastroenterology;  Laterality: N/A;  . BIOPSY  09/06/2019   Procedure: BIOPSY;  Surgeon: Daneil Dolin, MD;  [facility];  Service: Endoscopy;;  gastric  . CARDIOVERSION N/A 02/18/2017   Procedure: CARDIOVERSION;  Surgeon: Josue Hector, MD;  Location: [facility] ENDOSCOPY;  Service: Cardiovascular;  Laterality: N/A;  . CARDIOVERSION N/A 12/04/2017   Procedure: CARDIOVERSION;  Surgeon: [name], MD;  Location: [place];  Service: Cardiovascular;  Laterality: N/A;  . CATARACT EXTRACTION W/PHACO  07/23/2011   Procedure: CATARACT EXTRACTION PHACO AND INTRAOCULAR LENS PLACEMENT ([place]);  Surgeon: Williams Che, MD;  Location: [facility];  Service: Ophthalmology;  Laterality: Right;  CDE:9.96  . CATARACT EXTRACTION W/PHACO Left 11/13/2018   Procedure: CATARACT EXTRACTION PHACO AND INTRAOCULAR LENS PLACEMENT (IOC);  Surgeon: Baruch Goldmann, MD;  Location: [facility];  Service: Ophthalmology;  Laterality: Left;  left, CDE: 7.44  . CHOLECYSTECTOMY N/A 02/14/2015   Procedure: LAPAROSCOPIC CHOLECYSTECTOMY;  Surgeon: Aviva Signs, MD;  Location: [facility];  Service: General;  Laterality: N/A;  . COLONOSCOPY N/A 04/22/2018   Procedure: COLONOSCOPY;  Surgeon: Aviva Signs, MD;  [facility];  Service: Gastroenterology;  Laterality: N/A;  . COLONOSCOPY WITH PROPOFOL N/A 09/06/2019   Procedure: COLONOSCOPY WITH PROPOFOL;  Surgeon: Daneil Dolin, MD;  [facility];  Service: Endoscopy;  Laterality: N/A;  . ESOPHAGEAL DILATION  09/06/2019   Procedure: ESOPHAGEAL DILATION;  Surgeon: Daneil Dolin, MD;  [facility];  Service: Endoscopy;;  . ESOPHAGOGASTRODUODENOSCOPY N/A 04/22/2018   Procedure: ESOPHAGOGASTRODUODENOSCOPY (EGD);  Surgeon: Aviva Signs, MD;  [facility];  Service: Gastroenterology;  Laterality: N/A;  . ESOPHAGOGASTRODUODENOSCOPY (EGD) WITH PROPOFOL N/A 09/06/2019   Procedure: ESOPHAGOGASTRODUODENOSCOPY (EGD) WITH PROPOFOL;  Surgeon: Daneil Dolin, MD;  [facility];  Service: Endoscopy;  Laterality: N/A;  . GIVENS CAPSULE STUDY N/A 10/31/2019   Procedure: GIVENS CAPSULE STUDY;  Surgeon: Harvel Quale, MD;  [facility];  Service: Gastroenterology;  Laterality: N/A;  . MASTECTOMY     bilateral mastectomy-right breast cancer-left taken by choice  . POLYPECTOMY  04/22/2018   Procedure: POLYPECTOMY;  Surgeon: Aviva Signs, MD;  [facility];  Service: Gastroenterology;;  . POLYPECTOMY  09/06/2019   Procedure: POLYPECTOMY;  Surgeon: Daneil Dolin, MD;  [facility];  Service: Endoscopy;;  cecal, ascending, hepatic flexure, sigmoid  . RIGHT HEART CATH N/A 11/09/2019   Procedure: RIGHT HEART CATH;  Surgeon: Jolaine Artist, MD;  Location: [facility];  Service: Cardiovascular;  Laterality: N/A;  . SEPTOPLASTY  1995    SOCIAL HISTORY:  Social History   Socioeconomic History  . Marital status: Married    Spouse name: [name]  . Number of children: 1  . Years of education: Not on file  . Highest education level: Not on file  Occupational History  . Occupation: retired  Tobacco Use  . Smoking status: Former Smoker    Packs/day: 1.00    Years: 28.00    Pack years: 28.00    Types: Cigarettes    Quit date: 07/17/1983    Years since quitting: 36.7  . Smokeless tobacco: Never Used  Vaping Use  . Vaping Use: Never used  Substance and Sexual Activity  . Alcohol use: No  . Drug use: No  . Sexual activity: Not on file  Other Topics Concern  . Not on file  Social History Narrative  . Not on file   Social Determinants of Health   Financial Resource Strain: Not on file  Food Insecurity: No Food Insecurity  . Worried About Charity fundraiser in the Last Year: Never true  . Ran Out of Food in the Last Year: Never true  Transportation Needs: No Transportation Needs  . Lack of Transportation (Medical): No  . Lack of Transportation (Non-Medical): No  Physical  Activity: Not on file  Stress: Not on file  Social Connections: Moderately Integrated  . Frequency of Communication with Friends and Family: Once a week  . Frequency of Social Gatherings with Friends and Family: Once a week  . Attends Religious Services: 1 to 4 times per year  . Active Member of Clubs or Organizations: No  . Attends Archivist Meetings: 1 to 4 times per year  . Marital Status: Married  Human resources officer Violence: Not on file    FAMILY HISTORY:  Family History  Problem Relation Age of Onset  . Cancer Mother   . Aneurysm Father   . Cancer Sister   . Cancer Sister   . Anesthesia problems Neg Hx   . Hypotension Neg Hx   . Malignant hyperthermia Neg Hx   . Pseudochol deficiency Neg Hx   . Colon cancer Neg Hx     CURRENT MEDICATIONS:  Current Outpatient Medications  Medication Sig Dispense Refill  . ALPRAZolam (XANAX) 0.5 MG tablet Take 0.5 mg by mouth at bedtime.    Marland Kitchen apixaban (ELIQUIS) 2.5 MG TABS tablet Take 1 tablet (2.5 mg total) by mouth 2 (two) times daily.  60 tablet 11  . bisoprolol (ZEBETA) 5 MG tablet Take 0.5 tablets (2.5 mg total) by mouth daily. 45 tablet 3  . calcium citrate-vitamin D (CITRACAL+D) 315-200 MG-UNIT tablet Take 1 tablet by mouth 2 (two) times daily.    . fluticasone (FLONASE) 50 MCG/ACT nasal spray Place 2 sprays into both nostrils daily. 16 g 0  . mometasone-formoterol (DULERA) 100-5 MCG/ACT AERO Inhale 2 puffs into the lungs in the morning and at bedtime. 1 each 0  . montelukast (SINGULAIR) 10 MG tablet Take 1 tablet (10 mg total) by mouth at bedtime. 30 tablet 5  . Multiple Vitamins-Minerals (MULTIVITAMINS THER. W/MINERALS) TABS tablet Take 1 tablet by mouth daily.    Marland Kitchen omeprazole (PRILOSEC) 40 MG capsule Take 40 mg by mouth as needed.    . potassium chloride SA (KLOR-CON) 20 MEQ tablet Take 1 tablet (20 mEq total) by mouth 2 (two) times daily. Start on 09/28/2019 when you restart Lasix 60 tablet 3  . torsemide (DEMADEX) 20 MG  tablet Take 3 tablets (60 mg total) by mouth 2 (two) times daily. 540 tablet 3  . vitamin B-12 (CYANOCOBALAMIN) 500 MCG tablet Take 500 mcg by mouth daily.    Marland Kitchen acetaminophen (TYLENOL) 650 MG CR tablet Take 650 mg by mouth every 8 (eight) hours as needed for pain.  (Patient not taking: Reported on 04/06/2020)    . albuterol (VENTOLIN HFA) 108 (90 Base) MCG/ACT inhaler Inhale 1-2 puffs into the lungs every 6 (six) hours as needed for wheezing or shortness of breath. (Patient not taking: Reported on 04/06/2020) 18 g 3  . loperamide (IMODIUM) 2 MG capsule Take 1 capsule (2 mg total) by mouth every 8 (eight) hours as needed for diarrhea or loose stools. (Patient not taking: Reported on 04/06/2020) 15 capsule 0   No current facility-administered medications for this visit.    ALLERGIES:  Allergies  Allergen Reactions  . Feraheme [Ferumoxytol] Shortness Of Breath  . Cardizem Cd [Diltiazem Hcl Er Beads] Diarrhea  . Amiodarone Nausea And Vomiting  . Metoprolol Tartrate Diarrhea  . Sulfa Antibiotics Nausea And Vomiting  . Tomato Other (See Comments)    Burns stomach  . Carvedilol Diarrhea and Rash  . Chocolate Rash and Other (See Comments)    Dark chocolate    PHYSICAL EXAM:  Performance status (ECOG): 1 - Symptomatic but completely ambulatory  Vitals:   04/06/20 1424  BP: 131/67  Pulse: 85  Resp: 17  Temp: (!) 97.1 F (36.2 C)  SpO2: 99%   Wt Readings from Last 3 Encounters:  04/06/20 123 lb (55.8 kg)  03/31/20 123 lb (55.8 kg)  03/02/20 120 lb (54.4 kg)   Physical Exam Vitals reviewed.  Constitutional:      Appearance: Normal appearance.  Cardiovascular:     Rate and Rhythm: Normal rate and regular rhythm.     Pulses: Normal pulses.     Heart sounds: Normal heart sounds.  Pulmonary:     Effort: Pulmonary effort is normal.     Breath sounds: Normal breath sounds.  Neurological:     General: No focal deficit present.     Mental Status: She is alert and oriented to person,  place, and time.  Psychiatric:        Mood and Affect: Mood normal.        Behavior: Behavior normal.     LABORATORY DATA:  I have reviewed the labs as listed.  CBC Latest Ref Rng & Units 03/29/2020 03/29/2020 03/14/2020  WBC 4.0 -  10.5 K/uL - 6.7 -  Hemoglobin 12.0 - 15.0 g/dL 12.5 12.7 12.0  Hematocrit 36.0 - 46.0 % - 39.6 -  Platelets 150 - 400 K/uL - 198 -   CMP Latest Ref Rng & Units 02/26/2020 01/18/2020 01/06/2020  Glucose 70 - 99 mg/dL 104(H) 98 108(H)  BUN 8 - 23 mg/dL 26(H) 27(H) 23  Creatinine 0.44 - 1.00 mg/dL 2.04(H) 1.97(H) 2.21(H)  Sodium 135 - 145 mmol/L 138 138 138  Potassium 3.5 - 5.1 mmol/L 3.6 3.5 3.8  Chloride 98 - 111 mmol/L 96(L) 98 98  CO2 22 - 32 mmol/L 29 30 28   Calcium 8.9 - 10.3 mg/dL 9.2 9.7 9.8  Total Protein 6.5 - 8.1 g/dL - - -  Total Bilirubin 0.3 - 1.2 mg/dL - - -  Alkaline Phos 38 - 126 U/L - - -  AST 15 - 41 U/L - - -  ALT 0 - 44 U/L - - -      Component Value Date/Time   RBC 3.93 03/29/2020 1115   MCV 100.8 (H) 03/29/2020 1115   MCH 32.3 03/29/2020 1115   MCHC 32.1 03/29/2020 1115   RDW 12.6 03/29/2020 1115   LYMPHSABS 2.0 03/29/2020 1115   MONOABS 0.6 03/29/2020 1115   EOSABS 0.6 (H) 03/29/2020 1115   BASOSABS 0.1 03/29/2020 1115   Lab Results  Component Value Date   TIBC 359 03/29/2020   TIBC 355 01/18/2020   TIBC 353 11/26/2019   FERRITIN 146 03/29/2020   FERRITIN 47 11/26/2019   FERRITIN 311 (H) 11/04/2019   IRONPCTSAT 31 03/29/2020   IRONPCTSAT 26 01/18/2020   IRONPCTSAT 19 11/26/2019    DIAGNOSTIC IMAGING:  I have independently reviewed the scans and discussed with the patient. No results found.   ASSESSMENT:  1. Normocytic anemia: -Recent admission to the hospital with anemia. On 10/29/2019, ferritin was 9. -Received Feraheme on 10/31/2019. 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 Complains of fatigue. Denies any bleeding per rectum or melena. -Eliquis discontinued 3 weeks ago. -Patient started taking iron tablet daily last  week. -Colonoscopy on 09/06/2019 shows diverticulosis in the sigmoid and descending colon.Eight4-8 mm polyps in the sigmoid colon, descending colon at hepatic flexure and in the cecum. 13 mm polyp in the descending colon. -EGD on 09/06/2019 showed mild Schatzki ring, dilated. There is a gastropathy with no stigmata of bleeding. Normal duodenum. -SPEP negative, methylmalonic acid elevated.  LDH normal. -Received Feraheme on 12/04/2019 with reaction.  2. Right breast cancer: -Reportedly diagnosed 21 years ago and underwent bilateral mastectomy followed by 6 months of chemotherapy.  3. Social/family history: -She worked in Charity fundraiser. Quit smoking at age 25. Smoked half pack to 1 pack/day for 25 years. -1 sister died of metastatic breast cancer. Another sister had breast cancer.   PLAN:  1. Normocytic anemia: -Last Feraheme infusion on 12/04/2019. - She reported improved energy levels since last iron infusion. - Reviewed labs from 03/29/2020.  Hemoglobin is 12.7 and ferritin is 146.  B12 is normal. - RTC 3 months for follow-up with repeat labs.  2. CKD: -Continue follow-up with Dr. Theador Hawthorne.  3. Atrial fibrillation: -She started back on Eliquis 2.5 mg twice daily.  4. Family history: -Given her personal and family history, I have recommended genetic testing.  But she refused.  5.  Vitamin B12 deficiency: -Continue B12 1 mg tablet daily.  B12 is normal.  Orders placed this encounter:  No orders of the defined types were placed in this encounter.    Derek Jack, MD  [place] [phone]   I, Milinda Antis, am acting as a scribe for Dr. Sanda Linger.  I, Derek Jack MD, have reviewed the above documentation for accuracy and completeness, and I agree with the above.

## 2020-04-06 NOTE — Patient Instructions (Signed)
[place] at [facility] Discharge Instructions  You were seen today by [name]. He went over your recent results. You will be referred to genetics for further genetic analysis. [name] will see you back in 3 months for labs and follow up.   Thank you for choosing [place] at [facility] to provide your oncology and hematology care.  To afford each patient quality time with our provider, please arrive at least 15 minutes before your scheduled appointment time.   If you have a lab appointment with the [facility] please come in thru the Main Entrance and check in at the main information desk  You need to re-schedule your appointment should you arrive 10 or more minutes late.  We strive to give you quality time with our providers, and arriving late affects you and other patients whose appointments are after yours.  Also, if you no show three or more times for appointments you may be dismissed from the clinic at the providers discretion.     Again, thank you for choosing [facility].  Our hope is that these requests will decrease the amount of time that you wait before being seen by our physicians.       _____________________________________________________________  Should you have questions after your visit to [facility], please contact our office at [phone] between the hours of 8:00 a.m. and 4:30 p.m.  Voicemails left after 4:00 p.m. will not be returned until the following business day.  For prescription refill requests, have your pharmacy contact our office and allow 72 hours.    Cancer Center Support Programs:   > Cancer Support Group  2nd Tuesday of the month 1pm-2pm, Journey Room

## 2020-04-12 ENCOUNTER — Other Ambulatory Visit: Payer: Self-pay

## 2020-04-12 ENCOUNTER — Encounter (HOSPITAL_COMMUNITY)
Admission: RE | Admit: 2020-04-12 | Discharge: 2020-04-12 | Disposition: A | Discharge status: Home / Self Care | Payer: Medicare Other | Source: Ambulatory Visit | Attending: Nephrology | Admitting: Nephrology

## 2020-04-12 ENCOUNTER — Encounter (HOSPITAL_COMMUNITY): Payer: Self-pay

## 2020-04-12 DIAGNOSIS — N184 Chronic kidney disease, stage 4 (severe): Secondary | ICD-10-CM | POA: Diagnosis not present

## 2020-04-12 DIAGNOSIS — D631 Anemia in chronic kidney disease: Secondary | ICD-10-CM | POA: Diagnosis not present

## 2020-04-12 LAB — POCT HEMOGLOBIN-HEMACUE: Hemoglobin: 12 g/dL (ref 12.0–15.0)

## 2020-04-12 MED ORDER — EPOETIN ALFA 3000 UNIT/ML IJ SOLN: 3000.0000 [IU] | Freq: Once | INTRAMUSCULAR | Status: DC

## 2020-04-25 ENCOUNTER — Ambulatory Visit: Payer: Medicare Other | Admitting: Pulmonary Disease

## 2020-04-26 ENCOUNTER — Ambulatory Visit: Payer: Medicare Other | Admitting: Pulmonary Disease

## 2020-04-26 ENCOUNTER — Encounter (HOSPITAL_COMMUNITY): Admission: RE | Admit: 2020-04-26 | Payer: Medicare Other | Source: Ambulatory Visit

## 2020-04-28 ENCOUNTER — Other Ambulatory Visit (HOSPITAL_COMMUNITY): Payer: Self-pay | Admitting: Cardiology

## 2020-05-03 ENCOUNTER — Other Ambulatory Visit: Payer: Self-pay

## 2020-05-03 ENCOUNTER — Ambulatory Visit (HOSPITAL_COMMUNITY)
Admission: RE | Admit: 2020-05-03 | Discharge: 2020-05-03 | Disposition: A | Discharge status: Home / Self Care | Payer: Medicare Other | Source: Ambulatory Visit | Attending: Cardiology | Admitting: Cardiology

## 2020-05-03 ENCOUNTER — Telehealth (HOSPITAL_COMMUNITY): Payer: Self-pay | Admitting: Pharmacy Technician

## 2020-05-03 ENCOUNTER — Encounter (HOSPITAL_COMMUNITY): Payer: Self-pay | Admitting: Cardiology

## 2020-05-03 VITALS — BP 118/70 | HR 94 | Wt 120.4 lb

## 2020-05-03 DIAGNOSIS — I4819 Other persistent atrial fibrillation: Secondary | ICD-10-CM

## 2020-05-03 DIAGNOSIS — I313 Pericardial effusion (noninflammatory): Secondary | ICD-10-CM | POA: Diagnosis not present

## 2020-05-03 DIAGNOSIS — I5032 Chronic diastolic (congestive) heart failure: Secondary | ICD-10-CM | POA: Diagnosis not present

## 2020-05-03 DIAGNOSIS — Z87891 Personal history of nicotine dependence: Secondary | ICD-10-CM | POA: Insufficient documentation

## 2020-05-03 DIAGNOSIS — N184 Chronic kidney disease, stage 4 (severe): Secondary | ICD-10-CM | POA: Diagnosis not present

## 2020-05-03 DIAGNOSIS — Z7901 Long term (current) use of anticoagulants: Secondary | ICD-10-CM | POA: Insufficient documentation

## 2020-05-03 DIAGNOSIS — I5022 Chronic systolic (congestive) heart failure: Secondary | ICD-10-CM | POA: Insufficient documentation

## 2020-05-03 DIAGNOSIS — I130 Hypertensive heart and chronic kidney disease with heart failure and stage 1 through stage 4 chronic kidney disease, or unspecified chronic kidney disease: Secondary | ICD-10-CM | POA: Diagnosis not present

## 2020-05-03 DIAGNOSIS — Z853 Personal history of malignant neoplasm of breast: Secondary | ICD-10-CM | POA: Insufficient documentation

## 2020-05-03 DIAGNOSIS — Z9013 Acquired absence of bilateral breasts and nipples: Secondary | ICD-10-CM | POA: Diagnosis not present

## 2020-05-03 DIAGNOSIS — I482 Chronic atrial fibrillation, unspecified: Secondary | ICD-10-CM | POA: Diagnosis not present

## 2020-05-03 DIAGNOSIS — Z7951 Long term (current) use of inhaled steroids: Secondary | ICD-10-CM | POA: Diagnosis not present

## 2020-05-03 DIAGNOSIS — D631 Anemia in chronic kidney disease: Secondary | ICD-10-CM | POA: Diagnosis not present

## 2020-05-03 DIAGNOSIS — Z79899 Other long term (current) drug therapy: Secondary | ICD-10-CM | POA: Insufficient documentation

## 2020-05-03 DIAGNOSIS — Z7984 Long term (current) use of oral hypoglycemic drugs: Secondary | ICD-10-CM | POA: Insufficient documentation

## 2020-05-03 LAB — BASIC METABOLIC PANEL
Anion gap: 12 (ref 5–15)
BUN: 27 mg/dL — ABNORMAL HIGH (ref 8–23)
CO2: 28 mmol/L (ref 22–32)
Calcium: 9.2 mg/dL (ref 8.9–10.3)
Chloride: 101 mmol/L (ref 98–111)
Creatinine, Ser: 1.79 mg/dL — ABNORMAL HIGH (ref 0.44–1.00)
GFR, Estimated: 28 mL/min — ABNORMAL LOW (ref >60)
Glucose, Bld: 100 mg/dL — ABNORMAL HIGH (ref 70–99)
Potassium: 3.7 mmol/L (ref 3.5–5.1)
Sodium: 141 mmol/L (ref 135–145)

## 2020-05-03 MED ORDER — TORSEMIDE 20 MG PO TABS: ORAL_TABLET | ORAL | 3 refills | Status: DC

## 2020-05-03 MED ORDER — EMPAGLIFLOZIN 10 MG PO TABS: 10.0000 mg | ORAL_TABLET | Freq: Every day | ORAL | 2 refills | Status: DC

## 2020-05-03 NOTE — Patient Instructions (Addendum)
Torsemide changed to 60 mg every  AM and 40 mg every PM  Start Jardiance 10 mg (1 tablet ) daily  Labs done today, your results will be available in MyChart, we will contact you for abnormal readings.  Please follow up lab work in 10 days in [place] recommends that you schedule a follow-up appointment in: 3 months  If you have any questions or concerns before your next appointment please send [place] a message through [place] or call our office at [phone].    TO LEAVE A MESSAGE FOR THE NURSE SELECT OPTION 2, PLEASE LEAVE A MESSAGE INCLUDING: . YOUR NAME . DATE OF BIRTH . CALL BACK NUMBER . REASON FOR CALL**this is important as we prioritize the call backs  [place] AS LONG AS YOU CALL BEFORE 4:00 PM  At the [facility], you and your health needs are our priority. As part of our continuing mission to provide you with exceptional heart care, we have created designated Provider Care Teams. These Care Teams include your primary Cardiologist (physician) and Advanced Practice Providers (APPs- Physician Assistants and Nurse Practitioners) who all work together to provide you with the care you need, when you need it.   You may see any of the following providers on your designated Care Team at your next follow up: Marland Kitchen Dr Glori Bickers . Dr [name] . Darrick Grinder, NP . Lyda Jester, [place] . Audry Riles, PharmD   Please be sure to bring in all your medications bottles to every appointment.

## 2020-05-03 NOTE — Progress Notes (Signed)
PCP: [name], MD HF Cardiology: Dr. Aundra Dubin  80 y.o. with history of CKD 4, chronic atrial fibrillation, anemia, and chronic systolic CHF was referred initially by [name] for evaluation of CHF.  Patient has a complex past history.  She has a long history of atrial fibrillation, now chronic as she has failed DCCV and Tikosyn and did not tolerate amiodarone.  Her last echo in 6/21 showed EF 35-40% with mild RV dysfunction, large pericardial effusion without tamponade, moderate MR, moderate TR.  Cause of cardiomyopathy is not certain.    She has been admitted multiple times this year to [facility]. She feels like hospitalizations have not helped her shortness of breath.  She was admitted to [facility] 10/29/19.  She was found to be anemic with hgb 6.4, FOBT+.  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  Hgb was 7.5 at time of discharge.  Eliquis was helped for several days, she was told to restart it on 11/04/19.    She was admitted again in 8/21 to [facility] with CHF exacerbation.  RHC in 8/21 showed elevated R>L filling pressures with mild pulmonary hypertension and low PVR. PAPi was low suggesting significant RV dysfunction.  Echo done today and reviewed showed EF 50-55% with septal hypokinesis, mild RV enlargement with mildly decreased RV systolic function, PASP 44 mmHg, moderate MR with 2 eccentric jets, moderate-severe TR, moderate lateral pericardial effusion (improved from prior echo).   PYP scan in 11/21 was grade 2 with H/CL 1.36. Negative myeloma panel and urine immunofixation. Invitae gene testing was negative for TTR mutations.   She returns today for followup of CHF.  She has been feeling good overall.  No lightheadedness.  No problems walking around her house and yard.   She will get short of breath if she has to walk a long distance in [employer].  No BRBPR/melena.  No orthopnea/PND.  Inhalers have been controlling her asthma.   Labs (8/21): K 3.9, creatinine 2.35, hgb 7.5,  FOBT+ Labs (9/21): K 3.7, creatinine 2.44 Labs (10/21): hgb 11.9, K 3.5, creatinine 1.97 Las (11/21): hgb 12.3 Labs (12/21): K 3.6, creatinine 2.04 Labs (1/22): hgb 12.5  PMH: 1. CKD stage 4 2. Chronic atrial fibrillation: Failed DCCV in 11/18.  - Failed Tikosyn in 9/19.  - Intolerant of calcium channel blockers.  - Intolerant of amiodarone.  3. HTN 4. Asthma 5. GERD 6. Chronic systolic => diastolic CHF: Uncertain etiology.  - Echo (6/21): EF 35-40%, global hypokinesis, mildly decreased RV systolic function with mild RV dilation, biatrial enlargement, large pericardial effusion without tamponade, moderate MR, moderate TR, IVC dilated.  - RHC (8/21): mean RA 18, PA 35/18, mean PCWP 16, CI 3.6, PVR 1.5 WU, PAPi 0.9 => primarily RV failure - V/Q scan (8/21): Not suggestive of chronic PEs - Echo (10/21): EF 50-55% with septal hypokinesis, mild RV enlargement with mildly decreased RV systolic function, PASP 44 mmHg, moderate MR with 2 eccentric jets, moderate-severe TR, moderate lateral pericardial effusion (improved from prior echo)  - PYP scan (11/21): grade 2 with H/CL 1.36.  Myeloma panel negative, urine immunofixation negative. Invitae gene testing was negative for TTR mutations.  7. Anemia of chronic renal disease.  8. Breast cancer: Bilateral mastectomy.  9. GI bleeding: Admission in 8/21. EGD/c-scope in 6/21 negative.   Social History   Socioeconomic History  . Marital status: Married    Spouse name: [name]  . Number of children: 1  . Years of education: Not  on file  . Highest education level: Not on file  Occupational History  . Occupation: retired  Tobacco Use  . Smoking status: Former Smoker    Packs/day: 1.00    Years: 28.00    Pack years: 28.00    Types: Cigarettes    Quit date: 07/17/1983    Years since quitting: 36.8  . Smokeless tobacco: Never Used  Vaping Use  . Vaping Use: Never used  Substance and Sexual Activity  . Alcohol use: No  . Drug use: No  .  Sexual activity: Not on file  Other Topics Concern  . Not on file  Social History Narrative  . Not on file   Social Determinants of Health   Financial Resource Strain: Not on file  Food Insecurity: No Food Insecurity  . Worried About Charity fundraiser in the Last Year: Never true  . Ran Out of Food in the Last Year: Never true  Transportation Needs: Not on file  Physical Activity: Not on file  Stress: Not on file  Social Connections: Moderately Integrated  . Frequency of Communication with Friends and Family: Once a week  . Frequency of Social Gatherings with Friends and Family: Once a week  . Attends Religious Services: 1 to 4 times per year  . Active Member of Clubs or Organizations: No  . Attends Archivist Meetings: 1 to 4 times per year  . Marital Status: Married  Human resources officer Violence: Not on file   Family History  Problem Relation Age of Onset  . Cancer Mother   . Aneurysm Father   . Cancer Sister   . Cancer Sister   . Anesthesia problems Neg Hx   . Hypotension Neg Hx   . Malignant hyperthermia Neg Hx   . Pseudochol deficiency Neg Hx   . Colon cancer Neg Hx    ROS: All systems reviewed and negative except as per HPI.  Current Outpatient Medications  Medication Sig Dispense Refill  . acetaminophen (TYLENOL) 650 MG CR tablet Take 650 mg by mouth every 8 (eight) hours as needed for pain.    Marland Kitchen albuterol (VENTOLIN HFA) 108 (90 Base) MCG/ACT inhaler Inhale 1-2 puffs into the lungs every 6 (six) hours as needed for wheezing or shortness of breath. 18 g 3  . ALPRAZolam (XANAX) 0.5 MG tablet Take 0.5 mg by mouth at bedtime.    Marland Kitchen apixaban (ELIQUIS) 2.5 MG TABS tablet Take 1 tablet (2.5 mg total) by mouth 2 (two) times daily. 60 tablet 11  . bisoprolol (ZEBETA) 5 MG tablet Take 0.5 tablets (2.5 mg total) by mouth daily. 45 tablet 3  . calcium citrate-vitamin D (CITRACAL+D) 315-200 MG-UNIT tablet Take 1 tablet by mouth 2 (two) times daily.    . empagliflozin  (JARDIANCE) 10 MG TABS tablet Take 1 tablet (10 mg total) by mouth daily. 30 tablet 2  . fluticasone (FLONASE) 50 MCG/ACT nasal spray Place 2 sprays into both nostrils daily. 16 g 0  . mometasone-formoterol (DULERA) 100-5 MCG/ACT AERO Inhale 2 puffs into the lungs in the morning and at bedtime. 1 each 0  . Multiple Vitamins-Minerals (MULTIVITAMINS THER. W/MINERALS) TABS tablet Take 1 tablet by mouth daily.    Marland Kitchen omeprazole (PRILOSEC) 40 MG capsule Take 40 mg by mouth as needed.    . potassium chloride SA (KLOR-CON) 20 MEQ tablet Take 1 tablet (20 mEq total) by mouth 2 (two) times daily. 90 tablet 0  . vitamin B-12 (CYANOCOBALAMIN) 500 MCG tablet Take 500 mcg  by mouth daily.    Marland Kitchen torsemide (DEMADEX) 20 MG tablet Take 3 tablets (60 mg total) by mouth in the morning AND 2 tablets (40 mg total) every evening. 450 tablet 3   No current facility-administered medications for this encounter.   BP 118/70   Pulse 94   Wt 54.6 kg (120 lb 6.4 oz)   SpO2 96%   BMI 20.67 kg/m  General: NAD Neck: No JVD, no thyromegaly or thyroid nodule.  Lungs: Occasional rhonchi CV: Nondisplaced PMI.  Heart regular S1/S2, no S3/S4, 2/6 HSM LLSB.  No peripheral edema.  No carotid bruit.  Normal pedal pulses.  Abdomen: Soft, nontender, no hepatosplenomegaly, no distention.  Skin: Intact without lesions or rashes.  Neurologic: Alert and oriented x 3.  Psych: Normal affect. Extremities: No clubbing or cyanosis.  HEENT: Normal.   Assessment/Plan: 1. Chronic systolic => diastolic CHF:  Prominent RV failure.  Echo in 6/21 with EF 35-40% with mild RV dysfunction, large pericardial effusion without tamponade, moderate MR, moderate TR.  RHC in 8/21 with preserved cardiac output, primarily RV failure, mild pulmonary hypertension with low PVR. V/Q scan in 8/21 not suggestive of chronic PEs.  Echo in 10/21 with EF 50-55% with septal hypokinesis, mild RV enlargement with mildly decreased RV systolic function, PASP 44 mmHg, moderate  MR with 2 eccentric jets, moderate-severe TR, moderate lateral pericardial effusion (improved from prior echo).  PYP scan was equivocal for TTR cardiac amyloidosis (grade 2 but H/CL < 1.5), Invitae gene testing was negative for TTR mutations.  Cause of cardiomyopathy is not certain. No chest pain.  She looks euvolemic.  NYHA class II symptoms.  - Decrease torsemide to 60 qam/40 qpm.   - Start empagliflozin with primarily diastolic CHF and CKD. BMET today and in 10 days.    - Continue KCl 20 bid.   - Continue bisoprolol 2.5 mg daily.  - No digoxin, Entresto, spironolactone with improved LV EF and CKD stage IV.  - With equivocal PYP scan, I will repeat PYP in 5/22.  - Repeat echo to follow pericardial effusion.  2. Atrial fibrillation: Chronic, has failed DCCV and Tikosyn.  She has not tolerated amiodarone.  Rate control good on bisoprolol 2.5 mg daily.  - Continue bisoprolol 2.5 daily.  - Continue Eliquis 2.5 mg bid.   3. Anemia: Multifactorial, suspect due to renal disease as well as GI bleeding.  Hgb down to 6.5 in 8/21 in setting of GI bleeding with FOBT+.  Hgb up to 12.5 in 1/22.    - No blood products (Jehovah's Witness).  - Following with hematology for Aranesp/feraheme.  4. CKD: Stage IV.  BMET today.  5. Pericardial effusion: Large on 6/21 echo without tamponade, echo in 10/21 showed pericardial effusion moderate laterally (smaller).  - Repeat echo as above.   Followup in 3 months.   [name] 05/03/2020

## 2020-05-03 NOTE — Telephone Encounter (Signed)
Patient was seen in clinic and started on Jardiance. She has some concerns over the possibility of having a UTI with this medication. States that she is already prone to getting them. With that being said, we are going to try the free 14 free trial of Jardiance. I am going to call her and see how she is doing on the medication. If she has no issues, we will submit an assistance application to [facility].  Will follow up.

## 2020-05-04 ENCOUNTER — Encounter (HOSPITAL_COMMUNITY): Payer: Self-pay

## 2020-05-04 ENCOUNTER — Encounter (HOSPITAL_COMMUNITY)
Admission: RE | Admit: 2020-05-04 | Discharge: 2020-05-04 | Disposition: A | Discharge status: Home / Self Care | Payer: Medicare Other | Source: Ambulatory Visit | Attending: Nephrology | Admitting: Nephrology

## 2020-05-04 ENCOUNTER — Other Ambulatory Visit: Payer: Self-pay

## 2020-05-04 DIAGNOSIS — D631 Anemia in chronic kidney disease: Secondary | ICD-10-CM | POA: Diagnosis not present

## 2020-05-04 DIAGNOSIS — N184 Chronic kidney disease, stage 4 (severe): Secondary | ICD-10-CM | POA: Diagnosis not present

## 2020-05-04 LAB — POCT HEMOGLOBIN-HEMACUE: Hemoglobin: 12.7 g/dL (ref 12.0–15.0)

## 2020-05-04 MED ORDER — EPOETIN ALFA 3000 UNIT/ML IJ SOLN: 3000.0000 [IU] | Freq: Once | INTRAMUSCULAR | Status: DC

## 2020-05-04 NOTE — Telephone Encounter (Signed)
Spoke with patient's husband today regarding her new medication Jardiance. Her husband and her have decided that she is not going to be taking this medication. She is very concerned about the possibility of a UTI.   Will route the message to the provider as well.   Charlann Boxer, CPhT

## 2020-05-05 ENCOUNTER — Telehealth (HOSPITAL_COMMUNITY): Payer: Self-pay | Admitting: *Deleted

## 2020-05-05 DIAGNOSIS — I313 Pericardial effusion (noninflammatory): Secondary | ICD-10-CM

## 2020-05-05 DIAGNOSIS — I3139 Other pericardial effusion (noninflammatory): Secondary | ICD-10-CM

## 2020-05-05 MED ORDER — TORSEMIDE 20 MG PO TABS: 60.0000 mg | ORAL_TABLET | Freq: Two times a day (BID) | ORAL | 3 refills | Status: DC

## 2020-05-05 NOTE — Telephone Encounter (Signed)
Order placed, will arrange

## 2020-05-05 NOTE — Telephone Encounter (Signed)
Go back to torsemide 60 mg bid likely she was taking before.

## 2020-05-05 NOTE — Telephone Encounter (Signed)
-----   Message from Larey Dresser, MD sent at 05/03/2020 11:45 PM EST ----- Please make sure she is set up to get an echocardiogram to followup on pericardial effusion.

## 2020-05-05 NOTE — Addendum Note (Signed)
Addended by: Harvie Junior on: 05/05/2020 03:20 PM   Modules accepted: Orders

## 2020-05-05 NOTE — Telephone Encounter (Signed)
Pt aware and verbalized understanding. Pt thanked Dr.McLean for listening to her concern about medication.

## 2020-05-11 ENCOUNTER — Ambulatory Visit (HOSPITAL_COMMUNITY)
Admission: RE | Admit: 2020-05-11 | Discharge: 2020-05-11 | Disposition: A | Discharge status: Home / Self Care | Payer: Medicare Other | Source: Ambulatory Visit | Attending: Cardiology | Admitting: Cardiology

## 2020-05-11 ENCOUNTER — Other Ambulatory Visit: Payer: Self-pay

## 2020-05-11 DIAGNOSIS — I313 Pericardial effusion (noninflammatory): Secondary | ICD-10-CM | POA: Diagnosis not present

## 2020-05-11 DIAGNOSIS — I3139 Other pericardial effusion (noninflammatory): Secondary | ICD-10-CM

## 2020-05-11 LAB — ECHOCARDIOGRAM COMPLETE
Area-P 1/2: 3.3 cm2
MV M vel: 5.11 m/s
MV Peak grad: 104.4 mmHg
Radius: 0.6 cm
S' Lateral: 3 cm

## 2020-05-11 NOTE — Progress Notes (Signed)
*  PRELIMINARY RESULTS* Echocardiogram 2D Echocardiogram has been performed.  Bailey Bishop 05/11/2020, 2:56 PM

## 2020-05-19 ENCOUNTER — Ambulatory Visit (HOSPITAL_COMMUNITY): Payer: Medicare Other

## 2020-05-30 ENCOUNTER — Encounter (HOSPITAL_COMMUNITY)
Admission: RE | Admit: 2020-05-30 | Discharge: 2020-05-30 | Disposition: A | Discharge status: Home / Self Care | Payer: Medicare Other | Source: Ambulatory Visit | Attending: Nephrology | Admitting: Nephrology

## 2020-05-30 ENCOUNTER — Other Ambulatory Visit: Payer: Self-pay

## 2020-05-30 ENCOUNTER — Encounter (HOSPITAL_COMMUNITY): Payer: Self-pay

## 2020-05-30 DIAGNOSIS — D631 Anemia in chronic kidney disease: Secondary | ICD-10-CM | POA: Insufficient documentation

## 2020-05-30 DIAGNOSIS — N184 Chronic kidney disease, stage 4 (severe): Secondary | ICD-10-CM | POA: Diagnosis not present

## 2020-05-30 LAB — POCT HEMOGLOBIN-HEMACUE: Hemoglobin: 12.5 g/dL (ref 12.0–15.0)

## 2020-05-30 MED ORDER — EPOETIN ALFA 3000 UNIT/ML IJ SOLN: 3000.0000 [IU] | Freq: Once | INTRAMUSCULAR | Status: DC

## 2020-06-02 DIAGNOSIS — N1832 Chronic kidney disease, stage 3b: Secondary | ICD-10-CM | POA: Diagnosis not present

## 2020-06-02 DIAGNOSIS — I5022 Chronic systolic (congestive) heart failure: Secondary | ICD-10-CM | POA: Diagnosis not present

## 2020-06-02 DIAGNOSIS — D638 Anemia in other chronic diseases classified elsewhere: Secondary | ICD-10-CM | POA: Diagnosis not present

## 2020-06-02 DIAGNOSIS — I129 Hypertensive chronic kidney disease with stage 1 through stage 4 chronic kidney disease, or unspecified chronic kidney disease: Secondary | ICD-10-CM | POA: Diagnosis not present

## 2020-06-21 NOTE — Research (Signed)
  Avoiding Treatment in the Hospital with Furoscix for the Management of Congestion in Heart Failure - A Pilot Study  AT [facility] Pilot   Visit Day: Day 0    Subject Number: 16-001    Date: August 9th, 2021    Respiration Rate:  (bpm) 20 ReDS:  (%) 31

## 2020-06-27 ENCOUNTER — Other Ambulatory Visit: Payer: Self-pay

## 2020-06-27 ENCOUNTER — Encounter (HOSPITAL_COMMUNITY): Payer: Self-pay

## 2020-06-27 ENCOUNTER — Encounter (HOSPITAL_COMMUNITY)
Admission: RE | Admit: 2020-06-27 | Discharge: 2020-06-27 | Disposition: A | Discharge status: Home / Self Care | Payer: Medicare Other | Source: Ambulatory Visit | Attending: Nephrology | Admitting: Nephrology

## 2020-06-27 DIAGNOSIS — N184 Chronic kidney disease, stage 4 (severe): Secondary | ICD-10-CM | POA: Diagnosis not present

## 2020-06-27 MED ORDER — EPOETIN ALFA 3000 UNIT/ML IJ SOLN: 3000.0000 [IU] | Freq: Once | INTRAMUSCULAR | Status: DC

## 2020-07-07 LAB — POCT HEMOGLOBIN-HEMACUE: Hemoglobin: 12.6 g/dL (ref 12.0–15.0)

## 2020-07-26 ENCOUNTER — Encounter (HOSPITAL_COMMUNITY): Payer: Self-pay

## 2020-07-26 ENCOUNTER — Encounter (HOSPITAL_COMMUNITY)
Admission: RE | Admit: 2020-07-26 | Discharge: 2020-07-26 | Disposition: A | Discharge status: Home / Self Care | Payer: Medicare Other | Source: Ambulatory Visit | Attending: Nephrology | Admitting: Nephrology

## 2020-07-26 ENCOUNTER — Other Ambulatory Visit: Payer: Self-pay

## 2020-07-26 DIAGNOSIS — N184 Chronic kidney disease, stage 4 (severe): Secondary | ICD-10-CM | POA: Insufficient documentation

## 2020-07-26 DIAGNOSIS — D631 Anemia in chronic kidney disease: Secondary | ICD-10-CM | POA: Diagnosis not present

## 2020-07-26 LAB — RENAL FUNCTION PANEL
Albumin: 4 g/dL (ref 3.5–5.0)
Anion gap: 4 — ABNORMAL LOW (ref 5–15)
BUN: 28 mg/dL — ABNORMAL HIGH (ref 8–23)
CO2: 33 mmol/L — ABNORMAL HIGH (ref 22–32)
Calcium: 8.9 mg/dL (ref 8.9–10.3)
Chloride: 101 mmol/L (ref 98–111)
Creatinine, Ser: 1.9 mg/dL — ABNORMAL HIGH (ref 0.44–1.00)
GFR, Estimated: 26 mL/min — ABNORMAL LOW (ref >60)
Glucose, Bld: 93 mg/dL (ref 70–99)
Phosphorus: 3.8 mg/dL (ref 2.5–4.6)
Potassium: 4 mmol/L (ref 3.5–5.1)
Sodium: 138 mmol/L (ref 135–145)

## 2020-07-26 LAB — CBC WITH DIFFERENTIAL/PLATELET
Abs Immature Granulocytes: 0.01 10*3/uL (ref 0.00–0.07)
Basophils Absolute: 0.1 10*3/uL (ref 0.0–0.1)
Basophils Relative: 1 %
Eosinophils Absolute: 0.9 10*3/uL — ABNORMAL HIGH (ref 0.0–0.5)
Eosinophils Relative: 14 %
HCT: 39.4 % (ref 36.0–46.0)
Hemoglobin: 12.8 g/dL (ref 12.0–15.0)
Immature Granulocytes: 0 %
Lymphocytes Relative: 29 %
Lymphs Abs: 1.8 10*3/uL (ref 0.7–4.0)
MCH: 32.9 pg (ref 26.0–34.0)
MCHC: 32.5 g/dL (ref 30.0–36.0)
MCV: 101.3 fL — ABNORMAL HIGH (ref 80.0–100.0)
Monocytes Absolute: 0.5 10*3/uL (ref 0.1–1.0)
Monocytes Relative: 8 %
Neutro Abs: 3.1 10*3/uL (ref 1.7–7.7)
Neutrophils Relative %: 48 %
Platelets: 190 10*3/uL (ref 150–400)
RBC: 3.89 MIL/uL (ref 3.87–5.11)
RDW: 12.6 % (ref 11.5–15.5)
WBC: 6.4 10*3/uL (ref 4.0–10.5)
nRBC: 0 % (ref 0.0–0.2)

## 2020-07-26 LAB — IRON AND TIBC
Iron: 94 ug/dL (ref 28–170)
Saturation Ratios: 29 % (ref 10.4–31.8)
TIBC: 329 ug/dL (ref 250–450)
UIBC: 235 ug/dL

## 2020-07-26 LAB — PROTEIN / CREATININE RATIO, URINE
Creatinine, Urine: 39.64 mg/dL
Protein Creatinine Ratio: 0.15 mg/mg{Cre} (ref 0.00–0.15)
Total Protein, Urine: 6 mg/dL

## 2020-07-26 LAB — POCT HEMOGLOBIN-HEMACUE: Hemoglobin: 13 g/dL (ref 12.0–15.0)

## 2020-07-26 MED ORDER — EPOETIN ALFA 3000 UNIT/ML IJ SOLN: 3000.0000 [IU] | Freq: Once | INTRAMUSCULAR | Status: DC

## 2020-07-27 LAB — PTH, INTACT AND CALCIUM
Calcium, Total (PTH): 9.4 mg/dL (ref 8.7–10.3)
PTH: 105 pg/mL — ABNORMAL HIGH (ref 15–65)

## 2020-08-01 ENCOUNTER — Encounter (HOSPITAL_COMMUNITY): Payer: Medicare Other | Admitting: Cardiology

## 2020-08-03 ENCOUNTER — Other Ambulatory Visit: Payer: Self-pay

## 2020-08-03 ENCOUNTER — Ambulatory Visit (HOSPITAL_COMMUNITY)
Admission: RE | Admit: 2020-08-03 | Discharge: 2020-08-03 | Disposition: A | Discharge status: Home / Self Care | Payer: Medicare Other | Source: Ambulatory Visit | Attending: Cardiology | Admitting: Cardiology

## 2020-08-03 VITALS — BP 124/72 | HR 90 | Wt 125.4 lb

## 2020-08-03 DIAGNOSIS — Z7901 Long term (current) use of anticoagulants: Secondary | ICD-10-CM | POA: Diagnosis not present

## 2020-08-03 DIAGNOSIS — I130 Hypertensive heart and chronic kidney disease with heart failure and stage 1 through stage 4 chronic kidney disease, or unspecified chronic kidney disease: Secondary | ICD-10-CM | POA: Insufficient documentation

## 2020-08-03 DIAGNOSIS — N184 Chronic kidney disease, stage 4 (severe): Secondary | ICD-10-CM | POA: Diagnosis not present

## 2020-08-03 DIAGNOSIS — Z853 Personal history of malignant neoplasm of breast: Secondary | ICD-10-CM | POA: Insufficient documentation

## 2020-08-03 DIAGNOSIS — I272 Pulmonary hypertension, unspecified: Secondary | ICD-10-CM | POA: Diagnosis not present

## 2020-08-03 DIAGNOSIS — I5042 Chronic combined systolic (congestive) and diastolic (congestive) heart failure: Secondary | ICD-10-CM | POA: Insufficient documentation

## 2020-08-03 DIAGNOSIS — D631 Anemia in chronic kidney disease: Secondary | ICD-10-CM | POA: Diagnosis not present

## 2020-08-03 DIAGNOSIS — J45909 Unspecified asthma, uncomplicated: Secondary | ICD-10-CM | POA: Insufficient documentation

## 2020-08-03 DIAGNOSIS — I252 Old myocardial infarction: Secondary | ICD-10-CM | POA: Insufficient documentation

## 2020-08-03 DIAGNOSIS — I313 Pericardial effusion (noninflammatory): Secondary | ICD-10-CM | POA: Insufficient documentation

## 2020-08-03 DIAGNOSIS — I4819 Other persistent atrial fibrillation: Secondary | ICD-10-CM

## 2020-08-03 DIAGNOSIS — Z79899 Other long term (current) drug therapy: Secondary | ICD-10-CM | POA: Insufficient documentation

## 2020-08-03 DIAGNOSIS — I5032 Chronic diastolic (congestive) heart failure: Secondary | ICD-10-CM | POA: Diagnosis not present

## 2020-08-03 DIAGNOSIS — Z7951 Long term (current) use of inhaled steroids: Secondary | ICD-10-CM | POA: Insufficient documentation

## 2020-08-03 DIAGNOSIS — Z87891 Personal history of nicotine dependence: Secondary | ICD-10-CM | POA: Diagnosis not present

## 2020-08-03 DIAGNOSIS — I482 Chronic atrial fibrillation, unspecified: Secondary | ICD-10-CM | POA: Insufficient documentation

## 2020-08-03 MED ORDER — POTASSIUM CHLORIDE CRYS ER 20 MEQ PO TBCR: 20.0000 meq | EXTENDED_RELEASE_TABLET | Freq: Every day | ORAL | 3 refills | Status: DC

## 2020-08-03 NOTE — Progress Notes (Signed)
PCP: [name], MD HF Cardiology: Dr. Aundra Dubin  80 y.o. with history of CKD 4, chronic atrial fibrillation, anemia, and chronic systolic CHF was referred initially by [name] for evaluation of CHF.  Patient has a complex past history.  She has a long history of atrial fibrillation, now chronic as she has failed DCCV and Tikosyn and did not tolerate amiodarone.  Her last echo in 6/21 showed EF 35-40% with mild RV dysfunction, large pericardial effusion without tamponade, moderate MR, moderate TR.  Cause of cardiomyopathy is not certain.    She has been admitted multiple times this year to [name]. She feels like hospitalizations have not helped her shortness of breath.  She was admitted to [facility] 10/29/19.  She was found to be anemic with hgb 6.4, FOBT+.  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  Hgb was 7.5 at time of discharge.  Eliquis was helped for several days, she was told to restart it on 11/04/19.    She was admitted again in 8/21 to [facility] with CHF exacerbation.  RHC in 8/21 showed elevated R>L filling pressures with mild pulmonary hypertension and low PVR. PAPi was low suggesting significant RV dysfunction.  Echo done today and reviewed showed EF 50-55% with septal hypokinesis, mild RV enlargement with mildly decreased RV systolic function, PASP 44 mmHg, moderate MR with 2 eccentric jets, moderate-severe TR, moderate lateral pericardial effusion (improved from prior echo).   PYP scan in 11/21 was grade 2 with H/CL 1.36. Negative myeloma panel and urine immunofixation. Invitae gene testing was negative for TTR mutations.   Echo in 2/22 showed EF 60-65%, normal RV, moderate biatrial enlargement, moderate MR, mild AS.   She returns today for followup of CHF.  Weight 5 lbs higher today.  She still feels good in general.  She has arthritis in her hands, this is her main complaint.  No dyspnea walking on flat ground. Has seasonal allergies, gets some wheezing and is using  albuterol.  She decided not to take Jardiance as she is prone to UTIs.  She remains in chronic atrial fibrillation.    ECG (personally reviewed): atrial fibrillation rate 90  Labs (8/21): K 3.9, creatinine 2.35, hgb 7.5, FOBT+ Labs (9/21): K 3.7, creatinine 2.44 Labs (10/21): hgb 11.9, K 3.5, creatinine 1.97 Las (11/21): hgb 12.3 Labs (12/21): K 3.6, creatinine 2.04 Labs (1/22): hgb 12.5 Labs (5/22): K 4, creatinine 1.9, hgb 12.8  PMH: 1. CKD stage 4 2. Chronic atrial fibrillation: Failed DCCV in 11/18.  - Failed Tikosyn in 9/19.  - Intolerant of calcium channel blockers.  - Intolerant of amiodarone.  3. HTN 4. Asthma 5. GERD 6. Chronic systolic => diastolic CHF: Uncertain etiology.  - Echo (6/21): EF 35-40%, global hypokinesis, mildly decreased RV systolic function with mild RV dilation, biatrial enlargement, large pericardial effusion without tamponade, moderate MR, moderate TR, IVC dilated.  - RHC (8/21): mean RA 18, PA 35/18, mean PCWP 16, CI 3.6, PVR 1.5 WU, PAPi 0.9 => primarily RV failure - V/Q scan (8/21): Not suggestive of chronic PEs - Echo (10/21): EF 50-55% with septal hypokinesis, mild RV enlargement with mildly decreased RV systolic function, PASP 44 mmHg, moderate MR with 2 eccentric jets, moderate-severe TR, moderate lateral pericardial effusion (improved from prior echo)  - PYP scan (11/21): grade 2 with H/CL 1.36.  Myeloma panel negative, urine immunofixation negative. Invitae gene testing was negative for TTR mutations.  - Echo (2/22): EF 60-65%, normal RV, moderate biatrial  enlargement, moderate MR, mild AS. 7. Anemia of chronic renal disease.  8. Breast cancer: Bilateral mastectomy.  9. GI bleeding: Admission in 8/21. EGD/c-scope in 6/21 negative.  10. Aortic stenosis: Mild on 2/22 echo.   Social History   Socioeconomic History  . Marital status: Married    Spouse name: [name]  . Number of children: 1  . Years of education: Not on file  . Highest education  level: Not on file  Occupational History  . Occupation: retired  Tobacco Use  . Smoking status: Former Smoker    Packs/day: 1.00    Years: 28.00    Pack years: 28.00    Types: Cigarettes    Quit date: 07/17/1983    Years since quitting: 37.0  . Smokeless tobacco: Never Used  Vaping Use  . Vaping Use: Never used  Substance and Sexual Activity  . Alcohol use: No  . Drug use: No  . Sexual activity: Not on file  Other Topics Concern  . Not on file  Social History Narrative  . Not on file   Social Determinants of Health   Financial Resource Strain: Not on file  Food Insecurity: No Food Insecurity  . Worried About Charity fundraiser in the Last Year: Never true  . Ran Out of Food in the Last Year: Never true  Transportation Needs: Not on file  Physical Activity: Not on file  Stress: Not on file  Social Connections: Moderately Integrated  . Frequency of Communication with Friends and Family: Once a week  . Frequency of Social Gatherings with Friends and Family: Once a week  . Attends Religious Services: 1 to 4 times per year  . Active Member of Clubs or Organizations: No  . Attends Archivist Meetings: 1 to 4 times per year  . Marital Status: Married  Human resources officer Violence: Not on file   Family History  Problem Relation Age of Onset  . Cancer Mother   . Aneurysm Father   . Cancer Sister   . Cancer Sister   . Anesthesia problems Neg Hx   . Hypotension Neg Hx   . Malignant hyperthermia Neg Hx   . Pseudochol deficiency Neg Hx   . Colon cancer Neg Hx    ROS: All systems reviewed and negative except as per HPI.  Current Outpatient Medications  Medication Sig Dispense Refill  . acetaminophen (TYLENOL) 650 MG CR tablet Take 650 mg by mouth every 8 (eight) hours as needed for pain.    Marland Kitchen albuterol (VENTOLIN HFA) 108 (90 Base) MCG/ACT inhaler Inhale 1-2 puffs into the lungs every 6 (six) hours as needed for wheezing or shortness of breath. 18 g 3  . ALPRAZolam  (XANAX) 0.5 MG tablet Take 0.5 mg by mouth at bedtime.    Marland Kitchen apixaban (ELIQUIS) 2.5 MG TABS tablet Take 1 tablet (2.5 mg total) by mouth 2 (two) times daily. 60 tablet 11  . bisoprolol (ZEBETA) 5 MG tablet Take 0.5 tablets (2.5 mg total) by mouth daily. 45 tablet 3  . calcium citrate-vitamin D (CITRACAL+D) 315-200 MG-UNIT tablet Take 1 tablet by mouth 2 (two) times daily.    . fluticasone (FLONASE) 50 MCG/ACT nasal spray Place 2 sprays into both nostrils daily. 16 g 0  . mometasone-formoterol (DULERA) 100-5 MCG/ACT AERO Inhale 2 puffs into the lungs in the morning and at bedtime. 1 each 0  . Multiple Vitamins-Minerals (MULTIVITAMINS THER. W/MINERALS) TABS tablet Take 1 tablet by mouth daily.    Marland Kitchen omeprazole (PRILOSEC)  40 MG capsule Take 40 mg by mouth as needed.    . torsemide (DEMADEX) 20 MG tablet Take 3 tablets (60 mg total) by mouth 2 (two) times daily. 540 tablet 3  . vitamin B-12 (CYANOCOBALAMIN) 500 MCG tablet Take 500 mcg by mouth daily.    . potassium chloride SA (KLOR-CON) 20 MEQ tablet Take 1 tablet (20 mEq total) by mouth daily. 90 tablet 3   No current facility-administered medications for this encounter.   BP 124/72   Pulse 90   Wt 56.9 kg (125 lb 6 oz)   SpO2 97%   BMI 21.52 kg/m  General: NAD Neck: JVP 7-8 cm, no thyromegaly or thyroid nodule.  Lungs: End expiratory wheezes CV: Nondisplaced PMI.  Heart regular S1/S2, no S3/S4,1/6 SEM RUSB.  No peripheral edema.  No carotid bruit.  Normal pedal pulses.  Abdomen: Soft, nontender, no hepatosplenomegaly, no distention.  Skin: Intact without lesions or rashes.  Neurologic: Alert and oriented x 3.  Psych: Normal affect. Extremities: No clubbing or cyanosis.  HEENT: Normal.   Assessment/Plan: 1. Chronic systolic => diastolic CHF:  Prominent RV failure.  Echo in 6/21 with EF 35-40% with mild RV dysfunction, large pericardial effusion without tamponade, moderate MR, moderate TR.  RHC in 8/21 with preserved cardiac output,  primarily RV failure, mild pulmonary hypertension with low PVR. V/Q scan in 8/21 not suggestive of chronic PEs.  Echo in 10/21 with EF 50-55% with septal hypokinesis, mild RV enlargement with mildly decreased RV systolic function, PASP 44 mmHg, moderate MR with 2 eccentric jets, moderate-severe TR, moderate lateral pericardial effusion (improved from prior echo).  PYP scan was equivocal for TTR cardiac amyloidosis (grade 2 but H/CL < 1.5), Invitae gene testing was negative for TTR mutations.  Echo in 2/22 showed EF 60-65%, normal RV, moderate biatrial enlargement, moderate MR, mild AS, no pericardial effusion.  Cause of cardiomyopathy is not certain. No chest pain.  She looks near-euvolemic.  NYHA class II symptoms.  - Continue torsemide 60 mg bid.  - She does not want to take SGLT2 inhibitor due to UTI risk.     - Continue KCl 20 daily.    - Continue bisoprolol 2.5 mg daily.  - No digoxin, Entresto, spironolactone with improved LV EF and CKD stage IV.  - With equivocal PYP scan, I will repeat PYP in 5/22 => will order.  2. Atrial fibrillation: Chronic, has failed DCCV and Tikosyn.  She has not tolerated amiodarone.  Rate control good on bisoprolol 2.5 mg daily.  - Continue bisoprolol 2.5 daily.  - Continue Eliquis 2.5 mg bid.   3. Anemia: Multifactorial, suspect due to renal disease as well as GI bleeding.  Hgb down to 6.5 in 8/21 in setting of GI bleeding with FOBT+.  Hgb up to 12.5 in 1/22.    - No blood products (Jehovah's Witness).  - Following with hematology for Aranesp/feraheme.  4. CKD: Stage IV.  BMET today.  5. Pericardial effusion: Large on 6/21 echo without tamponade, echo in 10/21 showed pericardial effusion moderate laterally (smaller).  Echo in 2/22 showed resolution of pericardial effusion.   Followup in 3 months with APP   [name] 08/03/2020

## 2020-08-03 NOTE — Patient Instructions (Signed)
Your physician recommends that you schedule a follow-up appointment in: 3 months  If you have any questions or concerns before your next appointment please send [place] a message through [place] or call our office at [phone].    TO LEAVE A MESSAGE FOR THE NURSE SELECT OPTION 2, PLEASE LEAVE A MESSAGE INCLUDING: . YOUR NAME . DATE OF BIRTH . CALL BACK NUMBER . REASON FOR CALL**this is important as we prioritize the call backs  [place] AS LONG AS YOU CALL BEFORE 4:00 PM  At the [facility], you and your health needs are our priority. As part of our continuing mission to provide you with exceptional heart care, we have created designated Provider Care Teams. These Care Teams include your primary Cardiologist (physician) and Advanced Practice Providers (APPs- Physician Assistants and Nurse Practitioners) who all work together to provide you with the care you need, when you need it.   You may see any of the following providers on your designated Care Team at your next follow up: Marland Kitchen Dr Glori Bickers . Dr [name] . Dr Vickki Muff . Darrick Grinder, NP . Lyda Jester, [place] . Audry Riles, PharmD   Please be sure to bring in all your medications bottles to every appointment.

## 2020-08-05 DIAGNOSIS — I129 Hypertensive chronic kidney disease with stage 1 through stage 4 chronic kidney disease, or unspecified chronic kidney disease: Secondary | ICD-10-CM | POA: Diagnosis not present

## 2020-08-05 DIAGNOSIS — I5022 Chronic systolic (congestive) heart failure: Secondary | ICD-10-CM | POA: Diagnosis not present

## 2020-08-05 DIAGNOSIS — N184 Chronic kidney disease, stage 4 (severe): Secondary | ICD-10-CM | POA: Diagnosis not present

## 2020-08-05 DIAGNOSIS — E211 Secondary hyperparathyroidism, not elsewhere classified: Secondary | ICD-10-CM | POA: Diagnosis not present

## 2020-08-15 ENCOUNTER — Telehealth (HOSPITAL_COMMUNITY): Payer: Self-pay | Admitting: *Deleted

## 2020-08-15 NOTE — Telephone Encounter (Signed)
Pt left vm requesting return call on torsemide dose. I called pt back no answer.

## 2020-08-16 ENCOUNTER — Other Ambulatory Visit (HOSPITAL_COMMUNITY): Payer: Self-pay | Admitting: *Deleted

## 2020-08-16 MED ORDER — TORSEMIDE 20 MG PO TABS: 60.0000 mg | ORAL_TABLET | Freq: Two times a day (BID) | ORAL | 3 refills | Status: DC

## 2020-08-17 NOTE — Progress Notes (Signed)
PYP auth in clinical review.

## 2020-08-23 ENCOUNTER — Other Ambulatory Visit: Payer: Self-pay

## 2020-08-23 ENCOUNTER — Encounter (HOSPITAL_COMMUNITY): Payer: Self-pay

## 2020-08-23 ENCOUNTER — Encounter (HOSPITAL_COMMUNITY)
Admission: RE | Admit: 2020-08-23 | Discharge: 2020-08-23 | Disposition: A | Discharge status: Home / Self Care | Payer: Medicare Other | Source: Ambulatory Visit | Attending: Nephrology | Admitting: Nephrology

## 2020-08-23 DIAGNOSIS — N184 Chronic kidney disease, stage 4 (severe): Secondary | ICD-10-CM | POA: Diagnosis not present

## 2020-08-23 DIAGNOSIS — D631 Anemia in chronic kidney disease: Secondary | ICD-10-CM | POA: Diagnosis not present

## 2020-08-23 LAB — POCT HEMOGLOBIN-HEMACUE: Hemoglobin: 11.8 g/dL — ABNORMAL LOW (ref 12.0–15.0)

## 2020-08-23 MED ORDER — EPOETIN ALFA 3000 UNIT/ML IJ SOLN: 3000.0000 [IU] | Freq: Once | INTRAMUSCULAR | Status: DC

## 2020-09-07 DIAGNOSIS — N1832 Chronic kidney disease, stage 3b: Secondary | ICD-10-CM | POA: Diagnosis not present

## 2020-09-07 DIAGNOSIS — M159 Polyosteoarthritis, unspecified: Secondary | ICD-10-CM | POA: Diagnosis not present

## 2020-09-07 DIAGNOSIS — M545 Low back pain, unspecified: Secondary | ICD-10-CM | POA: Diagnosis not present

## 2020-09-07 DIAGNOSIS — M810 Age-related osteoporosis without current pathological fracture: Secondary | ICD-10-CM | POA: Diagnosis not present

## 2020-09-07 DIAGNOSIS — D509 Iron deficiency anemia, unspecified: Secondary | ICD-10-CM | POA: Diagnosis not present

## 2020-09-07 DIAGNOSIS — J4540 Moderate persistent asthma, uncomplicated: Secondary | ICD-10-CM | POA: Diagnosis not present

## 2020-09-07 DIAGNOSIS — K219 Gastro-esophageal reflux disease without esophagitis: Secondary | ICD-10-CM | POA: Diagnosis not present

## 2020-09-07 DIAGNOSIS — G4701 Insomnia due to medical condition: Secondary | ICD-10-CM | POA: Diagnosis not present

## 2020-09-07 DIAGNOSIS — I4821 Permanent atrial fibrillation: Secondary | ICD-10-CM | POA: Diagnosis not present

## 2020-09-07 DIAGNOSIS — E211 Secondary hyperparathyroidism, not elsewhere classified: Secondary | ICD-10-CM | POA: Diagnosis not present

## 2020-09-20 ENCOUNTER — Encounter (HOSPITAL_COMMUNITY)
Admission: RE | Admit: 2020-09-20 | Discharge: 2020-09-20 | Disposition: A | Discharge status: Home / Self Care | Payer: Medicare Other | Source: Ambulatory Visit | Attending: Nephrology | Admitting: Nephrology

## 2020-09-20 ENCOUNTER — Other Ambulatory Visit: Payer: Self-pay

## 2020-09-20 DIAGNOSIS — N184 Chronic kidney disease, stage 4 (severe): Secondary | ICD-10-CM | POA: Insufficient documentation

## 2020-09-20 LAB — RENAL FUNCTION PANEL
Albumin: 3.9 g/dL (ref 3.5–5.0)
Anion gap: 8 (ref 5–15)
BUN: 20 mg/dL (ref 8–23)
CO2: 31 mmol/L (ref 22–32)
Calcium: 8.8 mg/dL — ABNORMAL LOW (ref 8.9–10.3)
Chloride: 100 mmol/L (ref 98–111)
Creatinine, Ser: 1.69 mg/dL — ABNORMAL HIGH (ref 0.44–1.00)
GFR, Estimated: 30 mL/min — ABNORMAL LOW (ref >60)
Glucose, Bld: 103 mg/dL — ABNORMAL HIGH (ref 70–99)
Phosphorus: 3.3 mg/dL (ref 2.5–4.6)
Potassium: 3.5 mmol/L (ref 3.5–5.1)
Sodium: 139 mmol/L (ref 135–145)

## 2020-09-20 LAB — IRON AND TIBC
Iron: 124 ug/dL (ref 28–170)
Saturation Ratios: 39 % — ABNORMAL HIGH (ref 10.4–31.8)
TIBC: 322 ug/dL (ref 250–450)
UIBC: 198 ug/dL

## 2020-09-20 LAB — CBC WITH DIFFERENTIAL/PLATELET
Abs Immature Granulocytes: 0.02 10*3/uL (ref 0.00–0.07)
Basophils Absolute: 0.1 10*3/uL (ref 0.0–0.1)
Basophils Relative: 1 %
Eosinophils Absolute: 0.8 10*3/uL — ABNORMAL HIGH (ref 0.0–0.5)
Eosinophils Relative: 9 %
HCT: 40.9 % (ref 36.0–46.0)
Hemoglobin: 12.9 g/dL (ref 12.0–15.0)
Immature Granulocytes: 0 %
Lymphocytes Relative: 28 %
Lymphs Abs: 2.3 10*3/uL (ref 0.7–4.0)
MCH: 32.6 pg (ref 26.0–34.0)
MCHC: 31.5 g/dL (ref 30.0–36.0)
MCV: 103.3 fL — ABNORMAL HIGH (ref 80.0–100.0)
Monocytes Absolute: 0.6 10*3/uL (ref 0.1–1.0)
Monocytes Relative: 7 %
Neutro Abs: 4.5 10*3/uL (ref 1.7–7.7)
Neutrophils Relative %: 55 %
Platelets: 168 10*3/uL (ref 150–400)
RBC: 3.96 MIL/uL (ref 3.87–5.11)
RDW: 12.2 % (ref 11.5–15.5)
WBC: 8.2 10*3/uL (ref 4.0–10.5)
nRBC: 0 % (ref 0.0–0.2)

## 2020-09-20 LAB — PROTEIN / CREATININE RATIO, URINE
Creatinine, Urine: 35.39 mg/dL
Total Protein, Urine: <6 mg/dL

## 2020-09-20 LAB — POCT HEMOGLOBIN-HEMACUE: Hemoglobin: 12.8 g/dL (ref 12.0–15.0)

## 2020-09-21 LAB — PTH, INTACT AND CALCIUM
Calcium, Total (PTH): 8.9 mg/dL (ref 8.7–10.3)
PTH: 72 pg/mL — ABNORMAL HIGH (ref 15–65)

## 2020-09-27 ENCOUNTER — Ambulatory Visit: Payer: Medicare Other | Admitting: Family Medicine

## 2020-10-07 DIAGNOSIS — I129 Hypertensive chronic kidney disease with stage 1 through stage 4 chronic kidney disease, or unspecified chronic kidney disease: Secondary | ICD-10-CM | POA: Diagnosis not present

## 2020-10-07 DIAGNOSIS — I5022 Chronic systolic (congestive) heart failure: Secondary | ICD-10-CM | POA: Diagnosis not present

## 2020-10-07 DIAGNOSIS — N1832 Chronic kidney disease, stage 3b: Secondary | ICD-10-CM | POA: Diagnosis not present

## 2020-10-07 DIAGNOSIS — E211 Secondary hyperparathyroidism, not elsewhere classified: Secondary | ICD-10-CM | POA: Diagnosis not present

## 2020-10-18 ENCOUNTER — Encounter (HOSPITAL_COMMUNITY)
Admission: RE | Admit: 2020-10-18 | Discharge: 2020-10-18 | Disposition: A | Discharge status: Home / Self Care | Payer: Medicare Other | Source: Ambulatory Visit | Attending: Nephrology | Admitting: Nephrology

## 2020-10-18 ENCOUNTER — Encounter (HOSPITAL_COMMUNITY): Payer: Self-pay

## 2020-10-18 ENCOUNTER — Other Ambulatory Visit: Payer: Self-pay

## 2020-10-18 DIAGNOSIS — D631 Anemia in chronic kidney disease: Secondary | ICD-10-CM | POA: Diagnosis not present

## 2020-10-18 DIAGNOSIS — N184 Chronic kidney disease, stage 4 (severe): Secondary | ICD-10-CM | POA: Insufficient documentation

## 2020-10-18 LAB — RENAL FUNCTION PANEL
Albumin: 4 g/dL (ref 3.5–5.0)
Anion gap: 9 (ref 5–15)
BUN: 23 mg/dL (ref 8–23)
CO2: 29 mmol/L (ref 22–32)
Calcium: 8.9 mg/dL (ref 8.9–10.3)
Chloride: 100 mmol/L (ref 98–111)
Creatinine, Ser: 1.78 mg/dL — ABNORMAL HIGH (ref 0.44–1.00)
GFR, Estimated: 28 mL/min — ABNORMAL LOW (ref >60)
Glucose, Bld: 99 mg/dL (ref 70–99)
Phosphorus: 3.6 mg/dL (ref 2.5–4.6)
Potassium: 3.7 mmol/L (ref 3.5–5.1)
Sodium: 138 mmol/L (ref 135–145)

## 2020-10-18 LAB — CBC WITH DIFFERENTIAL/PLATELET
Abs Immature Granulocytes: 0.01 10*3/uL (ref 0.00–0.07)
Basophils Absolute: 0.1 10*3/uL (ref 0.0–0.1)
Basophils Relative: 1 %
Eosinophils Absolute: 1.3 10*3/uL — ABNORMAL HIGH (ref 0.0–0.5)
Eosinophils Relative: 19 %
HCT: 37.8 % (ref 36.0–46.0)
Hemoglobin: 12.5 g/dL (ref 12.0–15.0)
Immature Granulocytes: 0 %
Lymphocytes Relative: 28 %
Lymphs Abs: 1.9 10*3/uL (ref 0.7–4.0)
MCH: 33.2 pg (ref 26.0–34.0)
MCHC: 33.1 g/dL (ref 30.0–36.0)
MCV: 100.3 fL — ABNORMAL HIGH (ref 80.0–100.0)
Monocytes Absolute: 0.6 10*3/uL (ref 0.1–1.0)
Monocytes Relative: 8 %
Neutro Abs: 3 10*3/uL (ref 1.7–7.7)
Neutrophils Relative %: 44 %
Platelets: 191 10*3/uL (ref 150–400)
RBC: 3.77 MIL/uL — ABNORMAL LOW (ref 3.87–5.11)
RDW: 12.3 % (ref 11.5–15.5)
WBC: 6.9 10*3/uL (ref 4.0–10.5)
nRBC: 0 % (ref 0.0–0.2)

## 2020-10-18 LAB — IRON AND TIBC
Iron: 82 ug/dL (ref 28–170)
Saturation Ratios: 24 % (ref 10.4–31.8)
TIBC: 347 ug/dL (ref 250–450)
UIBC: 265 ug/dL

## 2020-10-18 LAB — POCT HEMOGLOBIN-HEMACUE: Hemoglobin: 12.5 g/dL (ref 12.0–15.0)

## 2020-10-18 LAB — PROTEIN / CREATININE RATIO, URINE
Creatinine, Urine: 52.19 mg/dL
Protein Creatinine Ratio: 0.11 mg/mg{Cre} (ref 0.00–0.15)
Total Protein, Urine: 6 mg/dL

## 2020-10-18 MED ORDER — EPOETIN ALFA 3000 UNIT/ML IJ SOLN: 3000.0000 [IU] | Freq: Once | INTRAMUSCULAR | Status: DC

## 2020-10-18 NOTE — Progress Notes (Signed)
Results for CHAMIA, SCHMUTZ (MRN [id number]) as of 10/18/2020 16:   Ref. Range 10/18/2020 10:20 10/18/2020 10:25 10/18/2020 10:26  Sodium Latest Ref Range: 135 - 145 mmol/L   138  Potassium Latest Ref Range: 3.5 - 5.1 mmol/L   3.7  Chloride Latest Ref Range: 98 - 111 mmol/L   100  CO2 Latest Ref Range: 22 - 32 mmol/L   29  Glucose Latest Ref Range: 70 - 99 mg/dL   99  BUN Latest Ref Range: 8 - 23 mg/dL   23  Creatinine Latest Ref Range: 0.44 - 1.00 mg/dL   1.78 (H)  Calcium Latest Ref Range: 8.9 - 10.3 mg/dL   8.9  Anion gap Latest Ref Range: 5 - 15    9  Phosphorus Latest Ref Range: 2.5 - 4.6 mg/dL   3.6  Albumin Latest Ref Range: 3.5 - 5.0 g/dL   4.0  GFR, Estimated Latest Ref Range: >60 mL/min   28 (L)  Iron Latest Ref Range: 28 - 170 ug/dL   82  UIBC Latest Units: ug/dL   265  TIBC Latest Ref Range: 250 - 450 ug/dL   347  Saturation Ratios Latest Ref Range: 10.4 - 31.8 %   24  WBC Latest Ref Range: 4.0 - 10.5 K/uL  6.9   RBC Latest Ref Range: 3.87 - 5.11 MIL/uL  3.77 (L)   Hemoglobin Latest Ref Range: 12.0 - 15.0 g/dL 12.5 12.5   HCT Latest Ref Range: 36.0 - 46.0 %  37.8   MCV Latest Ref Range: 80.0 - 100.0 fL  100.3 (H)   MCH Latest Ref Range: 26.0 - 34.0 pg  33.2   MCHC Latest Ref Range: 30.0 - 36.0 g/dL  33.1   RDW Latest Ref Range: 11.5 - 15.5 %  12.3   Platelets Latest Ref Range: 150 - 400 K/uL  191   nRBC Latest Ref Range: 0.0 - 0.2 %  0.0   Neutrophils Latest Units: %  44   Lymphocytes Latest Units: %  28   Monocytes Relative Latest Units: %  8   Eosinophil Latest Units: %  19   Basophil Latest Units: %  1   Immature Granulocytes Latest Units: %  0   NEUT# Latest Ref Range: 1.7 - 7.7 K/uL  3.0   Lymphocyte # Latest Ref Range: 0.7 - 4.0 K/uL  1.9   Monocyte # Latest Ref Range: 0.1 - 1.0 K/uL  0.6   Eosinophils Absolute Latest Ref Range: 0.0 - 0.5 K/uL  1.3 (H)   Basophils Absolute Latest Ref Range: 0.0 - 0.1 K/uL  0.1   Abs Immature Granulocytes Latest Ref Range: 0.00  - 0.07 K/uL  0.01   Total Protein, Urine Latest Units: mg/dL  6   Protein Creatinine Ratio Latest Ref Range: 0.00 - 0.15 mg/mgCre  0.11   Creatinine, Urine Latest Units: mg/dL  52.19

## 2020-10-19 LAB — PTH, INTACT AND CALCIUM
Calcium, Total (PTH): 9 mg/dL (ref 8.7–10.3)
PTH: 137 pg/mL — ABNORMAL HIGH (ref 15–65)

## 2020-10-31 NOTE — Progress Notes (Signed)
PCP: [name], MD HF Cardiology: Dr. Aundra Dubin  80 y.o. with history of CKD 4, chronic atrial fibrillation, anemia, and chronic systolic CHF was referred initially by [name] for evaluation of CHF.  Patient has a complex past history.  She has a long history of atrial fibrillation, now chronic as she has failed DCCV and Tikosyn and did not tolerate amiodarone.  Her last echo in 6/21 showed EF 35-40% with mild RV dysfunction, large pericardial effusion without tamponade, moderate MR, moderate TR.  Cause of cardiomyopathy is not certain.    She has been admitted multiple times this year to [facility]. She feels like hospitalizations have not helped her shortness of breath.  She was admitted to [facility] 10/29/19.  She was found to be anemic with hgb 6.4, FOBT+.  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  Hgb was 7.5 at time of discharge.  Eliquis was helped for several days, she was told to restart it on 11/04/19.    She was admitted again in 8/21 to [facility] with CHF exacerbation.  RHC in 8/21 showed elevated R>L filling pressures with mild pulmonary hypertension and low PVR. PAPi was low suggesting significant RV dysfunction.  Echo done today and reviewed showed EF 50-55% with septal hypokinesis, mild RV enlargement with mildly decreased RV systolic function, PASP 44 mmHg, moderate MR with 2 eccentric jets, moderate-severe TR, moderate lateral pericardial effusion (improved from prior echo).   PYP scan in 11/21 was grade 2 with H/CL 1.36. Negative myeloma panel and urine immunofixation. [employer] gene testing was negative for TTR mutations.   Echo in 2/22 showed EF 60-65%, normal RV, moderate biatrial enlargement, moderate MR, mild AS.   Today she returns for HF follow up. Overall feeling fine. Still struggling with arthritis pain in her hands. Using her albuterol inhaler more frequently, says her allergies are acting up . Some SOB walking further distances on flat ground. Denies CP, dizziness,  edema, or PND/Orthopnea. Some + bendopnea. Appetite ok. No fever or chills. Taking all medications.   ECG (personally reviewed): atrial fibrillation rate 76  Labs (8/21): K 3.9, creatinine 2.35, hgb 7.5, FOBT+ Labs (9/21): K 3.7, creatinine 2.44 Labs (10/21): hgb 11.9, K 3.5, creatinine 1.97 Las (11/21): hgb 12.3 Labs (12/21): K 3.6, creatinine 2.04 Labs (1/22): hgb 12.5 Labs (5/22): K 4, creatinine 1.9, hgb 12.8 Labs (7/22): K 3.7, creatinine 1.78  PMH: 1. CKD stage 4 2. Chronic atrial fibrillation: Failed DCCV in 11/18.  - Failed Tikosyn in 9/19.  - Intolerant of calcium channel blockers.  - Intolerant of amiodarone.  3. HTN 4. Asthma 5. GERD 6. Chronic systolic => diastolic CHF: Uncertain etiology.  - Echo (6/21): EF 35-40%, global hypokinesis, mildly decreased RV systolic function with mild RV dilation, biatrial enlargement, large pericardial effusion without tamponade, moderate MR, moderate TR, IVC dilated.  - RHC (8/21): mean RA 18, PA 35/18, mean PCWP 16, CI 3.6, PVR 1.5 WU, PAPi 0.9 => primarily RV failure - V/Q scan (8/21): Not suggestive of chronic PEs - Echo (10/21): EF 50-55% with septal hypokinesis, mild RV enlargement with mildly decreased RV systolic function, PASP 44 mmHg, moderate MR with 2 eccentric jets, moderate-severe TR, moderate lateral pericardial effusion (improved from prior echo)  - PYP scan (11/21): grade 2 with H/CL 1.36.  Myeloma panel negative, urine immunofixation negative. [employer]ae gene testing was negative for TTR mutations.  - Echo (2/22): EF 60-65%, normal RV, moderate biatrial enlargement, moderate MR, mild AS. 7. Anemia  of chronic renal disease.  8. Breast cancer: Bilateral mastectomy.  9. GI bleeding: Admission in 8/21. EGD/c-scope in 6/21 negative.  10. Aortic stenosis: Mild on 2/22 echo.   Social History   Socioeconomic History   Marital status: Married    Spouse name: [name]   Number of children: 1   Years of education: Not on file    Highest education level: Not on file  Occupational History   Occupation: retired  Tobacco Use   Smoking status: Former    Packs/day: 1.00    Years: 28.00    Pack years: 28.00    Types: Cigarettes    Quit date: 07/17/1983    Years since quitting: 37.3   Smokeless tobacco: Never  Vaping Use   Vaping Use: Never used  Substance and Sexual Activity   Alcohol use: No   Drug use: No   Sexual activity: Not on file  Other Topics Concern   Not on file  Social History Narrative   Not on file   Social Determinants of Health   Financial Resource Strain: Not on file  Food Insecurity: No Food Insecurity   Worried About Running Out of Food in the Last Year: Never true   Ran Out of Food in the Last Year: Never true  Transportation Needs: Not on file  Physical Activity: Not on file  Stress: Not on file  Social Connections: Moderately Integrated   Frequency of Communication with Friends and Family: Once a week   Frequency of Social Gatherings with Friends and Family: Once a week   Attends Religious Services: 1 to 4 times per year   Active Member of [employer] or Organizations: No   Attends Music therapist: 1 to 4 times per year   Marital Status: Married  Human resources officer Violence: Not on file   Family History  Problem Relation Age of Onset   Cancer Mother    Aneurysm Father    Cancer Sister    Cancer Sister    Anesthesia problems Neg Hx    Hypotension Neg Hx    Malignant hyperthermia Neg Hx    Pseudochol deficiency Neg Hx    Colon cancer Neg Hx    ROS: All systems reviewed and negative except as per HPI.  Current Outpatient Medications  Medication Sig Dispense Refill   acetaminophen (TYLENOL) 650 MG CR tablet Take 650 mg by mouth every 8 (eight) hours as needed for pain.     albuterol (VENTOLIN HFA) 108 (90 Base) MCG/ACT inhaler Inhale 1-2 puffs into the lungs every 6 (six) hours as needed for wheezing or shortness of breath. 18 g 3   ALPRAZolam (XANAX) 0.5 MG tablet  Take 0.5 mg by mouth at bedtime.     apixaban (ELIQUIS) 2.5 MG TABS tablet Take 1 tablet (2.5 mg total) by mouth 2 (two) times daily. 60 tablet 11   bisoprolol (ZEBETA) 5 MG tablet Take 0.5 tablets (2.5 mg total) by mouth daily. 45 tablet 3   calcium citrate-vitamin D (CITRACAL+D) 315-200 MG-UNIT tablet Take 1 tablet by mouth 2 (two) times daily.     fluticasone (FLONASE) 50 MCG/ACT nasal spray Place 2 sprays into both nostrils daily. 16 g 0   Multiple Vitamins-Minerals (MULTIVITAMINS THER. W/MINERALS) TABS tablet Take 1 tablet by mouth daily.     omeprazole (PRILOSEC) 40 MG capsule Take 40 mg by mouth as needed.     potassium chloride SA (KLOR-CON) 20 MEQ tablet Take 1 tablet (20 mEq total) by mouth daily. [place]  tablet 3   torsemide (DEMADEX) 20 MG tablet Take 3 tablets (60 mg total) by mouth 2 (two) times daily. 540 tablet 3   vitamin B-12 (CYANOCOBALAMIN) 500 MCG tablet Take 500 mcg by mouth daily.     No current facility-administered medications for this encounter.   Wt Readings from Last 3 Encounters:  11/01/20 56.8 kg (125 lb 3.2 oz)  08/23/20 56.9 kg (125 lb 6 oz)  08/03/20 56.9 kg (125 lb 6 oz)   BP 129/67   Pulse 83   Wt 56.8 kg (125 lb 3.2 oz)   SpO2 96%   BMI 21.49 kg/m   General:  NAD. No resp difficulty HEENT: Normal Neck: Supple. JVP 5-6 Carotids 2+ bilat; no bruits. No lymphadenopathy or thryomegaly appreciated. Cor: PMI nondisplaced. Irregular rate & rhythm. No rubs, gallops or murmurs. Lungs: Faint exp wheezes throughout all lung fields. Abdomen: Soft, nontender, nondistended. No hepatosplenomegaly. No bruits or masses. Good bowel sounds. Extremities: No cyanosis, clubbing, rash, edema Neuro: Alert & oriented x 3, cranial nerves grossly intact. Moves all 4 extremities w/o difficulty. Affect pleasant.  Assessment/Plan: 1. Chronic systolic => diastolic CHF:  Prominent RV failure.  Echo in 6/21 with EF 35-40% with mild RV dysfunction, large pericardial effusion without  tamponade, moderate MR, moderate TR.  RHC in 8/21 with preserved cardiac output, primarily RV failure, mild pulmonary hypertension with low PVR. V/Q scan in 8/21 not suggestive of chronic PEs.  Echo in 10/21 with EF 50-55% with septal hypokinesis, mild RV enlargement with mildly decreased RV systolic function, PASP 44 mmHg, moderate MR with 2 eccentric jets, moderate-severe TR, moderate lateral pericardial effusion (improved from prior echo).  PYP scan was equivocal for TTR cardiac amyloidosis (grade 2 but H/CL < 1.5), [employer]ae gene testing was negative for TTR mutations.  Echo in 2/22 showed EF 60-65%, normal RV, moderate biatrial enlargement, moderate MR, mild AS, no pericardial effusion.  Cause of cardiomyopathy is not certain. No chest pain.  She looks near-euvolemic.  NYHA class II symptoms.  - Continue torsemide 60 mg bid. BMET today. - Continue KCl 20 daily.    - Continue bisoprolol 2.5 mg daily.  - She does not want to take SGLT2 inhibitor due to UTI risk.   - No digoxin, Entresto, spironolactone with improved LV EF and CKD stage IV.  - With equivocal PYP scan, I will repeat PYP in 5/22 => this has been re-ordered but patient's husband tells me insurance company turned down prior authorization. They would like to hold off to repeating scan for now and discuss with Dr. Aundra Dubin at follow up appt. 2. Atrial fibrillation: Chronic, has failed DCCV and Tikosyn.  She has not tolerated amiodarone.  Rate control good on bisoprolol 2.5 mg daily.  - Continue bisoprolol 2.5 daily.  - Continue Eliquis 2.5 mg bid.  No abnormal bleeding. 3. Anemia: Multifactorial, suspect due to renal disease as well as GI bleeding.  Hgb down to 6.5 in 8/21 in setting of GI bleeding with FOBT+.  Hgb up to 12.5 in 7/22.    - No blood products ([employer]).  - Following with hematology for Aranesp/feraheme.  4. CKD: Stage IV.  BMET today.  5. Pericardial effusion: Large on 6/21 echo without tamponade, echo in 10/21 showed  pericardial effusion moderate laterally (smaller).  Echo in 2/22 showed resolution of pericardial effusion.  6. Wheezing: She tells me she has mild" asthma. She has been off of her Dulera inhaler due to cost. I offered to write her  another Rx  but she would like to hold off because she is in the donut hole with her medications. - Encouraged her to call her PCP or Pulmonary as they may be able to help her with samples.  Followup in 3 months with Dr. Aundra Dubin.  [place] FNP 11/01/2020

## 2020-11-01 ENCOUNTER — Other Ambulatory Visit: Payer: Self-pay

## 2020-11-01 ENCOUNTER — Encounter (HOSPITAL_COMMUNITY): Payer: Self-pay

## 2020-11-01 ENCOUNTER — Other Ambulatory Visit (HOSPITAL_COMMUNITY): Payer: Self-pay

## 2020-11-01 ENCOUNTER — Ambulatory Visit (HOSPITAL_COMMUNITY)
Admission: RE | Admit: 2020-11-01 | Discharge: 2020-11-01 | Disposition: A | Discharge status: Home / Self Care | Payer: Medicare Other | Source: Ambulatory Visit | Attending: Family Medicine | Admitting: Family Medicine

## 2020-11-01 ENCOUNTER — Telehealth (HOSPITAL_COMMUNITY): Payer: Self-pay | Admitting: Cardiology

## 2020-11-01 ENCOUNTER — Encounter (HOSPITAL_COMMUNITY): Payer: Self-pay | Admitting: Hematology

## 2020-11-01 VITALS — BP 129/67 | HR 83 | Wt 125.2 lb

## 2020-11-01 DIAGNOSIS — I5022 Chronic systolic (congestive) heart failure: Secondary | ICD-10-CM | POA: Diagnosis not present

## 2020-11-01 DIAGNOSIS — D638 Anemia in other chronic diseases classified elsewhere: Secondary | ICD-10-CM | POA: Diagnosis not present

## 2020-11-01 DIAGNOSIS — Z7901 Long term (current) use of anticoagulants: Secondary | ICD-10-CM | POA: Diagnosis not present

## 2020-11-01 DIAGNOSIS — Z87891 Personal history of nicotine dependence: Secondary | ICD-10-CM | POA: Diagnosis not present

## 2020-11-01 DIAGNOSIS — J45909 Unspecified asthma, uncomplicated: Secondary | ICD-10-CM | POA: Insufficient documentation

## 2020-11-01 DIAGNOSIS — I313 Pericardial effusion (noninflammatory): Secondary | ICD-10-CM | POA: Insufficient documentation

## 2020-11-01 DIAGNOSIS — Z79899 Other long term (current) drug therapy: Secondary | ICD-10-CM | POA: Diagnosis not present

## 2020-11-01 DIAGNOSIS — I4821 Permanent atrial fibrillation: Secondary | ICD-10-CM | POA: Diagnosis not present

## 2020-11-01 DIAGNOSIS — I130 Hypertensive heart and chronic kidney disease with heart failure and stage 1 through stage 4 chronic kidney disease, or unspecified chronic kidney disease: Secondary | ICD-10-CM | POA: Diagnosis not present

## 2020-11-01 DIAGNOSIS — D631 Anemia in chronic kidney disease: Secondary | ICD-10-CM | POA: Diagnosis not present

## 2020-11-01 DIAGNOSIS — I5042 Chronic combined systolic (congestive) and diastolic (congestive) heart failure: Secondary | ICD-10-CM | POA: Diagnosis not present

## 2020-11-01 DIAGNOSIS — I5032 Chronic diastolic (congestive) heart failure: Secondary | ICD-10-CM | POA: Diagnosis not present

## 2020-11-01 DIAGNOSIS — N184 Chronic kidney disease, stage 4 (severe): Secondary | ICD-10-CM | POA: Insufficient documentation

## 2020-11-01 DIAGNOSIS — R062 Wheezing: Secondary | ICD-10-CM | POA: Diagnosis not present

## 2020-11-01 DIAGNOSIS — I482 Chronic atrial fibrillation, unspecified: Secondary | ICD-10-CM | POA: Diagnosis not present

## 2020-11-01 DIAGNOSIS — I272 Pulmonary hypertension, unspecified: Secondary | ICD-10-CM | POA: Diagnosis not present

## 2020-11-01 LAB — BASIC METABOLIC PANEL
Anion gap: 12 (ref 5–15)
BUN: 19 mg/dL (ref 8–23)
CO2: 28 mmol/L (ref 22–32)
Calcium: 9.4 mg/dL (ref 8.9–10.3)
Chloride: 100 mmol/L (ref 98–111)
Creatinine, Ser: 1.83 mg/dL — ABNORMAL HIGH (ref 0.44–1.00)
GFR, Estimated: 27 mL/min — ABNORMAL LOW (ref >60)
Glucose, Bld: 103 mg/dL — ABNORMAL HIGH (ref 70–99)
Potassium: 3.4 mmol/L — ABNORMAL LOW (ref 3.5–5.1)
Sodium: 140 mmol/L (ref 135–145)

## 2020-11-01 MED ORDER — POTASSIUM CHLORIDE CRYS ER 20 MEQ PO TBCR: 40.0000 meq | EXTENDED_RELEASE_TABLET | Freq: Every day | ORAL | 3 refills | Status: AC

## 2020-11-01 NOTE — Telephone Encounter (Signed)
Pt aware and voiced understanding Will have labs repeated at standing lab appt 8/19

## 2020-11-01 NOTE — Telephone Encounter (Signed)
-----   Message from Rafael Bihari, [place] sent at 11/01/2020  1:37 PM EDT ----- Labs stable but potassium low. Please increase daily K supp to 40 mEq daily. Will need repeat  BMET in 10 days please

## 2020-11-01 NOTE — Progress Notes (Signed)
Medication Samples have been provided to the patient.  Drug name: Eliquis       Strength: 2.5 mg        Qty: 4  LOT: [id number]  Exp.Date: Jan 2023  Dosing instructions: Take 1 tablet Twice daily   The patient has been instructed regarding the correct time, dose, and frequency of taking this medication, including desired effects and most common side effects.   Bailey Bishop 12:13 PM 11/01/2020

## 2020-11-01 NOTE — Patient Instructions (Addendum)
EKG done today.   Labs done today. We will contact you only if your labs are abnormal.  No medication changes were made. Please continue all current medications as prescribed.  Your physician recommends that you schedule a follow-up appointment in: 3 months with Dr. Aundra Dubin.  If you have any questions or concerns before your next appointment please send [place] a message through [place] or call our office at [phone].    TO LEAVE A MESSAGE FOR THE NURSE SELECT OPTION 2, PLEASE LEAVE A MESSAGE INCLUDING: YOUR NAME DATE OF BIRTH CALL BACK NUMBER REASON FOR CALL**this is important as we prioritize the call backs  YOU WILL RECEIVE A CALL BACK THE SAME DAY AS LONG AS YOU CALL BEFORE 4:00 PM   Do the following things EVERYDAY: Weigh yourself in the morning before breakfast. Write it down and keep it in a log. Take your medicines as prescribed Eat low salt foods--Limit salt (sodium) to 2000 mg per day.  Stay as active as you can everyday Limit all fluids for the day to less than 2 liters   At the [facility], you and your health needs are our priority. As part of our continuing mission to provide you with exceptional heart care, we have created designated Provider Care Teams. These Care Teams include your primary Cardiologist (physician) and Advanced Practice Providers (APPs- Physician Assistants and Nurse Practitioners) who all work together to provide you with the care you need, when you need it.   You may see any of the following providers on your designated Care Team at your next follow up: Dr Glori Bickers Dr Haynes Kerns, NP Lyda Jester, [place] Audry Riles, PharmD   Please be sure to bring in all your medications bottles to every appointment.

## 2020-11-02 ENCOUNTER — Telehealth (HOSPITAL_COMMUNITY): Payer: Self-pay | Admitting: Pharmacy Technician

## 2020-11-02 NOTE — Telephone Encounter (Signed)
Advanced Heart Failure Patient Advocate Encounter  Patient was seen in clinic yesterday and expressed concern with Eliquis affordability. She is currently in the donut hole. Started an application for BMS assistance. Reminded the patient to bring in OOP and POI to attach to the application.  Will fax in once signatures are received.

## 2020-11-08 NOTE — Telephone Encounter (Signed)
Sent in application via fax.  Will follow up.  

## 2020-11-15 DIAGNOSIS — J4541 Moderate persistent asthma with (acute) exacerbation: Secondary | ICD-10-CM | POA: Diagnosis not present

## 2020-11-16 ENCOUNTER — Telehealth: Payer: Self-pay | Admitting: Pulmonary Disease

## 2020-11-16 NOTE — Telephone Encounter (Signed)
Patient returned call for [place] [facility] with Dr. Halford Chessman.  Patient scheduled 01/19/21 at 1000, with Dr. Halford Chessman, [facility] office.  Nothing further at this time.

## 2020-11-16 NOTE — Telephone Encounter (Signed)
ATC patient at number listed line just rang and rang will try to call again later

## 2020-11-16 NOTE — Telephone Encounter (Signed)
ATC patient, LMTCB 

## 2020-11-17 NOTE — Telephone Encounter (Signed)
Advanced Heart Failure Patient Advocate Encounter   Patient was approved to receive Eliquis from [employer]  [name]: [id number] Effective dates: 11/10/20 through 03/25/21  Called and spoke with the patient.   Charlann Boxer, CPhT

## 2020-11-21 ENCOUNTER — Other Ambulatory Visit (HOSPITAL_COMMUNITY): Payer: Self-pay | Admitting: Cardiology

## 2020-11-22 ENCOUNTER — Encounter (HOSPITAL_COMMUNITY)
Admission: RE | Admit: 2020-11-22 | Discharge: 2020-11-22 | Disposition: A | Discharge status: Home / Self Care | Payer: Medicare Other | Source: Ambulatory Visit | Attending: Nephrology | Admitting: Nephrology

## 2020-11-22 ENCOUNTER — Other Ambulatory Visit: Payer: Self-pay

## 2020-11-22 DIAGNOSIS — N184 Chronic kidney disease, stage 4 (severe): Secondary | ICD-10-CM | POA: Diagnosis not present

## 2020-11-22 LAB — POCT HEMOGLOBIN-HEMACUE: Hemoglobin: 13.3 g/dL (ref 12.0–15.0)

## 2020-11-22 MED ORDER — EPOETIN ALFA 3000 UNIT/ML IJ SOLN: 3000.0000 [IU] | Freq: Once | INTRAMUSCULAR | Status: DC

## 2020-12-08 DIAGNOSIS — Z0001 Encounter for general adult medical examination with abnormal findings: Secondary | ICD-10-CM | POA: Diagnosis not present

## 2020-12-08 DIAGNOSIS — D509 Iron deficiency anemia, unspecified: Secondary | ICD-10-CM | POA: Diagnosis not present

## 2020-12-15 ENCOUNTER — Other Ambulatory Visit (HOSPITAL_COMMUNITY): Payer: Self-pay | Admitting: Internal Medicine

## 2020-12-15 DIAGNOSIS — M159 Polyosteoarthritis, unspecified: Secondary | ICD-10-CM | POA: Diagnosis not present

## 2020-12-15 DIAGNOSIS — G4701 Insomnia due to medical condition: Secondary | ICD-10-CM | POA: Diagnosis not present

## 2020-12-15 DIAGNOSIS — Z0001 Encounter for general adult medical examination with abnormal findings: Secondary | ICD-10-CM | POA: Diagnosis not present

## 2020-12-15 DIAGNOSIS — K219 Gastro-esophageal reflux disease without esophagitis: Secondary | ICD-10-CM | POA: Diagnosis not present

## 2020-12-15 DIAGNOSIS — J4540 Moderate persistent asthma, uncomplicated: Secondary | ICD-10-CM | POA: Diagnosis not present

## 2020-12-15 DIAGNOSIS — M810 Age-related osteoporosis without current pathological fracture: Secondary | ICD-10-CM | POA: Diagnosis not present

## 2020-12-15 DIAGNOSIS — D509 Iron deficiency anemia, unspecified: Secondary | ICD-10-CM | POA: Diagnosis not present

## 2020-12-15 DIAGNOSIS — N1832 Chronic kidney disease, stage 3b: Secondary | ICD-10-CM | POA: Diagnosis not present

## 2020-12-15 DIAGNOSIS — M545 Low back pain, unspecified: Secondary | ICD-10-CM | POA: Diagnosis not present

## 2020-12-15 DIAGNOSIS — E211 Secondary hyperparathyroidism, not elsewhere classified: Secondary | ICD-10-CM | POA: Diagnosis not present

## 2020-12-15 DIAGNOSIS — I4821 Permanent atrial fibrillation: Secondary | ICD-10-CM | POA: Diagnosis not present

## 2020-12-15 DIAGNOSIS — Z23 Encounter for immunization: Secondary | ICD-10-CM | POA: Diagnosis not present

## 2020-12-19 ENCOUNTER — Ambulatory Visit: Payer: Medicare Other | Admitting: Pulmonary Disease

## 2020-12-21 ENCOUNTER — Other Ambulatory Visit: Payer: Self-pay

## 2020-12-21 ENCOUNTER — Encounter (HOSPITAL_COMMUNITY)
Admission: RE | Admit: 2020-12-21 | Discharge: 2020-12-21 | Disposition: A | Discharge status: Home / Self Care | Payer: Medicare Other | Source: Ambulatory Visit | Attending: Nephrology | Admitting: Nephrology

## 2020-12-21 ENCOUNTER — Encounter (HOSPITAL_COMMUNITY): Payer: Self-pay

## 2020-12-21 DIAGNOSIS — N184 Chronic kidney disease, stage 4 (severe): Secondary | ICD-10-CM | POA: Insufficient documentation

## 2020-12-21 LAB — CBC WITH DIFFERENTIAL/PLATELET
Abs Immature Granulocytes: 0.04 10*3/uL (ref 0.00–0.07)
Basophils Absolute: 0.1 10*3/uL (ref 0.0–0.1)
Basophils Relative: 1 %
Eosinophils Absolute: 0.2 10*3/uL (ref 0.0–0.5)
Eosinophils Relative: 2 %
HCT: 40.9 % (ref 36.0–46.0)
Hemoglobin: 13.5 g/dL (ref 12.0–15.0)
Immature Granulocytes: 0 %
Lymphocytes Relative: 31 %
Lymphs Abs: 3.1 10*3/uL (ref 0.7–4.0)
MCH: 33.9 pg (ref 26.0–34.0)
MCHC: 33 g/dL (ref 30.0–36.0)
MCV: 102.8 fL — ABNORMAL HIGH (ref 80.0–100.0)
Monocytes Absolute: 0.8 10*3/uL (ref 0.1–1.0)
Monocytes Relative: 7 %
Neutro Abs: 6 10*3/uL (ref 1.7–7.7)
Neutrophils Relative %: 59 %
Platelets: 277 10*3/uL (ref 150–400)
RBC: 3.98 MIL/uL (ref 3.87–5.11)
RDW: 12.4 % (ref 11.5–15.5)
WBC: 10.1 10*3/uL (ref 4.0–10.5)
nRBC: 0 % (ref 0.0–0.2)

## 2020-12-21 LAB — IRON AND TIBC
Iron: 86 ug/dL (ref 28–170)
Saturation Ratios: 24 % (ref 10.4–31.8)
TIBC: 364 ug/dL (ref 250–450)
UIBC: 278 ug/dL

## 2020-12-21 LAB — RENAL FUNCTION PANEL
Albumin: 4.1 g/dL (ref 3.5–5.0)
Anion gap: 10 (ref 5–15)
BUN: 32 mg/dL — ABNORMAL HIGH (ref 8–23)
CO2: 31 mmol/L (ref 22–32)
Calcium: 8.9 mg/dL (ref 8.9–10.3)
Chloride: 96 mmol/L — ABNORMAL LOW (ref 98–111)
Creatinine, Ser: 1.78 mg/dL — ABNORMAL HIGH (ref 0.44–1.00)
GFR, Estimated: 28 mL/min — ABNORMAL LOW (ref >60)
Glucose, Bld: 89 mg/dL (ref 70–99)
Phosphorus: 4.3 mg/dL (ref 2.5–4.6)
Potassium: 3.6 mmol/L (ref 3.5–5.1)
Sodium: 137 mmol/L (ref 135–145)

## 2020-12-21 LAB — PROTEIN / CREATININE RATIO, URINE
Creatinine, Urine: 49.17 mg/dL
Total Protein, Urine: <6 mg/dL

## 2020-12-21 LAB — POCT HEMOGLOBIN-HEMACUE: Hemoglobin: 13.7 g/dL (ref 12.0–15.0)

## 2020-12-21 MED ORDER — EPOETIN ALFA 3000 UNIT/ML IJ SOLN: 3000.0000 [IU] | Freq: Once | INTRAMUSCULAR | Status: DC

## 2020-12-22 ENCOUNTER — Ambulatory Visit (HOSPITAL_COMMUNITY)
Admission: RE | Admit: 2020-12-22 | Discharge: 2020-12-22 | Disposition: A | Discharge status: Home / Self Care | Payer: Medicare Other | Source: Ambulatory Visit | Attending: Internal Medicine | Admitting: Internal Medicine

## 2020-12-22 DIAGNOSIS — M810 Age-related osteoporosis without current pathological fracture: Secondary | ICD-10-CM | POA: Diagnosis not present

## 2020-12-22 LAB — PTH, INTACT AND CALCIUM
Calcium, Total (PTH): 9.3 mg/dL (ref 8.7–10.3)
PTH: 142 pg/mL — ABNORMAL HIGH (ref 15–65)

## 2021-01-05 ENCOUNTER — Other Ambulatory Visit (HOSPITAL_COMMUNITY): Payer: Self-pay | Admitting: Cardiology

## 2021-01-18 ENCOUNTER — Encounter (HOSPITAL_COMMUNITY)
Admission: RE | Admit: 2021-01-18 | Discharge: 2021-01-18 | Disposition: A | Discharge status: Home / Self Care | Payer: Medicare Other | Source: Ambulatory Visit | Attending: Nephrology | Admitting: Nephrology

## 2021-01-18 ENCOUNTER — Other Ambulatory Visit: Payer: Self-pay

## 2021-01-18 DIAGNOSIS — D631 Anemia in chronic kidney disease: Secondary | ICD-10-CM | POA: Diagnosis not present

## 2021-01-18 DIAGNOSIS — N184 Chronic kidney disease, stage 4 (severe): Secondary | ICD-10-CM | POA: Diagnosis not present

## 2021-01-18 DIAGNOSIS — I129 Hypertensive chronic kidney disease with stage 1 through stage 4 chronic kidney disease, or unspecified chronic kidney disease: Secondary | ICD-10-CM | POA: Diagnosis not present

## 2021-01-18 DIAGNOSIS — E211 Secondary hyperparathyroidism, not elsewhere classified: Secondary | ICD-10-CM | POA: Diagnosis not present

## 2021-01-18 DIAGNOSIS — I5022 Chronic systolic (congestive) heart failure: Secondary | ICD-10-CM | POA: Diagnosis not present

## 2021-01-18 LAB — RENAL FUNCTION PANEL
Albumin: 4.1 g/dL (ref 3.5–5.0)
Anion gap: 10 (ref 5–15)
BUN: 21 mg/dL (ref 8–23)
CO2: 29 mmol/L (ref 22–32)
Calcium: 9.2 mg/dL (ref 8.9–10.3)
Chloride: 100 mmol/L (ref 98–111)
Creatinine, Ser: 1.72 mg/dL — ABNORMAL HIGH (ref 0.44–1.00)
GFR, Estimated: 29 mL/min — ABNORMAL LOW (ref >60)
Glucose, Bld: 95 mg/dL (ref 70–99)
Phosphorus: 3.6 mg/dL (ref 2.5–4.6)
Potassium: 3.9 mmol/L (ref 3.5–5.1)
Sodium: 139 mmol/L (ref 135–145)

## 2021-01-18 LAB — CBC WITH DIFFERENTIAL/PLATELET
Abs Immature Granulocytes: 0.03 10*3/uL (ref 0.00–0.07)
Basophils Absolute: 0.1 10*3/uL (ref 0.0–0.1)
Basophils Relative: 1 %
Eosinophils Absolute: 1 10*3/uL — ABNORMAL HIGH (ref 0.0–0.5)
Eosinophils Relative: 12 %
HCT: 41 % (ref 36.0–46.0)
Hemoglobin: 13.3 g/dL (ref 12.0–15.0)
Immature Granulocytes: 0 %
Lymphocytes Relative: 34 %
Lymphs Abs: 3 10*3/uL (ref 0.7–4.0)
MCH: 32.8 pg (ref 26.0–34.0)
MCHC: 32.4 g/dL (ref 30.0–36.0)
MCV: 101.2 fL — ABNORMAL HIGH (ref 80.0–100.0)
Monocytes Absolute: 0.7 10*3/uL (ref 0.1–1.0)
Monocytes Relative: 8 %
Neutro Abs: 4 10*3/uL (ref 1.7–7.7)
Neutrophils Relative %: 45 %
Platelets: 271 10*3/uL (ref 150–400)
RBC: 4.05 MIL/uL (ref 3.87–5.11)
RDW: 12.7 % (ref 11.5–15.5)
WBC: 8.8 10*3/uL (ref 4.0–10.5)
nRBC: 0 % (ref 0.0–0.2)

## 2021-01-18 LAB — PROTEIN / CREATININE RATIO, URINE
Creatinine, Urine: 34.6 mg/dL
Total Protein, Urine: <6 mg/dL

## 2021-01-18 LAB — IRON AND TIBC
Iron: 67 ug/dL (ref 28–170)
Saturation Ratios: 17 % (ref 10.4–31.8)
TIBC: 383 ug/dL (ref 250–450)
UIBC: 316 ug/dL

## 2021-01-18 LAB — POCT HEMOGLOBIN-HEMACUE: Hemoglobin: 13.5 g/dL (ref 12.0–15.0)

## 2021-01-18 MED ORDER — EPOETIN ALFA 3000 UNIT/ML IJ SOLN: 3000.0000 [IU] | Freq: Once | INTRAMUSCULAR | Status: DC

## 2021-01-19 ENCOUNTER — Ambulatory Visit: Payer: Medicare Other | Admitting: Pulmonary Disease

## 2021-01-19 LAB — PTH, INTACT AND CALCIUM
Calcium, Total (PTH): 9.3 mg/dL (ref 8.7–10.3)
PTH: 157 pg/mL — ABNORMAL HIGH (ref 15–65)

## 2021-01-31 ENCOUNTER — Encounter (HOSPITAL_COMMUNITY): Payer: Self-pay | Admitting: Cardiology

## 2021-01-31 ENCOUNTER — Other Ambulatory Visit: Payer: Self-pay

## 2021-01-31 ENCOUNTER — Ambulatory Visit (HOSPITAL_COMMUNITY)
Admission: RE | Admit: 2021-01-31 | Discharge: 2021-01-31 | Disposition: A | Discharge status: Home / Self Care | Payer: Medicare Other | Source: Ambulatory Visit | Attending: Cardiology | Admitting: Cardiology

## 2021-01-31 VITALS — BP 132/70 | HR 94 | Wt 124.2 lb

## 2021-01-31 DIAGNOSIS — Z87891 Personal history of nicotine dependence: Secondary | ICD-10-CM | POA: Insufficient documentation

## 2021-01-31 DIAGNOSIS — R0602 Shortness of breath: Secondary | ICD-10-CM | POA: Diagnosis not present

## 2021-01-31 DIAGNOSIS — D631 Anemia in chronic kidney disease: Secondary | ICD-10-CM | POA: Diagnosis not present

## 2021-01-31 DIAGNOSIS — I4821 Permanent atrial fibrillation: Secondary | ICD-10-CM

## 2021-01-31 DIAGNOSIS — Z9289 Personal history of other medical treatment: Secondary | ICD-10-CM | POA: Insufficient documentation

## 2021-01-31 DIAGNOSIS — I43 Cardiomyopathy in diseases classified elsewhere: Secondary | ICD-10-CM | POA: Insufficient documentation

## 2021-01-31 DIAGNOSIS — I5042 Chronic combined systolic (congestive) and diastolic (congestive) heart failure: Secondary | ICD-10-CM | POA: Insufficient documentation

## 2021-01-31 DIAGNOSIS — Z79899 Other long term (current) drug therapy: Secondary | ICD-10-CM | POA: Insufficient documentation

## 2021-01-31 DIAGNOSIS — Z7901 Long term (current) use of anticoagulants: Secondary | ICD-10-CM | POA: Insufficient documentation

## 2021-01-31 DIAGNOSIS — I130 Hypertensive heart and chronic kidney disease with heart failure and stage 1 through stage 4 chronic kidney disease, or unspecified chronic kidney disease: Secondary | ICD-10-CM | POA: Insufficient documentation

## 2021-01-31 DIAGNOSIS — I482 Chronic atrial fibrillation, unspecified: Secondary | ICD-10-CM | POA: Insufficient documentation

## 2021-01-31 DIAGNOSIS — Z09 Encounter for follow-up examination after completed treatment for conditions other than malignant neoplasm: Secondary | ICD-10-CM | POA: Insufficient documentation

## 2021-01-31 DIAGNOSIS — I5032 Chronic diastolic (congestive) heart failure: Secondary | ICD-10-CM | POA: Diagnosis not present

## 2021-01-31 DIAGNOSIS — I5022 Chronic systolic (congestive) heart failure: Secondary | ICD-10-CM | POA: Diagnosis not present

## 2021-01-31 DIAGNOSIS — N184 Chronic kidney disease, stage 4 (severe): Secondary | ICD-10-CM | POA: Insufficient documentation

## 2021-01-31 DIAGNOSIS — I272 Pulmonary hypertension, unspecified: Secondary | ICD-10-CM | POA: Diagnosis not present

## 2021-01-31 LAB — BASIC METABOLIC PANEL
Anion gap: 11 (ref 5–15)
BUN: 23 mg/dL (ref 8–23)
CO2: 28 mmol/L (ref 22–32)
Calcium: 9.4 mg/dL (ref 8.9–10.3)
Chloride: 99 mmol/L (ref 98–111)
Creatinine, Ser: 1.94 mg/dL — ABNORMAL HIGH (ref 0.44–1.00)
GFR, Estimated: 25 mL/min — ABNORMAL LOW (ref >60)
Glucose, Bld: 102 mg/dL — ABNORMAL HIGH (ref 70–99)
Potassium: 4 mmol/L (ref 3.5–5.1)
Sodium: 138 mmol/L (ref 135–145)

## 2021-01-31 NOTE — Patient Instructions (Addendum)
No Labs done today.   No medication changes were made. Please continue all current medications as prescribed.  Your provider has requested that you have a PYP Scan done. This has to be approved through your insurance company prior to scheduling, once approved we will contact you to schedule an appointment.  Your physician recommends that you schedule a follow-up appointment in: 4 months  If you have any questions or concerns before your next appointment please send [place] a message through [place] or call our office at [phone].    TO LEAVE A MESSAGE FOR THE NURSE SELECT OPTION 2, PLEASE LEAVE A MESSAGE INCLUDING: YOUR NAME DATE OF BIRTH CALL BACK NUMBER REASON FOR CALL**this is important as we prioritize the call backs  YOU WILL RECEIVE A CALL BACK THE SAME DAY AS LONG AS YOU CALL BEFORE 4:00 PM   Do the following things EVERYDAY: Weigh yourself in the morning before breakfast. Write it down and keep it in a log. Take your medicines as prescribed Eat low salt foods--Limit salt (sodium) to 2000 mg per day.  Stay as active as you can everyday Limit all fluids for the day to less than 2 liters   At the [facility], you and your health needs are our priority. As part of our continuing mission to provide you with exceptional heart care, we have created designated Provider Care Teams. These Care Teams include your primary Cardiologist (physician) and Advanced Practice Providers (APPs- Physician Assistants and Nurse Practitioners) who all work together to provide you with the care you need, when you need it.   You may see any of the following providers on your designated Care Team at your next follow up: Dr Glori Bickers Dr Haynes Kerns, NP Lyda Jester, [place] Audry Riles, PharmD   Please be sure to bring in all your medications bottles to every appointment.

## 2021-02-01 ENCOUNTER — Encounter (HOSPITAL_COMMUNITY): Payer: Medicare Other

## 2021-02-01 NOTE — Progress Notes (Signed)
PCP: [name], MD HF Cardiology: Dr. Aundra Dubin  80 y.o. with history of CKD 4, chronic atrial fibrillation, anemia, and chronic systolic CHF was referred initially by [name] for evaluation of CHF.  Patient has a complex past history.  She has a long history of atrial fibrillation, now chronic as she has failed DCCV and Tikosyn and did not tolerate amiodarone.  Her last echo in 6/21 showed EF 35-40% with mild RV dysfunction, large pericardial effusion without tamponade, moderate MR, moderate TR.  Cause of cardiomyopathy is not certain.    She has been admitted multiple times to [facility]. She feels like hospitalizations have not helped her shortness of breath.  She was admitted to [name] 10/29/19.  She was found to be anemic with hgb 6.4, FOBT+.  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  Hgb was 7.5 at time of discharge.  Eliquis was helped for several days, she was told to restart it on 11/04/19.    She was admitted again in 8/21 to [facility] with CHF exacerbation.  RHC in 8/21 showed elevated R>L filling pressures with mild pulmonary hypertension and low PVR. PAPi was low suggesting significant RV dysfunction.  Echo done today and reviewed showed EF 50-55% with septal hypokinesis, mild RV enlargement with mildly decreased RV systolic function, PASP 44 mmHg, moderate MR with 2 eccentric jets, moderate-severe TR, moderate lateral pericardial effusion (improved from prior echo).   PYP scan in 11/21 was grade 2 with H/CL 1.36. Negative myeloma panel and urine immunofixation. [employer] gene testing was negative for TTR mutations.   Echo in 2/22 showed EF 60-65%, normal RV, moderate biatrial enlargement, moderate MR, mild AS.   She returns today for followup of CHF.  Weight stable.  She is short of breath walking up a flight of stairs or walking 200-300 feet.  No chest pain. No lightheadedness.  No orthopnea/PND.  No BRBPR/melena.    Labs (8/21): K 3.9, creatinine 2.35, hgb 7.5, FOBT+ Labs  (9/21): K 3.7, creatinine 2.44 Labs (10/21): hgb 11.9, K 3.5, creatinine 1.97 Las (11/21): hgb 12.3 Labs (12/21): K 3.6, creatinine 2.04 Labs (1/22): hgb 12.5 Labs (5/22): K 4, creatinine 1.9, hgb 12.8 Labs (10/22): K 3.9, creatinine 1.72, hgb 13.3  PMH: 1. CKD stage 4 2. Chronic atrial fibrillation: Failed DCCV in 11/18.  - Failed Tikosyn in 9/19.  - Intolerant of calcium channel blockers.  - Intolerant of amiodarone.  3. HTN 4. Asthma 5. GERD 6. Chronic systolic => diastolic CHF: Uncertain etiology.  - Echo (6/21): EF 35-40%, global hypokinesis, mildly decreased RV systolic function with mild RV dilation, biatrial enlargement, large pericardial effusion without tamponade, moderate MR, moderate TR, IVC dilated.  - RHC (8/21): mean RA 18, PA 35/18, mean PCWP 16, CI 3.6, PVR 1.5 WU, PAPi 0.9 => primarily RV failure - V/Q scan (8/21): Not suggestive of chronic PEs - Echo (10/21): EF 50-55% with septal hypokinesis, mild RV enlargement with mildly decreased RV systolic function, PASP 44 mmHg, moderate MR with 2 eccentric jets, moderate-severe TR, moderate lateral pericardial effusion (improved from prior echo)  - PYP scan (11/21): grade 2 with H/CL 1.36.  Myeloma panel negative, urine immunofixation negative. [employer] gene testing was negative for TTR mutations.  - Echo (2/22): EF 60-65%, normal RV, moderate biatrial enlargement, moderate MR, mild AS. 7. Anemia of chronic renal disease.  8. Breast cancer: Bilateral mastectomy.  9. GI bleeding: Admission in 8/21. EGD/c-scope in 6/21 negative.  10. Aortic stenosis: Mild  on 2/22 echo.  11. Osteoporosis 12. COPD: History of prior smoking.  PFTs in 12/21 with mild obstruction.   Social History   Socioeconomic History   Marital status: Married    Spouse name: [name]   Number of children: 1   Years of education: Not on file   Highest education level: Not on file  Occupational History   Occupation: retired  Tobacco Use   Smoking status:  Former    Packs/day: 1.00    Years: 28.00    Pack years: 28.00    Types: Cigarettes    Quit date: 07/17/1983    Years since quitting: 37.5   Smokeless tobacco: Never  Vaping Use   Vaping Use: Never used  Substance and Sexual Activity   Alcohol use: No   Drug use: No   Sexual activity: Not on file  Other Topics Concern   Not on file  Social History Narrative   Not on file   Social Determinants of Health   Financial Resource Strain: Not on file  Food Insecurity: Not on file  Transportation Needs: Not on file  Physical Activity: Not on file  Stress: Not on file  Social Connections: Not on file  Intimate Partner Violence: Not on file   Family History  Problem Relation Age of Onset   Cancer Mother    Aneurysm Father    Cancer Sister    Cancer Sister    Anesthesia problems Neg Hx    Hypotension Neg Hx    Malignant hyperthermia Neg Hx    Pseudochol deficiency Neg Hx    Colon cancer Neg Hx    ROS: All systems reviewed and negative except as per HPI.  Current Outpatient Medications  Medication Sig Dispense Refill   acetaminophen (TYLENOL) 650 MG CR tablet Take 650 mg by mouth every 8 (eight) hours as needed for pain.     albuterol (VENTOLIN HFA) 108 (90 Base) MCG/ACT inhaler Inhale 1-2 puffs into the lungs every 6 (six) hours as needed for wheezing or shortness of breath. 18 g 3   ALPRAZolam (XANAX) 0.5 MG tablet Take 0.5 mg by mouth at bedtime.     apixaban (ELIQUIS) 2.5 MG TABS tablet Take 1 tablet (2.5 mg total) by mouth 2 (two) times daily. 60 tablet 11   bisoprolol (ZEBETA) 5 MG tablet TAKE 0.5 TABLETS (2.5MG  TOTAL) BY MOUTH DAILY 45 tablet 3   budesonide-formoterol (SYMBICORT) 80-4.5 MCG/ACT inhaler Inhale 2 puffs into the lungs as needed.     Calcitriol (ROCALTROL PO) Take 1 capsule by mouth 3 (three) times a week.     fluticasone (FLONASE) 50 MCG/ACT nasal spray Place 2 sprays into both nostrils as needed for allergies or rhinitis.     potassium chloride SA  (KLOR-CON) 20 MEQ tablet Take 2 tablets (40 mEq total) by mouth daily. 180 tablet 3   torsemide (DEMADEX) 20 MG tablet Take 3 tablets (60 mg total) by mouth 2 (two) times daily. 540 tablet 3   No current facility-administered medications for this encounter.   BP 132/70   Pulse 94   Wt 56.3 kg (124 lb 3.2 oz)   SpO2 97%   BMI 21.32 kg/m  General: NAD Neck: No JVD, no thyromegaly or thyroid nodule.  Lungs: Wheezes bilaterally CV: Nondisplaced PMI.  Heart irregular S1/S2, no S3/S4, 1/6 SEM RUSB.  No peripheral edema.  No carotid bruit.  Normal pedal pulses.  Abdomen: Soft, nontender, no hepatosplenomegaly, no distention.  Skin: Intact without lesions or rashes.  Neurologic: Alert and oriented x 3.  Psych: Normal affect. Extremities: No clubbing or cyanosis.  HEENT: Normal.   Assessment/Plan: 1. Chronic systolic => diastolic CHF:  Prominent RV failure.  Echo in 6/21 with EF 35-40% with mild RV dysfunction, large pericardial effusion without tamponade, moderate MR, moderate TR.  RHC in 8/21 with preserved cardiac output, primarily RV failure, mild pulmonary hypertension with low PVR. V/Q scan in 8/21 not suggestive of chronic PEs.  Echo in 10/21 with EF 50-55% with septal hypokinesis, mild RV enlargement with mildly decreased RV systolic function, PASP 44 mmHg, moderate MR with 2 eccentric jets, moderate-severe TR, moderate lateral pericardial effusion (improved from prior echo).  PYP scan was equivocal for TTR cardiac amyloidosis (grade 2 but H/CL < 1.5), [employer] gene testing was negative for TTR mutations.  Echo in 2/22 showed EF 60-65%, normal RV, moderate biatrial enlargement, moderate MR, mild AS, no pericardial effusion.  Cause of cardiomyopathy is not certain. No chest pain.  She looks near-euvolemic.  NYHA class II symptoms.  - Continue torsemide 60 mg bid.  - She does not want to take SGLT2 inhibitor due to UTI risk.     - Continue KCl 40 daily.    - Continue bisoprolol 2.5 mg daily.   - No digoxin, Entresto, spironolactone with improved LV EF and CKD stage IV.  - With equivocal PYP scan 1 year ago, I am going to arrange for repeat PYP scan (?TTR cardiac amyloidosis).  2. Atrial fibrillation: Chronic, has failed DCCV and Tikosyn.  She has not tolerated amiodarone.  Rate control good on bisoprolol 2.5 mg daily.  - Continue bisoprolol 2.5 daily.  - Continue Eliquis 2.5 mg bid.   3. Anemia: Multifactorial, suspect due to renal disease as well as GI bleeding.  Hgb down to 6.5 in 8/21 in setting of GI bleeding with FOBT+.  Hgb up to 13.3 in 10/22.    - No blood products ([employer]).  - Following with hematology for Aranesp/feraheme.  4. CKD: Stage IV.  Recent creatinine 1.72.  5. Pericardial effusion: Large on 6/21 echo without tamponade, echo in 10/21 showed pericardial effusion moderate laterally (smaller).  Echo in 2/22 showed resolution of pericardial effusion.   Followup in 4 months   [name] 02/01/2021

## 2021-02-02 ENCOUNTER — Telehealth (HOSPITAL_COMMUNITY): Payer: Self-pay | Admitting: *Deleted

## 2021-02-06 ENCOUNTER — Encounter (HOSPITAL_COMMUNITY): Payer: Medicare Other

## 2021-02-08 DIAGNOSIS — M810 Age-related osteoporosis without current pathological fracture: Secondary | ICD-10-CM | POA: Diagnosis not present

## 2021-02-08 DIAGNOSIS — J019 Acute sinusitis, unspecified: Secondary | ICD-10-CM | POA: Diagnosis not present

## 2021-02-15 ENCOUNTER — Encounter (HOSPITAL_COMMUNITY)
Admission: RE | Admit: 2021-02-15 | Discharge: 2021-02-15 | Disposition: A | Discharge status: Home / Self Care | Payer: Medicare Other | Source: Ambulatory Visit | Attending: Nephrology | Admitting: Nephrology

## 2021-02-15 DIAGNOSIS — D631 Anemia in chronic kidney disease: Secondary | ICD-10-CM | POA: Diagnosis not present

## 2021-02-15 DIAGNOSIS — N184 Chronic kidney disease, stage 4 (severe): Secondary | ICD-10-CM | POA: Insufficient documentation

## 2021-02-15 LAB — POCT HEMOGLOBIN-HEMACUE: Hemoglobin: 14.5 g/dL (ref 12.0–15.0)

## 2021-02-15 MED ORDER — ROMOSOZUMAB-AQQG 105 MG/1.17ML ~~LOC~~ SOSY
210.0000 mg | PREFILLED_SYRINGE | Freq: Once | SUBCUTANEOUS | Status: DC
  Filled 2021-02-15: qty 2.4

## 2021-02-15 NOTE — Progress Notes (Signed)
Pt here for Hgb check and procrit injection if needed. Pt states that she is not coming every month for her Hgb check. She will come and have her blood work done before her appt with Dr Theador Hawthorne on 04/26/2021. Thayer Headings at [facility] office notified. No further orders or instructions given.  Pt also states that she is not going to be taking evenity or prolia as ordered per Dr Nevada Crane. Pt states that her cardiologist advised her not to take these meds. Dr Juel Burrow nurse notified. Voiced understanding.

## 2021-02-20 ENCOUNTER — Other Ambulatory Visit: Payer: Self-pay

## 2021-02-20 ENCOUNTER — Encounter (HOSPITAL_COMMUNITY)
Admission: RE | Admit: 2021-02-20 | Discharge: 2021-02-20 | Disposition: A | Discharge status: Home / Self Care | Payer: Medicare Other | Source: Ambulatory Visit | Attending: Cardiology | Admitting: Cardiology

## 2021-02-20 DIAGNOSIS — Z9289 Personal history of other medical treatment: Secondary | ICD-10-CM | POA: Diagnosis not present

## 2021-02-20 DIAGNOSIS — E854 Organ-limited amyloidosis: Secondary | ICD-10-CM | POA: Diagnosis not present

## 2021-02-20 MED ORDER — TECHNETIUM TC 99M PYROPHOSPHATE
21.3000 | Freq: Once | INTRAVENOUS | Status: AC | PRN
  Administered 2021-02-20: 11:00:00 21.3 via INTRAVENOUS
  Filled 2021-02-20: qty 22

## 2021-02-23 ENCOUNTER — Telehealth (HOSPITAL_COMMUNITY): Payer: Self-pay | Admitting: Pharmacy Technician

## 2021-02-23 NOTE — Telephone Encounter (Signed)
Advanced Heart Failure Patient Advocate Encounter  Patient's husband left a message regarding Eliquis assistance for 2023. Called and spoke with the patient's husband. He is aware that we will not reapply with [employer] until they have spent the 3% OOP requirement first. Gave him the option of calling me back when they get their 2023 social security statements. I can give him the 3% amount based off of those statements. He was appreciative of the suggestion.  Charlann Boxer, CPhT

## 2021-02-27 ENCOUNTER — Ambulatory Visit: Payer: Medicare Other | Admitting: Allergy & Immunology

## 2021-02-27 ENCOUNTER — Other Ambulatory Visit: Payer: Self-pay

## 2021-02-27 ENCOUNTER — Encounter: Payer: Self-pay | Admitting: Allergy & Immunology

## 2021-02-27 ENCOUNTER — Telehealth: Payer: Self-pay

## 2021-02-27 VITALS — BP 122/78 | HR 84 | Temp 97.7°F | Resp 16 | Ht 62.0 in | Wt 128.6 lb

## 2021-02-27 DIAGNOSIS — J310 Chronic rhinitis: Secondary | ICD-10-CM | POA: Diagnosis not present

## 2021-02-27 DIAGNOSIS — R0602 Shortness of breath: Secondary | ICD-10-CM | POA: Diagnosis not present

## 2021-02-27 MED ORDER — IPRATROPIUM BROMIDE 0.03 % NA SOLN: 2.0000 | Freq: Four times a day (QID) | NASAL | 5 refills | Status: DC

## 2021-02-27 MED ORDER — BUDESONIDE 0.5 MG/2ML IN SUSP: 0.5000 mg | Freq: Two times a day (BID) | RESPIRATORY_TRACT | 5 refills | Status: DC

## 2021-02-27 MED ORDER — LORATADINE 10 MG PO TABS: 10.0000 mg | ORAL_TABLET | ORAL | 5 refills | Status: DC

## 2021-02-27 NOTE — Progress Notes (Signed)
NEW PATIENT  Date of Service/Encounter:  02/27/21  Consult requested by: [name], MD   Assessment:   SOB (shortness of breath) - with a recent absolute eosinophil count of 1000 in October 2022 (consider Fasenra at the next visit)  Previous smoker  Chronic rhinitis - sending labs due to poor pulmonary status  Congestive heart failure  Stage III chronic kidney disease  History of breast cancer - followed by [name]  History of normocytic anemia - followed by [name]   Ms. He presents for an evaluation of environmental allergies.  On exam, she is wheezing and her spirometry is rather horrible.  Her FVC did improve slightly with the use of Xopenex, but did not normalize by any means.  At this point, it is hard to tell whether her shortness of breath and wheezing is due to her congestive heart failure or her asthma.  It should be noted that 1 year ago, her lung testing was fairly normal when Dr. Halford Chessman ordered it.  I think she would benefit from regular use of her Symbicort.  This was at least help [place] to delineate whether her wheezing and shortness of breath is secondary to asthma.  However, it is prohibitively expensive for her.  I wanted to start both Pulmicort and formoterol via the nebulizer twice daily, she was worried about the price.  Therefore we will start with Pulmicort twice daily and go from there.  When she realizes this might be more affordable, we might be able to add on the formoterol.  She does have an absolute eosinophil count of 1000.  It is ranged from 200 up to 1300 in the last 6 months.  This certainly is not in the hypereosinophilic syndrome range, but this is something to keep an eye on.  We are going to get some labs to look for serious causes of asthma.   We are going to get some lab work to see if she has any evidence of environmental allergies.  Given her age, I doubt that anything is going to come back extremely high.  We are going to  start her on nasal Atrovent to see if this can help with the postnasal drip.  Plan/Recommendations:    1. Chronic rhinitis - We are going to send blood work since your lung testing was so poor today (and therefore skin testing could be dangerous). - We will call you in 1-2 weeks with the results of the testing. - In the interim, start ipratropium one spray per nostril every 6 hours as needed (start with one spray daily to see how this works, but you can increase this if needed).  - Try the nose spray first, but if this is not controlling it, you can do CLARITIN (loratadine) 10mg  EVERY 48 hours.   2. SOB (shortness of breath) - Lung testing looks terrible today, but this might be indicative of your congestive heart failure. - I think you need something every day to help control your inflammation. - Start Pulmicort (budesonide) 0.5mg  twice daily to see if this helps with your shortness of breath.  - Continue with albuterol nebulizer treatments every 4-6 hours as needed.  3. Return in about 6 weeks (around 04/10/2021).    This note in its entirety was forwarded to the Provider who requested this consultation.  Subjective:   Bailey Bishop is a 80 y.o. female presenting today for evaluation of  Chief Complaint  Patient presents with   Allergy Testing  Patient has bad allergies, says she has kidney disease and CHF and most OTC medications she is not able to take due to her kidney disease. States her eyes are always runny and same with her nose. She is not sure what she is allergic to. It is year round    Bailey Bishop has a history of the following: Patient Active Problem List   Diagnosis Date Noted   Normocytic anemia 11/26/2019   Acute on chronic systolic heart failure, NYHA class 3 ([place]) 11/04/2019   Fever 10/29/2019   Acute blood loss anemia 10/29/2019   Acute on chronic diastolic HF (heart failure) ([place]) 09/18/2019   GI bleed 09/05/2019   Symptomatic anemia  09/03/2019   CKD (chronic kidney disease), stage IIIb 09/03/2019   Iron deficiency anemia    Benign neoplasm of transverse colon    Diverticulosis of large intestine without diverticulitis    Visit for monitoring Tikosyn therapy 12/02/2017   Atrial fibrillation ([place]) 03/31/2017   Persistent atrial fibrillation ([place])    Anticoagulated 10/03/2016   COPD (chronic obstructive pulmonary disease) ([place]) 10/03/2016   Hypokalemia 09/24/2016   Asthma in adult without complication 62/56/3893   Hyperglycemia 09/23/2016   Anxiety    GERD (gastroesophageal reflux disease)    Shortness of breath    Essential hypertension    Status asthmaticus 07/07/2014    History obtained from: chart review and patient.  Bailey Bishop was referred by [name], MD.     Cardiologist: Dr. Aundra Dubin Nephrologist: Dr. Theador Hawthorne  Pulmonologist: Dr. Halford Chessman  Hematologist/oncologist: - Dr. Ashani Pumphrey is a 80 y.o. female presenting for an evaluation of shortness of breath and environmental allergies .   Asthma/Respiratory Symptom History: She has a diagnosis of asthma. This was made by Dr. Halford Chessman. She has Symbicort on board, but she does not use that routinely. She has an albuterol nebulizer only as needed.  She has not used her nebulizer in around one month. She did have prednisone within the last month. She does not use prednisone "very often". She typically gets a "light dose". She has had it twice in 2022. She reports that her Symbicort is $104 per month. It should be around $50 when she is not in the donut hole.   She last saw Dr. Halford Chessman one year ago. She was on Dulera two puffs BID at the time. She was also on montelukast as well, but she stopped that because she did not feel that it was doing anything useful. She had PFTs performed in December 2021 that showed mild restriction and mild obstruction.  Full results below:      Allergic Rhinitis Symptom History: She has nose that is pouring water and nasal  congestion. She  has eyes that pour water. This all "ends up in [her] chest". She does not take anything for her because every time she picks it up, it says not to take it due to kidney disease. She wants something that will not interact with her heart disease or her kidney disease. She has had allergy testing "several years ago". She was allergic to pine but she does not remember what else was positive. This was back in the 1970s. 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 She was there for 15 years. 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 Her husband did not want her to work evidently.  She has a history of CHF and is followed by Dr. Aundra Dubin. She had a nuclear study done last week that is still pending. She describes a cath procedure but again she does not remember the results.   GERD Symptom History: She reports GERD from tomatoes. She is on omeprazole, but she has learned what to avoid from a diet perspective such that she does not need to omeprazole.   Otherwise, there is no history of other atopic diseases, including food allergies, drug allergies, stinging insect allergies, eczema, urticaria, or contact dermatitis. There is no significant infectious history. Vaccinations are up to date.    Past Medical History: Patient Active Problem List   Diagnosis Date Noted   Normocytic anemia 11/26/2019   Acute on chronic systolic heart failure, NYHA class 3 ([place]) 11/04/2019   Fever 10/29/2019   Acute blood loss anemia 10/29/2019   Acute on chronic diastolic HF (heart failure) ([place]) 09/18/2019   GI bleed 09/05/2019   Symptomatic anemia 09/03/2019   CKD (chronic kidney disease), stage IIIb 09/03/2019   Iron deficiency anemia    Benign neoplasm of transverse colon    Diverticulosis of large intestine without diverticulitis    Visit for monitoring Tikosyn therapy 12/02/2017   Atrial fibrillation ([place]) 03/31/2017    Persistent atrial fibrillation ([place])    Anticoagulated 10/03/2016   COPD (chronic obstructive pulmonary disease) ([place]) 10/03/2016   Hypokalemia 09/24/2016   Asthma in adult without complication 01/17/8526   Hyperglycemia 09/23/2016   Anxiety    GERD (gastroesophageal reflux disease)    Shortness of breath    Essential hypertension    Status asthmaticus 07/07/2014    Medication List:  Allergies as of 02/27/2021       Reactions   Feraheme [ferumoxytol] Shortness Of Breath   Cardizem Cd [diltiazem Hcl Er Beads] Diarrhea   Amiodarone Nausea And Vomiting   Metoprolol Tartrate Diarrhea   Sulfa Antibiotics Nausea And Vomiting   Tomato Other (See Comments)   Burns stomach   Carvedilol Diarrhea, Rash   Chocolate Rash, Other (See Comments)   Dark chocolate        Medication List        Accurate as of February 27, 2021 12:13 PM. If you have any questions, ask your nurse or doctor.          acetaminophen 650 MG CR tablet Commonly known as: TYLENOL Take 650 mg by mouth every 8 (eight) hours as needed for pain.   albuterol 108 (90 Base) MCG/ACT inhaler Commonly known as: VENTOLIN HFA Inhale 1-2 puffs into the lungs every 6 (six) hours as needed for wheezing or shortness of breath.   ALPRAZolam 0.5 MG tablet Commonly known as: XANAX Take 0.5 mg by mouth at bedtime.   apixaban 2.5 MG Tabs tablet Commonly known as: ELIQUIS Take 1 tablet (2.5 mg total) by mouth 2 (two) times daily.   bisoprolol 5 MG tablet Commonly known as: ZEBETA TAKE 0.5 TABLETS (2.5MG  TOTAL) BY MOUTH DAILY   budesonide 0.5 MG/2ML nebulizer solution Commonly known as: Pulmicort Take 2 mLs (0.5 mg total) by nebulization 2 (two) times daily. Started by: Valentina Shaggy, MD   budesonide-formoterol 80-4.5 MCG/ACT inhaler Commonly known as: SYMBICORT Inhale 2 puffs into the lungs as needed.   fluticasone 50 MCG/ACT nasal spray Commonly known as: FLONASE Place 2 sprays into both nostrils as  needed for allergies or rhinitis.   ipratropium 0.03 % nasal spray Commonly known as: ATROVENT Place 2 sprays into both nostrils in  the morning, at noon, in the evening, and at bedtime. Started by: Valentina Shaggy, MD   loratadine 10 MG tablet Commonly known as: Claritin Take 1 tablet (10 mg total) by mouth every other day. Started by: Valentina Shaggy, MD   potassium chloride SA 20 MEQ tablet Commonly known as: KLOR-CON M Take 2 tablets (40 mEq total) by mouth daily.   ROCALTROL PO Take 1 capsule by mouth 3 (three) times a week.   torsemide 20 MG tablet Commonly known as: DEMADEX Take 3 tablets (60 mg total) by mouth 2 (two) times daily.        Birth History: non-contributory  Developmental History: non-contributory  Past Surgical History: Past Surgical History:  Procedure Laterality Date   BIOPSY N/A 04/22/2018   Procedure: BIOPSY;  Surgeon: Aviva Signs, MD;  [facility];  Service: Gastroenterology;  Laterality: N/A;   BIOPSY  09/06/2019   Procedure: BIOPSY;  Surgeon: Daneil Dolin, MD;  [facility];  Service: Endoscopy;;  gastric   CARDIOVERSION N/A 02/18/2017   Procedure: CARDIOVERSION;  Surgeon: Josue Hector, MD;  Location[facility] END[facility];  Service: Cardiovascular;  Laterality: N/A;   CARDIOVERSION N/A 12/04/2017   Procedure: CARDIOVERSION;  Surgeon: [name], MD;  Location: [place];  Service: Cardiovascular;  Laterality: N/A;   CATARACT EXTRACTION W/PHACO  07/23/2011   Procedure: CATARACT EXTRACTION PHACO AND INTRAOCULAR LENS PLACEMENT ([place]);  Surgeon: Williams Che, MD;  Location: [facility];  Service: Ophthalmology;  Laterality: Right;  CDE:9.96   CATARACT EXTRACTION W/PHACO Left 11/13/2018   Procedure: CATARACT EXTRACTION PHACO AND INTRAOCULAR LENS PLACEMENT (IOC);  Surgeon: Baruch Goldmann, MD;  Location: [facility];  Service: Ophthalmology;  Laterality: Left;  left, CDE: 7.44   CHOLECYSTECTOMY N/A 02/14/2015    Procedure: LAPAROSCOPIC CHOLECYSTECTOMY;  Surgeon: Aviva Signs, MD;  Location: [facility];  Service: General;  Laterality: N/A;   COLONOSCOPY N/A 04/22/2018   Procedure: COLONOSCOPY;  Surgeon: Aviva Signs, MD;  [facility];  Service: Gastroenterology;  Laterality: N/A;   COLONOSCOPY WITH PROPOFOL N/A 09/06/2019   Procedure: COLONOSCOPY WITH PROPOFOL;  Surgeon: Daneil Dolin, MD;  [facility];  Service: Endoscopy;  Laterality: N/A;   ESOPHAGEAL DILATION  09/06/2019   Procedure: ESOPHAGEAL DILATION;  Surgeon: Daneil Dolin, MD;  [facility];  Service: Endoscopy;;   ESOPHAGOGASTRODUODENOSCOPY N/A 04/22/2018   Procedure: ESOPHAGOGASTRODUODENOSCOPY (EGD);  Surgeon: Aviva Signs, MD;  [facility];  Service: Gastroenterology;  Laterality: N/A;   ESOPHAGOGASTRODUODENOSCOPY (EGD) WITH PROPOFOL N/A 09/06/2019   Procedure: ESOPHAGOGASTRODUODENOSCOPY (EGD) WITH PROPOFOL;  Surgeon: Daneil Dolin, MD;  [facility];  Service: Endoscopy;  Laterality: N/A;   GIVENS CAPSULE STUDY N/A 10/31/2019   Procedure: GIVENS CAPSULE STUDY;  Surgeon: Harvel Quale, MD;  [facility];  Service: Gastroenterology;  Laterality: N/A;   MASTECTOMY     bilateral mastectomy-right breast cancer-left taken by choice   POLYPECTOMY  04/22/2018   Procedure: POLYPECTOMY;  Surgeon: Aviva Signs, MD;  [facility];  Service: Gastroenterology;;   POLYPECTOMY  09/06/2019   Procedure: POLYPECTOMY;  Surgeon: Daneil Dolin, MD;  [facility];  Service: Endoscopy;;  cecal, ascending, hepatic flexure, sigmoid   RIGHT HEART CATH N/A 11/09/2019   Procedure: RIGHT HEART CATH;  Surgeon: Jolaine Artist, MD;  Location: [place] [facility];  Service: Cardiovascular;  Laterality: N/A;   SEPTOPLASTY  1995     Family History: Family  History  Problem Relation Age of Onset   Cancer Mother    Aneurysm Father    Cancer Sister    Cancer Sister     Anesthesia problems Neg Hx    Hypotension Neg Hx    Malignant hyperthermia Neg Hx    Pseudochol deficiency Neg Hx    Colon cancer Neg Hx      Social History: Lexys lives at home with her husband. She has one son who lives next door.  Her son is widowed.  Her daughter-in-law committed suicide several years ago.  They live in a house that is 82 years old.  There is wood flooring throughout the home.  She has gas heating and central cooling.  There is 1 dog inside of the home.  There are dust mite covers on the bed as well as the pillows.  There is no tobacco exposure.  She is not exposed to fumes, chemicals, or dust.  She does use a HEPA filter in the home.  There is no tobacco exposure.   Review of Systems  Constitutional: Negative.  Negative for chills, fever, malaise/fatigue and weight loss.  HENT:  Negative for congestion, ear discharge and ear pain.        Positive for intense rhinorrhea.  Eyes:  Negative for pain, discharge and redness.  Respiratory:  Positive for shortness of breath and wheezing. Negative for cough and sputum production.   Cardiovascular: Negative.  Negative for chest pain and palpitations.  Gastrointestinal:  Negative for abdominal pain, constipation, diarrhea, heartburn, nausea and vomiting.  Skin: Negative.  Negative for itching and rash.  Neurological:  Negative for dizziness and headaches.  Endo/Heme/Allergies:  Negative for environmental allergies. Does not bruise/bleed easily.      Objective:   Blood pressure 122/78, pulse 84, temperature 97.7 F (36.5 C), temperature source Temporal, resp. rate 16, height 5\' 2"  (1.575 m), weight 128 lb 9.6 oz (58.3 kg), SpO2 99 %. Body mass index is 23.52 kg/m.   Physical Exam:   Physical Exam Vitals reviewed.  Constitutional:      Appearance: She is well-developed.  HENT:     Head: Normocephalic and atraumatic.     Right Ear: Tympanic membrane, ear canal and external ear normal. No drainage, swelling or  tenderness. Tympanic membrane is not injected, scarred, erythematous, retracted or bulging.     Left Ear: Tympanic membrane, ear canal and external ear normal. No drainage, swelling or tenderness. Tympanic membrane is not injected, scarred, erythematous, retracted or bulging.     Nose: Rhinorrhea present. No nasal deformity, septal deviation or mucosal edema.     Right Turbinates: Pale. Not enlarged or swollen.     Left Turbinates: Pale. Not enlarged or swollen.     Right Sinus: No maxillary sinus tenderness or frontal sinus tenderness.     Left Sinus: No maxillary sinus tenderness or frontal sinus tenderness.     Comments: Allergic shiners present bilaterally.    Mouth/Throat:     Mouth: Mucous membranes are not pale and not dry.     Pharynx: Uvula midline.  Eyes:     General: Allergic shiner present.        Right eye: No discharge.        Left eye: No discharge.     Conjunctiva/sclera: Conjunctivae normal.     Right eye: Right conjunctiva is not injected. No chemosis.    Left eye: Left conjunctiva is not injected. No chemosis.    Pupils: Pupils are equal, round, and  reactive to light.  Cardiovascular:     Rate and Rhythm: Normal rate and regular rhythm.     Heart sounds: Normal heart sounds.  Pulmonary:     Effort: Pulmonary effort is normal. No tachypnea, accessory muscle usage or respiratory distress.     Breath sounds: Examination of the right-upper field reveals wheezing. Examination of the left-upper field reveals wheezing. Examination of the right-middle field reveals wheezing. Examination of the left-middle field reveals wheezing. Examination of the right-lower field reveals wheezing. Examination of the left-lower field reveals wheezing. Wheezing present. No rhonchi or rales.     Comments: Wheezing in all lung fields, but decreased at the bases.  There wheezing is more pronounced at the bases following 4 puffs of Xopenex. Chest:     Chest wall: No tenderness.  Abdominal:      Tenderness: There is no abdominal tenderness. There is no guarding or rebound.  Lymphadenopathy:     Head:     Right side of head: No submandibular, tonsillar or occipital adenopathy.     Left side of head: No submandibular, tonsillar or occipital adenopathy.     Cervical: No cervical adenopathy.  Skin:    Coloration: Skin is not pale.     Findings: No abrasion, erythema, petechiae or rash. Rash is not papular, urticarial or vesicular.  Neurological:     Mental Status: She is alert.  Psychiatric:        Behavior: Behavior is cooperative.     Diagnostic studies:   Spirometry: results abnormal (FEV1: 0.36/21%, FVC: 1.22/53%, FEV1/FVC: 30%).    Spirometry consistent with mixed obstructive and restrictive disease. Xopenex four puffs via MDI treatment given in clinic with significant improvement in FVC per ATS criteria.  However, the FEV1 did not improve and actually decreased.  Her wheezing became more prominent following Xopenex treatment.  Allergy Studies: labs sent instead due to her pulmonary status           Salvatore Marvel, MD Allergy and [place] [facility] [place]

## 2021-02-27 NOTE — Patient Instructions (Addendum)
1. Chronic rhinitis - We are going to send blood work since your lung testing was so poor today (and therefore skin testing could be dangerous). - We will call you in 1-2 weeks with the results of the testing. - In the interim, start ipratropium one spray per nostril every 6 hours as needed (start with one spray daily to see how this works, but you can increase this if needed).  - Try the nose spray first, but if this is not controlling it, you can do CLARITIN (loratadine) 10mg  EVERY 48 hours.   2. SOB (shortness of breath) - Lung testing looks terrible today, but this might be indicative of your congestive heart failure. - I think you need something every day to help control your inflammation. - Start Pulmicort (budesonide) 0.5mg  twice daily to see if this helps with your shortness of breath.  - Continue with albuterol nebulizer treatments every 4-6 hours as needed.  3. Return in about 6 weeks (around 04/10/2021).    Please inform us of any Emergency Department visits, hospitalizations, or changes in symptoms. Call us before going to the ED for breathing or allergy symptoms since we might be able to fit you in for a sick visit. Feel free to contact us anytime with any questions, problems, or concerns.  It was a pleasure to meet you today!  Websites that have reliable patient information: 1. American Academy of Asthma, Allergy, and Immunology: www.aaaai.org 2. Food Allergy Research and Education (FARE): foodallergy.org 3. Mothers of Asthmatics: http://www.asthmacommunitynetwork.org 4. American College of Allergy, Asthma, and Immunology: www.acaai.org   COVID-19 Vaccine Information can be found at: ShippingScam.co.uk For questions related to vaccine distribution or appointments, please email vaccine@[email or web address] [email or web address] or call [phone].   We realize that you might be concerned about having an allergic reaction to the COVID19  vaccines. To help with that concern, WE ARE OFFERING THE COVID19 VACCINES IN OUR OFFICE! Ask the front desk for dates!     "Like" [place] on Facebook and Instagram for our latest updates!      A healthy democracy works best when [employer] participate! Make sure you are registered to vote! If you have moved or changed any of your contact information, you will need to get this updated before voting!  In some cases, you MAY be able to register to vote online: [email or web address]

## 2021-02-27 NOTE — Telephone Encounter (Signed)
Unfortunately it is too late to call this evening, will need to call tomorrow 02/28/2021.

## 2021-02-27 NOTE — Telephone Encounter (Signed)
-----   Message from Valentina Shaggy, MD sent at 02/27/2021 12:29 PM EST ----- Can someone call [facility] in [place] to let them know that we added some labs? I originally ordered only the Zone 2.

## 2021-02-28 NOTE — Telephone Encounter (Signed)
Called patient and she plans to get her blood work soon. She has not got the blood work for zone 2 completed yet. I spoke to [place] and she has not got any blood work done since 01/18/21.

## 2021-03-01 ENCOUNTER — Ambulatory Visit (HOSPITAL_COMMUNITY): Payer: Medicare Other

## 2021-03-02 DIAGNOSIS — R0602 Shortness of breath: Secondary | ICD-10-CM | POA: Diagnosis not present

## 2021-03-06 LAB — ALLERGENS W/COMP RFLX AREA 2
Alternaria Alternata IgE: <0.1 kU/L
Aspergillus Fumigatus IgE: <0.1 kU/L
Bermuda Grass IgE: <0.1 kU/L
Cedar, Mountain IgE: <0.1 kU/L
Cladosporium Herbarum IgE: <0.1 kU/L
Cockroach, German IgE: <0.1 kU/L
Common Silver Birch IgE: <0.1 kU/L
Cottonwood IgE: <0.1 kU/L
D Farinae IgE: <0.1 kU/L
D Pteronyssinus IgE: <0.1 kU/L
E001-IgE Cat Dander: <0.1 kU/L
E005-IgE Dog Dander: <0.1 kU/L
Elm, American IgE: <0.1 kU/L
IgE (Immunoglobulin E), Serum: 47 IU/mL (ref 6–495)
Johnson Grass IgE: <0.1 kU/L
Maple/Box Elder IgE: <0.1 kU/L
Mouse Urine IgE: <0.1 kU/L
Oak, White IgE: <0.1 kU/L
Pecan, Hickory IgE: <0.1 kU/L
Penicillium Chrysogen IgE: <0.1 kU/L
Pigweed, Rough IgE: <0.1 kU/L
Ragweed, Short IgE: 0.2 kU/L — AB
Sheep Sorrel IgE Qn: <0.1 kU/L
Timothy Grass IgE: <0.1 kU/L
White Mulberry IgE: <0.1 kU/L

## 2021-03-06 LAB — ALPHA-1-ANTITRYPSIN: A-1 Antitrypsin: 149 mg/dL (ref 101–187)

## 2021-03-06 LAB — ANCA TITERS
Atypical pANCA: <1:20 {titer}
C-ANCA: <1:20 {titer}
P-ANCA: <1:20 {titer}

## 2021-03-06 LAB — CBC WITH DIFFERENTIAL/PLATELET
Basophils Absolute: 0.1 10*3/uL (ref 0.0–0.2)
Basos: 1 %
EOS (ABSOLUTE): 1.2 10*3/uL — ABNORMAL HIGH (ref 0.0–0.4)
Eos: 18 %
Hematocrit: 39.5 % (ref 34.0–46.6)
Hemoglobin: 12.8 g/dL (ref 11.1–15.9)
Immature Grans (Abs): 0 10*3/uL (ref 0.0–0.1)
Immature Granulocytes: 0 %
Lymphocytes Absolute: 2.9 10*3/uL (ref 0.7–3.1)
Lymphs: 43 %
MCH: 30.8 pg (ref 26.6–33.0)
MCHC: 32.4 g/dL (ref 31.5–35.7)
MCV: 95 fL (ref 79–97)
Monocytes Absolute: 0.6 10*3/uL (ref 0.1–0.9)
Monocytes: 8 %
Neutrophils Absolute: 2.1 10*3/uL (ref 1.4–7.0)
Neutrophils: 30 %
Platelets: 193 10*3/uL (ref 150–450)
RBC: 4.15 x10E6/uL (ref 3.77–5.28)
RDW: 11.6 % — ABNORMAL LOW (ref 11.7–15.4)
WBC: 6.8 10*3/uL (ref 3.4–10.8)

## 2021-03-06 LAB — ASPERGILLUS PRECIPITINS
A.Fumigatus #1 Abs: NEGATIVE
Aspergillus Flavus Antibodies: NEGATIVE
Aspergillus Niger Antibodies: NEGATIVE
Aspergillus glaucus IgG: NEGATIVE
Aspergillus nidulans IgG: NEGATIVE
Aspergillus terreus IgG: NEGATIVE

## 2021-03-07 ENCOUNTER — Telehealth: Payer: Self-pay | Admitting: Allergy & Immunology

## 2021-03-07 NOTE — Telephone Encounter (Signed)
Bailey Bishop called in and states she would like her lab results.

## 2021-03-07 NOTE — Telephone Encounter (Signed)
Lab results are back, can you please review them for the patient. Patient stated that if we call and she doesn't answer, because she is going into town ,to please leave a detailed message.

## 2021-03-07 NOTE — Telephone Encounter (Signed)
Sounds good - let's start [facility] approval process. Sending to Tam Jackelyn Hoehn, MD Allergy and [place] [facility]

## 2021-03-09 NOTE — Telephone Encounter (Signed)
Called patient and discussed PAP for free Fasenra through [place] and will mail app for patient to return to me

## 2021-03-13 DIAGNOSIS — J069 Acute upper respiratory infection, unspecified: Secondary | ICD-10-CM | POA: Diagnosis not present

## 2021-04-05 NOTE — Telephone Encounter (Signed)
Patient called stating she got something in the mail stating she was approved to start [place]. Patient wants to start but she is worried it will not be good for her. Patient states she has 3rd stage kidney disease and congestive heart failure. Dr Ernst Bowler the patient is wanting to know is there any research on patients who take this medication with her conditions?

## 2021-04-07 ENCOUNTER — Ambulatory Visit: Payer: Medicare Other | Admitting: Allergy & Immunology

## 2021-04-10 NOTE — Telephone Encounter (Signed)
It is not renally cleared, per the package insert. I do have a few patients with both asthma and CHF who are doing fine on it.   Tammy - do you have any other recommendations/insight?   Salvatore Marvel, MD Allergy and [place] [facility] [place]

## 2021-04-13 NOTE — Telephone Encounter (Signed)
Great - sounds good!   Bailey Marvel, MD Allergy and [place] [facility] [place]

## 2021-04-18 ENCOUNTER — Telehealth: Payer: Self-pay

## 2021-04-18 NOTE — Telephone Encounter (Signed)
Patient's Bailey Bishop came in today, left a message for patient to call the office to schedule an appointment.

## 2021-04-18 NOTE — Telephone Encounter (Signed)
Noted  

## 2021-04-18 NOTE — Telephone Encounter (Signed)
Patient called back and she is going to have to hold off on the [place]. She is having a lot heart issues right now & she has a lot of appointments to attend at this time. Patient states if things get better she will give [place] a call.

## 2021-04-19 ENCOUNTER — Ambulatory Visit: Payer: Medicare Other | Admitting: Allergy & Immunology

## 2021-04-19 NOTE — Telephone Encounter (Signed)
I completely understand.   Salvatore Marvel, MD Allergy and [place] [facility] [place]

## 2021-04-20 ENCOUNTER — Encounter (HOSPITAL_COMMUNITY)
Admission: RE | Admit: 2021-04-20 | Discharge: 2021-04-20 | Disposition: A | Discharge status: Home / Self Care | Payer: Medicare Other | Source: Ambulatory Visit | Attending: Nephrology | Admitting: Nephrology

## 2021-04-20 ENCOUNTER — Encounter (HOSPITAL_COMMUNITY): Payer: Self-pay

## 2021-04-20 ENCOUNTER — Other Ambulatory Visit: Payer: Self-pay

## 2021-04-20 DIAGNOSIS — D631 Anemia in chronic kidney disease: Secondary | ICD-10-CM | POA: Diagnosis not present

## 2021-04-20 DIAGNOSIS — N184 Chronic kidney disease, stage 4 (severe): Secondary | ICD-10-CM | POA: Insufficient documentation

## 2021-04-20 LAB — RENAL FUNCTION PANEL
Albumin: 4 g/dL (ref 3.5–5.0)
Anion gap: 7 (ref 5–15)
BUN: 28 mg/dL — ABNORMAL HIGH (ref 8–23)
CO2: 28 mmol/L (ref 22–32)
Calcium: 8.9 mg/dL (ref 8.9–10.3)
Chloride: 103 mmol/L (ref 98–111)
Creatinine, Ser: 1.86 mg/dL — ABNORMAL HIGH (ref 0.44–1.00)
GFR, Estimated: 27 mL/min — ABNORMAL LOW (ref >60)
Glucose, Bld: 105 mg/dL — ABNORMAL HIGH (ref 70–99)
Phosphorus: 3.7 mg/dL (ref 2.5–4.6)
Potassium: 3.9 mmol/L (ref 3.5–5.1)
Sodium: 138 mmol/L (ref 135–145)

## 2021-04-20 LAB — PROTEIN / CREATININE RATIO, URINE
Creatinine, Urine: 50.63 mg/dL
Protein Creatinine Ratio: 0.14 mg/mg{Cre} (ref 0.00–0.15)
Total Protein, Urine: 7 mg/dL

## 2021-04-20 LAB — IRON AND TIBC
Iron: 91 ug/dL (ref 28–170)
Saturation Ratios: 25 % (ref 10.4–31.8)
TIBC: 368 ug/dL (ref 250–450)
UIBC: 277 ug/dL

## 2021-04-20 LAB — CBC WITH DIFFERENTIAL/PLATELET
Abs Immature Granulocytes: 0.01 10*3/uL (ref 0.00–0.07)
Basophils Absolute: 0.1 10*3/uL (ref 0.0–0.1)
Basophils Relative: 1 %
Eosinophils Absolute: 1.1 10*3/uL — ABNORMAL HIGH (ref 0.0–0.5)
Eosinophils Relative: 15 %
HCT: 41.3 % (ref 36.0–46.0)
Hemoglobin: 13.7 g/dL (ref 12.0–15.0)
Immature Granulocytes: 0 %
Lymphocytes Relative: 31 %
Lymphs Abs: 2.3 10*3/uL (ref 0.7–4.0)
MCH: 32.9 pg (ref 26.0–34.0)
MCHC: 33.2 g/dL (ref 30.0–36.0)
MCV: 99 fL (ref 80.0–100.0)
Monocytes Absolute: 0.7 10*3/uL (ref 0.1–1.0)
Monocytes Relative: 9 %
Neutro Abs: 3.3 10*3/uL (ref 1.7–7.7)
Neutrophils Relative %: 44 %
Platelets: 242 10*3/uL (ref 150–400)
RBC: 4.17 MIL/uL (ref 3.87–5.11)
RDW: 12.5 % (ref 11.5–15.5)
WBC: 7.6 10*3/uL (ref 4.0–10.5)
nRBC: 0 % (ref 0.0–0.2)

## 2021-04-20 LAB — POCT HEMOGLOBIN-HEMACUE: Hemoglobin: 14.7 g/dL (ref 12.0–15.0)

## 2021-04-20 MED ORDER — EPOETIN ALFA 3000 UNIT/ML IJ SOLN: 3000.0000 [IU] | Freq: Once | INTRAMUSCULAR | Status: DC

## 2021-04-21 LAB — PTH, INTACT AND CALCIUM
Calcium, Total (PTH): 9.5 mg/dL (ref 8.7–10.3)
PTH: 102 pg/mL — ABNORMAL HIGH (ref 15–65)

## 2021-04-26 DIAGNOSIS — E211 Secondary hyperparathyroidism, not elsewhere classified: Secondary | ICD-10-CM | POA: Diagnosis not present

## 2021-04-26 DIAGNOSIS — I129 Hypertensive chronic kidney disease with stage 1 through stage 4 chronic kidney disease, or unspecified chronic kidney disease: Secondary | ICD-10-CM | POA: Diagnosis not present

## 2021-04-26 DIAGNOSIS — N184 Chronic kidney disease, stage 4 (severe): Secondary | ICD-10-CM | POA: Diagnosis not present

## 2021-04-26 DIAGNOSIS — I5022 Chronic systolic (congestive) heart failure: Secondary | ICD-10-CM | POA: Diagnosis not present

## 2021-05-18 ENCOUNTER — Encounter (HOSPITAL_COMMUNITY): Payer: Medicare Other

## 2021-05-25 DIAGNOSIS — H524 Presbyopia: Secondary | ICD-10-CM | POA: Diagnosis not present

## 2021-05-25 DIAGNOSIS — Z961 Presence of intraocular lens: Secondary | ICD-10-CM | POA: Diagnosis not present

## 2021-05-30 ENCOUNTER — Telehealth (HOSPITAL_COMMUNITY): Payer: Self-pay | Admitting: Pharmacy Technician

## 2021-05-30 NOTE — Telephone Encounter (Signed)
Advanced Heart Failure Patient Advocate Encounter ? ?Spoke with patient's husband regarding Eliquis renewal. Explained OOP requirement and provided a month of samples as well. Application at check in desk.  ? ?Medication Samples have been provided to the patient. ? ?Drug name: Eliquis       Strength: 2.'5mg'$         Qty: one month  LOT: [id number]  Exp.Date: 09/24 ? ?Dosing instructions: one tablet twice daily  ? ?The patient has been instructed regarding the correct time, dose, and frequency of taking this medication, including desired effects and most common side effects.  ? ?Charlann Boxer ?10:35 AM ?05/30/2021 ? ?

## 2021-06-06 ENCOUNTER — Encounter (HOSPITAL_COMMUNITY): Payer: Self-pay | Admitting: Cardiology

## 2021-06-06 ENCOUNTER — Ambulatory Visit (HOSPITAL_COMMUNITY)
Admission: RE | Admit: 2021-06-06 | Discharge: 2021-06-06 | Disposition: A | Discharge status: Home / Self Care | Payer: Medicare Other | Source: Ambulatory Visit | Attending: Cardiology | Admitting: Cardiology

## 2021-06-06 ENCOUNTER — Other Ambulatory Visit: Payer: Self-pay

## 2021-06-06 ENCOUNTER — Other Ambulatory Visit (HOSPITAL_COMMUNITY): Payer: Self-pay

## 2021-06-06 ENCOUNTER — Telehealth (HOSPITAL_COMMUNITY): Payer: Self-pay | Admitting: Pharmacy Technician

## 2021-06-06 VITALS — BP 120/78 | HR 81 | Wt 123.8 lb

## 2021-06-06 DIAGNOSIS — I272 Pulmonary hypertension, unspecified: Secondary | ICD-10-CM | POA: Insufficient documentation

## 2021-06-06 DIAGNOSIS — N184 Chronic kidney disease, stage 4 (severe): Secondary | ICD-10-CM | POA: Diagnosis not present

## 2021-06-06 DIAGNOSIS — I482 Chronic atrial fibrillation, unspecified: Secondary | ICD-10-CM | POA: Diagnosis not present

## 2021-06-06 DIAGNOSIS — Z7901 Long term (current) use of anticoagulants: Secondary | ICD-10-CM | POA: Diagnosis not present

## 2021-06-06 DIAGNOSIS — I4819 Other persistent atrial fibrillation: Secondary | ICD-10-CM

## 2021-06-06 DIAGNOSIS — D649 Anemia, unspecified: Secondary | ICD-10-CM | POA: Diagnosis not present

## 2021-06-06 DIAGNOSIS — I130 Hypertensive heart and chronic kidney disease with heart failure and stage 1 through stage 4 chronic kidney disease, or unspecified chronic kidney disease: Secondary | ICD-10-CM | POA: Diagnosis not present

## 2021-06-06 DIAGNOSIS — I5042 Chronic combined systolic (congestive) and diastolic (congestive) heart failure: Secondary | ICD-10-CM | POA: Diagnosis not present

## 2021-06-06 DIAGNOSIS — Z79899 Other long term (current) drug therapy: Secondary | ICD-10-CM | POA: Insufficient documentation

## 2021-06-06 DIAGNOSIS — I5032 Chronic diastolic (congestive) heart failure: Secondary | ICD-10-CM | POA: Diagnosis not present

## 2021-06-06 DIAGNOSIS — I3139 Other pericardial effusion (noninflammatory): Secondary | ICD-10-CM | POA: Diagnosis not present

## 2021-06-06 DIAGNOSIS — I509 Heart failure, unspecified: Secondary | ICD-10-CM | POA: Diagnosis not present

## 2021-06-06 LAB — BASIC METABOLIC PANEL
Anion gap: 8 (ref 5–15)
BUN: 26 mg/dL — ABNORMAL HIGH (ref 8–23)
CO2: 31 mmol/L (ref 22–32)
Calcium: 9.2 mg/dL (ref 8.9–10.3)
Chloride: 100 mmol/L (ref 98–111)
Creatinine, Ser: 1.73 mg/dL — ABNORMAL HIGH (ref 0.44–1.00)
GFR, Estimated: 29 mL/min — ABNORMAL LOW (ref >60)
Glucose, Bld: 104 mg/dL — ABNORMAL HIGH (ref 70–99)
Potassium: 3.7 mmol/L (ref 3.5–5.1)
Sodium: 139 mmol/L (ref 135–145)

## 2021-06-06 LAB — CBC
HCT: 40.1 % (ref 36.0–46.0)
Hemoglobin: 13.3 g/dL (ref 12.0–15.0)
MCH: 31.6 pg (ref 26.0–34.0)
MCHC: 33.2 g/dL (ref 30.0–36.0)
MCV: 95.2 fL (ref 80.0–100.0)
Platelets: 183 10*3/uL (ref 150–400)
RBC: 4.21 MIL/uL (ref 3.87–5.11)
RDW: 12.2 % (ref 11.5–15.5)
WBC: 6.5 10*3/uL (ref 4.0–10.5)
nRBC: 0 % (ref 0.0–0.2)

## 2021-06-06 MED ORDER — TAFAMIDIS 61 MG PO CAPS: 61.0000 mg | ORAL_CAPSULE | Freq: Every day | ORAL | 0 refills | Status: DC

## 2021-06-06 NOTE — Patient Instructions (Signed)
Good to see you today! ? ?Lab work done today we will call you  with any abnormal results ? ?We want to start you on Tafamidis 61 mg daily. We will start to authorization process with insurance,we will call you once approved. ? ?Your physician has requested that you have an echocardiogram. Echocardiography is a painless test that uses sound waves to create images of your heart. It provides your doctor with information about the size and shape of your heart and how well your heart?s chambers and valves are working. This procedure takes approximately one hour. There are no restrictions for this procedure. ? ?Your physician recommends that you schedule a follow-up appointment in: 3 months with echocardiogram ? ?If you have any questions or concerns before your next appointment please send [place] a message through [place] or call our office at [phone].   ? ?TO LEAVE A MESSAGE FOR THE NURSE SELECT OPTION 2, PLEASE LEAVE A MESSAGE INCLUDING: ?YOUR NAME ?DATE OF BIRTH ?CALL BACK NUMBER ?REASON FOR CALL**this is important as we prioritize the call backs ? ?YOU WILL RECEIVE A CALL BACK THE SAME DAY AS LONG AS YOU CALL BEFORE 4:00 PM ? ?At the [facility], you and your health needs are our priority. As part of our continuing mission to provide you with exceptional heart care, we have created designated Provider Care Teams. These Care Teams include your primary Cardiologist (physician) and Advanced Practice Providers (APPs- Physician Assistants and Nurse Practitioners) who all work together to provide you with the care you need, when you need it.  ? ?You may see any of the following providers on your designated Care Team at your next follow up: ?Dr Glori Bickers ?Dr [name] ?Darrick Grinder, NP ?Lyda Jester, PA ?Jessica Milford,NP ?Marlyce Huge, PA ?Audry Riles, PharmD ? ? ?Please be sure to bring in all your medications bottles to every appointment.  ? ? ? ?

## 2021-06-06 NOTE — Telephone Encounter (Addendum)
Patient Advocate Encounter ?  ?Received notification from [place] that prior authorization for Vyndamax is required. ?  ?PA submitted on CoverMyMeds ?Key [id number] ?Status is pending ?  ?Will continue to follow. ? ? ?The patient was approved for a [employer] grant that will help cover the cost of Vyndamax. Total amount awarded, $10,000. 05/07/21-05/06/22. ? ?ID [id number] ?  ?BIN [id number] ?  ?PCN [id number] ?  ?GROUP [id number] ? ?

## 2021-06-06 NOTE — Telephone Encounter (Signed)
Advanced Heart Failure Patient Advocate Encounter ? ?Prior Authorization for [employer] has been approved.   ? ?PA#  [id number] ?Effective dates: 06/06/21 through 03/25/22 ? ?Patients co-pay is $3,355.56 (30 days) Hopeful copay will drop next month now that she is going into catastrophic coverage. Placed patient on pan waitlist as well.  ? ?[facility].  ?

## 2021-06-06 NOTE — Progress Notes (Signed)
PCP: [name], MD ?HF Cardiology: Dr. Aundra Dubin ? ?81 y.o. with history of CKD 4, chronic atrial fibrillation, anemia, and chronic systolic CHF was referred initially by [name] for evaluation of CHF.  Patient has a complex past history.  She has a long history of atrial fibrillation, now chronic as she has failed DCCV and Tikosyn and did not tolerate amiodarone.  Her last echo in 6/21 showed EF 35-40% with mild RV dysfunction, large pericardial effusion without tamponade, moderate MR, moderate TR.  Cause of cardiomyopathy is not certain.   ? ?She has been admitted multiple times to [facility] She feels like hospitalizations have not helped her shortness of breath.  She was admitted to [facility] 10/29/19.  She was found to be anemic with hgb 6.4, FOBT+.  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  Hgb was 7.5 at time of discharge.  Eliquis was helped for several days, she was told to restart it on 11/04/19.   ? ?She was admitted again in 8/21 to [facility] with CHF exacerbation.  RHC in 8/21 showed elevated R>L filling pressures with mild pulmonary hypertension and low PVR. PAPi was low suggesting significant RV dysfunction.  Echo done today and reviewed showed EF 50-55% with septal hypokinesis, mild RV enlargement with mildly decreased RV systolic function, PASP 44 mmHg, moderate MR with 2 eccentric jets, moderate-severe TR, moderate lateral pericardial effusion (improved from prior echo).  ? ?PYP scan in 11/21 was grade 2 with H/CL 1.36. Negative myeloma panel and urine immunofixation. [employer] gene testing was negative for TTR mutations.  ? ?Echo in 2/22 showed EF 60-65%, normal RV, moderate biatrial enlargement, moderate MR, mild AS.  ? ?PYP 11/22: visual and quantitative assessment suggestive of TTR amyloid, however, results are in the equivocal range. ? ?Here today for 4 month follow-up. Has been feeling well. Notes some dyspnea with exertion which is chronic and has not progressed over the last few  months. Can walk about a block before she must stop to catch her breath. She is able to do grocery shopping. No orthopnea, PND or LE edema. Denies CP.  Reports paresthesias in her lower extremities. Taking all medications as prescribed. ? ? ?Labs (8/21): K 3.9, creatinine 2.35, hgb 7.5, FOBT+ ?Labs (9/21): K 3.7, creatinine 2.44 ?Labs (10/21): hgb 11.9, K 3.5, creatinine 1.97 ?Las (11/21): hgb 12.3 ?Labs (12/21): K 3.6, creatinine 2.04 ?Labs (1/22): hgb 12.5 ?Labs (5/22): K 4, creatinine 1.9, hgb 12.8 ?Labs (10/22): K 3.9, creatinine 1.72, hgb 13.3 ?Labs (01/23): K 3.9, creatinine 1.86, hgb 13.7 ? ?PMH: ?1. CKD stage 4 ?2. Chronic atrial fibrillation: Failed DCCV in 11/18.  ?- Failed Tikosyn in 9/19.  ?- Intolerant of calcium channel blockers.  ?- Intolerant of amiodarone.  ?3. HTN ?4. Asthma ?5. GERD ?6. Chronic systolic => diastolic CHF: Uncertain etiology.  ?- Echo (6/21): EF 35-40%, global hypokinesis, mildly decreased RV systolic function with mild RV dilation, biatrial enlargement, large pericardial effusion without tamponade, moderate MR, moderate TR, IVC dilated.  ?- RHC (8/21): mean RA 18, PA 35/18, mean PCWP 16, CI 3.6, PVR 1.5 WU, PAPi 0.9 => primarily RV failure ?- V/Q scan (8/21): Not suggestive of chronic PEs ?- Echo (10/21): EF 50-55% with septal hypokinesis, mild RV enlargement with mildly decreased RV systolic function, PASP 44 mmHg, moderate MR with 2 eccentric jets, moderate-severe TR, moderate lateral pericardial effusion (improved from prior echo)  ?- PYP scan (11/21): grade 2 with H/CL 1.36.  Myeloma panel negative,  urine immunofixation negative. [employer] gene testing was negative for TTR mutations.  ?- Echo (2/22): EF 60-65%, normal RV, moderate biatrial enlargement, moderate MR, mild AS. ?- PYP scan (11/22): read as grade 1 but looks like grade 2 to me, H/CL 1.4 ?7. Anemia of chronic renal disease.  ?8. Breast cancer: Bilateral mastectomy.  ?9. GI bleeding: Admission in 8/21. EGD/c-scope in  6/21 negative.  ?10. Aortic stenosis: Mild on 2/22 echo.  ?11. Osteoporosis ?12. COPD: History of prior smoking.  PFTs in 12/21 with mild obstruction.  ? ?Social History  ? ?Socioeconomic History  ? Marital status: Married  ?  Spouse name: [name]  ? Number of children: 1  ? Years of education: Not on file  ? Highest education level: Not on file  ?Occupational History  ? Occupation: retired  ?Tobacco Use  ? Smoking status: Former  ?  Packs/day: 1.00  ?  Years: 28.00  ?  Pack years: 28.00  ?  Types: Cigarettes  ?  Quit date: 07/17/1983  ?  Years since quitting: 37.9  ? Smokeless tobacco: Never  ?Vaping Use  ? Vaping Use: Never used  ?Substance and Sexual Activity  ? Alcohol use: No  ? Drug use: No  ? Sexual activity: Not on file  ?Other Topics Concern  ? Not on file  ?Social History Narrative  ? Not on file  ? ?Social Determinants of Health  ? ?Financial Resource Strain: Not on file  ?Food Insecurity: Not on file  ?Transportation Needs: Not on file  ?Physical Activity: Not on file  ?Stress: Not on file  ?Social Connections: Not on file  ?Intimate Partner Violence: Not on file  ? ?Family History  ?Problem Relation Age of Onset  ? Cancer Mother   ? Aneurysm Father   ? Cancer Sister   ? Cancer Sister   ? Anesthesia problems Neg Hx   ? Hypotension Neg Hx   ? Malignant hyperthermia Neg Hx   ? Pseudochol deficiency Neg Hx   ? Colon cancer Neg Hx   ? ?ROS: All systems reviewed and negative except as per HPI. ? ?Current Outpatient Medications  ?Medication Sig Dispense Refill  ? acetaminophen (TYLENOL) 650 MG CR tablet Take 650 mg by mouth every 8 (eight) hours as needed for pain.    ? albuterol (VENTOLIN HFA) 108 (90 Base) MCG/ACT inhaler Inhale 1-2 puffs into the lungs every 6 (six) hours as needed for wheezing or shortness of breath. 18 g 3  ? ALPRAZolam (XANAX) 0.5 MG tablet Take 0.5 mg by mouth at bedtime.    ? apixaban (ELIQUIS) 2.5 MG TABS tablet Take 1 tablet (2.5 mg total) by mouth 2 (two) times daily. 60 tablet 11  ?  bisoprolol (ZEBETA) 5 MG tablet TAKE 0.5 TABLETS (2.'5MG'$  TOTAL) BY MOUTH DAILY 45 tablet 3  ? budesonide (PULMICORT) 0.5 MG/2ML nebulizer solution Take 2 mLs (0.5 mg total) by nebulization 2 (two) times daily. 250 mL 5  ? budesonide-formoterol (SYMBICORT) 80-4.5 MCG/ACT inhaler Inhale 2 puffs into the lungs as needed.    ? Calcitriol (ROCALTROL PO) Take 1 capsule by mouth 3 (three) times a week.    ? fluticasone (FLONASE) 50 MCG/ACT nasal spray Place 2 sprays into both nostrils as needed for allergies or rhinitis.    ? ipratropium (ATROVENT) 0.03 % nasal spray Place 2 sprays into both nostrils in the morning, at noon, in the evening, and at bedtime. 30 mL 5  ? loratadine (CLARITIN) 10 MG tablet Take 1 tablet (10  mg total) by mouth every other day. 30 tablet 5  ? potassium chloride SA (KLOR-CON) 20 MEQ tablet Take 2 tablets (40 mEq total) by mouth daily. 180 tablet 3  ? Tafamidis 61 MG CAPS Take 61 mg by mouth daily. 30 capsule 0  ? torsemide (DEMADEX) 20 MG tablet Take 3 tablets (60 mg total) by mouth 2 (two) times daily. 540 tablet 3  ? ?No current facility-administered medications for this encounter.  ? ?BP 120/78   Pulse 81   Wt 56.2 kg (123 lb 12.8 oz)   SpO2 96%   BMI 22.64 kg/m?  ?General:  Well appearing. No resp difficulty ?HEENT: normal ?Neck: supple. no JVD. Carotids 2+ bilat; no bruits. No lymphadenopathy or thryomegaly appreciated. ?Cor: PMI nondisplaced. Irregular rhythm, rate 80s. No rubs, gallops or murmurs. ?Lungs: clear ?Abdomen: soft, nontender, nondistended. No hepatosplenomegaly.  ?Extremities: no cyanosis, clubbing, rash, edema ?Neuro: alert & orientedx3, cranial nerves grossly intact. moves all 4 extremities w/o difficulty. Affect pleasant ? ? ?Assessment/Plan: ?1. Chronic systolic => diastolic CHF:  Prominent RV failure.  Echo in 6/21 with EF 35-40% with mild RV dysfunction, large pericardial effusion without tamponade, moderate MR, moderate TR.  RHC in 8/21 with preserved cardiac output,  primarily RV failure, mild pulmonary hypertension with low PVR. V/Q scan in 8/21 not suggestive of chronic PEs.  Echo in 10/21 with EF 50-55% with septal hypokinesis, mild RV enlargement with mildly decreased

## 2021-06-08 ENCOUNTER — Other Ambulatory Visit (HOSPITAL_COMMUNITY): Payer: Self-pay

## 2021-06-08 ENCOUNTER — Telehealth (HOSPITAL_COMMUNITY): Payer: Self-pay | Admitting: Pharmacist

## 2021-06-08 ENCOUNTER — Encounter (HOSPITAL_COMMUNITY): Payer: Self-pay | Admitting: Hematology

## 2021-06-08 MED ORDER — VYNDAMAX 61 MG PO CAPS
1.0000 | ORAL_CAPSULE | Freq: Every day | ORAL | 11 refills | Status: AC
  Filled 2021-06-08: qty 30, fill #0
  Filled 2021-06-08: qty 30, 30d supply, fill #0
  Filled 2021-07-03: qty 30, 30d supply, fill #1
  Filled 2021-08-04: qty 30, 30d supply, fill #2
  Filled 2021-08-29: qty 30, 30d supply, fill #3
  Filled 2021-09-27: qty 30, 30d supply, fill #4
  Filled 2021-10-24: qty 30, 30d supply, fill #5
  Filled 2021-11-23: qty 30, 30d supply, fill #6

## 2021-06-08 NOTE — Telephone Encounter (Signed)
Vyndamax prescription sent to [name].  ? ? ?Audry Riles, PharmD, BCPS, BCCP, CPP ?Heart Failure Clinic Pharmacist ?[phone] ? ?

## 2021-06-08 NOTE — Telephone Encounter (Signed)
Spoke with patient and set up mailing of [facility]. She is aware the call center will call and schedule shipping monthly. ? ?Charlann Boxer, CPhT ? ?

## 2021-06-09 NOTE — Telephone Encounter (Addendum)
Sent in application via fax. Of note, patient provided POI and OOP. The OOP has some co-pays for 2022, not sure if the patient will meet the 3% OOP for 2023 at this time. Document scanned to chart.  ? ? ?Will follow up. ? ?

## 2021-06-12 DIAGNOSIS — D509 Iron deficiency anemia, unspecified: Secondary | ICD-10-CM | POA: Diagnosis not present

## 2021-06-12 DIAGNOSIS — Z79899 Other long term (current) drug therapy: Secondary | ICD-10-CM | POA: Diagnosis not present

## 2021-06-13 ENCOUNTER — Other Ambulatory Visit (HOSPITAL_COMMUNITY): Payer: Self-pay

## 2021-06-15 DIAGNOSIS — I5042 Chronic combined systolic (congestive) and diastolic (congestive) heart failure: Secondary | ICD-10-CM | POA: Diagnosis not present

## 2021-06-15 DIAGNOSIS — M545 Low back pain, unspecified: Secondary | ICD-10-CM | POA: Diagnosis not present

## 2021-06-15 DIAGNOSIS — J4540 Moderate persistent asthma, uncomplicated: Secondary | ICD-10-CM | POA: Diagnosis not present

## 2021-06-15 DIAGNOSIS — D509 Iron deficiency anemia, unspecified: Secondary | ICD-10-CM | POA: Diagnosis not present

## 2021-06-15 DIAGNOSIS — N184 Chronic kidney disease, stage 4 (severe): Secondary | ICD-10-CM | POA: Diagnosis not present

## 2021-06-15 DIAGNOSIS — M810 Age-related osteoporosis without current pathological fracture: Secondary | ICD-10-CM | POA: Diagnosis not present

## 2021-06-15 DIAGNOSIS — E854 Organ-limited amyloidosis: Secondary | ICD-10-CM | POA: Diagnosis not present

## 2021-06-15 DIAGNOSIS — M159 Polyosteoarthritis, unspecified: Secondary | ICD-10-CM | POA: Diagnosis not present

## 2021-06-15 DIAGNOSIS — G4701 Insomnia due to medical condition: Secondary | ICD-10-CM | POA: Diagnosis not present

## 2021-06-15 DIAGNOSIS — K219 Gastro-esophageal reflux disease without esophagitis: Secondary | ICD-10-CM | POA: Diagnosis not present

## 2021-06-15 DIAGNOSIS — I4821 Permanent atrial fibrillation: Secondary | ICD-10-CM | POA: Diagnosis not present

## 2021-06-15 DIAGNOSIS — E211 Secondary hyperparathyroidism, not elsewhere classified: Secondary | ICD-10-CM | POA: Diagnosis not present

## 2021-06-20 ENCOUNTER — Other Ambulatory Visit (HOSPITAL_COMMUNITY): Payer: Self-pay | Admitting: *Deleted

## 2021-06-20 MED ORDER — APIXABAN 2.5 MG PO TABS: 2.5000 mg | ORAL_TABLET | Freq: Two times a day (BID) | ORAL | 3 refills | Status: AC

## 2021-06-22 NOTE — Telephone Encounter (Signed)
Advanced Heart Failure Patient Advocate Encounter ? ?Called [employer] to check the status of the patient's application. Representative stated that the patient has $425.98 more OOP in order to be approved for Eliquis. Total OOP requirement would be $639.02. ? ?

## 2021-06-26 ENCOUNTER — Other Ambulatory Visit (HOSPITAL_COMMUNITY): Payer: Self-pay | Admitting: Cardiology

## 2021-06-28 DIAGNOSIS — N184 Chronic kidney disease, stage 4 (severe): Secondary | ICD-10-CM | POA: Diagnosis not present

## 2021-06-28 DIAGNOSIS — I5022 Chronic systolic (congestive) heart failure: Secondary | ICD-10-CM | POA: Diagnosis not present

## 2021-06-28 DIAGNOSIS — E211 Secondary hyperparathyroidism, not elsewhere classified: Secondary | ICD-10-CM | POA: Diagnosis not present

## 2021-06-28 DIAGNOSIS — I129 Hypertensive chronic kidney disease with stage 1 through stage 4 chronic kidney disease, or unspecified chronic kidney disease: Secondary | ICD-10-CM | POA: Diagnosis not present

## 2021-06-28 NOTE — Telephone Encounter (Signed)
Advanced Heart Failure Patient Advocate Encounter ? ?Updated patient with detailed vm regarding remaining OOP.  ? ?Charlann Boxer, CPhT ? ?

## 2021-06-29 ENCOUNTER — Other Ambulatory Visit (HOSPITAL_COMMUNITY): Payer: Self-pay

## 2021-06-30 ENCOUNTER — Telehealth (HOSPITAL_COMMUNITY): Payer: Self-pay | Admitting: Pharmacy Technician

## 2021-06-30 NOTE — Telephone Encounter (Signed)
Advanced Heart Failure Patient Advocate Encounter ?  ?The PAN Amyloid grant has opened to waitlist applicants. Will know in 4 business days if patient was approved.  ?

## 2021-07-03 ENCOUNTER — Other Ambulatory Visit (HOSPITAL_COMMUNITY): Payer: Self-pay

## 2021-07-05 ENCOUNTER — Other Ambulatory Visit (HOSPITAL_COMMUNITY): Payer: Self-pay

## 2021-07-05 ENCOUNTER — Encounter (HOSPITAL_COMMUNITY): Payer: Self-pay | Admitting: Hematology

## 2021-07-05 NOTE — Telephone Encounter (Signed)
Advanced Heart Failure Patient Advocate Encounter ? ?The patient was approved for a PAN HF grant that will help cover the cost of Vyndamax. Total amount awarded, $8,500. Eligibility dates, 07/03/21 - 07/03/2022. Billing information added to [facility].  ?  ?BIN [id number] ?PCN PANF ?ID  [id number] ?Group [id number] ? ?Charlann Boxer, CPhT ? ?

## 2021-07-06 DIAGNOSIS — I129 Hypertensive chronic kidney disease with stage 1 through stage 4 chronic kidney disease, or unspecified chronic kidney disease: Secondary | ICD-10-CM | POA: Diagnosis not present

## 2021-07-06 DIAGNOSIS — E211 Secondary hyperparathyroidism, not elsewhere classified: Secondary | ICD-10-CM | POA: Diagnosis not present

## 2021-07-06 DIAGNOSIS — I5022 Chronic systolic (congestive) heart failure: Secondary | ICD-10-CM | POA: Diagnosis not present

## 2021-07-06 DIAGNOSIS — N184 Chronic kidney disease, stage 4 (severe): Secondary | ICD-10-CM | POA: Diagnosis not present

## 2021-08-03 ENCOUNTER — Other Ambulatory Visit (HOSPITAL_COMMUNITY): Payer: Self-pay

## 2021-08-04 ENCOUNTER — Telehealth (HOSPITAL_COMMUNITY): Payer: Self-pay | Admitting: Pharmacy Technician

## 2021-08-04 ENCOUNTER — Other Ambulatory Visit (HOSPITAL_COMMUNITY): Payer: Self-pay

## 2021-08-04 NOTE — Telephone Encounter (Signed)
Advanced Heart Failure Patient Advocate Encounter ? ?Patient called in requesting help with refilling Vyndamax. Set patient up for mail with the specialty pharmacy. ? ?Charlann Boxer, CPhT ? ?

## 2021-08-07 ENCOUNTER — Telehealth (HOSPITAL_COMMUNITY): Payer: Self-pay | Admitting: Licensed Clinical Social Worker

## 2021-08-07 NOTE — Telephone Encounter (Signed)
CSW consulted to help pt apply for LIS program to potentially help with medication copays.  CSW reached out to pt to discuss- unable to reach or leave VM- will attempt again at later time ? ?Jorge Ny, LCSW ?Clinical Social Worker ?[facility] ?Desk#: [phone] ?Cell#: [phone] ? ?

## 2021-08-09 ENCOUNTER — Telehealth (HOSPITAL_COMMUNITY): Payer: Self-pay | Admitting: Licensed Clinical Social Worker

## 2021-08-09 NOTE — Telephone Encounter (Signed)
CSW called pt back to discuss Extra Help program.  Pt requested CSW speak with her spouse to explain program.. After discussing pt spouse reports they have applied to that program multiple times and have been rejected.  CSW confirmed with him that they make about $2,800/month which is over the about $2,500 limit for couples ? ?Unfortunately not eligible at this time- no further needs at this time ? ?Jorge Ny, LCSW ?Clinical Social Worker ?[facility] Failure [facility] ?Desk#: [phone] ?Cell#: [phone] ? ?

## 2021-08-14 ENCOUNTER — Other Ambulatory Visit (HOSPITAL_COMMUNITY): Payer: Self-pay

## 2021-08-29 ENCOUNTER — Other Ambulatory Visit (HOSPITAL_COMMUNITY): Payer: Self-pay

## 2021-09-01 ENCOUNTER — Other Ambulatory Visit (HOSPITAL_COMMUNITY): Payer: Self-pay

## 2021-09-14 ENCOUNTER — Other Ambulatory Visit (HOSPITAL_COMMUNITY): Payer: Self-pay | Admitting: Cardiology

## 2021-09-18 ENCOUNTER — Other Ambulatory Visit (HOSPITAL_COMMUNITY): Payer: Self-pay

## 2021-09-18 ENCOUNTER — Ambulatory Visit (HOSPITAL_BASED_OUTPATIENT_CLINIC_OR_DEPARTMENT_OTHER)
Admission: RE | Admit: 2021-09-18 | Discharge: 2021-09-18 | Disposition: A | Discharge status: Home / Self Care | Payer: Medicare Other | Source: Ambulatory Visit | Attending: Cardiology | Admitting: Cardiology

## 2021-09-18 ENCOUNTER — Ambulatory Visit (HOSPITAL_COMMUNITY)
Admission: RE | Admit: 2021-09-18 | Discharge: 2021-09-18 | Disposition: A | Discharge status: Home / Self Care | Payer: Medicare Other | Source: Ambulatory Visit | Attending: Cardiology | Admitting: Cardiology

## 2021-09-18 VITALS — BP 130/80 | HR 79 | Wt 122.6 lb

## 2021-09-18 DIAGNOSIS — I5032 Chronic diastolic (congestive) heart failure: Secondary | ICD-10-CM

## 2021-09-18 DIAGNOSIS — I509 Heart failure, unspecified: Secondary | ICD-10-CM

## 2021-09-18 DIAGNOSIS — I4821 Permanent atrial fibrillation: Secondary | ICD-10-CM

## 2021-09-18 DIAGNOSIS — D631 Anemia in chronic kidney disease: Secondary | ICD-10-CM | POA: Diagnosis not present

## 2021-09-18 DIAGNOSIS — Z853 Personal history of malignant neoplasm of breast: Secondary | ICD-10-CM | POA: Diagnosis not present

## 2021-09-18 DIAGNOSIS — I482 Chronic atrial fibrillation, unspecified: Secondary | ICD-10-CM | POA: Diagnosis not present

## 2021-09-18 DIAGNOSIS — Z79899 Other long term (current) drug therapy: Secondary | ICD-10-CM | POA: Insufficient documentation

## 2021-09-18 DIAGNOSIS — I5042 Chronic combined systolic (congestive) and diastolic (congestive) heart failure: Secondary | ICD-10-CM | POA: Insufficient documentation

## 2021-09-18 DIAGNOSIS — I272 Pulmonary hypertension, unspecified: Secondary | ICD-10-CM | POA: Diagnosis not present

## 2021-09-18 DIAGNOSIS — I081 Rheumatic disorders of both mitral and tricuspid valves: Secondary | ICD-10-CM | POA: Insufficient documentation

## 2021-09-18 DIAGNOSIS — J449 Chronic obstructive pulmonary disease, unspecified: Secondary | ICD-10-CM | POA: Insufficient documentation

## 2021-09-18 DIAGNOSIS — Z7901 Long term (current) use of anticoagulants: Secondary | ICD-10-CM | POA: Diagnosis not present

## 2021-09-18 DIAGNOSIS — Z87891 Personal history of nicotine dependence: Secondary | ICD-10-CM | POA: Insufficient documentation

## 2021-09-18 DIAGNOSIS — I130 Hypertensive heart and chronic kidney disease with heart failure and stage 1 through stage 4 chronic kidney disease, or unspecified chronic kidney disease: Secondary | ICD-10-CM | POA: Diagnosis not present

## 2021-09-18 DIAGNOSIS — N184 Chronic kidney disease, stage 4 (severe): Secondary | ICD-10-CM | POA: Insufficient documentation

## 2021-09-18 LAB — ECHOCARDIOGRAM COMPLETE
AR max vel: 1.58 cm2
AV Area VTI: 1.57 cm2
AV Area mean vel: 1.42 cm2
AV Mean grad: 3.6 mmHg
AV Peak grad: 7.3 mmHg
Ao pk vel: 1.36 m/s
Area-P 1/2: 4.45 cm2
MV M vel: 5.18 m/s
MV Peak grad: 107.3 mmHg
MV VTI: 3.46 cm2
Radius: 0.37 cm
S' Lateral: 4.1 cm

## 2021-09-18 LAB — BASIC METABOLIC PANEL
Anion gap: 10 (ref 5–15)
BUN: 23 mg/dL (ref 8–23)
CO2: 27 mmol/L (ref 22–32)
Calcium: 9.1 mg/dL (ref 8.9–10.3)
Chloride: 104 mmol/L (ref 98–111)
Creatinine, Ser: 1.94 mg/dL — ABNORMAL HIGH (ref 0.44–1.00)
GFR, Estimated: 25 mL/min — ABNORMAL LOW (ref >60)
Glucose, Bld: 107 mg/dL — ABNORMAL HIGH (ref 70–99)
Potassium: 3.6 mmol/L (ref 3.5–5.1)
Sodium: 141 mmol/L (ref 135–145)

## 2021-09-18 LAB — BRAIN NATRIURETIC PEPTIDE: B Natriuretic Peptide: 344.8 pg/mL — ABNORMAL HIGH (ref 0.0–100.0)

## 2021-09-19 NOTE — H&P (View-Only) (Signed)
PCP: [name], MD HF Cardiology: Dr. Aundra Dubin  81 y.o. with history of CKD 4, chronic atrial fibrillation, anemia, and chronic systolic CHF was referred initially by [name] for evaluation of CHF.  Patient has a complex past history.  She has a long history of atrial fibrillation, now chronic as she has failed DCCV and Tikosyn and did not tolerate amiodarone.  Her last echo in 6/21 showed EF 35-40% with mild RV dysfunction, large pericardial effusion without tamponade, moderate MR, moderate TR.  Cause of cardiomyopathy is not certain.    She has been admitted multiple times to [facility]. She feels like hospitalizations have not helped her shortness of breath.  She was admitted to [facility] 10/29/19.  She was found to be anemic with hgb 6.4, FOBT+.  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  Hgb was 7.5 at time of discharge.  Eliquis was helped for several days, she was told to restart it on 11/04/19.    She was admitted again in 8/21 to [facility] with CHF exacerbation.  RHC in 8/21 showed elevated R>L filling pressures with mild pulmonary hypertension and low PVR. PAPi was low suggesting significant RV dysfunction.  Echo done today and reviewed showed EF 50-55% with septal hypokinesis, mild RV enlargement with mildly decreased RV systolic function, PASP 44 mmHg, moderate MR with 2 eccentric jets, moderate-severe TR, moderate lateral pericardial effusion (improved from prior echo).   PYP scan in 11/21 was grade 2 with H/CL 1.36. Negative myeloma panel and urine immunofixation. [employer] gene testing was negative for TTR mutations.   Echo in 2/22 showed EF 60-65%, normal RV, moderate biatrial enlargement, moderate MR, mild AS.   PYP scan 11/22: visual and quantitative assessment suggestive of TTR amyloid, however, results are in the equivocal range.  I reviewed the study and think it is suggestive of TTR cardiac amyloidosis. Genetic testing was negative. Tafamidis was started.   Echo was done  today and reviewed, EF 50-55%, mild RV dilation with mildly decreased systolic function, PASP 35 mmHg, moderate-severe MR, aortic sclerosis without stenosis, moderate TR.   Patient reports dyspnea walking up stairs, no dyspnea walking on flat ground.  Stable weight.  No chest pain.  No orthopnea/PND.  No BRBPR/melena.  No lightheadedness.   ECG (personally reviewed): atrial fibrillation  Labs (8/21): K 3.9, creatinine 2.35, hgb 7.5, FOBT+ Labs (9/21): K 3.7, creatinine 2.44 Labs (10/21): hgb 11.9, K 3.5, creatinine 1.97 Las (11/21): hgb 12.3 Labs (12/21): K 3.6, creatinine 2.04 Labs (1/22): hgb 12.5 Labs (5/22): K 4, creatinine 1.9, hgb 12.8 Labs (10/22): K 3.9, creatinine 1.72, hgb 13.3 Labs (01/23): K 3.9, creatinine 1.86, hgb 13.7 Labs (3/23): K 3.7, creatinine 1.7, hgb 13.3  PMH: 1. CKD stage 4 2. Chronic atrial fibrillation: Failed DCCV in 11/18.  - Failed Tikosyn in 9/19.  - Intolerant of calcium channel blockers.  - Intolerant of amiodarone.  3. HTN 4. Asthma 5. GERD 6. Chronic systolic => diastolic CHF: Uncertain etiology.  - Echo (6/21): EF 35-40%, global hypokinesis, mildly decreased RV systolic function with mild RV dilation, biatrial enlargement, large pericardial effusion without tamponade, moderate MR, moderate TR, IVC dilated.  - RHC (8/21): mean RA 18, PA 35/18, mean PCWP 16, CI 3.6, PVR 1.5 WU, PAPi 0.9 => primarily RV failure - V/Q scan (8/21): Not suggestive of chronic PEs - Echo (10/21): EF 50-55% with septal hypokinesis, mild RV enlargement with mildly decreased RV systolic function, PASP 44 mmHg, moderate MR with  2 eccentric jets, moderate-severe TR, moderate lateral pericardial effusion (improved from prior echo)  - PYP scan (11/21): grade 2 with H/CL 1.36.  Myeloma panel negative, urine immunofixation negative. [employer] gene testing was negative for TTR mutations.  - Echo (2/22): EF 60-65%, normal RV, moderate biatrial enlargement, moderate MR, mild AS. - PYP  scan (11/22): read as grade 1 but looks like grade 2 to me, H/CL 1.4. Suspect TTR cardiac amyloidosis.  - Echo (6/23): EF 50-55%, mild RV dilation with mildly decreased systolic function, PASP 35 mmHg, moderate-severe MR, aortic sclerosis without stenosis, moderate TR.  7. Anemia of chronic renal disease.  8. Breast cancer: Bilateral mastectomy.  9. GI bleeding: Admission in 8/21. EGD/c-scope in 6/21 negative.  10. Aortic stenosis: Mild on 2/22 echo, not present 6/23 echo.  11. Osteoporosis 12. COPD: History of prior smoking.  PFTs in 12/21 with mild obstruction.  13. TTR cardiac amyloidosis: Wild-type with negative [employer] gene testing.  14. Mitral regurgitation: moderate-severe 6/23 echo.   Social History   Socioeconomic History   Marital status: Married    Spouse name: [name]   Number of children: 1   Years of education: Not on file   Highest education level: Not on file  Occupational History   Occupation: retired  Tobacco Use   Smoking status: Former    Packs/day: 1.00    Years: 28.00    Total pack years: 28.00    Types: Cigarettes    Quit date: 07/17/1983    Years since quitting: 38.2   Smokeless tobacco: Never  Vaping Use   Vaping Use: Never used  Substance and Sexual Activity   Alcohol use: No   Drug use: No   Sexual activity: Not on file  Other Topics Concern   Not on file  Social History Narrative   Not on file   Social Determinants of Health   Financial Resource Strain: Not on file  Food Insecurity: No Food Insecurity (11/10/2019)   Hunger Vital Sign    Worried About Running Out of Food in the Last Year: Never true    Ran Out of Food in the Last Year: Never true  Transportation Needs: No Transportation Needs (04/27/2019)   PRAPARE - Hydrologist (Medical): No    Lack of Transportation (Non-Medical): No  Physical Activity: Not on file  Stress: Not on file  Social Connections: Moderately Integrated (11/10/2019)   Social Connection  and Isolation Panel [NHANES]    Frequency of Communication with Friends and Family: Once a week    Frequency of Social Gatherings with Friends and Family: Once a week    Attends Religious Services: 1 to 4 times per year    Active Member of [employer] or Organizations: No    Attends Music therapist: 1 to 4 times per year    Marital Status: Married  Human resources officer Violence: Not on file   Family History  Problem Relation Age of Onset   Cancer Mother    Aneurysm Father    Cancer Sister    Cancer Sister    Anesthesia problems Neg Hx    Hypotension Neg Hx    Malignant hyperthermia Neg Hx    Pseudochol deficiency Neg Hx    Colon cancer Neg Hx    ROS: All systems reviewed and negative except as per HPI.  Current Outpatient Medications  Medication Sig Dispense Refill   acetaminophen (TYLENOL) 650 MG CR tablet Take 650 mg by mouth every 8 (eight)  hours as needed for pain.     albuterol (VENTOLIN HFA) 108 (90 Base) MCG/ACT inhaler Inhale 1-2 puffs into the lungs every 6 (six) hours as needed for wheezing or shortness of breath. 18 g 3   ALPRAZolam (XANAX) 0.5 MG tablet Take 0.5 mg by mouth at bedtime.     apixaban (ELIQUIS) 2.5 MG TABS tablet Take 1 tablet (2.5 mg total) by mouth 2 (two) times daily. 180 tablet 3   bisoprolol (ZEBETA) 5 MG tablet TAKE 0.5 TABLETS (2.'5MG'$  TOTAL) BY MOUTH DAILY 45 tablet 3   budesonide (PULMICORT) 0.5 MG/2ML nebulizer solution Take 2 mLs (0.5 mg total) by nebulization 2 (two) times daily. (Patient not taking: Reported on 09/18/2021) 250 mL 5   budesonide-formoterol (SYMBICORT) 80-4.5 MCG/ACT inhaler Inhale 2 puffs into the lungs daily as needed (Asthma).     calcitRIOL (ROCALTROL) 0.25 MCG capsule Take 0.25 mcg by mouth every Monday, Wednesday, and Friday.     fluticasone (FLONASE) 50 MCG/ACT nasal spray Place 2 sprays into both nostrils as needed for allergies or rhinitis.     ipratropium (ATROVENT) 0.03 % nasal spray Place 2 sprays into both nostrils  in the morning, at noon, in the evening, and at bedtime. (Patient not taking: Reported on 09/18/2021) 30 mL 5   potassium chloride SA (KLOR-CON) 20 MEQ tablet Take 2 tablets (40 mEq total) by mouth daily. 180 tablet 3   Tafamidis (VYNDAMAX) 61 MG CAPS Take 1 capsule by mouth daily. 30 capsule 11   torsemide (DEMADEX) 20 MG tablet TAKE THREE TABLETS BY MOUTH TWICE A DAY. 540 tablet 3   No current facility-administered medications for this encounter.   BP 130/80   Pulse 79   Wt 55.6 kg (122 lb 9.6 oz)   SpO2 98%   BMI 22.42 kg/m  General: NAD Neck: JVP 8 cm, no thyromegaly or thyroid nodule.  Lungs: Clear to auscultation bilaterally with normal respiratory effort. CV: Nondisplaced PMI.  Heart irregular S1/S2, no S3/S4, 3/6 HSM apex.  No peripheral edema.  No carotid bruit.  Normal pedal pulses.  Abdomen: Soft, nontender, no hepatosplenomegaly, no distention.  Skin: Intact without lesions or rashes.  Neurologic: Alert and oriented x 3.  Psych: Normal affect. Extremities: No clubbing or cyanosis.  HEENT: Normal.   Assessment/Plan: 1. Chronic systolic => diastolic CHF:  Prominent RV failure.  Echo in 6/21 with EF 35-40% with mild RV dysfunction, large pericardial effusion without tamponade, moderate MR, moderate TR.  RHC in 8/21 with preserved cardiac output, primarily RV failure, mild pulmonary hypertension with low PVR. V/Q scan in 8/21 not suggestive of chronic PEs.  Echo in 10/21 with EF 50-55% with septal hypokinesis, mild RV enlargement with mildly decreased RV systolic function, PASP 44 mmHg, moderate MR with 2 eccentric jets, moderate-severe TR, moderate lateral pericardial effusion (improved from prior echo).  PYP scan in 11/21 was equivocal for TTR cardiac amyloidosis (grade 2 but H/CL < 1.5), [facility] gene testing was negative for TTR mutations.  Echo in 2/22 showed EF 60-65%, normal RV, moderate biatrial enlargement, moderate MR, mild AS, no pericardial effusion.  Repeat PYP scan in  11/22 was closely reviewed, think consistent with TTR cardiac amyloidosis (wild type).  Echo today showed EF 50-55%, mild RV dilation with mildly decreased systolic function, PASP 35 mmHg, moderate-severe MR, aortic sclerosis without stenosis, moderate TR. NYHA class II-III, she is not volume overloaded on exam.  - Continue torsemide 60 mg bid.  - She does not want to take SGLT2 inhibitor  due to UTI risk.     - Continue KCl 40 daily.    - Continue bisoprolol 2.5 mg daily.  - No digoxin, Entresto, spironolactone with improved LV EF and CKD stage IV.  - BMET/BNP today.  2. Atrial fibrillation: Chronic, has failed DCCV and Tikosyn.  She has not tolerated amiodarone.  Rate control good on bisoprolol 2.5 mg daily.  - Continue bisoprolol 2.5 daily.  - Continue Eliquis 2.5 mg bid.   3. Anemia: Multifactorial, suspect due to renal disease as well as GI bleeding.  Hgb down to 6.5 in 8/21 in setting of GI bleeding with FOBT+.  Hgb stable at 13.3 in 3/23.     - No blood products ([employer]).  - Following with hematology for Aranesp/feraheme.  4. CKD: Stage IV.  BMET today.   5. Pericardial effusion: Large on 6/21 echo without tamponade, echo in 10/21 showed pericardial effusion moderate laterally (smaller).  Echo in 2/22 showed resolution of pericardial effusion.  6. Cardiac amyloidosis: Wild-type TTR cardiac amyloidosis.  [facility] gene testing negative.  - Continue tafamidis.  7. Mitral regurgitation: Moderate-severe, eccentric on today's echo.  Possible atrial functional MR with severe LAE.   - I will arrange for TEE to more closely assess MR.  If severe, she may benefit from Mitraclip. Discussed risks/benefits and she agrees to procedure.   Followup 3 months   [name] 09/19/2021

## 2021-09-19 NOTE — Progress Notes (Signed)
PCP: [name], MD HF Cardiology: Dr. Aundra Dubin  81 y.o. with history of CKD 4, chronic atrial fibrillation, anemia, and chronic systolic CHF was referred initially by [name] for evaluation of CHF.  Patient has a complex past history.  She has a long history of atrial fibrillation, now chronic as she has failed DCCV and Tikosyn and did not tolerate amiodarone.  Her last echo in 6/21 showed EF 35-40% with mild RV dysfunction, large pericardial effusion without tamponade, moderate MR, moderate TR.  Cause of cardiomyopathy is not certain.    She has been admitted multiple times to [facility]. She feels like hospitalizations have not helped her shortness of breath.  She was admitted to [facility] 10/29/19.  She was found to be anemic with hgb 6.4, FOBT+.  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  Hgb was 7.5 at time of discharge.  Eliquis was helped for several days, she was told to restart it on 11/04/19.    She was admitted again in 8/21 to [facility] with CHF exacerbation.  RHC in 8/21 showed elevated R>L filling pressures with mild pulmonary hypertension and low PVR. PAPi was low suggesting significant RV dysfunction.  Echo done today and reviewed showed EF 50-55% with septal hypokinesis, mild RV enlargement with mildly decreased RV systolic function, PASP 44 mmHg, moderate MR with 2 eccentric jets, moderate-severe TR, moderate lateral pericardial effusion (improved from prior echo).   PYP scan in 11/21 was grade 2 with H/CL 1.36. Negative myeloma panel and urine immunofixation. [facility] gene testing was negative for TTR mutations.   Echo in 2/22 showed EF 60-65%, normal RV, moderate biatrial enlargement, moderate MR, mild AS.   PYP scan 11/22: visual and quantitative assessment suggestive of TTR amyloid, however, results are in the equivocal range.  I reviewed the study and think it is suggestive of TTR cardiac amyloidosis. Genetic testing was negative. Tafamidis was started.   Echo was done  today and reviewed, EF 50-55%, mild RV dilation with mildly decreased systolic function, PASP 35 mmHg, moderate-severe MR, aortic sclerosis without stenosis, moderate TR.   Patient reports dyspnea walking up stairs, no dyspnea walking on flat ground.  Stable weight.  No chest pain.  No orthopnea/PND.  No BRBPR/melena.  No lightheadedness.   ECG (personally reviewed): atrial fibrillation  Labs (8/21): K 3.9, creatinine 2.35, hgb 7.5, FOBT+ Labs (9/21): K 3.7, creatinine 2.44 Labs (10/21): hgb 11.9, K 3.5, creatinine 1.97 Las (11/21): hgb 12.3 Labs (12/21): K 3.6, creatinine 2.04 Labs (1/22): hgb 12.5 Labs (5/22): K 4, creatinine 1.9, hgb 12.8 Labs (10/22): K 3.9, creatinine 1.72, hgb 13.3 Labs (01/23): K 3.9, creatinine 1.86, hgb 13.7 Labs (3/23): K 3.7, creatinine 1.7, hgb 13.3  PMH: 1. CKD stage 4 2. Chronic atrial fibrillation: Failed DCCV in 11/18.  - Failed Tikosyn in 9/19.  - Intolerant of calcium channel blockers.  - Intolerant of amiodarone.  3. HTN 4. Asthma 5. GERD 6. Chronic systolic => diastolic CHF: Uncertain etiology.  - Echo (6/21): EF 35-40%, global hypokinesis, mildly decreased RV systolic function with mild RV dilation, biatrial enlargement, large pericardial effusion without tamponade, moderate MR, moderate TR, IVC dilated.  - RHC (8/21): mean RA 18, PA 35/18, mean PCWP 16, CI 3.6, PVR 1.5 WU, PAPi 0.9 => primarily RV failure - V/Q scan (8/21): Not suggestive of chronic PEs - Echo (10/21): EF 50-55% with septal hypokinesis, mild RV enlargement with mildly decreased RV systolic function, PASP 44 mmHg, moderate MR with  2 eccentric jets, moderate-severe TR, moderate lateral pericardial effusion (improved from prior echo)  - PYP scan (11/21): grade 2 with H/CL 1.36.  Myeloma panel negative, urine immunofixation negative. [facility] gene testing was negative for TTR mutations.  - Echo (2/22): EF 60-65%, normal RV, moderate biatrial enlargement, moderate MR, mild AS. - PYP  scan (11/22): read as grade 1 but looks like grade 2 to me, H/CL 1.4. Suspect TTR cardiac amyloidosis.  - Echo (6/23): EF 50-55%, mild RV dilation with mildly decreased systolic function, PASP 35 mmHg, moderate-severe MR, aortic sclerosis without stenosis, moderate TR.  7. Anemia of chronic renal disease.  8. Breast cancer: Bilateral mastectomy.  9. GI bleeding: Admission in 8/21. EGD/c-scope in 6/21 negative.  10. Aortic stenosis: Mild on 2/22 echo, not present 6/23 echo.  11. Osteoporosis 12. COPD: History of prior smoking.  PFTs in 12/21 with mild obstruction.  13. TTR cardiac amyloidosis: Wild-type with negative [employer] gene testing.  14. Mitral regurgitation: moderate-severe 6/23 echo.   Social History   Socioeconomic History   Marital status: Married    Spouse name: [name]   Number of children: 1   Years of education: Not on file   Highest education level: Not on file  Occupational History   Occupation: retired  Tobacco Use   Smoking status: Former    Packs/day: 1.00    Years: 28.00    Total pack years: 28.00    Types: Cigarettes    Quit date: 07/17/1983    Years since quitting: 38.2   Smokeless tobacco: Never  Vaping Use   Vaping Use: Never used  Substance and Sexual Activity   Alcohol use: No   Drug use: No   Sexual activity: Not on file  Other Topics Concern   Not on file  Social History Narrative   Not on file   Social Determinants of Health   Financial Resource Strain: Not on file  Food Insecurity: No Food Insecurity (11/10/2019)   Hunger Vital Sign    Worried About Running Out of Food in the Last Year: Never true    Ran Out of Food in the Last Year: Never true  Transportation Needs: No Transportation Needs (04/27/2019)   PRAPARE - Hydrologist (Medical): No    Lack of Transportation (Non-Medical): No  Physical Activity: Not on file  Stress: Not on file  Social Connections: Moderately Integrated (11/10/2019)   Social Connection  and Isolation Panel [NHANES]    Frequency of Communication with Friends and Family: Once a week    Frequency of Social Gatherings with Friends and Family: Once a week    Attends Religious Services: 1 to 4 times per year    Active Member of [employer] or Organizations: No    Attends Music therapist: 1 to 4 times per year    Marital Status: Married  Human resources officer Violence: Not on file   Family History  Problem Relation Age of Onset   Cancer Mother    Aneurysm Father    Cancer Sister    Cancer Sister    Anesthesia problems Neg Hx    Hypotension Neg Hx    Malignant hyperthermia Neg Hx    Pseudochol deficiency Neg Hx    Colon cancer Neg Hx    ROS: All systems reviewed and negative except as per HPI.  Current Outpatient Medications  Medication Sig Dispense Refill   acetaminophen (TYLENOL) 650 MG CR tablet Take 650 mg by mouth every 8 (eight)  hours as needed for pain.     albuterol (VENTOLIN HFA) 108 (90 Base) MCG/ACT inhaler Inhale 1-2 puffs into the lungs every 6 (six) hours as needed for wheezing or shortness of breath. 18 g 3   ALPRAZolam (XANAX) 0.5 MG tablet Take 0.5 mg by mouth at bedtime.     apixaban (ELIQUIS) 2.5 MG TABS tablet Take 1 tablet (2.5 mg total) by mouth 2 (two) times daily. 180 tablet 3   bisoprolol (ZEBETA) 5 MG tablet TAKE 0.5 TABLETS (2.'5MG'$  TOTAL) BY MOUTH DAILY 45 tablet 3   budesonide (PULMICORT) 0.5 MG/2ML nebulizer solution Take 2 mLs (0.5 mg total) by nebulization 2 (two) times daily. (Patient not taking: Reported on 09/18/2021) 250 mL 5   budesonide-formoterol (SYMBICORT) 80-4.5 MCG/ACT inhaler Inhale 2 puffs into the lungs daily as needed (Asthma).     calcitRIOL (ROCALTROL) 0.25 MCG capsule Take 0.25 mcg by mouth every Monday, Wednesday, and Friday.     fluticasone (FLONASE) 50 MCG/ACT nasal spray Place 2 sprays into both nostrils as needed for allergies or rhinitis.     ipratropium (ATROVENT) 0.03 % nasal spray Place 2 sprays into both nostrils  in the morning, at noon, in the evening, and at bedtime. (Patient not taking: Reported on 09/18/2021) 30 mL 5   potassium chloride SA (KLOR-CON) 20 MEQ tablet Take 2 tablets (40 mEq total) by mouth daily. 180 tablet 3   Tafamidis (VYNDAMAX) 61 MG CAPS Take 1 capsule by mouth daily. 30 capsule 11   torsemide (DEMADEX) 20 MG tablet TAKE THREE TABLETS BY MOUTH TWICE A DAY. 540 tablet 3   No current facility-administered medications for this encounter.   BP 130/80   Pulse 79   Wt 55.6 kg (122 lb 9.6 oz)   SpO2 98%   BMI 22.42 kg/m  General: NAD Neck: JVP 8 cm, no thyromegaly or thyroid nodule.  Lungs: Clear to auscultation bilaterally with normal respiratory effort. CV: Nondisplaced PMI.  Heart irregular S1/S2, no S3/S4, 3/6 HSM apex.  No peripheral edema.  No carotid bruit.  Normal pedal pulses.  Abdomen: Soft, nontender, no hepatosplenomegaly, no distention.  Skin: Intact without lesions or rashes.  Neurologic: Alert and oriented x 3.  Psych: Normal affect. Extremities: No clubbing or cyanosis.  HEENT: Normal.   Assessment/Plan: 1. Chronic systolic => diastolic CHF:  Prominent RV failure.  Echo in 6/21 with EF 35-40% with mild RV dysfunction, large pericardial effusion without tamponade, moderate MR, moderate TR.  RHC in 8/21 with preserved cardiac output, primarily RV failure, mild pulmonary hypertension with low PVR. V/Q scan in 8/21 not suggestive of chronic PEs.  Echo in 10/21 with EF 50-55% with septal hypokinesis, mild RV enlargement with mildly decreased RV systolic function, PASP 44 mmHg, moderate MR with 2 eccentric jets, moderate-severe TR, moderate lateral pericardial effusion (improved from prior echo).  PYP scan in 11/21 was equivocal for TTR cardiac amyloidosis (grade 2 but H/CL < 1.5), [facility] gene testing was negative for TTR mutations.  Echo in 2/22 showed EF 60-65%, normal RV, moderate biatrial enlargement, moderate MR, mild AS, no pericardial effusion.  Repeat PYP scan in  11/22 was closely reviewed, think consistent with TTR cardiac amyloidosis (wild type).  Echo today showed EF 50-55%, mild RV dilation with mildly decreased systolic function, PASP 35 mmHg, moderate-severe MR, aortic sclerosis without stenosis, moderate TR. NYHA class II-III, she is not volume overloaded on exam.  - Continue torsemide 60 mg bid.  - She does not want to take SGLT2 inhibitor  due to UTI risk.     - Continue KCl 40 daily.    - Continue bisoprolol 2.5 mg daily.  - No digoxin, Entresto, spironolactone with improved LV EF and CKD stage IV.  - BMET/BNP today.  2. Atrial fibrillation: Chronic, has failed DCCV and Tikosyn.  She has not tolerated amiodarone.  Rate control good on bisoprolol 2.5 mg daily.  - Continue bisoprolol 2.5 daily.  - Continue Eliquis 2.5 mg bid.   3. Anemia: Multifactorial, suspect due to renal disease as well as GI bleeding.  Hgb down to 6.5 in 8/21 in setting of GI bleeding with FOBT+.  Hgb stable at 13.3 in 3/23.     - No blood products ([employer]).  - Following with hematology for Aranesp/feraheme.  4. CKD: Stage IV.  BMET today.   5. Pericardial effusion: Large on 6/21 echo without tamponade, echo in 10/21 showed pericardial effusion moderate laterally (smaller).  Echo in 2/22 showed resolution of pericardial effusion.  6. Cardiac amyloidosis: Wild-type TTR cardiac amyloidosis.  Invitae gene testing negative.  - Continue tafamidis.  7. Mitral regurgitation: Moderate-severe, eccentric on today's echo.  Possible atrial functional MR with severe LAE.   - I will arrange for TEE to more closely assess MR.  If severe, she may benefit from Mitraclip. Discussed risks/benefits and she agrees to procedure.   Followup 3 months   [name] 09/19/2021

## 2021-09-27 ENCOUNTER — Encounter (HOSPITAL_COMMUNITY): Payer: Self-pay | Admitting: Cardiology

## 2021-09-27 ENCOUNTER — Other Ambulatory Visit: Payer: Self-pay

## 2021-09-27 ENCOUNTER — Ambulatory Visit (HOSPITAL_COMMUNITY)
Admission: RE | Admit: 2021-09-27 | Discharge: 2021-09-27 | Disposition: A | Discharge status: Home / Self Care | Payer: Medicare Other | Attending: Cardiology | Admitting: Cardiology

## 2021-09-27 ENCOUNTER — Ambulatory Visit (HOSPITAL_BASED_OUTPATIENT_CLINIC_OR_DEPARTMENT_OTHER)
Admission: RE | Admit: 2021-09-27 | Discharge: 2021-09-27 | Disposition: A | Discharge status: Home / Self Care | Payer: Medicare Other | Source: Ambulatory Visit | Attending: Cardiology | Admitting: Cardiology

## 2021-09-27 ENCOUNTER — Ambulatory Visit (HOSPITAL_COMMUNITY): Payer: Medicare Other | Admitting: Anesthesiology

## 2021-09-27 ENCOUNTER — Encounter (HOSPITAL_COMMUNITY)
Admission: RE | Disposition: A | Discharge status: Home / Self Care | Payer: Self-pay | Source: Home / Self Care | Attending: Cardiology

## 2021-09-27 ENCOUNTER — Ambulatory Visit (HOSPITAL_BASED_OUTPATIENT_CLINIC_OR_DEPARTMENT_OTHER): Payer: Medicare Other | Admitting: Anesthesiology

## 2021-09-27 ENCOUNTER — Other Ambulatory Visit (HOSPITAL_COMMUNITY): Payer: Self-pay

## 2021-09-27 DIAGNOSIS — I5032 Chronic diastolic (congestive) heart failure: Secondary | ICD-10-CM

## 2021-09-27 DIAGNOSIS — Z7901 Long term (current) use of anticoagulants: Secondary | ICD-10-CM | POA: Diagnosis not present

## 2021-09-27 DIAGNOSIS — Z79899 Other long term (current) drug therapy: Secondary | ICD-10-CM | POA: Insufficient documentation

## 2021-09-27 DIAGNOSIS — I130 Hypertensive heart and chronic kidney disease with heart failure and stage 1 through stage 4 chronic kidney disease, or unspecified chronic kidney disease: Secondary | ICD-10-CM | POA: Diagnosis not present

## 2021-09-27 DIAGNOSIS — Z87891 Personal history of nicotine dependence: Secondary | ICD-10-CM | POA: Insufficient documentation

## 2021-09-27 DIAGNOSIS — I081 Rheumatic disorders of both mitral and tricuspid valves: Secondary | ICD-10-CM | POA: Diagnosis not present

## 2021-09-27 DIAGNOSIS — I340 Nonrheumatic mitral (valve) insufficiency: Secondary | ICD-10-CM | POA: Diagnosis not present

## 2021-09-27 DIAGNOSIS — I482 Chronic atrial fibrillation, unspecified: Secondary | ICD-10-CM | POA: Diagnosis not present

## 2021-09-27 DIAGNOSIS — I5042 Chronic combined systolic (congestive) and diastolic (congestive) heart failure: Secondary | ICD-10-CM | POA: Diagnosis not present

## 2021-09-27 DIAGNOSIS — N189 Chronic kidney disease, unspecified: Secondary | ICD-10-CM

## 2021-09-27 DIAGNOSIS — N184 Chronic kidney disease, stage 4 (severe): Secondary | ICD-10-CM | POA: Diagnosis not present

## 2021-09-27 DIAGNOSIS — I43 Cardiomyopathy in diseases classified elsewhere: Secondary | ICD-10-CM | POA: Diagnosis not present

## 2021-09-27 DIAGNOSIS — I509 Heart failure, unspecified: Secondary | ICD-10-CM

## 2021-09-27 HISTORY — PX: TEE WITHOUT CARDIOVERSION: SHX5443

## 2021-09-27 SURGERY — ECHOCARDIOGRAM, TRANSESOPHAGEAL
Anesthesia: Monitor Anesthesia Care

## 2021-09-27 MED ORDER — SODIUM CHLORIDE 0.9 % IV SOLN
INTRAVENOUS | Status: AC | PRN
  Administered 2021-09-27: 500 mL via INTRAVENOUS

## 2021-09-27 MED ORDER — SODIUM CHLORIDE 0.9 % IV SOLN: INTRAVENOUS | Status: DC

## 2021-09-27 MED ORDER — PROPOFOL 500 MG/50ML IV EMUL
INTRAVENOUS | Status: DC | PRN
  Administered 2021-09-27: 125 ug/kg/min via INTRAVENOUS

## 2021-09-27 MED ORDER — PHENYLEPHRINE 80 MCG/ML (10ML) SYRINGE FOR IV PUSH (FOR BLOOD PRESSURE SUPPORT)
PREFILLED_SYRINGE | INTRAVENOUS | Status: DC | PRN
  Administered 2021-09-27 (×2): 160 ug via INTRAVENOUS

## 2021-09-27 NOTE — Anesthesia Preprocedure Evaluation (Addendum)
Anesthesia Evaluation  Patient identified by MRN, date of birth, ID band Patient awake    Reviewed: Allergy & Precautions, NPO status , Patient's Chart, lab work & pertinent test results  Airway Mallampati: II  TM Distance: >3 FB Neck ROM: Full    Dental  (+) Edentulous Upper, Missing   Pulmonary asthma , COPD,  COPD inhaler, former smoker,    Pulmonary exam normal        Cardiovascular hypertension, +CHF  Normal cardiovascular exam+ dysrhythmias + Valvular Problems/Murmurs      Neuro/Psych Anxiety negative neurological ROS     GI/Hepatic negative GI ROS, Neg liver ROS,   Endo/Other  negative endocrine ROS  Renal/GU CRFRenal disease     Musculoskeletal  (+) Arthritis ,   Abdominal   Peds  Hematology  (+) Blood dyscrasia, ,   Anesthesia Other Findings MITRAL REGURGITATION  Reproductive/Obstetrics                            Anesthesia Physical Anesthesia Plan  ASA: 3  Anesthesia Plan: MAC   Post-op Pain Management:    Induction: Intravenous  PONV Risk Score and Plan: 2 and Propofol infusion and Treatment may vary due to age or medical condition  Airway Management Planned: Nasal Cannula  Additional Equipment:   Intra-op Plan:   Post-operative Plan:   Informed Consent: I have reviewed the patients History and Physical, chart, labs and discussed the procedure including the risks, benefits and alternatives for the proposed anesthesia with the patient or authorized representative who has indicated his/her understanding and acceptance.     Dental advisory given  Plan Discussed with: CRNA  Anesthesia Plan Comments:        Anesthesia Quick Evaluation

## 2021-09-27 NOTE — Transfer of Care (Signed)
Immediate Anesthesia Transfer of Care Note  Patient: Bailey Bishop  Procedure(s) Performed: TRANSESOPHAGEAL ECHOCARDIOGRAM (TEE)  Patient Location: Endoscopy Unit  Anesthesia Type:MAC  Level of Consciousness: drowsy and patient cooperative  Airway & Oxygen Therapy: Patient Spontanous Breathing and Patient connected to nasal cannula oxygen  Post-op Assessment: Report given to RN  Post vital signs: Reviewed and stable  Last Vitals:  Vitals Value Taken Time  BP 138/81 09/27/21 0938  Temp    Pulse 73 09/27/21 0939  Resp 17 09/27/21 0939  SpO2 98 % 09/27/21 0939  Vitals shown include unvalidated device data.  Last Pain:  Vitals:   09/27/21 0821  TempSrc: Temporal  PainSc: 0-No pain         Complications: No notable events documented.

## 2021-09-27 NOTE — Interval H&P Note (Signed)
History and Physical Interval Note:  09/27/2021 9:11 AM  Bailey Bishop  has presented today for surgery, with the diagnosis of [place].  The various methods of treatment have been discussed with the patient and family. After consideration of risks, benefits and other options for treatment, the patient has consented to  Procedure(s): TRANSESOPHAGEAL ECHOCARDIOGRAM (TEE) (N/A) as a surgical intervention.  The patient's history has been reviewed, patient examined, no change in status, stable for surgery.  I have reviewed the patient's chart and labs.  Questions were answered to the patient's satisfaction.     [name]

## 2021-09-27 NOTE — CV Procedure (Signed)
Procedure: TEE  Sedation: Per anesthesiology  Indication: Mitral regurgitation.   Findings: Please see echo section for full report.  Normal LV size and wall thickness with EF 55%.  Normal wall motion.  Normal right ventricular size and systolic function.  Severe left atrial enlargement, no LA appendage thrombus.  Moderate right atrial enlargement. No PFO/ASD by color doppler.  Moderate TR.  Trileaflet aortic valve with moderate calcification.  No AI, aortic sclerosis without stenosis.  There were 2 jets of MR, together there appears to be no more than moderate MR (ERO 0.13 by PISA for the larger jet, the other jet was small but highly eccentric).  No mitral stenosis.  Normal caliber thoracic aorta with moderate plaque.   Moderate mitral regurgitation.   Bailey Bishop 09/27/2021 9:42 AM

## 2021-09-27 NOTE — Anesthesia Procedure Notes (Signed)
Procedure Name: MAC Date/Time: 09/27/2021 9:01 AM  Performed by: Barrington Ellison, CRNAPre-anesthesia Checklist: Patient identified, Emergency Drugs available, Suction available, Patient being monitored and Timeout performed Patient Re-evaluated:Patient Re-evaluated prior to induction Oxygen Delivery Method: Nasal cannula

## 2021-09-27 NOTE — Progress Notes (Signed)
  Echocardiogram Echocardiogram Transesophageal has been performed.  Fidel Levy 09/27/2021, 9:59 AM

## 2021-09-27 NOTE — Anesthesia Postprocedure Evaluation (Signed)
Anesthesia Post Note  Patient: Bailey Bishop  Procedure(s) Performed: TRANSESOPHAGEAL ECHOCARDIOGRAM (TEE)     Patient location during evaluation: Endoscopy Anesthesia Type: MAC Level of consciousness: awake Pain management: pain level controlled Vital Signs Assessment: post-procedure vital signs reviewed and stable Respiratory status: spontaneous breathing, nonlabored ventilation, respiratory function stable and patient connected to nasal cannula oxygen Cardiovascular status: stable and blood pressure returned to baseline Postop Assessment: no apparent nausea or vomiting Anesthetic complications: no   No notable events documented.  Last Vitals:  Vitals:   09/27/21 1000 09/27/21 1009  BP: 109/61 122/75  Pulse: 73 75  Resp: 15 (!) 26  Temp:    SpO2: 96% 95%    Last Pain:  Vitals:   09/27/21 1009  TempSrc:   PainSc: 0-No pain                 [name]

## 2021-09-27 NOTE — Discharge Instructions (Signed)

## 2021-09-28 DIAGNOSIS — I129 Hypertensive chronic kidney disease with stage 1 through stage 4 chronic kidney disease, or unspecified chronic kidney disease: Secondary | ICD-10-CM | POA: Diagnosis not present

## 2021-09-28 DIAGNOSIS — N184 Chronic kidney disease, stage 4 (severe): Secondary | ICD-10-CM | POA: Diagnosis not present

## 2021-09-28 DIAGNOSIS — I5022 Chronic systolic (congestive) heart failure: Secondary | ICD-10-CM | POA: Diagnosis not present

## 2021-09-28 DIAGNOSIS — E211 Secondary hyperparathyroidism, not elsewhere classified: Secondary | ICD-10-CM | POA: Diagnosis not present

## 2021-10-02 ENCOUNTER — Other Ambulatory Visit (HOSPITAL_COMMUNITY): Payer: Self-pay

## 2021-10-05 DIAGNOSIS — I5022 Chronic systolic (congestive) heart failure: Secondary | ICD-10-CM | POA: Diagnosis not present

## 2021-10-05 DIAGNOSIS — E211 Secondary hyperparathyroidism, not elsewhere classified: Secondary | ICD-10-CM | POA: Diagnosis not present

## 2021-10-05 DIAGNOSIS — N184 Chronic kidney disease, stage 4 (severe): Secondary | ICD-10-CM | POA: Diagnosis not present

## 2021-10-05 DIAGNOSIS — I129 Hypertensive chronic kidney disease with stage 1 through stage 4 chronic kidney disease, or unspecified chronic kidney disease: Secondary | ICD-10-CM | POA: Diagnosis not present

## 2021-10-24 ENCOUNTER — Other Ambulatory Visit (HOSPITAL_COMMUNITY): Payer: Self-pay

## 2021-10-25 DIAGNOSIS — R0989 Other specified symptoms and signs involving the circulatory and respiratory systems: Secondary | ICD-10-CM | POA: Diagnosis not present

## 2021-10-25 DIAGNOSIS — R062 Wheezing: Secondary | ICD-10-CM | POA: Diagnosis not present

## 2021-10-25 DIAGNOSIS — R059 Cough, unspecified: Secondary | ICD-10-CM | POA: Diagnosis not present

## 2021-10-26 ENCOUNTER — Other Ambulatory Visit (HOSPITAL_COMMUNITY): Payer: Self-pay

## 2021-10-28 LAB — ECHO TEE
MV M vel: 4.37 m/s
MV Peak grad: 76.4 mmHg
Radius: 0.5 cm

## 2021-11-14 ENCOUNTER — Other Ambulatory Visit (HOSPITAL_COMMUNITY): Payer: Self-pay | Admitting: Cardiology

## 2021-11-23 ENCOUNTER — Other Ambulatory Visit (HOSPITAL_COMMUNITY): Payer: Self-pay

## 2021-11-30 ENCOUNTER — Other Ambulatory Visit (HOSPITAL_COMMUNITY): Payer: Self-pay

## 2021-12-08 ENCOUNTER — Inpatient Hospital Stay (HOSPITAL_COMMUNITY)
Admission: EM | Admit: 2021-12-08 | Discharge: 2021-12-24 | DRG: 308 | Disposition: E | Payer: Medicare Other | Attending: Pulmonary Disease | Admitting: Pulmonary Disease

## 2021-12-08 ENCOUNTER — Other Ambulatory Visit: Payer: Self-pay

## 2021-12-08 ENCOUNTER — Emergency Department (HOSPITAL_COMMUNITY): Payer: Medicare Other

## 2021-12-08 ENCOUNTER — Encounter (HOSPITAL_COMMUNITY): Payer: Self-pay

## 2021-12-08 ENCOUNTER — Encounter (HOSPITAL_COMMUNITY): Payer: Self-pay | Admitting: *Deleted

## 2021-12-08 DIAGNOSIS — Z87891 Personal history of nicotine dependence: Secondary | ICD-10-CM | POA: Diagnosis not present

## 2021-12-08 DIAGNOSIS — R739 Hyperglycemia, unspecified: Secondary | ICD-10-CM

## 2021-12-08 DIAGNOSIS — Z91018 Allergy to other foods: Secondary | ICD-10-CM

## 2021-12-08 DIAGNOSIS — G9341 Metabolic encephalopathy: Secondary | ICD-10-CM | POA: Diagnosis not present

## 2021-12-08 DIAGNOSIS — G931 Anoxic brain damage, not elsewhere classified: Secondary | ICD-10-CM | POA: Diagnosis not present

## 2021-12-08 DIAGNOSIS — I130 Hypertensive heart and chronic kidney disease with heart failure and stage 1 through stage 4 chronic kidney disease, or unspecified chronic kidney disease: Secondary | ICD-10-CM | POA: Diagnosis present

## 2021-12-08 DIAGNOSIS — R571 Hypovolemic shock: Secondary | ICD-10-CM | POA: Diagnosis not present

## 2021-12-08 DIAGNOSIS — R778 Other specified abnormalities of plasma proteins: Secondary | ICD-10-CM

## 2021-12-08 DIAGNOSIS — I472 Ventricular tachycardia, unspecified: Principal | ICD-10-CM | POA: Diagnosis present

## 2021-12-08 DIAGNOSIS — Z809 Family history of malignant neoplasm, unspecified: Secondary | ICD-10-CM

## 2021-12-08 DIAGNOSIS — R404 Transient alteration of awareness: Secondary | ICD-10-CM | POA: Diagnosis not present

## 2021-12-08 DIAGNOSIS — N179 Acute kidney failure, unspecified: Secondary | ICD-10-CM | POA: Diagnosis not present

## 2021-12-08 DIAGNOSIS — E876 Hypokalemia: Secondary | ICD-10-CM | POA: Diagnosis present

## 2021-12-08 DIAGNOSIS — I462 Cardiac arrest due to underlying cardiac condition: Secondary | ICD-10-CM | POA: Diagnosis not present

## 2021-12-08 DIAGNOSIS — Z7951 Long term (current) use of inhaled steroids: Secondary | ICD-10-CM

## 2021-12-08 DIAGNOSIS — Z531 Procedure and treatment not carried out because of patient's decision for reasons of belief and group pressure: Secondary | ICD-10-CM | POA: Diagnosis present

## 2021-12-08 DIAGNOSIS — J9601 Acute respiratory failure with hypoxia: Secondary | ICD-10-CM | POA: Diagnosis present

## 2021-12-08 DIAGNOSIS — Z743 Need for continuous supervision: Secondary | ICD-10-CM | POA: Diagnosis not present

## 2021-12-08 DIAGNOSIS — I469 Cardiac arrest, cause unspecified: Principal | ICD-10-CM | POA: Diagnosis present

## 2021-12-08 DIAGNOSIS — Z66 Do not resuscitate: Secondary | ICD-10-CM | POA: Diagnosis present

## 2021-12-08 DIAGNOSIS — D539 Nutritional anemia, unspecified: Secondary | ICD-10-CM | POA: Diagnosis present

## 2021-12-08 DIAGNOSIS — E874 Mixed disorder of acid-base balance: Secondary | ICD-10-CM | POA: Diagnosis not present

## 2021-12-08 DIAGNOSIS — R7989 Other specified abnormal findings of blood chemistry: Secondary | ICD-10-CM

## 2021-12-08 DIAGNOSIS — R54 Age-related physical debility: Secondary | ICD-10-CM | POA: Diagnosis not present

## 2021-12-08 DIAGNOSIS — N183 Chronic kidney disease, stage 3 unspecified: Secondary | ICD-10-CM | POA: Diagnosis present

## 2021-12-08 DIAGNOSIS — Z882 Allergy status to sulfonamides status: Secondary | ICD-10-CM

## 2021-12-08 DIAGNOSIS — N1832 Chronic kidney disease, stage 3b: Secondary | ICD-10-CM | POA: Diagnosis not present

## 2021-12-08 DIAGNOSIS — E872 Acidosis, unspecified: Secondary | ICD-10-CM

## 2021-12-08 DIAGNOSIS — R4020 Unspecified coma: Secondary | ICD-10-CM | POA: Diagnosis present

## 2021-12-08 DIAGNOSIS — D649 Anemia, unspecified: Secondary | ICD-10-CM

## 2021-12-08 DIAGNOSIS — I5033 Acute on chronic diastolic (congestive) heart failure: Secondary | ICD-10-CM | POA: Diagnosis present

## 2021-12-08 DIAGNOSIS — I4819 Other persistent atrial fibrillation: Secondary | ICD-10-CM | POA: Diagnosis not present

## 2021-12-08 DIAGNOSIS — Z888 Allergy status to other drugs, medicaments and biological substances status: Secondary | ICD-10-CM

## 2021-12-08 DIAGNOSIS — R7309 Other abnormal glucose: Secondary | ICD-10-CM

## 2021-12-08 DIAGNOSIS — J449 Chronic obstructive pulmonary disease, unspecified: Secondary | ICD-10-CM | POA: Diagnosis present

## 2021-12-08 DIAGNOSIS — N289 Disorder of kidney and ureter, unspecified: Secondary | ICD-10-CM

## 2021-12-08 DIAGNOSIS — Z4682 Encounter for fitting and adjustment of non-vascular catheter: Secondary | ICD-10-CM | POA: Diagnosis not present

## 2021-12-08 DIAGNOSIS — M1990 Unspecified osteoarthritis, unspecified site: Secondary | ICD-10-CM | POA: Diagnosis not present

## 2021-12-08 DIAGNOSIS — Z515 Encounter for palliative care: Secondary | ICD-10-CM | POA: Diagnosis not present

## 2021-12-08 DIAGNOSIS — I499 Cardiac arrhythmia, unspecified: Secondary | ICD-10-CM | POA: Diagnosis not present

## 2021-12-08 DIAGNOSIS — R579 Shock, unspecified: Secondary | ICD-10-CM | POA: Diagnosis present

## 2021-12-08 DIAGNOSIS — Z79899 Other long term (current) drug therapy: Secondary | ICD-10-CM

## 2021-12-08 DIAGNOSIS — K219 Gastro-esophageal reflux disease without esophagitis: Secondary | ICD-10-CM | POA: Diagnosis present

## 2021-12-08 DIAGNOSIS — Z853 Personal history of malignant neoplasm of breast: Secondary | ICD-10-CM

## 2021-12-08 DIAGNOSIS — R0602 Shortness of breath: Secondary | ICD-10-CM | POA: Diagnosis not present

## 2021-12-08 DIAGNOSIS — N19 Unspecified kidney failure: Secondary | ICD-10-CM | POA: Diagnosis present

## 2021-12-08 DIAGNOSIS — R092 Respiratory arrest: Secondary | ICD-10-CM | POA: Diagnosis not present

## 2021-12-08 DIAGNOSIS — Z7901 Long term (current) use of anticoagulants: Secondary | ICD-10-CM

## 2021-12-08 DIAGNOSIS — D72829 Elevated white blood cell count, unspecified: Secondary | ICD-10-CM | POA: Diagnosis present

## 2021-12-08 LAB — CBC WITH DIFFERENTIAL/PLATELET
Abs Immature Granulocytes: 0.6 10*3/uL — ABNORMAL HIGH (ref 0.00–0.07)
Band Neutrophils: 2 %
Basophils Absolute: 0 10*3/uL (ref 0.0–0.1)
Basophils Relative: 0 %
Eosinophils Absolute: 1.2 10*3/uL — ABNORMAL HIGH (ref 0.0–0.5)
Eosinophils Relative: 8 %
HCT: 37.7 % (ref 36.0–46.0)
Hemoglobin: 11.3 g/dL — ABNORMAL LOW (ref 12.0–15.0)
Lymphocytes Relative: 51 %
Lymphs Abs: 7.8 10*3/uL — ABNORMAL HIGH (ref 0.7–4.0)
MCH: 32.2 pg (ref 26.0–34.0)
MCHC: 30 g/dL (ref 30.0–36.0)
MCV: 107.4 fL — ABNORMAL HIGH (ref 80.0–100.0)
Metamyelocytes Relative: 3 %
Monocytes Absolute: 0.3 10*3/uL (ref 0.1–1.0)
Monocytes Relative: 2 %
Myelocytes: 1 %
Neutro Abs: 5.4 10*3/uL (ref 1.7–7.7)
Neutrophils Relative %: 33 %
Platelets: 184 10*3/uL (ref 150–400)
RBC: 3.51 MIL/uL — ABNORMAL LOW (ref 3.87–5.11)
RDW: 12.7 % (ref 11.5–15.5)
WBC: 15.3 10*3/uL — ABNORMAL HIGH (ref 4.0–10.5)
nRBC: 0.3 % — ABNORMAL HIGH (ref 0.0–0.2)

## 2021-12-08 LAB — GLUCOSE, CAPILLARY
Glucose-Capillary: 166 mg/dL — ABNORMAL HIGH (ref 70–99)
Glucose-Capillary: 209 mg/dL — ABNORMAL HIGH (ref 70–99)
Glucose-Capillary: 263 mg/dL — ABNORMAL HIGH (ref 70–99)
Glucose-Capillary: 269 mg/dL — ABNORMAL HIGH (ref 70–99)

## 2021-12-08 LAB — BLOOD GAS, ARTERIAL
Acid-base deficit: 19 mmol/L — ABNORMAL HIGH (ref 0.0–2.0)
Acid-base deficit: 7.5 mmol/L — ABNORMAL HIGH (ref 0.0–2.0)
Bicarbonate: 12.5 mmol/L — ABNORMAL LOW (ref 20.0–28.0)
Bicarbonate: 19.3 mmol/L — ABNORMAL LOW (ref 20.0–28.0)
Drawn by: 27407
Drawn by: 22179
FIO2: 100 %
FIO2: 60 %
O2 Saturation: 100 %
O2 Saturation: 99.3 %
Patient temperature: 34.9
Patient temperature: 35
pCO2 arterial: 47 mmHg (ref 32–48)
pCO2 arterial: 39 mmHg (ref 32–48)
pH, Arterial: 7.02 — CL (ref 7.35–7.45)
pH, Arterial: 7.29 — ABNORMAL LOW (ref 7.35–7.45)
pO2, Arterial: 441 mmHg — ABNORMAL HIGH (ref 83–108)
pO2, Arterial: 221 mmHg — ABNORMAL HIGH (ref 83–108)

## 2021-12-08 LAB — LACTIC ACID, PLASMA
Lactic Acid, Venous: 4.3 mmol/L (ref 0.5–1.9)
Lactic Acid, Venous: 4.1 mmol/L (ref 0.5–1.9)

## 2021-12-08 LAB — MRSA NEXT GEN BY PCR, NASAL: MRSA by PCR Next Gen: NOT DETECTED

## 2021-12-08 LAB — TROPONIN I (HIGH SENSITIVITY)
Troponin I (High Sensitivity): 39 ng/L — ABNORMAL HIGH (ref <18)
Troponin I (High Sensitivity): 129 ng/L (ref <18)
Troponin I (High Sensitivity): 318 ng/L (ref <18)
Troponin I (High Sensitivity): 325 ng/L (ref <18)

## 2021-12-08 LAB — COMPREHENSIVE METABOLIC PANEL
ALT: 17 U/L (ref 0–44)
ALT: 23 U/L (ref 0–44)
AST: 33 U/L (ref 15–41)
AST: 52 U/L — ABNORMAL HIGH (ref 15–41)
Albumin: 3 g/dL — ABNORMAL LOW (ref 3.5–5.0)
Albumin: 2.9 g/dL — ABNORMAL LOW (ref 3.5–5.0)
Alkaline Phosphatase: 95 U/L (ref 38–126)
Alkaline Phosphatase: 73 U/L (ref 38–126)
Anion gap: 13 (ref 5–15)
Anion gap: 16 — ABNORMAL HIGH (ref 5–15)
BUN: 31 mg/dL — ABNORMAL HIGH (ref 8–23)
BUN: 44 mg/dL — ABNORMAL HIGH (ref 8–23)
CO2: 18 mmol/L — ABNORMAL LOW (ref 22–32)
CO2: 17 mmol/L — ABNORMAL LOW (ref 22–32)
Calcium: 7.9 mg/dL — ABNORMAL LOW (ref 8.9–10.3)
Calcium: 7.9 mg/dL — ABNORMAL LOW (ref 8.9–10.3)
Chloride: 111 mmol/L (ref 98–111)
Chloride: 107 mmol/L (ref 98–111)
Creatinine, Ser: 2.07 mg/dL — ABNORMAL HIGH (ref 0.44–1.00)
Creatinine, Ser: 2.93 mg/dL — ABNORMAL HIGH (ref 0.44–1.00)
GFR, Estimated: 23 mL/min — ABNORMAL LOW (ref >60)
GFR, Estimated: 15 mL/min — ABNORMAL LOW (ref >60)
Glucose, Bld: 262 mg/dL — ABNORMAL HIGH (ref 70–99)
Glucose, Bld: 248 mg/dL — ABNORMAL HIGH (ref 70–99)
Potassium: 4.6 mmol/L (ref 3.5–5.1)
Potassium: 4.1 mmol/L (ref 3.5–5.1)
Sodium: 142 mmol/L (ref 135–145)
Sodium: 140 mmol/L (ref 135–145)
Total Bilirubin: 0.5 mg/dL (ref 0.3–1.2)
Total Bilirubin: 0.6 mg/dL (ref 0.3–1.2)
Total Protein: 5.8 g/dL — ABNORMAL LOW (ref 6.5–8.1)
Total Protein: 5.7 g/dL — ABNORMAL LOW (ref 6.5–8.1)

## 2021-12-08 LAB — POCT I-STAT 7, (LYTES, BLD GAS, ICA,H+H)
Acid-base deficit: 5 mmol/L — ABNORMAL HIGH (ref 0.0–2.0)
Bicarbonate: 20.1 mmol/L (ref 20.0–28.0)
Calcium, Ion: 1.05 mmol/L — ABNORMAL LOW (ref 1.15–1.40)
HCT: 37 % (ref 36.0–46.0)
Hemoglobin: 12.6 g/dL (ref 12.0–15.0)
O2 Saturation: 100 %
Patient temperature: 35.9
Potassium: 3.9 mmol/L (ref 3.5–5.1)
Sodium: 140 mmol/L (ref 135–145)
TCO2: 21 mmol/L — ABNORMAL LOW (ref 22–32)
pCO2 arterial: 34.3 mmHg (ref 32–48)
pH, Arterial: 7.37 (ref 7.35–7.45)
pO2, Arterial: 178 mmHg — ABNORMAL HIGH (ref 83–108)

## 2021-12-08 LAB — PROCALCITONIN: Procalcitonin: 4.46 ng/mL

## 2021-12-08 LAB — HEMOGLOBIN A1C
Hgb A1c MFr Bld: 4.7 % — ABNORMAL LOW (ref 4.8–5.6)
Mean Plasma Glucose: 88.19 mg/dL

## 2021-12-08 LAB — CBG MONITORING, ED: Glucose-Capillary: 220 mg/dL — ABNORMAL HIGH (ref 70–99)

## 2021-12-08 LAB — HEPARIN LEVEL (UNFRACTIONATED): Heparin Unfractionated: >1.1 IU/mL — ABNORMAL HIGH (ref 0.30–0.70)

## 2021-12-08 LAB — APTT: aPTT: 45 seconds — ABNORMAL HIGH (ref 24–36)

## 2021-12-08 LAB — BRAIN NATRIURETIC PEPTIDE: B Natriuretic Peptide: 586.6 pg/mL — ABNORMAL HIGH (ref 0.0–100.0)

## 2021-12-08 MED ORDER — AMIODARONE LOAD VIA INFUSION
150.0000 mg | Freq: Once | INTRAVENOUS | Status: DC
  Filled 2021-12-08: qty 83.34

## 2021-12-08 MED ORDER — SODIUM CHLORIDE 0.9 % IV SOLN
250.0000 mL | INTRAVENOUS | Status: DC
  Administered 2021-12-08: 250 mL via INTRAVENOUS

## 2021-12-08 MED ORDER — FENTANYL CITRATE PF 50 MCG/ML IJ SOSY
25.0000 ug | PREFILLED_SYRINGE | INTRAMUSCULAR | Status: DC | PRN
  Administered 2021-12-08: 50 ug via INTRAVENOUS
  Administered 2021-12-08 (×2): 100 ug via INTRAVENOUS
  Administered 2021-12-08 – 2021-12-09 (×2): 50 ug via INTRAVENOUS
  Filled 2021-12-08: qty 1
  Filled 2021-12-08 (×2): qty 2
  Filled 2021-12-08 (×3): qty 1

## 2021-12-08 MED ORDER — METHYLPREDNISOLONE SODIUM SUCC 125 MG IJ SOLR
125.0000 mg | Freq: Once | INTRAMUSCULAR | Status: AC
  Administered 2021-12-08: 125 mg via INTRAVENOUS
  Filled 2021-12-08: qty 2

## 2021-12-08 MED ORDER — AMIODARONE HCL IN DEXTROSE 360-4.14 MG/200ML-% IV SOLN
60.0000 mg/h | INTRAVENOUS | Status: AC
  Administered 2021-12-08: 60 mg/h via INTRAVENOUS
  Filled 2021-12-08: qty 200

## 2021-12-08 MED ORDER — PANTOPRAZOLE 2 MG/ML SUSPENSION: 40.0000 mg | Freq: Every day | ORAL | Status: DC

## 2021-12-08 MED ORDER — ACETAMINOPHEN 160 MG/5ML PO SOLN
650.0000 mg | ORAL | Status: DC | PRN
  Administered 2021-12-08: 650 mg
  Filled 2021-12-08: qty 20.3

## 2021-12-08 MED ORDER — AMIODARONE HCL IN DEXTROSE 360-4.14 MG/200ML-% IV SOLN
30.0000 mg/h | INTRAVENOUS | Status: DC
  Administered 2021-12-08 – 2021-12-09 (×2): 30 mg/h via INTRAVENOUS
  Filled 2021-12-08 (×2): qty 200

## 2021-12-08 MED ORDER — DOCUSATE SODIUM 50 MG/5ML PO LIQD
100.0000 mg | Freq: Two times a day (BID) | ORAL | Status: DC
  Administered 2021-12-08: 100 mg
  Filled 2021-12-08: qty 10

## 2021-12-08 MED ORDER — MIDAZOLAM HCL 2 MG/2ML IJ SOLN: 1.0000 mg | INTRAMUSCULAR | Status: DC | PRN

## 2021-12-08 MED ORDER — PROPOFOL 1000 MG/100ML IV EMUL
5.0000 ug/kg/min | INTRAVENOUS | Status: DC
  Administered 2021-12-08: 5 ug/kg/min via INTRAVENOUS
  Filled 2021-12-08: qty 100

## 2021-12-08 MED ORDER — INSULIN ASPART 100 UNIT/ML IJ SOLN
0.0000 [IU] | INTRAMUSCULAR | Status: DC
  Administered 2021-12-08: 3 [IU] via SUBCUTANEOUS
  Administered 2021-12-08: 2 [IU] via SUBCUTANEOUS
  Administered 2021-12-08: 1 [IU] via SUBCUTANEOUS

## 2021-12-08 MED ORDER — CHLORHEXIDINE GLUCONATE CLOTH 2 % EX PADS: 6.0000 | MEDICATED_PAD | Freq: Every day | CUTANEOUS | Status: DC

## 2021-12-08 MED ORDER — POLYETHYLENE GLYCOL 3350 17 G PO PACK: 17.0000 g | PACK | Freq: Every day | ORAL | Status: DC

## 2021-12-08 MED ORDER — IPRATROPIUM-ALBUTEROL 0.5-2.5 (3) MG/3ML IN SOLN
3.0000 mL | RESPIRATORY_TRACT | Status: DC
  Administered 2021-12-08 – 2021-12-09 (×6): 3 mL via RESPIRATORY_TRACT
  Filled 2021-12-08 (×6): qty 3

## 2021-12-08 MED ORDER — EPINEPHRINE 1 MG/10ML IJ SOSY
PREFILLED_SYRINGE | INTRAMUSCULAR | Status: AC | PRN
  Administered 2021-12-08: 1 mg via INTRAVENOUS

## 2021-12-08 MED ORDER — LEVETIRACETAM IN NACL 500 MG/100ML IV SOLN: 500.0000 mg | Freq: Two times a day (BID) | INTRAVENOUS | Status: DC

## 2021-12-08 MED ORDER — LACTATED RINGERS IV BOLUS
1000.0000 mL | Freq: Once | INTRAVENOUS | Status: AC
  Administered 2021-12-08: 1000 mL via INTRAVENOUS

## 2021-12-08 MED ORDER — POLYETHYLENE GLYCOL 3350 17 G PO PACK: 17.0000 g | PACK | Freq: Every day | ORAL | Status: DC | PRN

## 2021-12-08 MED ORDER — ASPIRIN 81 MG PO CHEW
81.0000 mg | CHEWABLE_TABLET | Freq: Every day | ORAL | Status: DC
  Administered 2021-12-08: 81 mg
  Filled 2021-12-08: qty 1

## 2021-12-08 MED ORDER — PANTOPRAZOLE 2 MG/ML SUSPENSION
40.0000 mg | Freq: Every day | ORAL | Status: DC
  Administered 2021-12-08: 40 mg
  Filled 2021-12-08: qty 20

## 2021-12-08 MED ORDER — MIDAZOLAM HCL 2 MG/2ML IJ SOLN
1.0000 mg | INTRAMUSCULAR | Status: AC | PRN
  Administered 2021-12-08 (×3): 1 mg via INTRAVENOUS
  Filled 2021-12-08 (×3): qty 2

## 2021-12-08 MED ORDER — NOREPINEPHRINE 4 MG/250ML-% IV SOLN
INTRAVENOUS | Status: AC
  Filled 2021-12-08: qty 250

## 2021-12-08 MED ORDER — EPINEPHRINE 1 MG/10ML IJ SOSY
PREFILLED_SYRINGE | INTRAMUSCULAR | Status: AC
  Filled 2021-12-08: qty 10

## 2021-12-08 MED ORDER — NOREPINEPHRINE 4 MG/250ML-% IV SOLN
4.0000 ug/min | INTRAVENOUS | Status: DC
  Administered 2021-12-08: 10 ug/min via INTRAVENOUS

## 2021-12-08 MED ORDER — FENTANYL CITRATE PF 50 MCG/ML IJ SOSY: 25.0000 ug | PREFILLED_SYRINGE | INTRAMUSCULAR | Status: DC | PRN

## 2021-12-08 MED ORDER — ORAL CARE MOUTH RINSE: 15.0000 mL | OROMUCOSAL | Status: DC | PRN

## 2021-12-08 MED ORDER — LIDOCAINE BOLUS VIA INFUSION
75.0000 mg | Freq: Once | INTRAVENOUS | Status: AC
  Administered 2021-12-08: 75 mg via INTRAVENOUS
  Filled 2021-12-08: qty 76

## 2021-12-08 MED ORDER — ALBUTEROL SULFATE (2.5 MG/3ML) 0.083% IN NEBU
INHALATION_SOLUTION | RESPIRATORY_TRACT | Status: AC
  Filled 2021-12-08: qty 6

## 2021-12-08 MED ORDER — SODIUM BICARBONATE 8.4 % IV SOLN
50.0000 meq | Freq: Once | INTRAVENOUS | Status: AC
  Administered 2021-12-08: 50 meq via INTRAVENOUS

## 2021-12-08 MED ORDER — LIDOCAINE IN D5W 4-5 MG/ML-% IV SOLN
2.0000 mg/min | INTRAVENOUS | Status: DC
  Administered 2021-12-08: 2 mg/min via INTRAVENOUS

## 2021-12-08 MED ORDER — LEVETIRACETAM IN NACL 1000 MG/100ML IV SOLN
1000.0000 mg | Freq: Once | INTRAVENOUS | Status: AC
  Administered 2021-12-08: 1000 mg via INTRAVENOUS
  Filled 2021-12-08: qty 100

## 2021-12-08 MED ORDER — SODIUM BICARBONATE 8.4 % IV SOLN
INTRAVENOUS | Status: DC
  Filled 2021-12-08 (×2): qty 1000

## 2021-12-08 MED ORDER — DOCUSATE SODIUM 100 MG PO CAPS: 100.0000 mg | ORAL_CAPSULE | Freq: Two times a day (BID) | ORAL | Status: DC | PRN

## 2021-12-08 MED ORDER — AMIODARONE HCL 150 MG/3ML IV SOLN
INTRAVENOUS | Status: AC
  Filled 2021-12-08: qty 3

## 2021-12-08 MED ORDER — ORAL CARE MOUTH RINSE
15.0000 mL | OROMUCOSAL | Status: DC
  Administered 2021-12-08 – 2021-12-09 (×7): 15 mL via OROMUCOSAL

## 2021-12-08 MED ORDER — HEPARIN (PORCINE) 25000 UT/250ML-% IV SOLN
550.0000 [IU]/h | INTRAVENOUS | Status: DC
  Administered 2021-12-08: 400 [IU]/h via INTRAVENOUS
  Filled 2021-12-08: qty 250

## 2021-12-08 MED ORDER — NOREPINEPHRINE 4 MG/250ML-% IV SOLN
2.0000 ug/min | INTRAVENOUS | Status: DC
  Administered 2021-12-08 – 2021-12-09 (×2): 5 ug/min via INTRAVENOUS
  Filled 2021-12-08 (×2): qty 250

## 2021-12-08 MED ORDER — MIDAZOLAM HCL 5 MG/5ML IJ SOLN
5.0000 mg | Freq: Once | INTRAMUSCULAR | Status: DC
  Filled 2021-12-08: qty 5

## 2021-12-08 NOTE — H&P (Signed)
NAME:  Bailey Bishop, MRN:  [id number], DOB:  18-Mar-1941, LOS: 0 ADMISSION DATE:  12/15/2021, CONSULTATION DATE:  9/15 REFERRING MD:  glick, CHIEF COMPLAINT:  cardiac arrest   History of Present Illness:  This is an 81 year old female patient with history of CKD, atrial fibrillation on DOAC And congestive heart failure.  Per review of medical records had been complaining of worsening shortness of breath for approximately 3 to 4 days using home dilator without improvement.  The patient was at home, once again using her nebulizer device the morning of 9/15 when she suddenly collapsed, as witnessed by her husband.  He immediately called EMS, arrival was estimated at 5 to 10 minutes.  On arrival she was found to be pulseless.  A King airway was placed, ACLS was initiated, and estimated 30 minutes of CPR was administered prior to return of spontaneous circulation.Marland Kitchen  She was brought to [facility], and subsequently transferred to [facility] on arrival  Significant Hospital Events: Including procedures, antibiotic start and stop dates in addition to other pertinent events   Arrived at [facility] on 9/15 initial exam: Would open eyes and decerebrate posture with noxious stimulus only.  On arrival she was on 5 mcg/min norepinephrine.  Spoke with husband at bedside confirmed DO NOT RESUSCITATE status, with plan to support for 48 hours to see if there was clinical improvement  Interim History / Subjective:  Comatose, currently weaning pressors  Objective   Blood pressure 119/66, pulse 68, temperature (Abnormal) 95.7 F (35.4 C), resp. rate 17, height '5\' 2"'$  (1.575 m), weight 55.6 kg, SpO2 100 %.    Vent Mode: PRVC FiO2 (%):  [60 %-100 %] 60 % Set Rate:  [18 bmp] 18 bmp Vt Set:  [400 mL] 400 mL PEEP:  [5 cmH20] 5 cmH20 Plateau Pressure:  [21 cmH20-31 cmH20] 23 cmH20   Intake/Output Summary (Last 24 hours) at 12/02/2021 1128 Last data filed at 11/30/2021 3845 Gross per 24 hour   Intake 1000 ml  Output no documentation  Net 1000 ml   Filed Weights   12/11/2021 0522  Weight: 55.6 kg    Examination: General: Frail-appearing 81 year old female currently on full ventilator support HENT: Normocephalic atraumatic pupils irregular but reactive left greater than right Lungs: Scattered rhonchi. Portable chest x-ray personally reviewed Endotracheal tube in satisfactory position orogastric tube below diaphragm, no clear pulmonary edema Cardiovascular: Regular irregular with atrial fibrillation on telemetry Abdomen: Soft Extremities: Cool no significant edema pulses palpable Neuro: Comatose, eyes will open, and noting decerebrate posturing primarily in the legs with noxious stimulus such as suction and cough GU: Clear yellow urine  Resolved Hospital Problem list     Assessment & Plan:  Cardiac arrest, VT H/o HFpEF Plan  Continue telemetry monitoring IV amiodarone instead of lidocaine noting prior nausea and vomiting with amnio Via tube aspirin IV heparin in lieu of DOAC Cycle cardiac enzymes Consider echocardiogram depending on clinical course DO NOT RESUSCITATE, no escalation in pressors  Acute hypoxic respiratory failure following cardiac arrest, superimposed on probable underlying COPD Plan Continuing full Ventilator support Scheduled bronchodilators VAP bundle PAD protocol RASS goal 0 to -1  acute metabolic encephalopathy/probable anoxic brain injury -Initial CT of the head negative, there was chronic microvascular changes however on presentation she is exhibiting decerebrate posturing Plan EEG Avoid fever Supportive care Reevaluate in 48 hours postadmission, considering possible MRI  History of chronic atrial fibrillation on DOAC Plan Continuing telemetry IV heparin Amiodarone as above  Acute  on chronic renal failure with stage IV CKD Looks like baseline creatinine in the 1.8-1.9 range Plan Continuing gentle IV hydration Renal dose  medications Weaning norepinephrine  None anion gap metabolic acidosis Plan Maintenance IV fluids with D5 water, 3 Amp of bicarbonate Serial chemistries Once bicarbonate level greater than 20 transition to LR  Hyperglycemia Plan Sliding scale insulin  Mild leukocytosis Suspect reactive Plan Follow-up CBC Check procalcitonin  Mild anemia Plan Trend CBC   Best Practice (right click and "Reselect all SmartList Selections" daily)   Diet/type: NPO DVT prophylaxis: systemic heparin GI prophylaxis: N/A Lines: N/A Foley:  Yes, and it is still needed Code Status:  DNR Last date of multidisciplinary goals of care discussion [care discussed at bedside with her husband and son.  They agreed to DO NOT RESUSCITATE status.  They would like to continue current supportive measures for 48 hours in hopes of clinical improvement.  They understand she may not survive.  We will not escalate care further beyond what she is receiving.]  Labs   CBC: Recent Labs  Lab 11/24/2021 0446  WBC 15.3*  NEUTROABS 5.4  HGB 11.3*  HCT 37.7  MCV 107.4*  PLT 992    Basic Metabolic Panel: Recent Labs  Lab 12/01/2021 0455  NA 142  K 4.6  CL 111  CO2 18*  GLUCOSE 262*  BUN 31*  CREATININE 2.07*  CALCIUM 7.9*   GFR: Estimated Creatinine Clearance: 16.3 mL/min (A) (by C-G formula based on SCr of 2.07 mg/dL (H)). Recent Labs  Lab 12/01/2021 0446  WBC 15.3*    Liver Function Tests: Recent Labs  Lab 12/19/2021 0455  AST 33  ALT 17  ALKPHOS 95  BILITOT 0.5  PROT 5.8*  ALBUMIN 3.0*   No results for input(s): "LIPASE", "AMYLASE" in the last 168 hours. No results for input(s): "AMMONIA" in the last 168 hours.  ABG    Component Value Date/Time   PHART 7.29 (L) 12/22/2021 0846   PCO2ART 39 12/11/2021 0846   PO2ART 221 (H) 12/14/2021 0846   HCO3 19.3 (L) 12/01/2021 0846   TCO2 34 (H) 11/09/2019 1530   ACIDBASEDEF 7.5 (H) 12/14/2021 0846   O2SAT 99.3 11/29/2021 0846     Coagulation  Profile: No results for input(s): "INR", "PROTIME" in the last 168 hours.  Cardiac Enzymes: No results for input(s): "CKTOTAL", "CKMB", "CKMBINDEX", "TROPONINI" in the last 168 hours.  HbA1C: Hgb A1c MFr Bld  Date/Time Value Ref Range Status  10/08/2019 01:54 PM 5.4 4.8 - 5.6 % Final    Comment:    (NOTE) Pre diabetes:          5.7%-6.4%  Diabetes:              >6.4%  Glycemic control for   <7.0% adults with diabetes   09/23/2016 01:44 PM 4.9 4.8 - 5.6 % Final    Comment:    (NOTE)         Pre-diabetes: 5.7 - 6.4         Diabetes: >6.4         Glycemic control for adults with diabetes: <7.0     CBG: Recent Labs  Lab 12/10/2021 0448 12/15/2021 1112  GLUCAP 220* 269*    Review of Systems:   Not able  Past Medical History:  She,  has a past medical history of Anemia, Anxiety, Arthritis, Asthma, Atrial fibrillation ([place]), Breast cancer ([place]) (2001), CHF (congestive heart failure) ([place]), Chronic kidney disease, Essential hypertension, GERD (gastroesophageal reflux disease), and  T10 vertebral fracture ([place]).   Surgical History:   Past Surgical History:  Procedure Laterality Date   BIOPSY N/A 04/22/2018   Procedure: BIOPSY;  Surgeon: Aviva Signs, MD;  [facility];  Service: Gastroenterology;  Laterality: N/A;   BIOPSY  09/06/2019   Procedure: BIOPSY;  Surgeon: Daneil Dolin, MD;  [facility];  Service: Endoscopy;;  gastric   CARDIOVERSION N/A 02/18/2017   Procedure: CARDIOVERSION;  Surgeon: Josue Hector, MD;  Location[facility];  Service: Cardiovascular;  Laterality: N/A;   CARDIOVERSION N/A 12/04/2017   Procedure: CARDIOVERSION;  Surgeon: [name], MD;  Location: [place];  Service: Cardiovascular;  Laterality: N/A;   CATARACT EXTRACTION W/PHACO  07/23/2011   Procedure: CATARACT EXTRACTION PHACO AND INTRAOCULAR LENS PLACEMENT ([place]);  Surgeon: Williams Che, MD;  Location: [facility];  Service: Ophthalmology;  Laterality: Right;   CDE:9.96   CATARACT EXTRACTION W/PHACO Left 11/13/2018   Procedure: CATARACT EXTRACTION PHACO AND INTRAOCULAR LENS PLACEMENT (IOC);  Surgeon: Baruch Goldmann, MD;  Location: [facility];  Service: Ophthalmology;  Laterality: Left;  left, CDE: 7.44   CHOLECYSTECTOMY N/A 02/14/2015   Procedure: LAPAROSCOPIC CHOLECYSTECTOMY;  Surgeon: Aviva Signs, MD;  Location: [facility];  Service: General;  Laterality: N/A;   COLONOSCOPY N/A 04/22/2018   Procedure: COLONOSCOPY;  Surgeon: Aviva Signs, MD;  [facility];  Service: Gastroenterology;  Laterality: N/A;   COLONOSCOPY WITH PROPOFOL N/A 09/06/2019   Procedure: COLONOSCOPY WITH PROPOFOL;  Surgeon: Daneil Dolin, MD;  [facility];  Service: Endoscopy;  Laterality: N/A;   ESOPHAGEAL DILATION  09/06/2019   Procedure: ESOPHAGEAL DILATION;  Surgeon: Daneil Dolin, MD;  [facility];  Service: Endoscopy;;   ESOPHAGOGASTRODUODENOSCOPY N/A 04/22/2018   Procedure: ESOPHAGOGASTRODUODENOSCOPY (EGD);  Surgeon: Aviva Signs, MD;  [facility];  Service: Gastroenterology;  Laterality: N/A;   ESOPHAGOGASTRODUODENOSCOPY (EGD) WITH PROPOFOL N/A 09/06/2019   Procedure: ESOPHAGOGASTRODUODENOSCOPY (EGD) WITH PROPOFOL;  Surgeon: Daneil Dolin, MD;  [facility];  Service: Endoscopy;  Laterality: N/A;   GIVENS CAPSULE STUDY N/A 10/31/2019   Procedure: GIVENS CAPSULE STUDY;  Surgeon: Harvel Quale, MD;  [facility];  Service: Gastroenterology;  Laterality: N/A;   MASTECTOMY     bilateral mastectomy-right breast cancer-left taken by choice   POLYPECTOMY  04/22/2018   Procedure: POLYPECTOMY;  Surgeon: Aviva Signs, MD;  [facility];  Service: Gastroenterology;;   POLYPECTOMY  09/06/2019   Procedure: POLYPECTOMY;  Surgeon: Daneil Dolin, MD;  [facility];  Service: Endoscopy;;  cecal, ascending, hepatic flexure, sigmoid   RIGHT HEART CATH N/A 11/09/2019   Procedure: RIGHT HEART  CATH;  Surgeon: Jolaine Artist, MD;  Location: [facility];  Service: Cardiovascular;  Laterality: N/A;   SEPTOPLASTY  1995   TEE WITHOUT CARDIOVERSION N/A 09/27/2021   Procedure: TRANSESOPHAGEAL ECHOCARDIOGRAM (TEE);  Surgeon: Larey Dresser, MD;  Location: [facility] ENDOSCOPY;  Service: Cardiovascular;  Laterality: N/A;     Social History:   reports that she quit smoking about 38 years ago. Her smoking use included cigarettes. She has a 28.00 pack-year smoking history. She has never used smokeless tobacco. She reports that she does not drink alcohol and does not use drugs.   Family History:  Her family history includes Aneurysm in her father; Cancer in her mother, sister, and sister. There is no history of Anesthesia problems, Hypotension, Malignant hyperthermia, Pseudochol deficiency, or Colon cancer.   Allergies  Allergies  Allergen Reactions   Feraheme [Ferumoxytol] Shortness Of Breath   Cardizem Cd [Diltiazem Hcl Er Beads] Diarrhea   Amiodarone Nausea And Vomiting   Metoprolol Tartrate Diarrhea   Sulfa Antibiotics Nausea And Vomiting   Tomato Other (See Comments)    Burns stomach   Carvedilol Diarrhea and Rash   Chocolate Rash and Other (See Comments)    Dark chocolate     Home Medications  Prior to Admission medications   Medication Sig Start Date End Date Taking? Authorizing Provider  acetaminophen (TYLENOL) 650 MG CR tablet Take 650 mg by mouth every 8 (eight) hours as needed for pain.   Yes [provider]  albuterol (VENTOLIN HFA) 108 (90 Base) MCG/ACT inhaler Inhale 1-2 puffs into the lungs every 6 (six) hours as needed for wheezing or shortness of breath. 02/26/20  Yes Larey Dresser, MD  ALPRAZolam Duanne Moron) 0.5 MG tablet Take 0.5 mg by mouth at bedtime. 06/18/14  Yes [provider]  apixaban (ELIQUIS) 2.5 MG TABS tablet Take 1 tablet (2.5 mg total) by mouth 2 (two) times daily. 06/20/21  Yes Larey Dresser, MD  bisoprolol (ZEBETA) 5 MG tablet  TAKE 0.5 TABLETS (2.'5MG'$  TOTAL) BY MOUTH DAILY Patient taking differently: Take 2.5 mg by mouth daily. 11/14/21  Yes Larey Dresser, MD  budesonide-formoterol [facility]) 80-4.5 MCG/ACT inhaler Inhale 2 puffs into the lungs daily as needed (Asthma).   Yes [provider]  calcitRIOL (ROCALTROL) 0.25 MCG capsule Take 0.25 mcg by mouth every other day.   Yes [provider]  fluticasone (FLONASE) 50 MCG/ACT nasal spray Place 2 sprays into both nostrils as needed for allergies or rhinitis.   Yes [provider]  potassium chloride SA (KLOR-CON) 20 MEQ tablet Take 2 tablets (40 mEq total) by mouth daily. 11/01/20  Yes Milford, Jessica M, FNP  Tafamidis (VYNDAMAX) 61 MG CAPS Take 1 capsule by mouth daily. 06/08/21  Yes Larey Dresser, MD  torsemide (DEMADEX) 20 MG tablet TAKE THREE TABLETS BY MOUTH TWICE A DAY. Patient taking differently: Take 60 mg by mouth 2 (two) times daily. 09/14/21  Yes Larey Dresser, MD     Critical care time: 34 min  Erick Colace ACNP-BC [place] Pager # [phone] OR # [phone] if no answer

## 2021-12-08 NOTE — Progress Notes (Signed)
ANTICOAGULATION CONSULT NOTE - Initial Consult  Pharmacy Consult for heparin Indication: atrial fibrillation  Allergies  Allergen Reactions   Feraheme [Ferumoxytol] Shortness Of Breath   Cardizem Cd [Diltiazem Hcl Er Beads] Diarrhea   Amiodarone Nausea And Vomiting   Metoprolol Tartrate Diarrhea   Sulfa Antibiotics Nausea And Vomiting   Tomato Other (See Comments)    Burns stomach   Carvedilol Diarrhea and Rash   Chocolate Rash and Other (See Comments)    Dark chocolate    Patient Measurements: Height: '5\' 2"'$  (157.5 cm) Weight: 55.6 kg (122 lb 9.2 oz) IBW/kg (Calculated) : 50.1   Vital Signs: Temp: 99.6 F (37.6 C) (09/15 1921) Temp Source: Oral (09/15 1921) BP: 117/60 (09/15 1900) Pulse Rate: 85 (09/15 1921)  Labs: Recent Labs    12/07/2021 0446 12/06/2021 0455 12/02/2021 0635 12/19/2021 1311 11/29/2021 1547 12/06/2021 1634 12/07/2021 1844  HGB 11.3*  --   --   --   --  12.6  --   HCT 37.7  --   --   --   --  37.0  --   PLT 184  --   --   --   --   --   --   APTT  --   --   --   --   --   --  45*  HEPARINUNFRC  --   --   --   --   --   --  >1.10*  CREATININE  --  2.07*  --   --   --   --   --   TROPONINIHS 39*  --  129* 318* 325*  --   --      Estimated Creatinine Clearance: 16.3 mL/min (A) (by C-G formula based on SCr of 2.07 mg/dL (H)).   Medical History: Past Medical History:  Diagnosis Date   Anemia    Anxiety    Arthritis    Asthma    Atrial fibrillation ([place])    a. s/p unsuccessful DCCV in 01/2017 and intolerant to Amiodarone --> Rate-control strategy pursued since b. failed Tikosyn in 11/2017   Breast cancer ([place]) 2001   Right   CHF (congestive heart failure) ([facility])    Chronic kidney disease    Essential hypertension    GERD (gastroesophageal reflux disease)    T10 vertebral fracture ([facility])      Assessment: 81yof admitted s/p cardiac arrest now intubated and sedated  Chronic afib HR 70s on apixaban PTA - last dose 9/14 am  Cbc stable no bleeding  noted, will begin heaprin drip while apixaban on hold  Apixaban can falsely elevated heparin level will dose via aptt until aptt and heparin level correlate  PM f/u > heparin level tonight falsely elevated as expected.  Aptt below goal at 45.  No overt bleeding or complications noted.  Goal of Therapy:  aPTT 66-103 seconds Monitor platelets by anticoagulation protocol: Yes   Plan:  Increase heparin gtt to 550 units/hr. Aptt in 8 hrs. Daily cbc heparin level and aptt Monitor s/s bleeding   [place] Crane, Roylene Reason, [name] Clinical Pharmacist  11/27/2021 7:47 PM   [facility] pharmacy phone numbers are listed on [email or web address]

## 2021-12-08 NOTE — ED Notes (Signed)
[facility] here

## 2021-12-08 NOTE — ED Notes (Addendum)
Report received. Pt resting comfortably on vent. Husband at [facility]. Lidocaine and levofed infusing. VSS. Afib on monitor, HR 78-100. Remains on monitor and zoll. Breathing with vent. No u.o. in foley. NGT clamped.

## 2021-12-08 NOTE — Progress Notes (Signed)
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 Ventilator asynchrony.   eICU Interventions  Plan: Keppra 1 gm IV now, then 500 mg IV Q 12 hours.  Propofol IV infusion. Titrate to RASS = 0. Nurse to titrate Norepinephrine IV infusion via PIV as ordered if needed for BP support.      Intervention Category Major Interventions: Seizures - evaluation and management  Bailey Bishop 12/07/2021, 9:12 PM

## 2021-12-08 NOTE — ED Provider Notes (Signed)
[facility] EMERGENCY DEPARTMENT Provider Note   CSN: [id number] Arrival date & time: 12/14/2021  0444     History  Chief Complaint  Patient presents with   Cardiac Arrest    Bailey Bishop is a 81 y.o. female.  The history is provided by the EMS personnel and the spouse. The history is limited by the condition of the patient (Patient unresponsive).  Cardiac Arrest She has history of hypertension, chronic kidney disease, atrial fibrillation anticoagulated on apixaban, heart failure and is brought in by ambulance status post CPR.  She had been complaining of shortness of breath over the last 3-4 days and has been using her home nebulizer.  Her husband states that she was using the nebulizer when she collapsed and he immediately called for EMS.  Estimated time for EMS arrival was 5-10 minutes.  EMS noted she was in cardiac arrest and intubated her with a King airway and inserted an intraosseous line in her left shoulder and initiated CPR.  CPR was done for approximately 30 minutes.  She received a total of 5 mg of epinephrine before return of spontaneous circulation.  This was achieved approximately 20 minutes before arriving in the emergency department.  Of note, husband has informed me that patient had expressed a desire not to be living on a machine, but he feels that she would want short-term life support if there is a chance for survival.   Home Medications Prior to Admission medications   Medication Sig Start Date End Date Taking? Authorizing Provider  acetaminophen (TYLENOL) 650 MG CR tablet Take 650 mg by mouth every 8 (eight) hours as needed for pain.    [provider]  albuterol (VENTOLIN HFA) 108 (90 Base) MCG/ACT inhaler Inhale 1-2 puffs into the lungs every 6 (six) hours as needed for wheezing or shortness of breath. 02/26/20   Larey Dresser, MD  ALPRAZolam Duanne Moron) 0.5 MG tablet Take 0.5 mg by mouth at bedtime. 06/18/14   [provider]  apixaban (ELIQUIS)  2.5 MG TABS tablet Take 1 tablet (2.5 mg total) by mouth 2 (two) times daily. 06/20/21   Larey Dresser, MD  bisoprolol (ZEBETA) 5 MG tablet TAKE 0.5 TABLETS (2.'5MG'$  TOTAL) BY MOUTH DAILY 11/14/21   Larey Dresser, MD  budesonide (PULMICORT) 0.5 MG/2ML nebulizer solution Take 2 mLs (0.5 mg total) by nebulization 2 (two) times daily. Patient not taking: Reported on 09/18/2021 02/27/21   Valentina Shaggy, MD  budesonide-formoterol [facility]) 80-4.5 MCG/ACT inhaler Inhale 2 puffs into the lungs daily as needed (Asthma).    [provider]  calcitRIOL (ROCALTROL) 0.25 MCG capsule Take 0.25 mcg by mouth every Monday, Wednesday, and Friday.    [provider]  fluticasone (FLONASE) 50 MCG/ACT nasal spray Place 2 sprays into both nostrils as needed for allergies or rhinitis.    [provider]  ipratropium (ATROVENT) 0.03 % nasal spray Place 2 sprays into both nostrils in the morning, at noon, in the evening, and at bedtime. Patient not taking: Reported on 09/18/2021 02/27/21   Valentina Shaggy, MD  potassium chloride SA (KLOR-CON) 20 MEQ tablet Take 2 tablets (40 mEq total) by mouth daily. 11/01/20   [place], Maricela Bo, FNP  Tafamidis (VYNDAMAX) 61 MG CAPS Take 1 capsule by mouth daily. 06/08/21   Larey Dresser, MD  torsemide (DEMADEX) 20 MG tablet TAKE THREE TABLETS BY MOUTH TWICE A DAY. 09/14/21   Larey Dresser, MD      Allergies  Feraheme [ferumoxytol], Cardizem cd [diltiazem hcl er beads], Amiodarone, Metoprolol tartrate, Sulfa antibiotics, Tomato, Carvedilol, and Chocolate    Review of Systems   Review of Systems  Unable to perform ROS: Patient unresponsive    Physical Exam Updated Vital Signs BP 127/73   Pulse 88   Temp (!) 95.4 F (35.2 C)   Resp 19   Ht '5\' 2"'$  (1.575 m)   Wt 55.6 kg   SpO2 100%   BMI 22.42 kg/m  Physical Exam Vitals and nursing note reviewed.   81 year old female, unresponsive with King airway in place. Vital signs are  normal. Oxygen saturation is 100%, which is normal. Head is normocephalic and atraumatic.  Pupils are 7 mm and unresponsive.  Corneal reflex is absent. Oropharynx is clear. Neck shows no JVD. Lungs have distant breath sounds with diffuse expiratory wheezes. Chest moves symmetrically. Heart tones are distant and irregular. Abdomen is slightly distended. Extremities: Intraosseous line present in the left shoulder, no edema present. Skin is warm and dry without rash. Neurologic: Unresponsive to painful stimuli, no spontaneous movement or respirations.  GCS=3.  ED Results / Procedures / Treatments   Labs (all labs ordered are listed, but only abnormal results are displayed) Labs Reviewed  COMPREHENSIVE METABOLIC PANEL - Abnormal; Notable for the following components:      Result Value   CO2 18 (*)    Glucose, Bld 262 (*)    BUN 31 (*)    Creatinine, Ser 2.07 (*)    Calcium 7.9 (*)    Total Protein 5.8 (*)    Albumin 3.0 (*)    GFR, Estimated 23 (*)    All other components within normal limits  CBC WITH DIFFERENTIAL/PLATELET - Abnormal; Notable for the following components:   WBC 15.3 (*)    RBC 3.51 (*)    Hemoglobin 11.3 (*)    MCV 107.4 (*)    nRBC 0.3 (*)    Lymphs Abs 7.8 (*)    Eosinophils Absolute 1.2 (*)    Abs Immature Granulocytes 0.60 (*)    All other components within normal limits  BLOOD GAS, ARTERIAL - Abnormal; Notable for the following components:   pH, Arterial 7.02 (*)    pO2, Arterial 441 (*)    Bicarbonate 12.5 (*)    Acid-base deficit 19.0 (*)    All other components within normal limits  TROPONIN I (HIGH SENSITIVITY) - Abnormal; Notable for the following components:   Troponin I (High Sensitivity) 39 (*)    All other components within normal limits  TROPONIN I (HIGH SENSITIVITY) - Abnormal; Notable for the following components:   Troponin I (High Sensitivity) 129 (*)    All other components within normal limits    EKG EKG  Interpretation  Date/Time:  Friday December 08 2021 04:45:30 EDT Ventricular Rate:  112 PR Interval:    QRS Duration: 96 QT Interval:  359 QTC Calculation: 435 R Axis:   -80 Text Interpretation: Atrial fibrillation Ventricular tachycardia, unsustained Inferior infarct, old LOW VOLTAGE When compared with ECG of 09/18/2021, Low voltage QRS No significant change was found present Premature ventricular complexes are now present Confirmed by Delora Fuel ([id number]) on 11/25/2021 4:58:35 AM  Radiology CT Head Wo Contrast  Result Date: 12/22/2021 CLINICAL DATA:  Mental status changes. Status post cardiac arrest. Ventilator dependence. EXAM: CT HEAD WITHOUT CONTRAST TECHNIQUE: Contiguous axial images were obtained from the base of the skull through the vertex without intravenous contrast. RADIATION DOSE REDUCTION: This exam was performed  according to the departmental dose-optimization program which includes automated exposure control, adjustment of the mA and/or kV according to patient size and/or use of iterative reconstruction technique. COMPARISON:  None Available. FINDINGS: Brain: There is no evidence for acute hemorrhage, hydrocephalus, mass lesion, or abnormal extra-axial fluid collection. No definite CT evidence for acute infarction. Patchy low attenuation in the deep hemispheric and periventricular white matter is nonspecific, but likely reflects chronic microvascular ischemic demyelination. Vascular: No hyperdense vessel or unexpected calcification. Skull: No evidence for fracture. No worrisome lytic or sclerotic lesion. Sinuses/Orbits: Chronic mucosal disease noted left maxillary sinus. Visualized portions of the globes and intraorbital fat are unremarkable. Other: None. IMPRESSION: 1. No acute intracranial abnormality. 2. Chronic microvascular white matter ischemic disease. Electronically Signed   By: Misty Stanley M.D.   On: 12/07/2021 06:25   DG Chest Port 1 View  Result Date: 12/01/2021 CLINICAL  DATA:  81 year old female status post CPR, intubated. EXAM: PORTABLE CHEST 1 VIEW COMPARISON:  Portable chest 11/06/2019. FINDINGS: Portable AP supine view at 0502 hours. Satisfactory endotracheal tube tip about 9 mm above the carina. Enteric tube courses to the abdomen, tip not included. Multiple external leads and wires overlie the chest. Larger lung volumes with regression of cardiomegaly seen in 2021. Calcified aortic atherosclerosis. Other mediastinal contours are within normal limits. Allowing for portable technique the lungs are clear. No pneumothorax or pleural effusion evident on this supine view. Osteopenia. Chronic right axillary surgical clips. No acute osseous abnormality identified. Paucity of bowel gas in the visible upper abdomen. IMPRESSION: 1. Satisfactory endotracheal tube placement. Enteric tube courses to the abdomen, tip not included. 2. Larger lung volumes with no acute cardiopulmonary abnormality identified. Electronically Signed   By: Genevie Ann M.D.   On: 12/17/2021 05:22    Procedures Date/Time: 11/28/2021 5:38 AM  Performed by: Delora Fuel, MDPre-anesthesia Checklist: Patient identified, Patient being monitored, Emergency Drugs available, Timeout performed and Suction available Oxygen Delivery Method: Ambu bag Preoxygenation: Pre-oxygenation with 100% oxygen Ventilation: Mask ventilation with difficulty Laryngoscope Size: Glidescope and 3 Grade View: Grade I Tube size: 7.5 mm Number of attempts: 1 Airway Equipment and Method: Rigid stylet and Video-laryngoscopy Placement Confirmation: ETT inserted through vocal cords under direct vision, CO2 detector and Breath sounds checked- equal and bilateral Secured at: 24 cm Tube secured with: ETT holder Dental Injury: Teeth and Oropharynx as per pre-operative assessment       Cardiac monitor shows atrial fibrillation with PVCs and runs of nonsustained ventricular tachycardia, per my interpretation.  Medications Ordered in  ED Medications  amiodarone (CORDARONE) 150 MG/3ML injection (  Not Given 12/13/2021 0459)  lidocaine (XYLOCAINE) 4 mg/mL bolus via infusion 75 mg (75 mg Intravenous Bolus from Bag 12/16/2021 0514)    And  lidocaine (cardiac) 2000 mg in dextrose 5% 500 mL ('4mg'$ /mL) IV infusion (2 mg/min Intravenous New Bag/Given 12/22/2021 0514)  albuterol (PROVENTIL) (2.5 MG/3ML) 0.083% nebulizer solution (has no administration in time range)  EPINEPHrine (ADRENALIN) 1 MG/10ML injection ( Intravenous Canceled Entry 12/17/2021 0515)  norepinephrine (LEVOPHED) '4mg'$  in 289m (0.016 mg/mL) premix infusion (10 mcg/min Intravenous New Bag/Given 12/17/2021 0508)  sodium bicarbonate injection 50 mEq (has no administration in time range)  methylPREDNISolone sodium succinate (SOLU-MEDROL) 125 mg/2 mL injection 125 mg (125 mg Intravenous Given 12/19/2021 0503)  lactated ringers bolus 1,000 mL (1,000 mLs Intravenous New Bag/Given 12/05/2021 0458)    ED Course/ Medical Decision Making/ A&P  Medical Decision Making Amount and/or Complexity of Data Reviewed Labs: ordered. Radiology: ordered.  Risk Prescription drug management. Decision regarding hospitalization.   Cardiopulmonary arrest with successful return of spontaneous circulation.  King airway was removed and endotracheal tube placed, continues to have distant sounds with expiratory wheezing.  Albuterol via nebulization is initiated.  She continued to have runs of PVCs, she has a intolerance to amiodarone, I have ordered intravenous lidocaine for rhythm control.  With wheezing and shortness of breath and no signs of heart failure, I suspect this was a primary respiratory event.  Patient started to become bradycardic and hypotensive.  I have ordered intravenous epinephrine and initiation of norepinephrine drip.  Following this, heart rate has increased and blood pressure is now adequate.  I have reviewed her ECG, and my interpretation is atrial fibrillation with  runs of nonsustained ventricular tachycardia, low voltage with low voltage and nonsustained ventricular tachycardia new compared with prior.  I have reviewed and interpreted her laboratory tests, my interpretation is macrocytic anemia which is new, leukocytosis which is nonspecific, stable renal insufficiency, elevated random glucose likely stress related, primarily metabolic acidosis.  Mildly elevated troponin is present which I feel is secondary to combination of demand ischemia and CPR with low index of suspicion for ACS.  I have ordered a dose of intravenous sodium bicarbonate.  Since she is anticoagulated, I have ordered CT of the head.  Chest x-ray shows adequate position of endotracheal tube and orogastric tube and decreased heart size compared with prior, no acute pulmonary process.  I have independently viewed the images, and agree with radiologist's interpretation.  CT scan shows no acute process.  I have independently viewed the images, and agree with radiologist interpretation.  Blood pressure and heart rhythm have remained stable, and patient is starting to have some spontaneous movement but no purposeful movement.  Case discussed with Dr. Patsey Berthold of critical care service who agrees to accept the patient in transfer to [facility] ICU.  CRITICAL CARE Performed by: Delora Fuel Total critical care time: 160 minutes Critical care time was exclusive of separately billable procedures and treating other patients. Critical care was necessary to treat or prevent imminent or life-threatening deterioration. Critical care was time spent personally by me on the following activities: development of treatment plan with patient and/or surrogate as well as nursing, discussions with consultants, evaluation of patient's response to treatment, examination of patient, obtaining history from patient or surrogate, ordering and performing treatments and interventions, ordering and review of laboratory  studies, ordering and review of radiographic studies, pulse oximetry and re-evaluation of patient's condition.  Final Clinical Impression(s) / ED Diagnoses Final diagnoses:  Cardiac arrest ([place])  Metabolic acidosis  Metabolic encephalopathy  Renal insufficiency  Elevated troponin  Normochromic normocytic anemia  Elevated random blood glucose level  Chronic anticoagulation    Rx / DC Orders ED Discharge Orders     None         Delora Fuel, MD 64/33/29 [phone]

## 2021-12-08 NOTE — ED Notes (Addendum)
No changes. Husband has left BS. Pending transport arrival. SIL at [facility].

## 2021-12-08 NOTE — Progress Notes (Signed)
Patient transported to and from CT on vent

## 2021-12-08 NOTE — ED Triage Notes (Signed)
Spouse called to 911 due to pt having sob, upon fire department arrival to residence pt collapsed, cpr started, pt was given a total of 5 1 mg Epi by ems, 1000 cc NS fluid bolus, 1 dose of 0.4 mg narcan, cbg 189 with return of pulses at 04:22, estimated time of CPR was 30  minutes per EMS. Pt arrived to er with Edison Pace airway in place, HR 119, pulse ox 100% on 757 % oxygen, Dr Roxanne Mins at bedside,

## 2021-12-08 NOTE — Progress Notes (Signed)
ANTICOAGULATION CONSULT NOTE - Initial Consult  Pharmacy Consult for heparin Indication: atrial fibrillation  Allergies  Allergen Reactions   Feraheme [Ferumoxytol] Shortness Of Breath   Cardizem Cd [Diltiazem Hcl Er Beads] Diarrhea   Amiodarone Nausea And Vomiting   Metoprolol Tartrate Diarrhea   Sulfa Antibiotics Nausea And Vomiting   Tomato Other (See Comments)    Burns stomach   Carvedilol Diarrhea and Rash   Chocolate Rash and Other (See Comments)    Dark chocolate    Patient Measurements: Height: '5\' 2"'$  (157.5 cm) Weight: 55.6 kg (122 lb 9.2 oz) IBW/kg (Calculated) : 50.1   Vital Signs: Temp: 95.7 F (35.4 C) (09/15 0955) BP: 105/64 (09/15 1300) Pulse Rate: 72 (09/15 1300)  Labs: Recent Labs    12/18/2021 0446 11/26/2021 0455 12/20/2021 0635  HGB 11.3*  --   --   HCT 37.7  --   --   PLT 184  --   --   CREATININE  --  2.07*  --   TROPONINIHS 39*  --  129*    Estimated Creatinine Clearance: 16.3 mL/min (A) (by C-G formula based on SCr of 2.07 mg/dL (H)).   Medical History: Past Medical History:  Diagnosis Date   Anemia    Anxiety    Arthritis    Asthma    Atrial fibrillation ([place])    a. s/p unsuccessful DCCV in 01/2017 and intolerant to Amiodarone --> Rate-control strategy pursued since b. failed Tikosyn in 11/2017   Breast cancer ([place]) 2001   Right   CHF (congestive heart failure) ([place])    Chronic kidney disease    Essential hypertension    GERD (gastroesophageal reflux disease)    T10 vertebral fracture ([place])      Assessment: 81yof admitted s/p cardiac arrest now intubated and sedated  Chronic afib HR 70s on apixaban PTA - last dose 9/14 am  Cbc stable no bleeding noted, will begin heaprin drip while apixaban on hold  Apixaban can falsely elevated heparin level will dose via aptt until aptt and heparin level correlate  Goal of Therapy:  aPTT 66-103 seconds Monitor platelets by anticoagulation protocol: Yes   Plan:  Holding apixaban No  bolus Start heparin drip 400 uts/hr  Heparin level and aptt 6hr after start Daily cbc heparin level and aptt Monitor s/s bleeding     Bonnita Nasuti Pharm.D. CPP, BCPS Clinical Pharmacist [phone] 12/06/2021 1:10 PM

## 2021-12-08 NOTE — ED Notes (Signed)
Versed vial '5mg'$ /76m ordered PRN on 9/15 was never used, and returned to [facility]

## 2021-12-09 ENCOUNTER — Inpatient Hospital Stay (HOSPITAL_COMMUNITY): Payer: Medicare Other

## 2021-12-09 DIAGNOSIS — I469 Cardiac arrest, cause unspecified: Secondary | ICD-10-CM | POA: Diagnosis not present

## 2021-12-09 LAB — HEPARIN LEVEL (UNFRACTIONATED): Heparin Unfractionated: >1.1 IU/mL — ABNORMAL HIGH (ref 0.30–0.70)

## 2021-12-09 LAB — POCT I-STAT 7, (LYTES, BLD GAS, ICA,H+H)
Acid-Base Excess: 3 mmol/L — ABNORMAL HIGH (ref 0.0–2.0)
Acid-Base Excess: 4 mmol/L — ABNORMAL HIGH (ref 0.0–2.0)
Bicarbonate: 24.3 mmol/L (ref 20.0–28.0)
Bicarbonate: 26.3 mmol/L (ref 20.0–28.0)
Calcium, Ion: 1.04 mmol/L — ABNORMAL LOW (ref 1.15–1.40)
Calcium, Ion: 1.07 mmol/L — ABNORMAL LOW (ref 1.15–1.40)
HCT: 35 % — ABNORMAL LOW (ref 36.0–46.0)
HCT: 35 % — ABNORMAL LOW (ref 36.0–46.0)
Hemoglobin: 11.9 g/dL — ABNORMAL LOW (ref 12.0–15.0)
Hemoglobin: 11.9 g/dL — ABNORMAL LOW (ref 12.0–15.0)
O2 Saturation: 99 %
O2 Saturation: 99 %
Patient temperature: 37.5
Patient temperature: 98.8
Potassium: 3.2 mmol/L — ABNORMAL LOW (ref 3.5–5.1)
Potassium: 3.3 mmol/L — ABNORMAL LOW (ref 3.5–5.1)
Sodium: 138 mmol/L (ref 135–145)
Sodium: 138 mmol/L (ref 135–145)
TCO2: 25 mmol/L (ref 22–32)
TCO2: 27 mmol/L (ref 22–32)
pCO2 arterial: 28.6 mmHg — ABNORMAL LOW (ref 32–48)
pCO2 arterial: 32 mmHg (ref 32–48)
pH, Arterial: 7.538 — ABNORMAL HIGH (ref 7.35–7.45)
pH, Arterial: 7.524 — ABNORMAL HIGH (ref 7.35–7.45)
pO2, Arterial: 135 mmHg — ABNORMAL HIGH (ref 83–108)
pO2, Arterial: 124 mmHg — ABNORMAL HIGH (ref 83–108)

## 2021-12-09 LAB — APTT: aPTT: 69 seconds — ABNORMAL HIGH (ref 24–36)

## 2021-12-09 LAB — CBC
HCT: 35.9 % — ABNORMAL LOW (ref 36.0–46.0)
Hemoglobin: 12.4 g/dL (ref 12.0–15.0)
MCH: 32.2 pg (ref 26.0–34.0)
MCHC: 34.5 g/dL (ref 30.0–36.0)
MCV: 93.2 fL (ref 80.0–100.0)
Platelets: 185 10*3/uL (ref 150–400)
RBC: 3.85 MIL/uL — ABNORMAL LOW (ref 3.87–5.11)
RDW: 12.8 % (ref 11.5–15.5)
WBC: 25.5 10*3/uL — ABNORMAL HIGH (ref 4.0–10.5)
nRBC: 0 % (ref 0.0–0.2)

## 2021-12-09 LAB — MAGNESIUM: Magnesium: 1.9 mg/dL (ref 1.7–2.4)

## 2021-12-09 LAB — BASIC METABOLIC PANEL
Anion gap: 19 — ABNORMAL HIGH (ref 5–15)
BUN: 49 mg/dL — ABNORMAL HIGH (ref 8–23)
CO2: 20 mmol/L — ABNORMAL LOW (ref 22–32)
Calcium: 8.3 mg/dL — ABNORMAL LOW (ref 8.9–10.3)
Chloride: 101 mmol/L (ref 98–111)
Creatinine, Ser: 3.16 mg/dL — ABNORMAL HIGH (ref 0.44–1.00)
GFR, Estimated: 14 mL/min — ABNORMAL LOW (ref >60)
Glucose, Bld: 152 mg/dL — ABNORMAL HIGH (ref 70–99)
Potassium: 3.4 mmol/L — ABNORMAL LOW (ref 3.5–5.1)
Sodium: 140 mmol/L (ref 135–145)

## 2021-12-09 LAB — GLUCOSE, CAPILLARY
Glucose-Capillary: 148 mg/dL — ABNORMAL HIGH (ref 70–99)
Glucose-Capillary: 121 mg/dL — ABNORMAL HIGH (ref 70–99)

## 2021-12-09 LAB — TRIGLYCERIDES: Triglycerides: 52 mg/dL (ref <150)

## 2021-12-09 LAB — PHOSPHORUS: Phosphorus: 2.3 mg/dL — ABNORMAL LOW (ref 2.5–4.6)

## 2021-12-09 MED ORDER — SODIUM CHLORIDE 0.9 % IV SOLN: INTRAVENOUS | Status: DC

## 2021-12-09 MED ORDER — POLYVINYL ALCOHOL 1.4 % OP SOLN: 1.0000 [drp] | Freq: Four times a day (QID) | OPHTHALMIC | Status: DC | PRN

## 2021-12-09 MED ORDER — MORPHINE 100MG IN NS 100ML (1MG/ML) PREMIX INFUSION
0.0000 mg/h | INTRAVENOUS | Status: DC
  Administered 2021-12-09: 5 mg/h via INTRAVENOUS
  Filled 2021-12-09: qty 100

## 2021-12-09 MED ORDER — MORPHINE BOLUS VIA INFUSION
5.0000 mg | INTRAVENOUS | Status: DC | PRN
  Administered 2021-12-09 (×5): 5 mg via INTRAVENOUS

## 2021-12-09 MED ORDER — GLYCOPYRROLATE 0.2 MG/ML IJ SOLN
0.2000 mg | INTRAMUSCULAR | Status: DC | PRN
  Administered 2021-12-09: 0.2 mg via INTRAVENOUS
  Filled 2021-12-09: qty 1

## 2021-12-09 MED ORDER — LORAZEPAM 2 MG/ML IJ SOLN: 2.0000 mg | INTRAMUSCULAR | Status: DC | PRN

## 2021-12-09 MED ORDER — ACETAMINOPHEN 650 MG RE SUPP: 650.0000 mg | Freq: Four times a day (QID) | RECTAL | Status: DC | PRN

## 2021-12-09 MED ORDER — ACETAMINOPHEN 325 MG PO TABS: 650.0000 mg | ORAL_TABLET | Freq: Four times a day (QID) | ORAL | Status: DC | PRN

## 2021-12-09 MED ORDER — ONDANSETRON 4 MG PO TBDP: 4.0000 mg | ORAL_TABLET | Freq: Four times a day (QID) | ORAL | Status: DC | PRN

## 2021-12-09 MED ORDER — ONDANSETRON HCL 4 MG/2ML IJ SOLN: 4.0000 mg | Freq: Four times a day (QID) | INTRAMUSCULAR | Status: DC | PRN

## 2021-12-09 MED ORDER — GLYCOPYRROLATE 1 MG PO TABS: 1.0000 mg | ORAL_TABLET | ORAL | Status: DC | PRN

## 2021-12-09 MED ORDER — ARTIFICIAL TEARS OPHTHALMIC OINT
TOPICAL_OINTMENT | OPHTHALMIC | Status: DC | PRN
  Administered 2021-12-09: 1 via OPHTHALMIC
  Filled 2021-12-09: qty 3.5

## 2021-12-09 MED ORDER — GLYCOPYRROLATE 0.2 MG/ML IJ SOLN: 0.2000 mg | INTRAMUSCULAR | Status: DC | PRN

## 2021-12-10 DIAGNOSIS — G931 Anoxic brain damage, not elsewhere classified: Secondary | ICD-10-CM | POA: Diagnosis present

## 2021-12-10 DIAGNOSIS — R579 Shock, unspecified: Secondary | ICD-10-CM | POA: Diagnosis present

## 2021-12-10 DIAGNOSIS — N19 Unspecified kidney failure: Secondary | ICD-10-CM | POA: Diagnosis present

## 2021-12-10 DIAGNOSIS — Z515 Encounter for palliative care: Secondary | ICD-10-CM

## 2021-12-10 DIAGNOSIS — Z66 Do not resuscitate: Secondary | ICD-10-CM | POA: Diagnosis present

## 2021-12-11 LAB — CULTURE, RESPIRATORY W GRAM STAIN: Culture: NORMAL

## 2021-12-21 ENCOUNTER — Other Ambulatory Visit (HOSPITAL_COMMUNITY): Payer: Self-pay

## 2021-12-21 ENCOUNTER — Encounter (HOSPITAL_COMMUNITY): Payer: Medicare Other | Admitting: Cardiology

## 2021-12-24 NOTE — Progress Notes (Signed)
NAME:  Bailey Bishop, MRN:  [id number], DOB:  24-Nov-1940, LOS: 1 ADMISSION DATE:  12/20/2021, CONSULTATION DATE:  9/15 REFERRING MD:  glick, CHIEF COMPLAINT:  cardiac arrest   History of Present Illness:  This is an 81 year old female patient with history of CKD, atrial fibrillation on DOAC And congestive heart failure.  Per review of medical records had been complaining of worsening shortness of breath for approximately 3 to 4 days using home dilator without improvement.  The patient was at home, once again using her nebulizer device the morning of 9/15 when she suddenly collapsed, as witnessed by her husband.  He immediately called EMS, arrival was estimated at 5 to 10 minutes.  On arrival she was found to be pulseless.  A King airway was placed, ACLS was initiated, and estimated 30 minutes of CPR was administered prior to return of spontaneous circulation.Marland Kitchen  She was brought to [facility], and subsequently transferred to [facility] on arrival  Significant Hospital Events: Including procedures, antibiotic start and stop dates in addition to other pertinent events   Arrived at [facility] on 9/15 initial exam: Would open eyes and decerebrate posture with noxious stimulus only.  On arrival she was on 5 mcg/min norepinephrine.  Spoke with husband at bedside confirmed DO NOT RESUSCITATE status, with plan to support for 48 hours to see if there was clinical improvement  Interim History / Subjective:  Comatose, off pressors.  No neurologic recovery overnight.  Propofol started for some concern for myoclonus on exam as well as hypertension/tachycardia.  Her neurologic exam is devastated, but improved.  Met with husband and spoke for a long period of time.  He sees no improvement.  States she would not want a live on the ventilator.  To have a we will assess the same thing.  She does not live on life support.  In addition she has a religious objection to this via her status is Jehovah's  Witness per his report.  States she would not want to be like this.  Offered additional time to see if neurologic recovery acknowledging her exam is very poor.  He states that withdrawing care would be more audible to her memory and her wishes.  It would also comfort him.  Notably, today is the 65th wedding anniversary.  Objective   Blood pressure 123/80, pulse (!) 123, temperature 97.9 F (36.6 C), resp. rate (!) 25, height _0  (1.575 m), weight 57 kg, SpO2 97 %.    Vent Mode: PRVC FiO2 (%):  [40 %-60 %] 40 % Set Rate:  [14 bmp-18 bmp] 14 bmp Vt Set:  [400 mL] 400 mL PEEP:  [5 cmH20] 5 cmH20 Plateau Pressure:  [17 cmH20-23 cmH20] 18 cmH20   Intake/Output Summary (Last 24 hours) at 28-Dec-2021 0934 Last data filed at 28-Dec-2021 0800 Gross per 24 hour  Intake 2650.25 ml  Output 510 ml  Net 2140.25 ml    Filed Weights   12/13/2021 0522 2021-12-28 0446  Weight: 55.6 kg 57 kg    Examination: General: Frail-appearing 81 year old female currently on full ventilator support HENT: Normocephalic atraumatic pupils irregular but reactive left greater than right Lungs: Scattered rhonchi. Portable chest x-ray personally reviewed Endotracheal tube in satisfactory position orogastric tube below diaphragm, no clear pulmonary edema Cardiovascular: Regular irregular with atrial fibrillation on telemetry Abdomen: Soft Extremities: Cool no significant edema pulses palpable Neuro: Comatose, does not follow commands, does not arouse to voice or sternal rub, extensor posturing with noxious stimuli  in the upper extremities, no response to lower extremity noxious stimuli, pupils intact, stimulating vent, no cough or gag, exam done on no sedation GU: Clear yellow urine  Resolved Hospital Problem list     Assessment & Plan:  Cardiac arrest, VT H/o HFpEF Plan  Withdrawing care per patient and husband wishes, comfort care  Acute hypoxic respiratory failure following cardiac arrest, superimposed on  probable underlying COPD Plan Extubate for comfort  acute metabolic encephalopathy/probable anoxic brain injury Initial CT of the head negative, there was chronic microvascular changes however on presentation she is exhibiting decerebrate posturing. On exam AM 9/16 extensor posturing to pain in UE, no response lower ext, pupils intact, triggering vent, no cough or gag.  Plan Comfort care per patient and family wishes Morphine drip, morphine PRN, ativan PRN  History of chronic atrial fibrillation on DOAC Plan Stopping meds for comfort    Best Practice (right click and "Reselect all SmartList Selections" daily)   Diet/type: NPO DVT prophylaxis: other stopping for comfort GI prophylaxis: N/A Lines: N/A Foley:  Yes, and it is still needed Code Status:  DNR Last date of multidisciplinary goals of care discussion [Comfort care after discussion with husband of 59 years, she would not want to be on life support for personal healthcare values as well as religious reasons, comfort care orders placed ]  Labs   CBC: Recent Labs  Lab 12/20/2021 0446 12/03/2021 1634 2021-12-27 0439 12-27-21 0536 12-27-21 0752  WBC 15.3*  --   --  25.5*  --   NEUTROABS 5.4  --   --   --   --   HGB 11.3* 12.6 11.9* 12.4 11.9*  HCT 37.7 37.0 35.0* 35.9* 35.0*  MCV 107.4*  --   --  93.2  --   PLT 184  --   --  185  --      Basic Metabolic Panel: Recent Labs  Lab 12/14/2021 0455 12/10/2021 1634 11/25/2021 1844 12/27/2021 0439 December 27, 2021 0536 December 27, 2021 0752  NA 142 140 140 138 140 138  K 4.6 3.9 4.1 3.2* 3.4* 3.3*  CL 111  --  107  --  101  --   CO2 18*  --  17*  --  20*  --   GLUCOSE 262*  --  248*  --  152*  --   BUN 31*  --  44*  --  49*  --   CREATININE 2.07*  --  2.93*  --  3.16*  --   CALCIUM 7.9*  --  7.9*  --  8.3*  --   MG  --   --   --   --  1.9  --   PHOS  --   --   --   --  2.3*  --     GFR: Estimated Creatinine Clearance: 10.7 mL/min (A) (by C-G formula based on SCr of 3.16 mg/dL  (H)). Recent Labs  Lab 12/16/2021 0446 11/27/2021 1311 12/17/2021 1547 Dec 27, 2021 0536  PROCALCITON  --  4.46  --   --   WBC 15.3*  --   --  25.5*  LATICACIDVEN  --  4.3* 4.1*  --      Liver Function Tests: Recent Labs  Lab 12/13/2021 0455 12/13/2021 1844  AST 33 52*  ALT 17 23  ALKPHOS 95 73  BILITOT 0.5 0.6  PROT 5.8* 5.7*  ALBUMIN 3.0* 2.9*    No results for input(s): "LIPASE", "AMYLASE" in the last 168 hours. No results for input(s): "AMMONIA"  in the last 168 hours.  ABG    Component Value Date/Time   PHART 7.524 (H) 12-30-21 0752   PCO2ART 32.0 12-30-21 0752   PO2ART 124 (H) 2021-12-30 0752   HCO3 26.3 Dec 30, 2021 0752   TCO2 27 12/30/21 0752   ACIDBASEDEF 5.0 (H) 12/23/2021 1634   O2SAT 99 2021/12/30 0752     Coagulation Profile: No results for input(s): "INR", "PROTIME" in the last 168 hours.  Cardiac Enzymes: No results for input(s): "CKTOTAL", "CKMB", "CKMBINDEX", "TROPONINI" in the last 168 hours.  HbA1C: Hgb A1c MFr Bld  Date/Time Value Ref Range Status  12/21/2021 01:11 PM 4.7 (L) 4.8 - 5.6 % Final    Comment:    (NOTE) Pre diabetes:          5.7%-6.4%  Diabetes:              >6.4%  Glycemic control for   <7.0% adults with diabetes   10/08/2019 01:54 PM 5.4 4.8 - 5.6 % Final    Comment:    (NOTE) Pre diabetes:          5.7%-6.4%  Diabetes:              >6.4%  Glycemic control for   <7.0% adults with diabetes     CBG: Recent Labs  Lab 12/05/2021 1509 11/27/2021 1942 12/07/2021 2348 12/30/21 0412 12/30/2021 0819  GLUCAP 263* 209* 166* 148* 121*     Review of Systems:   Not able  Past Medical History:  She,  has a past medical history of Anemia, Anxiety, Arthritis, Asthma, Atrial fibrillation ([place]), Breast cancer ([place]) (2001), CHF (congestive heart failure) ([place]), Chronic kidney disease, Essential hypertension, GERD (gastroesophageal reflux disease), and T10 vertebral fracture ([place]).   Surgical History:   Past Surgical History:   Procedure Laterality Date   BIOPSY N/A 04/22/2018   Procedure: BIOPSY;  Surgeon: Aviva Signs, MD;  [facility];  Service: Gastroenterology;  Laterality: N/A;   BIOPSY  09/06/2019   Procedure: BIOPSY;  Surgeon: Daneil Dolin, MD;  [facility];  Service: Endoscopy;;  gastric   CARDIOVERSION N/A 02/18/2017   Procedure: CARDIOVERSION;  Surgeon: Josue Hector, MD;  Location: [facility];  Service: Cardiovascular;  Laterality: N/A;   CARDIOVERSION N/A 12/04/2017   Procedure: CARDIOVERSION;  Surgeon: [name], MD;  Location: [place];  Service: Cardiovascular;  Laterality: N/A;   CATARACT EXTRACTION W/PHACO  07/23/2011   Procedure: CATARACT EXTRACTION PHACO AND INTRAOCULAR LENS PLACEMENT ([place]);  Surgeon: Williams Che, MD;  Location: [facility];  Service: Ophthalmology;  Laterality: Right;  CDE:9.96   CATARACT EXTRACTION W/PHACO Left 11/13/2018   Procedure: CATARACT EXTRACTION PHACO AND INTRAOCULAR LENS PLACEMENT (IOC);  Surgeon: Baruch Goldmann, MD;  Location: [facility];  Service: Ophthalmology;  Laterality: Left;  left, CDE: 7.44   CHOLECYSTECTOMY N/A 02/14/2015   Procedure: LAPAROSCOPIC CHOLECYSTECTOMY;  Surgeon: Aviva Signs, MD;  Location: [facility];  Service: General;  Laterality: N/A;   COLONOSCOPY N/A 04/22/2018   Procedure: COLONOSCOPY;  Surgeon: Aviva Signs, MD;  [facility];  Service: Gastroenterology;  Laterality: N/A;   COLONOSCOPY WITH PROPOFOL N/A 09/06/2019   Procedure: COLONOSCOPY WITH PROPOFOL;  Surgeon: Daneil Dolin, MD;  [facility];  Service: Endoscopy;  Laterality: N/A;   ESOPHAGEAL DILATION  09/06/2019   Procedure: ESOPHAGEAL DILATION;  Surgeon: Daneil Dolin, MD;  [facility];  Service: Endoscopy;;   ESOPHAGOGASTRODUODENOSCOPY N/A 04/22/2018   Procedure: ESOPHAGOGASTRODUODENOSCOPY (EGD);  Surgeon:  Aviva Signs, MD;  [facility];  Service: Gastroenterology;  Laterality: N/A;    ESOPHAGOGASTRODUODENOSCOPY (EGD) WITH PROPOFOL N/A 09/06/2019   Procedure: ESOPHAGOGASTRODUODENOSCOPY (EGD) WITH PROPOFOL;  Surgeon: Daneil Dolin, MD;  [facility];  Service: Endoscopy;  Laterality: N/A;   GIVENS CAPSULE STUDY N/A 10/31/2019   Procedure: GIVENS CAPSULE STUDY;  Surgeon: Harvel Quale, MD;  [facility];  Service: Gastroenterology;  Laterality: N/A;   MASTECTOMY     bilateral mastectomy-right breast cancer-left taken by choice   POLYPECTOMY  04/22/2018   Procedure: POLYPECTOMY;  Surgeon: Aviva Signs, MD;  [facility];  Service: Gastroenterology;;   POLYPECTOMY  09/06/2019   Procedure: POLYPECTOMY;  Surgeon: Daneil Dolin, MD;  [facility];  Service: Endoscopy;;  cecal, ascending, hepatic flexure, sigmoid   RIGHT HEART CATH N/A 11/09/2019   Procedure: RIGHT HEART CATH;  Surgeon: Jolaine Artist, MD;  Location: [place] [facility];  Service: Cardiovascular;  Laterality: N/A;   SEPTOPLASTY  1995   TEE WITHOUT CARDIOVERSION N/A 09/27/2021   Procedure: TRANSESOPHAGEAL ECHOCARDIOGRAM (TEE);  Surgeon: Larey Dresser, MD;  Location: [facility] ENDOSCOPY;  Service: Cardiovascular;  Laterality: N/A;     Social History:   reports that she quit smoking about 38 years ago. Her smoking use included cigarettes. She has a 28.00 pack-year smoking history. She has never used smokeless tobacco. She reports that she does not drink alcohol and does not use drugs.   Family History:  Her family history includes Aneurysm in her father; Cancer in her mother, sister, and sister. There is no history of Anesthesia problems, Hypotension, Malignant hyperthermia, Pseudochol deficiency, or Colon cancer.   Allergies Allergies  Allergen Reactions   Feraheme [Ferumoxytol] Shortness Of Breath   Cardizem Cd [Diltiazem Hcl Er Beads] Diarrhea   Amiodarone Nausea And Vomiting   Metoprolol Tartrate Diarrhea   Sulfa Antibiotics Nausea And Vomiting   Tomato  Other (See Comments)    Burns stomach   Carvedilol Diarrhea and Rash   Chocolate Rash and Other (See Comments)    Dark chocolate     Home Medications  Prior to Admission medications   Medication Sig Start Date End Date Taking? Authorizing Provider  acetaminophen (TYLENOL) 650 MG CR tablet Take 650 mg by mouth every 8 (eight) hours as needed for pain.   Yes [provider]  albuterol (VENTOLIN HFA) 108 (90 Base) MCG/ACT inhaler Inhale 1-2 puffs into the lungs every 6 (six) hours as needed for wheezing or shortness of breath. 02/26/20  Yes Larey Dresser, MD  ALPRAZolam Duanne Moron) 0.5 MG tablet Take 0.5 mg by mouth at bedtime. 06/18/14  Yes [provider]  apixaban (ELIQUIS) 2.5 MG TABS tablet Take 1 tablet (2.5 mg total) by mouth 2 (two) times daily. 06/20/21  Yes Larey Dresser, MD  bisoprolol (ZEBETA) 5 MG tablet TAKE 0.5 TABLETS (2.5MG TOTAL) BY MOUTH DAILY Patient taking differently: Take 2.5 mg by mouth daily. 11/14/21  Yes Larey Dresser, MD  budesonide-formoterol [facility]) 80-4.5 MCG/ACT inhaler Inhale 2 puffs into the lungs daily as needed (Asthma).   Yes [provider]  calcitRIOL (ROCALTROL) 0.25 MCG capsule Take 0.25 mcg by mouth every other day.   Yes [provider]  fluticasone (FLONASE) 50 MCG/ACT nasal spray Place 2 sprays into both nostrils as needed for allergies or rhinitis.   Yes [provider]  potassium chloride SA (KLOR-CON) 20 MEQ tablet Take 2 tablets (40 mEq total)  by mouth daily. 11/01/20  Yes Milford, Jessica M, FNP  Tafamidis (VYNDAMAX) 61 MG CAPS Take 1 capsule by mouth daily. 06/08/21  Yes Larey Dresser, MD  torsemide (DEMADEX) 20 MG tablet TAKE THREE TABLETS BY MOUTH TWICE A DAY. Patient taking differently: Take 60 mg by mouth 2 (two) times daily. 09/14/21  Yes Larey Dresser, MD     Critical care time: n/a  Lanier Clam, MD Naomi for contact info OR # [phone] if no  answer  I Spent 55 minutes in the care of patient Coordination of care, face-to-face, and family discussion.

## 2021-12-24 NOTE — Death Summary Note (Signed)
DEATH SUMMARY   Patient Details  Name: Bailey Bishop MRN: [id number] DOB: 1941-01-28  Admission/Discharge Information   Admit Date:  2021-12-28  Date of Death: Date of Death: 12-29-21  Time of Death: Time of Death: Jun 27, 1130  Length of Stay: 1  Referring Physician: [name], MD   Reason(s) for Hospitalization  Cardiac arrest  Diagnoses  Preliminary cause of death: anxoic brain damage Secondary Diagnoses (including complications and co-morbidities):  Principal Problem:   Cardiac arrest [facility]) Active Problems:   Hypokalemia   Anticoagulated   Persistent atrial fibrillation ([place])   CKD (chronic kidney disease), stage IIIb   Acute on chronic diastolic HF (heart failure) ([place])   Renal failure   Shock ([place])   Anoxic brain damage ([place])   DNR (do not resuscitate)   Comfort measures only status   Brief Hospital Course (including significant findings, care, treatment, and services provided and events leading to death)  Bailey Bishop is a 81 y.o. year old female who had cardiac arrest at home with at least 30 minutes of resuscitation.  She was intubated in the field.  Transferred to [facility] for further evaluation.  Briefly on pressors for shock.  These were weaned off.  She had neurologic devastation.  No cough or gag.  She was triggering the vent and had pupillary reflexes.  She had some extensor posturing with noxious stimuli in the uppers but none in the lowers.  This is off sedation.  Her husband of 37 years was at the bedside expressed that she would not want to be on ventilators.  He did not think that she would want to be intubated in the first place.  She has this written her will.  Both own personal healthcare values as well as religious values as a relates to her religious views.  In light of this decision was made to make her comfort care.  Care was withdrawn and she died in the presence of her loved ones.    Pertinent Labs and Studies  Significant Diagnostic  Studies DG Chest Port 1 View  Result Date: 12-29-21 CLINICAL DATA:  Cardia respiratory arrest. EXAM: PORTABLE CHEST 1 VIEW COMPARISON:  2021-12-28 FINDINGS: 0555 hours. Endotracheal tube tip is 3.6 cm above the base of the carina. The NG tube passes into the stomach although the distal tip position is not included on the film. Rightward patient rotation. The lungs are clear without focal pneumonia, edema, pneumothorax or pleural effusion. Interstitial markings are diffusely coarsened with chronic features. Cardiopericardial silhouette is at upper limits of normal for size. Bones are diffusely demineralized. Telemetry leads overlie the chest. IMPRESSION: 1. Endotracheal tube tip 3.6 cm above the base of the carina. 2. Chronic interstitial coarsening without acute cardiopulmonary findings. Electronically Signed   By: Misty Stanley M.D.   On: Dec 29, 2021 06:24   CT Head Wo Contrast  Result Date: 28-Dec-2021 CLINICAL DATA:  Mental status changes. Status post cardiac arrest. Ventilator dependence. EXAM: CT HEAD WITHOUT CONTRAST TECHNIQUE: Contiguous axial images were obtained from the base of the skull through the vertex without intravenous contrast. RADIATION DOSE REDUCTION: This exam was performed according to the departmental dose-optimization program which includes automated exposure control, adjustment of the mA and/or kV according to patient size and/or use of iterative reconstruction technique. COMPARISON:  None Available. FINDINGS: Brain: There is no evidence for acute hemorrhage, hydrocephalus, mass lesion, or abnormal extra-axial fluid collection. No definite CT evidence for acute infarction. Patchy low attenuation in the deep hemispheric and  periventricular white matter is nonspecific, but likely reflects chronic microvascular ischemic demyelination. Vascular: No hyperdense vessel or unexpected calcification. Skull: No evidence for fracture. No worrisome lytic or sclerotic lesion. Sinuses/Orbits:  Chronic mucosal disease noted left maxillary sinus. Visualized portions of the globes and intraorbital fat are unremarkable. Other: None. IMPRESSION: 1. No acute intracranial abnormality. 2. Chronic microvascular white matter ischemic disease. Electronically Signed   By: Misty Stanley M.D.   On: 12/04/2021 06:25   DG Chest Port 1 View  Result Date: 12/03/2021 CLINICAL DATA:  81 year old female status post CPR, intubated. EXAM: PORTABLE CHEST 1 VIEW COMPARISON:  Portable chest 11/06/2019. FINDINGS: Portable AP supine view at 0502 hours. Satisfactory endotracheal tube tip about 9 mm above the carina. Enteric tube courses to the abdomen, tip not included. Multiple external leads and wires overlie the chest. Larger lung volumes with regression of cardiomegaly seen in 2021. Calcified aortic atherosclerosis. Other mediastinal contours are within normal limits. Allowing for portable technique the lungs are clear. No pneumothorax or pleural effusion evident on this supine view. Osteopenia. Chronic right axillary surgical clips. No acute osseous abnormality identified. Paucity of bowel gas in the visible upper abdomen. IMPRESSION: 1. Satisfactory endotracheal tube placement. Enteric tube courses to the abdomen, tip not included. 2. Larger lung volumes with no acute cardiopulmonary abnormality identified. Electronically Signed   By: Genevie Ann M.D.   On: 12/21/2021 05:22    Microbiology Recent Results (from the past 240 hour(s))  MRSA Next Gen by PCR, Nasal     Status: None   Collection Time: 12/14/2021 11:56 AM   Specimen: Nasal Mucosa; Nasal Swab  Result Value Ref Range Status   MRSA by PCR Next Gen NOT DETECTED NOT DETECTED Final    Comment: (NOTE) The GeneXpert MRSA Assay (FDA approved for NASAL specimens only), is one component of a comprehensive MRSA colonization surveillance program. It is not intended to diagnose MRSA infection nor to guide or monitor treatment for MRSA infections. Test performance is not  FDA approved in patients less than 66 years old. Performed at [facility], [place] [address], [place], [place] [address]   Culture, Respiratory w Gram Stain (tracheal aspirate)     Status: None (Preliminary result)   Collection Time: 12/19/2021 12:20 PM   Specimen: Tracheal Aspirate; Respiratory  Result Value Ref Range Status   Specimen Description TRACHEAL ASPIRATE  Final   Special Requests NONE  Final   Gram Stain   Final    ABUNDANT WBC PRESENT, PREDOMINANTLY PMN RARE GRAM POSITIVE COCCI IN PAIRS    Culture   Final    CULTURE REINCUBATED FOR BETTER GROWTH Performed at [facility], [address], [place], [place] [address]    Report Status PENDING  Incomplete    Lab Basic Metabolic Panel: Recent Labs  Lab 11/24/2021 0455 11/29/2021 1634 11/26/2021 1844 12/28/2021 0439 12-28-21 0536 12/28/2021 0752  NA 142 140 140 138 140 138  K 4.6 3.9 4.1 3.2* 3.4* 3.3*  CL 111  --  107  --  101  --   CO2 18*  --  17*  --  20*  --   GLUCOSE 262*  --  248*  --  152*  --   BUN 31*  --  44*  --  49*  --   CREATININE 2.07*  --  2.93*  --  3.16*  --   CALCIUM 7.9*  --  7.9*  --  8.3*  --   MG  --   --   --   --  1.9  --   PHOS  --   --   --   --  2.3*  --    Liver Function Tests: Recent Labs  Lab 12/05/2021 0455 12/06/2021 1844  AST 33 52*  ALT 17 23  ALKPHOS 95 73  BILITOT 0.5 0.6  PROT 5.8* 5.7*  ALBUMIN 3.0* 2.9*   No results for input(s): "LIPASE", "AMYLASE" in the last 168 hours. No results for input(s): "AMMONIA" in the last 168 hours. CBC: Recent Labs  Lab 12/07/2021 0446 12/22/2021 1634 01/03/22 0439 01-03-22 0536 01-03-2022 0752  WBC 15.3*  --   --  25.5*  --   NEUTROABS 5.4  --   --   --   --   HGB 11.3* 12.6 11.9* 12.4 11.9*  HCT 37.7 37.0 35.0* 35.9* 35.0*  MCV 107.4*  --   --  93.2  --   PLT 184  --   --  185  --    Cardiac Enzymes: No results for input(s): "CKTOTAL", "CKMB", "CKMBINDEX", "TROPONINI" in the last 168 hours. Sepsis Labs: Recent Labs  Lab  11/24/2021 0446 11/28/2021 1311 11/26/2021 1547 2022-01-03 0536  PROCALCITON  --  4.46  --   --   WBC 15.3*  --   --  25.5*  LATICACIDVEN  --  4.3* 4.1*  --     Procedures/Operations  As per EMR   Bonna Gains Koty Anctil 12/10/2021, 5:08 PM

## 2021-12-24 NOTE — Procedures (Signed)
Extubation Procedure Note  Patient Details:   Name: Bailey Bishop DOB: 01-08-1941 MRN: [id number]   Airway Documentation:    Vent end date: 12/10/21 Vent end time: 1031   Evaluation  O2 sats: stable throughout Complications: No apparent complications Patient did tolerate procedure well. Bilateral Breath Sounds: Clear, Diminished   No, pt could not speak post extubation.  Pt extubated to room air per physician's order and in accordance with the family's wishes.  Earney Navy 2021-12-15, 10:31 AM

## 2021-12-24 NOTE — Progress Notes (Signed)
ANTICOAGULATION CONSULT NOTE - Initial Consult  Pharmacy Consult for heparin Indication: atrial fibrillation  Allergies  Allergen Reactions   Feraheme [Ferumoxytol] Shortness Of Breath   Cardizem Cd [Diltiazem Hcl Er Beads] Diarrhea   Amiodarone Nausea And Vomiting   Metoprolol Tartrate Diarrhea   Sulfa Antibiotics Nausea And Vomiting   Tomato Other (See Comments)    Burns stomach   Carvedilol Diarrhea and Rash   Chocolate Rash and Other (See Comments)    Dark chocolate    Patient Measurements: Height: '5\' 2"'$  (157.5 cm) Weight: 57 kg (125 lb 10.6 oz) IBW/kg (Calculated) : 50.1   Vital Signs: Temp: 99 F (37.2 C) (09/16 0630) Temp Source: Esophageal (09/15 2100) BP: 129/75 (09/16 0630) Pulse Rate: 121 (09/16 0630)  Labs: Recent Labs    12/21/2021 0446 12/14/2021 0455 12/23/2021 0635 12/07/2021 1311 11/28/2021 1547 12/02/2021 1634 12/02/2021 1844 17-Dec-2021 0439 Dec 17, 2021 0536  HGB 11.3*  --   --   --   --  12.6  --  11.9* 12.4  HCT 37.7  --   --   --   --  37.0  --  35.0* 35.9*  PLT 184  --   --   --   --   --   --   --  185  APTT  --   --   --   --   --   --  45*  --  69*  HEPARINUNFRC  --   --   --   --   --   --  >1.10*  --  >1.10*  CREATININE  --  2.07*  --   --   --   --  2.93*  --  3.16*  TROPONINIHS 39*  --  129* 318* 325*  --   --   --   --      Estimated Creatinine Clearance: 10.7 mL/min (A) (by C-G formula based on SCr of 3.16 mg/dL (H)).   Medical History: Past Medical History:  Diagnosis Date   Anemia    Anxiety    Arthritis    Asthma    Atrial fibrillation ([place])    a. s/p unsuccessful DCCV in 01/2017 and intolerant to Amiodarone --> Rate-control strategy pursued since b. failed Tikosyn in 11/2017   Breast cancer ([place]) 2001   Right   CHF (congestive heart failure) ([facility])    Chronic kidney disease    Essential hypertension    GERD (gastroesophageal reflux disease)    T10 vertebral fracture ([facility])      Assessment: 81yof admitted s/p cardiac arrest now  intubated and sedated  Chronic afib HR 70s on apixaban PTA - last dose 9/14 am  Cbc stable no bleeding noted, will begin heaprin drip while apixaban on hold  Apixaban can falsely elevated heparin level will dose via aptt until aptt and heparin level correlate  Heparin level tonight falsely elevated as expected.  Aptt within goal at 69.  No overt bleeding or complications noted.  Goal of Therapy:  aPTT 66-103 seconds Monitor platelets by anticoagulation protocol: Yes   Plan:  Continue heparin gtt to 550 units/hr. Aptt in 8 hrs. Daily cbc heparin level and aptt Monitor s/s bleeding   Alanda Slim, PharmD, [facility] Clinical Pharmacist Please see AMION for all Pharmacists' Contact Phone Numbers 2021/12/17, 6:43 AM

## 2021-12-24 NOTE — Progress Notes (Signed)
eLink Physician-Brief Progress Note Patient Name: TANEKIA RYANS DOB: Sep 26, 1940 MRN: [id number]   Date of Service  12-31-21  HPI/Events of Note  ABG on 40%/PRVC 18/TV 400/P 6 = 7.538/28.6/135/24.3  eICU Interventions  Plan: D/C NaHCO3 IV infusion. Decrease PRVC rate to 14. Repeat ABG at 8 AM.     Intervention Category Major Interventions: Acid-Base disturbance - evaluation and management;Respiratory failure - evaluation and management  Daune Divirgilio Cornelia Copa 31-Dec-2021, 4:55 AM

## 2021-12-24 DEATH — deceased

## 2022-03-29 ENCOUNTER — Other Ambulatory Visit (HOSPITAL_COMMUNITY): Payer: Self-pay

## 2022-04-13 IMAGING — CT CT CHEST W/O CM
2 of 4 series · 15 of 36 positions shown, 18 images · non-contrast
Comparison: Radiograph 09/03/2019.

CLINICAL DATA: Chronic dyspnea. Increased shortness of breath with
exertion.

EXAM:
CT CHEST WITHOUT CONTRAST
TECHNIQUE: Multidetector CT imaging of the chest was performed following the
standard protocol without IV contrast.

[Series 2: routine chest without · axial · non-contrast · 0.68mm/px · z∈[-395,-153]mm · 12 of 143 slices shown, 15 images]
[im 11/143  mediastinal]
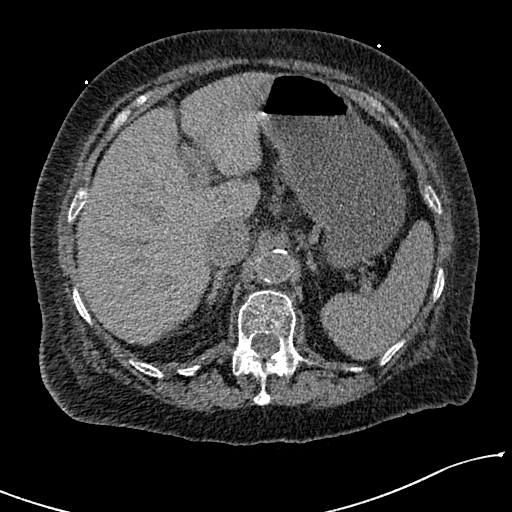
[im 11/143  lung]
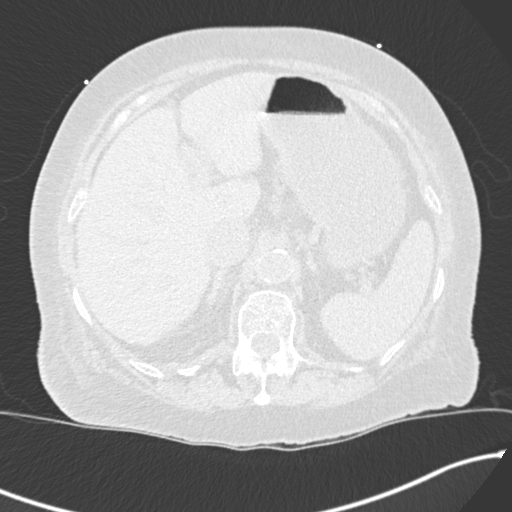
[im 22/143  lung]
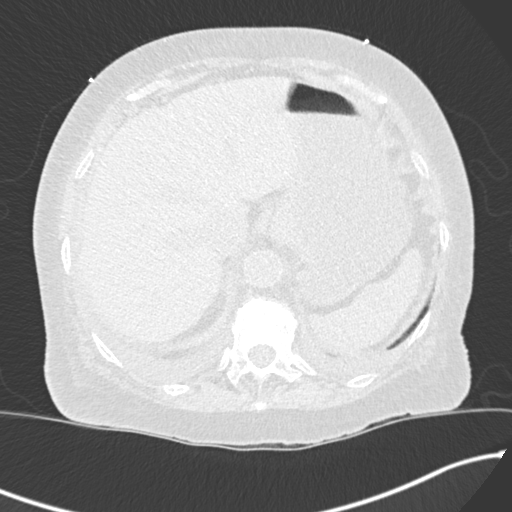
[im 33/143  lung]
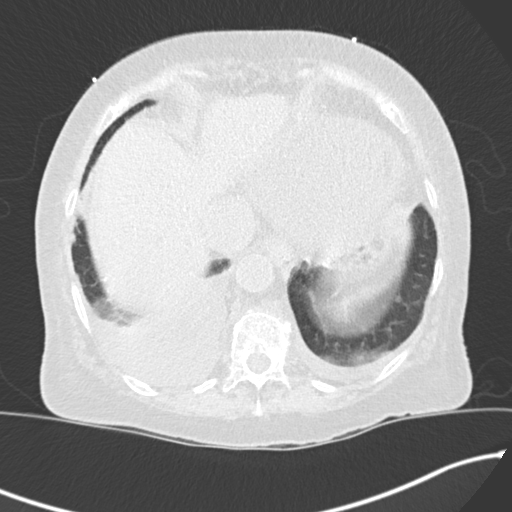
[im 44/143  lung]
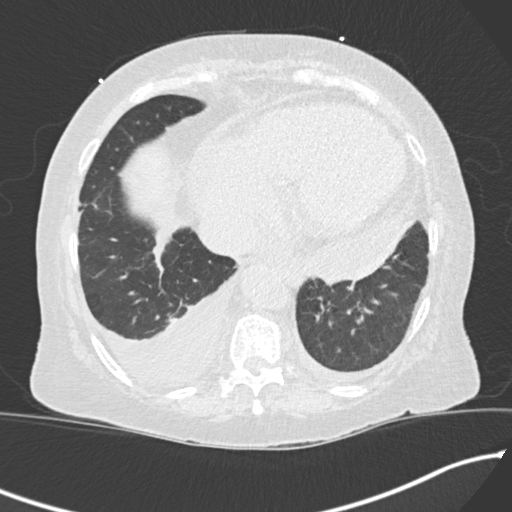
[im 55/143  mediastinal]
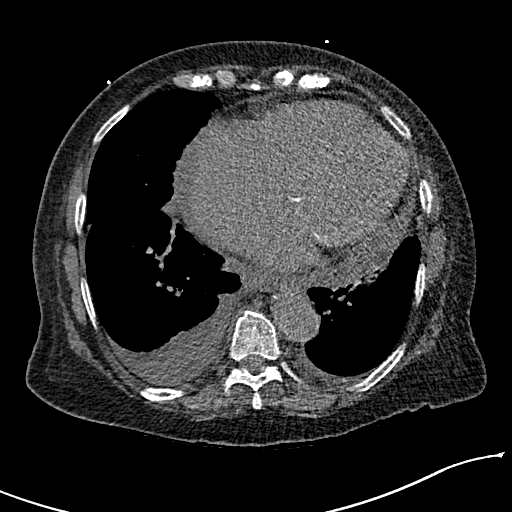
[im 55/143  lung]
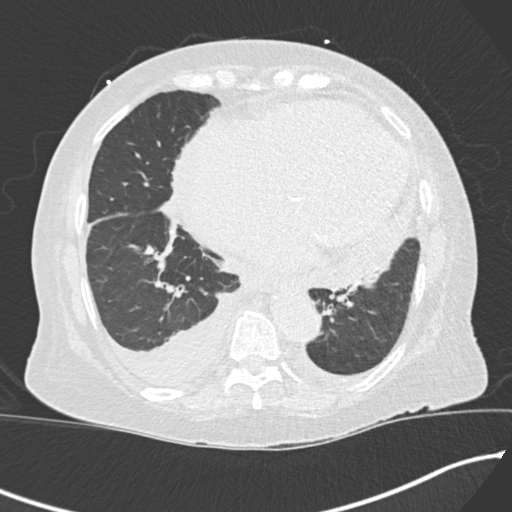
[im 66/143  lung]
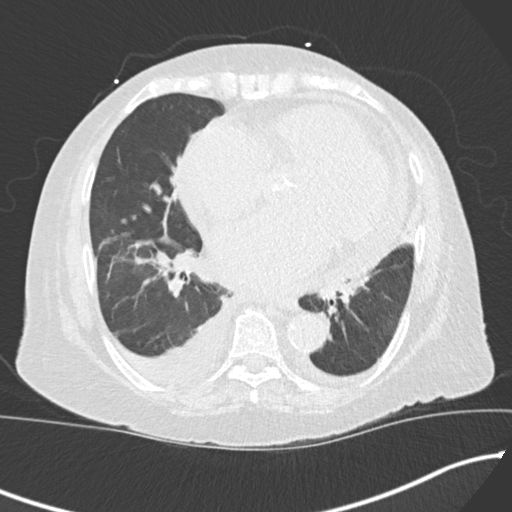
[im 77/143  lung]
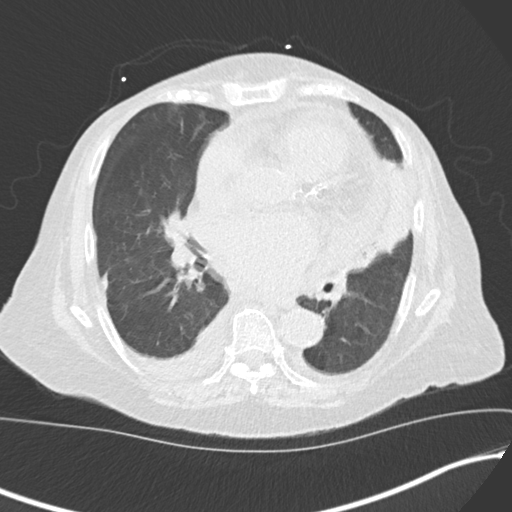
[im 88/143  lung]
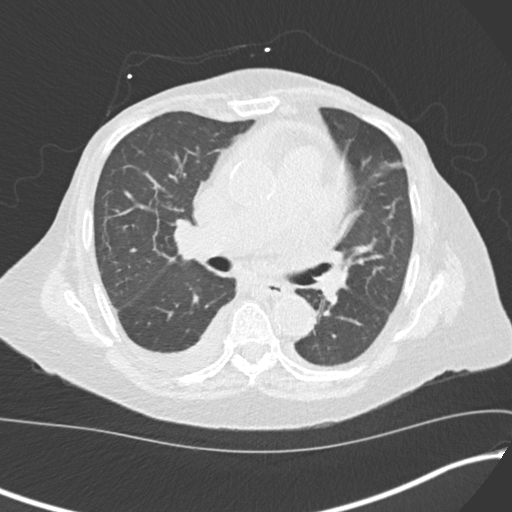
[im 99/143  mediastinal]
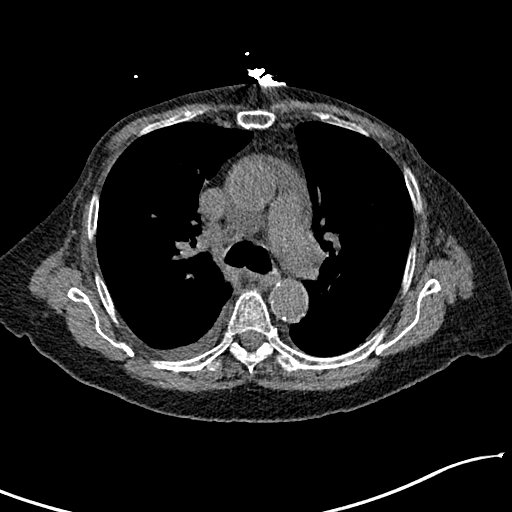
[im 99/143  lung]
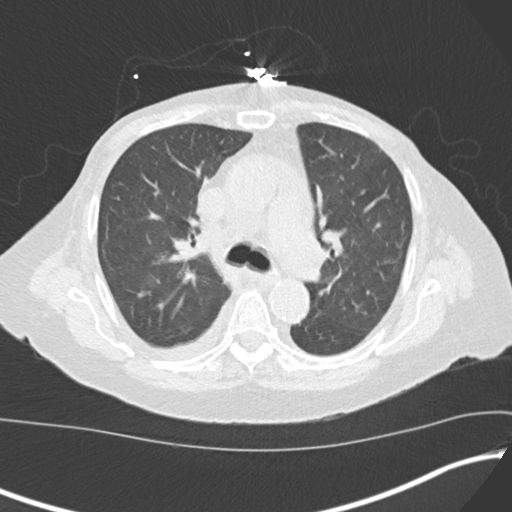
[im 110/143  lung]
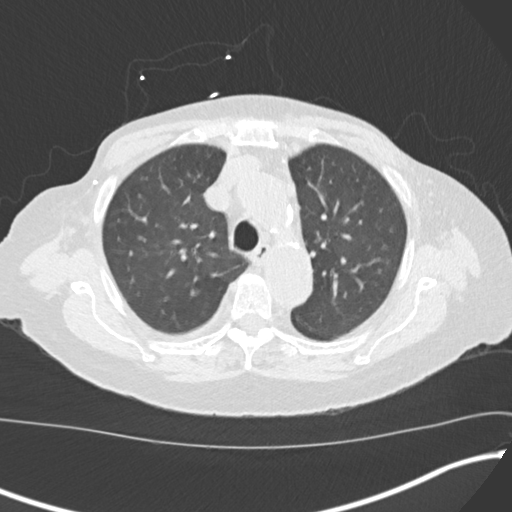
[im 121/143  lung]
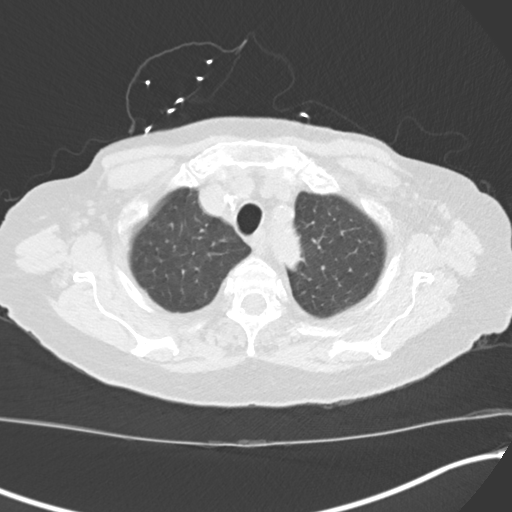
[im 132/143  lung]
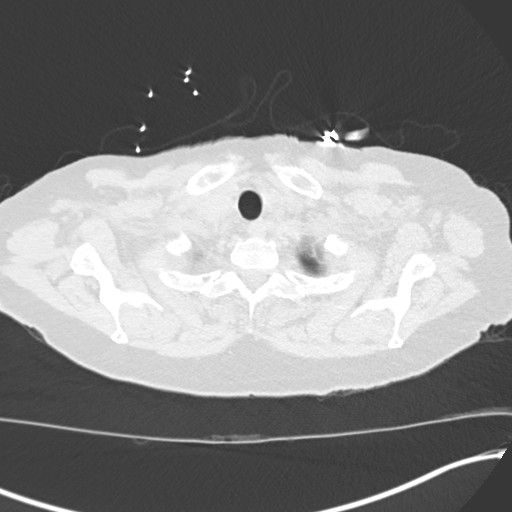

[Series 5: coronal · coronal · 0.62mm/px · 3 of 147 slices shown]
[im 30/147  lung]
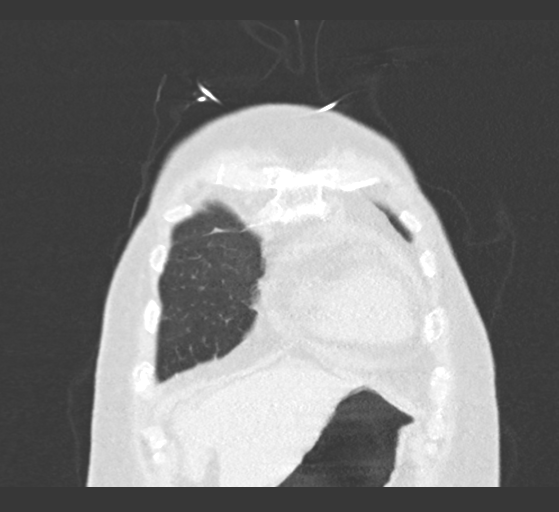
[im 59/147  lung]
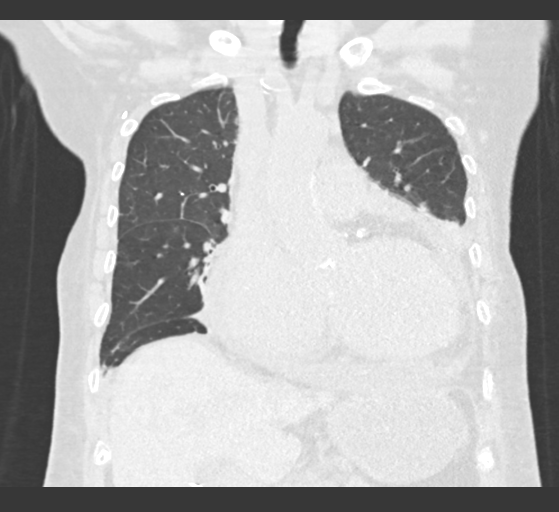
[im 88/147  lung]
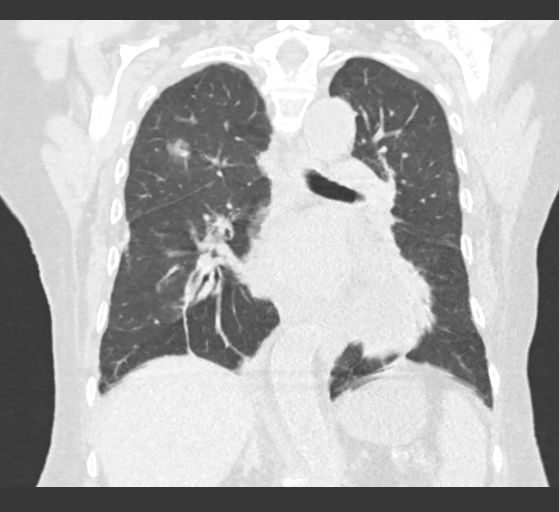

[15 of 36 positions shown; findings below may reference images not displayed]

FINDINGS: Cardiovascular: Aortic atherosclerosis. No aortic aneurysm. There is
multi chamber cardiomegaly. Minimal mitral annulus and coronary
artery calcifications. There is a small pericardial effusion,
greatest inferiorly measuring up to 17 mm in depth.

Mediastinum/Nodes: No enlarged mediastinal lymph nodes. No bulky
hilar adenopathy, evaluation limited in the absence of IV contrast.
Heterogeneously enlarged thyroid gland without discrete nodule. No
esophageal wall thickening. Surgical clips in the right axilla
without axillary adenopathy. No enlarged left axillary lymph nodes.

Lungs/Pleura: Small bilateral pleural effusions, right greater than
left. Associated compressive atelectasis. Streaky perifissural
opacities in the right lower lobe may represent atelectasis,
scarring, or small amount of fluid in the fissure. Compressive
atelectasis in the lingula related to cardiomegaly. Right upper lobe
12 mm ground-glass nodule, series 4, image 40. No septal thickening
or pulmonary edema. Mild motion artifact limits assessment of the
mainstem bronchi, no obvious intraluminal debris.

Upper Abdomen: Subcentimeter hypodensity in the right hepatic dome,
nonspecific. The stomach is distended with intraluminal fluid.

Musculoskeletal: T12 compression fracture with greater than 50% loss
of height anteriorly and posterior cortex involvement has progressed
in severity from thoracic spine CT 06/06/2019. Unchanged T10 and T11
vertebral body compression fractures.
IMPRESSION: 1. Small bilateral pleural effusions, right greater than left, with
compressive atelectasis.
2. Streaky perifissural opacities in the right lower lobe may
represent atelectasis, scarring, or small amount of fluid in the
fissure.
3. Right upper lobe 12 mm ground-glass nodule. Follow-up
non-contrast CT recommended at 3-6 months to confirm persistence. If
unchanged, and solid component remains <6 mm, annual CT is
recommended until 5 years of stability has been established. If
persistent these nodules should be considered highly suspicious if
the solid component of the nodule is 6 mm or greater in size and
enlarging. This recommendation follows the consensus statement:
Guidelines for Management of Incidental Pulmonary Nodules Detected
[DATE].
4. Multi chamber cardiomegaly.  Small pericardial effusion.
5. Compression fracture of T12 has progressed in severity from May 2019 CT, now with posterior cortex involvement. Unchanged T10 and
T11 vertebral body compression fractures.

Aortic Atherosclerosis ([id number]).
